# Patient Record
Sex: Male | Born: 1935
Health system: Southern US, Community
[De-identification: ages and names within clinical notes are randomized; demographics above are authoritative.]

## PROBLEM LIST (undated history)

## (undated) DIAGNOSIS — N400 Enlarged prostate without lower urinary tract symptoms: Secondary | ICD-10-CM

## (undated) DIAGNOSIS — N419 Inflammatory disease of prostate, unspecified: Secondary | ICD-10-CM

## (undated) DIAGNOSIS — Z972 Presence of dental prosthetic device (complete) (partial): Secondary | ICD-10-CM

## (undated) DIAGNOSIS — J45909 Unspecified asthma, uncomplicated: Secondary | ICD-10-CM

## (undated) DIAGNOSIS — K219 Gastro-esophageal reflux disease without esophagitis: Secondary | ICD-10-CM

## (undated) DIAGNOSIS — N390 Urinary tract infection, site not specified: Secondary | ICD-10-CM

## (undated) DIAGNOSIS — N411 Chronic prostatitis: Secondary | ICD-10-CM

## (undated) DIAGNOSIS — H18429 Band keratopathy, unspecified eye: Secondary | ICD-10-CM

## (undated) DIAGNOSIS — M1710 Unilateral primary osteoarthritis, unspecified knee: Secondary | ICD-10-CM

## (undated) DIAGNOSIS — I639 Cerebral infarction, unspecified: Secondary | ICD-10-CM

## (undated) DIAGNOSIS — I10 Essential (primary) hypertension: Secondary | ICD-10-CM

## (undated) DIAGNOSIS — G4733 Obstructive sleep apnea (adult) (pediatric): Secondary | ICD-10-CM

## (undated) DIAGNOSIS — N189 Chronic kidney disease, unspecified: Secondary | ICD-10-CM

## (undated) DIAGNOSIS — C4490 Unspecified malignant neoplasm of skin, unspecified: Secondary | ICD-10-CM

## (undated) DIAGNOSIS — H6690 Otitis media, unspecified, unspecified ear: Secondary | ICD-10-CM

## (undated) DIAGNOSIS — M25622 Stiffness of left elbow, not elsewhere classified: Secondary | ICD-10-CM

## (undated) DIAGNOSIS — E785 Hyperlipidemia, unspecified: Secondary | ICD-10-CM

## (undated) DIAGNOSIS — I4892 Unspecified atrial flutter: Secondary | ICD-10-CM

## (undated) DIAGNOSIS — I495 Sick sinus syndrome: Secondary | ICD-10-CM

## (undated) HISTORY — PX: BELPHAROPTOSIS REPAIR: SHX369

## (undated) HISTORY — PX: OTHER SURGICAL HISTORY: SHX169

## (undated) HISTORY — DX: Urinary tract infection, site not specified: N39.0

## (undated) HISTORY — DX: Inflammatory disease of prostate, unspecified: N41.9

## (undated) HISTORY — DX: Gastro-esophageal reflux disease without esophagitis: K21.9

## (undated) HISTORY — DX: Benign prostatic hyperplasia without lower urinary tract symptoms: N40.0

## (undated) HISTORY — DX: Unspecified atrial flutter: I48.92

## (undated) HISTORY — DX: Sick sinus syndrome: I49.5

## (undated) HISTORY — DX: Unilateral primary osteoarthritis, unspecified knee: M17.10

## (undated) HISTORY — DX: Essential (primary) hypertension: I10

## (undated) HISTORY — DX: Hyperlipidemia, unspecified: E78.5

## (undated) HISTORY — DX: Unspecified malignant neoplasm of skin, unspecified: C44.90

## (undated) HISTORY — DX: Chronic prostatitis: N41.1

## (undated) HISTORY — DX: Otitis media, unspecified, unspecified ear: H66.90

## (undated) HISTORY — PX: APPENDECTOMY: SHX54

## (undated) HISTORY — DX: Obstructive sleep apnea (adult) (pediatric): G47.33

## (undated) HISTORY — DX: Band keratopathy, unspecified eye: H18.429

## (undated) HISTORY — PX: VENOUS ABLATION: SHX2656

## (undated) HISTORY — PX: FRACTURE SURGERY: SHX138

## (undated) HISTORY — DX: Unspecified asthma, uncomplicated: J45.909

## (undated) HISTORY — PX: EYE SURGERY: SHX253

---

## 1996-01-04 HISTORY — PX: KNEE SURGERY: SHX244

## 2003-01-04 HISTORY — PX: SUBACROMIAL DECOMPRESSION: SHX5174

## 2004-01-04 LAB — HM COLONOSCOPY

## 2004-07-18 ENCOUNTER — Encounter: Payer: Self-pay | Admitting: Internal Medicine

## 2005-01-03 DIAGNOSIS — I4892 Unspecified atrial flutter: Secondary | ICD-10-CM

## 2005-01-03 HISTORY — DX: Unspecified atrial flutter: I48.92

## 2006-08-04 HISTORY — PX: MASTOIDECTOMY: SHX711

## 2007-01-04 HISTORY — PX: CATARACT EXTRACTION, BILATERAL: SHX1313

## 2008-05-03 HISTORY — PX: RHINOPLASTY: SUR1284

## 2009-10-01 ENCOUNTER — Encounter: Payer: Self-pay | Admitting: Cardiovascular Disease

## 2009-11-18 ENCOUNTER — Encounter: Payer: Self-pay | Admitting: Cardiovascular Disease

## 2010-03-18 ENCOUNTER — Encounter: Payer: Self-pay | Admitting: Cardiovascular Disease

## 2010-03-18 LAB — PROTIME-INR

## 2010-04-13 HISTORY — PX: CARDIOVERSION: SHX1299

## 2010-04-30 ENCOUNTER — Encounter: Payer: Self-pay | Admitting: Cardiovascular Disease

## 2010-04-30 LAB — PROTIME-INR

## 2010-06-03 ENCOUNTER — Encounter: Payer: Self-pay | Admitting: Cardiovascular Disease

## 2010-06-03 LAB — PROTIME-INR

## 2010-06-15 ENCOUNTER — Encounter: Payer: Self-pay | Admitting: Cardiovascular Disease

## 2010-06-15 LAB — PROTIME-INR

## 2010-06-25 ENCOUNTER — Encounter: Payer: Self-pay | Admitting: Internal Medicine

## 2010-06-25 ENCOUNTER — Ambulatory Visit (INDEPENDENT_AMBULATORY_CARE_PROVIDER_SITE_OTHER): Payer: Medicare Other | Admitting: Internal Medicine

## 2010-06-25 DIAGNOSIS — N401 Enlarged prostate with lower urinary tract symptoms: Secondary | ICD-10-CM | POA: Insufficient documentation

## 2010-06-25 DIAGNOSIS — I10 Essential (primary) hypertension: Secondary | ICD-10-CM

## 2010-06-25 DIAGNOSIS — M1710 Unilateral primary osteoarthritis, unspecified knee: Secondary | ICD-10-CM

## 2010-06-25 DIAGNOSIS — I495 Sick sinus syndrome: Secondary | ICD-10-CM | POA: Insufficient documentation

## 2010-06-25 DIAGNOSIS — E119 Type 2 diabetes mellitus without complications: Secondary | ICD-10-CM

## 2010-06-25 DIAGNOSIS — N138 Other obstructive and reflux uropathy: Secondary | ICD-10-CM | POA: Insufficient documentation

## 2010-06-25 DIAGNOSIS — J45909 Unspecified asthma, uncomplicated: Secondary | ICD-10-CM | POA: Insufficient documentation

## 2010-06-25 DIAGNOSIS — R7301 Impaired fasting glucose: Secondary | ICD-10-CM

## 2010-06-25 DIAGNOSIS — H6690 Otitis media, unspecified, unspecified ear: Secondary | ICD-10-CM

## 2010-06-25 DIAGNOSIS — N411 Chronic prostatitis: Secondary | ICD-10-CM

## 2010-06-25 DIAGNOSIS — H65499 Other chronic nonsuppurative otitis media, unspecified ear: Secondary | ICD-10-CM

## 2010-06-25 DIAGNOSIS — K219 Gastro-esophageal reflux disease without esophagitis: Secondary | ICD-10-CM | POA: Insufficient documentation

## 2010-06-25 LAB — CBC WITH DIFFERENTIAL/PLATELET
Basophils Absolute: 0 10*3/uL (ref 0.0–0.1)
Basophils Relative: 1 % (ref 0–1)
Eosinophils Absolute: 0.4 10*3/uL (ref 0.0–0.7)
Eosinophils Relative: 4 % (ref 0–5)
HCT: 47.4 % (ref 39.0–52.0)
Hemoglobin: 16.3 g/dL (ref 13.0–17.0)
Lymphocytes Relative: 17 % (ref 12–46)
Lymphs Abs: 1.5 10*3/uL (ref 0.7–4.0)
MCH: 31 pg (ref 26.0–34.0)
MCHC: 34.4 g/dL (ref 30.0–36.0)
MCV: 90.1 fL (ref 78.0–100.0)
Monocytes Absolute: 1.4 10*3/uL — ABNORMAL HIGH (ref 0.1–1.0)
Monocytes Relative: 16 % — ABNORMAL HIGH (ref 3–12)
Neutro Abs: 5.4 10*3/uL (ref 1.7–7.7)
Neutrophils Relative %: 62 % (ref 43–77)
Platelets: 138 10*3/uL — ABNORMAL LOW (ref 150–400)
RBC: 5.26 MIL/uL (ref 4.22–5.81)
RDW: 12.7 % (ref 11.5–15.5)
WBC: 8.8 10*3/uL (ref 4.0–10.5)

## 2010-06-25 LAB — HEMOGLOBIN A1C
Hgb A1c MFr Bld: 7.1 % — ABNORMAL HIGH (ref <5.7)
Mean Plasma Glucose: 157 mg/dL — ABNORMAL HIGH (ref <117)

## 2010-06-25 NOTE — Patient Instructions (Signed)
Please get your protime test for coumadin when you see Dr Mariah Milling

## 2010-06-25 NOTE — Progress Notes (Signed)
Subjective:    Patient ID: Michael Colon, male    DOB: 1930-10-02, 77 y.o.   MRN: [id number]  HPI Establishing here Just moved to [facility] from [place] area about 1 [place] ago [place]  Will be seeing Dr Mariah Milling Has tachy-brady syndrome Cardioverted after bradycardia, then tachy Atrial flutter at first so started on coumadin Being considered for pacemaker  HTN in past Has been controlled ---at first by 2 drugs Now just diuretic  Some GERD---gets laryngeal reflux Hoarseness and change in voice in past---okay with nighttime dosing only  History of asthma with occ flareups Has rescue inhaler of albuterol and advair in the past (like if he gets a cold and bronchitis flares)  Chronic prostatis in past Recurrent need for levaquin PSA spike to just over 10, had biopsy then which was negative  Broke left knee after fall14 years ago Repaired with plate but then had to remove it due to infection Repeat procedures--last 2010 Limits his moblity  No current outpatient prescriptions on file prior to visit.   Past Medical History  Diagnosis Date  . Tachy-brady syndrome   . Chronic prostatitis   . Obstructive sleep apnea     CPAP-9  . Chronic ear infection   . Asthma   . GERD (gastroesophageal reflux disease)     laryngeal involvement  . Hypertension   . Osteoarthrosis, localized, primary, knee     post-traumatic    Past Surgical History  Procedure Date  . Mastoidectomy 8/08    Dr [name]  . Rhinoplasty 5/10    and septoplasty  . Cataract extraction, bilateral 2009  . Belpharoptosis repair     Dr [name]---didn't resolve weepy eye and eyelid drooping  . Knee surgery 1998    plate after fracture, then removed for infection    No family history on file.  History   Social History  . Marital Status: Married    Spouse Name: N/A    Number of Children: 2  . Years of Education: N/A   Occupational History  . 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 Smoking status: Former Smoker    Quit date: 01/03/1969  . Smokeless tobacco: Never Used  . Alcohol Use: Yes     rare wine  . Drug Use: No  . Sexually Active: Not on file   Other Topics Concern  . Not on file   Social History Narrative   Has living willHealth care POA-- wife and then son Bryson Dames DNR order from the past---form redone 6/22/12Not sure about tube feedings   Review of Systems  Constitutional: Negative for fatigue and unexpected weight change.       Has lost about 10# in the recent move Wears seat belt  HENT: Positive for hearing loss and dental problem. Negative for congestion and rhinorrhea.        Just spent $21K for bridge  Eyes: Positive for discharge. Negative for visual disturbance.       No diplopia or focal vision loss  Respiratory: Negative for cough, chest tightness and shortness of breath.   Cardiovascular: Positive for palpitations and leg swelling. Negative for chest pain.  Gastrointestinal: Positive for constipation. Negative for nausea, vomiting and blood in stool.       Takes stool softener nightly No sig heartburn on Rx  Genitourinary: Positive for urgency. Negative for frequency and difficulty urinating.       Nocturia 1-2  Musculoskeletal: Positive for back pain  and arthralgias.       Feet have been sore---even good right foot lately Wears orthotics   Skin: Positive for rash.       Establishing with Dr Purcell Nails  Neurological: Negative for dizziness, syncope, weakness, light-headedness and headaches.  Hematological: Negative for adenopathy. Does not bruise/bleed easily.  Psychiatric/Behavioral: Negative for sleep disturbance and dysphoric mood. The patient is not nervous/anxious.        Objective:   Physical Exam  Constitutional: He is oriented to person, place, and time. He appears well-developed and well-nourished. No distress.  HENT:  Head: Normocephalic and atraumatic.  Left Ear: External ear normal.    Mouth/Throat: Oropharynx is clear and moist. No oropharyngeal exudate.       TM abnormal --opaque but no sig inflammation  Eyes: EOM are normal. Pupils are equal, round, and reactive to light.  Neck: Normal range of motion. Neck supple. No thyromegaly present.  Cardiovascular: Normal rate, regular rhythm, normal heart sounds and intact distal pulses.  Exam reveals no gallop.   No murmur heard.      Faint distal pulses  Pulmonary/Chest: Effort normal and breath sounds normal. No respiratory distress. He has no wheezes. He has no rales.  Abdominal: Soft. He exhibits no mass. There is no tenderness.  Musculoskeletal: Normal range of motion.       Puffy in ankles without pitting Mild redness along right instep with slight tenderness  Lymphadenopathy:    He has no cervical adenopathy.  Neurological: He is alert and oriented to person, place, and time. He exhibits normal muscle tone. Coordination normal.  Skin: Skin is warm and dry.  Psychiatric: He has a normal mood and affect. His behavior is normal. Judgment and thought content normal.          Assessment & Plan:

## 2010-06-25 NOTE — Assessment & Plan Note (Signed)
Somewhat disabling Handicapped form done No meds currently--will use ibuprofen prn

## 2010-06-25 NOTE — Assessment & Plan Note (Signed)
Good control No changes Will check labs

## 2010-06-25 NOTE — Assessment & Plan Note (Signed)
With LPR Does okay with bedtime famotidine

## 2010-06-25 NOTE — Assessment & Plan Note (Signed)
Advised no more PSAs Uses levaquin for infections when recur

## 2010-06-25 NOTE — Assessment & Plan Note (Signed)
Abnormal TM Recurrent infections Will probably establish with ENT here

## 2010-06-25 NOTE — Assessment & Plan Note (Signed)
With original atrial flutter Being considered for pacer but seems okay right now Will let Dr Mariah Milling reconsider need for coumadin

## 2010-06-25 NOTE — Assessment & Plan Note (Signed)
Generally quiet unless respiratory infection

## 2010-06-25 NOTE — Assessment & Plan Note (Signed)
Has been over 126 Will send for counselling if still that high

## 2010-06-26 LAB — BASIC METABOLIC PANEL
BUN: 22 mg/dL (ref 6–23)
CO2: 30 mEq/L (ref 19–32)
Calcium: 11 mg/dL — ABNORMAL HIGH (ref 8.4–10.5)
Chloride: 99 mEq/L (ref 96–112)
Creat: 0.87 mg/dL (ref 0.50–1.35)
Glucose, Bld: 165 mg/dL — ABNORMAL HIGH (ref 70–99)
Potassium: 4.2 mEq/L (ref 3.5–5.3)
Sodium: 138 mEq/L (ref 135–145)

## 2010-06-26 LAB — LIPID PANEL
Cholesterol: 191 mg/dL (ref 0–200)
HDL: 52 mg/dL (ref >39)
LDL Cholesterol: 96 mg/dL (ref 0–99)
Total CHOL/HDL Ratio: 3.7 Ratio
Triglycerides: 215 mg/dL — ABNORMAL HIGH (ref <150)
VLDL: 43 mg/dL — ABNORMAL HIGH (ref 0–40)

## 2010-06-26 LAB — HEPATIC FUNCTION PANEL
ALT: 16 U/L (ref 0–53)
AST: 20 U/L (ref 0–37)
Albumin: 3.9 g/dL (ref 3.5–5.2)
Alkaline Phosphatase: 67 U/L (ref 39–117)
Bilirubin, Direct: 0.1 mg/dL (ref 0.0–0.3)
Indirect Bilirubin: 0.6 mg/dL (ref 0.0–0.9)
Total Bilirubin: 0.7 mg/dL (ref 0.3–1.2)
Total Protein: 6.7 g/dL (ref 6.0–8.3)

## 2010-06-28 ENCOUNTER — Encounter: Payer: Self-pay | Admitting: Internal Medicine

## 2010-06-28 DIAGNOSIS — E1140 Type 2 diabetes mellitus with diabetic neuropathy, unspecified: Secondary | ICD-10-CM | POA: Insufficient documentation

## 2010-06-28 NOTE — Progress Notes (Signed)
Addended by: Tillman Abide I on: 06/28/2010 01:55 PM   Modules accepted: Orders

## 2010-06-28 NOTE — Progress Notes (Signed)
Referral to [facility] Referral faxed today.  cdavis 06-28-2010

## 2010-06-30 ENCOUNTER — Encounter: Payer: Self-pay | Admitting: *Deleted

## 2010-07-05 ENCOUNTER — Encounter: Payer: Self-pay | Admitting: Cardiovascular Disease

## 2010-07-05 ENCOUNTER — Ambulatory Visit (INDEPENDENT_AMBULATORY_CARE_PROVIDER_SITE_OTHER): Payer: Medicare Other | Admitting: Cardiovascular Disease

## 2010-07-05 DIAGNOSIS — E119 Type 2 diabetes mellitus without complications: Secondary | ICD-10-CM

## 2010-07-05 DIAGNOSIS — Z7901 Long term (current) use of anticoagulants: Secondary | ICD-10-CM

## 2010-07-05 DIAGNOSIS — E785 Hyperlipidemia, unspecified: Secondary | ICD-10-CM

## 2010-07-05 DIAGNOSIS — I10 Essential (primary) hypertension: Secondary | ICD-10-CM

## 2010-07-05 DIAGNOSIS — I4891 Unspecified atrial fibrillation: Secondary | ICD-10-CM

## 2010-07-05 DIAGNOSIS — I495 Sick sinus syndrome: Secondary | ICD-10-CM

## 2010-07-05 NOTE — Assessment & Plan Note (Signed)
We have recommended that he try to lose weight over the next several months with a check of his cholesterol at that time. If he is able to lose weight, we could hold off on starting a cholesterol medication.

## 2010-07-05 NOTE — Progress Notes (Signed)
   Patient ID: Michael Colon, male    DOB: 05-17-1930, 77 y.o.   MRN: [id number]  HPI Comments: Michael Colon is a very pleasant 77 year old gentleman with a history of atrial flutter/fibrillation, who recently moved to the area who presents to establish care. He has sleep apnea and wears CPAP, has obesity, hypertension. Notes indicate a history of sick sinus syndrome and bradycardia. He was previously followed at [facility] cardiology by Dr. Bari Edward. Symptoms of atrial fibrillation started back in 2006. Bradycardia has been documented on Holter monitor since then. He has had a cardioversion April of this year with subsequent EKG showing bradycardia.  Most recent note from [facility] cardiology from February indicates that he had a cardioversion April 13, 2010 for atrial flutter with 2:1 conduction. Notes from February indicate that he had an atrial tachycardia and cardioversion was discussed. No medications were started at that time although antiarrhythmics have been discussed. He was about to move and further medication changes were delayed.  Since he has moved, has felt well. He denies any lightheadedness or dizziness. He is somewhat sleepy during the daytime. He exercises daily and carries a pedometer and tries to achieve 10,000 steps per day.  Previous echocardiogram showed normal systolic function, stress test showed no significant ischemia with adequate heart rate response to treadmill. The studies were done in September 2011.  EKG shows atrial fibrillation with ventricular rate 72 beats per minute, right bundle branch block      Review of Systems  Constitutional: Positive for fatigue.  HENT: Negative.   Eyes: Negative.   Respiratory: Negative.   Cardiovascular: Negative.   Gastrointestinal: Negative.   Musculoskeletal: Negative.   Skin: Negative.   Neurological: Negative.   Hematological: Negative.   Psychiatric/Behavioral: Negative.   All other systems reviewed and are  negative.    BP 118/82  Pulse 72  Ht 5\' 11"  (1.803 m)  Wt 235 lb (106.595 kg)  BMI 32.78 kg/m2  Physical Exam  Nursing note and vitals reviewed. Constitutional: He is oriented to person, place, and time. He appears well-developed and well-nourished.       obese  HENT:  Head: Normocephalic.  Nose: Nose normal.  Mouth/Throat: Oropharynx is clear and moist.  Eyes: Conjunctivae are normal. Pupils are equal, round, and reactive to light.  Neck: Normal range of motion. Neck supple. No JVD present.  Cardiovascular: Normal rate, regular rhythm, S1 normal, S2 normal, normal heart sounds and intact distal pulses.  Exam reveals no gallop and no friction rub.   No murmur heard. Pulmonary/Chest: Effort normal and breath sounds normal. No respiratory distress. He has no wheezes. He has no rales. He exhibits no tenderness.  Abdominal: Soft. Bowel sounds are normal. He exhibits no distension. There is no tenderness.  Musculoskeletal: Normal range of motion. He exhibits no edema and no tenderness.       Well-healed scars on his left leg below the knee, trace edema nonpitting  Lymphadenopathy:    He has no cervical adenopathy.  Neurological: He is alert and oriented to person, place, and time. Coordination normal.  Skin: Skin is warm and dry. No rash noted. No erythema.  Psychiatric: He has a normal mood and affect. His behavior is normal. Judgment and thought content normal.           Assessment and Plan

## 2010-07-05 NOTE — Assessment & Plan Note (Addendum)
6 years of  tachyarrhythmias including atrial fibrillation and flutter. Currently is maintaining atrial fibrillation with no significant symptoms. Have asked him to monitor his heart rate at home with his portable monitor. He denies any recent bradycardia, rates in the 60s to 70s. His energy is adequate and he is able to exercise though he does sleep during the daytime. I suggested given his problems with chronic tachyarrhythmia, I would not plan for any antiarrhythmics or cardioversion at this time. He is on warfarin.  INR in March was 2.4, 2.2 in April 27, 1.4 in May 31, 2.1 on June 12

## 2010-07-05 NOTE — Patient Instructions (Addendum)
You are doing well. No medication changes were made. Please call us if you have new issues that need to be addressed before your next appt.   You are scheduled with our coumadin clinic 07/21/10 @ 9:00 AM We will call you for a follow up Appt. In 6 months

## 2010-07-05 NOTE — Assessment & Plan Note (Signed)
Blood pressure is well controlled on today's visit. No changes made to the medications. 

## 2010-07-05 NOTE — Assessment & Plan Note (Signed)
We have encouraged continued exercise, careful diet management in an effort to lose weight. 

## 2010-07-09 ENCOUNTER — Ambulatory Visit: Payer: Self-pay | Admitting: Internal Medicine

## 2010-07-14 ENCOUNTER — Encounter: Payer: Medicare Other | Admitting: Emergency Medicine

## 2010-07-16 ENCOUNTER — Other Ambulatory Visit: Payer: Self-pay | Admitting: *Deleted

## 2010-07-16 MED ORDER — GLUCOSE BLOOD VI STRP: ORAL_STRIP | Status: AC

## 2010-07-16 MED ORDER — BAYER MICROLET LANCETS MISC: Status: AC

## 2010-07-21 ENCOUNTER — Ambulatory Visit (INDEPENDENT_AMBULATORY_CARE_PROVIDER_SITE_OTHER): Payer: Medicare Other | Admitting: Emergency Medicine

## 2010-07-21 DIAGNOSIS — I4892 Unspecified atrial flutter: Secondary | ICD-10-CM | POA: Insufficient documentation

## 2010-07-21 DIAGNOSIS — Z7901 Long term (current) use of anticoagulants: Secondary | ICD-10-CM

## 2010-07-21 DIAGNOSIS — Z7189 Other specified counseling: Secondary | ICD-10-CM | POA: Insufficient documentation

## 2010-07-21 HISTORY — DX: Other specified counseling: Z71.89

## 2010-07-21 LAB — POCT INR: INR: 2.7

## 2010-07-23 ENCOUNTER — Telehealth: Payer: Self-pay | Admitting: *Deleted

## 2010-07-23 NOTE — Telephone Encounter (Signed)
Asking to have another DNR filled out, they need an original for [facility]. Will pick up on Monday.

## 2010-07-26 NOTE — Telephone Encounter (Signed)
Left message on machine that DNR is ready for pick-up, and it will be at our front desk.

## 2010-07-26 NOTE — Telephone Encounter (Signed)
Written Please scan for chart and then okay for pickup

## 2010-08-04 ENCOUNTER — Encounter: Payer: Self-pay | Admitting: Cardiovascular Disease

## 2010-08-04 ENCOUNTER — Telehealth: Payer: Self-pay | Admitting: *Deleted

## 2010-08-04 ENCOUNTER — Observation Stay: Payer: Self-pay | Admitting: Internal Medicine

## 2010-08-04 DIAGNOSIS — R079 Chest pain, unspecified: Secondary | ICD-10-CM

## 2010-08-04 DIAGNOSIS — I4891 Unspecified atrial fibrillation: Secondary | ICD-10-CM

## 2010-08-04 HISTORY — PX: OTHER SURGICAL HISTORY: SHX169

## 2010-08-04 NOTE — Telephone Encounter (Signed)
Unfortunately, he needs to go to the ER. This would not be an appropriate visit for my clinic at [facility] as I am limited as to what I can do there. This may be cardiac related.

## 2010-08-04 NOTE — Telephone Encounter (Signed)
Patient's wife was advised at 9:35 a.m today.  I did not get it documented until now.

## 2010-08-04 NOTE — Telephone Encounter (Signed)
Patient's wife says patient got up this morning stating that he did not feel right.  He is hurting in his chest, up around his neck area and it seems to worsen when he bends over.  It started last night but he did not report it to her until this morning.  They are at [facility] and wish to see you this afternoon at the clinic but they can't reach anyone to schedule an appt there.  Should they call  911 or can they just walk in to the clinic this afternoon.  She says she doesn't think it warrants a 911 call but would like to know what you think.

## 2010-08-05 ENCOUNTER — Encounter: Payer: Self-pay | Admitting: Cardiovascular Disease

## 2010-08-06 ENCOUNTER — Telehealth: Payer: Self-pay | Admitting: *Deleted

## 2010-08-06 ENCOUNTER — Ambulatory Visit: Payer: Self-pay | Admitting: Internal Medicine

## 2010-08-06 MED ORDER — OMEPRAZOLE MAGNESIUM 20 MG PO TBEC: 20.0000 mg | DELAYED_RELEASE_TABLET | Freq: Every day | ORAL | Status: DC

## 2010-08-06 NOTE — Telephone Encounter (Signed)
Patient was recently discharged from the hospital and has a follow up appt with Dr. Alphonsus Sias in 2 weeks as recommended.  He was told to start Prilosec (delayed release) 20 mg daily.  It is cheaper for them to get this through a Rx rather than OTC.  Is it okay to send a Rx ?   They would like to get started with this before the 2 week follow up.   Uses [facility] [address].

## 2010-08-06 NOTE — Telephone Encounter (Signed)
Yes that is ok

## 2010-08-06 NOTE — Telephone Encounter (Signed)
Patients spouse advised as instructed via telephone.  Rx sent to pharmacy.

## 2010-08-17 ENCOUNTER — Encounter: Payer: Self-pay | Admitting: Internal Medicine

## 2010-08-17 ENCOUNTER — Ambulatory Visit (INDEPENDENT_AMBULATORY_CARE_PROVIDER_SITE_OTHER): Payer: Medicare Other | Admitting: Internal Medicine

## 2010-08-17 DIAGNOSIS — I10 Essential (primary) hypertension: Secondary | ICD-10-CM

## 2010-08-17 DIAGNOSIS — E1169 Type 2 diabetes mellitus with other specified complication: Secondary | ICD-10-CM

## 2010-08-17 DIAGNOSIS — E669 Obesity, unspecified: Secondary | ICD-10-CM

## 2010-08-17 DIAGNOSIS — E119 Type 2 diabetes mellitus without complications: Secondary | ICD-10-CM

## 2010-08-17 DIAGNOSIS — K219 Gastro-esophageal reflux disease without esophagitis: Secondary | ICD-10-CM

## 2010-08-17 NOTE — Assessment & Plan Note (Signed)
Apparent esophageal spasm Will continue PPI Will consider stopping famotidine at the next visit

## 2010-08-17 NOTE — Assessment & Plan Note (Signed)
Has done counselling Walking bid aggressively Will recheck labs at October visit Checks sugars every other day

## 2010-08-17 NOTE — Progress Notes (Signed)
Subjective:    Patient ID: Michael Colon, male    DOB: Mar 24, 1930, 77 y.o.   MRN: [id number]  HPI Hospitalized for upper chest pain Actually points to windpipe with pressure sensation and SOB Worse when bending over  Started on omperazole in hospital The sensation had abated by that time No recurrence of pain No SOB No water brash  Had reassuring stress test  Still finishing with diabetic training Has increased his work outs---walking a lot every day  Current Outpatient Prescriptions on File Prior to Visit  Medication Sig Dispense Refill  . albuterol (VENTOLIN HFA) 108 (90 BASE) MCG/ACT inhaler Inhale 2 puffs into the lungs every 6 (six) hours as needed.        Marland Kitchen aspirin 81 MG tablet Take 81 mg by mouth daily.        Marland Kitchen BAYER MICROLET LANCETS lancets Use as instructed  100 each  12  . Calcium Carbonate-Vitamin D (CALCIUM-CARB 600 + D) 600-125 MG-UNIT TABS Take by mouth.        . docusate sodium (COLACE) 100 MG capsule Take 100 mg by mouth 1 dose over 24 hours.       . famotidine (PEPCID) 20 MG tablet Take 20 mg by mouth 1 dose over 24 hours.       . finasteride (PROSCAR) 5 MG tablet Take 5 mg by mouth daily.        . fish oil-omega-3 fatty acids 1000 MG capsule Take 2 g by mouth daily.        . Fluticasone-Salmeterol (ADVAIR DISKUS) 100-50 MCG/DOSE AEPB Inhale 1 puff into the lungs every 12 (twelve) hours. To be used when bronchitis flaring up       . glucose blood (BAYER CONTOUR TEST) test strip Use as instructed  100 each  12  . loratadine (CLARITIN) 10 MG tablet Take 10 mg by mouth daily.        . Multiple Vitamins-Minerals (CENTRUM SILVER PO) Take by mouth daily after breakfast.        . omeprazole (PRILOSEC OTC) 20 MG tablet Take 1 tablet (20 mg total) by mouth daily.  30 tablet  0  . triamterene-hydrochlorothiazide (MAXZIDE) 75-50 MG per tablet Take 1 tablet by mouth daily.        Marland Kitchen warfarin (COUMADIN) 7.5 MG tablet Take 7.5 mg by mouth as directed.           Allergies  Allergen Reactions  . Latex Rash  . Lovenox Hives  . Percocet (Oxycodone-Acetaminophen) Rash    Past Medical History  Diagnosis Date  . Chronic prostatitis   . Obstructive sleep apnea     CPAP-9  . Chronic ear infection   . Asthma   . GERD (gastroesophageal reflux disease)     laryngeal involvement  . Hypertension   . Osteoarthrosis, localized, primary, knee     post-traumatic  . Diabetes mellitus   . Tachy-brady syndrome   . A-fib 2007    Past Surgical History  Procedure Date  . Mastoidectomy 8/08    Dr [name]  . Rhinoplasty 5/10    and septoplasty  . Cataract extraction, bilateral 2009  . Belpharoptosis repair     Dr [name]---didn't resolve weepy eye and eyelid drooping  . Knee surgery 1998    plate after fracture, then removed for infection  . Cardioversion 04/13/2010  . Chest pain 8/12    Stress test benign    No family history on file.  History   Social History  .  Marital Status: Married    Spouse Name: N/A    Number of Children: 2  . 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 Smoking status: Former Smoker -- 1.0 packs/day for 0 years    Types: Cigarettes    Quit date: 01/03/1969  . Smokeless tobacco: Never Used  . Alcohol Use: 0.6 oz/week    1 Glasses of wine per week     rare wine  . Drug Use: No  . Sexually Active: Not on file   Other Topics Concern  . Not on file   Social History Narrative   Has living willHealth care POA-- wife and then son Bryson Dames DNR order from the past---form redone 6/22/12Not sure about tube feedings   Review of Systems  Sleeping well No sig edema    Objective:   Physical Exam  Constitutional: He appears well-developed and well-nourished. No distress.  Neck: Normal range of motion. Neck supple. No thyromegaly present.  Cardiovascular: Normal rate, regular rhythm, normal heart sounds and intact distal pulses.  Exam  reveals no gallop.   No murmur heard. Pulmonary/Chest: Effort normal and breath sounds normal. No respiratory distress. He has no wheezes. He has no rales.  Abdominal: Soft. There is no tenderness.  Musculoskeletal: He exhibits edema.       Trace edema  Lymphadenopathy:    He has no cervical adenopathy.  Psychiatric: He has a normal mood and affect. His behavior is normal. Judgment and thought content normal.          Assessment & Plan:

## 2010-08-17 NOTE — Patient Instructions (Addendum)
Please cancel the 8/22 appointment but keep the October appt

## 2010-08-17 NOTE — Assessment & Plan Note (Signed)
BP Readings from Last 3 Encounters:  08/17/10 112/68  07/05/10 118/82  06/25/10 126/68   Good control No changes

## 2010-08-18 ENCOUNTER — Encounter: Payer: Medicare Other | Admitting: Emergency Medicine

## 2010-08-23 ENCOUNTER — Encounter: Payer: Self-pay | Admitting: Cardiovascular Disease

## 2010-08-25 ENCOUNTER — Ambulatory Visit: Payer: Medicare Other | Admitting: Internal Medicine

## 2010-09-01 ENCOUNTER — Ambulatory Visit (INDEPENDENT_AMBULATORY_CARE_PROVIDER_SITE_OTHER): Payer: Medicare Other | Admitting: Emergency Medicine

## 2010-09-01 DIAGNOSIS — I4891 Unspecified atrial fibrillation: Secondary | ICD-10-CM

## 2010-09-01 DIAGNOSIS — Z7901 Long term (current) use of anticoagulants: Secondary | ICD-10-CM

## 2010-09-01 LAB — POCT INR: INR: 2.5

## 2010-09-04 ENCOUNTER — Ambulatory Visit: Payer: Self-pay | Admitting: Internal Medicine

## 2010-09-29 ENCOUNTER — Encounter: Payer: Medicare Other | Admitting: Emergency Medicine

## 2010-09-29 ENCOUNTER — Ambulatory Visit (INDEPENDENT_AMBULATORY_CARE_PROVIDER_SITE_OTHER): Payer: Medicare Other | Admitting: Emergency Medicine

## 2010-09-29 DIAGNOSIS — I4891 Unspecified atrial fibrillation: Secondary | ICD-10-CM

## 2010-09-29 DIAGNOSIS — Z7901 Long term (current) use of anticoagulants: Secondary | ICD-10-CM

## 2010-09-29 LAB — POCT INR: INR: 2.2

## 2010-10-05 ENCOUNTER — Other Ambulatory Visit: Payer: Self-pay | Admitting: Family Medicine

## 2010-10-12 ENCOUNTER — Ambulatory Visit (INDEPENDENT_AMBULATORY_CARE_PROVIDER_SITE_OTHER): Payer: Medicare Other | Admitting: Internal Medicine

## 2010-10-12 ENCOUNTER — Encounter: Payer: Self-pay | Admitting: Internal Medicine

## 2010-10-12 ENCOUNTER — Encounter: Payer: Self-pay | Admitting: *Deleted

## 2010-10-12 VITALS — BP 133/71 | HR 65 | Temp 97.9°F | Ht 71.0 in | Wt 226.0 lb

## 2010-10-12 DIAGNOSIS — E1169 Type 2 diabetes mellitus with other specified complication: Secondary | ICD-10-CM

## 2010-10-12 DIAGNOSIS — N419 Inflammatory disease of prostate, unspecified: Secondary | ICD-10-CM

## 2010-10-12 DIAGNOSIS — I10 Essential (primary) hypertension: Secondary | ICD-10-CM

## 2010-10-12 DIAGNOSIS — I4892 Unspecified atrial flutter: Secondary | ICD-10-CM

## 2010-10-12 DIAGNOSIS — E669 Obesity, unspecified: Secondary | ICD-10-CM

## 2010-10-12 DIAGNOSIS — E119 Type 2 diabetes mellitus without complications: Secondary | ICD-10-CM

## 2010-10-12 LAB — POCT URINALYSIS DIPSTICK
Bilirubin, UA: NEGATIVE
Blood, UA: NEGATIVE
Glucose, UA: NEGATIVE
Ketones, UA: NEGATIVE
Leukocytes, UA: NEGATIVE
Nitrite, UA: NEGATIVE
Protein, UA: NEGATIVE
Spec Grav, UA: 1.01
Urobilinogen, UA: 0.2
pH, UA: 7

## 2010-10-12 LAB — HEMOGLOBIN A1C: Hgb A1c MFr Bld: 6.4 % (ref 4.6–6.5)

## 2010-10-12 MED ORDER — CIPROFLOXACIN HCL 250 MG PO TABS: 250.0000 mg | ORAL_TABLET | Freq: Two times a day (BID) | ORAL | Status: AC

## 2010-10-12 NOTE — Patient Instructions (Signed)
Please set up protime for next Monday (due to being on antibiotic) Please cancel 10/24 appt

## 2010-10-12 NOTE — Assessment & Plan Note (Addendum)
Has had recurrent problems with this Will treat with cipro for 4 weeks  Check protime next week and adjust as needed Urinalysis was normal

## 2010-10-12 NOTE — Assessment & Plan Note (Signed)
BP Readings from Last 3 Encounters:  10/12/10 133/71  08/17/10 112/68  07/05/10 118/82   Good control No changes Lab Results  Component Value Date   CREATININE 0.87 06/25/2010

## 2010-10-12 NOTE — Assessment & Plan Note (Signed)
Seems to have excellent control Will recheck A1c

## 2010-10-12 NOTE — Assessment & Plan Note (Signed)
Continues on  Coumadin Heart rate is good

## 2010-10-12 NOTE — Progress Notes (Signed)
Subjective:    Patient ID: Michael Colon, male    DOB: 07/08/1930, 77 y.o.   MRN: [id number]  HPI Up 4 times last night to void History of intermittent prostatitis---had that same mild suprapubic pain and full rectum when nothing was there No fever No dysuria---but starting to feel different No hematuria  Did have prostate biopsy 12/06 for elevated PSA Just showed inflammation Spell in October 2008---Rx for levaquin then. Needed 4 weeks Later in 2008, had cystoscopy for hematuria----just had prostate bleeding Maintains yearly follow up at [facility] blood sugar every morning 83-133 Mostly under 120  BP 101/68- 136/76 Only higher than this twice in 1 month  Current Outpatient Prescriptions on File Prior to Visit  Medication Sig Dispense Refill  . albuterol (VENTOLIN HFA) 108 (90 BASE) MCG/ACT inhaler Inhale 2 puffs into the lungs every 6 (six) hours as needed.        Marland Kitchen aspirin 81 MG tablet Take 81 mg by mouth daily.        Marland Kitchen BAYER MICROLET LANCETS lancets Use as instructed  100 each  12  . Calcium Carbonate-Vitamin D (CALCIUM-CARB 600 + D) 600-125 MG-UNIT TABS Take by mouth.        . docusate sodium (COLACE) 100 MG capsule Take 100 mg by mouth 1 dose over 24 hours.       . famotidine (PEPCID) 20 MG tablet Take 20 mg by mouth 1 dose over 24 hours.       . finasteride (PROSCAR) 5 MG tablet Take 5 mg by mouth daily.        . fish oil-omega-3 fatty acids 1000 MG capsule Take 2 g by mouth daily.        . Fluticasone-Salmeterol (ADVAIR DISKUS) 100-50 MCG/DOSE AEPB Inhale 1 puff into the lungs every 12 (twelve) hours. To be used when bronchitis flaring up       . glucose blood (BAYER CONTOUR TEST) test strip Use as instructed  100 each  12  . loratadine (CLARITIN) 10 MG tablet Take 10 mg by mouth daily.        . Multiple Vitamins-Minerals (CENTRUM SILVER PO) Take by mouth daily after breakfast.        . omeprazole (PRILOSEC) 20 MG capsule TAKE 1 CAPSULE EVERY DAY AS DIRECTED   30 capsule  6  . triamterene-hydrochlorothiazide (MAXZIDE) 75-50 MG per tablet Take 1 tablet by mouth daily.        Marland Kitchen warfarin (COUMADIN) 7.5 MG tablet Take 7.5 mg by mouth as directed.          Allergies  Allergen Reactions  . Latex Rash  . Lovenox Hives  . Percocet (Oxycodone-Acetaminophen) Rash    Past Medical History  Diagnosis Date  . Chronic prostatitis   . Obstructive sleep apnea     CPAP-9  . Chronic ear infection   . Asthma   . GERD (gastroesophageal reflux disease)     laryngeal involvement  . Hypertension   . Osteoarthrosis, localized, primary, knee     post-traumatic  . Diabetes mellitus   . Tachy-brady syndrome   . A-fib 2007    Past Surgical History  Procedure Date  . Mastoidectomy 8/08    Dr [name]  . Rhinoplasty 5/10    and septoplasty  . Cataract extraction, bilateral 2009  . Belpharoptosis repair     Dr [name]---didn't resolve weepy eye and eyelid drooping  . Knee surgery 1998    plate after fracture, then removed for infection  .  Cardioversion 04/13/2010  . Chest pain 8/12    Stress test benign    No family history on file.  History   Social History  . Marital Status: Married    Spouse Name: N/A    Number of Children: 2  . 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 Smoking status: Former Smoker -- 1.0 packs/day for 0 years    Types: Cigarettes    Quit date: 01/03/1969  . Smokeless tobacco: Never Used  . Alcohol Use: 0.6 oz/week    1 Glasses of wine per week     rare wine  . Drug Use: No  . Sexually Active: Not on file   Other Topics Concern  . Not on file   Social History Narrative   Has living willHealth care POA-- wife and then son Bryson Dames DNR order from the past---form redone 6/22/12Not sure about tube feedings    Review of Systems No nausea No vomiting Eating okay Using ankle brace from Dr Orland Jarred Trying to walk regularly but has had to cut  down some    Objective:   Physical Exam  Constitutional: He appears well-developed and well-nourished. No distress.  Neck: Normal range of motion. Neck supple.  Cardiovascular: Normal rate, regular rhythm and normal heart sounds.  Exam reveals no gallop.   No murmur heard. Pulmonary/Chest: Effort normal and breath sounds normal. No respiratory distress. He has no wheezes. He has no rales.  Abdominal: Soft. There is no tenderness.       No suprapubic dullness  Genitourinary: Rectum normal.       Mildly enlarged symmetric prostate without nodule Mild right sided tenderness  Lymphadenopathy:    He has no cervical adenopathy.          Assessment & Plan:

## 2010-10-18 ENCOUNTER — Ambulatory Visit (INDEPENDENT_AMBULATORY_CARE_PROVIDER_SITE_OTHER): Payer: Medicare Other | Admitting: *Deleted

## 2010-10-18 DIAGNOSIS — I4891 Unspecified atrial fibrillation: Secondary | ICD-10-CM

## 2010-10-18 DIAGNOSIS — Z7901 Long term (current) use of anticoagulants: Secondary | ICD-10-CM

## 2010-10-18 LAB — POCT INR: INR: 1.7

## 2010-10-22 ENCOUNTER — Telehealth: Payer: Self-pay | Admitting: Internal Medicine

## 2010-10-22 NOTE — Telephone Encounter (Signed)
Kim from Dr. Recardo Evangelist office called, left message at 4:30pm. I called back, Kim had left for the day. Message to [facility], to contact pt ton Monday...cdavis 10-22-2010

## 2010-10-27 ENCOUNTER — Ambulatory Visit (INDEPENDENT_AMBULATORY_CARE_PROVIDER_SITE_OTHER): Payer: Medicare Other | Admitting: Emergency Medicine

## 2010-10-27 ENCOUNTER — Encounter: Payer: Medicare Other | Admitting: Emergency Medicine

## 2010-10-27 ENCOUNTER — Ambulatory Visit: Payer: BC Managed Care – PPO | Admitting: Internal Medicine

## 2010-10-27 DIAGNOSIS — I4892 Unspecified atrial flutter: Secondary | ICD-10-CM

## 2010-10-27 DIAGNOSIS — Z7901 Long term (current) use of anticoagulants: Secondary | ICD-10-CM

## 2010-10-27 DIAGNOSIS — Z0289 Encounter for other administrative examinations: Secondary | ICD-10-CM

## 2010-10-27 DIAGNOSIS — I4891 Unspecified atrial fibrillation: Secondary | ICD-10-CM

## 2010-10-27 LAB — POCT INR: INR: 2

## 2010-11-10 ENCOUNTER — Ambulatory Visit (INDEPENDENT_AMBULATORY_CARE_PROVIDER_SITE_OTHER): Payer: Medicare Other | Admitting: Emergency Medicine

## 2010-11-10 DIAGNOSIS — I4892 Unspecified atrial flutter: Secondary | ICD-10-CM

## 2010-11-10 DIAGNOSIS — I4891 Unspecified atrial fibrillation: Secondary | ICD-10-CM

## 2010-11-10 DIAGNOSIS — Z7901 Long term (current) use of anticoagulants: Secondary | ICD-10-CM

## 2010-11-10 LAB — POCT INR: INR: 1.9

## 2010-11-18 ENCOUNTER — Ambulatory Visit: Payer: Medicare Other | Admitting: Family Medicine

## 2010-11-18 ENCOUNTER — Ambulatory Visit (INDEPENDENT_AMBULATORY_CARE_PROVIDER_SITE_OTHER): Payer: Medicare Other | Admitting: Internal Medicine

## 2010-11-18 ENCOUNTER — Encounter: Payer: Self-pay | Admitting: Internal Medicine

## 2010-11-18 VITALS — BP 110/60 | HR 57 | Temp 97.8°F | Ht 71.0 in | Wt 228.0 lb

## 2010-11-18 DIAGNOSIS — R300 Dysuria: Secondary | ICD-10-CM

## 2010-11-18 DIAGNOSIS — N419 Inflammatory disease of prostate, unspecified: Secondary | ICD-10-CM

## 2010-11-18 LAB — POCT URINALYSIS DIPSTICK
Bilirubin, UA: NEGATIVE
Glucose, UA: NEGATIVE
Ketones, UA: NEGATIVE
Nitrite, UA: NEGATIVE
Spec Grav, UA: 1.015
Urobilinogen, UA: 0.2
pH, UA: 7

## 2010-11-18 MED ORDER — LEVOFLOXACIN 500 MG PO TABS: 500.0000 mg | ORAL_TABLET | Freq: Every day | ORAL | Status: AC

## 2010-11-18 NOTE — Progress Notes (Signed)
Subjective:    Patient ID: Michael Colon, male    DOB: 06-04-1930, 77 y.o.   MRN: [id number]  HPI Symptoms got better Finished med and the pain came back Awakens feeling okay, then dragging by afternoon  Some dysuria Creamy urethral discharge Some lower abdominal aching Feels tight in rectum when stool there  Chronic problems Has needed 6 weeks or more of Rx in past Doxy didn't work  levaquin did help the last time (several years ago)  Current Outpatient Prescriptions on File Prior to Visit  Medication Sig Dispense Refill  . albuterol (VENTOLIN HFA) 108 (90 BASE) MCG/ACT inhaler Inhale 2 puffs into the lungs every 6 (six) hours as needed.        Marland Kitchen aspirin 81 MG tablet Take 81 mg by mouth daily.        Marland Kitchen BAYER MICROLET LANCETS lancets Use as instructed  100 each  12  . Calcium Carbonate-Vitamin D (CALCIUM-CARB 600 + D) 600-125 MG-UNIT TABS Take by mouth.        . docusate sodium (COLACE) 100 MG capsule Take 100 mg by mouth 1 dose over 24 hours.       . famotidine (PEPCID) 20 MG tablet Take 20 mg by mouth 1 dose over 24 hours.       . finasteride (PROSCAR) 5 MG tablet Take 5 mg by mouth daily.        . fish oil-omega-3 fatty acids 1000 MG capsule Take 2 g by mouth daily.        . Fluticasone-Salmeterol (ADVAIR DISKUS) 100-50 MCG/DOSE AEPB Inhale 1 puff into the lungs every 12 (twelve) hours. To be used when bronchitis flaring up       . glucose blood (BAYER CONTOUR TEST) test strip Use as instructed  100 each  12  . loratadine (CLARITIN) 10 MG tablet Take 10 mg by mouth daily.        . Multiple Vitamins-Minerals (CENTRUM SILVER PO) Take by mouth daily after breakfast.        . omeprazole (PRILOSEC) 20 MG capsule TAKE 1 CAPSULE EVERY DAY AS DIRECTED  30 capsule  6  . triamterene-hydrochlorothiazide (MAXZIDE) 75-50 MG per tablet Take 1 tablet by mouth daily.        Marland Kitchen warfarin (COUMADIN) 7.5 MG tablet Take 7.5 mg by mouth as directed.          Allergies  Allergen Reactions    . Latex Rash  . Lovenox Hives  . Percocet (Oxycodone-Acetaminophen) Rash    Past Medical History  Diagnosis Date  . Chronic prostatitis   . Obstructive sleep apnea     CPAP-9  . Chronic ear infection   . Asthma   . GERD (gastroesophageal reflux disease)     laryngeal involvement  . Hypertension   . Osteoarthrosis, localized, primary, knee     post-traumatic  . Diabetes mellitus   . Tachy-brady syndrome   . A-fib 2007    Past Surgical History  Procedure Date  . Mastoidectomy 8/08    Dr [name]  . Rhinoplasty 5/10    and septoplasty  . Cataract extraction, bilateral 2009  . Belpharoptosis repair     Dr [name]---didn't resolve weepy eye and eyelid drooping  . Knee surgery 1998    plate after fracture, then removed for infection  . Cardioversion 04/13/2010  . Chest pain 8/12    Stress test benign    No family history on file.  History   Social History  . Marital  Status: Married    Spouse Name: N/A    Number of Children: 2  . 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 Smoking status: Former Smoker -- 1.0 packs/day for 0 years    Types: Cigarettes    Quit date: 01/03/1969  . Smokeless tobacco: Never Used  . Alcohol Use: 0.6 oz/week    1 Glasses of wine per week     rare wine  . Drug Use: No  . Sexually Active: Not on file   Other Topics Concern  . Not on file   Social History Narrative   Has living willHealth care POA-- wife and then son Bryson Dames DNR order from the past---form redone 6/22/12Not sure about tube feedings   Review of Systems Urine orangey at times No fever No hematuria No stomach troubles     Objective:   Physical Exam  Constitutional: He appears well-developed and well-nourished. No distress.  Abdominal: Soft.       Mild suprapubic tenderness  Musculoskeletal:       No CVA tenderness or low back tenderness          Assessment & Plan:

## 2010-11-18 NOTE — Assessment & Plan Note (Signed)
Partial treatment with the cipro Will change to levaquin and treat for 6 weeks Will send urine culture (some leuks and blood on urinalysis) to see if that helps guide ongoing Rx

## 2010-11-21 LAB — URINE CULTURE: Colony Count: 75000

## 2010-11-24 ENCOUNTER — Ambulatory Visit (INDEPENDENT_AMBULATORY_CARE_PROVIDER_SITE_OTHER): Payer: Medicare Other | Admitting: Emergency Medicine

## 2010-11-24 ENCOUNTER — Encounter: Payer: Medicare Other | Admitting: Emergency Medicine

## 2010-11-24 DIAGNOSIS — I4891 Unspecified atrial fibrillation: Secondary | ICD-10-CM

## 2010-11-24 DIAGNOSIS — Z7901 Long term (current) use of anticoagulants: Secondary | ICD-10-CM

## 2010-11-24 DIAGNOSIS — I4892 Unspecified atrial flutter: Secondary | ICD-10-CM

## 2010-11-24 LAB — POCT INR: INR: 2.2

## 2010-12-15 ENCOUNTER — Ambulatory Visit (INDEPENDENT_AMBULATORY_CARE_PROVIDER_SITE_OTHER): Payer: Medicare Other | Admitting: Emergency Medicine

## 2010-12-15 DIAGNOSIS — I4892 Unspecified atrial flutter: Secondary | ICD-10-CM

## 2010-12-15 DIAGNOSIS — I4891 Unspecified atrial fibrillation: Secondary | ICD-10-CM

## 2010-12-15 DIAGNOSIS — Z7901 Long term (current) use of anticoagulants: Secondary | ICD-10-CM

## 2010-12-15 LAB — POCT INR: INR: 2.5

## 2010-12-20 ENCOUNTER — Other Ambulatory Visit: Payer: Self-pay | Admitting: Internal Medicine

## 2011-01-03 ENCOUNTER — Ambulatory Visit (INDEPENDENT_AMBULATORY_CARE_PROVIDER_SITE_OTHER): Payer: Medicare Other | Admitting: Cardiovascular Disease

## 2011-01-03 ENCOUNTER — Encounter: Payer: Self-pay | Admitting: Cardiovascular Disease

## 2011-01-03 DIAGNOSIS — I4892 Unspecified atrial flutter: Secondary | ICD-10-CM

## 2011-01-03 DIAGNOSIS — I10 Essential (primary) hypertension: Secondary | ICD-10-CM

## 2011-01-03 DIAGNOSIS — E1169 Type 2 diabetes mellitus with other specified complication: Secondary | ICD-10-CM

## 2011-01-03 DIAGNOSIS — E669 Obesity, unspecified: Secondary | ICD-10-CM

## 2011-01-03 DIAGNOSIS — E119 Type 2 diabetes mellitus without complications: Secondary | ICD-10-CM

## 2011-01-03 DIAGNOSIS — R079 Chest pain, unspecified: Secondary | ICD-10-CM | POA: Insufficient documentation

## 2011-01-03 DIAGNOSIS — E785 Hyperlipidemia, unspecified: Secondary | ICD-10-CM

## 2011-01-03 MED ORDER — NITROGLYCERIN 0.4 MG SL SUBL: 0.4000 mg | SUBLINGUAL_TABLET | SUBLINGUAL | Status: DC | PRN

## 2011-01-03 NOTE — Assessment & Plan Note (Signed)
History of previous chest pain in August 2012 suspected to be secondary to GI etiology. We have given him nitroglycerin sublingual to take p.r.n. Only if chest pain symptoms recur.

## 2011-01-03 NOTE — Assessment & Plan Note (Signed)
We have encouraged continued exercise, careful diet management in an effort to lose weight. 

## 2011-01-03 NOTE — Patient Instructions (Signed)
You are doing well. Take take nitroglycerin as needed for chest pain spasm  Please call us if you have new issues that need to be addressed before your next appt.  Your physician wants you to follow-up in: 12 months.  You will receive a reminder letter in the mail two months in advance. If you don't receive a letter, please call our office to schedule the follow-up appointment.

## 2011-01-03 NOTE — Assessment & Plan Note (Signed)
Blood pressure is well controlled on today's visit. No changes made to the medications. 

## 2011-01-03 NOTE — Assessment & Plan Note (Signed)
Rate is well controlled on his current medication regimen. No changes made. He is on warfarin.

## 2011-01-03 NOTE — Progress Notes (Signed)
Patient ID: Michael Colon, male    DOB: 11/29/1930, 77 y.o.   MRN: [id number]  HPI Comments: Michael Colon is a very pleasant 77 year old gentleman with chronic atrial fibrillation, on warfarin, hypertension, borderline diabetes, Obstructive sleep apnea who presented to [facility] on August 04, 2010 with chest pain. Symptoms started after eating and it was felt he had GI spasm or hiatal hernia. Mild improvement in symptoms with nitroglycerin and GI cocktail in the emergency room. He is very active at baseline. He presents to establish care in the office.  Workup in the hospital was essentially negative with normal cardiac enzymes, negative stress test showing no ischemia. Total cholesterol in the hospital was 132, LDL 64, HDL 58  He's not had any further symptoms and has been very active, walks on a frequent basis. No further episodes of chest pain even with eating.  EKG today shows atrial fibrillation, right bundle branch block, no significant ST or T wave changes   Outpatient Encounter Prescriptions as of 01/03/2011  Medication Sig Dispense Refill  . aspirin 81 MG tablet Take 81 mg by mouth daily.        Marland Kitchen BAYER MICROLET LANCETS lancets Use as instructed  100 each  12  . Calcium Carbonate-Vitamin D (CALCIUM-CARB 600 + D) 600-125 MG-UNIT TABS Take by mouth.        . docusate sodium (COLACE) 100 MG capsule Take 100 mg by mouth 1 dose over 24 hours.       . famotidine (PEPCID) 20 MG tablet Take 20 mg by mouth 1 dose over 24 hours.       . finasteride (PROSCAR) 5 MG tablet Take 5 mg by mouth daily.        . fish oil-omega-3 fatty acids 1000 MG capsule Take 2 g by mouth daily.        Marland Kitchen glucose blood (BAYER CONTOUR TEST) test strip Use as instructed  100 each  12  . loratadine (CLARITIN) 10 MG tablet Take 10 mg by mouth daily.        . Multiple Vitamins-Minerals (CENTRUM SILVER PO) Take by mouth daily after breakfast.        . triamterene-hydrochlorothiazide (MAXZIDE) 75-50 MG per tablet TAKE 1  TABLET EVERY DAY  90 tablet  0  . warfarin (COUMADIN) 7.5 MG tablet Take 7.5 mg by mouth as directed.          Review of Systems  Constitutional: Negative.   HENT: Negative.   Eyes: Negative.   Respiratory: Negative.   Cardiovascular: Negative.   Gastrointestinal: Negative.   Musculoskeletal: Negative.   Skin: Negative.   Neurological: Negative.   Hematological: Negative.   Psychiatric/Behavioral: Negative.   All other systems reviewed and are negative.    BP 128/72  Pulse 58  Ht 5\' 11"  (1.803 m)  Wt 221 lb (100.245 kg)  BMI 30.82 kg/m2  Physical Exam  Nursing note and vitals reviewed. Constitutional: He is oriented to person, place, and time. He appears well-developed and well-nourished.  HENT:  Head: Normocephalic.  Nose: Nose normal.  Mouth/Throat: Oropharynx is clear and moist.  Eyes: Conjunctivae are normal. Pupils are equal, round, and reactive to light.  Neck: Normal range of motion. Neck supple. No JVD present.  Cardiovascular: Normal rate, regular rhythm, S1 normal, S2 normal, normal heart sounds and intact distal pulses.  Exam reveals no gallop and no friction rub.   No murmur heard. Pulmonary/Chest: Effort normal and breath sounds normal. No respiratory distress. He has no  wheezes. He has no rales. He exhibits no tenderness.  Abdominal: Soft. Bowel sounds are normal. He exhibits no distension. There is no tenderness.  Musculoskeletal: Normal range of motion. He exhibits no edema and no tenderness.  Lymphadenopathy:    He has no cervical adenopathy.  Neurological: He is alert and oriented to person, place, and time. Coordination normal.  Skin: Skin is warm and dry. No rash noted. No erythema.  Psychiatric: He has a normal mood and affect. His behavior is normal. Judgment and thought content normal.           Assessment and Plan

## 2011-01-12 ENCOUNTER — Ambulatory Visit (INDEPENDENT_AMBULATORY_CARE_PROVIDER_SITE_OTHER): Payer: Medicare Other | Admitting: Emergency Medicine

## 2011-01-12 DIAGNOSIS — I4892 Unspecified atrial flutter: Secondary | ICD-10-CM | POA: Diagnosis not present

## 2011-01-12 DIAGNOSIS — I4891 Unspecified atrial fibrillation: Secondary | ICD-10-CM | POA: Diagnosis not present

## 2011-01-12 DIAGNOSIS — Z7901 Long term (current) use of anticoagulants: Secondary | ICD-10-CM | POA: Diagnosis not present

## 2011-01-12 LAB — POCT INR: INR: 3.3

## 2011-01-24 ENCOUNTER — Ambulatory Visit (INDEPENDENT_AMBULATORY_CARE_PROVIDER_SITE_OTHER): Payer: Medicare Other | Admitting: Internal Medicine

## 2011-01-24 ENCOUNTER — Encounter: Payer: Self-pay | Admitting: Internal Medicine

## 2011-01-24 ENCOUNTER — Telehealth: Payer: Self-pay | Admitting: Internal Medicine

## 2011-01-24 DIAGNOSIS — S63509A Unspecified sprain of unspecified wrist, initial encounter: Secondary | ICD-10-CM | POA: Diagnosis not present

## 2011-01-24 NOTE — Progress Notes (Signed)
Subjective:    Patient ID: Michael Colon, male    DOB: 1930-05-21, 78 y.o.   MRN: [id number]  HPI Larey Seat outside mistaking sidewalk for grass---3 days ago Larey Seat backwards and helped catch fall on right hand  Abraded thumb Using ice and ibuprofen Using "transderm" topical pain med as well (had from podiatrist)  Using wrist splint  Still with pain across extensor wrist---and on ulnar side Not severe Still has some weakness Some swelling  Current Outpatient Prescriptions on File Prior to Visit  Medication Sig Dispense Refill  . aspirin 81 MG tablet Take 81 mg by mouth daily.        Marland Kitchen BAYER MICROLET LANCETS lancets Use as instructed  100 each  12  . Calcium Carbonate-Vitamin D (CALCIUM-CARB 600 + D) 600-125 MG-UNIT TABS Take by mouth.        . docusate sodium (COLACE) 100 MG capsule Take 100 mg by mouth 1 dose over 24 hours.       . famotidine (PEPCID) 20 MG tablet Take 20 mg by mouth 1 dose over 24 hours.       . finasteride (PROSCAR) 5 MG tablet Take 5 mg by mouth daily.        . fish oil-omega-3 fatty acids 1000 MG capsule Take 2 g by mouth daily.        Marland Kitchen glucose blood (BAYER CONTOUR TEST) test strip Use as instructed  100 each  12  . loratadine (CLARITIN) 10 MG tablet Take 10 mg by mouth daily.        . Multiple Vitamins-Minerals (CENTRUM SILVER PO) Take by mouth daily after breakfast.        . nitroGLYCERIN (NITROSTAT) 0.4 MG SL tablet Place 1 tablet (0.4 mg total) under the tongue every 5 (five) minutes as needed for chest pain.  25 tablet  3  . triamterene-hydrochlorothiazide (MAXZIDE) 75-50 MG per tablet TAKE 1 TABLET EVERY DAY  90 tablet  0  . warfarin (COUMADIN) 7.5 MG tablet Take 7.5 mg by mouth as directed.          Allergies  Allergen Reactions  . Latex Rash  . Lovenox Hives  . Percocet (Oxycodone-Acetaminophen) Rash    Past Medical History  Diagnosis Date  . Chronic prostatitis   . Obstructive sleep apnea     CPAP-9  . Chronic ear infection   . Asthma    . GERD (gastroesophageal reflux disease)     laryngeal involvement  . Hypertension   . Osteoarthrosis, localized, primary, knee     post-traumatic  . Diabetes mellitus   . Tachy-brady syndrome   . A-fib 2007  . Benign prostatic hypertrophy     Past Surgical History  Procedure Date  . Mastoidectomy 8/08    Dr [name]  . Rhinoplasty 5/10    and septoplasty  . Cataract extraction, bilateral 2009  . Belpharoptosis repair     Dr [name]---didn't resolve weepy eye and eyelid drooping  . Knee surgery 1998    plate after fracture, then removed for infection  . Cardioversion 04/13/2010  . Chest pain 8/12    Stress test benign  . Appendectomy   . Left elbow surgery   . Venous ablation     Family History  Problem Relation Age of Onset  . Other Mother     natural causes  . Colon cancer Father   . Diabetes Father     History   Social History  . Marital Status: Married    Spouse  Name: N/A    Number of Children: 2  . 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 Smoking status: Former Smoker -- 1.0 packs/day for 0 years    Types: Cigarettes    Quit date: 01/03/1969  . Smokeless tobacco: Never Used  . Alcohol Use: 0.6 oz/week    1 Glasses of wine per week     rare wine  . Drug Use: No  . Sexually Active: Not on file   Other Topics Concern  . Not on file   Social History Narrative   Has living willHealth care POA-- wife and then son Bryson Dames DNR order from the past---form redone 6/22/12Not sure about tube feedings   Review of Systems Able to still use hand but mostly with brace on No fever---was is 0.8 degrees higher than usual    Objective:   Physical Exam  Constitutional: He appears well-developed and well-nourished. No distress.  Musculoskeletal:       Right elbow normal No forearm tenderness Very slight tenderness over dorsal wrist though no distinct bony tenderness in wrist or  metatarsals Full passive ROM of right wrist  Neurological:       Only slightly reduced grip strength on right  Skin:       Abraded area without inflammation on right thumb          Assessment & Plan:

## 2011-01-24 NOTE — Telephone Encounter (Signed)
Triage Record Num: [id number] Operator: Geanie Berlin Patient Name: Michael Colon Call Date & Time: 01/24/2011 8:45:40AM Patient Phone: [phone] PCP: Michael Colon Patient Gender: Male PCP Fax : [phone] Patient DOB: 1930-02-21 Practice Name: [facility] Reason for Call: Caller: Michael Colon/Patient; PCP: Michael Colon I.; CB#: [phone]; Call regarding Larey Seat On Ice Injuring Right Wrist on 01/21/11. Afebrile. Used ice and wrist spint over weekend. Advised to see MD now for extremity swelling or limited range of motion and taking blood thinner per Wrist Injury Guideline. Pe Epic, already has acute appt for 1215 01/24/11 with Dr Alphonsus Sias. Prefers to keep appt with PCP than to see another MD earlier. Protocol(s) Used: Wrist Injury Recommended Outcome per Protocol: See ED Immediately Reason for Outcome: Extremity swelling or limitation of range of motion AND known bleeding disorder OR taking blood thinner, chemotherapy or transplant medications Care Advice: ~ Another adult should drive. ~ Apply cloth-covered ice pack or a cool compress to the area while in transit to reduce pain and swelling. ~ Support injured part in position of comfort to reduce pain and prevent further damage; avoid unnecessary movement. ~ Do not take NSAIDs (non-steroidal anti-inflammatory medications) such as aspirin, ibuprofen and naproxen. ~ IMMEDIATE ACTION Write down provider's name. List or place the following in a bag for transport with the patient: current prescription and/or nonprescription medications; alternative treatments, therapies and medications; and street drugs. ~ 01/24/2011 9:01:35AM Page 1 of 1 [id number]

## 2011-01-24 NOTE — Assessment & Plan Note (Signed)
Nothing to suggest fracture Will continue to use immobilizer for now If pain not better--or worsens--will check x-ray

## 2011-01-25 DIAGNOSIS — L259 Unspecified contact dermatitis, unspecified cause: Secondary | ICD-10-CM | POA: Diagnosis not present

## 2011-01-25 DIAGNOSIS — L738 Other specified follicular disorders: Secondary | ICD-10-CM | POA: Diagnosis not present

## 2011-01-25 DIAGNOSIS — L820 Inflamed seborrheic keratosis: Secondary | ICD-10-CM | POA: Diagnosis not present

## 2011-01-25 DIAGNOSIS — L821 Other seborrheic keratosis: Secondary | ICD-10-CM | POA: Diagnosis not present

## 2011-01-26 ENCOUNTER — Ambulatory Visit (INDEPENDENT_AMBULATORY_CARE_PROVIDER_SITE_OTHER): Payer: Medicare Other | Admitting: Emergency Medicine

## 2011-01-26 DIAGNOSIS — I4892 Unspecified atrial flutter: Secondary | ICD-10-CM | POA: Diagnosis not present

## 2011-01-26 DIAGNOSIS — Z7901 Long term (current) use of anticoagulants: Secondary | ICD-10-CM | POA: Diagnosis not present

## 2011-01-26 LAB — POCT INR: INR: 2.4

## 2011-01-27 ENCOUNTER — Telehealth: Payer: Self-pay | Admitting: *Deleted

## 2011-01-27 NOTE — Telephone Encounter (Signed)
Pt's wife states his wrist is not better can he come in for just a x-ray? Or does he need OV?

## 2011-01-27 NOTE — Telephone Encounter (Signed)
Okay to set up just right wrist x-ray Diagnosis wrist pain  This was the plan if he didn't improve quickly

## 2011-01-31 NOTE — Telephone Encounter (Signed)
Spoke with patient and his wife and he doesn't need the x-ray right now, his feeling a lot better.

## 2011-01-31 NOTE — Telephone Encounter (Signed)
Good to hear

## 2011-02-03 DIAGNOSIS — H1130 Conjunctival hemorrhage, unspecified eye: Secondary | ICD-10-CM | POA: Diagnosis not present

## 2011-02-16 ENCOUNTER — Ambulatory Visit (INDEPENDENT_AMBULATORY_CARE_PROVIDER_SITE_OTHER): Payer: Medicare Other | Admitting: Emergency Medicine

## 2011-02-16 DIAGNOSIS — Z7901 Long term (current) use of anticoagulants: Secondary | ICD-10-CM

## 2011-02-16 DIAGNOSIS — I4892 Unspecified atrial flutter: Secondary | ICD-10-CM

## 2011-02-16 DIAGNOSIS — I4891 Unspecified atrial fibrillation: Secondary | ICD-10-CM

## 2011-02-16 LAB — POCT INR: INR: 3.1

## 2011-02-22 DIAGNOSIS — M216X9 Other acquired deformities of unspecified foot: Secondary | ICD-10-CM | POA: Diagnosis not present

## 2011-02-22 DIAGNOSIS — M76829 Posterior tibial tendinitis, unspecified leg: Secondary | ICD-10-CM | POA: Diagnosis not present

## 2011-03-09 ENCOUNTER — Ambulatory Visit (INDEPENDENT_AMBULATORY_CARE_PROVIDER_SITE_OTHER): Payer: Medicare Other

## 2011-03-09 DIAGNOSIS — I4891 Unspecified atrial fibrillation: Secondary | ICD-10-CM | POA: Diagnosis not present

## 2011-03-09 DIAGNOSIS — I4892 Unspecified atrial flutter: Secondary | ICD-10-CM

## 2011-03-09 DIAGNOSIS — Z7901 Long term (current) use of anticoagulants: Secondary | ICD-10-CM | POA: Diagnosis not present

## 2011-03-09 LAB — POCT INR: INR: 3

## 2011-03-16 ENCOUNTER — Other Ambulatory Visit: Payer: Self-pay | Admitting: Internal Medicine

## 2011-04-06 ENCOUNTER — Ambulatory Visit (INDEPENDENT_AMBULATORY_CARE_PROVIDER_SITE_OTHER): Payer: Medicare Other

## 2011-04-06 DIAGNOSIS — I4892 Unspecified atrial flutter: Secondary | ICD-10-CM | POA: Diagnosis not present

## 2011-04-06 DIAGNOSIS — Z7901 Long term (current) use of anticoagulants: Secondary | ICD-10-CM

## 2011-04-06 LAB — POCT INR: INR: 2.2

## 2011-04-12 ENCOUNTER — Encounter: Payer: Self-pay | Admitting: Internal Medicine

## 2011-04-12 ENCOUNTER — Ambulatory Visit (INDEPENDENT_AMBULATORY_CARE_PROVIDER_SITE_OTHER): Payer: Medicare Other | Admitting: Internal Medicine

## 2011-04-12 ENCOUNTER — Other Ambulatory Visit: Payer: Self-pay | Admitting: *Deleted

## 2011-04-12 VITALS — BP 122/80 | HR 80 | Temp 97.8°F | Ht 71.0 in | Wt 234.0 lb

## 2011-04-12 DIAGNOSIS — E669 Obesity, unspecified: Secondary | ICD-10-CM | POA: Diagnosis not present

## 2011-04-12 DIAGNOSIS — J45909 Unspecified asthma, uncomplicated: Secondary | ICD-10-CM | POA: Diagnosis not present

## 2011-04-12 DIAGNOSIS — E119 Type 2 diabetes mellitus without complications: Secondary | ICD-10-CM

## 2011-04-12 DIAGNOSIS — E1169 Type 2 diabetes mellitus with other specified complication: Secondary | ICD-10-CM

## 2011-04-12 DIAGNOSIS — I10 Essential (primary) hypertension: Secondary | ICD-10-CM

## 2011-04-12 DIAGNOSIS — N411 Chronic prostatitis: Secondary | ICD-10-CM

## 2011-04-12 DIAGNOSIS — I4892 Unspecified atrial flutter: Secondary | ICD-10-CM

## 2011-04-12 LAB — LIPID PANEL
Cholesterol: 193 mg/dL (ref 0–200)
HDL: 60.3 mg/dL (ref >39.00)
LDL Cholesterol: 116 mg/dL — ABNORMAL HIGH (ref 0–99)
Total CHOL/HDL Ratio: 3
Triglycerides: 83 mg/dL (ref 0.0–149.0)
VLDL: 16.6 mg/dL (ref 0.0–40.0)

## 2011-04-12 LAB — CBC WITH DIFFERENTIAL/PLATELET
Basophils Absolute: 0.1 10*3/uL (ref 0.0–0.1)
Basophils Relative: 1 % (ref 0.0–3.0)
Eosinophils Absolute: 0.5 10*3/uL (ref 0.0–0.7)
Eosinophils Relative: 8.2 % — ABNORMAL HIGH (ref 0.0–5.0)
HCT: 47.9 % (ref 39.0–52.0)
Hemoglobin: 15.8 g/dL (ref 13.0–17.0)
Lymphocytes Relative: 23.1 % (ref 12.0–46.0)
Lymphs Abs: 1.4 10*3/uL (ref 0.7–4.0)
MCHC: 32.9 g/dL (ref 30.0–36.0)
MCV: 94.1 fl (ref 78.0–100.0)
Monocytes Absolute: 0.8 10*3/uL (ref 0.1–1.0)
Monocytes Relative: 13.2 % — ABNORMAL HIGH (ref 3.0–12.0)
Neutro Abs: 3.2 10*3/uL (ref 1.4–7.7)
Neutrophils Relative %: 54.5 % (ref 43.0–77.0)
Platelets: 132 10*3/uL — ABNORMAL LOW (ref 150.0–400.0)
RBC: 5.09 Mil/uL (ref 4.22–5.81)
RDW: 12.7 % (ref 11.5–14.6)
WBC: 5.9 10*3/uL (ref 4.5–10.5)

## 2011-04-12 LAB — BASIC METABOLIC PANEL
BUN: 19 mg/dL (ref 6–23)
CO2: 29 mEq/L (ref 19–32)
Calcium: 10 mg/dL (ref 8.4–10.5)
Chloride: 100 mEq/L (ref 96–112)
Creatinine, Ser: 0.8 mg/dL (ref 0.4–1.5)
GFR: 105.32 mL/min (ref >60.00)
Glucose, Bld: 147 mg/dL — ABNORMAL HIGH (ref 70–99)
Potassium: 3.5 mEq/L (ref 3.5–5.1)
Sodium: 139 mEq/L (ref 135–145)

## 2011-04-12 LAB — HEPATIC FUNCTION PANEL
ALT: 20 U/L (ref 0–53)
AST: 21 U/L (ref 0–37)
Albumin: 3.9 g/dL (ref 3.5–5.2)
Alkaline Phosphatase: 59 U/L (ref 39–117)
Bilirubin, Direct: 0.1 mg/dL (ref 0.0–0.3)
Total Bilirubin: 0.7 mg/dL (ref 0.3–1.2)
Total Protein: 6.8 g/dL (ref 6.0–8.3)

## 2011-04-12 LAB — MICROALBUMIN / CREATININE URINE RATIO
Creatinine,U: 141 mg/dL
Microalb Creat Ratio: 1.6 mg/g (ref 0.0–30.0)
Microalb, Ur: 2.2 mg/dL — ABNORMAL HIGH (ref 0.0–1.9)

## 2011-04-12 LAB — TSH: TSH: 0.9 u[IU]/mL (ref 0.35–5.50)

## 2011-04-12 MED ORDER — ALBUTEROL SULFATE HFA 108 (90 BASE) MCG/ACT IN AERS: 2.0000 | INHALATION_SPRAY | Freq: Four times a day (QID) | RESPIRATORY_TRACT | Status: DC | PRN

## 2011-04-12 NOTE — Assessment & Plan Note (Signed)
Lab Results  Component Value Date   HGBA1C 6.4 10/12/2010   Control may be not quite as good as he has decreased exercise Will recheck

## 2011-04-12 NOTE — Assessment & Plan Note (Signed)
Voiding well on the finasteride No changes

## 2011-04-12 NOTE — Progress Notes (Signed)
Subjective:    Patient ID: Michael Colon, male    DOB: Jan 08, 1930, 78 y.o.   MRN: [id number]  HPI doing well Wrist did get better  Prostate has gotten better Voiding okay on finasteride  Checks sugars 2-3 times per week Usually under 100 until less exercise Now under 130 usually No hypoglycemic reactions Goes to [facility] for regular exams  Injured right ankle In brace Has limited walking and this affects his sugars Seeing podiatrist---tendon sprain  Last month had bad cold with rhinorrhea, then to throat Tried mucinex Uses albuterol inhaler rarely when cold goes to chest  No heart problems No palpitations No chest pain No SOB Current Outpatient Prescriptions on File Prior to Visit  Medication Sig Dispense Refill  . aspirin 81 MG tablet Take 81 mg by mouth daily.        Marland Kitchen BAYER MICROLET LANCETS lancets Use as instructed  100 each  12  . Calcium Carbonate-Vitamin D (CALCIUM-CARB 600 + D) 600-125 MG-UNIT TABS Take by mouth.        . docusate sodium (COLACE) 100 MG capsule Take 100 mg by mouth 1 dose over 24 hours.       . famotidine (PEPCID) 20 MG tablet Take 20 mg by mouth 1 dose over 24 hours.       . finasteride (PROSCAR) 5 MG tablet Take 5 mg by mouth daily.        . fish oil-omega-3 fatty acids 1000 MG capsule Take 2 g by mouth daily.        Marland Kitchen glucose blood (BAYER CONTOUR TEST) test strip Use as instructed  100 each  12  . loratadine (CLARITIN) 10 MG tablet Take 10 mg by mouth daily.        . Multiple Vitamins-Minerals (CENTRUM SILVER PO) Take by mouth daily after breakfast.        . nitroGLYCERIN (NITROSTAT) 0.4 MG SL tablet Place 1 tablet (0.4 mg total) under the tongue every 5 (five) minutes as needed for chest pain.  25 tablet  3  . triamterene-hydrochlorothiazide (MAXZIDE) 75-50 MG per tablet TAKE 1 TABLET EVERY DAY  90 tablet  1  . warfarin (COUMADIN) 7.5 MG tablet Take 7.5 mg by mouth as directed.          Allergies  Allergen Reactions  . Latex  Rash  . Lovenox Hives  . Percocet (Oxycodone-Acetaminophen) Rash    Past Medical History  Diagnosis Date  . Chronic prostatitis   . Obstructive sleep apnea     CPAP-9  . Chronic ear infection   . Asthma   . GERD (gastroesophageal reflux disease)     laryngeal involvement  . Hypertension   . Osteoarthrosis, localized, primary, knee     post-traumatic  . Diabetes mellitus   . Tachy-brady syndrome   . A-fib 2007  . Benign prostatic hypertrophy     Past Surgical History  Procedure Date  . Mastoidectomy 8/08    Dr [name]  . Rhinoplasty 5/10    and septoplasty  . Cataract extraction, bilateral 2009  . Belpharoptosis repair     Dr [name]---didn't resolve weepy eye and eyelid drooping  . Knee surgery 1998    plate after fracture, then removed for infection  . Cardioversion 04/13/2010  . Chest pain 8/12    Stress test benign  . Appendectomy   . Left elbow surgery   . Venous ablation     Family History  Problem Relation Age of Onset  . Other  Mother     natural causes  . Colon cancer Father   . Diabetes Father     History   Social History  . Marital Status: Married    Spouse Name: N/A    Number of Children: 2  . 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 Smoking status: Former Smoker -- 1.0 packs/day for 0 years    Types: Cigarettes    Quit date: 01/03/1969  . Smokeless tobacco: Never Used  . Alcohol Use: 0.6 oz/week    1 Glasses of wine per week     rare wine  . Drug Use: No  . Sexually Active: Not on file   Other Topics Concern  . Not on file   Social History Narrative   Has living willHealth care POA-- wife and then son Bryson Dames DNR order from the past---form redone 6/22/12Not sure about tube feedings   Review of Systems Appetite is fine Weight is up a few pounds---due to less walking Sleeping okay     Objective:   Physical Exam  Constitutional: He appears  well-developed and well-nourished. No distress.  Neck: Normal range of motion. Neck supple. No thyromegaly present.  Cardiovascular: Normal rate, regular rhythm and normal heart sounds.  Exam reveals no gallop.   No murmur heard.         Pulmonary/Chest: Effort normal and breath sounds normal. No respiratory distress. He has no wheezes. He has no rales.  Musculoskeletal: He exhibits no edema and no tenderness.  Lymphadenopathy:    He has no cervical adenopathy.  Skin:       No foot ulcers  Psychiatric: He has a normal mood and affect. His behavior is normal.          Assessment & Plan:

## 2011-04-12 NOTE — Assessment & Plan Note (Signed)
BP Readings from Last 3 Encounters:  04/12/11 122/80  01/24/11 118/68  01/03/11 128/72   Good control  no changes needed

## 2011-04-12 NOTE — Assessment & Plan Note (Signed)
Has mild intermittent symptoms Uses albuterol infrequently

## 2011-04-12 NOTE — Assessment & Plan Note (Signed)
Still regular Remains on coumadin

## 2011-04-14 LAB — HEMOGLOBIN A1C: Hgb A1c MFr Bld: 6.8 % — ABNORMAL HIGH (ref 4.6–6.5)

## 2011-04-18 ENCOUNTER — Other Ambulatory Visit: Payer: Self-pay | Admitting: Internal Medicine

## 2011-04-18 ENCOUNTER — Ambulatory Visit (INDEPENDENT_AMBULATORY_CARE_PROVIDER_SITE_OTHER): Payer: Medicare Other | Admitting: Internal Medicine

## 2011-04-18 ENCOUNTER — Encounter: Payer: Self-pay | Admitting: Internal Medicine

## 2011-04-18 ENCOUNTER — Encounter: Payer: Self-pay | Admitting: *Deleted

## 2011-04-18 VITALS — BP 112/70 | HR 66 | Temp 97.8°F | Wt 234.0 lb

## 2011-04-18 DIAGNOSIS — S60029A Contusion of unspecified index finger without damage to nail, initial encounter: Secondary | ICD-10-CM

## 2011-04-18 DIAGNOSIS — S6000XA Contusion of unspecified finger without damage to nail, initial encounter: Secondary | ICD-10-CM

## 2011-04-18 NOTE — Progress Notes (Signed)
Subjective:    Patient ID: Michael Colon, male    DOB: 1930/04/25, 78 y.o.   MRN: [id number]  HPI 3 days Using a sledge hammer to drive a post into ground Board slipped and he hit left 2nd finger Part of that finger was amputated by table saw years ago  Soaked in salty ice water---decreased swelling Used antibiotic sauve and covered it Cleaning with soap and water  Tender but not so painful at rest Redness is now better---some abraded skin on extensor side  Current Outpatient Prescriptions on File Prior to Visit  Medication Sig Dispense Refill  . albuterol (VENTOLIN HFA) 108 (90 BASE) MCG/ACT inhaler Inhale 2 puffs into the lungs every 6 (six) hours as needed.  8.5 g  11  . aspirin 81 MG tablet Take 81 mg by mouth daily.        Marland Kitchen BAYER MICROLET LANCETS lancets Use as instructed  100 each  12  . Calcium Carbonate-Vitamin D (CALCIUM-CARB 600 + D) 600-125 MG-UNIT TABS Take by mouth.        . docusate sodium (COLACE) 100 MG capsule Take 100 mg by mouth 1 dose over 24 hours.       . famotidine (PEPCID) 20 MG tablet Take 20 mg by mouth 1 dose over 24 hours.       . fish oil-omega-3 fatty acids 1000 MG capsule Take 2 g by mouth daily.        Marland Kitchen glucose blood (BAYER CONTOUR TEST) test strip Use as instructed  100 each  12  . loratadine (CLARITIN) 10 MG tablet Take 10 mg by mouth daily.        . Multiple Vitamins-Minerals (CENTRUM SILVER PO) Take by mouth daily after breakfast.        . nitroGLYCERIN (NITROSTAT) 0.4 MG SL tablet Place 1 tablet (0.4 mg total) under the tongue every 5 (five) minutes as needed for chest pain.  25 tablet  3  . triamterene-hydrochlorothiazide (MAXZIDE) 75-50 MG per tablet TAKE 1 TABLET EVERY DAY  90 tablet  1  . warfarin (COUMADIN) 7.5 MG tablet Take 7.5 mg by mouth as directed.          Allergies  Allergen Reactions  . Latex Rash  . Lovenox Hives  . Percocet (Oxycodone-Acetaminophen) Rash    Past Medical History  Diagnosis Date  . Chronic  prostatitis   . Obstructive sleep apnea     CPAP-9  . Chronic ear infection   . Asthma   . GERD (gastroesophageal reflux disease)     laryngeal involvement  . Hypertension   . Osteoarthrosis, localized, primary, knee     post-traumatic  . Diabetes mellitus   . Tachy-brady syndrome   . A-fib 2007  . Benign prostatic hypertrophy     Past Surgical History  Procedure Date  . Mastoidectomy 8/08    Dr [name]  . Rhinoplasty 5/10    and septoplasty  . Cataract extraction, bilateral 2009  . Belpharoptosis repair     Dr [name]---didn't resolve weepy eye and eyelid drooping  . Knee surgery 1998    plate after fracture, then removed for infection  . Cardioversion 04/13/2010  . Chest pain 8/12    Stress test benign  . Appendectomy   . Left elbow surgery   . Venous ablation     Family History  Problem Relation Age of Onset  . Other Mother     natural causes  . Colon cancer Father   . Diabetes Father  History   Social History  . Marital Status: Married    Spouse Name: N/A    Number of Children: 2  . 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 Smoking status: Former Smoker -- 1.0 packs/day for 0 years    Types: Cigarettes    Quit date: 01/03/1969  . Smokeless tobacco: Never Used  . Alcohol Use: 0.6 oz/week    1 Glasses of wine per week     rare wine  . Drug Use: No  . Sexually Active: Not on file   Other Topics Concern  . Not on file   Social History Narrative   Has living willHealth care POA-- wife and then son Bryson Dames DNR order from the past---form redone 6/22/12Not sure about tube feedings   Review of Systems No fever No nausea or vomiting    Objective:   Physical Exam  Constitutional: He appears well-developed and well-nourished. No distress.  Musculoskeletal:       Left 2nd finger amputated at just past the DIP Has abraded skin proximal to what is left of nail Mild redness  and swelling No striking tenderness Not warm          Assessment & Plan:

## 2011-04-18 NOTE — Assessment & Plan Note (Signed)
No evidence of bony damage or infection Will give flex gauze to hold gauze on---he uses this for padding Observe only

## 2011-04-28 DIAGNOSIS — H663X9 Other chronic suppurative otitis media, unspecified ear: Secondary | ICD-10-CM | POA: Diagnosis not present

## 2011-04-28 DIAGNOSIS — H6120 Impacted cerumen, unspecified ear: Secondary | ICD-10-CM | POA: Diagnosis not present

## 2011-04-28 DIAGNOSIS — H903 Sensorineural hearing loss, bilateral: Secondary | ICD-10-CM | POA: Diagnosis not present

## 2011-05-04 ENCOUNTER — Ambulatory Visit (INDEPENDENT_AMBULATORY_CARE_PROVIDER_SITE_OTHER): Payer: Medicare Other

## 2011-05-04 DIAGNOSIS — I4892 Unspecified atrial flutter: Secondary | ICD-10-CM

## 2011-05-04 DIAGNOSIS — Z7901 Long term (current) use of anticoagulants: Secondary | ICD-10-CM

## 2011-05-04 LAB — POCT INR: INR: 3.1

## 2011-05-10 DIAGNOSIS — H6120 Impacted cerumen, unspecified ear: Secondary | ICD-10-CM | POA: Diagnosis not present

## 2011-05-10 DIAGNOSIS — H903 Sensorineural hearing loss, bilateral: Secondary | ICD-10-CM | POA: Diagnosis not present

## 2011-05-10 DIAGNOSIS — H663X9 Other chronic suppurative otitis media, unspecified ear: Secondary | ICD-10-CM | POA: Diagnosis not present

## 2011-05-23 ENCOUNTER — Telehealth: Payer: Self-pay | Admitting: Internal Medicine

## 2011-05-23 NOTE — Telephone Encounter (Signed)
Wife just wanted correct dose of coumadin put on pt's medication list.

## 2011-05-23 NOTE — Telephone Encounter (Signed)
Wife Called to confirm correct dose of Warfarin.  Please call back to verify dosage.

## 2011-05-24 DIAGNOSIS — M76829 Posterior tibial tendinitis, unspecified leg: Secondary | ICD-10-CM | POA: Diagnosis not present

## 2011-05-24 DIAGNOSIS — M216X9 Other acquired deformities of unspecified foot: Secondary | ICD-10-CM | POA: Diagnosis not present

## 2011-06-08 ENCOUNTER — Ambulatory Visit (INDEPENDENT_AMBULATORY_CARE_PROVIDER_SITE_OTHER): Payer: Medicare Other

## 2011-06-08 DIAGNOSIS — I4891 Unspecified atrial fibrillation: Secondary | ICD-10-CM

## 2011-06-08 DIAGNOSIS — I4892 Unspecified atrial flutter: Secondary | ICD-10-CM | POA: Diagnosis not present

## 2011-06-08 DIAGNOSIS — Z7901 Long term (current) use of anticoagulants: Secondary | ICD-10-CM | POA: Diagnosis not present

## 2011-06-08 LAB — POCT INR: INR: 3

## 2011-07-04 DIAGNOSIS — R040 Epistaxis: Secondary | ICD-10-CM | POA: Diagnosis not present

## 2011-07-06 ENCOUNTER — Ambulatory Visit (INDEPENDENT_AMBULATORY_CARE_PROVIDER_SITE_OTHER): Payer: Medicare Other | Admitting: *Deleted

## 2011-07-06 ENCOUNTER — Ambulatory Visit (INDEPENDENT_AMBULATORY_CARE_PROVIDER_SITE_OTHER): Payer: Medicare Other

## 2011-07-06 VITALS — BP 144/84 | HR 41 | Wt 234.0 lb

## 2011-07-06 DIAGNOSIS — I4892 Unspecified atrial flutter: Secondary | ICD-10-CM | POA: Diagnosis not present

## 2011-07-06 DIAGNOSIS — R5383 Other fatigue: Secondary | ICD-10-CM | POA: Diagnosis not present

## 2011-07-06 DIAGNOSIS — Z7901 Long term (current) use of anticoagulants: Secondary | ICD-10-CM

## 2011-07-06 DIAGNOSIS — R5381 Other malaise: Secondary | ICD-10-CM

## 2011-07-06 DIAGNOSIS — R0989 Other specified symptoms and signs involving the circulatory and respiratory systems: Secondary | ICD-10-CM

## 2011-07-06 LAB — POCT INR: INR: 2.9

## 2011-07-06 NOTE — Patient Instructions (Addendum)
Monitor BP/HR at home and call us with results 7/8

## 2011-07-06 NOTE — Progress Notes (Addendum)
Pt here for coumadin check.  He c/o feeling "fatigued".  BP was obtained as well as EKG.  HR=41 BPM in NSR.  Pt says his HR rarely gets above 50 BPM.  He says this has been an ongoing issue for him.  He is not concerned about HR.  He is more concerned about his BP.  He is not on any rate altering meds.  I told him I would discuss with Dr. Mariah Milling and call him back.  He has appt with Dr. Mariah Milling in August but it may be that we need to see him sooner.  He verb. Understanding.  Reviewed EKG with Dr. Mariah Milling.  He agrees it shows Sinus bradycardia with a rate of 41 BPM.  He is on no rate altering meds that we can hold.  Since pt says this si a chronic finding, pt should monitor BPs and HR at home for a few days/1 week and call us with results.  Based on readings, if BP remains elevated, we may want to try amlodipine 5 mg qd.  We will see him in f/u after he calls with readings.  Pt informed.  Understanding verb.  He will call us on Monday 7/8 with results.

## 2011-07-12 DIAGNOSIS — L821 Other seborrheic keratosis: Secondary | ICD-10-CM | POA: Diagnosis not present

## 2011-07-12 DIAGNOSIS — L820 Inflamed seborrheic keratosis: Secondary | ICD-10-CM | POA: Diagnosis not present

## 2011-07-12 DIAGNOSIS — I8310 Varicose veins of unspecified lower extremity with inflammation: Secondary | ICD-10-CM | POA: Diagnosis not present

## 2011-07-12 DIAGNOSIS — L738 Other specified follicular disorders: Secondary | ICD-10-CM | POA: Diagnosis not present

## 2011-07-13 ENCOUNTER — Other Ambulatory Visit: Payer: Self-pay

## 2011-07-13 ENCOUNTER — Telehealth: Payer: Self-pay

## 2011-07-13 MED ORDER — AMLODIPINE BESYLATE 5 MG PO TABS: 5.0000 mg | ORAL_TABLET | Freq: Every day | ORAL | Status: DC

## 2011-07-13 NOTE — Telephone Encounter (Signed)
Pt dropped off BP readings/HR readings BP                          HR 144/79                     37 124/68             43 138/92   41 98/64   42 144/62   32 127/68   35 153/78   41 143/79   68 134/70   43 179/97   55 161/92   42 150/87   39 159/87   43 123/95 153/102 133/73 171/70

## 2011-07-13 NOTE — Telephone Encounter (Signed)
Line busy

## 2011-07-13 NOTE — Telephone Encounter (Signed)
See what I did and advise if i need to make any changes. Thanks  Per Dr. Windell Hummingbird suggestion at last visit, I advised pt to try amlodipine 5 mg daily.  I explained this medication does not affect your HR.  He should call if he notices worsening LE edema after starting this med.  Understanding verb.

## 2011-07-13 NOTE — Telephone Encounter (Signed)
Looks good HR low No dizziness or sx? If none, will monitor for now

## 2011-07-14 NOTE — Telephone Encounter (Signed)
Pt denies dizziness or near syncope.  He says he just feels more tired than usual with these low HRs. He is not due to see Dr. Mariah Milling until December.  He wonders if he can come off of the warfarin since the last couple of EKGs have shown sinus bradycardia.  I explained a patient has to usually be in sinus rhythm for 6 months before considering d/c warfarin.  Looking back at old EKGs it appears pt was sinus brady in December 2012.  Pt would like to come in to discuss with Dr. Mariah Milling.  Appt made with Dr. Mariah Milling for 07/20/11.

## 2011-07-20 ENCOUNTER — Ambulatory Visit (INDEPENDENT_AMBULATORY_CARE_PROVIDER_SITE_OTHER): Payer: Medicare Other | Admitting: Cardiovascular Disease

## 2011-07-20 ENCOUNTER — Encounter: Payer: Self-pay | Admitting: Cardiovascular Disease

## 2011-07-20 VITALS — BP 108/72 | HR 78 | Ht 71.0 in | Wt 230.2 lb

## 2011-07-20 DIAGNOSIS — I4891 Unspecified atrial fibrillation: Secondary | ICD-10-CM | POA: Diagnosis not present

## 2011-07-20 DIAGNOSIS — I495 Sick sinus syndrome: Secondary | ICD-10-CM | POA: Diagnosis not present

## 2011-07-20 DIAGNOSIS — E119 Type 2 diabetes mellitus without complications: Secondary | ICD-10-CM

## 2011-07-20 DIAGNOSIS — I10 Essential (primary) hypertension: Secondary | ICD-10-CM

## 2011-07-20 DIAGNOSIS — E1169 Type 2 diabetes mellitus with other specified complication: Secondary | ICD-10-CM

## 2011-07-20 DIAGNOSIS — I4892 Unspecified atrial flutter: Secondary | ICD-10-CM | POA: Diagnosis not present

## 2011-07-20 DIAGNOSIS — E669 Obesity, unspecified: Secondary | ICD-10-CM

## 2011-07-20 NOTE — Assessment & Plan Note (Addendum)
He appears to have significant bradycardia when in sinus rhythm with heart rates in the mid-30s to low 40s. EKG 2 weeks ago documenting normal sinus rhythm with rate in the 40s. He does report having fatigue with a low heart rate. If he continues to have paroxysmal atrial flutter with bradycardia when in sinus rhythm, he may benefit from a pacemaker.

## 2011-07-20 NOTE — Progress Notes (Signed)
Patient ID: Michael Colon, male    DOB: [birth year], 78 y.o.   MRN: [id number]  HPI Comments: Michael Colon is a very pleasant 78 year old gentleman with chronic atrial fibrillation, history of previous ablation , on warfarin, hypertension, borderline diabetes, Obstructive sleep apnea who presented to [facility] on August 04, 2010 with chest pain. Symptoms started after eating and it was felt he had GI spasm or hiatal hernia. Mild improvement in symptoms with nitroglycerin and GI cocktail in the emergency room. He is very active at baseline. On his last clinic visit he was noted to be in atrial fibrillation/flutter.  Workup in the hospital was essentially negative with normal cardiac enzymes, negative stress test showing no ischemia. Total cholesterol in the hospital was 132, LDL 64, HDL 58  He presented for an EKG at the beginning of the month that showed normal sinus rhythm. He was feeling well. Warfarin was held and he presents for followup today. On his last visit, amlodipine 5 mg was started. Blood pressure has been significantly improved and he feels well. He does have occasional swelling in his ankles.   EKG today shows atrial flutter with ventricular rate 78 beats per minute   Outpatient Encounter Prescriptions as of 07/20/2011  Medication Sig Dispense Refill  . albuterol (VENTOLIN HFA) 108 (90 BASE) MCG/ACT inhaler Inhale 2 puffs into the lungs every 6 (six) hours as needed.  8.5 g  11  . amLODipine (NORVASC) 5 MG tablet Take 1 tablet (5 mg total) by mouth daily.  30 tablet  3  . aspirin 81 MG tablet Take 81 mg by mouth daily.        . Calcium Carbonate-Vitamin D (CALCIUM-CARB 600 + D) 600-125 MG-UNIT TABS Take by mouth.        . docusate sodium (COLACE) 100 MG capsule Take 100 mg by mouth 2 (two) times daily.       . famotidine (PEPCID) 20 MG tablet Take 20 mg by mouth 1 dose over 24 hours.       . finasteride (PROSCAR) 5 MG tablet TAKE 1 TABLET BY MOUTH EVERY DAY  90 tablet  1  . fish  oil-omega-3 fatty acids 1000 MG capsule Take 1 g by mouth daily.       Marland Kitchen loratadine (CLARITIN) 10 MG tablet Take 10 mg by mouth daily.        . Multiple Vitamins-Minerals (CENTRUM SILVER PO) Take by mouth daily after breakfast.        . nitroGLYCERIN (NITROSTAT) 0.4 MG SL tablet Place 1 tablet (0.4 mg total) under the tongue every 5 (five) minutes as needed for chest pain.  25 tablet  3  . triamterene-hydrochlorothiazide (MAXZIDE) 75-50 MG per tablet TAKE 1 TABLET EVERY DAY  90 tablet  1   Review of Systems  Constitutional: Negative.   HENT: Negative.   Eyes: Negative.   Respiratory: Negative.   Cardiovascular: Positive for leg swelling.  Gastrointestinal: Negative.   Musculoskeletal: Negative.   Skin: Negative.   Neurological: Negative.   Hematological: Negative.   Psychiatric/Behavioral: Negative.   All other systems reviewed and are negative.    BP 108/72  Pulse 78  Ht 5\' 11"  (1.803 m)  Wt 230 lb 4 oz (104.441 kg)  BMI 32.11 kg/m2  Physical Exam  Nursing note and vitals reviewed. Constitutional: He is oriented to person, place, and time. He appears well-developed and well-nourished.  HENT:  Head: Normocephalic.  Nose: Nose normal.  Mouth/Throat: Oropharynx is clear and moist.  Eyes: Conjunctivae are normal. Pupils are equal, round, and reactive to light.  Neck: Normal range of motion. Neck supple. No JVD present.  Cardiovascular: Normal rate, regular rhythm, S1 normal, S2 normal, normal heart sounds and intact distal pulses.  Exam reveals no gallop and no friction rub.   No murmur heard. Pulmonary/Chest: Effort normal and breath sounds normal. No respiratory distress. He has no wheezes. He has no rales. He exhibits no tenderness.  Abdominal: Soft. Bowel sounds are normal. He exhibits no distension. There is no tenderness.  Musculoskeletal: Normal range of motion. He exhibits edema. He exhibits no tenderness.  Lymphadenopathy:    He has no cervical adenopathy.    Neurological: He is alert and oriented to person, place, and time. Coordination normal.  Skin: Skin is warm and dry. No rash noted. No erythema.  Psychiatric: He has a normal mood and affect. His behavior is normal. Judgment and thought content normal.           Assessment and Plan

## 2011-07-20 NOTE — Assessment & Plan Note (Signed)
Continues to be in atrial flutter today. EKG 2 weeks ago suggested normal sinus rhythm. We have suggested he restart warfarin at a lower dose given recent nosebleeds. Goal INR of 2.

## 2011-07-20 NOTE — Assessment & Plan Note (Signed)
We have encouraged continued exercise, careful diet management in an effort to lose weight. 

## 2011-07-20 NOTE — Patient Instructions (Addendum)
You are doing well. Please restart warfarin, Take warfarin 7.5 mg 5 days a week, take 5 mg Monday and Friday  Monitor your heart rate and blood pressure, call the office for signifcant bradycardia and fatigue/dizziness  Please call us if you have new issues that need to be addressed before your next appt.  Your physician wants you to follow-up in: 6 months.  You will receive a reminder letter in the mail two months in advance. If you don't receive a letter, please call our office to schedule the follow-up appointment.

## 2011-07-20 NOTE — Assessment & Plan Note (Signed)
Blood pressure is well controlled on today's visit. No changes made to the medications. 

## 2011-07-29 DIAGNOSIS — H04209 Unspecified epiphora, unspecified lacrimal gland: Secondary | ICD-10-CM | POA: Diagnosis not present

## 2011-08-03 ENCOUNTER — Ambulatory Visit (INDEPENDENT_AMBULATORY_CARE_PROVIDER_SITE_OTHER): Payer: Medicare Other

## 2011-08-03 ENCOUNTER — Other Ambulatory Visit: Payer: Self-pay

## 2011-08-03 DIAGNOSIS — I4892 Unspecified atrial flutter: Secondary | ICD-10-CM

## 2011-08-03 DIAGNOSIS — Z7901 Long term (current) use of anticoagulants: Secondary | ICD-10-CM

## 2011-08-03 LAB — POCT INR: INR: 2.1

## 2011-08-03 MED ORDER — AMLODIPINE BESYLATE 5 MG PO TABS: 5.0000 mg | ORAL_TABLET | Freq: Every day | ORAL | Status: DC

## 2011-08-04 LAB — LIPID PANEL
Cholesterol: 132 mg/dL (ref 0–200)
HDL: 58 mg/dL (ref 35–70)
LDL Cholesterol: 64 mg/dL

## 2011-08-16 ENCOUNTER — Other Ambulatory Visit: Payer: Self-pay | Admitting: Internal Medicine

## 2011-08-17 ENCOUNTER — Ambulatory Visit (INDEPENDENT_AMBULATORY_CARE_PROVIDER_SITE_OTHER): Payer: Medicare Other

## 2011-08-17 DIAGNOSIS — Z7901 Long term (current) use of anticoagulants: Secondary | ICD-10-CM | POA: Diagnosis not present

## 2011-08-17 DIAGNOSIS — I4892 Unspecified atrial flutter: Secondary | ICD-10-CM

## 2011-08-17 LAB — POCT INR: INR: 2.5

## 2011-08-23 DIAGNOSIS — G909 Disorder of the autonomic nervous system, unspecified: Secondary | ICD-10-CM | POA: Diagnosis not present

## 2011-08-23 DIAGNOSIS — L851 Acquired keratosis [keratoderma] palmaris et plantaris: Secondary | ICD-10-CM | POA: Diagnosis not present

## 2011-08-23 DIAGNOSIS — IMO0001 Reserved for inherently not codable concepts without codable children: Secondary | ICD-10-CM | POA: Diagnosis not present

## 2011-08-23 DIAGNOSIS — E1149 Type 2 diabetes mellitus with other diabetic neurological complication: Secondary | ICD-10-CM | POA: Diagnosis not present

## 2011-09-01 DIAGNOSIS — H04559 Acquired stenosis of unspecified nasolacrimal duct: Secondary | ICD-10-CM | POA: Diagnosis not present

## 2011-09-06 ENCOUNTER — Other Ambulatory Visit: Payer: Self-pay

## 2011-09-06 ENCOUNTER — Other Ambulatory Visit: Payer: Self-pay | Admitting: *Deleted

## 2011-09-06 MED ORDER — BLOOD GLUCOSE TEST VI STRP: ORAL_STRIP | Status: DC

## 2011-09-06 MED ORDER — MICROLET LANCETS MISC: Status: DC

## 2011-09-06 MED ORDER — GLUCOSE BLOOD VI STRP: ORAL_STRIP | Status: DC

## 2011-09-06 NOTE — Telephone Encounter (Signed)
Pt rx for glucose test strips printed instead of going electronically.pts wife notified.

## 2011-09-07 ENCOUNTER — Ambulatory Visit (INDEPENDENT_AMBULATORY_CARE_PROVIDER_SITE_OTHER): Payer: Medicare Other

## 2011-09-07 DIAGNOSIS — Z7901 Long term (current) use of anticoagulants: Secondary | ICD-10-CM | POA: Diagnosis not present

## 2011-09-07 DIAGNOSIS — I4892 Unspecified atrial flutter: Secondary | ICD-10-CM | POA: Diagnosis not present

## 2011-09-07 LAB — POCT INR: INR: 2.7

## 2011-09-12 DIAGNOSIS — Z23 Encounter for immunization: Secondary | ICD-10-CM | POA: Diagnosis not present

## 2011-09-15 ENCOUNTER — Telehealth: Payer: Self-pay

## 2011-09-15 NOTE — Telephone Encounter (Signed)
Ok to hold warfarin and aspirin as detailed.

## 2011-09-15 NOTE — Telephone Encounter (Signed)
[facility] asking if ok for pt to hold warfarin x 4 days and ASA x 12 days prior to a procedure ("DCR").

## 2011-09-16 NOTE — Telephone Encounter (Signed)
Will fax letter to [facility]

## 2011-09-16 NOTE — Telephone Encounter (Signed)
Called spoke with pt advised per Dr Mariah Milling ok to start holding ASA today (12 days) prior to eye surgery on 09/27/11, and hold Coumadin last dosage on 09/22/11 (4 days) prior to eye surgery on 09/27/11.  Rescheduled Coumadin f/u appt from 09/28/11 to 10/05/11.  Pt and pt's wife verbalizes understanding of recommendations.

## 2011-09-23 DIAGNOSIS — G4730 Sleep apnea, unspecified: Secondary | ICD-10-CM | POA: Diagnosis not present

## 2011-09-23 DIAGNOSIS — E119 Type 2 diabetes mellitus without complications: Secondary | ICD-10-CM | POA: Diagnosis not present

## 2011-09-23 DIAGNOSIS — I452 Bifascicular block: Secondary | ICD-10-CM | POA: Diagnosis not present

## 2011-09-23 DIAGNOSIS — I428 Other cardiomyopathies: Secondary | ICD-10-CM | POA: Diagnosis not present

## 2011-09-23 DIAGNOSIS — I10 Essential (primary) hypertension: Secondary | ICD-10-CM | POA: Diagnosis not present

## 2011-09-23 DIAGNOSIS — R9431 Abnormal electrocardiogram [ECG] [EKG]: Secondary | ICD-10-CM | POA: Diagnosis not present

## 2011-09-23 DIAGNOSIS — I872 Venous insufficiency (chronic) (peripheral): Secondary | ICD-10-CM | POA: Diagnosis not present

## 2011-09-23 DIAGNOSIS — I440 Atrioventricular block, first degree: Secondary | ICD-10-CM | POA: Diagnosis not present

## 2011-09-23 DIAGNOSIS — Z01810 Encounter for preprocedural cardiovascular examination: Secondary | ICD-10-CM | POA: Diagnosis not present

## 2011-09-23 DIAGNOSIS — I4510 Unspecified right bundle-branch block: Secondary | ICD-10-CM | POA: Diagnosis not present

## 2011-09-23 DIAGNOSIS — L97809 Non-pressure chronic ulcer of other part of unspecified lower leg with unspecified severity: Secondary | ICD-10-CM | POA: Diagnosis not present

## 2011-09-26 ENCOUNTER — Ambulatory Visit (INDEPENDENT_AMBULATORY_CARE_PROVIDER_SITE_OTHER): Payer: Medicare Other | Admitting: Cardiovascular Disease

## 2011-09-26 ENCOUNTER — Encounter: Payer: Self-pay | Admitting: Cardiovascular Disease

## 2011-09-26 VITALS — BP 134/74 | HR 68 | Ht 71.0 in | Wt 231.1 lb

## 2011-09-26 DIAGNOSIS — Z01810 Encounter for preprocedural cardiovascular examination: Secondary | ICD-10-CM

## 2011-09-26 DIAGNOSIS — I4892 Unspecified atrial flutter: Secondary | ICD-10-CM | POA: Diagnosis not present

## 2011-09-26 DIAGNOSIS — R9431 Abnormal electrocardiogram [ECG] [EKG]: Secondary | ICD-10-CM | POA: Diagnosis not present

## 2011-09-26 DIAGNOSIS — E1169 Type 2 diabetes mellitus with other specified complication: Secondary | ICD-10-CM

## 2011-09-26 DIAGNOSIS — E119 Type 2 diabetes mellitus without complications: Secondary | ICD-10-CM | POA: Diagnosis not present

## 2011-09-26 DIAGNOSIS — I10 Essential (primary) hypertension: Secondary | ICD-10-CM | POA: Diagnosis not present

## 2011-09-26 DIAGNOSIS — E669 Obesity, unspecified: Secondary | ICD-10-CM

## 2011-09-26 NOTE — Assessment & Plan Note (Signed)
He was told by anesthesia that he had findings on his EKG he would not be able to undergo general anesthesia. He has right bundle branch block today. Left anterior fascicular block was not documented. He would likely be acceptable risk for upcoming surgery even with general anesthesia

## 2011-09-26 NOTE — Assessment & Plan Note (Signed)
He appears to be in normal sinus rhythm today. No changes to his medications.

## 2011-09-26 NOTE — Assessment & Plan Note (Signed)
He would be acceptable risk given his right bundle branch block, left axis deviation poor upcoming general anesthesia for his eye surgery.

## 2011-09-26 NOTE — Assessment & Plan Note (Signed)
Blood pressure is well controlled on today's visit. No changes made to the medications. 

## 2011-09-26 NOTE — Patient Instructions (Addendum)
You are doing well. No medication changes were made.  We will review your ekg and discuss the findings with anesthesia  Please call us if you have new issues that need to be addressed before your next appt.  Your physician wants you to follow-up in: 6 months.  You will receive a reminder letter in the mail two months in advance. If you don't receive a letter, please call our office to schedule the follow-up appointment. ]

## 2011-09-26 NOTE — Progress Notes (Addendum)
Patient ID: Michael Colon, male    DOB: 06-09-1930, 78 y.o.   MRN: [id number]  HPI Comments: Michael Colon is a very pleasant 78 year old gentleman with chronic atrial fibrillation, history of previous ablation , on warfarin, hypertension, borderline diabetes, Obstructive sleep apnea who presented to [facility] on August 04, 2010 with chest pain. Symptoms started after eating and it was felt he had GI spasm or hiatal hernia. Mild improvement in symptoms with nitroglycerin and GI cocktail in the emergency room. He is very active at baseline. On his last clinic visit he was noted to be in atrial fibrillation/flutter.  Workup in the hospital was essentially negative with normal cardiac enzymes, negative stress test showing no ischemia. Total cholesterol in the hospital was 132, LDL 64, HDL 58  Last seen in the office in July 2013 at which time he was in atrial flutter Today he reports feeling well. He is interested in having his eye surgery performed but was told by anesthesia at [facility] this could not be done and under general anesthesia given his EKG findings. [facility] Otherwise he feels well and has no complaints.  EKG today shows normal sinus rhythm with rate 69 beats per minute, right bundle branch block, left axis deviation  EKG from [facility] dated 09/23/2011 shows atypical atrial flutter best seen in lead V1 and V3, ventricular rate 69 beats per minute, left axis deviation/left anterior fascicular block, right bundle branch block   Outpatient Encounter Prescriptions as of 09/26/2011  Medication Sig Dispense Refill  . albuterol (VENTOLIN HFA) 108 (90 BASE) MCG/ACT inhaler Inhale 2 puffs into the lungs every 6 (six) hours as needed.  8.5 g  11  . amLODipine (NORVASC) 5 MG tablet Take 1 tablet (5 mg total) by mouth daily.  90 tablet  3  . aspirin 81 MG tablet Take 81 mg by mouth daily.        . Calcium Carbonate-Vitamin D (CALCIUM-CARB 600 + D) 600-125 MG-UNIT TABS Take by  mouth.        . docusate sodium (COLACE) 100 MG capsule Take 100 mg by mouth 2 (two) times daily.       . famotidine (PEPCID) 20 MG tablet Take 20 mg by mouth 1 dose over 24 hours.       . finasteride (PROSCAR) 5 MG tablet TAKE 1 TABLET BY MOUTH EVERY DAY  90 tablet  1  . fish oil-omega-3 fatty acids 1000 MG capsule Take 1 g by mouth daily.       . Glucosamine Sulfate-MSM (GLUCOSAMINE-MSM DS) 500-500 MG TABS Take 1,500 mg/day by mouth daily.      Marland Kitchen glucose blood (BAYER CONTOUR TEST) test strip Use as instructed to test blood sugar twice daily dx: 250.00  100 each  3  . loratadine (CLARITIN) 10 MG tablet Take 10 mg by mouth daily.        Marland Kitchen MICROLET LANCETS MISC Use to test twice daily or as directed  100 each  3  . Multiple Vitamins-Minerals (CENTRUM SILVER PO) Take by mouth daily after breakfast.        . nitroGLYCERIN (NITROSTAT) 0.4 MG SL tablet Place 1 tablet (0.4 mg total) under the tongue every 5 (five) minutes as needed for chest pain.  25 tablet  3  . triamterene-hydrochlorothiazide (MAXZIDE) 75-50 MG per tablet TAKE 1 TABLET EVERY DAY  90 tablet  1  . warfarin (COUMADIN) 5 MG tablet TAKE 1 & 1/2 TABLETS DAILY  135 tablet  3    Review of Systems  Constitutional: Negative.   HENT: Negative.   Eyes: Negative.   Respiratory: Negative.   Cardiovascular: Positive for leg swelling.  Gastrointestinal: Negative.   Musculoskeletal: Negative.   Skin: Negative.   Neurological: Negative.   Hematological: Negative.   Psychiatric/Behavioral: Negative.   All other systems reviewed and are negative.    BP 134/74  Pulse 68  Ht 5\' 11"  (1.803 m)  Wt 231 lb 2 oz (104.838 kg)  BMI 32.24 kg/m2  Physical Exam  Nursing note and vitals reviewed. Constitutional: He is oriented to person, place, and time. He appears well-developed and well-nourished.  HENT:  Head: Normocephalic.  Nose: Nose normal.  Mouth/Throat: Oropharynx is clear and moist.  Eyes: Conjunctivae normal are normal. Pupils are  equal, round, and reactive to light.  Neck: Normal range of motion. Neck supple. No JVD present.  Cardiovascular: Normal rate, regular rhythm, S1 normal, S2 normal, normal heart sounds and intact distal pulses.  Exam reveals no gallop and no friction rub.   No murmur heard. Pulmonary/Chest: Effort normal and breath sounds normal. No respiratory distress. He has no wheezes. He has no rales. He exhibits no tenderness.  Abdominal: Soft. Bowel sounds are normal. He exhibits no distension. There is no tenderness.  Musculoskeletal: Normal range of motion. He exhibits edema. He exhibits no tenderness.  Lymphadenopathy:    He has no cervical adenopathy.  Neurological: He is alert and oriented to person, place, and time. Coordination normal.  Skin: Skin is warm and dry. No rash noted. No erythema.  Psychiatric: He has a normal mood and affect. His behavior is normal. Judgment and thought content normal.           Assessment and Plan

## 2011-09-26 NOTE — Assessment & Plan Note (Signed)
We have encouraged continued exercise, careful diet management in an effort to lose weight. 

## 2011-09-28 ENCOUNTER — Telehealth: Payer: Self-pay

## 2011-09-28 NOTE — Telephone Encounter (Signed)
Called spoke with pt, pt went for pre-op at [facility], they did not receive clearance letter sent on 09/16/11 so surgery was cancelled.  Pt saw Dr Mariah Milling on 09/26/11, another letter was sent to Dr [name] giving clearance, advised pt Dr Mariah Milling already cleared pt to hold ASA 12 days prior to procedure and to hold Coumadin 4 days prior to procedure. Per telephone note in EPIC on 09/15/11.  Last dose of ASA on 10/12/11 and last dosage of Coumadin should be on 10/20/11 prior to eye surgery on 10/25/11.  Will need f/u appt for INR check in 7-10 days after procedure once resumes Coumadin per MD instruction.

## 2011-09-28 NOTE — Telephone Encounter (Signed)
Pt scheduled for eye procedure 10/22 Is it ok to have him hold warfarin/ASA prior to?

## 2011-10-02 ENCOUNTER — Other Ambulatory Visit: Payer: Self-pay | Admitting: Internal Medicine

## 2011-10-05 ENCOUNTER — Ambulatory Visit (INDEPENDENT_AMBULATORY_CARE_PROVIDER_SITE_OTHER): Payer: Medicare Other

## 2011-10-05 DIAGNOSIS — Z7901 Long term (current) use of anticoagulants: Secondary | ICD-10-CM | POA: Diagnosis not present

## 2011-10-05 DIAGNOSIS — I4892 Unspecified atrial flutter: Secondary | ICD-10-CM

## 2011-10-05 LAB — POCT INR: INR: 2.6

## 2011-11-02 ENCOUNTER — Ambulatory Visit (INDEPENDENT_AMBULATORY_CARE_PROVIDER_SITE_OTHER): Payer: Medicare Other

## 2011-11-02 DIAGNOSIS — I4892 Unspecified atrial flutter: Secondary | ICD-10-CM | POA: Diagnosis not present

## 2011-11-02 DIAGNOSIS — Z7901 Long term (current) use of anticoagulants: Secondary | ICD-10-CM

## 2011-11-02 LAB — POCT INR: INR: 2.7

## 2011-11-04 HISTORY — PX: TEAR DUCT PROBING: SHX793

## 2011-11-05 ENCOUNTER — Other Ambulatory Visit: Payer: Self-pay | Admitting: Internal Medicine

## 2011-11-09 DIAGNOSIS — I10 Essential (primary) hypertension: Secondary | ICD-10-CM | POA: Diagnosis not present

## 2011-11-09 DIAGNOSIS — Z7901 Long term (current) use of anticoagulants: Secondary | ICD-10-CM | POA: Diagnosis not present

## 2011-11-09 DIAGNOSIS — K219 Gastro-esophageal reflux disease without esophagitis: Secondary | ICD-10-CM | POA: Diagnosis not present

## 2011-11-09 DIAGNOSIS — H04559 Acquired stenosis of unspecified nasolacrimal duct: Secondary | ICD-10-CM | POA: Diagnosis not present

## 2011-11-09 DIAGNOSIS — G4733 Obstructive sleep apnea (adult) (pediatric): Secondary | ICD-10-CM | POA: Diagnosis not present

## 2011-11-09 DIAGNOSIS — J45909 Unspecified asthma, uncomplicated: Secondary | ICD-10-CM | POA: Diagnosis not present

## 2011-11-09 DIAGNOSIS — Z79899 Other long term (current) drug therapy: Secondary | ICD-10-CM | POA: Diagnosis not present

## 2011-11-09 LAB — HM DIABETES EYE EXAM

## 2011-11-10 ENCOUNTER — Encounter: Payer: Medicare Other | Admitting: Internal Medicine

## 2011-11-15 DIAGNOSIS — E1149 Type 2 diabetes mellitus with other diabetic neurological complication: Secondary | ICD-10-CM | POA: Diagnosis not present

## 2011-11-15 DIAGNOSIS — IMO0001 Reserved for inherently not codable concepts without codable children: Secondary | ICD-10-CM | POA: Diagnosis not present

## 2011-11-15 DIAGNOSIS — G909 Disorder of the autonomic nervous system, unspecified: Secondary | ICD-10-CM | POA: Diagnosis not present

## 2011-11-15 DIAGNOSIS — L851 Acquired keratosis [keratoderma] palmaris et plantaris: Secondary | ICD-10-CM | POA: Diagnosis not present

## 2011-11-15 DIAGNOSIS — M76829 Posterior tibial tendinitis, unspecified leg: Secondary | ICD-10-CM | POA: Diagnosis not present

## 2011-11-16 ENCOUNTER — Ambulatory Visit (INDEPENDENT_AMBULATORY_CARE_PROVIDER_SITE_OTHER): Payer: Medicare Other

## 2011-11-16 DIAGNOSIS — I4892 Unspecified atrial flutter: Secondary | ICD-10-CM | POA: Diagnosis not present

## 2011-11-16 DIAGNOSIS — Z7901 Long term (current) use of anticoagulants: Secondary | ICD-10-CM | POA: Diagnosis not present

## 2011-11-16 LAB — POCT INR: INR: 1.5

## 2011-11-30 ENCOUNTER — Ambulatory Visit (INDEPENDENT_AMBULATORY_CARE_PROVIDER_SITE_OTHER): Payer: Medicare Other

## 2011-11-30 DIAGNOSIS — Z7901 Long term (current) use of anticoagulants: Secondary | ICD-10-CM | POA: Diagnosis not present

## 2011-11-30 DIAGNOSIS — I4892 Unspecified atrial flutter: Secondary | ICD-10-CM | POA: Diagnosis not present

## 2011-11-30 LAB — POCT INR: INR: 2.1

## 2011-12-06 ENCOUNTER — Ambulatory Visit (INDEPENDENT_AMBULATORY_CARE_PROVIDER_SITE_OTHER): Payer: Medicare Other | Admitting: Internal Medicine

## 2011-12-06 ENCOUNTER — Encounter: Payer: Self-pay | Admitting: Internal Medicine

## 2011-12-06 VITALS — BP 126/76 | HR 72 | Temp 97.7°F | Ht 71.0 in | Wt 234.0 lb

## 2011-12-06 DIAGNOSIS — E669 Obesity, unspecified: Secondary | ICD-10-CM | POA: Diagnosis not present

## 2011-12-06 DIAGNOSIS — E1169 Type 2 diabetes mellitus with other specified complication: Secondary | ICD-10-CM

## 2011-12-06 DIAGNOSIS — Z0000 Encounter for general adult medical examination without abnormal findings: Secondary | ICD-10-CM

## 2011-12-06 DIAGNOSIS — I4891 Unspecified atrial fibrillation: Secondary | ICD-10-CM | POA: Diagnosis not present

## 2011-12-06 DIAGNOSIS — I10 Essential (primary) hypertension: Secondary | ICD-10-CM

## 2011-12-06 DIAGNOSIS — E782 Mixed hyperlipidemia: Secondary | ICD-10-CM | POA: Insufficient documentation

## 2011-12-06 DIAGNOSIS — E119 Type 2 diabetes mellitus without complications: Secondary | ICD-10-CM | POA: Diagnosis not present

## 2011-12-06 DIAGNOSIS — E785 Hyperlipidemia, unspecified: Secondary | ICD-10-CM

## 2011-12-06 LAB — HEPATIC FUNCTION PANEL
ALT: 19 U/L (ref 0–53)
AST: 18 U/L (ref 0–37)
Albumin: 3.9 g/dL (ref 3.5–5.2)
Alkaline Phosphatase: 60 U/L (ref 39–117)
Bilirubin, Direct: 0.1 mg/dL (ref 0.0–0.3)
Total Bilirubin: 0.9 mg/dL (ref 0.3–1.2)
Total Protein: 6.9 g/dL (ref 6.0–8.3)

## 2011-12-06 LAB — HEMOGLOBIN A1C: Hgb A1c MFr Bld: 7.5 % — ABNORMAL HIGH (ref 4.6–6.5)

## 2011-12-06 LAB — BASIC METABOLIC PANEL
BUN: 23 mg/dL (ref 6–23)
CO2: 31 mEq/L (ref 19–32)
Calcium: 10.3 mg/dL (ref 8.4–10.5)
Chloride: 99 mEq/L (ref 96–112)
Creatinine, Ser: 0.9 mg/dL (ref 0.4–1.5)
GFR: 89.95 mL/min (ref >60.00)
Glucose, Bld: 164 mg/dL — ABNORMAL HIGH (ref 70–99)
Potassium: 4.1 mEq/L (ref 3.5–5.1)
Sodium: 136 mEq/L (ref 135–145)

## 2011-12-06 LAB — CBC WITH DIFFERENTIAL/PLATELET
Basophils Absolute: 0 10*3/uL (ref 0.0–0.1)
Basophils Relative: 0.7 % (ref 0.0–3.0)
Eosinophils Absolute: 0.4 10*3/uL (ref 0.0–0.7)
Eosinophils Relative: 5.7 % — ABNORMAL HIGH (ref 0.0–5.0)
HCT: 48.9 % (ref 39.0–52.0)
Hemoglobin: 16.5 g/dL (ref 13.0–17.0)
Lymphocytes Relative: 21.2 % (ref 12.0–46.0)
Lymphs Abs: 1.5 10*3/uL (ref 0.7–4.0)
MCHC: 33.7 g/dL (ref 30.0–36.0)
MCV: 93.7 fl (ref 78.0–100.0)
Monocytes Absolute: 0.9 10*3/uL (ref 0.1–1.0)
Monocytes Relative: 13 % — ABNORMAL HIGH (ref 3.0–12.0)
Neutro Abs: 4.2 10*3/uL (ref 1.4–7.7)
Neutrophils Relative %: 59.4 % (ref 43.0–77.0)
Platelets: 151 10*3/uL (ref 150.0–400.0)
RBC: 5.22 Mil/uL (ref 4.22–5.81)
RDW: 12.8 % (ref 11.5–14.6)
WBC: 7.1 10*3/uL (ref 4.5–10.5)

## 2011-12-06 LAB — MICROALBUMIN / CREATININE URINE RATIO
Creatinine,U: 108.7 mg/dL
Microalb Creat Ratio: 0.6 mg/g (ref 0.0–30.0)
Microalb, Ur: 0.7 mg/dL (ref 0.0–1.9)

## 2011-12-06 LAB — TSH: TSH: 0.51 u[IU]/mL (ref 0.35–5.50)

## 2011-12-06 NOTE — Assessment & Plan Note (Signed)
Lab Results  Component Value Date   HGBA1C 6.8* 04/12/2011   Will recheck Still seems to have good control

## 2011-12-06 NOTE — Assessment & Plan Note (Signed)
BP Readings from Last 3 Encounters:  12/06/11 126/76  09/26/11 134/74  07/20/11 108/72   Good control No changes needed

## 2011-12-06 NOTE — Assessment & Plan Note (Signed)
Still regular On coumadin 

## 2011-12-06 NOTE — Assessment & Plan Note (Signed)
I have personally reviewed the Medicare Annual Wellness questionnaire and have noted 1. The patient's medical and social history 2. Their use of alcohol, tobacco or illicit drugs 3. Their current medications and supplements 4. The patient's functional ability including ADL's, fall risks, home safety risks and hearing or visual             impairment. 5. Diet and physical activities 6. Evidence for depression or mood disorders  The patients weight, height, BMI and visual acuity have been recorded in the chart I have made referrals, counseling and provided education to the patient based review of the above and I have provided the pt with a written personalized care plan for preventive services.  I have provided you with a copy of your personalized plan for preventive services. Please take the time to review along with your updated medication list.  Doing well UTD on imms No cancer screening indicated now

## 2011-12-06 NOTE — Assessment & Plan Note (Signed)
Last LDL fasting only 64 meds not indicated

## 2011-12-06 NOTE — Progress Notes (Signed)
Subjective:    Patient ID: Michael Colon, male    DOB: 10/19/1930, 78 y.o.   MRN: [id number]  HPI Here for Wellness visit and follow up Reviewed other physicians he sees Had eye surgery last month---may not have resolved tear duct problem Reviewed advanced directives Does exercise daily No depression or anhedonia Did have one fall with wrist sprain---fell on ice. 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 Stress test negative  No prostate problems Voiding fine Nocturia x 1  No wheezing or SOB Doesn't use the inhaler but keeps one around Current Outpatient Prescriptions on File Prior to Visit  Medication Sig Dispense Refill  . albuterol (VENTOLIN HFA) 108 (90 BASE) MCG/ACT inhaler Inhale 2 puffs into the lungs every 6 (six) hours as needed.  8.5 g  11  . amLODipine (NORVASC) 5 MG tablet Take 1 tablet (5 mg total) by mouth daily.  90 tablet  3  . aspirin 81 MG tablet Take 81 mg by mouth daily.        . Calcium Carbonate-Vitamin D (CALCIUM-CARB 600 + D) 600-125 MG-UNIT TABS Take by mouth.        . docusate sodium (COLACE) 100 MG capsule Take 100 mg by mouth 2 (two) times daily.       . famotidine (PEPCID) 20 MG tablet Take 20 mg by mouth 1 dose over 24 hours.       . finasteride (PROSCAR) 5 MG tablet TAKE 1 TABLET BY MOUTH EVERY DAY  90 tablet  1  . fish oil-omega-3 fatty acids 1000 MG capsule Take 1 g by mouth daily.       . Glucosamine Sulfate-MSM (GLUCOSAMINE-MSM DS) 500-500 MG TABS Take 1,500 mg/day by mouth daily.      Marland Kitchen glucose blood (BAYER CONTOUR TEST) test strip Use as instructed to test blood sugar twice daily dx: 250.00  100 each  3  . loratadine  (CLARITIN) 10 MG tablet Take 10 mg by mouth daily.        Marland Kitchen MICROLET LANCETS MISC Use to test twice daily or as directed  100 each  3  . Multiple Vitamins-Minerals (CENTRUM SILVER PO) Take by mouth daily after breakfast.        . nitroGLYCERIN (NITROSTAT) 0.4 MG SL tablet Place 1 tablet (0.4 mg total) under the tongue every 5 (five) minutes as needed for chest pain.  25 tablet  3  . triamterene-hydrochlorothiazide (MAXZIDE) 75-50 MG per tablet TAKE 1 TABLET EVERY DAY  90 tablet  1  . warfarin (COUMADIN) 5 MG tablet TAKE 1 & 1/2 TABLETS DAILY  135 tablet  3    Allergies  Allergen Reactions  . Enoxaparin Sodium Hives  . Latex Rash  . Percocet (Oxycodone-Acetaminophen) Rash    Past Medical History  Diagnosis Date  . Chronic prostatitis   . Obstructive sleep apnea     CPAP-9  . Chronic ear infection   . Asthma   . GERD (gastroesophageal reflux disease)     laryngeal involvement  . Hypertension   . Osteoarthrosis, localized, primary, knee     post-traumatic  . Diabetes mellitus   . Tachy-brady syndrome   . A-fib 2007  . Benign prostatic hypertrophy   .  Hyperlipidemia     Past Surgical History  Procedure Date  . Mastoidectomy 8/08    Dr [name]  . Rhinoplasty 5/10    and septoplasty  . Cataract extraction, bilateral 2009  . Belpharoptosis repair     Dr [name]---didn't resolve weepy eye and eyelid drooping  . Knee surgery 1998    plate after fracture, then removed for infection  . Cardioversion 04/13/2010  . Chest pain 8/12    Stress test benign  . Appendectomy   . Left elbow surgery   . Venous ablation   . Tear duct probing 11/13    Dr [name]    Family History  Problem Relation Age of Onset  . Other Mother     natural causes  . Colon cancer Father   . Diabetes Father     History   Social History  . Marital Status: Married    Spouse Name: N/A    Number of Children: 2  . Years of Education: N/A   Occupational History  . 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 Smoking status: Former Smoker -- 1.0 packs/day for 0 years    Types: Cigarettes    Quit date: 01/03/1969  . Smokeless tobacco: Never Used  . Alcohol Use: 0.6 oz/week    1 Glasses of wine per week     Comment: rare wine  . Drug Use: No  . Sexually Active: Not on file   Other Topics Concern  . Not on file   Social History Narrative   Has living willHealth care POA-- wife and then son Bryson Dames DNR order from the past---form redone 6/22/12Not sure about tube feedings   Review of Systems Weight is stable Appetite is fine Sleeps well Bowels are fine May be considering knee replacements---glucosamine does help     Objective:   Physical Exam  Constitutional: He is oriented to person, place, and time. He appears well-developed and well-nourished. No distress.  Neck: Normal range of motion. Neck supple. No thyromegaly present.  Cardiovascular: Normal rate, regular rhythm, normal heart sounds and intact distal pulses.  Exam reveals no gallop.   No murmur heard.      Faint pedal pulses  Pulmonary/Chest: Effort normal and breath sounds normal. No respiratory distress. He has no wheezes. He has no rales.  Abdominal: Soft. There is no tenderness.  Musculoskeletal:       Trace ankle edema  Lymphadenopathy:    He has no cervical adenopathy.  Neurological: He is alert and oriented to person, place, and time.       President-- "Obama, Bush, Clinton" [phone] [id number] Recall 2/3  Skin: No rash noted. No erythema.       No foot lesions  Psychiatric: He has a normal mood and affect. His behavior is normal.          Assessment & Plan:

## 2011-12-21 ENCOUNTER — Ambulatory Visit (INDEPENDENT_AMBULATORY_CARE_PROVIDER_SITE_OTHER): Payer: Medicare Other

## 2011-12-21 DIAGNOSIS — I4892 Unspecified atrial flutter: Secondary | ICD-10-CM

## 2011-12-21 DIAGNOSIS — Z7901 Long term (current) use of anticoagulants: Secondary | ICD-10-CM

## 2011-12-21 LAB — POCT INR: INR: 4.9

## 2012-01-05 ENCOUNTER — Ambulatory Visit (INDEPENDENT_AMBULATORY_CARE_PROVIDER_SITE_OTHER): Payer: Medicare Other

## 2012-01-05 DIAGNOSIS — Z7901 Long term (current) use of anticoagulants: Secondary | ICD-10-CM

## 2012-01-05 DIAGNOSIS — I4892 Unspecified atrial flutter: Secondary | ICD-10-CM | POA: Diagnosis not present

## 2012-01-05 LAB — POCT INR: INR: 2.5

## 2012-01-25 ENCOUNTER — Ambulatory Visit (INDEPENDENT_AMBULATORY_CARE_PROVIDER_SITE_OTHER): Payer: Medicare Other

## 2012-01-25 DIAGNOSIS — Z7901 Long term (current) use of anticoagulants: Secondary | ICD-10-CM | POA: Diagnosis not present

## 2012-01-25 DIAGNOSIS — I4892 Unspecified atrial flutter: Secondary | ICD-10-CM | POA: Diagnosis not present

## 2012-01-25 LAB — POCT INR: INR: 2.8

## 2012-02-07 DIAGNOSIS — IMO0001 Reserved for inherently not codable concepts without codable children: Secondary | ICD-10-CM | POA: Diagnosis not present

## 2012-02-07 DIAGNOSIS — E1149 Type 2 diabetes mellitus with other diabetic neurological complication: Secondary | ICD-10-CM | POA: Diagnosis not present

## 2012-02-07 DIAGNOSIS — G909 Disorder of the autonomic nervous system, unspecified: Secondary | ICD-10-CM | POA: Diagnosis not present

## 2012-02-07 DIAGNOSIS — B351 Tinea unguium: Secondary | ICD-10-CM | POA: Diagnosis not present

## 2012-02-07 DIAGNOSIS — L851 Acquired keratosis [keratoderma] palmaris et plantaris: Secondary | ICD-10-CM | POA: Diagnosis not present

## 2012-02-22 ENCOUNTER — Ambulatory Visit (INDEPENDENT_AMBULATORY_CARE_PROVIDER_SITE_OTHER): Payer: Medicare Other

## 2012-02-22 DIAGNOSIS — I4892 Unspecified atrial flutter: Secondary | ICD-10-CM | POA: Diagnosis not present

## 2012-02-22 DIAGNOSIS — Z7901 Long term (current) use of anticoagulants: Secondary | ICD-10-CM

## 2012-02-22 LAB — POCT INR: INR: 2.5

## 2012-03-28 ENCOUNTER — Ambulatory Visit (INDEPENDENT_AMBULATORY_CARE_PROVIDER_SITE_OTHER): Payer: Medicare Other | Admitting: Cardiovascular Disease

## 2012-03-28 ENCOUNTER — Encounter: Payer: Self-pay | Admitting: Cardiovascular Disease

## 2012-03-28 ENCOUNTER — Ambulatory Visit (INDEPENDENT_AMBULATORY_CARE_PROVIDER_SITE_OTHER): Payer: Medicare Other

## 2012-03-28 VITALS — BP 120/80 | HR 75 | Ht 71.0 in | Wt 243.0 lb

## 2012-03-28 DIAGNOSIS — E119 Type 2 diabetes mellitus without complications: Secondary | ICD-10-CM

## 2012-03-28 DIAGNOSIS — E669 Obesity, unspecified: Secondary | ICD-10-CM

## 2012-03-28 DIAGNOSIS — I4892 Unspecified atrial flutter: Secondary | ICD-10-CM

## 2012-03-28 DIAGNOSIS — E785 Hyperlipidemia, unspecified: Secondary | ICD-10-CM | POA: Diagnosis not present

## 2012-03-28 DIAGNOSIS — Z7901 Long term (current) use of anticoagulants: Secondary | ICD-10-CM | POA: Diagnosis not present

## 2012-03-28 DIAGNOSIS — I10 Essential (primary) hypertension: Secondary | ICD-10-CM

## 2012-03-28 DIAGNOSIS — E1169 Type 2 diabetes mellitus with other specified complication: Secondary | ICD-10-CM

## 2012-03-28 DIAGNOSIS — I4891 Unspecified atrial fibrillation: Secondary | ICD-10-CM

## 2012-03-28 LAB — POCT INR: INR: 2.2

## 2012-03-28 MED ORDER — TRIAMTERENE-HCTZ 75-50 MG PO TABS: 1.0000 | ORAL_TABLET | Freq: Every day | ORAL | Status: DC

## 2012-03-28 MED ORDER — AMLODIPINE BESYLATE 5 MG PO TABS: 5.0000 mg | ORAL_TABLET | Freq: Every day | ORAL | Status: DC

## 2012-03-28 NOTE — Assessment & Plan Note (Signed)
Cholesterol is at goal on the current lipid regimen. No changes to the medications were made.  

## 2012-03-28 NOTE — Patient Instructions (Addendum)
You are doing well. No medication changes were made.  Please call us if you have new issues that need to be addressed before your next appt.  Your physician wants you to follow-up in: 12 months.  You will receive a reminder letter in the mail two months in advance. If you don't receive a letter, please call our office to schedule the follow-up appointment. 

## 2012-03-28 NOTE — Assessment & Plan Note (Signed)
Hemoglobin A1c is slowly climbing. We have encouraged him to increase his walking and watch his diet.

## 2012-03-28 NOTE — Assessment & Plan Note (Signed)
Currently on anticoagulation. Asymptomatic. Rate is well controlled

## 2012-03-28 NOTE — Assessment & Plan Note (Signed)
Blood pressure is well controlled on today's visit. No changes made to the medications. 

## 2012-03-28 NOTE — Progress Notes (Signed)
Patient ID: Michael Colon, male    DOB: 07/31/1930, 79 y.o.   MRN: [id number]  HPI Comments: Michael Colon is a very pleasant 79 year old gentleman with chronic atrial flutter, history of previous ablation , on warfarin, hypertension, borderline diabetes, Obstructive sleep apnea who presented to [facility] on August 04, 2010 with chest pain. Symptoms started after eating and it was felt he had GI spasm or hiatal hernia. Mild improvement in symptoms with nitroglycerin and GI cocktail in the emergency room. He is very active at baseline. On his last clinic visit he was noted to be in atrial fibrillation/flutter. Workup in the hospital was essentially negative with normal cardiac enzymes, negative stress test showing no ischemia. Total cholesterol in the hospital was 132, LDL 64, HDL 58  Last seen in the office in July 2013 at which time he was in atrial flutter He is active, reports his blood pressure is well controlled. Hemoglobin A1c 7.5. Last total cholesterol 132, LDL 64. He denies any shortness of breath. Weight has been slowly trending up. He currently lives at [facility] and is active in photography. No significant edema  EKG today shows atrial flutter with ventricular rate 75 beats per minute, right bundle-branch block, left anterior fascicular block   Outpatient Encounter Prescriptions as of 03/28/2012  Medication Sig Dispense Refill  . albuterol (VENTOLIN HFA) 108 (90 BASE) MCG/ACT inhaler Inhale 2 puffs into the lungs every 6 (six) hours as needed.  8.5 g  11  . amLODipine (NORVASC) 5 MG tablet Take 1 tablet (5 mg total) by mouth daily.  90 tablet  3  . aspirin 81 MG tablet Take 81 mg by mouth daily.        . Calcium Carbonate-Vitamin D (CALCIUM-CARB 600 + D) 600-125 MG-UNIT TABS Take by mouth.        . docusate sodium (COLACE) 100 MG capsule Take 100 mg by mouth 2 (two) times daily.       . famotidine (PEPCID) 20 MG tablet Take 20 mg by mouth 1 dose over 24 hours.       . finasteride  (PROSCAR) 5 MG tablet TAKE 1 TABLET BY MOUTH EVERY DAY  90 tablet  1  . fish oil-omega-3 fatty acids 1000 MG capsule Take 1 g by mouth daily.       . Glucosamine Sulfate-MSM (GLUCOSAMINE-MSM DS) 500-500 MG TABS Take 1,500 mg/day by mouth daily.      Marland Kitchen glucose blood (BAYER CONTOUR TEST) test strip Use as instructed to test blood sugar twice daily dx: 250.00  100 each  3  . loratadine (CLARITIN) 10 MG tablet Take 10 mg by mouth daily.        Marland Kitchen MICROLET LANCETS MISC Use to test twice daily or as directed  100 each  3  . Multiple Vitamins-Minerals (CENTRUM SILVER PO) Take by mouth daily after breakfast.        . nitroGLYCERIN (NITROSTAT) 0.4 MG SL tablet Place 1 tablet (0.4 mg total) under the tongue every 5 (five) minutes as needed for chest pain.  25 tablet  3  . [DISCONTINUED] amLODipine (NORVASC) 5 MG tablet Take 1 tablet (5 mg total) by mouth daily.  90 tablet  3  . [DISCONTINUED] triamterene-hydrochlorothiazide (MAXZIDE) 75-50 MG per tablet TAKE 1 TABLET EVERY DAY  90 tablet  1  . triamterene-hydrochlorothiazide (MAXZIDE) 75-50 MG per tablet Take 1 tablet by mouth daily.  90 tablet  3  . warfarin (COUMADIN) 5 MG tablet TAKE 1 & 1/2  TABLETS DAILY  135 tablet  3   No facility-administered encounter medications on file as of 03/28/2012.    Review of Systems  Constitutional: Negative.   HENT: Negative.   Eyes: Negative.   Respiratory: Negative.   Cardiovascular: Positive for leg swelling.  Gastrointestinal: Negative.   Musculoskeletal: Negative.   Skin: Negative.   Neurological: Negative.   Psychiatric/Behavioral: Negative.   All other systems reviewed and are negative.    BP 120/80  Pulse 75  Ht 5\' 11"  (1.803 m)  Wt 243 lb (110.224 kg)  BMI 33.91 kg/m2  Physical Exam  Nursing note and vitals reviewed. Constitutional: He is oriented to person, place, and time. He appears well-developed and well-nourished.  HENT:  Head: Normocephalic.  Nose: Nose normal.  Mouth/Throat:  Oropharynx is clear and moist.  Eyes: Conjunctivae are normal. Pupils are equal, round, and reactive to light.  Neck: Normal range of motion. Neck supple. No JVD present.  Cardiovascular: Normal rate, regular rhythm, S1 normal, S2 normal, normal heart sounds and intact distal pulses.  Exam reveals no gallop and no friction rub.   No murmur heard. Pulmonary/Chest: Effort normal and breath sounds normal. No respiratory distress. He has no wheezes. He has no rales. He exhibits no tenderness.  Abdominal: Soft. Bowel sounds are normal. He exhibits no distension. There is no tenderness.  Musculoskeletal: Normal range of motion. He exhibits edema. He exhibits no tenderness.  Lymphadenopathy:    He has no cervical adenopathy.  Neurological: He is alert and oriented to person, place, and time. Coordination normal.  Skin: Skin is warm and dry. No rash noted. No erythema.  Psychiatric: He has a normal mood and affect. His behavior is normal. Judgment and thought content normal.      Assessment and Plan

## 2012-05-07 DIAGNOSIS — E1149 Type 2 diabetes mellitus with other diabetic neurological complication: Secondary | ICD-10-CM | POA: Diagnosis not present

## 2012-05-07 DIAGNOSIS — B351 Tinea unguium: Secondary | ICD-10-CM | POA: Diagnosis not present

## 2012-05-07 DIAGNOSIS — IMO0001 Reserved for inherently not codable concepts without codable children: Secondary | ICD-10-CM | POA: Diagnosis not present

## 2012-05-07 DIAGNOSIS — L851 Acquired keratosis [keratoderma] palmaris et plantaris: Secondary | ICD-10-CM | POA: Diagnosis not present

## 2012-05-07 DIAGNOSIS — G909 Disorder of the autonomic nervous system, unspecified: Secondary | ICD-10-CM | POA: Diagnosis not present

## 2012-05-08 ENCOUNTER — Encounter: Payer: Medicare Other | Admitting: Internal Medicine

## 2012-05-09 ENCOUNTER — Ambulatory Visit (INDEPENDENT_AMBULATORY_CARE_PROVIDER_SITE_OTHER): Payer: Medicare Other

## 2012-05-09 DIAGNOSIS — I4892 Unspecified atrial flutter: Secondary | ICD-10-CM

## 2012-05-09 DIAGNOSIS — Z7901 Long term (current) use of anticoagulants: Secondary | ICD-10-CM | POA: Diagnosis not present

## 2012-05-09 LAB — POCT INR: INR: 2.4

## 2012-05-11 ENCOUNTER — Other Ambulatory Visit: Payer: Self-pay | Admitting: Internal Medicine

## 2012-05-17 ENCOUNTER — Other Ambulatory Visit: Payer: Self-pay

## 2012-05-17 MED ORDER — GLUCOSE BLOOD VI STRP: ORAL_STRIP | Status: DC

## 2012-05-17 NOTE — Telephone Encounter (Signed)
Pt request refill contour test strips to [facility] [address] (pt has changed pharmacies). Pt notified done.

## 2012-06-05 ENCOUNTER — Ambulatory Visit (INDEPENDENT_AMBULATORY_CARE_PROVIDER_SITE_OTHER): Payer: Medicare Other | Admitting: Internal Medicine

## 2012-06-05 ENCOUNTER — Encounter: Payer: Self-pay | Admitting: Internal Medicine

## 2012-06-05 VITALS — BP 120/80 | HR 73 | Temp 97.5°F | Wt 235.0 lb

## 2012-06-05 DIAGNOSIS — E785 Hyperlipidemia, unspecified: Secondary | ICD-10-CM

## 2012-06-05 DIAGNOSIS — E119 Type 2 diabetes mellitus without complications: Secondary | ICD-10-CM

## 2012-06-05 DIAGNOSIS — E669 Obesity, unspecified: Secondary | ICD-10-CM

## 2012-06-05 DIAGNOSIS — I4892 Unspecified atrial flutter: Secondary | ICD-10-CM | POA: Diagnosis not present

## 2012-06-05 DIAGNOSIS — M25511 Pain in right shoulder: Secondary | ICD-10-CM | POA: Insufficient documentation

## 2012-06-05 DIAGNOSIS — E1169 Type 2 diabetes mellitus with other specified complication: Secondary | ICD-10-CM

## 2012-06-05 DIAGNOSIS — I10 Essential (primary) hypertension: Secondary | ICD-10-CM | POA: Diagnosis not present

## 2012-06-05 DIAGNOSIS — M25519 Pain in unspecified shoulder: Secondary | ICD-10-CM

## 2012-06-05 LAB — HEMOGLOBIN A1C: Hgb A1c MFr Bld: 7.5 % — ABNORMAL HIGH (ref 4.6–6.5)

## 2012-06-05 NOTE — Assessment & Plan Note (Signed)
Chronic and affecting sleep Will go ahead with cortisone injection  PROCEDURE Sterile prep to posterior right shoulder Ethyl chloride then 1cc 2% plain lido 40mg  depomedron and 6cc 2% lido instilled Tolerated well Discussed home care

## 2012-06-05 NOTE — Progress Notes (Signed)
Subjective:    Patient ID: Michael Colon, male    DOB: 1930/08/19, 79 y.o.   MRN: [id number]  HPI Concerned about arthritis in right 2nd MCPs in hand Swelling and pain Does use it a lot with a mouse---uses computer for hours  Right proximal arm, and down completely to wrist, is sore especially in bed Awakens with it hurting The shoulder itself isn't that painful Some restriction in ROM Does have pain in the day as well Uses ibuprofen at night  Checks sugars once a day--or occasionally skips Higher recently--like 150. Down to 118 again today Tries to be consistent with his eating--has cut out desserts  No chest pain No SOB No palpitations  Current Outpatient Prescriptions on File Prior to Visit  Medication Sig Dispense Refill  . albuterol (VENTOLIN HFA) 108 (90 BASE) MCG/ACT inhaler Inhale 2 puffs into the lungs every 6 (six) hours as needed.  8.5 g  11  . amLODipine (NORVASC) 5 MG tablet Take 1 tablet (5 mg total) by mouth daily.  90 tablet  3  . aspirin 81 MG tablet Take 81 mg by mouth daily.        . Calcium Carbonate-Vitamin D (CALCIUM-CARB 600 + D) 600-125 MG-UNIT TABS Take by mouth.        . docusate sodium (COLACE) 100 MG capsule Take 100 mg by mouth 2 (two) times daily.       . famotidine (PEPCID) 20 MG tablet Take 20 mg by mouth 1 dose over 24 hours.       . finasteride (PROSCAR) 5 MG tablet TAKE ONE TABLET BY MOUTH EVERY DAY  90 tablet  0  . fish oil-omega-3 fatty acids 1000 MG capsule Take 1 g by mouth daily.       . Glucosamine Sulfate-MSM (GLUCOSAMINE-MSM DS) 500-500 MG TABS Take 1,500 mg/day by mouth daily.      Marland Kitchen glucose blood (BAYER CONTOUR TEST) test strip Use as instructed to test blood sugar twice daily dx: 250.00  100 each  1  . loratadine (CLARITIN) 10 MG tablet Take 10 mg by mouth daily.        Marland Kitchen MICROLET LANCETS MISC Use to test twice daily or as directed  100 each  3  . Multiple Vitamins-Minerals (CENTRUM SILVER PO) Take by mouth daily after  breakfast.        . triamterene-hydrochlorothiazide (MAXZIDE) 75-50 MG per tablet Take 1 tablet by mouth daily.  90 tablet  3  . warfarin (COUMADIN) 5 MG tablet TAKE 1 & 1/2 TABLETS DAILY  135 tablet  3   No current facility-administered medications on file prior to visit.    Allergies  Allergen Reactions  . Enoxaparin Sodium Hives  . Latex Rash  . Percocet (Oxycodone-Acetaminophen) Rash    Past Medical History  Diagnosis Date  . Chronic prostatitis   . Obstructive sleep apnea     CPAP-9  . Chronic ear infection   . Asthma   . GERD (gastroesophageal reflux disease)     laryngeal involvement  . Hypertension   . Osteoarthrosis, localized, primary, knee     post-traumatic  . Diabetes mellitus   . Tachy-brady syndrome   . Atrial flutter 2007  . Benign prostatic hypertrophy   . Hyperlipidemia     Past Surgical History  Procedure Laterality Date  . Mastoidectomy  8/08    Dr [name]  . Rhinoplasty  5/10    and septoplasty  . Cataract extraction, bilateral  2009  . Belpharoptosis  repair      Dr [name]---didn't resolve weepy eye and eyelid drooping  . Knee surgery  1998    plate after fracture, then removed for infection  . Cardioversion  04/13/2010  . Chest pain  8/12    Stress test benign  . Appendectomy    . Left elbow surgery    . Venous ablation    . Tear duct probing  11/13    Dr [name]  . Subacromial decompression Right 2005    Arthroscopic (for rotator cuff and biceps tendon ruptures)    Family History  Problem Relation Age of Onset  . Other Mother     natural causes  . Colon cancer Father   . Diabetes Father     History   Social History  . Marital Status: Married    Spouse Name: N/A    Number of Children: 2  . 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 Smoking status: Former Smoker -- 1.00 packs/day for 0 years    Types: Cigarettes    Quit date: 01/03/1969  . Smokeless  tobacco: Never Used  . Alcohol Use: 0.6 oz/week    1 Glasses of wine per week     Comment: rare wine  . Drug Use: No  . Sexually Active: Not on file   Other Topics Concern  . Not on file   Social History Narrative   Has living will   Health care POA-- wife and then son Onalee Hua   Has DNR order from the past---form redone 06/25/10   Not sure about tube feedings   Review of Systems Weight was up and now down again [facility] some every day (2-4 miles)    Objective:   Physical Exam  Constitutional: He appears well-developed and well-nourished. No distress.  Neck: Normal range of motion. Neck supple. No thyromegaly present.  Cardiovascular: Normal rate, regular rhythm, normal heart sounds and intact distal pulses.  Exam reveals no gallop.   No murmur heard. Pulmonary/Chest: Effort normal and breath sounds normal. No respiratory distress. He has no wheezes. He has no rales.  Musculoskeletal: He exhibits no edema and no tenderness.  Thickening of right 2nd MCP without acute inflammation  Right shoulder without effusion Some decreased ROM with pain at ends of motion throughout  Lymphadenopathy:    He has no cervical adenopathy.  Psychiatric: He has a normal mood and affect. His behavior is normal.          Assessment & Plan:

## 2012-06-05 NOTE — Assessment & Plan Note (Signed)
Lab Results  Component Value Date   LDLCALC 64 08/04/2011   Good control on his meds

## 2012-06-05 NOTE — Assessment & Plan Note (Signed)
Sugars up a little Discussed continued lifestyle measures to have more weight loss Will check A1c

## 2012-06-05 NOTE — Assessment & Plan Note (Signed)
BP Readings from Last 3 Encounters:  06/05/12 120/80  03/28/12 120/80  12/06/11 126/76   Good control No changes needed

## 2012-06-05 NOTE — Patient Instructions (Signed)
I recommend Drs Camera operator and [facility] in [place] as dermatologists.

## 2012-06-05 NOTE — Assessment & Plan Note (Signed)
Sees Dr Michae Kava on coumadin

## 2012-06-06 DIAGNOSIS — R209 Unspecified disturbances of skin sensation: Secondary | ICD-10-CM | POA: Diagnosis not present

## 2012-06-06 DIAGNOSIS — Z85828 Personal history of other malignant neoplasm of skin: Secondary | ICD-10-CM | POA: Diagnosis not present

## 2012-06-06 DIAGNOSIS — L981 Factitial dermatitis: Secondary | ICD-10-CM | POA: Diagnosis not present

## 2012-06-08 DIAGNOSIS — H26499 Other secondary cataract, unspecified eye: Secondary | ICD-10-CM | POA: Diagnosis not present

## 2012-06-20 ENCOUNTER — Ambulatory Visit (INDEPENDENT_AMBULATORY_CARE_PROVIDER_SITE_OTHER): Payer: Medicare Other

## 2012-06-20 DIAGNOSIS — I4892 Unspecified atrial flutter: Secondary | ICD-10-CM

## 2012-06-20 DIAGNOSIS — Z7901 Long term (current) use of anticoagulants: Secondary | ICD-10-CM | POA: Diagnosis not present

## 2012-06-20 LAB — POCT INR: INR: 2.1

## 2012-07-03 LAB — HM DIABETES EYE EXAM

## 2012-07-31 DIAGNOSIS — E1149 Type 2 diabetes mellitus with other diabetic neurological complication: Secondary | ICD-10-CM | POA: Diagnosis not present

## 2012-07-31 DIAGNOSIS — L851 Acquired keratosis [keratoderma] palmaris et plantaris: Secondary | ICD-10-CM | POA: Diagnosis not present

## 2012-07-31 DIAGNOSIS — G909 Disorder of the autonomic nervous system, unspecified: Secondary | ICD-10-CM | POA: Diagnosis not present

## 2012-07-31 DIAGNOSIS — IMO0001 Reserved for inherently not codable concepts without codable children: Secondary | ICD-10-CM | POA: Diagnosis not present

## 2012-07-31 DIAGNOSIS — B351 Tinea unguium: Secondary | ICD-10-CM | POA: Diagnosis not present

## 2012-08-01 ENCOUNTER — Ambulatory Visit (INDEPENDENT_AMBULATORY_CARE_PROVIDER_SITE_OTHER): Payer: Medicare Other | Admitting: *Deleted

## 2012-08-01 DIAGNOSIS — Z7901 Long term (current) use of anticoagulants: Secondary | ICD-10-CM | POA: Diagnosis not present

## 2012-08-01 DIAGNOSIS — I4892 Unspecified atrial flutter: Secondary | ICD-10-CM

## 2012-08-01 LAB — POCT INR: INR: 2.2

## 2012-08-07 DIAGNOSIS — L738 Other specified follicular disorders: Secondary | ICD-10-CM | POA: Diagnosis not present

## 2012-08-10 ENCOUNTER — Other Ambulatory Visit: Payer: Self-pay | Admitting: Internal Medicine

## 2012-08-27 ENCOUNTER — Telehealth: Payer: Self-pay

## 2012-08-27 NOTE — Telephone Encounter (Signed)
Pt. called c/o low pulse rate(40's). Spoke with Dr. Mariah Milling, recommeded an EKG here in office tomorrow. Advised patient to go to ER if symptomatic before tomorrow. Appt.08-28-12 2:45

## 2012-08-28 ENCOUNTER — Ambulatory Visit: Payer: Medicare Other | Admitting: Physician Assistant

## 2012-08-28 ENCOUNTER — Ambulatory Visit (INDEPENDENT_AMBULATORY_CARE_PROVIDER_SITE_OTHER): Payer: Medicare Other

## 2012-08-28 VITALS — BP 116/68 | HR 45 | Wt 238.2 lb

## 2012-08-28 DIAGNOSIS — R0989 Other specified symptoms and signs involving the circulatory and respiratory systems: Secondary | ICD-10-CM

## 2012-08-28 DIAGNOSIS — I4892 Unspecified atrial flutter: Secondary | ICD-10-CM | POA: Diagnosis not present

## 2012-08-28 DIAGNOSIS — Z7901 Long term (current) use of anticoagulants: Secondary | ICD-10-CM

## 2012-08-28 LAB — POCT INR: INR: 2.5

## 2012-08-28 NOTE — Patient Instructions (Addendum)
Dr. Kirke Corin reviewed EKG as Dr. Mariah Milling is out of the office this week.   Appt w/ Dr. Mariah Milling September 3 @ 11:15.  [facility] will call with holter instructions.

## 2012-08-30 ENCOUNTER — Other Ambulatory Visit: Payer: Self-pay

## 2012-08-30 DIAGNOSIS — R001 Bradycardia, unspecified: Secondary | ICD-10-CM

## 2012-08-31 ENCOUNTER — Telehealth: Payer: Self-pay | Admitting: *Deleted

## 2012-08-31 NOTE — Telephone Encounter (Signed)
Spoke w/ pt.  He is aware that someone from [facility] will call him on Tuesday or Wednesday, due to the holiday on Monday.  Pt is in agreement with this plan.

## 2012-08-31 NOTE — Telephone Encounter (Signed)
Patient hasn't heard from [facility] in regards to his heart monitor. Please advise

## 2012-09-04 DIAGNOSIS — I498 Other specified cardiac arrhythmias: Secondary | ICD-10-CM | POA: Diagnosis not present

## 2012-09-05 ENCOUNTER — Ambulatory Visit: Payer: Medicare Other | Admitting: Physician Assistant

## 2012-09-05 ENCOUNTER — Ambulatory Visit: Payer: Medicare Other | Admitting: Cardiovascular Disease

## 2012-09-05 DIAGNOSIS — I498 Other specified cardiac arrhythmias: Secondary | ICD-10-CM | POA: Diagnosis not present

## 2012-09-07 ENCOUNTER — Encounter: Payer: Self-pay | Admitting: Physician Assistant

## 2012-09-07 ENCOUNTER — Ambulatory Visit (INDEPENDENT_AMBULATORY_CARE_PROVIDER_SITE_OTHER): Payer: Medicare Other | Admitting: Physician Assistant

## 2012-09-07 VITALS — BP 124/70 | HR 42 | Ht 71.0 in | Wt 235.8 lb

## 2012-09-07 DIAGNOSIS — I4892 Unspecified atrial flutter: Secondary | ICD-10-CM | POA: Diagnosis not present

## 2012-09-07 DIAGNOSIS — R609 Edema, unspecified: Secondary | ICD-10-CM | POA: Diagnosis not present

## 2012-09-07 DIAGNOSIS — R600 Localized edema: Secondary | ICD-10-CM

## 2012-09-07 DIAGNOSIS — I4891 Unspecified atrial fibrillation: Secondary | ICD-10-CM

## 2012-09-07 DIAGNOSIS — R001 Bradycardia, unspecified: Secondary | ICD-10-CM | POA: Insufficient documentation

## 2012-09-07 DIAGNOSIS — I498 Other specified cardiac arrhythmias: Secondary | ICD-10-CM

## 2012-09-07 NOTE — Patient Instructions (Addendum)
We will obtain labwork today to check- electrolytes, thyroid markers, magnesium and blood counts.   Please call us if you develop symptoms associated with low heart rate (bradycardia)- lightheadedness, passing out, shortness of breath, chest pain, fatigue or low energy. If these symptoms occur, we will refer you to an electrophysiologist.   Please elevate your legs and continue to watch salt intake.   We will see you back in 1 month.

## 2012-09-07 NOTE — Assessment & Plan Note (Addendum)
No JVD or adventitious breath sounds. H/o chronic venous insufficiency s/p vein stripping. Patient has gained some weight and notes mildly increased DOE. Advised salt restriction, leg elevation, compression stockings and dietary modifications. Continue Maxzide for diuretic component.

## 2012-09-07 NOTE — Assessment & Plan Note (Signed)
Sinus bradycardia on EKG today. INRs therapeutic. Continue Coumadin.

## 2012-09-07 NOTE — Assessment & Plan Note (Addendum)
The patient's HR drops to 20-30s while asleep. This increases appropriately (50-80s) upon awaking and ambulating. Suspect a component of vagal-mediated bradycardia overnight. He has been largely asymptomatic from this. He does note fatigue and somnolence. This very well may be due to normal aging over bradycardia. No presyncope, syncope or palpitations. Will check TSH, BMET, Mg, CBC to r/o alternative causes. Given 1st degree AVB, RBBB and LAFB, sick sinus syndrome may ensue as the result of progressive conduction system disease. No clear indication for PPM at this time. Discussed with Dr. Mariah Milling, will plan EP referral should the patient develop symptoms. Continue CPAP for OSA. No AVN blockers.

## 2012-09-07 NOTE — Progress Notes (Signed)
Patient ID: Michael Colon, male   DOB: 20-Jun-1930, 79 y.o.   MRN: [id number]            Date:  09/07/2012   ID:  Michael Colon, DOB 03/13/1930, MRN [id number]  PCP:  Tillman Abide, MD  Primary Cardiologist:  Concha Se, MD  History of Present Illness: Michael Colon is a 79 y.o. male w/ PMHx s/f permanent atrial flutter (s/p prior RFA, on Coumadin), DM2, HTN, HLD, OSA, GERD and BPH who presents today for follow-up.   He last followed up with Dr. Mariah Milling 03/2012. He was in rate-controlled atrial flutter (not on any AVN blockers) with therapeutic INRs. BP and lipids were well-controlled. He was deemed to be stable overall.   He called the office 8/25 reporting HRs in the 40s. He came in the following day for an office EKG which was reviewed by Dr. Kirke Corin. Tracing unavailable on Epic. Holter monitoring was ordered and scheduled to be applied earlier this week. This was recorded 24 hours of activity and revealed sinus bradycardia, 1st degree AVB, HR nadir 28 bpm, with several pauses > 2 s, longest 2.86 s. Compensatory pauses occur after PVCs. HR dropped overnight and appropriately increased in the morning and throughout the day (50-mid 80s). There were occasional PVCs.  He denies lightheadedness, palpitations or syncope. He reports experiencing progressively reduced energy and somnolence. He takes three 8 minute naps per day. He has gained some weight and notes mildly increased DOE on climbing stairs. Denies chest pain, PND, orthopnea. He has chronic LE edema. H/o CVI with vein stripping. He continues to wear CPAP. No apneic or snoring episodes overnight. Bradycardia has been a longstanding issue. He has been asymptomatic with this. Low HRs were picked up while he resided in [place]. He declined PPM placement at that time. HRs have only recently become lower in the mornings (20-30s).   EKG: sinus bradycardia, borderline 1st degree AVB, 42 bpm, RBBB, LAFB, LAD, no ST/T changes  Wt  Readings from Last 3 Encounters:  09/07/12 235 lb 12 oz (106.935 kg)  08/28/12 238 lb 4 oz (108.069 kg)  06/05/12 235 lb (106.595 kg)     Past Medical History  Diagnosis Date  . Chronic prostatitis   . Obstructive sleep apnea     CPAP-9  . Chronic ear infection   . Asthma   . GERD (gastroesophageal reflux disease)     laryngeal involvement  . Hypertension   . Osteoarthrosis, localized, primary, knee     post-traumatic  . Diabetes mellitus   . Tachy-brady syndrome   . Atrial flutter 2007  . Benign prostatic hypertrophy   . Hyperlipidemia     Current Outpatient Prescriptions  Medication Sig Dispense Refill  . albuterol (VENTOLIN HFA) 108 (90 BASE) MCG/ACT inhaler Inhale 2 puffs into the lungs every 6 (six) hours as needed.  8.5 g  11  . amLODipine (NORVASC) 5 MG tablet Take 1 tablet (5 mg total) by mouth daily.  90 tablet  3  . aspirin 81 MG tablet Take 81 mg by mouth daily.        . Calcium Carbonate-Vitamin D (CALCIUM-CARB 600 + D) 600-125 MG-UNIT TABS Take by mouth.        . docusate sodium (COLACE) 100 MG capsule Take 100 mg by mouth 2 (two) times daily.       . famotidine (PEPCID) 20 MG tablet Take 20 mg by mouth 1 dose over 24 hours.       Marland Kitchen  finasteride (PROSCAR) 5 MG tablet TAKE ONE TABLET BY MOUTH ONCE DAILY  90 tablet  0  . fish oil-omega-3 fatty acids 1000 MG capsule Take 1 g by mouth daily.       . Glucosamine Sulfate-MSM (GLUCOSAMINE-MSM DS) 500-500 MG TABS Take 1,500 mg/day by mouth daily.      Marland Kitchen glucose blood test strip Use as instructed to test blood sugar once daily dx: 250.00      . loratadine (CLARITIN) 10 MG tablet Take 10 mg by mouth daily.        Marland Kitchen MICROLET LANCETS MISC Use to test twice daily or as directed  100 each  3  . Multiple Vitamins-Minerals (CENTRUM SILVER PO) Take by mouth daily after breakfast.        . nitroGLYCERIN (NITROSTAT) 0.4 MG SL tablet Place 0.4 mg under the tongue every 5 (five) minutes as needed for chest pain.      Marland Kitchen  triamterene-hydrochlorothiazide (MAXZIDE) 75-50 MG per tablet Take 1 tablet by mouth daily.  90 tablet  3  . warfarin (COUMADIN) 5 MG tablet TAKE 1 & 1/2 TABLETS DAILY  135 tablet  3   No current facility-administered medications for this visit.    Allergies:    Allergies  Allergen Reactions  . Enoxaparin Sodium Hives  . Latex Rash  . Percocet [Oxycodone-Acetaminophen] Rash    Social History:  The patient  reports that he quit smoking about 43 years ago. His smoking use included Cigarettes. He smoked 1.00 pack per day for 0 years. He has never used smokeless tobacco. He reports that he drinks about 0.6 ounces of alcohol per week. He reports that he does not use illicit drugs.   Family History:  Family History  Problem Relation Age of Onset  . Other Mother     natural causes  . Colon cancer Father   . Diabetes Father     Review of Systems: General: positive for fatigue, somnolence, negative for chills, fever, night sweats or weight changes.  Cardiovascular: positive for DOE, LE edema, negative for chest pain, orthopnea, palpitations, paroxysmal nocturnal dyspnea or shortness of breath Dermatological: negative for rash Respiratory: negative for cough or wheezing Urologic: negative for hematuria Abdominal: negative for nausea, vomiting, diarrhea, bright red blood per rectum, melena, or hematemesis Neurologic: negative for visual changes, syncope, or dizziness All other systems reviewed and are otherwise negative except as noted above.  PHYSICAL EXAM: VS:  BP 124/70  Pulse 42  Ht 5\' 11"  (1.803 m)  Wt 235 lb 12 oz (106.935 kg)  BMI 32.89 kg/m2 Well nourished, well developed, in no acute distress HEENT: normal, PERRL Neck: no JVD or bruits Cardiac:  normal S1, S2, bradycardic, regular rhythm; no murmur or gallops Lungs:  clear to auscultation bilaterally, no wheezing, rhonchi or rales Abd: soft, nontender, no hepatomegaly, normoactive BS x 4 quads Ext: 1-2+ bilateral  pretibial pitting edema, no cyanosis or clubbing Skin: warm and dry, cap refill < 2 sec Neuro:  CNs 2-12 intact, no focal abnormalities noted Musculoskeletal: strength and tone appropriate for age  Psych: normal affect

## 2012-09-08 LAB — BASIC METABOLIC PANEL
BUN/Creatinine Ratio: 29 — ABNORMAL HIGH (ref 10–22)
BUN: 24 mg/dL (ref 8–27)
CO2: 24 mmol/L (ref 18–29)
Calcium: 10.3 mg/dL — ABNORMAL HIGH (ref 8.6–10.2)
Chloride: 99 mmol/L (ref 97–108)
Creatinine, Ser: 0.84 mg/dL (ref 0.76–1.27)
GFR calc Af Amer: 96 mL/min/{1.73_m2} (ref >59)
GFR calc non Af Amer: 83 mL/min/{1.73_m2} (ref >59)
Glucose: 148 mg/dL — ABNORMAL HIGH (ref 65–99)
Potassium: 4.3 mmol/L (ref 3.5–5.2)
Sodium: 138 mmol/L (ref 134–144)

## 2012-09-08 LAB — CBC
HCT: 44.2 % (ref 37.5–51.0)
Hemoglobin: 15.6 g/dL (ref 12.6–17.7)
MCH: 31.5 pg (ref 26.6–33.0)
MCHC: 35.3 g/dL (ref 31.5–35.7)
MCV: 89 fL (ref 79–97)
Platelets: 124 10*3/uL — ABNORMAL LOW (ref 150–379)
RBC: 4.95 x10E6/uL (ref 4.14–5.80)
RDW: 12.8 % (ref 12.3–15.4)
WBC: 6.8 10*3/uL (ref 3.4–10.8)

## 2012-09-08 LAB — TSH: TSH: 0.714 u[IU]/mL (ref 0.450–4.500)

## 2012-09-08 LAB — MAGNESIUM: Magnesium: 1.6 mg/dL (ref 1.6–2.6)

## 2012-09-20 DIAGNOSIS — Z23 Encounter for immunization: Secondary | ICD-10-CM | POA: Diagnosis not present

## 2012-09-24 ENCOUNTER — Ambulatory Visit (INDEPENDENT_AMBULATORY_CARE_PROVIDER_SITE_OTHER): Payer: Medicare Other

## 2012-09-24 DIAGNOSIS — I498 Other specified cardiac arrhythmias: Secondary | ICD-10-CM

## 2012-09-24 DIAGNOSIS — R001 Bradycardia, unspecified: Secondary | ICD-10-CM

## 2012-09-26 ENCOUNTER — Telehealth: Payer: Self-pay

## 2012-09-26 NOTE — Telephone Encounter (Signed)
Spoke with wife and she will wait and see if medicare will cover, she will check

## 2012-09-26 NOTE — Telephone Encounter (Signed)
V/M left; pt received pneumococcal polysaccharide 09/04/2003; should pt get the pneumovax 23 at [facility].Please advise.

## 2012-09-26 NOTE — Telephone Encounter (Signed)
It is actually the prevnar or 13 vaccine  Yes, this is now recommended and he can get it at the pharmacy if they give it

## 2012-10-05 ENCOUNTER — Ambulatory Visit (INDEPENDENT_AMBULATORY_CARE_PROVIDER_SITE_OTHER): Payer: Medicare Other | Admitting: Internal Medicine

## 2012-10-05 ENCOUNTER — Encounter: Payer: Self-pay | Admitting: Internal Medicine

## 2012-10-05 VITALS — BP 110/60 | HR 50 | Temp 97.9°F | Wt 235.0 lb

## 2012-10-05 DIAGNOSIS — R609 Edema, unspecified: Secondary | ICD-10-CM

## 2012-10-05 DIAGNOSIS — R600 Localized edema: Secondary | ICD-10-CM

## 2012-10-05 MED ORDER — CEPHALEXIN 500 MG PO TABS: 500.0000 mg | ORAL_TABLET | Freq: Three times a day (TID) | ORAL | Status: DC

## 2012-10-05 NOTE — Assessment & Plan Note (Signed)
He has had recurrent cellulitis but I don't see evidence of this now Redness consistent with mild stasis changes  Will have him stop his amlodipine and see if that helps the edema BP has been running low and possible that could be affecting his AV node some  Antibiotic Rx given in case it worsens

## 2012-10-05 NOTE — Patient Instructions (Signed)
Please stop your amlodipine--this may improve your leg swelling. Please start the cephalexin antibiotic if your left leg redness gets worse

## 2012-10-05 NOTE — Progress Notes (Signed)
Subjective:    Patient ID: Michael Colon, male    DOB: 25-May-1930, 79 y.o.   MRN: [id number]  HPI Thinks he has cellulitis in legs Has long history of left leg cellulitis that goes back to 1997 (after surgery for leg fracture) No recurrence since July 2010  Increased tenderness and heat noted yesterday  Thinks increased swelling for 4-5 days Normal temp is low---thinks it may be up some No sweats or chills  Current Outpatient Prescriptions on File Prior to Visit  Medication Sig Dispense Refill  . albuterol (VENTOLIN HFA) 108 (90 BASE) MCG/ACT inhaler Inhale 2 puffs into the lungs every 6 (six) hours as needed.  8.5 g  11  . amLODipine (NORVASC) 5 MG tablet Take 1 tablet (5 mg total) by mouth daily.  90 tablet  3  . aspirin 81 MG tablet Take 81 mg by mouth daily.        . Calcium Carbonate-Vitamin D (CALCIUM-CARB 600 + D) 600-125 MG-UNIT TABS Take by mouth.        . docusate sodium (COLACE) 100 MG capsule Take 100 mg by mouth 2 (two) times daily.       . famotidine (PEPCID) 20 MG tablet Take 20 mg by mouth 1 dose over 24 hours.       . finasteride (PROSCAR) 5 MG tablet TAKE ONE TABLET BY MOUTH ONCE DAILY  90 tablet  0  . fish oil-omega-3 fatty acids 1000 MG capsule Take 1 g by mouth daily.       . Glucosamine Sulfate-MSM (GLUCOSAMINE-MSM DS) 500-500 MG TABS Take 1,500 mg/day by mouth daily.      Marland Kitchen glucose blood test strip Use as instructed to test blood sugar once daily dx: 250.00      . loratadine (CLARITIN) 10 MG tablet Take 10 mg by mouth daily.        Marland Kitchen MICROLET LANCETS MISC Use to test twice daily or as directed  100 each  3  . Multiple Vitamins-Minerals (CENTRUM SILVER PO) Take by mouth daily after breakfast.        . nitroGLYCERIN (NITROSTAT) 0.4 MG SL tablet Place 0.4 mg under the tongue every 5 (five) minutes as needed for chest pain.      Marland Kitchen triamterene-hydrochlorothiazide (MAXZIDE) 75-50 MG per tablet Take 1 tablet by mouth daily.  90 tablet  3  . warfarin (COUMADIN) 5  MG tablet TAKE 1 & 1/2 TABLETS DAILY  135 tablet  3   No current facility-administered medications on file prior to visit.    Allergies  Allergen Reactions  . Enoxaparin Sodium Hives  . Latex Rash  . Percocet [Oxycodone-Acetaminophen] Rash    Past Medical History  Diagnosis Date  . Chronic prostatitis   . Obstructive sleep apnea     CPAP-9  . Chronic ear infection   . Asthma   . GERD (gastroesophageal reflux disease)     laryngeal involvement  . Hypertension   . Osteoarthrosis, localized, primary, knee     post-traumatic  . Diabetes mellitus   . Tachy-brady syndrome   . Atrial flutter 2007  . Benign prostatic hypertrophy   . Hyperlipidemia     Past Surgical History  Procedure Laterality Date  . Mastoidectomy  8/08    Dr [name]  . Rhinoplasty  5/10    and septoplasty  . Cataract extraction, bilateral  2009  . Belpharoptosis repair      Dr [name]---didn't resolve weepy eye and eyelid drooping  . Knee surgery  1998    plate after fracture, then removed for infection  . Cardioversion  04/13/2010  . Chest pain  8/12    Stress test benign  . Appendectomy    . Left elbow surgery    . Venous ablation    . Tear duct probing  11/13    Dr [name]  . Subacromial decompression Right 2005    Arthroscopic (for rotator cuff and biceps tendon ruptures)    Family History  Problem Relation Age of Onset  . Other Mother     natural causes  . Colon cancer Father   . Diabetes Father     History   Social History  . Marital Status: Married    Spouse Name: N/A    Number of Children: 2  . 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 Smoking status: Former Smoker -- 1.00 packs/day for 0 years    Types: Cigarettes    Quit date: 01/03/1969  . Smokeless tobacco: Never Used  . Alcohol Use: 0.6 oz/week    1 Glasses of wine per week     Comment: rare wine  . Drug Use: No  . Sexual Activity: Not on  file   Other Topics Concern  . Not on file   Social History Narrative   Has living will   Health care POA-- wife and then son Onalee Hua   Has DNR order from the past---form redone 06/25/10   Not sure about tube feedings   Review of Systems No nausea or vomiting No trauma to leg Appetite is good    Objective:   Physical Exam  Constitutional: He appears well-developed and well-nourished. No distress.  Musculoskeletal: He exhibits edema.  1+ edema without pitting on left Trace on right  Scar on left  Mild redness in lower calf without sig warmth or tenderness Homan's negative          Assessment & Plan:

## 2012-10-08 ENCOUNTER — Ambulatory Visit (INDEPENDENT_AMBULATORY_CARE_PROVIDER_SITE_OTHER): Payer: Medicare Other | Admitting: Cardiovascular Disease

## 2012-10-08 ENCOUNTER — Encounter: Payer: Self-pay | Admitting: Cardiovascular Disease

## 2012-10-08 VITALS — BP 108/82 | HR 39 | Ht 71.0 in | Wt 235.0 lb

## 2012-10-08 DIAGNOSIS — I4892 Unspecified atrial flutter: Secondary | ICD-10-CM | POA: Diagnosis not present

## 2012-10-08 DIAGNOSIS — E785 Hyperlipidemia, unspecified: Secondary | ICD-10-CM | POA: Diagnosis not present

## 2012-10-08 DIAGNOSIS — I10 Essential (primary) hypertension: Secondary | ICD-10-CM

## 2012-10-08 DIAGNOSIS — R001 Bradycardia, unspecified: Secondary | ICD-10-CM

## 2012-10-08 DIAGNOSIS — I498 Other specified cardiac arrhythmias: Secondary | ICD-10-CM

## 2012-10-08 DIAGNOSIS — R600 Localized edema: Secondary | ICD-10-CM

## 2012-10-08 DIAGNOSIS — R609 Edema, unspecified: Secondary | ICD-10-CM

## 2012-10-08 MED ORDER — TRIAMTERENE-HCTZ 37.5-25 MG PO CAPS: 1.0000 | ORAL_CAPSULE | ORAL | Status: DC

## 2012-10-08 MED ORDER — TRIAMTERENE-HCTZ 75-50 MG PO TABS: 1.0000 | ORAL_TABLET | Freq: Every day | ORAL | Status: DC

## 2012-10-08 NOTE — Patient Instructions (Addendum)
You are doing well.  Please cut the triamtere HCTZ in 1/2  We will set up an appt with Dr. Graciela Husbands, EP  Please call us if you have new issues that need to be addressed before your next appt.  Your physician wants you to follow-up in: 6 months.  You will receive a reminder letter in the mail two months in advance. If you don't receive a letter, please call our office to schedule the follow-up appointment.

## 2012-10-08 NOTE — Assessment & Plan Note (Signed)
EKG and monitor appears to show no sinus rhythm since 08/24/2012.

## 2012-10-08 NOTE — Progress Notes (Signed)
Patient ID: Michael Colon, male    DOB: [birth year], 79 y.o.   MRN: [id number]  HPI Comments: Mr. Michael Colon is a very pleasant 79 year old gentleman with  history of atrial flutter, previous ablation , on warfarin, hypertension, borderline diabetes, Obstructive sleep apnea who presented to [facility] on August 04, 2010 with chest pain. Symptoms started after eating and it was felt he had GI spasm or hiatal hernia. Mild improvement in symptoms with nitroglycerin and GI cocktail in the emergency room. He is very active at baseline. Noted to be in atrial flutter March 2014.  In followup, he was seen in our clinic for malaise, fatigue," slowing down"per his wife. EKG showed sinus bradycardia (he had converted out of atrial flutter) Today Holter monitor showed normal sinus rhythm with pauses up to 2.86 seconds, bradycardia with heart rates in the 20s to 30s at nighttime, 30s to 50 during the daytime.   In followup today, his wife reports that he continues to have fatigue, significant slowing. He tries to stay very active. Denies any near syncope or syncope. He reports having a difficult time with choosing his words which is new for him in the past month or so. The wife provides a list of blood pressures and heart rates today over the past 2 months. Heart rate was typically in the 50-70 range until August 22 at which time his heart rate dropped to the low 30s. He has been consistently in the 30-40 range since that time. Presumably he converted to normal sinus rhythm at that time. Blood pressure has also been running low with frequent measurements showing systolic pressures in the 90s, up to 110. Amlodipine was recently stopped. Blood pressure has continued to run low 100, 90 systolic.   Hemoglobin A1c 7.5. Last total cholesterol 132, LDL 64. EKG today shows sinus bradycardia with rate 39 beats per minute, right bundle branch block   Outpatient Encounter Prescriptions as of 10/08/2012  Medication Sig Dispense  Refill  . albuterol (VENTOLIN HFA) 108 (90 BASE) MCG/ACT inhaler Inhale 2 puffs into the lungs every 6 (six) hours as needed.  8.5 g  11  . aspirin 81 MG tablet Take 81 mg by mouth daily.        . Calcium Carbonate-Vitamin D (CALCIUM-CARB 600 + D) 600-125 MG-UNIT TABS Take by mouth.        . Cephalexin 500 MG tablet Take 1 tablet (500 mg total) by mouth 3 (three) times daily.  30 tablet  0  . docusate sodium (COLACE) 100 MG capsule Take 100 mg by mouth 2 (two) times daily.       . famotidine (PEPCID) 20 MG tablet Take 20 mg by mouth 1 dose over 24 hours.       . finasteride (PROSCAR) 5 MG tablet TAKE ONE TABLET BY MOUTH ONCE DAILY  90 tablet  0  . fish oil-omega-3 fatty acids 1000 MG capsule Take 1 g by mouth daily.       . Glucosamine Sulfate-MSM (GLUCOSAMINE-MSM DS) 500-500 MG TABS Take 1,500 mg/day by mouth daily.      Marland Kitchen glucose blood test strip Use as instructed to test blood sugar once daily dx: 250.00      . loratadine (CLARITIN) 10 MG tablet Take 10 mg by mouth daily.        Marland Kitchen MICROLET LANCETS MISC Use to test twice daily or as directed  100 each  3  . Multiple Vitamins-Minerals (CENTRUM SILVER PO) Take by mouth daily after breakfast.        .  nitroGLYCERIN (NITROSTAT) 0.4 MG SL tablet Place 0.4 mg under the tongue every 5 (five) minutes as needed for chest pain.      Marland Kitchen triamterene-hydrochlorothiazide (MAXZIDE) 75-50 MG per tablet Take 1 tablet by mouth daily.  90 tablet  3  . warfarin (COUMADIN) 5 MG tablet TAKE 1 & 1/2 TABLETS DAILY  135 tablet  3   No facility-administered encounter medications on file as of 10/08/2012.    Review of Systems  Constitutional: Positive for fatigue.  HENT: Negative.   Eyes: Negative.   Respiratory: Negative.   Cardiovascular: Positive for leg swelling.  Gastrointestinal: Negative.   Musculoskeletal: Negative.   Skin: Negative.   Neurological: Positive for weakness.  Psychiatric/Behavioral: Negative.   All other systems reviewed and are  negative.    BP 108/82  Pulse 39  Ht 5\' 11"  (1.803 m)  Wt 235 lb (106.595 kg)  BMI 32.79 kg/m2  Physical Exam  Nursing note and vitals reviewed. Constitutional: He is oriented to person, place, and time. He appears well-developed and well-nourished.  HENT:  Head: Normocephalic.  Nose: Nose normal.  Mouth/Throat: Oropharynx is clear and moist.  Eyes: Conjunctivae are normal. Pupils are equal, round, and reactive to light.  Neck: Normal range of motion. Neck supple. No JVD present.  Cardiovascular: Normal rate, regular rhythm, S1 normal, S2 normal, normal heart sounds and intact distal pulses.  Exam reveals no gallop and no friction rub.   No murmur heard. Pulmonary/Chest: Effort normal and breath sounds normal. No respiratory distress. He has no wheezes. He has no rales. He exhibits no tenderness.  Abdominal: Soft. Bowel sounds are normal. He exhibits no distension. There is no tenderness.  Musculoskeletal: Normal range of motion. He exhibits edema. He exhibits no tenderness.  Lymphadenopathy:    He has no cervical adenopathy.  Neurological: He is alert and oriented to person, place, and time. Coordination normal.  Skin: Skin is warm and dry. No rash noted. No erythema.  Psychiatric: He has a normal mood and affect. His behavior is normal. Judgment and thought content normal.      Assessment and Plan

## 2012-10-08 NOTE — Assessment & Plan Note (Signed)
Symptoms started presumably when he converted from atrial flutter to normal sinus rhythm on 08/24/2012. Now in chronic sinus bradycardia. Recent Holter monitor showing pauses up to 2.86 seconds, long stretches of bradycardia with rates in the 20s to 30s. He has weakness, fatigue, and self-reported difficulty with word choices. We will refer him to Dr. Graciela Husbands, EP, for discussion whether he needs a pacemaker.

## 2012-10-08 NOTE — Assessment & Plan Note (Signed)
Blood pressure continues to run low despite holding amlodipine. We have suggested he cut his triamterene HCTZ in half and continue to monitor his blood pressure

## 2012-10-08 NOTE — Assessment & Plan Note (Signed)
Cholesterol is at goal on the current lipid regimen. No changes to the medications were made.  

## 2012-10-08 NOTE — Assessment & Plan Note (Signed)
Edema likely from venous insufficiency on the left, no significant edema on the right. Previous trauma and surgery on the left

## 2012-10-10 ENCOUNTER — Ambulatory Visit (INDEPENDENT_AMBULATORY_CARE_PROVIDER_SITE_OTHER): Payer: Medicare Other | Admitting: General Practice

## 2012-10-10 DIAGNOSIS — Z7901 Long term (current) use of anticoagulants: Secondary | ICD-10-CM | POA: Diagnosis not present

## 2012-10-10 DIAGNOSIS — I4892 Unspecified atrial flutter: Secondary | ICD-10-CM

## 2012-10-10 LAB — POCT INR: INR: 2.2

## 2012-10-16 ENCOUNTER — Encounter: Payer: Self-pay | Admitting: Internal Medicine

## 2012-10-16 ENCOUNTER — Ambulatory Visit (INDEPENDENT_AMBULATORY_CARE_PROVIDER_SITE_OTHER): Payer: Medicare Other | Admitting: Internal Medicine

## 2012-10-16 VITALS — BP 112/76 | HR 70 | Ht 71.0 in | Wt 235.5 lb

## 2012-10-16 DIAGNOSIS — I4892 Unspecified atrial flutter: Secondary | ICD-10-CM

## 2012-10-16 DIAGNOSIS — I498 Other specified cardiac arrhythmias: Secondary | ICD-10-CM | POA: Diagnosis not present

## 2012-10-16 DIAGNOSIS — I495 Sick sinus syndrome: Secondary | ICD-10-CM | POA: Diagnosis not present

## 2012-10-16 DIAGNOSIS — I472 Ventricular tachycardia, unspecified: Secondary | ICD-10-CM

## 2012-10-16 DIAGNOSIS — R001 Bradycardia, unspecified: Secondary | ICD-10-CM

## 2012-10-16 HISTORY — DX: Ventricular tachycardia, unspecified: I47.20

## 2012-10-16 NOTE — Patient Instructions (Signed)
Your physician has recommended you make the following change in your medication:  1) Stop aspirin.  We will see you back on an as needed basis.

## 2012-10-16 NOTE — Assessment & Plan Note (Signed)
He has had pauses dependent polymorphic ventricular tachycardia. tHis highlights the importance of treating his bradycardia.Michael Colon  He will continue to keep a record on a daily basis of his heart rates as noted above

## 2012-10-16 NOTE — Assessment & Plan Note (Signed)
Anticoagulated. We will discontinue his aspirin and will continue him on warfarin. We discussed a NOAC therapy; he is concerned about cost

## 2012-10-16 NOTE — Assessment & Plan Note (Signed)
He h e has significant bradycardia and interestingly,with the discontinuation of his amlodipine, there has been a marked interval increase from the 30s-40s to the 60s-70s and concomitant with that improvement in his exercise intolerance. This is well described with amlodipine although it is relatively uncommon.  In the event that he has recurrent sinus node dysfunction as I would anticipate we will plan to proceed with pacing  At that juncture there would be no contributing medications. We have reviewed the pacemaker procedure today.

## 2012-10-16 NOTE — Progress Notes (Signed)
ELECTROPHYSIOLOGY CONSULT NOTE  Patient ID: Michael Colon, MRN: [id number], DOB/AGE: 06-26-1930 79 y.o. Admit date: (Not on file) Date of Consult: 10/16/2012  Primary Physician: Tillman Abide, MD Primary Cardiologist TG  Chief Complaint: slow HR   HPI Michael Colon is a 79 y.o. male  Seen for sinus bradycardia that is symptomatic with heart rates on Holter monitoring of 20s-30s. This has been associated with weakness and fatigue.the maximum heart rate was 79 beats per minute. He is also noted to have right bundle branch left axis deviation. Last week his resting heart rate was 39. Serendipitously, the week before, Dr. Alphonsus Sias had stopped his amlodipine because of lower extremity cellulitis edema. His heart rate is much improved since then and with this he has noted a marked decrease in his exercise tolerance. He is also had much less dizziness.  He has a history of atrial flutter for which he underwent catheter ablation at [facility]. He has had recurrent arrhythmias. Hence, he is on anticoagulation.  The Holter monitor noted above further review it also demonstrated bradycardia dependent nonsustained ventricular tachycardia   Echocardiogram 2011 demonstrated normal left ventricular function mild left atrial enlargement and mild mitral valve prolapse Myoview 2013 demonstrated normal left ventricular function and normal perfusion   Past Medical History  Diagnosis Date  . Chronic prostatitis   . Obstructive sleep apnea     CPAP-9  . Chronic ear infection   . Asthma   . GERD (gastroesophageal reflux disease)     laryngeal involvement  . Hypertension   . Osteoarthrosis, localized, primary, knee     post-traumatic  . Diabetes mellitus   . Tachy-brady syndrome   . Atrial flutter 2007  . Benign prostatic hypertrophy   . Hyperlipidemia       Surgical History:  Past Surgical History  Procedure Laterality Date  . Mastoidectomy  8/08    Dr [name]  . Rhinoplasty   5/10    and septoplasty  . Cataract extraction, bilateral  2009  . Belpharoptosis repair      Dr [name]---didn't resolve weepy eye and eyelid drooping  . Knee surgery  1998    plate after fracture, then removed for infection  . Cardioversion  04/13/2010  . Chest pain  8/12    Stress test benign  . Appendectomy    . Left elbow surgery    . Venous ablation    . Tear duct probing  11/13    Dr [name]  . Subacromial decompression Right 2005    Arthroscopic (for rotator cuff and biceps tendon ruptures)     Home Meds: Prior to Admission medications   Medication Sig Start Date End Date Taking? Authorizing Provider  albuterol (VENTOLIN HFA) 108 (90 BASE) MCG/ACT inhaler Inhale 2 puffs into the lungs every 6 (six) hours as needed. 04/12/11  Yes Karie Schwalbe, MD  aspirin 81 MG tablet Take 81 mg by mouth daily.     Yes Historical Provider, MD  Calcium Carbonate-Vitamin D (CALCIUM-CARB 600 + D) 600-125 MG-UNIT TABS Take by mouth.     Yes Historical Provider, MD  Cephalexin 500 MG tablet Take 1 tablet (500 mg total) by mouth 3 (three) times daily. 10/05/12  Yes Karie Schwalbe, MD  docusate sodium (COLACE) 100 MG capsule Take 100 mg by mouth 2 (two) times daily.    Yes Historical Provider, MD  famotidine (PEPCID) 20 MG tablet Take 20 mg by mouth 1 dose over 24 hours.    Yes Historical Provider,  MD  finasteride (PROSCAR) 5 MG tablet TAKE ONE TABLET BY MOUTH ONCE DAILY 08/10/12  Yes Karie Schwalbe, MD  fish oil-omega-3 fatty acids 1000 MG capsule Take 1 g by mouth daily.    Yes Historical Provider, MD  Glucosamine Sulfate-MSM (GLUCOSAMINE-MSM DS) 500-500 MG TABS Take 1,500 mg/day by mouth daily.   Yes Historical Provider, MD  glucose blood test strip Use as instructed to test blood sugar once daily dx: 250.00 05/17/12  Yes Karie Schwalbe, MD  loratadine (CLARITIN) 10 MG tablet Take 10 mg by mouth daily.     Yes Historical Provider, MD  MICROLET LANCETS MISC Use to test twice daily or as directed  09/06/11  Yes Karie Schwalbe, MD  Multiple Vitamins-Minerals (CENTRUM SILVER PO) Take by mouth daily after breakfast.     Yes Historical Provider, MD  nitroGLYCERIN (NITROSTAT) 0.4 MG SL tablet Place 0.4 mg under the tongue every 5 (five) minutes as needed for chest pain.   Yes Antonieta Iba, MD  triamterene-hydrochlorothiazide (DYAZIDE) 37.5-25 MG per capsule Take 1 each (1 capsule total) by mouth every morning. 10/08/12  Yes Antonieta Iba, MD  warfarin (COUMADIN) 5 MG tablet TAKE 1 & 1/2 TABLETS DAILY 08/16/11  Yes Karie Schwalbe, MD      Allergies:  Allergies  Allergen Reactions  . Enoxaparin Sodium Hives  . Latex Rash  . Percocet [Oxycodone-Acetaminophen] Rash    History   Social History  . Marital Status: Married    Spouse Name: N/A    Number of Children: 2  . 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 Smoking status: Former Smoker -- 1.00 packs/day for 0 years    Types: Cigarettes    Quit date: 01/03/1969  . Smokeless tobacco: Never Used  . Alcohol Use: No     Comment: rare wine  . Drug Use: No  . Sexual Activity: Not on file   Other Topics Concern  . Not on file   Social History Narrative   Has living will   Health care POA-- wife and then son Onalee Hua   Has DNR order from the past---form redone 06/25/10   Not sure about tube feedings     Family History  Problem Relation Age of Onset  . Other Mother     natural causes  . Colon cancer Father   . Diabetes Father      ROS:  Please see the history of present illness.     All other systems reviewed and negative.    Physical Exam:   Blood pressure 112/76, pulse 70, height 5\' 11"  (1.803 m), weight 235 lb 8 oz (106.822 kg). General: Well developed, obese caucasion  male in no acute distress. Head: Normocephalic, atraumatic, sclera non-icteric, no xanthomas, nares are without discharge. EENT: normal Lymph Nodes:  none Back: without  scoliosis/kyphosis , no CVA tendersness Neck: Negative for carotid bruits. JVD not elevated. Lungs: Clear bilaterally to auscultation without wheezes, rales, or rhonchi. Breathing is unlabored. Heart: RRR with S1 S2. 2/6 systolic murmur  NO, rubs, or gallops appreciated. Abdomen: Soft, non-tender, non-distended with normoactive bowel sounds. No hepatomegaly. No rebound/guarding. No obvious abdominal masses. Msk:  Strength and tone appear normal for age. Extremities: No clubbing or cyanosis. tr edema.  Distal pedal pulses are 2+ and equal bilaterally. Skin: Warm and Dry Neuro: Alert and oriented X 3. CN III-XII intact Grossly normal sensory  and motor function . Psych:  Responds to questions appropriately with a normal affect.      Labs: Cardiac Enzymes No results found for this basename: CKTOTAL, CKMB, TROPONINI,  in the last 72 hours CBC Lab Results  Component Value Date   WBC 6.8 09/07/2012   HGB 15.6 09/07/2012   HCT 44.2 09/07/2012   MCV 89 09/07/2012   PLT 124* 09/07/2012   PROTIME: No results found for this basename: LABPROT, INR,  in the last 72 hours Chemistry No results found for this basename: NA, K, CL, CO2, BUN, CREATININE, CALCIUM, LABALBU, PROT, BILITOT, ALKPHOS, ALT, AST, GLUCOSE,  in the last 168 hours Lipids Lab Results  Component Value Date   CHOL 132 08/04/2011   HDL 58 08/04/2011   LDLCALC 64 08/04/2011   TRIG 83.0 04/12/2011   BNP No results found for this basename: probnp   Miscellaneous No results found for this basename: DDIMER    Radiology/Studies:  No results found.  EKG: sinus @ 67  RBBB  Assessment and Plan:   Sherryl Manges

## 2012-10-18 ENCOUNTER — Other Ambulatory Visit: Payer: Self-pay | Admitting: Internal Medicine

## 2012-10-18 NOTE — Telephone Encounter (Signed)
Not sure how to fill this will send to cardiology, he doesn't get his checks here

## 2012-10-18 NOTE — Telephone Encounter (Signed)
Okay to refill for a year Please confirm current dose first (from last clinic note)

## 2012-10-30 DIAGNOSIS — G909 Disorder of the autonomic nervous system, unspecified: Secondary | ICD-10-CM | POA: Diagnosis not present

## 2012-10-30 DIAGNOSIS — L851 Acquired keratosis [keratoderma] palmaris et plantaris: Secondary | ICD-10-CM | POA: Diagnosis not present

## 2012-10-30 DIAGNOSIS — M659 Synovitis and tenosynovitis, unspecified: Secondary | ICD-10-CM | POA: Diagnosis not present

## 2012-10-30 DIAGNOSIS — E1149 Type 2 diabetes mellitus with other diabetic neurological complication: Secondary | ICD-10-CM | POA: Diagnosis not present

## 2012-10-30 DIAGNOSIS — IMO0001 Reserved for inherently not codable concepts without codable children: Secondary | ICD-10-CM | POA: Diagnosis not present

## 2012-11-14 ENCOUNTER — Other Ambulatory Visit: Payer: Self-pay | Admitting: Internal Medicine

## 2012-11-21 ENCOUNTER — Ambulatory Visit (INDEPENDENT_AMBULATORY_CARE_PROVIDER_SITE_OTHER): Payer: Medicare Other | Admitting: General Practice

## 2012-11-21 DIAGNOSIS — I4892 Unspecified atrial flutter: Secondary | ICD-10-CM

## 2012-11-21 DIAGNOSIS — Z7901 Long term (current) use of anticoagulants: Secondary | ICD-10-CM | POA: Diagnosis not present

## 2012-11-21 LAB — POCT INR: INR: 2.4

## 2012-12-05 ENCOUNTER — Emergency Department: Payer: Self-pay | Admitting: Emergency Medicine

## 2012-12-05 ENCOUNTER — Telehealth: Payer: Self-pay | Admitting: Internal Medicine

## 2012-12-05 DIAGNOSIS — R5381 Other malaise: Secondary | ICD-10-CM | POA: Diagnosis not present

## 2012-12-05 DIAGNOSIS — R4182 Altered mental status, unspecified: Secondary | ICD-10-CM | POA: Diagnosis not present

## 2012-12-05 DIAGNOSIS — R9431 Abnormal electrocardiogram [ECG] [EKG]: Secondary | ICD-10-CM | POA: Diagnosis not present

## 2012-12-05 DIAGNOSIS — Z7901 Long term (current) use of anticoagulants: Secondary | ICD-10-CM | POA: Diagnosis not present

## 2012-12-05 DIAGNOSIS — N390 Urinary tract infection, site not specified: Secondary | ICD-10-CM | POA: Diagnosis not present

## 2012-12-05 DIAGNOSIS — F29 Unspecified psychosis not due to a substance or known physiological condition: Secondary | ICD-10-CM | POA: Diagnosis not present

## 2012-12-05 DIAGNOSIS — E119 Type 2 diabetes mellitus without complications: Secondary | ICD-10-CM | POA: Diagnosis not present

## 2012-12-05 LAB — COMPREHENSIVE METABOLIC PANEL
Albumin: 3.6 g/dL (ref 3.4–5.0)
Alkaline Phosphatase: 70 U/L
Anion Gap: 7 (ref 7–16)
BUN: 21 mg/dL — ABNORMAL HIGH (ref 7–18)
Bilirubin,Total: 0.6 mg/dL (ref 0.2–1.0)
Calcium, Total: 10.5 mg/dL — ABNORMAL HIGH (ref 8.5–10.1)
Chloride: 100 mmol/L (ref 98–107)
Co2: 30 mmol/L (ref 21–32)
Creatinine: 0.76 mg/dL (ref 0.60–1.30)
EGFR (African American): >60
EGFR (Non-African Amer.): >60
Glucose: 170 mg/dL — ABNORMAL HIGH (ref 65–99)
Osmolality: 281 (ref 275–301)
Potassium: 3.9 mmol/L (ref 3.5–5.1)
SGOT(AST): 13 U/L — ABNORMAL LOW (ref 15–37)
SGPT (ALT): 22 U/L (ref 12–78)
Sodium: 137 mmol/L (ref 136–145)
Total Protein: 7 g/dL (ref 6.4–8.2)

## 2012-12-05 LAB — URINALYSIS, COMPLETE
Bacteria: NONE SEEN
Bilirubin,UR: NEGATIVE
Blood: NEGATIVE
Glucose,UR: 50 mg/dL (ref 0–75)
Ketone: NEGATIVE
Nitrite: NEGATIVE
Ph: 7 (ref 4.5–8.0)
Protein: NEGATIVE
RBC,UR: 4 /HPF (ref 0–5)
Specific Gravity: 1.016 (ref 1.003–1.030)
Squamous Epithelial: <1
WBC UR: 29 /HPF (ref 0–5)

## 2012-12-05 LAB — PROTIME-INR
INR: 1.8
Prothrombin Time: 21 secs — ABNORMAL HIGH (ref 11.5–14.7)

## 2012-12-05 LAB — CBC
HCT: 50.2 % (ref 40.0–52.0)
HGB: 17.2 g/dL (ref 13.0–18.0)
MCH: 30.7 pg (ref 26.0–34.0)
MCHC: 34.2 g/dL (ref 32.0–36.0)
MCV: 90 fL (ref 80–100)
Platelet: 125 10*3/uL — ABNORMAL LOW (ref 150–440)
RBC: 5.6 10*6/uL (ref 4.40–5.90)
RDW: 13 % (ref 11.5–14.5)
WBC: 8.3 10*3/uL (ref 3.8–10.6)

## 2012-12-05 LAB — TROPONIN I: Troponin-I: <0.02 ng/mL

## 2012-12-05 NOTE — Telephone Encounter (Signed)
Please check on him tomorrow morning and see what happened

## 2012-12-05 NOTE — Telephone Encounter (Signed)
Patient Information:  Caller Name: Michael Colon  Phone: [phone]  Patient: Michael Colon, Michael Colon  Gender: Male  DOB: 1930/03/01  Age: 79 Years  PCP: Tillman Abide [facility])  Office Follow Up:  Does the office need to follow up with this patient?: No  Instructions For The Office: N/A  RN Note:  wife says she will carry him to [facility]  Symptoms  Reason For Call & Symptoms: says Michael Colon has been feeling all morning like he is going to fall; BP 145/94; confused; forgetting people's names; forgetting what he is doing on the computer; says he feels like he is in a fog  Reviewed Health History In EMR: N/A  Reviewed Medications In EMR: N/A  Reviewed Allergies In EMR: N/A  Reviewed Surgeries / Procedures: N/A  Date of Onset of Symptoms: 12/05/2012  Guideline(s) Used:  Confusion - Delirium  Disposition Per Guideline:   Call EMS 911 Now  Reason For Disposition Reached:   Difficult to awaken or acting confused (disoriented, slurred speech) and new onset  Advice Given:  N/A  Patient Will Follow Care Advice:  YES

## 2012-12-06 ENCOUNTER — Telehealth: Payer: Self-pay

## 2012-12-06 ENCOUNTER — Telehealth: Payer: Self-pay | Admitting: Internal Medicine

## 2012-12-06 NOTE — Telephone Encounter (Signed)
Pt wife called and states pt was seen in the ED, states he was put on Cipro. Wants to know if this will effect his coumadin. Please call

## 2012-12-06 NOTE — Telephone Encounter (Signed)
Please get the ER records from [facility]

## 2012-12-06 NOTE — Telephone Encounter (Signed)
Okay to wait till next week unless he worsens

## 2012-12-06 NOTE — Telephone Encounter (Signed)
I faxed a request for 12/05/12 ER records to [facility].

## 2012-12-06 NOTE — Telephone Encounter (Signed)
Michael Colon wants to know if someone will call [facility] to get the records from last night. They are going to wait until his 12/11/2012 visit to come in. Thank you.

## 2012-12-06 NOTE — Telephone Encounter (Signed)
Spoke with pt, states he was started on Cipro 500mg  BID x 7 days on 12/05/12.  Advised pt Cipro can increase the effects of Coumadin needs INR checked on Monday 12/10/12.  Appt made in [place] office, pt aware.

## 2012-12-06 NOTE — Telephone Encounter (Signed)
Pt's wife called and says pt was seen at [facility] ER last night.  Pt was confused and said he felt as if he was in a "fog". The dr at ER said pt had a UTI and treated him w/Cipro (per pt's wife) and told him to follow up w/his PCP in 1-2 days. The only thing you have avail are 2 "same day" apptmts for tomorrow and nothing for today.  Pt also has a CPE scheduled for Tuesday, Dec. 9,2014 and Mrs. Uselman wanted to know if you thought it would be ok to wait till then or if that would be too much for a CPE visit? If you let me know I will call the pt back. Thank you.

## 2012-12-07 LAB — URINE CULTURE

## 2012-12-07 NOTE — Telephone Encounter (Signed)
Attempted to call pt, no answer. 

## 2012-12-10 ENCOUNTER — Ambulatory Visit (INDEPENDENT_AMBULATORY_CARE_PROVIDER_SITE_OTHER): Payer: Medicare Other | Admitting: Pharmacist

## 2012-12-10 DIAGNOSIS — Z7901 Long term (current) use of anticoagulants: Secondary | ICD-10-CM | POA: Diagnosis not present

## 2012-12-10 DIAGNOSIS — I4892 Unspecified atrial flutter: Secondary | ICD-10-CM | POA: Diagnosis not present

## 2012-12-10 LAB — POCT INR: INR: 2.2

## 2012-12-10 NOTE — Telephone Encounter (Signed)
Spoke to patient's wife and was advised that he was diagnosed with a UTI and was given Cipro for 7 days . Was advised that patient is doing better and his confusion has cleared up. Patient has an appointment scheduled with Dr. Alphonsus Sias Wednesday.

## 2012-12-10 NOTE — Telephone Encounter (Signed)
Will reevaluate at upcoming appt

## 2012-12-11 ENCOUNTER — Ambulatory Visit (INDEPENDENT_AMBULATORY_CARE_PROVIDER_SITE_OTHER): Payer: Medicare Other | Admitting: Internal Medicine

## 2012-12-11 ENCOUNTER — Encounter: Payer: Self-pay | Admitting: Internal Medicine

## 2012-12-11 VITALS — BP 128/80 | HR 75 | Temp 97.1°F | Ht 71.0 in | Wt 237.0 lb

## 2012-12-11 DIAGNOSIS — G459 Transient cerebral ischemic attack, unspecified: Secondary | ICD-10-CM

## 2012-12-11 DIAGNOSIS — E785 Hyperlipidemia, unspecified: Secondary | ICD-10-CM | POA: Diagnosis not present

## 2012-12-11 DIAGNOSIS — Z0000 Encounter for general adult medical examination without abnormal findings: Secondary | ICD-10-CM | POA: Diagnosis not present

## 2012-12-11 DIAGNOSIS — I10 Essential (primary) hypertension: Secondary | ICD-10-CM

## 2012-12-11 DIAGNOSIS — E119 Type 2 diabetes mellitus without complications: Secondary | ICD-10-CM | POA: Diagnosis not present

## 2012-12-11 DIAGNOSIS — E669 Obesity, unspecified: Secondary | ICD-10-CM

## 2012-12-11 DIAGNOSIS — E1169 Type 2 diabetes mellitus with other specified complication: Secondary | ICD-10-CM

## 2012-12-11 DIAGNOSIS — I4892 Unspecified atrial flutter: Secondary | ICD-10-CM | POA: Diagnosis not present

## 2012-12-11 HISTORY — DX: Transient cerebral ischemic attack, unspecified: G45.9

## 2012-12-11 LAB — LIPID PANEL
Cholesterol: 200 mg/dL (ref 0–200)
HDL: 48.5 mg/dL (ref >39.00)
LDL Cholesterol: 124 mg/dL — ABNORMAL HIGH (ref 0–99)
Total CHOL/HDL Ratio: 4
Triglycerides: 139 mg/dL (ref 0.0–149.0)
VLDL: 27.8 mg/dL (ref 0.0–40.0)

## 2012-12-11 LAB — CBC WITH DIFFERENTIAL/PLATELET
Basophils Absolute: 0 10*3/uL (ref 0.0–0.1)
Basophils Relative: 0.4 % (ref 0.0–3.0)
Eosinophils Absolute: 0.4 10*3/uL (ref 0.0–0.7)
Eosinophils Relative: 5.3 % — ABNORMAL HIGH (ref 0.0–5.0)
HCT: 48.9 % (ref 39.0–52.0)
Hemoglobin: 16.7 g/dL (ref 13.0–17.0)
Lymphocytes Relative: 20.7 % (ref 12.0–46.0)
Lymphs Abs: 1.5 10*3/uL (ref 0.7–4.0)
MCHC: 34.3 g/dL (ref 30.0–36.0)
MCV: 90.3 fl (ref 78.0–100.0)
Monocytes Absolute: 0.9 10*3/uL (ref 0.1–1.0)
Monocytes Relative: 12.5 % — ABNORMAL HIGH (ref 3.0–12.0)
Neutro Abs: 4.3 10*3/uL (ref 1.4–7.7)
Neutrophils Relative %: 61.1 % (ref 43.0–77.0)
Platelets: 122 10*3/uL — ABNORMAL LOW (ref 150.0–400.0)
RBC: 5.41 Mil/uL (ref 4.22–5.81)
RDW: 13 % (ref 11.5–14.6)
WBC: 7 10*3/uL (ref 4.5–10.5)

## 2012-12-11 LAB — HEMOGLOBIN A1C: Hgb A1c MFr Bld: 7.5 % — ABNORMAL HIGH (ref 4.6–6.5)

## 2012-12-11 LAB — T4, FREE: Free T4: 0.97 ng/dL (ref 0.60–1.60)

## 2012-12-11 LAB — TSH: TSH: 0.76 u[IU]/mL (ref 0.35–5.50)

## 2012-12-11 NOTE — Assessment & Plan Note (Signed)
BP Readings from Last 3 Encounters:  12/11/12 128/80  10/16/12 112/76  10/08/12 108/82   Good control

## 2012-12-11 NOTE — Assessment & Plan Note (Addendum)
Discussed lifestyle Due for labs

## 2012-12-11 NOTE — Assessment & Plan Note (Signed)
No problems  Low without meds

## 2012-12-11 NOTE — Assessment & Plan Note (Signed)
Sees cardiology On coumadin

## 2012-12-11 NOTE — Assessment & Plan Note (Signed)
Had spell and seen in ER Diagnosed with UTI but had no prostate symptoms and culture negative Will just check carotids

## 2012-12-11 NOTE — Assessment & Plan Note (Signed)
I have personally reviewed the Medicare Annual Wellness questionnaire and have noted 1. The patient's medical and social history 2. Their use of alcohol, tobacco or illicit drugs 3. Their current medications and supplements 4. The patient's functional ability including ADL's, fall risks, home safety risks and hearing or visual             impairment. 5. Diet and physical activities 6. Evidence for depression or mood disorders  The patients weight, height, BMI and visual acuity have been recorded in the chart I have made referrals, counseling and provided education to the patient based review of the above and I have provided the pt with a written personalized care plan for preventive services.  I have provided you with a copy of your personalized plan for preventive services. Please take the time to review along with your updated medication list.  UTD on imms No cancer screening Discussed lifestyle

## 2012-12-11 NOTE — Progress Notes (Signed)
Pre-visit discussion using our clinic review tool. No additional management support is needed unless otherwise documented below in the visit note.  

## 2012-12-11 NOTE — Patient Instructions (Signed)

## 2012-12-11 NOTE — Progress Notes (Signed)
Subjective:    Patient ID: Michael Colon, male    DOB: 08-20-1930, 79 y.o.   MRN: [id number]  HPI Here for Medicare wellness and follow up No tobacco or alcohol Tries to walk regularly Reviewed other doctors he sees 1 fall in shower--no injury No depression or anhedonia Chronic hearing loss--can't use hearing aides Vision okay No cognitive changes  Had episode of "fog" with memory issues and stroke like symptoms Seen in ER on 12/3 Diagnosed with UTI and given cipro Urine C&S was negative though Told him to stop the cipro  Checks sugars every other day Usually under 120 Was high during his spell  Heart okay No palpitations No chest pain No SOB Exercise tolerance is good Still has mild edema on left---this is chronic No dizziness or syncope  Uses pepcid fairly regularly Holding while on cipro No heartburn or swallowing problems  Voids okay Stream is double No dribbling Nocturia 1-2 Mild urgency in day---has been drinking more water with possible UTI  Current Outpatient Prescriptions on File Prior to Visit  Medication Sig Dispense Refill  . albuterol (VENTOLIN HFA) 108 (90 BASE) MCG/ACT inhaler Inhale 2 puffs into the lungs every 6 (six) hours as needed.  8.5 g  11  . Calcium Carbonate-Vitamin D (CALCIUM-CARB 600 + D) 600-125 MG-UNIT TABS Take by mouth.        . Cephalexin 500 MG tablet Take 1 tablet (500 mg total) by mouth 3 (three) times daily.  30 tablet  0  . docusate sodium (COLACE) 100 MG capsule Take 100 mg by mouth 2 (two) times daily.       . famotidine (PEPCID) 20 MG tablet Take 20 mg by mouth 1 dose over 24 hours.       . finasteride (PROSCAR) 5 MG tablet TAKE ONE TABLET BY MOUTH ONCE DAILY  90 tablet  0  . fish oil-omega-3 fatty acids 1000 MG capsule Take 1 g by mouth daily.       . Glucosamine Sulfate-MSM (GLUCOSAMINE-MSM DS) 500-500 MG TABS Take 1,500 mg/day by mouth daily.      Marland Kitchen glucose blood test strip Use as instructed to test blood sugar  once daily dx: 250.00      . loratadine (CLARITIN) 10 MG tablet Take 10 mg by mouth daily.        Marland Kitchen MICROLET LANCETS MISC Use to test twice daily or as directed  100 each  3  . Multiple Vitamins-Minerals (CENTRUM SILVER PO) Take by mouth daily after breakfast.        . nitroGLYCERIN (NITROSTAT) 0.4 MG SL tablet Place 0.4 mg under the tongue every 5 (five) minutes as needed for chest pain.      Marland Kitchen triamterene-hydrochlorothiazide (DYAZIDE) 37.5-25 MG per capsule Take 1 each (1 capsule total) by mouth every morning.  90 capsule  3  . warfarin (COUMADIN) 5 MG tablet Take as directed by anticoagulation clinic  135 tablet  1   No current facility-administered medications on file prior to visit.    Allergies  Allergen Reactions  . Enoxaparin Sodium Hives  . Latex Rash  . Percocet [Oxycodone-Acetaminophen] Rash    Past Medical History  Diagnosis Date  . Chronic prostatitis   . Obstructive sleep apnea     CPAP-9  . Chronic ear infection   . Asthma   . GERD (gastroesophageal reflux disease)     laryngeal involvement  . Hypertension   . Osteoarthrosis, localized, primary, knee     post-traumatic  .  Diabetes mellitus   . Tachy-brady syndrome   . Atrial flutter 2007  . Benign prostatic hypertrophy   . Hyperlipidemia     Past Surgical History  Procedure Laterality Date  . Mastoidectomy  8/08    Dr [name]  . Rhinoplasty  5/10    and septoplasty  . Cataract extraction, bilateral  2009  . Belpharoptosis repair      Dr [name]---didn't resolve weepy eye and eyelid drooping  . Knee surgery  1998    plate after fracture, then removed for infection  . Cardioversion  04/13/2010  . Chest pain  8/12    Stress test benign  . Appendectomy    . Left elbow surgery    . Venous ablation    . Tear duct probing  11/13    Dr [name]  . Subacromial decompression Right 2005    Arthroscopic (for rotator cuff and biceps tendon ruptures)    Family History  Problem Relation Age of Onset  .  Other Mother     natural causes  . Colon cancer Father   . Diabetes Father     History   Social History  . Marital Status: Married    Spouse Name: N/A    Number of Children: 2  . 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 Smoking status: Former Smoker -- 1.00 packs/day for 0 years    Types: Cigarettes    Quit date: 01/03/1969  . Smokeless tobacco: Never Used  . Alcohol Use: No     Comment: rare wine  . Drug Use: No  . Sexual Activity: Not on file   Other Topics Concern  . Not on file   Social History Narrative   Has living will   Health care POA-- wife and then son Onalee Hua   Has DNR order from the past---form redone 06/25/10   Probably no feeding tube if cognitively unaware   Review of Systems Appetite is fine Weight is stable Sleeps well usually Bowels are fine--- varies but not a problem Still with right hand pain---can't make tight fist (mostly from 2nd MCP)    Objective:   Physical Exam  Constitutional: He is oriented to person, place, and time. He appears well-developed and well-nourished. No distress.  HENT:  Mouth/Throat: Oropharynx is clear and moist. No oropharyngeal exudate.  Neck: Normal range of motion. Neck supple. No thyromegaly present.  Cardiovascular: Normal rate, regular rhythm, normal heart sounds and intact distal pulses.  Exam reveals no gallop.   No murmur heard. Couple of skips  Pulmonary/Chest: Effort normal and breath sounds normal. No respiratory distress. He has no wheezes. He has no rales.  Abdominal: Soft. There is no tenderness.  Musculoskeletal: He exhibits edema. He exhibits no tenderness.  1+ edema  Lymphadenopathy:    He has no cervical adenopathy.  Neurological: He is alert and oriented to person, place, and time.  President-- "Susa Loffler, Mr Lurline Del Clinton" [id number] Recall 3/3  Skin: No rash noted.  No calf lesions    Psychiatric: He has a normal mood and affect. His behavior is normal.          Assessment & Plan:

## 2012-12-14 ENCOUNTER — Encounter (INDEPENDENT_AMBULATORY_CARE_PROVIDER_SITE_OTHER): Payer: Medicare Other

## 2012-12-14 DIAGNOSIS — G459 Transient cerebral ischemic attack, unspecified: Secondary | ICD-10-CM | POA: Diagnosis not present

## 2012-12-14 DIAGNOSIS — I6529 Occlusion and stenosis of unspecified carotid artery: Secondary | ICD-10-CM | POA: Diagnosis not present

## 2013-01-02 ENCOUNTER — Ambulatory Visit (INDEPENDENT_AMBULATORY_CARE_PROVIDER_SITE_OTHER): Payer: Medicare Other

## 2013-01-02 DIAGNOSIS — I4892 Unspecified atrial flutter: Secondary | ICD-10-CM | POA: Diagnosis not present

## 2013-01-02 DIAGNOSIS — Z7901 Long term (current) use of anticoagulants: Secondary | ICD-10-CM

## 2013-01-02 LAB — POCT INR: INR: 1.9

## 2013-01-28 ENCOUNTER — Telehealth: Payer: Self-pay | Admitting: *Deleted

## 2013-01-28 NOTE — Telephone Encounter (Signed)
Spoke w/ pt's wife.  She reports that pt's HR has been consistently in the 40s since 1/20. Pt denies symptoms, but he is interested in the pacemaker that was discussed at last visit. Please advise.  Thank you!

## 2013-01-28 NOTE — Telephone Encounter (Signed)
Patient's wife called and pulse rate has dropped down to about 43. Please advise

## 2013-01-28 NOTE — Telephone Encounter (Signed)
Called pt who tells me he has no symptoms/side effects from low HR, which has been low for about a week now. Will discuss with Dr. Caryl Comes this morning and get back with pt on recommendations. Pt agreeable to plan.

## 2013-01-28 NOTE — Telephone Encounter (Signed)
Advised as long as he is having no symptoms then to keep follow up appt with Dr. Rockey Situ in March. If symptoms start to call office and report, pt agreeable to plan.

## 2013-01-29 DIAGNOSIS — E1149 Type 2 diabetes mellitus with other diabetic neurological complication: Secondary | ICD-10-CM | POA: Diagnosis not present

## 2013-01-29 DIAGNOSIS — L851 Acquired keratosis [keratoderma] palmaris et plantaris: Secondary | ICD-10-CM | POA: Diagnosis not present

## 2013-01-29 DIAGNOSIS — G909 Disorder of the autonomic nervous system, unspecified: Secondary | ICD-10-CM | POA: Diagnosis not present

## 2013-01-29 DIAGNOSIS — IMO0001 Reserved for inherently not codable concepts without codable children: Secondary | ICD-10-CM | POA: Diagnosis not present

## 2013-01-29 DIAGNOSIS — B351 Tinea unguium: Secondary | ICD-10-CM | POA: Diagnosis not present

## 2013-01-30 ENCOUNTER — Ambulatory Visit (INDEPENDENT_AMBULATORY_CARE_PROVIDER_SITE_OTHER): Payer: Medicare Other

## 2013-01-30 DIAGNOSIS — Z5181 Encounter for therapeutic drug level monitoring: Secondary | ICD-10-CM | POA: Diagnosis not present

## 2013-01-30 DIAGNOSIS — Z7901 Long term (current) use of anticoagulants: Secondary | ICD-10-CM

## 2013-01-30 DIAGNOSIS — I4892 Unspecified atrial flutter: Secondary | ICD-10-CM | POA: Diagnosis not present

## 2013-01-30 LAB — POCT INR: INR: 2

## 2013-02-12 DIAGNOSIS — J018 Other acute sinusitis: Secondary | ICD-10-CM | POA: Diagnosis not present

## 2013-02-12 DIAGNOSIS — J328 Other chronic sinusitis: Secondary | ICD-10-CM | POA: Diagnosis not present

## 2013-02-12 DIAGNOSIS — H7190 Unspecified cholesteatoma, unspecified ear: Secondary | ICD-10-CM | POA: Diagnosis not present

## 2013-02-21 DIAGNOSIS — H7190 Unspecified cholesteatoma, unspecified ear: Secondary | ICD-10-CM | POA: Diagnosis not present

## 2013-02-24 ENCOUNTER — Other Ambulatory Visit: Payer: Self-pay | Admitting: Internal Medicine

## 2013-02-27 ENCOUNTER — Ambulatory Visit (INDEPENDENT_AMBULATORY_CARE_PROVIDER_SITE_OTHER): Payer: Medicare Other

## 2013-02-27 DIAGNOSIS — Z5181 Encounter for therapeutic drug level monitoring: Secondary | ICD-10-CM | POA: Diagnosis not present

## 2013-02-27 DIAGNOSIS — Z7901 Long term (current) use of anticoagulants: Secondary | ICD-10-CM | POA: Diagnosis not present

## 2013-02-27 DIAGNOSIS — I4892 Unspecified atrial flutter: Secondary | ICD-10-CM | POA: Diagnosis not present

## 2013-02-27 LAB — POCT INR: INR: 2.2

## 2013-03-28 ENCOUNTER — Ambulatory Visit (INDEPENDENT_AMBULATORY_CARE_PROVIDER_SITE_OTHER): Payer: Medicare Other

## 2013-03-28 ENCOUNTER — Encounter: Payer: Self-pay | Admitting: Cardiovascular Disease

## 2013-03-28 ENCOUNTER — Ambulatory Visit (INDEPENDENT_AMBULATORY_CARE_PROVIDER_SITE_OTHER): Payer: Medicare Other | Admitting: Cardiovascular Disease

## 2013-03-28 VITALS — BP 118/80 | HR 73 | Ht 71.0 in | Wt 238.2 lb

## 2013-03-28 DIAGNOSIS — E1169 Type 2 diabetes mellitus with other specified complication: Secondary | ICD-10-CM

## 2013-03-28 DIAGNOSIS — R609 Edema, unspecified: Secondary | ICD-10-CM

## 2013-03-28 DIAGNOSIS — Z7901 Long term (current) use of anticoagulants: Secondary | ICD-10-CM | POA: Diagnosis not present

## 2013-03-28 DIAGNOSIS — I498 Other specified cardiac arrhythmias: Secondary | ICD-10-CM

## 2013-03-28 DIAGNOSIS — I4892 Unspecified atrial flutter: Secondary | ICD-10-CM

## 2013-03-28 DIAGNOSIS — E669 Obesity, unspecified: Secondary | ICD-10-CM

## 2013-03-28 DIAGNOSIS — I10 Essential (primary) hypertension: Secondary | ICD-10-CM

## 2013-03-28 DIAGNOSIS — E785 Hyperlipidemia, unspecified: Secondary | ICD-10-CM

## 2013-03-28 DIAGNOSIS — R600 Localized edema: Secondary | ICD-10-CM

## 2013-03-28 DIAGNOSIS — Z5181 Encounter for therapeutic drug level monitoring: Secondary | ICD-10-CM

## 2013-03-28 DIAGNOSIS — E119 Type 2 diabetes mellitus without complications: Secondary | ICD-10-CM | POA: Diagnosis not present

## 2013-03-28 DIAGNOSIS — R001 Bradycardia, unspecified: Secondary | ICD-10-CM

## 2013-03-28 LAB — POCT INR: INR: 1.7

## 2013-03-28 NOTE — Assessment & Plan Note (Signed)
Relatively asymptomatic sinus bradycardia at this time. No indication for pacemaker. Previously seen by Dr. Caryl Comes

## 2013-03-28 NOTE — Assessment & Plan Note (Addendum)
Cholesterol is running high, encouraged weight loss and exercise. Was much better last year with total cholesterol 132, now 200. This was discussed with him

## 2013-03-28 NOTE — Assessment & Plan Note (Signed)
Blood pressure is well controlled on today's visit. No changes made to the medications. 

## 2013-03-28 NOTE — Assessment & Plan Note (Addendum)
Leg edema likely from venous insufficiency.Unable to exclude diastolic CHF.

## 2013-03-28 NOTE — Patient Instructions (Addendum)
You are doing well. No medication changes were made.  Consider RED YEAST RICE for cholesterol  Please call us if you have new issues that need to be addressed before your next appt.  Your physician wants you to follow-up in: 6 months.  You will receive a reminder letter in the mail two months in advance. If you don't receive a letter, please call our office to schedule the follow-up appointment.

## 2013-03-28 NOTE — Assessment & Plan Note (Signed)
Maintaining normal sinus rhythm. Given prior atrial fibrillation, possible prior TIA, suggested he stay on warfarin

## 2013-03-28 NOTE — Assessment & Plan Note (Signed)
We have encouraged continued exercise, careful diet management in an effort to lose weight. 

## 2013-03-28 NOTE — Progress Notes (Signed)
Patient ID: Michael Colon, male    DOB: 04-09-1930, 80 y.o.   MRN: [id number]  HPI Comments: Michael Colon is a very pleasant 80 year old gentleman with history of atrial flutter (3/14), previous ablation , on warfarin, hypertension, borderline diabetes, Obstructive sleep apnea who presented to [facility] on August 04, 2010 with chest pain. Symptoms started after eating and it was felt he had GI spasm or hiatal hernia. Mild improvement in symptoms with nitroglycerin and GI cocktail in the emergency room. He is very active at baseline. Noted to be in atrial flutter March 2014. He reports having a TIA some time in 2014, workup negative at [facility]  In followup, he was seen in our clinic for malaise, fatigue," slowing down"per his wife. EKG showed sinus bradycardia (he had converted out of atrial flutter)  Holter monitor showed normal sinus rhythm with pauses up to 2.86 seconds, bradycardia with heart rates in the 20s to 30s at nighttime, 30s to 50 during the daytime.   He was seen by Dr. Caryl Comes and watchful waiting was recommended for pacemaker placement In followup today, he reports that in general he feels well. Rarely has heart rates in the 40s. In general heart rate 50-70. He is active, tries to exercise daily. Weight continues to be a problem. Hemoglobin A1c in December 2014 7.5, total cholesterol up from 132 now 200  EKG today shows normal sinus rhythm with rate 73 beats per minute, right bundle branch block   Outpatient Encounter Prescriptions as of 03/28/2013  Medication Sig  . Calcium Carbonate-Vitamin D (CALCIUM-CARB 600 + D) 600-125 MG-UNIT TABS Take by mouth.    . docusate sodium (COLACE) 100 MG capsule Take 100 mg by mouth 2 (two) times daily.   . famotidine (PEPCID) 20 MG tablet Take 20 mg by mouth 1 dose over 24 hours.   . finasteride (PROSCAR) 5 MG tablet TAKE ONE TABLET BY MOUTH ONCE DAILY  . fish oil-omega-3 fatty acids 1000 MG capsule Take 1 g by mouth daily.   . Glucosamine  Sulfate-MSM (GLUCOSAMINE-MSM DS) 500-500 MG TABS Take 1,500 mg/day by mouth daily.  Marland Kitchen glucose blood test strip Use as instructed to test blood sugar once daily dx: 250.00  . loratadine (CLARITIN) 10 MG tablet Take 10 mg by mouth daily.    Marland Kitchen MICROLET LANCETS MISC Use to test twice daily or as directed  . Multiple Vitamins-Minerals (CENTRUM SILVER PO) Take by mouth daily after breakfast.    . nitroGLYCERIN (NITROSTAT) 0.4 MG SL tablet Place 0.4 mg under the tongue every 5 (five) minutes as needed for chest pain.  Marland Kitchen triamterene-hydrochlorothiazide (DYAZIDE) 37.5-25 MG per capsule Take 1 each (1 capsule total) by mouth every morning.  . warfarin (COUMADIN) 5 MG tablet Take as directed by anticoagulation clinic   Review of Systems  HENT: Negative.   Eyes: Negative.   Respiratory: Negative.   Gastrointestinal: Negative.   Endocrine: Negative.   Musculoskeletal: Negative.   Skin: Negative.   Allergic/Immunologic: Negative.   Neurological: Negative.   Hematological: Negative.   Psychiatric/Behavioral: Negative.   All other systems reviewed and are negative.    BP 118/80  Pulse 73  Ht 5\' 11"  (1.803 m)  Wt 238 lb 4 oz (108.069 kg)  BMI 33.24 kg/m2  Physical Exam  Nursing note and vitals reviewed. Constitutional: He is oriented to person, place, and time. He appears well-developed and well-nourished.  HENT:  Head: Normocephalic.  Nose: Nose normal.  Mouth/Throat: Oropharynx is clear and moist.  Eyes: Conjunctivae  are normal. Pupils are equal, round, and reactive to light.  Neck: Normal range of motion. Neck supple. No JVD present.  Cardiovascular: Normal rate, regular rhythm, S1 normal, S2 normal, normal heart sounds and intact distal pulses.  Exam reveals no gallop and no friction rub.   No murmur heard. Pulmonary/Chest: Effort normal and breath sounds normal. No respiratory distress. He has no wheezes. He has no rales. He exhibits no tenderness.  Abdominal: Soft. Bowel sounds are  normal. He exhibits no distension. There is no tenderness.  Musculoskeletal: Normal range of motion. He exhibits edema. He exhibits no tenderness.  Lymphadenopathy:    He has no cervical adenopathy.  Neurological: He is alert and oriented to person, place, and time. Coordination normal.  Skin: Skin is warm and dry. No rash noted. No erythema.  Psychiatric: He has a normal mood and affect. His behavior is normal. Judgment and thought content normal.      Assessment and Plan

## 2013-04-08 DIAGNOSIS — L259 Unspecified contact dermatitis, unspecified cause: Secondary | ICD-10-CM | POA: Diagnosis not present

## 2013-04-08 DIAGNOSIS — L738 Other specified follicular disorders: Secondary | ICD-10-CM | POA: Diagnosis not present

## 2013-04-08 DIAGNOSIS — L678 Other hair color and hair shaft abnormalities: Secondary | ICD-10-CM | POA: Diagnosis not present

## 2013-04-10 ENCOUNTER — Ambulatory Visit (INDEPENDENT_AMBULATORY_CARE_PROVIDER_SITE_OTHER): Payer: Medicare Other

## 2013-04-10 DIAGNOSIS — I4892 Unspecified atrial flutter: Secondary | ICD-10-CM | POA: Diagnosis not present

## 2013-04-10 DIAGNOSIS — Z7901 Long term (current) use of anticoagulants: Secondary | ICD-10-CM

## 2013-04-10 DIAGNOSIS — Z5181 Encounter for therapeutic drug level monitoring: Secondary | ICD-10-CM

## 2013-04-10 LAB — POCT INR: INR: 1.9

## 2013-04-24 ENCOUNTER — Ambulatory Visit (INDEPENDENT_AMBULATORY_CARE_PROVIDER_SITE_OTHER): Payer: Medicare Other

## 2013-04-24 DIAGNOSIS — Z5181 Encounter for therapeutic drug level monitoring: Secondary | ICD-10-CM

## 2013-04-24 DIAGNOSIS — I4892 Unspecified atrial flutter: Secondary | ICD-10-CM | POA: Diagnosis not present

## 2013-04-24 DIAGNOSIS — Z7901 Long term (current) use of anticoagulants: Secondary | ICD-10-CM

## 2013-04-24 LAB — POCT INR: INR: 2

## 2013-04-30 DIAGNOSIS — IMO0001 Reserved for inherently not codable concepts without codable children: Secondary | ICD-10-CM | POA: Diagnosis not present

## 2013-04-30 DIAGNOSIS — E1149 Type 2 diabetes mellitus with other diabetic neurological complication: Secondary | ICD-10-CM | POA: Diagnosis not present

## 2013-04-30 DIAGNOSIS — E1142 Type 2 diabetes mellitus with diabetic polyneuropathy: Secondary | ICD-10-CM | POA: Diagnosis not present

## 2013-04-30 DIAGNOSIS — L851 Acquired keratosis [keratoderma] palmaris et plantaris: Secondary | ICD-10-CM | POA: Diagnosis not present

## 2013-04-30 DIAGNOSIS — B351 Tinea unguium: Secondary | ICD-10-CM | POA: Diagnosis not present

## 2013-05-15 ENCOUNTER — Ambulatory Visit (INDEPENDENT_AMBULATORY_CARE_PROVIDER_SITE_OTHER): Payer: Medicare Other

## 2013-05-15 DIAGNOSIS — I4892 Unspecified atrial flutter: Secondary | ICD-10-CM | POA: Diagnosis not present

## 2013-05-15 DIAGNOSIS — Z5181 Encounter for therapeutic drug level monitoring: Secondary | ICD-10-CM

## 2013-05-15 DIAGNOSIS — Z7901 Long term (current) use of anticoagulants: Secondary | ICD-10-CM

## 2013-05-15 LAB — POCT INR: INR: 2.3

## 2013-05-21 ENCOUNTER — Other Ambulatory Visit: Payer: Self-pay | Admitting: Cardiovascular Disease

## 2013-05-21 NOTE — Telephone Encounter (Signed)
Please review for refill, Thank You. 

## 2013-06-10 DIAGNOSIS — L678 Other hair color and hair shaft abnormalities: Secondary | ICD-10-CM | POA: Diagnosis not present

## 2013-06-10 DIAGNOSIS — L738 Other specified follicular disorders: Secondary | ICD-10-CM | POA: Diagnosis not present

## 2013-06-10 DIAGNOSIS — L259 Unspecified contact dermatitis, unspecified cause: Secondary | ICD-10-CM | POA: Diagnosis not present

## 2013-06-11 ENCOUNTER — Ambulatory Visit (INDEPENDENT_AMBULATORY_CARE_PROVIDER_SITE_OTHER)
Admission: RE | Admit: 2013-06-11 | Discharge: 2013-06-11 | Disposition: A | Discharge status: Home / Self Care | Payer: Medicare Other | Source: Ambulatory Visit | Attending: Internal Medicine | Admitting: Internal Medicine

## 2013-06-11 ENCOUNTER — Encounter: Payer: Self-pay | Admitting: Internal Medicine

## 2013-06-11 ENCOUNTER — Ambulatory Visit (INDEPENDENT_AMBULATORY_CARE_PROVIDER_SITE_OTHER): Payer: Medicare Other | Admitting: Internal Medicine

## 2013-06-11 VITALS — BP 110/70 | HR 66 | Temp 97.8°F | Wt 236.0 lb

## 2013-06-11 DIAGNOSIS — I4892 Unspecified atrial flutter: Secondary | ICD-10-CM

## 2013-06-11 DIAGNOSIS — I10 Essential (primary) hypertension: Secondary | ICD-10-CM

## 2013-06-11 DIAGNOSIS — M1710 Unilateral primary osteoarthritis, unspecified knee: Secondary | ICD-10-CM | POA: Diagnosis not present

## 2013-06-11 DIAGNOSIS — E119 Type 2 diabetes mellitus without complications: Secondary | ICD-10-CM | POA: Diagnosis not present

## 2013-06-11 DIAGNOSIS — E1169 Type 2 diabetes mellitus with other specified complication: Secondary | ICD-10-CM

## 2013-06-11 DIAGNOSIS — M25569 Pain in unspecified knee: Secondary | ICD-10-CM | POA: Diagnosis not present

## 2013-06-11 DIAGNOSIS — E785 Hyperlipidemia, unspecified: Secondary | ICD-10-CM

## 2013-06-11 DIAGNOSIS — E669 Obesity, unspecified: Secondary | ICD-10-CM

## 2013-06-11 LAB — MICROALBUMIN / CREATININE URINE RATIO
Creatinine,U: 161.6 mg/dL
Microalb Creat Ratio: 0.6 mg/g (ref 0.0–30.0)
Microalb, Ur: 1 mg/dL (ref 0.0–1.9)

## 2013-06-11 LAB — RENAL FUNCTION PANEL
Albumin: 3.8 g/dL (ref 3.5–5.2)
BUN: 25 mg/dL — ABNORMAL HIGH (ref 6–23)
CO2: 29 mEq/L (ref 19–32)
Calcium: 10.8 mg/dL — ABNORMAL HIGH (ref 8.4–10.5)
Chloride: 103 mEq/L (ref 96–112)
Creatinine, Ser: 0.9 mg/dL (ref 0.4–1.5)
GFR: 87.29 mL/min (ref >60.00)
Glucose, Bld: 129 mg/dL — ABNORMAL HIGH (ref 70–99)
Phosphorus: 2.4 mg/dL (ref 2.3–4.6)
Potassium: 4.2 mEq/L (ref 3.5–5.1)
Sodium: 139 mEq/L (ref 135–145)

## 2013-06-11 LAB — HM DIABETES FOOT EXAM

## 2013-06-11 LAB — HEMOGLOBIN A1C: Hgb A1c MFr Bld: 7.4 % — ABNORMAL HIGH (ref 4.6–6.5)

## 2013-06-11 MED ORDER — CEPHALEXIN 500 MG PO CAPS: 500.0000 mg | ORAL_CAPSULE | Freq: Four times a day (QID) | ORAL | Status: DC

## 2013-06-11 NOTE — Progress Notes (Signed)
Pre visit review using our clinic review tool, if applicable. No additional management support is needed unless otherwise documented below in the visit note. 

## 2013-06-11 NOTE — Assessment & Plan Note (Signed)
Seems to still have good control Will check A1c 

## 2013-06-11 NOTE — Progress Notes (Signed)
Subjective:    Patient ID: Michael Colon, male    DOB: 11-Oct-1930, 80 y.o.   MRN: [id number]  HPI Doing fairly well  Does have a "weeping lesion" on leg Has some clear fluid from his "bad leg'--on left This usually converts into infection Some puffiness and soreness  Ongoing pain with right 2nd MCP and PIP Decreased flexion and grip Pain after writing for a while Does get some relief with OTC topical and ibuprofen 400 bid  Still with problems in left knee Broken femur at knee in past Knows he has some arthritis Ibuprofen helps this After sitting, has to get up slowly Has to be very careful going down a hill (or stairs)  Checks sugars every 4-5 days Range 112-130 fasting No hypoglycemia Has eye exam coming up--last in July No sores or pain in feet. Just has trouble doing nail care on left (elbow issue) Regular with podiatrist  No chest pain No SOB No dizziness or syncope  Voids okay Some urgency if he delays Slow stream at end and occasional dribbling  Current Outpatient Prescriptions on File Prior to Visit  Medication Sig Dispense Refill  . Calcium Carbonate-Vitamin D (CALCIUM-CARB 600 + D) 600-125 MG-UNIT TABS Take by mouth.        . docusate sodium (COLACE) 100 MG capsule Take 100 mg by mouth 2 (two) times daily.       . famotidine (PEPCID) 20 MG tablet Take 20 mg by mouth 1 dose over 24 hours.       . finasteride (PROSCAR) 5 MG tablet TAKE ONE TABLET BY MOUTH ONCE DAILY  90 tablet  0  . fish oil-omega-3 fatty acids 1000 MG capsule Take 1 g by mouth daily.       . Glucosamine Sulfate-MSM (GLUCOSAMINE-MSM DS) 500-500 MG TABS Take 1,500 mg/day by mouth daily.      Marland Kitchen glucose blood test strip Use as instructed to test blood sugar once daily dx: 250.00      . loratadine (CLARITIN) 10 MG tablet Take 10 mg by mouth daily.        Marland Kitchen MICROLET LANCETS MISC Use to test twice daily or as directed  100 each  3  . Multiple Vitamins-Minerals (CENTRUM SILVER PO) Take by  mouth daily after breakfast.        . nitroGLYCERIN (NITROSTAT) 0.4 MG SL tablet Place 0.4 mg under the tongue every 5 (five) minutes as needed for chest pain.      Marland Kitchen triamterene-hydrochlorothiazide (DYAZIDE) 37.5-25 MG per capsule Take 1 each (1 capsule total) by mouth every morning.  90 capsule  3  . warfarin (COUMADIN) 5 MG tablet TAKE AS DIRECTED BY ANTICOAGULATION CLINIC  135 tablet  1   No current facility-administered medications on file prior to visit.    Allergies  Allergen Reactions  . Enoxaparin Sodium Hives  . Latex Rash  . Percocet [Oxycodone-Acetaminophen] Rash    Past Medical History  Diagnosis Date  . Chronic prostatitis   . Obstructive sleep apnea     CPAP-9  . Chronic ear infection   . Asthma   . GERD (gastroesophageal reflux disease)     laryngeal involvement  . Hypertension   . Osteoarthrosis, localized, primary, knee     post-traumatic  . Diabetes mellitus   . Tachy-brady syndrome   . Atrial flutter 2007  . Benign prostatic hypertrophy   . Hyperlipidemia     Past Surgical History  Procedure Laterality Date  . Mastoidectomy  8/08  Dr Idelle Crouch  . Rhinoplasty  5/10    and septoplasty  . Cataract extraction, bilateral  2009  . Belpharoptosis repair      Dr [name]---didn't resolve weepy eye and eyelid drooping  . Knee surgery  1998    plate after fracture, then removed for infection  . Cardioversion  04/13/2010  . Chest pain  8/12    Stress test benign  . Appendectomy    . Left elbow surgery    . Venous ablation    . Tear duct probing  11/13    Dr Vickki Muff  . Subacromial decompression Right 2005    Arthroscopic (for rotator cuff and biceps tendon ruptures)    Family History  Problem Relation Age of Onset  . Other Mother     natural causes  . Colon cancer Father   . Diabetes Father     History   Social History  . Marital Status: Married    Spouse Name: N/A    Number of Children: 2  . Years of Education: N/A   Occupational History   . 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 Smoking status: Former Smoker -- 1.00 packs/day for 0 years    Types: Cigarettes    Quit date: 01/03/1969  . Smokeless tobacco: Never Used  . Alcohol Use: No     Comment: rare wine  . Drug Use: No  . Sexual Activity: Not on file   Other Topics Concern  . Not on file   Social History Narrative   Has living will   Health care POA-- wife and then son Shanon Brow   Has DNR order from the past---form redone 06/25/10   Probably no feeding tube if cognitively unaware   Review of Systems Sleeps fairly well---CPAP Weight is down 2# since last visit      Objective:   Physical Exam  Constitutional: He appears well-developed and well-nourished. No distress.  Neck: Normal range of motion. Neck supple. No thyromegaly present.  Cardiovascular: Normal rate, regular rhythm, normal heart sounds and intact distal pulses.  Exam reveals no gallop.   No murmur heard. Pulmonary/Chest: Effort normal and breath sounds normal. No respiratory distress. He has no wheezes. He has no rales.  Musculoskeletal: He exhibits edema.  Left knee crepitus Mild ACL laxity as well No meniscus findings  Mild left calf edema  Lymphadenopathy:    He has no cervical adenopathy.  Skin:  Small open area on left upper calf--no infection (cephalexin Rx given just in case it worsens)  No foot lesions Nails well trimmed  Psychiatric: He has a normal mood and affect. His behavior is normal.          Assessment & Plan:

## 2013-06-11 NOTE — Assessment & Plan Note (Signed)
Lab Results  Component Value Date   LDLCALC 124* 12/11/2012   Has never been on meds He is concerned about side effects Only diet controlled DM Not excited about meds--will hold off

## 2013-06-11 NOTE — Assessment & Plan Note (Signed)
Will check x-ray Some ACL laxity---discussed brace for support Not ready for TKR though

## 2013-06-11 NOTE — Assessment & Plan Note (Signed)
Regular On coumadin 

## 2013-06-11 NOTE — Assessment & Plan Note (Signed)
BP Readings from Last 3 Encounters:  06/11/13 110/70  03/28/13 118/80  12/11/12 128/80   Good control Will check urine microal

## 2013-06-11 NOTE — Addendum Note (Signed)
Addended by: [name] I on: 06/11/2013 10:08 AM   Modules accepted: Orders

## 2013-06-12 ENCOUNTER — Ambulatory Visit (INDEPENDENT_AMBULATORY_CARE_PROVIDER_SITE_OTHER): Payer: Medicare Other

## 2013-06-12 ENCOUNTER — Telehealth: Payer: Self-pay | Admitting: Internal Medicine

## 2013-06-12 DIAGNOSIS — Z7901 Long term (current) use of anticoagulants: Secondary | ICD-10-CM

## 2013-06-12 DIAGNOSIS — I4892 Unspecified atrial flutter: Secondary | ICD-10-CM

## 2013-06-12 DIAGNOSIS — Z5181 Encounter for therapeutic drug level monitoring: Secondary | ICD-10-CM | POA: Diagnosis not present

## 2013-06-12 LAB — POCT INR: INR: 2.3

## 2013-06-12 NOTE — Telephone Encounter (Signed)
Relevant patient education assigned to patient using Emmi. ° °

## 2013-06-22 ENCOUNTER — Other Ambulatory Visit: Payer: Self-pay | Admitting: Internal Medicine

## 2013-06-28 DIAGNOSIS — H18429 Band keratopathy, unspecified eye: Secondary | ICD-10-CM | POA: Diagnosis not present

## 2013-07-09 DIAGNOSIS — M2040 Other hammer toe(s) (acquired), unspecified foot: Secondary | ICD-10-CM | POA: Diagnosis not present

## 2013-07-24 ENCOUNTER — Ambulatory Visit (INDEPENDENT_AMBULATORY_CARE_PROVIDER_SITE_OTHER): Payer: Medicare Other

## 2013-07-24 DIAGNOSIS — Z7901 Long term (current) use of anticoagulants: Secondary | ICD-10-CM | POA: Diagnosis not present

## 2013-07-24 DIAGNOSIS — Z5181 Encounter for therapeutic drug level monitoring: Secondary | ICD-10-CM | POA: Diagnosis not present

## 2013-07-24 DIAGNOSIS — I4892 Unspecified atrial flutter: Secondary | ICD-10-CM

## 2013-07-24 LAB — POCT INR: INR: 2.3

## 2013-07-26 DIAGNOSIS — H18429 Band keratopathy, unspecified eye: Secondary | ICD-10-CM | POA: Diagnosis not present

## 2013-08-09 DIAGNOSIS — H18429 Band keratopathy, unspecified eye: Secondary | ICD-10-CM | POA: Diagnosis not present

## 2013-08-19 LAB — HM DIABETES EYE EXAM

## 2013-09-04 ENCOUNTER — Ambulatory Visit (INDEPENDENT_AMBULATORY_CARE_PROVIDER_SITE_OTHER): Payer: Medicare Other

## 2013-09-04 DIAGNOSIS — I4892 Unspecified atrial flutter: Secondary | ICD-10-CM | POA: Diagnosis not present

## 2013-09-04 DIAGNOSIS — Z5181 Encounter for therapeutic drug level monitoring: Secondary | ICD-10-CM

## 2013-09-04 DIAGNOSIS — Z7901 Long term (current) use of anticoagulants: Secondary | ICD-10-CM

## 2013-09-04 LAB — POCT INR: INR: 1.8

## 2013-09-08 ENCOUNTER — Emergency Department: Payer: Self-pay | Admitting: Emergency Medicine

## 2013-09-08 DIAGNOSIS — N509 Disorder of male genital organs, unspecified: Secondary | ICD-10-CM | POA: Diagnosis not present

## 2013-09-08 DIAGNOSIS — N419 Inflammatory disease of prostate, unspecified: Secondary | ICD-10-CM | POA: Diagnosis not present

## 2013-09-08 DIAGNOSIS — N410 Acute prostatitis: Secondary | ICD-10-CM | POA: Diagnosis not present

## 2013-09-08 DIAGNOSIS — Z7901 Long term (current) use of anticoagulants: Secondary | ICD-10-CM | POA: Diagnosis not present

## 2013-09-08 DIAGNOSIS — R509 Fever, unspecified: Secondary | ICD-10-CM | POA: Diagnosis not present

## 2013-09-08 DIAGNOSIS — Z91040 Latex allergy status: Secondary | ICD-10-CM | POA: Diagnosis not present

## 2013-09-08 DIAGNOSIS — Z79899 Other long term (current) drug therapy: Secondary | ICD-10-CM | POA: Diagnosis not present

## 2013-09-08 DIAGNOSIS — R5381 Other malaise: Secondary | ICD-10-CM | POA: Diagnosis not present

## 2013-09-08 DIAGNOSIS — R0602 Shortness of breath: Secondary | ICD-10-CM | POA: Diagnosis not present

## 2013-09-08 LAB — COMPREHENSIVE METABOLIC PANEL
Albumin: 3 g/dL — ABNORMAL LOW (ref 3.4–5.0)
Alkaline Phosphatase: 73 U/L
Anion Gap: 9 (ref 7–16)
BUN: 18 mg/dL (ref 7–18)
Bilirubin,Total: 0.8 mg/dL (ref 0.2–1.0)
Calcium, Total: 10.1 mg/dL (ref 8.5–10.1)
Chloride: 100 mmol/L (ref 98–107)
Co2: 22 mmol/L (ref 21–32)
Creatinine: 0.93 mg/dL (ref 0.60–1.30)
EGFR (African American): >60
EGFR (Non-African Amer.): >60
Glucose: 187 mg/dL — ABNORMAL HIGH (ref 65–99)
Osmolality: 269 (ref 275–301)
Potassium: 3.9 mmol/L (ref 3.5–5.1)
SGOT(AST): 30 U/L (ref 15–37)
SGPT (ALT): 22 U/L
Sodium: 131 mmol/L — ABNORMAL LOW (ref 136–145)
Total Protein: 7.3 g/dL (ref 6.4–8.2)

## 2013-09-08 LAB — CBC WITH DIFFERENTIAL/PLATELET
Basophil #: 0 10*3/uL (ref 0.0–0.1)
Basophil %: 0.1 %
Eosinophil #: 0 10*3/uL (ref 0.0–0.7)
Eosinophil %: 0.2 %
HCT: 51.1 % (ref 40.0–52.0)
HGB: 17.2 g/dL (ref 13.0–18.0)
Lymphocyte #: 0.6 10*3/uL — ABNORMAL LOW (ref 1.0–3.6)
Lymphocyte %: 3.8 %
MCH: 31.4 pg (ref 26.0–34.0)
MCHC: 33.7 g/dL (ref 32.0–36.0)
MCV: 93 fL (ref 80–100)
Monocyte #: 1.3 x10 3/mm — ABNORMAL HIGH (ref 0.2–1.0)
Monocyte %: 8.8 %
Neutrophil #: 12.5 10*3/uL — ABNORMAL HIGH (ref 1.4–6.5)
Neutrophil %: 87.1 %
Platelet: 93 10*3/uL — ABNORMAL LOW (ref 150–440)
RBC: 5.49 10*6/uL (ref 4.40–5.90)
RDW: 13.4 % (ref 11.5–14.5)
WBC: 14.4 10*3/uL — ABNORMAL HIGH (ref 3.8–10.6)

## 2013-09-08 LAB — URINALYSIS, COMPLETE
Bilirubin,UR: NEGATIVE
Glucose,UR: 50 mg/dL (ref 0–75)
Hyaline Cast: 1
Ketone: NEGATIVE
Nitrite: POSITIVE
Ph: 5 (ref 4.5–8.0)
Protein: 30
RBC,UR: 2 /HPF (ref 0–5)
Specific Gravity: 1.018 (ref 1.003–1.030)
Squamous Epithelial: NONE SEEN
WBC UR: 73 /HPF (ref 0–5)

## 2013-09-10 LAB — URINE CULTURE

## 2013-09-11 ENCOUNTER — Ambulatory Visit (INDEPENDENT_AMBULATORY_CARE_PROVIDER_SITE_OTHER): Payer: Medicare Other

## 2013-09-11 ENCOUNTER — Telehealth: Payer: Self-pay

## 2013-09-11 DIAGNOSIS — I4892 Unspecified atrial flutter: Secondary | ICD-10-CM

## 2013-09-11 DIAGNOSIS — Z7901 Long term (current) use of anticoagulants: Secondary | ICD-10-CM | POA: Diagnosis not present

## 2013-09-11 DIAGNOSIS — Z5181 Encounter for therapeutic drug level monitoring: Secondary | ICD-10-CM | POA: Diagnosis not present

## 2013-09-11 LAB — POCT INR: INR: 2.9

## 2013-09-11 NOTE — Telephone Encounter (Signed)
Pt states he is taking an antibiotic and needs to know if he needs changes. Please call and advise

## 2013-09-11 NOTE — Telephone Encounter (Signed)
Returned call to pt, started on Levaquin on 09/08/13 500mg  QD x 30 days. Made pt appt in Coumadin clinic today for INR and medication adjustment if necessary.

## 2013-09-13 LAB — CULTURE, BLOOD (SINGLE)

## 2013-09-16 ENCOUNTER — Other Ambulatory Visit: Payer: Self-pay | Admitting: Internal Medicine

## 2013-09-17 ENCOUNTER — Telehealth: Payer: Self-pay | Admitting: Internal Medicine

## 2013-09-17 ENCOUNTER — Encounter: Payer: Self-pay | Admitting: Internal Medicine

## 2013-09-17 NOTE — Telephone Encounter (Signed)
Michael Colon is coming in [place], 09/19/2013 for an ER follow up. He was seen at [facility] 09/08/2013 and is going to bring his discharge papers, but asked if we would get the full report from [facility].  I told him I would let you know. Thank you.

## 2013-09-17 NOTE — Telephone Encounter (Signed)
Thank you I will get the records

## 2013-09-18 ENCOUNTER — Ambulatory Visit (INDEPENDENT_AMBULATORY_CARE_PROVIDER_SITE_OTHER): Payer: Medicare Other

## 2013-09-18 DIAGNOSIS — Z5181 Encounter for therapeutic drug level monitoring: Secondary | ICD-10-CM | POA: Diagnosis not present

## 2013-09-18 DIAGNOSIS — Z7901 Long term (current) use of anticoagulants: Secondary | ICD-10-CM | POA: Diagnosis not present

## 2013-09-18 DIAGNOSIS — I4892 Unspecified atrial flutter: Secondary | ICD-10-CM

## 2013-09-18 LAB — POCT INR: INR: 1.4

## 2013-09-19 ENCOUNTER — Encounter: Payer: Self-pay | Admitting: Internal Medicine

## 2013-09-19 ENCOUNTER — Ambulatory Visit (INDEPENDENT_AMBULATORY_CARE_PROVIDER_SITE_OTHER): Payer: Medicare Other | Admitting: Internal Medicine

## 2013-09-19 VITALS — BP 120/80 | HR 59 | Temp 97.4°F | Wt 234.0 lb

## 2013-09-19 DIAGNOSIS — N411 Chronic prostatitis: Secondary | ICD-10-CM | POA: Diagnosis not present

## 2013-09-19 DIAGNOSIS — Z23 Encounter for immunization: Secondary | ICD-10-CM | POA: Diagnosis not present

## 2013-09-19 DIAGNOSIS — N390 Urinary tract infection, site not specified: Secondary | ICD-10-CM | POA: Diagnosis not present

## 2013-09-19 MED ORDER — TAMSULOSIN HCL 0.4 MG PO CAPS: 0.4000 mg | ORAL_CAPSULE | Freq: Every day | ORAL | Status: DC

## 2013-09-19 NOTE — Assessment & Plan Note (Signed)
With possible acute episode I am concerned about possible urinary retention Will continue finasteride Restart tamsulosin May need to see urologist again

## 2013-09-19 NOTE — Addendum Note (Signed)
Addended by: Despina Hidden on: 09/19/2013 04:23 PM   Modules accepted: Orders

## 2013-09-19 NOTE — Assessment & Plan Note (Signed)
Urine culture positive for Klebsiella pneumoniae--resistant to ampicillin and intermediate for nitrofurantoin Will finish out the levaquin

## 2013-09-19 NOTE — Progress Notes (Signed)
Pre visit review using our clinic review tool, if applicable. No additional management support is needed unless otherwise documented below in the visit note. 

## 2013-09-19 NOTE — Progress Notes (Signed)
Subjective:    Patient ID: Michael Colon, male    DOB: 24-Aug-1930, 80 y.o.   MRN: [id number]  HPI Started feeling sick on 9/4--some chills Somewhat better the next day but then really got worse Seen in ER at [facility] on 9/6 Fever and "racking chills" Report reviewed  Diagnosed with prostatitis On levaquin now  Feels considerably better now  He generally voids okay Now back to voiding every 4-5 hours (was every 30-60 minutes) Nocturia x 1-2 Feels that he empties okay  Now notes some black discoloration in testes  Current Outpatient Prescriptions on File Prior to Visit  Medication Sig Dispense Refill  . docusate sodium (COLACE) 100 MG capsule Take 100 mg by mouth 2 (two) times daily.       . famotidine (PEPCID) 20 MG tablet Take 20 mg by mouth 1 dose over 24 hours.       . finasteride (PROSCAR) 5 MG tablet TAKE ONE TABLET BY MOUTH ONCE DAILY  90 tablet  0  . fish oil-omega-3 fatty acids 1000 MG capsule Take 1 g by mouth daily.       . Glucosamine Sulfate-MSM (GLUCOSAMINE-MSM DS) 500-500 MG TABS Take 1,500 mg/day by mouth daily.      Marland Kitchen glucose blood test strip Use as instructed to test blood sugar once daily dx: 250.00      . loratadine (CLARITIN) 10 MG tablet Take 10 mg by mouth daily.        Marland Kitchen MICROLET LANCETS MISC Use to test twice daily or as directed  100 each  3  . Multiple Vitamins-Minerals (CENTRUM SILVER PO) Take by mouth daily after breakfast.        . nitroGLYCERIN (NITROSTAT) 0.4 MG SL tablet Place 0.4 mg under the tongue every 5 (five) minutes as needed for chest pain.      Marland Kitchen triamterene-hydrochlorothiazide (DYAZIDE) 37.5-25 MG per capsule Take 1 each (1 capsule total) by mouth every morning.  90 capsule  3  . warfarin (COUMADIN) 5 MG tablet TAKE AS DIRECTED BY ANTICOAGULATION CLINIC  135 tablet  1   No current facility-administered medications on file prior to visit.    Allergies  Allergen Reactions  . Enoxaparin Sodium Hives  . Latex Rash  . Percocet  [Oxycodone-Acetaminophen] Rash    Past Medical History  Diagnosis Date  . Chronic prostatitis   . Obstructive sleep apnea     CPAP-9  . Chronic ear infection   . Asthma   . GERD (gastroesophageal reflux disease)     laryngeal involvement  . Hypertension   . Osteoarthrosis, localized, primary, knee     post-traumatic  . Diabetes mellitus   . Tachy-brady syndrome   . Atrial flutter 2007  . Benign prostatic hypertrophy   . Hyperlipidemia   . Band keratopathy     Past Surgical History  Procedure Laterality Date  . Mastoidectomy  8/08    Dr Idelle Crouch  . Rhinoplasty  5/10    and septoplasty  . Cataract extraction, bilateral  2009  . Belpharoptosis repair      Dr [name]---didn't resolve weepy eye and eyelid drooping  . Knee surgery  1998    plate after fracture, then removed for infection  . Cardioversion  04/13/2010  . Chest pain  8/12    Stress test benign  . Appendectomy    . Left elbow surgery    . Venous ablation    . Tear duct probing  11/13    Dr Vickki Muff  .  Subacromial decompression Right 2005    Arthroscopic (for rotator cuff and biceps tendon ruptures)    Family History  Problem Relation Age of Onset  . Other Mother     natural causes  . Colon cancer Father   . Diabetes Father     History   Social History  . Marital Status: Married    Spouse Name: N/A    Number of Children: 2  . 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 Smoking status: Former Smoker -- 1.00 packs/day for 0 years    Types: Cigarettes    Quit date: 01/03/1969  . Smokeless tobacco: Never Used  . Alcohol Use: No     Comment: rare wine  . Drug Use: No  . Sexual Activity: Not on file   Other Topics Concern  . Not on file   Social History Narrative   Has living will   Health care POA-- wife and then son Shanon Brow   Has DNR order from the past---form redone 06/25/10   Probably no feeding tube if cognitively  unaware   Review of Systems Appetite is okay No fever now No nausea or vomiting    Objective:   Physical Exam  Constitutional: He appears well-developed and well-nourished. No distress.  Abdominal: There is no tenderness.  Genitourinary:  Scrotum is fine No swelling or tenderness Urethra is normal          Assessment & Plan:

## 2013-09-25 ENCOUNTER — Ambulatory Visit (INDEPENDENT_AMBULATORY_CARE_PROVIDER_SITE_OTHER): Payer: Medicare Other | Admitting: Cardiovascular Disease

## 2013-09-25 ENCOUNTER — Ambulatory Visit (INDEPENDENT_AMBULATORY_CARE_PROVIDER_SITE_OTHER): Payer: Medicare Other | Admitting: *Deleted

## 2013-09-25 ENCOUNTER — Encounter: Payer: Self-pay | Admitting: Cardiovascular Disease

## 2013-09-25 VITALS — BP 116/70 | HR 50 | Ht 71.0 in | Wt 236.5 lb

## 2013-09-25 DIAGNOSIS — I472 Ventricular tachycardia, unspecified: Secondary | ICD-10-CM

## 2013-09-25 DIAGNOSIS — I4892 Unspecified atrial flutter: Secondary | ICD-10-CM | POA: Diagnosis not present

## 2013-09-25 DIAGNOSIS — I4729 Other ventricular tachycardia: Secondary | ICD-10-CM

## 2013-09-25 DIAGNOSIS — Z5181 Encounter for therapeutic drug level monitoring: Secondary | ICD-10-CM

## 2013-09-25 DIAGNOSIS — Z7901 Long term (current) use of anticoagulants: Secondary | ICD-10-CM

## 2013-09-25 LAB — POCT INR: INR: 2.5

## 2013-09-25 NOTE — Assessment & Plan Note (Signed)
Blood pressure running borderline low. Possibly since the addition of his prostate medications. We have recommended he monitor his blood pressure at home. If this runs low, we would cut the triamterene HCTZ pill in half, even hold the pill if needed

## 2013-09-25 NOTE — Assessment & Plan Note (Signed)
Currently not on a statin. We'll discuss his numbers with him on his next visit

## 2013-09-25 NOTE — Progress Notes (Signed)
Patient ID: Michael Colon, male    DOB: [birth year], 80 y.o.   MRN: [id number]  HPI Comments: Michael Colon is a very pleasant 80 year old gentleman with history of atrial flutter (3/14), previous ablation , on warfarin, hypertension, borderline diabetes, Obstructive sleep apnea who presented to [facility] on August 04, 2010 with chest pain. Symptoms started after eating and it was felt he had GI spasm or hiatal hernia. Mild improvement in symptoms with nitroglycerin and GI cocktail in the emergency room. He is very active at baseline. Noted to be in atrial flutter March 2014. He reports having a TIA some time in 2014, workup negative at [facility]   In followup today, he reports that he has either urinary tract infection or prostatitis. Some he started on Levaquin which has made him feel mildly dizzy. Also started on various prostate medications including Flomax and Proscar. He has not been monitoring his blood pressure at home. Systolic pressure 818 in a sitting position on today's visit Otherwise he feels well with no complaints No near syncope or syncope Previously evaluated for bradycardia. Pacemaker was not recommended at that time. Previously seen by Dr. Caryl Comes, EP  Previous Holter monitor showed normal sinus rhythm with pauses up to 2.86 seconds, bradycardia with heart rates in the 20s to 30s at nighttime, 30s to 50 during the daytime.   Hemoglobin A1c in December 2014 7.5, total cholesterol up from 132 now 200  EKG today shows normal sinus rhythm with rate 50 beats per minute, right bundle branch block   Outpatient Encounter Prescriptions as of 09/25/2013  Medication Sig  . docusate sodium (COLACE) 100 MG capsule Take 100 mg by mouth 2 (two) times daily.   . famotidine (PEPCID) 20 MG tablet Take 20 mg by mouth 1 dose over 24 hours.   . finasteride (PROSCAR) 5 MG tablet TAKE ONE TABLET BY MOUTH ONCE DAILY  . fish oil-omega-3 fatty acids 1000 MG capsule Take 1 g by mouth daily.   . Glucosamine  Sulfate-MSM (GLUCOSAMINE-MSM DS) 500-500 MG TABS Take 1,500 mg/day by mouth daily.  Marland Kitchen glucose blood test strip Use as instructed to test blood sugar once daily dx: 250.00  . levofloxacin (LEVAQUIN) 500 MG tablet Take 500 mg by mouth daily.   Marland Kitchen loratadine (CLARITIN) 10 MG tablet Take 10 mg by mouth daily.    Marland Kitchen MICROLET LANCETS MISC Use to test twice daily or as directed  . Multiple Vitamins-Minerals (CENTRUM SILVER PO) Take by mouth daily after breakfast.    . nitroGLYCERIN (NITROSTAT) 0.4 MG SL tablet Place 0.4 mg under the tongue every 5 (five) minutes as needed for chest pain.  . tamsulosin (FLOMAX) 0.4 MG CAPS capsule Take 1 capsule (0.4 mg total) by mouth daily.  Marland Kitchen triamterene-hydrochlorothiazide (DYAZIDE) 37.5-25 MG per capsule Take 1 each (1 capsule total) by mouth every morning.  . warfarin (COUMADIN) 5 MG tablet TAKE AS DIRECTED BY ANTICOAGULATION CLINIC   Review of Systems  Constitutional: Negative.   HENT: Negative.   Eyes: Negative.   Respiratory: Negative.   Cardiovascular: Negative.   Gastrointestinal: Negative.   Endocrine: Negative.   Musculoskeletal: Negative.   Skin: Negative.   Allergic/Immunologic: Negative.   Neurological: Positive for dizziness.  Hematological: Negative.   Psychiatric/Behavioral: Negative.   All other systems reviewed and are negative.   BP 116/70  Pulse 50  Ht 5\' 11"  (1.803 m)  Wt 236 lb 8 oz (107.276 kg)  BMI 33.00 kg/m2  Physical Exam  Nursing note and vitals reviewed.  Constitutional: He is oriented to person, place, and time. He appears well-developed and well-nourished.  HENT:  Head: Normocephalic.  Nose: Nose normal.  Mouth/Throat: Oropharynx is clear and moist.  Eyes: Conjunctivae are normal. Pupils are equal, round, and reactive to light.  Neck: Normal range of motion. Neck supple. No JVD present.  Cardiovascular: Normal rate, regular rhythm, S1 normal, S2 normal, normal heart sounds and intact distal pulses.  Exam reveals no  gallop and no friction rub.   No murmur heard. Trace edema above the sock line  Pulmonary/Chest: Effort normal and breath sounds normal. No respiratory distress. He has no wheezes. He has no rales. He exhibits no tenderness.  Abdominal: Soft. Bowel sounds are normal. He exhibits no distension. There is no tenderness.  Musculoskeletal: Normal range of motion. He exhibits edema. He exhibits no tenderness.  Lymphadenopathy:    He has no cervical adenopathy.  Neurological: He is alert and oriented to person, place, and time. Coordination normal.  Skin: Skin is warm and dry. No rash noted. No erythema.  Psychiatric: He has a normal mood and affect. His behavior is normal. Judgment and thought content normal.      Assessment and Plan

## 2013-09-25 NOTE — Assessment & Plan Note (Signed)
He continues to have asymptomatic bradycardia. We'll continue to monitor. Overall is asymptomatic

## 2013-09-25 NOTE — Assessment & Plan Note (Signed)
Minimal leg edema, likely from venous insufficiency. Recommended  compression hose to his knees

## 2013-09-25 NOTE — Patient Instructions (Signed)
You are doing well. No medication changes were made.  If blood pressure runs low, Please cut the triamtere HCTZ in 1/2 daily or hold the medication  Please call us if you have new issues that need to be addressed before your next appt.  Your physician wants you to follow-up in: 6 months.  You will receive a reminder letter in the mail two months in advance. If you don't receive a letter, please call our office to schedule the follow-up appointment.

## 2013-09-25 NOTE — Assessment & Plan Note (Signed)
Maintaining normal sinus rhythm. No changes to his medications. 

## 2013-09-25 NOTE — Assessment & Plan Note (Signed)
We have encouraged continued exercise, careful diet management in an effort to lose weight. 

## 2013-10-02 ENCOUNTER — Ambulatory Visit (INDEPENDENT_AMBULATORY_CARE_PROVIDER_SITE_OTHER): Payer: Medicare Other

## 2013-10-02 DIAGNOSIS — Z5181 Encounter for therapeutic drug level monitoring: Secondary | ICD-10-CM | POA: Diagnosis not present

## 2013-10-02 DIAGNOSIS — I4892 Unspecified atrial flutter: Secondary | ICD-10-CM | POA: Diagnosis not present

## 2013-10-02 DIAGNOSIS — Z7901 Long term (current) use of anticoagulants: Secondary | ICD-10-CM | POA: Diagnosis not present

## 2013-10-02 LAB — POCT INR: INR: 2.6

## 2013-10-07 DIAGNOSIS — B351 Tinea unguium: Secondary | ICD-10-CM | POA: Diagnosis not present

## 2013-10-07 DIAGNOSIS — J45909 Unspecified asthma, uncomplicated: Secondary | ICD-10-CM | POA: Insufficient documentation

## 2013-10-07 DIAGNOSIS — L851 Acquired keratosis [keratoderma] palmaris et plantaris: Secondary | ICD-10-CM | POA: Diagnosis not present

## 2013-10-07 DIAGNOSIS — E1142 Type 2 diabetes mellitus with diabetic polyneuropathy: Secondary | ICD-10-CM | POA: Diagnosis not present

## 2013-10-14 ENCOUNTER — Other Ambulatory Visit: Payer: Self-pay | Admitting: Cardiovascular Disease

## 2013-10-16 ENCOUNTER — Ambulatory Visit (INDEPENDENT_AMBULATORY_CARE_PROVIDER_SITE_OTHER): Payer: Medicare Other

## 2013-10-16 DIAGNOSIS — Z5181 Encounter for therapeutic drug level monitoring: Secondary | ICD-10-CM

## 2013-10-16 DIAGNOSIS — Z7901 Long term (current) use of anticoagulants: Secondary | ICD-10-CM | POA: Diagnosis not present

## 2013-10-16 DIAGNOSIS — I4892 Unspecified atrial flutter: Secondary | ICD-10-CM

## 2013-10-16 LAB — POCT INR: INR: 2.2

## 2013-11-06 ENCOUNTER — Ambulatory Visit (INDEPENDENT_AMBULATORY_CARE_PROVIDER_SITE_OTHER): Payer: Medicare Other

## 2013-11-06 DIAGNOSIS — I4892 Unspecified atrial flutter: Secondary | ICD-10-CM | POA: Diagnosis not present

## 2013-11-06 DIAGNOSIS — Z7901 Long term (current) use of anticoagulants: Secondary | ICD-10-CM | POA: Diagnosis not present

## 2013-11-06 DIAGNOSIS — Z5181 Encounter for therapeutic drug level monitoring: Secondary | ICD-10-CM | POA: Diagnosis not present

## 2013-11-06 LAB — POCT INR: INR: 2.2

## 2013-11-07 DIAGNOSIS — H18423 Band keratopathy, bilateral: Secondary | ICD-10-CM | POA: Diagnosis not present

## 2013-11-18 ENCOUNTER — Other Ambulatory Visit: Payer: Self-pay | Admitting: *Deleted

## 2013-11-18 MED ORDER — WARFARIN SODIUM 5 MG PO TABS: 5.0000 mg | ORAL_TABLET | Freq: Every day | ORAL | Status: DC

## 2013-11-18 NOTE — Telephone Encounter (Signed)
Please review for refill. Thanks!  

## 2013-11-18 NOTE — Telephone Encounter (Signed)
Taking 7.5 mg a day so please change order. Patient completely out. Stat!

## 2013-11-18 NOTE — Telephone Encounter (Signed)
Please review for refill, Thank you. 

## 2013-12-04 ENCOUNTER — Ambulatory Visit (INDEPENDENT_AMBULATORY_CARE_PROVIDER_SITE_OTHER): Payer: Medicare Other

## 2013-12-04 DIAGNOSIS — I4892 Unspecified atrial flutter: Secondary | ICD-10-CM | POA: Diagnosis not present

## 2013-12-04 DIAGNOSIS — Z5181 Encounter for therapeutic drug level monitoring: Secondary | ICD-10-CM

## 2013-12-04 DIAGNOSIS — Z7901 Long term (current) use of anticoagulants: Secondary | ICD-10-CM

## 2013-12-04 LAB — POCT INR: INR: 2.8

## 2013-12-09 DIAGNOSIS — L259 Unspecified contact dermatitis, unspecified cause: Secondary | ICD-10-CM | POA: Diagnosis not present

## 2013-12-09 DIAGNOSIS — Z85828 Personal history of other malignant neoplasm of skin: Secondary | ICD-10-CM | POA: Diagnosis not present

## 2013-12-09 DIAGNOSIS — X32XXXA Exposure to sunlight, initial encounter: Secondary | ICD-10-CM | POA: Diagnosis not present

## 2013-12-09 DIAGNOSIS — L570 Actinic keratosis: Secondary | ICD-10-CM | POA: Diagnosis not present

## 2013-12-10 DIAGNOSIS — B351 Tinea unguium: Secondary | ICD-10-CM | POA: Diagnosis not present

## 2013-12-10 DIAGNOSIS — L851 Acquired keratosis [keratoderma] palmaris et plantaris: Secondary | ICD-10-CM | POA: Diagnosis not present

## 2013-12-10 DIAGNOSIS — E1142 Type 2 diabetes mellitus with diabetic polyneuropathy: Secondary | ICD-10-CM | POA: Diagnosis not present

## 2013-12-13 ENCOUNTER — Ambulatory Visit (INDEPENDENT_AMBULATORY_CARE_PROVIDER_SITE_OTHER): Payer: Medicare Other | Admitting: Internal Medicine

## 2013-12-13 ENCOUNTER — Encounter: Payer: Self-pay | Admitting: Internal Medicine

## 2013-12-13 VITALS — BP 140/80 | HR 78 | Temp 98.6°F | Ht 71.0 in | Wt 237.0 lb

## 2013-12-13 DIAGNOSIS — Z0000 Encounter for general adult medical examination without abnormal findings: Secondary | ICD-10-CM

## 2013-12-13 DIAGNOSIS — I10 Essential (primary) hypertension: Secondary | ICD-10-CM | POA: Diagnosis not present

## 2013-12-13 DIAGNOSIS — Z7189 Other specified counseling: Secondary | ICD-10-CM | POA: Diagnosis not present

## 2013-12-13 DIAGNOSIS — N411 Chronic prostatitis: Secondary | ICD-10-CM | POA: Diagnosis not present

## 2013-12-13 DIAGNOSIS — E785 Hyperlipidemia, unspecified: Secondary | ICD-10-CM

## 2013-12-13 DIAGNOSIS — E119 Type 2 diabetes mellitus without complications: Secondary | ICD-10-CM | POA: Diagnosis not present

## 2013-12-13 DIAGNOSIS — K219 Gastro-esophageal reflux disease without esophagitis: Secondary | ICD-10-CM

## 2013-12-13 DIAGNOSIS — I4892 Unspecified atrial flutter: Secondary | ICD-10-CM

## 2013-12-13 LAB — COMPREHENSIVE METABOLIC PANEL
ALT: 18 U/L (ref 0–53)
AST: 21 U/L (ref 0–37)
Albumin: 3.7 g/dL (ref 3.5–5.2)
Alkaline Phosphatase: 62 U/L (ref 39–117)
BUN: 23 mg/dL (ref 6–23)
CO2: 28 mEq/L (ref 19–32)
Calcium: 10.5 mg/dL (ref 8.4–10.5)
Chloride: 102 mEq/L (ref 96–112)
Creatinine, Ser: 0.8 mg/dL (ref 0.4–1.5)
GFR: 106.21 mL/min (ref >60.00)
Glucose, Bld: 163 mg/dL — ABNORMAL HIGH (ref 70–99)
Potassium: 4.1 mEq/L (ref 3.5–5.1)
Sodium: 134 mEq/L — ABNORMAL LOW (ref 135–145)
Total Bilirubin: 0.7 mg/dL (ref 0.2–1.2)
Total Protein: 6.6 g/dL (ref 6.0–8.3)

## 2013-12-13 LAB — CBC WITH DIFFERENTIAL/PLATELET
Basophils Absolute: 0 10*3/uL (ref 0.0–0.1)
Basophils Relative: 0.5 % (ref 0.0–3.0)
Eosinophils Absolute: 0.6 10*3/uL (ref 0.0–0.7)
Eosinophils Relative: 8.2 % — ABNORMAL HIGH (ref 0.0–5.0)
HCT: 49.2 % (ref 39.0–52.0)
Hemoglobin: 16.5 g/dL (ref 13.0–17.0)
Lymphocytes Relative: 21.1 % (ref 12.0–46.0)
Lymphs Abs: 1.5 10*3/uL (ref 0.7–4.0)
MCHC: 33.4 g/dL (ref 30.0–36.0)
MCV: 91.8 fl (ref 78.0–100.0)
Monocytes Absolute: 0.8 10*3/uL (ref 0.1–1.0)
Monocytes Relative: 10.8 % (ref 3.0–12.0)
Neutro Abs: 4.3 10*3/uL (ref 1.4–7.7)
Neutrophils Relative %: 59.4 % (ref 43.0–77.0)
Platelets: 131 10*3/uL — ABNORMAL LOW (ref 150.0–400.0)
RBC: 5.37 Mil/uL (ref 4.22–5.81)
RDW: 12.8 % (ref 11.5–15.5)
WBC: 7.2 10*3/uL (ref 4.0–10.5)

## 2013-12-13 LAB — HM DIABETES FOOT EXAM

## 2013-12-13 LAB — T4, FREE: Free T4: 1.04 ng/dL (ref 0.60–1.60)

## 2013-12-13 LAB — LIPID PANEL
Cholesterol: 206 mg/dL — ABNORMAL HIGH (ref 0–200)
HDL: 56.6 mg/dL (ref >39.00)
LDL Cholesterol: 118 mg/dL — ABNORMAL HIGH (ref 0–99)
NonHDL: 149.4
Total CHOL/HDL Ratio: 4
Triglycerides: 155 mg/dL — ABNORMAL HIGH (ref 0.0–149.0)
VLDL: 31 mg/dL (ref 0.0–40.0)

## 2013-12-13 LAB — HEMOGLOBIN A1C: Hgb A1c MFr Bld: 7.2 % — ABNORMAL HIGH (ref 4.6–6.5)

## 2013-12-13 NOTE — Assessment & Plan Note (Signed)
Controlled on his dual therapy No change needed

## 2013-12-13 NOTE — Assessment & Plan Note (Signed)
Doing fine with daily H2 blocker

## 2013-12-13 NOTE — Assessment & Plan Note (Signed)
I have personally reviewed the Medicare Annual Wellness questionnaire and have noted 1. The patient's medical and social history 2. Their use of alcohol, tobacco or illicit drugs 3. Their current medications and supplements 4. The patient's functional ability including ADL's, fall risks, home safety risks and hearing or visual             impairment. 5. Diet and physical activities 6. Evidence for depression or mood disorders  The patients weight, height, BMI and visual acuity have been recorded in the chart I have made referrals, counseling and provided education to the patient based review of the above and I have provided the pt with a written personalized care plan for preventive services.  I have provided you with a copy of your personalized plan for preventive services. Please take the time to review along with your updated medication list.  No new concerns UTD on immunizations No cancer screening due to age Discussed fitness

## 2013-12-13 NOTE — Assessment & Plan Note (Signed)
BP Readings from Last 3 Encounters:  12/13/13 140/80  09/25/13 116/70  09/19/13 120/80   Adequate control

## 2013-12-13 NOTE — Progress Notes (Signed)
Pre visit review using our clinic review tool, if applicable. No additional management support is needed unless otherwise documented below in the visit note. 

## 2013-12-13 NOTE — Assessment & Plan Note (Signed)
Discussed statin  He prefers not This is probably acceptable at his age

## 2013-12-13 NOTE — Progress Notes (Signed)
Subjective:    Patient ID: Michael Colon, male    DOB: 03-08-1930, 80 y.o.   MRN: [id number]  HPI Here for Medicare wellness and follow up of diabetes and other chronic medical issues Reviewed form and advanced directives Reviewed other physicians No falls No depression or anhedonia No alcohol or tobacco Tries to exercise regularly Hearing is poor--no hearing aides (can't wear them) Vision is okay--keeps up with exams  Independent in instrumental ADLs No sig cognitive problems  He feels pretty well Did catch a cold on 11/10 Bad runny nose Still with bad cough and chest congestion Doesn't feel sick At most low grade fever---usually 97 and now up some from that Feels "draggy" Taking mucinex and keeps the mucus loose  Checks sugars every 4-5 days 95-125 No hypoglycemic reactions UTD with eye exam No sores or pain in feet  Heart has been fine Pulse is steady---no recent palpitations No recent severe bradycardia No chest pain No regular SOB No dizziness or syncope  Prostate seems quiet On dual therapy Stream still dwindles at the end Nocturia x 1 at most Does get some urgency though  Takes the pepcid every night No regular heartburn No swallowing problems  Current Outpatient Prescriptions on File Prior to Visit  Medication Sig Dispense Refill  . docusate sodium (COLACE) 100 MG capsule Take 100 mg by mouth 2 (two) times daily.     . famotidine (PEPCID) 20 MG tablet Take 20 mg by mouth 1 dose over 24 hours.     . finasteride (PROSCAR) 5 MG tablet TAKE ONE TABLET BY MOUTH ONCE DAILY 90 tablet 0  . fish oil-omega-3 fatty acids 1000 MG capsule Take 1 g by mouth daily.     . Glucosamine Sulfate-MSM (GLUCOSAMINE-MSM DS) 500-500 MG TABS Take 1,500 mg/day by mouth daily.    Marland Kitchen glucose blood test strip Use as instructed to test blood sugar once daily dx: 250.00    . loratadine (CLARITIN) 10 MG tablet Take 10 mg by mouth daily.      Marland Kitchen MICROLET LANCETS MISC Use to  test twice daily or as directed 100 each 3  . Multiple Vitamins-Minerals (CENTRUM SILVER PO) Take by mouth daily after breakfast.      . nitroGLYCERIN (NITROSTAT) 0.4 MG SL tablet Place 0.4 mg under the tongue every 5 (five) minutes as needed for chest pain.    . tamsulosin (FLOMAX) 0.4 MG CAPS capsule Take 1 capsule (0.4 mg total) by mouth daily. 90 capsule 3  . triamterene-hydrochlorothiazide (DYAZIDE) 37.5-25 MG per capsule TAKE ONE CAPSULE BY MOUTH IN THE MORNING 90 capsule 3  . warfarin (COUMADIN) 5 MG tablet Take 1-1.5 tablets (5-7.5 mg total) by mouth daily. As directed 135 tablet 1   No current facility-administered medications on file prior to visit.    Allergies  Allergen Reactions  . Enoxaparin Sodium Hives  . Latex Rash  . Percocet [Oxycodone-Acetaminophen] Rash    Past Medical History  Diagnosis Date  . Chronic prostatitis   . Obstructive sleep apnea     CPAP-9  . Chronic ear infection   . Asthma   . GERD (gastroesophageal reflux disease)     laryngeal involvement  . Hypertension   . Osteoarthrosis, localized, primary, knee     post-traumatic  . Diabetes mellitus   . Tachy-brady syndrome   . Atrial flutter 2007  . Benign prostatic hypertrophy   . Hyperlipidemia   . Band keratopathy   . UTI (urinary tract infection)   .  Prostatitis     Past Surgical History  Procedure Laterality Date  . Mastoidectomy  8/08    Dr Idelle Crouch  . Rhinoplasty  5/10    and septoplasty  . Cataract extraction, bilateral  2009  . Belpharoptosis repair      Dr [name]---didn't resolve weepy eye and eyelid drooping  . Knee surgery  1998    plate after fracture, then removed for infection  . Cardioversion  04/13/2010  . Chest pain  8/12    Stress test benign  . Appendectomy    . Left elbow surgery    . Venous ablation    . Tear duct probing  11/13    Dr Vickki Muff  . Subacromial decompression Right 2005    Arthroscopic (for rotator cuff and biceps tendon ruptures)    Family  History  Problem Relation Age of Onset  . Other Mother     natural causes  . Colon cancer Father   . Diabetes Father     History   Social History  . Marital Status: Married    Spouse Name: N/A    Number of Children: 2  . 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 Smoking status: Former Smoker -- 1.00 packs/day for 0 years    Types: Cigarettes    Quit date: 01/03/1969  . Smokeless tobacco: Never Used  . Alcohol Use: No     Comment: rare wine  . Drug Use: No  . Sexual Activity: Not on file   Other Topics Concern  . Not on file   Social History Narrative   Has living will   Health care POA-- wife and then son Shanon Brow   Has DNR order from the past---form redone 06/25/10   Probably no feeding tube if cognitively unaware   Review of Systems Had some AKs burned off face by dermatologist No problems sleeping Bowels are good Regular with dentist--recent crown, implant and gum work    Objective:   Physical Exam  Constitutional: He is oriented to person, place, and time. He appears well-developed and well-nourished. No distress.  HENT:  Head: Normocephalic and atraumatic.  Right Ear: External ear normal.  Left Ear: External ear normal.  Mouth/Throat: Oropharynx is clear and moist. No oropharyngeal exudate.  Eyes: Conjunctivae and EOM are normal. Pupils are equal, round, and reactive to light.  Neck: Normal range of motion. Neck supple. No thyromegaly present.  Cardiovascular: Normal rate, regular rhythm, normal heart sounds and intact distal pulses.  Exam reveals no gallop.   No murmur heard. Pulmonary/Chest: Effort normal and breath sounds normal. No respiratory distress. He has no wheezes. He has no rales.  Abdominal: Soft. There is no tenderness.  Musculoskeletal: He exhibits no edema or tenderness.  Lymphadenopathy:    He has cervical adenopathy.  Neurological: He is alert and oriented to person,  place, and time.  President-- "Obama, Dub (Bush), Clinton" [phone] [id number] Recall 3/3  Skin: No rash noted. No erythema.  No foot lesions  Psychiatric: He has a normal mood and affect. His behavior is normal.          Assessment & Plan:

## 2013-12-13 NOTE — Assessment & Plan Note (Signed)
Controlled Remains on warfarin

## 2013-12-13 NOTE — Assessment & Plan Note (Signed)
Still seems to have good control without meds Due for A1c

## 2013-12-13 NOTE — Assessment & Plan Note (Signed)
See social history 

## 2013-12-16 ENCOUNTER — Other Ambulatory Visit: Payer: Self-pay | Admitting: Internal Medicine

## 2014-01-08 ENCOUNTER — Ambulatory Visit (INDEPENDENT_AMBULATORY_CARE_PROVIDER_SITE_OTHER): Payer: Medicare Other

## 2014-01-08 DIAGNOSIS — I4892 Unspecified atrial flutter: Secondary | ICD-10-CM | POA: Diagnosis not present

## 2014-01-08 DIAGNOSIS — Z7901 Long term (current) use of anticoagulants: Secondary | ICD-10-CM

## 2014-01-08 DIAGNOSIS — Z5181 Encounter for therapeutic drug level monitoring: Secondary | ICD-10-CM | POA: Diagnosis not present

## 2014-01-08 LAB — POCT INR: INR: 2.1

## 2014-01-23 ENCOUNTER — Other Ambulatory Visit: Payer: Self-pay | Admitting: Radiology

## 2014-01-23 DIAGNOSIS — I6523 Occlusion and stenosis of bilateral carotid arteries: Secondary | ICD-10-CM

## 2014-02-19 ENCOUNTER — Ambulatory Visit (INDEPENDENT_AMBULATORY_CARE_PROVIDER_SITE_OTHER): Payer: Medicare Other

## 2014-02-19 DIAGNOSIS — Z7901 Long term (current) use of anticoagulants: Secondary | ICD-10-CM

## 2014-02-19 DIAGNOSIS — I4892 Unspecified atrial flutter: Secondary | ICD-10-CM | POA: Diagnosis not present

## 2014-02-19 DIAGNOSIS — Z5181 Encounter for therapeutic drug level monitoring: Secondary | ICD-10-CM

## 2014-02-19 LAB — POCT INR: INR: 2.5

## 2014-02-25 ENCOUNTER — Other Ambulatory Visit: Payer: Self-pay | Admitting: Internal Medicine

## 2014-02-25 NOTE — Telephone Encounter (Signed)
Received refill request electronically from pharmacy. Directions from pharmacy shows check blood sugar twice a day and chart shows once daily. Please confirm correct directions?

## 2014-02-26 NOTE — Telephone Encounter (Signed)
Should just be once a day

## 2014-03-04 DIAGNOSIS — E1142 Type 2 diabetes mellitus with diabetic polyneuropathy: Secondary | ICD-10-CM | POA: Diagnosis not present

## 2014-03-04 DIAGNOSIS — L851 Acquired keratosis [keratoderma] palmaris et plantaris: Secondary | ICD-10-CM | POA: Diagnosis not present

## 2014-03-04 DIAGNOSIS — B351 Tinea unguium: Secondary | ICD-10-CM | POA: Diagnosis not present

## 2014-03-17 ENCOUNTER — Other Ambulatory Visit: Payer: Self-pay | Admitting: Internal Medicine

## 2014-03-27 ENCOUNTER — Ambulatory Visit (INDEPENDENT_AMBULATORY_CARE_PROVIDER_SITE_OTHER): Payer: Medicare Other | Admitting: *Deleted

## 2014-03-27 ENCOUNTER — Ambulatory Visit (INDEPENDENT_AMBULATORY_CARE_PROVIDER_SITE_OTHER): Payer: Medicare Other | Admitting: Cardiovascular Disease

## 2014-03-27 ENCOUNTER — Encounter: Payer: Self-pay | Admitting: Cardiovascular Disease

## 2014-03-27 VITALS — BP 126/82 | HR 69 | Ht 71.0 in | Wt 238.8 lb

## 2014-03-27 DIAGNOSIS — Z5181 Encounter for therapeutic drug level monitoring: Secondary | ICD-10-CM

## 2014-03-27 DIAGNOSIS — R600 Localized edema: Secondary | ICD-10-CM

## 2014-03-27 DIAGNOSIS — I6523 Occlusion and stenosis of bilateral carotid arteries: Secondary | ICD-10-CM | POA: Diagnosis not present

## 2014-03-27 DIAGNOSIS — Z7901 Long term (current) use of anticoagulants: Secondary | ICD-10-CM | POA: Diagnosis not present

## 2014-03-27 DIAGNOSIS — E785 Hyperlipidemia, unspecified: Secondary | ICD-10-CM

## 2014-03-27 DIAGNOSIS — I4892 Unspecified atrial flutter: Secondary | ICD-10-CM | POA: Diagnosis not present

## 2014-03-27 DIAGNOSIS — I10 Essential (primary) hypertension: Secondary | ICD-10-CM | POA: Diagnosis not present

## 2014-03-27 DIAGNOSIS — Z7189 Other specified counseling: Secondary | ICD-10-CM

## 2014-03-27 DIAGNOSIS — E119 Type 2 diabetes mellitus without complications: Secondary | ICD-10-CM

## 2014-03-27 LAB — POCT INR: INR: 3.5

## 2014-03-27 NOTE — Assessment & Plan Note (Signed)
Cholesterol level discussed with him. Seems to range between 190 and 200. Recommended weight loss

## 2014-03-27 NOTE — Assessment & Plan Note (Signed)
INR 3.5 today. Warfarin adjusted

## 2014-03-27 NOTE — Assessment & Plan Note (Signed)
Ventricular rate well controlled. Asymptomatic. We'll continue warfarin this was adjusted today

## 2014-03-27 NOTE — Progress Notes (Signed)
Patient ID: Michael Colon, male    DOB: May 17, 1930, 81 y.o.   MRN: [id number]  HPI Comments: Michael Colon is a very pleasant 81 year old gentleman with history of atrial flutter (3/14), previous ablation , on warfarin, hypertension, borderline diabetes, Obstructive sleep apnea who presented to [facility] on August 04, 2010 with chest pain. Symptoms started after eating and it was felt he had GI spasm or hiatal hernia. Mild improvement in symptoms with nitroglycerin and GI cocktail in the emergency room. He is very active at baseline. Noted to be in atrial flutter March 2014. He reports having a TIA some time in 2014, workup negative at [facility]  In followup today, he reports that he is doing well. He is taking care of his wife who is recovering after surgery. She is nonweightbearing. He reports blood pressure has been well controlled at home. Denies any tachycardia, shortness of breath. His biggest complaint is leg swelling bilaterally, possibly worse on the left. Previous vein ablation surgery at [facility] He has difficulty tolerating compression hose as after little time they feel very tight and uncomfortable He continues to have chronic knee pain, worse on the left  EKG on today's visit shows what appears to be slow atrial flutter with ventricular rate 69 bpm, right bundle branch block  Other past medical history Previously evaluated for bradycardia. Pacemaker was not recommended at that time. Previously seen by Dr. Caryl Comes, EP Previous Holter monitor showed normal sinus rhythm with pauses up to 2.86 seconds, bradycardia with heart rates in the 20s to 30s at nighttime, 30s to 50 during the daytime.  Hemoglobin A1c in December 2014 7.5, total cholesterol up from 132 now 200   Allergies  Allergen Reactions  . Enoxaparin Sodium Hives  . Latex Rash  . Percocet [Oxycodone-Acetaminophen] Rash    Outpatient Encounter Prescriptions as of 03/27/2014  Medication Sig  . BAYER CONTOUR TEST test strip USE STRIP  AS INSTRUCTED TO TEST BLOOD SUGAR TWICE DAILY  . docusate sodium (COLACE) 100 MG capsule Take 100 mg by mouth 2 (two) times daily.   . famotidine (PEPCID) 20 MG tablet Take 20 mg by mouth 1 dose over 24 hours.   . finasteride (PROSCAR) 5 MG tablet TAKE ONE TABLET BY MOUTH ONCE DAILY  . fish oil-omega-3 fatty acids 1000 MG capsule Take 1 g by mouth daily.   . Glucosamine Sulfate-MSM (GLUCOSAMINE-MSM DS) 500-500 MG TABS Take 1,500 mg/day by mouth daily.  Marland Kitchen glucose blood test strip Use as instructed to test blood sugar once daily dx: 250.00  . ibuprofen (ADVIL,MOTRIN) 200 MG tablet Take 400 mg by mouth 2 (two) times daily.  Marland Kitchen loratadine (CLARITIN) 10 MG tablet Take 10 mg by mouth daily.    Marland Kitchen MICROLET LANCETS MISC Use to test twice daily or as directed  . Multiple Vitamins-Minerals (CENTRUM SILVER PO) Take by mouth daily after breakfast.    . nitroGLYCERIN (NITROSTAT) 0.4 MG SL tablet Place 0.4 mg under the tongue every 5 (five) minutes as needed for chest pain.  . tamsulosin (FLOMAX) 0.4 MG CAPS capsule Take 1 capsule (0.4 mg total) by mouth daily.  Marland Kitchen triamterene-hydrochlorothiazide (DYAZIDE) 37.5-25 MG per capsule TAKE ONE CAPSULE BY MOUTH IN THE MORNING  . warfarin (COUMADIN) 5 MG tablet Take 1-1.5 tablets (5-7.5 mg total) by mouth daily. As directed    Past Medical History  Diagnosis Date  . Chronic prostatitis   . Obstructive sleep apnea     CPAP-9  . Chronic ear infection   .  Asthma   . GERD (gastroesophageal reflux disease)     laryngeal involvement  . Hypertension   . Osteoarthrosis, localized, primary, knee     post-traumatic  . Diabetes mellitus   . Tachy-brady syndrome   . Atrial flutter 2007  . Benign prostatic hypertrophy   . Hyperlipidemia   . Band keratopathy   . UTI (urinary tract infection)   . Prostatitis     Past Surgical History  Procedure Laterality Date  . Mastoidectomy  8/08    Dr Idelle Crouch  . Rhinoplasty  5/10    and septoplasty  . Cataract extraction,  bilateral  2009  . Belpharoptosis repair      Dr [name]---didn't resolve weepy eye and eyelid drooping  . Knee surgery  1998    plate after fracture, then removed for infection  . Cardioversion  04/13/2010  . Chest pain  8/12    Stress test benign  . Appendectomy    . Left elbow surgery    . Venous ablation    . Tear duct probing  11/13    Dr Vickki Muff  . Subacromial decompression Right 2005    Arthroscopic (for rotator cuff and biceps tendon ruptures)    Social History  reports that he quit smoking about 45 years ago. His smoking use included Cigarettes. He smoked 1.00 pack per day for 0 years. He has never used smokeless tobacco. He reports that he does not drink alcohol or use illicit drugs.  Family History family history includes Colon cancer in his father; Diabetes in his father; Other in his mother.       Review of Systems  Constitutional: Negative.   Respiratory: Negative.   Cardiovascular: Positive for leg swelling.  Gastrointestinal: Negative.   Musculoskeletal: Positive for arthralgias.  Skin: Negative.   Allergic/Immunologic: Negative.   Neurological: Negative.   Hematological: Negative.   Psychiatric/Behavioral: Negative.   All other systems reviewed and are negative.   BP 126/82 mmHg  Pulse 69  Ht 5\' 11"  (1.803 m)  Wt 238 lb 12 oz (108.296 kg)  BMI 33.31 kg/m2  Physical Exam  Constitutional: He is oriented to person, place, and time. He appears well-developed and well-nourished.  HENT:  Head: Normocephalic.  Nose: Nose normal.  Mouth/Throat: Oropharynx is clear and moist.  Eyes: Conjunctivae are normal. Pupils are equal, round, and reactive to light.  Neck: Normal range of motion. Neck supple. No JVD present.  Cardiovascular: Normal rate, regular rhythm, S1 normal, S2 normal, normal heart sounds and intact distal pulses.  Exam reveals no gallop and no friction rub.   No murmur heard. Trace edema above the sock line  Pulmonary/Chest: Effort normal and  breath sounds normal. No respiratory distress. He has no wheezes. He has no rales. He exhibits no tenderness.  Abdominal: Soft. Bowel sounds are normal. He exhibits no distension. There is no tenderness.  Musculoskeletal: Normal range of motion. He exhibits edema. He exhibits no tenderness.  Lymphadenopathy:    He has no cervical adenopathy.  Neurological: He is alert and oriented to person, place, and time. Coordination normal.  Skin: Skin is warm and dry. No rash noted. No erythema.  Psychiatric: He has a normal mood and affect. His behavior is normal. Judgment and thought content normal.      Assessment and Plan   Nursing note and vitals reviewed.

## 2014-03-27 NOTE — Assessment & Plan Note (Signed)
Leg swelling secondary to venous reflux, previous trauma. Recommended compression hose if tolerated, leg elevation

## 2014-03-27 NOTE — Assessment & Plan Note (Signed)
Hemoglobin A1c 7.2. Encouraged dietary changes, weight loss

## 2014-03-27 NOTE — Patient Instructions (Signed)
You are doing well. No medication changes were made.  Please call us if you have new issues that need to be addressed before your next appt.  Your physician wants you to follow-up in: 6 months.  You will receive a reminder letter in the mail two months in advance. If you don't receive a letter, please call our office to schedule the follow-up appointment.   

## 2014-03-27 NOTE — Assessment & Plan Note (Signed)
Blood pressure is well controlled on today's visit. No changes made to the medications. 

## 2014-04-18 ENCOUNTER — Ambulatory Visit (INDEPENDENT_AMBULATORY_CARE_PROVIDER_SITE_OTHER): Payer: Medicare Other | Admitting: *Deleted

## 2014-04-18 DIAGNOSIS — Z7901 Long term (current) use of anticoagulants: Secondary | ICD-10-CM

## 2014-04-18 DIAGNOSIS — I4892 Unspecified atrial flutter: Secondary | ICD-10-CM

## 2014-04-18 DIAGNOSIS — Z5181 Encounter for therapeutic drug level monitoring: Secondary | ICD-10-CM

## 2014-04-18 LAB — POCT INR: INR: 2.3

## 2014-05-07 DIAGNOSIS — L851 Acquired keratosis [keratoderma] palmaris et plantaris: Secondary | ICD-10-CM | POA: Diagnosis not present

## 2014-05-07 DIAGNOSIS — H18423 Band keratopathy, bilateral: Secondary | ICD-10-CM | POA: Diagnosis not present

## 2014-05-07 DIAGNOSIS — B351 Tinea unguium: Secondary | ICD-10-CM | POA: Diagnosis not present

## 2014-05-07 DIAGNOSIS — E1142 Type 2 diabetes mellitus with diabetic polyneuropathy: Secondary | ICD-10-CM | POA: Diagnosis not present

## 2014-05-14 ENCOUNTER — Ambulatory Visit (INDEPENDENT_AMBULATORY_CARE_PROVIDER_SITE_OTHER): Payer: Medicare Other

## 2014-05-14 DIAGNOSIS — Z5181 Encounter for therapeutic drug level monitoring: Secondary | ICD-10-CM | POA: Diagnosis not present

## 2014-05-14 DIAGNOSIS — Z7901 Long term (current) use of anticoagulants: Secondary | ICD-10-CM

## 2014-05-14 DIAGNOSIS — I4892 Unspecified atrial flutter: Secondary | ICD-10-CM | POA: Diagnosis not present

## 2014-05-14 LAB — POCT INR: INR: 2.2

## 2014-05-14 NOTE — Addendum Note (Signed)
Addended by: Theophilus Kinds on: 05/14/2014 09:46 AM   Modules accepted: Level of Service

## 2014-06-02 ENCOUNTER — Other Ambulatory Visit: Payer: Self-pay | Admitting: Cardiovascular Disease

## 2014-06-03 NOTE — Telephone Encounter (Signed)
Please review for refill. Thanks!  

## 2014-06-11 ENCOUNTER — Ambulatory Visit (INDEPENDENT_AMBULATORY_CARE_PROVIDER_SITE_OTHER): Payer: Medicare Other

## 2014-06-11 DIAGNOSIS — Z5181 Encounter for therapeutic drug level monitoring: Secondary | ICD-10-CM

## 2014-06-11 DIAGNOSIS — Z7901 Long term (current) use of anticoagulants: Secondary | ICD-10-CM | POA: Diagnosis not present

## 2014-06-11 DIAGNOSIS — I4892 Unspecified atrial flutter: Secondary | ICD-10-CM | POA: Diagnosis not present

## 2014-06-11 LAB — POCT INR: INR: 2

## 2014-06-17 ENCOUNTER — Ambulatory Visit (INDEPENDENT_AMBULATORY_CARE_PROVIDER_SITE_OTHER): Payer: Medicare Other | Admitting: Internal Medicine

## 2014-06-17 ENCOUNTER — Encounter: Payer: Self-pay | Admitting: Internal Medicine

## 2014-06-17 VITALS — BP 128/68 | HR 67 | Temp 98.3°F | Wt 239.0 lb

## 2014-06-17 DIAGNOSIS — N400 Enlarged prostate without lower urinary tract symptoms: Secondary | ICD-10-CM | POA: Diagnosis not present

## 2014-06-17 DIAGNOSIS — I4892 Unspecified atrial flutter: Secondary | ICD-10-CM

## 2014-06-17 DIAGNOSIS — M1710 Unilateral primary osteoarthritis, unspecified knee: Secondary | ICD-10-CM | POA: Diagnosis not present

## 2014-06-17 DIAGNOSIS — I6523 Occlusion and stenosis of bilateral carotid arteries: Secondary | ICD-10-CM

## 2014-06-17 DIAGNOSIS — E119 Type 2 diabetes mellitus without complications: Secondary | ICD-10-CM | POA: Diagnosis not present

## 2014-06-17 LAB — HEMOGLOBIN A1C: Hgb A1c MFr Bld: 7.3 % — ABNORMAL HIGH (ref 4.6–6.5)

## 2014-06-17 NOTE — Assessment & Plan Note (Signed)
Control is not as good per his readings Will check A1c

## 2014-06-17 NOTE — Assessment & Plan Note (Signed)
Rate is fine On coumadin 

## 2014-06-17 NOTE — Patient Instructions (Addendum)
Please try arthritis tylenol 650mg  three times a day. You can call for an appointment if you want to try a cortisone shot.

## 2014-06-17 NOTE — Assessment & Plan Note (Signed)
Will add tylenol Consider cortisone shot Refer to Dr Marry Guan

## 2014-06-17 NOTE — Assessment & Plan Note (Signed)
No symptoms to suggest prostatitis now On dual therapy Will try pads---consider urology for consideration of procedure

## 2014-06-17 NOTE — Progress Notes (Signed)
Subjective:    Patient ID: Michael Colon, male    DOB: [birth year], 81 y.o.   MRN: [id number]  HPI Here for follow up of diabetes and other chronic medical conditions  He feels okay Recent cardiology visit---no changes No palpitations that he is aware of No chest pain No SOB No dizziness or syncope  Ongoing knee problems--- left usually more than right Very stiff after sitting for a while or after sleeping--hard to walk on it for awhile (10-20 seconds) Doesn't kneel down now due to this Notices a click when lifting leg up Uses 2 ibuprofen bid---also on glucosamine MSM 2 daily Pain is not a big issue most of the time Does need to sit if he walks for awhile--like in [employer]  More and more dribbling lately Already on dual therapy Flow seems okay at first---then trickles down For now--will try pads  Left 3rd finger will click--especially after sleeping Has to extend it manually Normal ROM  Checks sugars are slightly higher 120-140 No hypoglycemic reactions No foot numbness or pain  Current Outpatient Prescriptions on File Prior to Visit  Medication Sig Dispense Refill  . BAYER CONTOUR TEST test strip USE STRIP AS INSTRUCTED TO TEST BLOOD SUGAR TWICE DAILY 100 each 11  . docusate sodium (COLACE) 100 MG capsule Take 100 mg by mouth 2 (two) times daily.     . famotidine (PEPCID) 20 MG tablet Take 20 mg by mouth 1 dose over 24 hours.     . finasteride (PROSCAR) 5 MG tablet TAKE ONE TABLET BY MOUTH ONCE DAILY 90 tablet 2  . fish oil-omega-3 fatty acids 1000 MG capsule Take 1 g by mouth daily.     . Glucosamine Sulfate-MSM (GLUCOSAMINE-MSM DS) 500-500 MG TABS Take 1,500 mg/day by mouth daily.    Marland Kitchen glucose blood test strip Use as instructed to test blood sugar once daily dx: 250.00    . ibuprofen (ADVIL,MOTRIN) 200 MG tablet Take 400 mg by mouth 2 (two) times daily.    Marland Kitchen loratadine (CLARITIN) 10 MG tablet Take 10 mg by mouth daily.      Marland Kitchen MICROLET LANCETS MISC Use to test  twice daily or as directed 100 each 3  . Multiple Vitamins-Minerals (CENTRUM SILVER PO) Take by mouth daily after breakfast.      . nitroGLYCERIN (NITROSTAT) 0.4 MG SL tablet Place 0.4 mg under the tongue every 5 (five) minutes as needed for chest pain.    . tamsulosin (FLOMAX) 0.4 MG CAPS capsule Take 1 capsule (0.4 mg total) by mouth daily. 90 capsule 3  . triamterene-hydrochlorothiazide (DYAZIDE) 37.5-25 MG per capsule TAKE ONE CAPSULE BY MOUTH IN THE MORNING 90 capsule 3  . warfarin (COUMADIN) 5 MG tablet TAKE ONE TO ONE & ONE-HALF TABLETS (5 TO 7.5MG  TOTAL) BY MOUTH ONCE DAILY AS DIRECTED 135 tablet 0   No current facility-administered medications on file prior to visit.    Allergies  Allergen Reactions  . Enoxaparin Sodium Hives  . Latex Rash  . Percocet [Oxycodone-Acetaminophen] Rash    Past Medical History  Diagnosis Date  . Chronic prostatitis   . Obstructive sleep apnea     CPAP-9  . Chronic ear infection   . Asthma   . GERD (gastroesophageal reflux disease)     laryngeal involvement  . Hypertension   . Osteoarthrosis, localized, primary, knee     post-traumatic  . Diabetes mellitus   . Tachy-brady syndrome   . Atrial flutter 2007  . Benign prostatic hypertrophy   .  Hyperlipidemia   . Band keratopathy   . UTI (urinary tract infection)   . Prostatitis     Past Surgical History  Procedure Laterality Date  . Mastoidectomy  8/08    Dr Idelle Crouch  . Rhinoplasty  5/10    and septoplasty  . Cataract extraction, bilateral  2009  . Belpharoptosis repair      Dr [name]---didn't resolve weepy eye and eyelid drooping  . Knee surgery  1998    plate after fracture, then removed for infection  . Cardioversion  04/13/2010  . Chest pain  8/12    Stress test benign  . Appendectomy    . Left elbow surgery    . Venous ablation    . Tear duct probing  11/13    Dr Vickki Muff  . Subacromial decompression Right 2005    Arthroscopic (for rotator cuff and biceps tendon ruptures)      Family History  Problem Relation Age of Onset  . Other Mother     natural causes  . Colon cancer Father   . Diabetes Father     History   Social History  . Marital Status: Married    Spouse Name: N/A  . Number of Children: 2  . 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 Smoking status: Former Smoker -- 1.00 packs/day for 0 years    Types: Cigarettes    Quit date: 01/03/1969  . Smokeless tobacco: Never Used  . Alcohol Use: No     Comment: rare wine  . Drug Use: No  . Sexual Activity: Not on file   Other Topics Concern  . Not on file   Social History Narrative   Has living will   Health care POA-- wife and then son Shanon Brow   Has DNR order from the past---form redone 06/25/10   Probably no feeding tube if cognitively unaware   Review of Systems Appetite is fine Weight is stable Sleeps okay Wearing right ankle brace again    Objective:   Physical Exam  Constitutional: He appears well-developed and well-nourished. No distress.  Neck: Normal range of motion. Neck supple. No thyromegaly present.  Cardiovascular: Normal rate, regular rhythm, normal heart sounds and intact distal pulses.  Exam reveals no gallop.   No murmur heard. Pulmonary/Chest: Effort normal and breath sounds normal. No respiratory distress. He has no wheezes. He has no rales.  Musculoskeletal: He exhibits no edema or tenderness.  Normal flexion and extension of left fingers Fair passive ROM without too much crepitus in left knee  Lymphadenopathy:    He has no cervical adenopathy.  Skin:  No foot lesions  Psychiatric: He has a normal mood and affect. His behavior is normal.          Assessment & Plan:

## 2014-07-09 DIAGNOSIS — B351 Tinea unguium: Secondary | ICD-10-CM | POA: Diagnosis not present

## 2014-07-09 DIAGNOSIS — E1142 Type 2 diabetes mellitus with diabetic polyneuropathy: Secondary | ICD-10-CM | POA: Diagnosis not present

## 2014-07-09 DIAGNOSIS — L851 Acquired keratosis [keratoderma] palmaris et plantaris: Secondary | ICD-10-CM | POA: Diagnosis not present

## 2014-07-11 ENCOUNTER — Telehealth: Payer: Self-pay | Admitting: Internal Medicine

## 2014-07-11 NOTE — Telephone Encounter (Signed)
Opened in error

## 2014-07-16 ENCOUNTER — Ambulatory Visit (INDEPENDENT_AMBULATORY_CARE_PROVIDER_SITE_OTHER): Payer: Medicare Other | Admitting: *Deleted

## 2014-07-16 DIAGNOSIS — I4892 Unspecified atrial flutter: Secondary | ICD-10-CM

## 2014-07-16 DIAGNOSIS — Z5181 Encounter for therapeutic drug level monitoring: Secondary | ICD-10-CM | POA: Diagnosis not present

## 2014-07-16 DIAGNOSIS — Z7901 Long term (current) use of anticoagulants: Secondary | ICD-10-CM

## 2014-07-16 LAB — POCT INR: INR: 1.9

## 2014-07-17 DIAGNOSIS — M25562 Pain in left knee: Secondary | ICD-10-CM | POA: Diagnosis not present

## 2014-07-17 DIAGNOSIS — M25561 Pain in right knee: Secondary | ICD-10-CM | POA: Diagnosis not present

## 2014-07-17 DIAGNOSIS — M1711 Unilateral primary osteoarthritis, right knee: Secondary | ICD-10-CM | POA: Diagnosis not present

## 2014-07-17 DIAGNOSIS — M1732 Unilateral post-traumatic osteoarthritis, left knee: Secondary | ICD-10-CM | POA: Diagnosis not present

## 2014-07-17 DIAGNOSIS — G8929 Other chronic pain: Secondary | ICD-10-CM | POA: Diagnosis not present

## 2014-08-06 ENCOUNTER — Ambulatory Visit (INDEPENDENT_AMBULATORY_CARE_PROVIDER_SITE_OTHER): Payer: Medicare Other | Admitting: *Deleted

## 2014-08-06 DIAGNOSIS — Z7901 Long term (current) use of anticoagulants: Secondary | ICD-10-CM

## 2014-08-06 DIAGNOSIS — Z5181 Encounter for therapeutic drug level monitoring: Secondary | ICD-10-CM

## 2014-08-06 DIAGNOSIS — I4892 Unspecified atrial flutter: Secondary | ICD-10-CM

## 2014-08-06 DIAGNOSIS — H18423 Band keratopathy, bilateral: Secondary | ICD-10-CM | POA: Diagnosis not present

## 2014-08-06 LAB — POCT INR: INR: 2.6

## 2014-08-11 ENCOUNTER — Telehealth: Payer: Self-pay | Admitting: *Deleted

## 2014-08-11 NOTE — Telephone Encounter (Signed)
Received fax asked for medical clearance for surgery 09/01/2014 and per Dr. Silvio Pate he doesn't need to do a separate clearance if Dr.Gollan gives cardiac clearance.   Left message with receptionist at [facility] ortho for [place].

## 2014-08-20 ENCOUNTER — Telehealth: Payer: Self-pay

## 2014-08-20 ENCOUNTER — Encounter
Admission: RE | Admit: 2014-08-20 | Discharge: 2014-08-20 | Disposition: A | Discharge status: Home / Self Care | Payer: Medicare Other | Source: Ambulatory Visit | Attending: Orthopedic Surgery | Admitting: Orthopedic Surgery

## 2014-08-20 ENCOUNTER — Telehealth: Payer: Self-pay | Admitting: Internal Medicine

## 2014-08-20 DIAGNOSIS — Z01812 Encounter for preprocedural laboratory examination: Secondary | ICD-10-CM | POA: Diagnosis not present

## 2014-08-20 HISTORY — DX: Chronic kidney disease, unspecified: N18.9

## 2014-08-20 HISTORY — DX: Cerebral infarction, unspecified: I63.9

## 2014-08-20 LAB — CBC
HCT: 50.2 % (ref 40.0–52.0)
Hemoglobin: 17.1 g/dL (ref 13.0–18.0)
MCH: 31.2 pg (ref 26.0–34.0)
MCHC: 34.1 g/dL (ref 32.0–36.0)
MCV: 91.6 fL (ref 80.0–100.0)
Platelets: 123 10*3/uL — ABNORMAL LOW (ref 150–440)
RBC: 5.48 MIL/uL (ref 4.40–5.90)
RDW: 13.3 % (ref 11.5–14.5)
WBC: 6.7 10*3/uL (ref 3.8–10.6)

## 2014-08-20 LAB — PROTIME-INR
INR: 2.21
Prothrombin Time: 24.7 seconds — ABNORMAL HIGH (ref 11.4–15.0)

## 2014-08-20 LAB — BASIC METABOLIC PANEL
Anion gap: 7 (ref 5–15)
BUN: 27 mg/dL — ABNORMAL HIGH (ref 6–20)
CO2: 27 mmol/L (ref 22–32)
Calcium: 10.2 mg/dL (ref 8.9–10.3)
Chloride: 99 mmol/L — ABNORMAL LOW (ref 101–111)
Creatinine, Ser: 0.75 mg/dL (ref 0.61–1.24)
GFR calc Af Amer: >60 mL/min (ref >60)
GFR calc non Af Amer: >60 mL/min (ref >60)
Glucose, Bld: 174 mg/dL — ABNORMAL HIGH (ref 65–99)
Potassium: 3.6 mmol/L (ref 3.5–5.1)
Sodium: 133 mmol/L — ABNORMAL LOW (ref 135–145)

## 2014-08-20 LAB — HEMOGLOBIN A1C: Hgb A1c MFr Bld: 7.8 % — ABNORMAL HIGH (ref 4.0–6.0)

## 2014-08-20 LAB — APTT: aPTT: 36 seconds (ref 24–36)

## 2014-08-20 LAB — SURGICAL PCR SCREEN
MRSA, PCR: NEGATIVE
Staphylococcus aureus: NEGATIVE

## 2014-08-20 LAB — URINALYSIS COMPLETE WITH MICROSCOPIC (ARMC ONLY)
Bacteria, UA: NONE SEEN
Bilirubin Urine: NEGATIVE
Glucose, UA: 50 mg/dL — AB
Hgb urine dipstick: NEGATIVE
Ketones, ur: NEGATIVE mg/dL
Nitrite: NEGATIVE
Protein, ur: NEGATIVE mg/dL
Specific Gravity, Urine: 1.023 (ref 1.005–1.030)
pH: 5 (ref 5.0–8.0)

## 2014-08-20 LAB — TYPE AND SCREEN
ABO/RH(D): AB NEG
Antibody Screen: NEGATIVE

## 2014-08-20 LAB — ABO/RH: ABO/RH(D): AB NEG

## 2014-08-20 LAB — C-REACTIVE PROTEIN: CRP: 0.8 mg/dL (ref <1.0)

## 2014-08-20 LAB — SEDIMENTATION RATE: Sed Rate: 3 mm/hr (ref 0–20)

## 2014-08-20 NOTE — Patient Instructions (Signed)
  Your procedure is scheduled ZC:HYIFOY 29, 2016 (Monday) Report to Same Day Surgery. To find out your arrival time please call [phone] between 1PM - 3PM on August 29, 2014 (Friday).  Remember: Instructions that are not followed completely may result in serious medical risk, up to and including death, or upon the discretion of your surgeon and anesthesiologist your surgery may need to be rescheduled.    __x__ 1. Do not eat food or drink liquids after midnight. No gum chewing or hard candies.     __x__ 2. No Alcohol for 24 hours before or after surgery.   ____ 3. Bring all medications with you on the day of surgery if instructed.    __x__ 4. Notify your doctor if there is any change in your medical condition     (cold, fever, infections).     Do not wear jewelry, make-up, hairpins, clips or nail polish.  Do not wear lotions, powders, or perfumes. You may wear deodorant.  Do not shave 48 hours prior to surgery. Men may shave face and neck.  Do not bring valuables to the hospital.    [facility] is not responsible for any belongings or valuables.               Contacts, dentures or bridgework may not be worn into surgery.  Leave your suitcase in the car. After surgery it may be brought to your room.  For patients admitted to the hospital, discharge time is determined by your                treatment team.   Patients discharged the day of surgery will not be allowed to drive home.   Please read over the following fact sheets that you were given:   MRSA Information and Surgical Site Infection Prevention   ____ Take these medicines the morning of surgery with A SIP OF WATER:    1. Pepcid (Pepcid at bedtime on August 28)  2.   3.   4.  5.  6.  ____ Fleet Enema (as directed)   __x__ Use CHG Soap as directed  ____ Use inhalers on the day of surgery  ____ Stop metformin 2 days prior to surgery    ____ Take 1/2 of usual insulin dose the night before surgery and none on the  morning of surgery.   _x___ Stop Coumadin/Plavix/aspirin on  (STOP WARFARIN 5 (FIVE) DAYS PRIOR TO SURGERY AS INSTRUCTED BY DR HOOTEN OFFICE)  _x___ Stop Anti-inflammatories on (STOP IBUPROFEN ONE WEEK PRIOR TO SURGERY)   _x___ Stop supplements until after surgery.  (STOP GLUCOSAMINE AND FISH OIL NOW)  _x___ Bring C-Pap to the hospital.

## 2014-08-20 NOTE — Telephone Encounter (Signed)
Mrs Folks left v/m wanting to know if pt should be taking finasteride and flomax at the same time. Mrs Bayron request cb.

## 2014-08-21 NOTE — Telephone Encounter (Signed)
Yes--they both work on the prostate but do different things

## 2014-08-21 NOTE — Telephone Encounter (Signed)
Spoke with patient's wife and advised results  

## 2014-08-22 LAB — URINE CULTURE: Culture: 4000

## 2014-08-22 NOTE — Telephone Encounter (Signed)
Opened encounter in error  

## 2014-08-25 ENCOUNTER — Telehealth: Payer: Self-pay | Admitting: *Deleted

## 2014-08-25 NOTE — Telephone Encounter (Signed)
Please contact patient's wife once completed at [phone] (DPR signed).

## 2014-08-25 NOTE — Telephone Encounter (Signed)
Patient is having surgery 8/29 (total knee) and would like updated CPAP supplies to bring to the hospital.  Patient's wife contacted [facility] but they need an updated prescription.  Okay to order?  If so, please fax new prescription to [phone].

## 2014-08-25 NOTE — Telephone Encounter (Signed)
Rx written--hopefully they don't need more explicit orders

## 2014-08-26 ENCOUNTER — Telehealth: Payer: Self-pay | Admitting: Internal Medicine

## 2014-08-26 NOTE — Telephone Encounter (Signed)
Spoke with patient and advised results   

## 2014-08-26 NOTE — Telephone Encounter (Signed)
Spoke with patient and advised results. I asked what his settings where and when was the last time he had a sleep study, in the past with other companies this was required. Order faxed to [facility]

## 2014-08-26 NOTE — Telephone Encounter (Signed)
Patient's wife called and said she has the information [place] needs.

## 2014-08-27 ENCOUNTER — Telehealth: Payer: Self-pay | Admitting: Cardiovascular Disease

## 2014-08-27 NOTE — Telephone Encounter (Signed)
Needs cardiac Clearance and coumadin recommendation.  Having surgery Monday 09/01/14.   Please fax [phone] and call Margaretha Sheffield to confirm sent .

## 2014-08-27 NOTE — Telephone Encounter (Signed)
S/w Margaretha Sheffield at [facility] who states pt having left total knee arthoplasty on 8/29. Asking for cardiac clearance and coumadin recommendation. States she sent fax over in August and has not yet received it back. Forward to [place] to advise

## 2014-08-29 NOTE — Telephone Encounter (Signed)
Tried calling home number, phone just rang no VM, called cell number listed in chart, left VM.   Received fax today from [facility] that they need patient's most recent sleep study and per wife on (earlier call) last sleep study was done 07/18/2004. I called and spoke with [facility] and asked if we got a copy of the sleep study from 2006 would that be ok? Per [facility] pt would need a new sleep study.  Form on your desk

## 2014-08-29 NOTE — Telephone Encounter (Signed)
Asked Dr. Rockey Situ to review, as Thurmond Butts has been in the hospital w/ pts.  Per Dr. Rockey Situ, pt is cleared to proceed w/ TKR, make sure that pt is holding coumadin over the weekend and have stat INR on Monday if needed.  Spoke w/ pt.  He has been holding coumadin since Wednesday.  Attempted to contact Margaretha Sheffield, but it is after hours, routed to [phone].

## 2014-09-01 ENCOUNTER — Encounter
Admission: RE | Disposition: A | Discharge status: Skilled Nursing Facility | Payer: Self-pay | Source: Ambulatory Visit | Attending: Orthopedic Surgery

## 2014-09-01 ENCOUNTER — Inpatient Hospital Stay
Admission: RE | Admit: 2014-09-01 | Discharge: 2014-09-04 | DRG: 470 | Disposition: A | Discharge status: Skilled Nursing Facility | Payer: Medicare Other | Source: Ambulatory Visit | Attending: Orthopedic Surgery | Admitting: Orthopedic Surgery

## 2014-09-01 ENCOUNTER — Inpatient Hospital Stay: Payer: Medicare Other

## 2014-09-01 ENCOUNTER — Inpatient Hospital Stay: Payer: Medicare Other | Admitting: Anesthesiology

## 2014-09-01 ENCOUNTER — Encounter: Payer: Self-pay | Admitting: *Deleted

## 2014-09-01 DIAGNOSIS — N400 Enlarged prostate without lower urinary tract symptoms: Secondary | ICD-10-CM | POA: Diagnosis present

## 2014-09-01 DIAGNOSIS — S82142S Displaced bicondylar fracture of left tibia, sequela: Secondary | ICD-10-CM | POA: Diagnosis not present

## 2014-09-01 DIAGNOSIS — G4733 Obstructive sleep apnea (adult) (pediatric): Secondary | ICD-10-CM | POA: Diagnosis not present

## 2014-09-01 DIAGNOSIS — K219 Gastro-esophageal reflux disease without esophagitis: Secondary | ICD-10-CM | POA: Diagnosis present

## 2014-09-01 DIAGNOSIS — M1712 Unilateral primary osteoarthritis, left knee: Secondary | ICD-10-CM | POA: Diagnosis not present

## 2014-09-01 DIAGNOSIS — Z87891 Personal history of nicotine dependence: Secondary | ICD-10-CM

## 2014-09-01 DIAGNOSIS — Z471 Aftercare following joint replacement surgery: Secondary | ICD-10-CM | POA: Diagnosis not present

## 2014-09-01 DIAGNOSIS — E119 Type 2 diabetes mellitus without complications: Secondary | ICD-10-CM | POA: Diagnosis not present

## 2014-09-01 DIAGNOSIS — Z79899 Other long term (current) drug therapy: Secondary | ICD-10-CM

## 2014-09-01 DIAGNOSIS — N411 Chronic prostatitis: Secondary | ICD-10-CM | POA: Diagnosis not present

## 2014-09-01 DIAGNOSIS — M175 Other unilateral secondary osteoarthritis of knee: Secondary | ICD-10-CM | POA: Diagnosis not present

## 2014-09-01 DIAGNOSIS — X58XXXS Exposure to other specified factors, sequela: Secondary | ICD-10-CM | POA: Diagnosis present

## 2014-09-01 DIAGNOSIS — Z96652 Presence of left artificial knee joint: Secondary | ICD-10-CM | POA: Diagnosis not present

## 2014-09-01 DIAGNOSIS — M179 Osteoarthritis of knee, unspecified: Secondary | ICD-10-CM | POA: Diagnosis not present

## 2014-09-01 DIAGNOSIS — M1732 Unilateral post-traumatic osteoarthritis, left knee: Secondary | ICD-10-CM | POA: Diagnosis present

## 2014-09-01 DIAGNOSIS — M1990 Unspecified osteoarthritis, unspecified site: Secondary | ICD-10-CM | POA: Diagnosis not present

## 2014-09-01 DIAGNOSIS — E1122 Type 2 diabetes mellitus with diabetic chronic kidney disease: Secondary | ICD-10-CM | POA: Diagnosis present

## 2014-09-01 DIAGNOSIS — J45909 Unspecified asthma, uncomplicated: Secondary | ICD-10-CM | POA: Diagnosis present

## 2014-09-01 DIAGNOSIS — Z96659 Presence of unspecified artificial knee joint: Secondary | ICD-10-CM

## 2014-09-01 DIAGNOSIS — R2689 Other abnormalities of gait and mobility: Secondary | ICD-10-CM | POA: Diagnosis not present

## 2014-09-01 DIAGNOSIS — N189 Chronic kidney disease, unspecified: Secondary | ICD-10-CM | POA: Diagnosis present

## 2014-09-01 DIAGNOSIS — E785 Hyperlipidemia, unspecified: Secondary | ICD-10-CM | POA: Diagnosis present

## 2014-09-01 DIAGNOSIS — I129 Hypertensive chronic kidney disease with stage 1 through stage 4 chronic kidney disease, or unspecified chronic kidney disease: Secondary | ICD-10-CM | POA: Diagnosis present

## 2014-09-01 DIAGNOSIS — M6281 Muscle weakness (generalized): Secondary | ICD-10-CM | POA: Diagnosis not present

## 2014-09-01 DIAGNOSIS — Z7901 Long term (current) use of anticoagulants: Secondary | ICD-10-CM

## 2014-09-01 DIAGNOSIS — Z8673 Personal history of transient ischemic attack (TIA), and cerebral infarction without residual deficits: Secondary | ICD-10-CM

## 2014-09-01 DIAGNOSIS — I4892 Unspecified atrial flutter: Secondary | ICD-10-CM | POA: Diagnosis not present

## 2014-09-01 HISTORY — PX: TOTAL KNEE ARTHROPLASTY: SHX125

## 2014-09-01 LAB — GLUCOSE, CAPILLARY
Glucose-Capillary: 179 mg/dL — ABNORMAL HIGH (ref 65–99)
Glucose-Capillary: 183 mg/dL — ABNORMAL HIGH (ref 65–99)
Glucose-Capillary: 215 mg/dL — ABNORMAL HIGH (ref 65–99)
Glucose-Capillary: 253 mg/dL — ABNORMAL HIGH (ref 65–99)

## 2014-09-01 LAB — PROTIME-INR
INR: 1.15
Prothrombin Time: 14.9 seconds (ref 11.4–15.0)

## 2014-09-01 SURGERY — ARTHROPLASTY, KNEE, TOTAL
Anesthesia: Spinal | Laterality: Left

## 2014-09-01 MED ORDER — METOCLOPRAMIDE HCL 10 MG PO TABS
10.0000 mg | ORAL_TABLET | Freq: Three times a day (TID) | ORAL | Status: AC
  Administered 2014-09-01 – 2014-09-03 (×8): 10 mg via ORAL
  Filled 2014-09-01 (×8): qty 1

## 2014-09-01 MED ORDER — TRAMADOL HCL 50 MG PO TABS
50.0000 mg | ORAL_TABLET | ORAL | Status: DC | PRN
  Administered 2014-09-01 – 2014-09-02 (×4): 50 mg via ORAL
  Administered 2014-09-03: 100 mg via ORAL
  Administered 2014-09-03: 50 mg via ORAL
  Administered 2014-09-03: 100 mg via ORAL
  Filled 2014-09-01: qty 1
  Filled 2014-09-01: qty 2
  Filled 2014-09-01 (×4): qty 1
  Filled 2014-09-01: qty 2

## 2014-09-01 MED ORDER — FERROUS SULFATE 325 (65 FE) MG PO TABS
325.0000 mg | ORAL_TABLET | Freq: Two times a day (BID) | ORAL | Status: DC
  Administered 2014-09-01 – 2014-09-04 (×6): 325 mg via ORAL
  Filled 2014-09-01 (×6): qty 1

## 2014-09-01 MED ORDER — ONDANSETRON HCL 4 MG PO TABS: 4.0000 mg | ORAL_TABLET | Freq: Four times a day (QID) | ORAL | Status: DC | PRN

## 2014-09-01 MED ORDER — MENTHOL 3 MG MT LOZG: 1.0000 | LOZENGE | OROMUCOSAL | Status: DC | PRN

## 2014-09-01 MED ORDER — CEFAZOLIN SODIUM-DEXTROSE 2-3 GM-% IV SOLR
2.0000 g | Freq: Once | INTRAVENOUS | Status: AC
  Administered 2014-09-01: 2 g via INTRAVENOUS

## 2014-09-01 MED ORDER — NITROGLYCERIN 0.4 MG SL SUBL: 0.4000 mg | SUBLINGUAL_TABLET | SUBLINGUAL | Status: DC | PRN

## 2014-09-01 MED ORDER — FENTANYL CITRATE (PF) 100 MCG/2ML IJ SOLN
25.0000 ug | INTRAMUSCULAR | Status: DC | PRN
  Administered 2014-09-01 (×2): 25 ug via INTRAVENOUS

## 2014-09-01 MED ORDER — ONDANSETRON HCL 4 MG/2ML IJ SOLN
INTRAMUSCULAR | Status: DC | PRN
  Administered 2014-09-01: 4 mg via INTRAVENOUS

## 2014-09-01 MED ORDER — TAMSULOSIN HCL 0.4 MG PO CAPS
0.4000 mg | ORAL_CAPSULE | Freq: Every day | ORAL | Status: DC
  Administered 2014-09-01 – 2014-09-04 (×4): 0.4 mg via ORAL
  Filled 2014-09-01 (×4): qty 1

## 2014-09-01 MED ORDER — GLYCOPYRROLATE 0.2 MG/ML IJ SOLN
0.2000 mg | Freq: Once | INTRAMUSCULAR | Status: AC
  Administered 2014-09-01: 0.2 mg via INTRAVENOUS

## 2014-09-01 MED ORDER — SODIUM CHLORIDE 0.9 % IV SOLN
INTRAVENOUS | Status: DC
  Administered 2014-09-01 (×2): via INTRAVENOUS

## 2014-09-01 MED ORDER — ONDANSETRON HCL 4 MG/2ML IJ SOLN: 4.0000 mg | Freq: Once | INTRAMUSCULAR | Status: DC | PRN

## 2014-09-01 MED ORDER — WARFARIN SODIUM 2.5 MG PO TABS
7.5000 mg | ORAL_TABLET | ORAL | Status: DC
  Administered 2014-09-01 – 2014-09-03 (×3): 7.5 mg via ORAL
  Filled 2014-09-01 (×3): qty 3

## 2014-09-01 MED ORDER — ACETAMINOPHEN 10 MG/ML IV SOLN
1000.0000 mg | Freq: Four times a day (QID) | INTRAVENOUS | Status: AC
  Administered 2014-09-01 – 2014-09-02 (×4): 1000 mg via INTRAVENOUS
  Filled 2014-09-01 (×7): qty 100

## 2014-09-01 MED ORDER — WARFARIN SODIUM 2.5 MG PO TABS: 5.0000 mg | ORAL_TABLET | ORAL | Status: DC

## 2014-09-01 MED ORDER — MIDAZOLAM HCL 5 MG/5ML IJ SOLN
INTRAMUSCULAR | Status: DC | PRN
  Administered 2014-09-01: 1 mg via INTRAVENOUS
  Administered 2014-09-01: 2 mg via INTRAVENOUS

## 2014-09-01 MED ORDER — ADULT MULTIVITAMIN W/MINERALS CH
ORAL_TABLET | Freq: Every day | ORAL | Status: DC
  Administered 2014-09-02 – 2014-09-04 (×3): 1 via ORAL
  Filled 2014-09-01 (×4): qty 1

## 2014-09-01 MED ORDER — FLEET ENEMA 7-19 GM/118ML RE ENEM: 1.0000 | ENEMA | Freq: Once | RECTAL | Status: DC | PRN

## 2014-09-01 MED ORDER — KETAMINE HCL 50 MG/ML IJ SOLN
INTRAMUSCULAR | Status: DC | PRN
  Administered 2014-09-01 (×2): 25 mg via INTRAMUSCULAR

## 2014-09-01 MED ORDER — LORATADINE 10 MG PO TABS
10.0000 mg | ORAL_TABLET | Freq: Every day | ORAL | Status: DC
  Administered 2014-09-01 – 2014-09-04 (×4): 10 mg via ORAL
  Filled 2014-09-01 (×4): qty 1

## 2014-09-01 MED ORDER — SENNOSIDES-DOCUSATE SODIUM 8.6-50 MG PO TABS
1.0000 | ORAL_TABLET | Freq: Two times a day (BID) | ORAL | Status: DC
  Administered 2014-09-01 – 2014-09-04 (×7): 1 via ORAL
  Filled 2014-09-01 (×7): qty 1

## 2014-09-01 MED ORDER — ACETAMINOPHEN 650 MG RE SUPP: 650.0000 mg | Freq: Four times a day (QID) | RECTAL | Status: DC | PRN

## 2014-09-01 MED ORDER — BISACODYL 10 MG RE SUPP: 10.0000 mg | Freq: Every day | RECTAL | Status: DC | PRN

## 2014-09-01 MED ORDER — FENTANYL CITRATE (PF) 100 MCG/2ML IJ SOLN
INTRAMUSCULAR | Status: DC | PRN
  Administered 2014-09-01: 25 ug via INTRAVENOUS

## 2014-09-01 MED ORDER — ALUM & MAG HYDROXIDE-SIMETH 200-200-20 MG/5ML PO SUSP: 30.0000 mL | ORAL | Status: DC | PRN

## 2014-09-01 MED ORDER — SODIUM CHLORIDE 0.9 % IV SOLN
INTRAVENOUS | Status: DC | PRN
Start: 2014-09-01 — End: 2014-09-01
  Administered 2014-09-01: 60 mL

## 2014-09-01 MED ORDER — PHENOL 1.4 % MT LIQD: 1.0000 | OROMUCOSAL | Status: DC | PRN

## 2014-09-01 MED ORDER — MORPHINE SULFATE (PF) 2 MG/ML IV SOLN
2.0000 mg | INTRAVENOUS | Status: DC | PRN
  Administered 2014-09-01: 2 mg via INTRAVENOUS
  Filled 2014-09-01: qty 1

## 2014-09-01 MED ORDER — PANTOPRAZOLE SODIUM 40 MG PO TBEC
40.0000 mg | DELAYED_RELEASE_TABLET | Freq: Two times a day (BID) | ORAL | Status: DC
  Administered 2014-09-01 – 2014-09-04 (×7): 40 mg via ORAL
  Filled 2014-09-01 (×7): qty 1

## 2014-09-01 MED ORDER — TRIAMTERENE-HCTZ 37.5-25 MG PO CAPS
1.0000 | ORAL_CAPSULE | Freq: Every morning | ORAL | Status: DC
  Filled 2014-09-01: qty 1

## 2014-09-01 MED ORDER — PROPOFOL INFUSION 10 MG/ML OPTIME
INTRAVENOUS | Status: DC | PRN
  Administered 2014-09-01: 50 ug/kg/min via INTRAVENOUS

## 2014-09-01 MED ORDER — OMEGA-3-ACID ETHYL ESTERS 1 G PO CAPS
1.0000 g | ORAL_CAPSULE | Freq: Every day | ORAL | Status: DC
  Administered 2014-09-02 – 2014-09-04 (×3): 1 g via ORAL
  Filled 2014-09-01 (×4): qty 1

## 2014-09-01 MED ORDER — GLYCOPYRROLATE 0.2 MG/ML IJ SOLN
INTRAMUSCULAR | Status: DC | PRN
  Administered 2014-09-01: 0.2 mg via INTRAVENOUS

## 2014-09-01 MED ORDER — FINASTERIDE 5 MG PO TABS
5.0000 mg | ORAL_TABLET | Freq: Every day | ORAL | Status: DC
  Administered 2014-09-01 – 2014-09-04 (×4): 5 mg via ORAL
  Filled 2014-09-01 (×4): qty 1

## 2014-09-01 MED ORDER — ACETAMINOPHEN 325 MG PO TABS
650.0000 mg | ORAL_TABLET | Freq: Four times a day (QID) | ORAL | Status: DC | PRN
Start: 2014-09-01 — End: 2014-09-04

## 2014-09-01 MED ORDER — ONDANSETRON HCL 4 MG/2ML IJ SOLN: 4.0000 mg | Freq: Four times a day (QID) | INTRAMUSCULAR | Status: DC | PRN

## 2014-09-01 MED ORDER — BUPIVACAINE-EPINEPHRINE 0.25% -1:200000 IJ SOLN
INTRAMUSCULAR | Status: DC | PRN
  Administered 2014-09-01: 30 mL

## 2014-09-01 MED ORDER — MAGNESIUM HYDROXIDE 400 MG/5ML PO SUSP
30.0000 mL | Freq: Every day | ORAL | Status: DC | PRN
  Administered 2014-09-03: 30 mL via ORAL
  Filled 2014-09-01: qty 30

## 2014-09-01 MED ORDER — PROPOFOL 10 MG/ML IV BOLUS
INTRAVENOUS | Status: DC | PRN
  Administered 2014-09-01: 20 mg via INTRAVENOUS

## 2014-09-01 MED ORDER — CEFAZOLIN SODIUM-DEXTROSE 2-3 GM-% IV SOLR
2.0000 g | Freq: Four times a day (QID) | INTRAVENOUS | Status: AC
  Administered 2014-09-01 – 2014-09-02 (×4): 2 g via INTRAVENOUS
  Filled 2014-09-01 (×6): qty 50

## 2014-09-01 MED ORDER — INSULIN ASPART 100 UNIT/ML ~~LOC~~ SOLN
0.0000 [IU] | Freq: Three times a day (TID) | SUBCUTANEOUS | Status: DC
  Administered 2014-09-01: 5 [IU] via SUBCUTANEOUS
  Administered 2014-09-02: 3 [IU] via SUBCUTANEOUS
  Administered 2014-09-02: 8 [IU] via SUBCUTANEOUS
  Administered 2014-09-02 – 2014-09-03 (×2): 5 [IU] via SUBCUTANEOUS
  Administered 2014-09-03 – 2014-09-04 (×3): 8 [IU] via SUBCUTANEOUS
  Administered 2014-09-04: 5 [IU] via SUBCUTANEOUS
  Filled 2014-09-01 (×3): qty 5
  Filled 2014-09-01: qty 8
  Filled 2014-09-01: qty 5
  Filled 2014-09-01: qty 3
  Filled 2014-09-01 (×3): qty 8

## 2014-09-01 MED ORDER — DIPHENHYDRAMINE HCL 12.5 MG/5ML PO ELIX
12.5000 mg | ORAL_SOLUTION | ORAL | Status: DC | PRN
Start: 2014-09-01 — End: 2014-09-04

## 2014-09-01 MED ORDER — TAPENTADOL HCL 50 MG PO TABS
50.0000 mg | ORAL_TABLET | ORAL | Status: DC | PRN
  Administered 2014-09-01: 50 mg via ORAL
  Filled 2014-09-01: qty 1

## 2014-09-01 MED ORDER — ACETAMINOPHEN 10 MG/ML IV SOLN
INTRAVENOUS | Status: DC | PRN
  Administered 2014-09-01: 1000 mg via INTRAVENOUS

## 2014-09-01 MED ORDER — WARFARIN - PHARMACIST DOSING INPATIENT: Freq: Every day | Status: DC

## 2014-09-01 MED ORDER — SODIUM CHLORIDE 0.9 % IV SOLN
Freq: Once | INTRAVENOUS | Status: AC
  Administered 2014-09-01: 17:00:00 via INTRAVENOUS

## 2014-09-01 MED ORDER — NEOMYCIN-POLYMYXIN B GU 40-200000 IR SOLN
Status: DC | PRN
  Administered 2014-09-01: 16 mL

## 2014-09-01 MED ORDER — SODIUM CHLORIDE 0.9 % IV SOLN
INTRAVENOUS | Status: DC
  Administered 2014-09-01 – 2014-09-02 (×3): via INTRAVENOUS

## 2014-09-01 SURGICAL SUPPLY — 57 items
AUTOTRANSFUS HAS 1/8 (MISCELLANEOUS) ×2
BATTERY INSTRU NAVIGATION (MISCELLANEOUS) ×8 IMPLANT
BLADE SAW 1 (BLADE) ×2 IMPLANT
BLADE SAW 1/2 (BLADE) ×2 IMPLANT
BONE CEMENT GENTAMICIN (Cement) ×4 IMPLANT
CANISTER SUCT 1200ML W/VALVE (MISCELLANEOUS) ×2 IMPLANT
CANISTER SUCT 3000ML (MISCELLANEOUS) ×4 IMPLANT
CAP KNEE TOTAL 3 SIGMA ×2 IMPLANT
CATH TRAY METER 16FR LF (MISCELLANEOUS) ×2 IMPLANT
CEMENT BONE GENTAMICIN 40 (Cement) ×2 IMPLANT
COOLER POLAR GLACIER W/PUMP (MISCELLANEOUS) ×2 IMPLANT
DRAPE SHEET LG 3/4 BI-LAMINATE (DRAPES) ×2 IMPLANT
DRSG DERMACEA 8X12 NADH (GAUZE/BANDAGES/DRESSINGS) ×2 IMPLANT
DRSG OPSITE POSTOP 4X14 (GAUZE/BANDAGES/DRESSINGS) ×2 IMPLANT
DURAPREP 26ML APPLICATOR (WOUND CARE) ×4 IMPLANT
ELECT CAUTERY BLADE 6.4 (BLADE) ×2 IMPLANT
EX-PIN ORTHOLOCK NAV 4X150 (PIN) ×4 IMPLANT
GLOVE BIOGEL M STRL SZ7.5 (GLOVE) ×4 IMPLANT
GLOVE INDICATOR 8.0 STRL GRN (GLOVE) ×2 IMPLANT
GLOVE SURG 9.0 ORTHO LTXF (GLOVE) ×2 IMPLANT
GLOVE SURG ORTHO 9.0 STRL STRW (GLOVE) ×2 IMPLANT
GOWN STRL REUS W/ TWL LRG LVL3 (GOWN DISPOSABLE) ×1 IMPLANT
GOWN STRL REUS W/TWL 2XL LVL3 (GOWN DISPOSABLE) ×2 IMPLANT
GOWN STRL REUS W/TWL LRG LVL3 (GOWN DISPOSABLE) ×1
GOWN STRL REUS W/TWL XL LVL4 (GOWN DISPOSABLE) ×2 IMPLANT
HANDPIECE SUCTION TUBG SURGILV (MISCELLANEOUS) ×2 IMPLANT
HOLDER FOLEY CATH W/STRAP (MISCELLANEOUS) ×2 IMPLANT
HOOD PEEL AWAY FACE SHEILD DIS (HOOD) ×4 IMPLANT
KNIFE SCULPS 14X20 (INSTRUMENTS) ×2 IMPLANT
NDL SAFETY 18GX1.5 (NEEDLE) ×2 IMPLANT
NEEDLE SPNL 20GX3.5 QUINCKE YW (NEEDLE) ×2 IMPLANT
NS IRRIG 500ML POUR BTL (IV SOLUTION) ×2 IMPLANT
PACK TOTAL KNEE (MISCELLANEOUS) ×2 IMPLANT
PAD GROUND ADULT SPLIT (MISCELLANEOUS) ×2 IMPLANT
PAD WRAPON POLAR KNEE (MISCELLANEOUS) ×1 IMPLANT
PIN DRILL QUICK PACK ×2 IMPLANT
PIN FIXATION 1/8DIA X 3INL (PIN) ×2 IMPLANT
SOL .9 NS 3000ML IRR  AL (IV SOLUTION) ×1
SOL .9 NS 3000ML IRR UROMATIC (IV SOLUTION) ×1 IMPLANT
SOL PREP PVP 2OZ (MISCELLANEOUS) ×2
SOLUTION PREP PVP 2OZ (MISCELLANEOUS) ×1 IMPLANT
SPONGE DRAIN TRACH 4X4 STRL 2S (GAUZE/BANDAGES/DRESSINGS) ×2 IMPLANT
SPONGE LAP 18X18 5 PK (GAUZE/BANDAGES/DRESSINGS) ×4 IMPLANT
STAPLER SKIN PROX 35W (STAPLE) ×2 IMPLANT
STRAP SAFETY BODY (MISCELLANEOUS) ×2 IMPLANT
SUCTION FRAZIER TIP 10 FR DISP (SUCTIONS) ×4 IMPLANT
SUT VIC AB 0 CT1 36 (SUTURE) ×2 IMPLANT
SUT VIC AB 1 CT1 36 (SUTURE) ×4 IMPLANT
SUT VIC AB 2-0 CT2 27 (SUTURE) ×2 IMPLANT
SYR 20CC LL (SYRINGE) ×2 IMPLANT
SYR 30ML LL (SYRINGE) ×2 IMPLANT
SYR 50ML LL SCALE MARK (SYRINGE) ×2 IMPLANT
SYSTEM AUTOTRANSFUS DUAL TROCR (MISCELLANEOUS) ×1 IMPLANT
TOWEL OR 17X26 4PK STRL BLUE (TOWEL DISPOSABLE) ×2 IMPLANT
TOWER CARTRIDGE SMART MIX (DISPOSABLE) ×2 IMPLANT
WATER STERILE IRR 1000ML POUR (IV SOLUTION) ×2 IMPLANT
WRAPON POLAR PAD KNEE (MISCELLANEOUS) ×2

## 2014-09-01 NOTE — Telephone Encounter (Signed)
Confirm they would need a new study--- I don't know why? See if he remembers where it was to see if we can even get a copy

## 2014-09-01 NOTE — Anesthesia Procedure Notes (Signed)
Spinal Patient location during procedure: OR Staffing Anesthesiologist: Gunnar Bulla Performed by: anesthesiologist  Preanesthetic Checklist Completed: patient identified, site marked, surgical consent, pre-op evaluation, timeout performed, IV checked and risks and benefits discussed Spinal Block Patient position: sitting Prep: Betadine Patient monitoring: heart rate, cardiac monitor, continuous pulse ox and blood pressure Approach: midline Location: L3-4 Injection technique: single-shot Needle Needle type: Pencil-Tip  Needle gauge: 25 G Needle length: 9 cm Assessment Sensory level: T10 Additional Notes Marcaine 12.5mg  and Tetracaine 5mg .

## 2014-09-01 NOTE — Progress Notes (Addendum)
ANTICOAGULATION CONSULT NOTE - Initial Consult  Pharmacy Consult for warfarin Indication: VTE prophylaxis  Allergies  Allergen Reactions  . Enoxaparin Sodium Hives  . Latex Rash  . Nickel Rash  . Percocet [Oxycodone-Acetaminophen] Rash    Patient Measurements: Height: 5\' 11"  (180.3 cm) Weight: 234 lb (106.142 kg) IBW/kg (Calculated) : 75.3   Vital Signs: Temp: 97.1 F (36.2 C) (08/29 1413) Temp Source: Axillary (08/29 1413) BP: 101/64 mmHg (08/29 1413) Pulse Rate: 66 (08/29 1413)  Labs:  Recent Labs  09/01/14 0704  LABPROT 14.9  INR 1.15    Estimated Creatinine Clearance: 89.7 mL/min (by C-G formula based on Cr of 0.75).   Medical History: Past Medical History  Diagnosis Date  . Chronic prostatitis   . Obstructive sleep apnea     CPAP-9  . Chronic ear infection   . Asthma   . GERD (gastroesophageal reflux disease)     laryngeal involvement  . Hypertension   . Osteoarthrosis, localized, primary, knee     post-traumatic  . Diabetes mellitus   . Tachy-brady syndrome   . Atrial flutter 2007  . Benign prostatic hypertrophy   . Hyperlipidemia   . Band keratopathy   . UTI (urinary tract infection)   . Prostatitis   . Chronic kidney disease   . Stroke     TIA    Medications:  Scheduled:  . acetaminophen  1,000 mg Intravenous 4 times per day  .  ceFAZolin (ANCEF) IV  2 g Intravenous Q6H  . ferrous sulfate  325 mg Oral BID WC  . finasteride  5 mg Oral Daily  . insulin aspart  0-15 Units Subcutaneous TID WC  . loratadine  10 mg Oral Daily  . metoCLOPramide  10 mg Oral TID AC & HS  . [START ON 09/02/2014] multivitamin with minerals   Oral QPC breakfast  . [START ON 09/02/2014] omega-3 acid ethyl esters  1 g Oral Daily  . pantoprazole  40 mg Oral BID  . senna-docusate  1 tablet Oral BID  . tamsulosin  0.4 mg Oral Daily  . [START ON 09/02/2014] triamterene-hydrochlorothiazide  1 capsule Oral q morning - 10a  . [START ON 09/07/2014] warfarin  5 mg Oral Once  per day on Sun  . warfarin  7.5 mg Oral Once per day on Mon Tue Wed Thu Fri Sat  . Warfarin - Pharmacist Dosing Inpatient   Does not apply q1800    Assessment: Patient is an 81 yo male status post total knee replacement.  Pharmacy consulted to start warfarin in patient for VTE prophylaxis.  Based on patient's history, already take warfarin as an outpatient for atrial flutter.  Home dose of warfarin 7.5 mg Monday-Saturday and 5 mg on Sunday.  Warfarin held since 8/24 per notes.  INR upon admission of 1.15.    Of note, INRs have been within range on home dosing.  Last INR on this dose of 2.2 (8/17).   Goal of Therapy:  INR 2-3  Monitor CBC per anticoagulation policy   Plan:  Will continue patient's home dosing of warfarin 7.5 mg po daily on Monday-Saturday and 5 mg on Sunday.  INR ordered in AM.  Pharmacy will continue to follow.  Andretta Ergle G 09/01/2014,2:17 PM

## 2014-09-01 NOTE — Evaluation (Signed)
Physical Therapy Evaluation Patient Details Name: Michael Colon MRN: [id number] DOB: 03-Mar-1930 Today's Date: 09/01/2014   History of Present Illness  Pt is admitted for L TKR. Pt with history of old fracture in L UE, unable to fully extend L elbow, therefore requires platform rw at this time  Clinical Impression  Pt is a pleasant 81 year old male who was admitted for L TKR. Pt performs bed mobility/transfers with min assist and ambulation with cga with platform rw. Pt demonstrates deficits with strength/ROM/ambulation/functional mobility. Pt demonstrates ability to perform 10 SLRs with independence, therefore does not require KI for mobility.  Would benefit from skilled PT to address above deficits and promote optimal return to PLOF. Recommend transition to [place] upon discharge from acute hospitalization.       Follow Up Recommendations Home health PT;Supervision for mobility/OOB    Equipment Recommendations  Rolling walker with 5" wheels (may need platform if pt's is non-adjustable)    Recommendations for Other Services       Precautions / Restrictions Precautions Precautions: Fall;Knee Precaution Booklet Issued: No Restrictions Weight Bearing Restrictions: Yes LLE Weight Bearing: Weight bearing as tolerated      Mobility  Bed Mobility Overal bed mobility: Needs Assistance Bed Mobility: Supine to Sit           General bed mobility comments: supine->sit with min assist for trunk control and cues for sequencing. Once sitting at EOB, pt able to sit with supervision. Additional person available for cga during transfer.  Transfers Overall transfer level: Needs assistance Equipment used: Rolling walker (2 wheeled) Transfers: Sit to/from Stand           General transfer comment: sit<>stand with min assist and platform rw. Bed elevated for ease of transfer. Cues given for sequencing and WB status.   Ambulation/Gait Ambulation/Gait assistance: Min guard Ambulation  Distance (Feet): 3 Feet Assistive device: Left platform walker Gait Pattern/deviations: Step-to pattern     General Gait Details: ambulated with step to gait pattern to recliner with cues for sequencing. Pt with forward flexed posture. Increased pain noted with ambulation distance  Stairs            Wheelchair Mobility    Modified Rankin (Stroke Patients Only)       Balance Overall balance assessment: Needs assistance Sitting-balance support: Bilateral upper extremity supported Sitting balance-Leahy Scale: Good     Standing balance support: Bilateral upper extremity supported Standing balance-Leahy Scale: Fair                               Pertinent Vitals/Pain Pain Assessment: 0-10 Pain Score: 5  Pain Location: L knee Pain Descriptors / Indicators: Constant Pain Intervention(s): Limited activity within patient's tolerance;Ice applied;Utilized relaxation techniques    Home Living Family/patient expects to be discharged to:: Private residence Living Arrangements: Spouse/significant other Available Help at Discharge: Family;Available 24 hours/day Type of Home: House Home Access: Level entry     Home Layout: One level Home Equipment: Cane - quad;Walker - standard      Prior Function Level of Independence: Independent               Hand Dominance        Extremity/Trunk Assessment   Upper Extremity Assessment: Overall WFL for tasks assessed           Lower Extremity Assessment: LLE deficits/detail   LLE Deficits / Details: grossly 3-/5 as pt able to perform most  of SLRs with independence. R LE grossly 5/5     Communication   Communication: No difficulties  Cognition Arousal/Alertness: Lethargic Behavior During Therapy: WFL for tasks assessed/performed Overall Cognitive Status: Within Functional Limits for tasks assessed                      General Comments      Exercises Total Joint Exercises Goniometric ROM:  4-75 degrees L knee AAROM Other Exercises Other Exercises: Pt performed supine ther-ex on L LE with min assist including ankle pumps, quad sets, SLRs, and hip abd/add. Cues given for sequencing and for breathing during ther-ex.      Assessment/Plan    PT Assessment Patient needs continued PT services  PT Diagnosis Difficulty walking;Acute pain   PT Problem List Decreased strength;Decreased range of motion;Decreased activity tolerance;Pain  PT Treatment Interventions DME instruction;Gait training;Functional mobility training;Therapeutic exercise   PT Goals (Current goals can be found in the Care Plan section) Acute Rehab PT Goals Patient Stated Goal: to go home PT Goal Formulation: With patient Time For Goal Achievement: 09/15/14 Potential to Achieve Goals: Good    Frequency BID   Barriers to discharge        Co-evaluation               End of Session Equipment Utilized During Treatment: Gait belt Activity Tolerance: Patient tolerated treatment well Patient left: in chair;with chair alarm set Nurse Communication: Mobility status         Time: 5670-1410 PT Time Calculation (min) (ACUTE ONLY): 33 min   Charges:   PT Evaluation $Initial PT Evaluation Tier I: 1 Procedure PT Treatments $Therapeutic Exercise: 8-22 mins   PT G Codes:        Dezman Granda 09/13/14, 4:45 PM  Greggory Stallion, PT, DPT [phone]

## 2014-09-01 NOTE — Transfer of Care (Signed)
Immediate Anesthesia Transfer of Care Note  Patient: Michael Colon  Procedure(s) Performed: Procedure(s): TOTAL KNEE ARTHROPLASTY (Left)  Patient Location: PACU  Anesthesia Type:Spinal  Level of Consciousness: sedated  Airway & Oxygen Therapy: Patient Spontanous Breathing and Patient connected to face mask oxygen  Post-op Assessment: Report given to RN and Post -op Vital signs reviewed and stable  Post vital signs: Reviewed and stable  Last Vitals:  Filed Vitals:   09/01/14 0618  BP: 154/92  Pulse: 60  Temp: 36.8 C  Resp: 16    Complications: No apparent anesthesia complications

## 2014-09-01 NOTE — Progress Notes (Signed)
   09/01/14 1359  Clinical Encounter Type  Visited With Family  Visit Type Initial  Consult/Referral To Chaplain  Spiritual Encounters  Spiritual Needs Other (Comment)  Stress Factors  Family Stress Factors None identified  Chaplain rounded in the unit and checked in with family and then patient. Chaplain Tarron Krolak A. Laurens Matheny Ext. [phone]

## 2014-09-01 NOTE — Anesthesia Preprocedure Evaluation (Addendum)
Anesthesia Evaluation  Patient identified by MRN, date of birth, ID band Patient awake    Reviewed: Allergy & Precautions, NPO status , Patient's Chart, lab work & pertinent test results, reviewed documented beta blocker date and time   Airway Mallampati: III  TM Distance: >3 FB     Dental  (+) Chipped   Pulmonary asthma , sleep apnea and Continuous Positive Airway Pressure Ventilation , former smoker,          Cardiovascular hypertension, Pt. on medications     Neuro/Psych TIACVA    GI/Hepatic GERD-  ,  Endo/Other  diabetes, Well Controlled, Type 2  Renal/GU Renal InsufficiencyRenal disease     Musculoskeletal  (+) Arthritis -, Osteoarthritis,    Abdominal   Peds  Hematology   Anesthesia Other Findings Pt denies having a stroke. Will use cpap tonite in the hospital.  Reproductive/Obstetrics                          Anesthesia Physical Anesthesia Plan  ASA: III  Anesthesia Plan: Spinal   Post-op Pain Management:    Induction:   Airway Management Planned: Nasal Cannula  Additional Equipment:   Intra-op Plan:   Post-operative Plan:   Informed Consent: I have reviewed the patients History and Physical, chart, labs and discussed the procedure including the risks, benefits and alternatives for the proposed anesthesia with the patient or authorized representative who has indicated his/her understanding and acceptance.     Plan Discussed with: CRNA  Anesthesia Plan Comments:         Anesthesia Quick Evaluation

## 2014-09-01 NOTE — Brief Op Note (Signed)
09/01/2014  12:09 PM  PATIENT:  Michael Colon  81 y.o. male  PRE-OPERATIVE DIAGNOSIS:  DEGENERATIVE OSTEOARTHRITIS LEFT KNEE  POST-OPERATIVE DIAGNOSIS:  DEGENERATIVE OSTEOARTHRITIS LEFT KNEE  PROCEDURE:  Procedure(s): TOTAL KNEE ARTHROPLASTY (Left) using computer-assisted navigation  SURGEON:  Surgeon(s) and Role:    * Dereck Leep, MD - Primary:   ASSISTANTS: Vance Peper, PA    ANESTHESIA:   spinal  EBL:  Total I/O In: 1600 [I.V.:1600] Out: 800 [Urine:250; Blood:550]  BLOOD ADMINISTERED:none  DRAINS: 2 medium drains to reinfusion system   LOCAL MEDICATIONS USED:  MARCAINE    and OTHER Exparel  SPECIMEN:  No Specimen  DISPOSITION OF SPECIMEN:  N/A  COUNTS:  YES  TOURNIQUET:  136 min  DICTATION: .Dragon Dictation  PLAN OF CARE: Admit to inpatient   PATIENT DISPOSITION:  PACU - hemodynamically stable.   Delay start of Pharmacological VTE agent (>24hrs) due to surgical blood loss or risk of bleeding: yes

## 2014-09-01 NOTE — Care Management Note (Signed)
Case Management Note  Patient Details  Name: Michael Colon MRN: [id number] Date of Birth: 1930-06-19  Subjective/Objective:                    Action/Plan: Met with patient, his wife, and his adult son to discuss discharge planning/ Patient will need a front-wheeled walker. He states states he has a standard walker at home with an arm rest which his son will bring in to have applied by PT to new walker. He knows he would like to return home and would like to use [facility] for home health. He is on Coumadin so HHRN will be needed for PT/INRs. He states he is allergic to Lovenox. He uses [facility] [address]. He denies need for bedside commode but his wife feels he will need one.   Referral called to [facility] and [place]. I have also requested rolling walker from [facility] patient decision regarding bedside commode.  RNCM will continue to follow.   Expected Discharge Date:  09/04/14               Expected Discharge Plan:     In-House Referral:     Discharge planning Services  CM Consult  Post Acute Care Choice:  Durable Medical Equipment, Home Health Choice offered to:  Patient, Spouse, Adult Children  DME Arranged:  Walker rolling DME Agency:     HH Arranged:  PT, RN [place] Agency:  [place]  Status of Service:  In process, will continue to follow  Medicare Important Message Given:    Date Medicare IM Given:    Medicare IM give by:    Date Additional Medicare IM Given:    Additional Medicare Important Message give by:     If discussed at [place] of Stay Meetings, dates discussed:    Additional Comments:  Marshell Garfinkel, RN 09/01/2014, 2:41 PM

## 2014-09-01 NOTE — Op Note (Signed)
OPERATIVE NOTE  DATE OF SURGERY:  09/01/2014  PATIENT NAME:  Michael Colon   DOB: 04-11-1930  MRN: [id number]  PRE-OPERATIVE DIAGNOSIS: Degenerative arthrosis of the left knee, post-traumatic  POST-OPERATIVE DIAGNOSIS:  Same  PROCEDURE:  Left total knee arthroplasty using computer-assisted navigation  SURGEON:  Marciano Sequin. M.D.  ASSISTANT:  Vance Peper, PA (present and scrubbed throughout the case, critical for assistance with exposure, retraction, instrumentation, and closure)  ANESTHESIA: spinal  ESTIMATED BLOOD LOSS: 550 mL  FLUIDS REPLACED: 1600 mL of crystalloid  TOURNIQUET TIME: 136 minutes  DRAINS: 2 medium drains to a reinfusion system  SOFT TISSUE RELEASES: Anterior cruciate ligament, posterior cruciate ligament, deep medial collateral ligament, patellofemoral ligament   IMPLANTS UTILIZED: DePuy PFC Sigma size 5 posterior stabilized femoral component (cemented), size 5 MBT tibial component (cemented), 41 mm 3 peg oval dome patella (cemented), and a 10 mm stabilized rotating platform polyethylene insert.  INDICATIONS FOR SURGERY: Michael Colon is a 81 y.o. year old male with a long history of progressive knee pain. He previously underwent open reduction and internal fixation of a tibial plateau fracture with subsequent removal of the hardware. X-rays demonstrated severe degenerative changes in tricompartmental fashion. The patient had not seen any significant improvement despite conservative nonsurgical intervention. After discussion of the risks and benefits of surgical intervention, the patient expressed understanding of the risks benefits and agree with plans for total knee arthroplasty.   The risks, benefits, and alternatives were discussed at length including but not limited to the risks of infection, bleeding, nerve injury, stiffness, blood clots, the need for revision surgery, cardiopulmonary complications, among others, and they were willing to  proceed.  PROCEDURE IN DETAIL: The patient was brought into the operating room and, after adequate spinal anesthesia was achieved, a tourniquet was placed on the patient's upper thigh. The patient's knee and leg were cleaned and prepped with alcohol and DuraPrep and draped in the usual sterile fashion. A "timeout" was performed as per usual protocol. The lower extremity was exsanguinated using an Esmarch, and the tourniquet was inflated to 300 mmHg. An anterior longitudinal incision was made followed by a standard mid vastus approach. The deep fibers of the medial collateral ligament were elevated in a subperiosteal fashion off of the medial flare of the tibia so as to maintain a continuous soft tissue sleeve. The patella was subluxed laterally and the patellofemoral ligament was incised. Inspection of the knee demonstrated severe degenerative changes with full-thickness loss of articular cartilage. Osteophytes were debrided using a rongeur. Anterior and posterior cruciate ligaments were excised. Two 4.0 mm Schanz pins were inserted in the femur and into the tibia for attachment of the array of trackers used for computer-assisted navigation. Hip center was identified using a circumduction technique. Distal landmarks were mapped using the computer. The distal femur and proximal tibia were mapped using the computer. The distal femoral cutting guide was positioned using computer-assisted navigation so as to achieve a 5 distal valgus cut. The femur was sized and it was felt that a size 5 femoral component was appropriate. A size 5 femoral cutting guide was positioned and the anterior cut was performed and verified using the computer. This was followed by completion of the posterior and chamfer cuts. Femoral cutting guide for the central box was then positioned in the center box cut was performed.  Attention was then directed to the proximal tibia. Medial and lateral menisci were excised. The extramedullary tibial  cutting guide was positioned using computer-assisted  navigation so as to achieve a 0 varus-valgus alignment and 0 posterior slope. The cut was performed and verified using the computer. The proximal tibia was sized and it was felt that a size 5 tibial tray was appropriate. Tibial and femoral trials were inserted followed by insertion of a 10 mm polyethylene insert. The knee was felt to be tight both in flexion and extension. The trial components were removed and the tibial cutting guide was repositioned so as to resect an additional 2 mm of bone. Trial components were placed and the knee was placed through a range of motion. This allowed for excellent mediolateral soft tissue balancing both in flexion and in full extension. Finally, the patella was cut and prepared so as to accommodate a 41 mm 3 peg oval dome patella. A patella trial was placed and the knee was placed through a range of motion with excellent patellar tracking appreciated. The femoral trial was removed after debridement of posterior osteophytes. The central post-hole for the tibial component was reamed followed by insertion of a keel punch. Tibial trials were then removed. Cut surfaces of bone were irrigated with copious amounts of normal saline with antibiotic solution using pulsatile lavage and then suctioned dry. Polymethylmethacrylate cement with gentamicin was prepared in the usual fashion using a vacuum mixer. Cement was applied to the cut surface of the proximal tibia as well as along the undersurface of a size 5 MBT tibial component. Tibial component was positioned and impacted into place. Excess cement was removed using [employer] fast streamer. Cement was then applied to the cut surfaces of the femur as well as along the posterior flanges of the size 5 femoral component. The femoral component was positioned and impacted into place. Excess cement was removed using [employer] fast streamer. A 10 mm polyethylene trial was inserted and the knee was brought  into full extension with steady axial compression applied. Finally, cement was applied to the backside of a 41 mm 3 peg oval dome patella and the patellar component was positioned and patellar clamp applied. Excess cement was removed using [employer] fast streamer. After adequate curing of the cement, the tourniquet was deflated after a total tourniquet time of 136 minutes. Hemostasis was achieved using electrocautery. The knee was irrigated with copious amounts of normal saline with antibiotic solution using pulsatile lavage and then suctioned dry. 20 mL of 1.3% Exparel in 40 mL of normal saline was injected along the posterior capsule, medial and lateral gutters, and along the arthrotomy site. A 10 mm stabilized rotating platform polyethylene insert was inserted and the knee was placed through a range of motion with excellent mediolateral soft tissue balancing appreciated and excellent patellar tracking noted. 2 medium drains were placed in the wound bed and brought out through separate stab incisions to be attached to a reinfusion system. The medial parapatellar portion of the incision was reapproximated using interrupted sutures of #1 Vicryl. Subcutaneous tissue was then injected with a total of 30 cc of 0.25% Marcaine with epinephrine. Subcutaneous tissue was approximated in layers using first #0 Vicryl followed #2-0 Vicryl. The skin was approximated with skin staples. A sterile dressing was applied.  The patient tolerated the procedure well and was transported to the recovery room in stable condition.    Keymani Glynn P. Holley Bouche., M.D.

## 2014-09-01 NOTE — H&P (Signed)
The patient has been re-examined, and the chart reviewed, and there have been no interval changes to the documented history and physical.    The risks, benefits, and alternatives have been discussed at length. The patient expressed understanding of the risks benefits and agreed with plans for surgical intervention.  James P. Hooten, Jr. M.D.    

## 2014-09-01 NOTE — Telephone Encounter (Signed)
.  left message to have patient return my call.  

## 2014-09-02 ENCOUNTER — Telehealth: Payer: Self-pay | Admitting: Internal Medicine

## 2014-09-02 LAB — BASIC METABOLIC PANEL
Anion gap: 6 (ref 5–15)
BUN: 19 mg/dL (ref 6–20)
CO2: 25 mmol/L (ref 22–32)
Calcium: 9.3 mg/dL (ref 8.9–10.3)
Chloride: 104 mmol/L (ref 101–111)
Creatinine, Ser: 0.73 mg/dL (ref 0.61–1.24)
GFR calc Af Amer: >60 mL/min (ref >60)
GFR calc non Af Amer: >60 mL/min (ref >60)
Glucose, Bld: 216 mg/dL — ABNORMAL HIGH (ref 65–99)
Potassium: 3.7 mmol/L (ref 3.5–5.1)
Sodium: 135 mmol/L (ref 135–145)

## 2014-09-02 LAB — CBC
HCT: 38.8 % — ABNORMAL LOW (ref 40.0–52.0)
Hemoglobin: 13.2 g/dL (ref 13.0–18.0)
MCH: 31.3 pg (ref 26.0–34.0)
MCHC: 33.9 g/dL (ref 32.0–36.0)
MCV: 92.4 fL (ref 80.0–100.0)
Platelets: 106 10*3/uL — ABNORMAL LOW (ref 150–440)
RBC: 4.2 MIL/uL — ABNORMAL LOW (ref 4.40–5.90)
RDW: 12.8 % (ref 11.5–14.5)
WBC: 12.2 10*3/uL — ABNORMAL HIGH (ref 3.8–10.6)

## 2014-09-02 LAB — PROTIME-INR
INR: 1.26
Prothrombin Time: 16 seconds — ABNORMAL HIGH (ref 11.4–15.0)

## 2014-09-02 LAB — GLUCOSE, CAPILLARY
Glucose-Capillary: 254 mg/dL — ABNORMAL HIGH (ref 65–99)
Glucose-Capillary: 205 mg/dL — ABNORMAL HIGH (ref 65–99)
Glucose-Capillary: 198 mg/dL — ABNORMAL HIGH (ref 65–99)

## 2014-09-02 MED ORDER — TRIAMTERENE-HCTZ 37.5-25 MG PO TABS
1.0000 | ORAL_TABLET | Freq: Every morning | ORAL | Status: DC
  Administered 2014-09-02 – 2014-09-04 (×3): 1 via ORAL
  Filled 2014-09-02 (×3): qty 1

## 2014-09-02 NOTE — Progress Notes (Signed)
   Subjective: 1 Day Post-Op Procedure(s) (LRB): TOTAL KNEE ARTHROPLASTY (Left) Patient reports pain as 4 on 0-10 scale.   Patient is well, and has had no acute complaints or problems We will start therapy today.  Plan is to go Home after hospital stay. no nausea and no vomiting Patient denies any chest pains or shortness of breath. Objective: Vital signs in last 24 hours: Temp:  [97.1 F (36.2 C)-98.4 F (36.9 C)] 98.2 F (36.8 C) (08/30 0434) Pulse Rate:  [25-77] 65 (08/30 0434) Resp:  [13-20] 18 (08/30 0434) BP: (85-127)/(61-91) 127/70 mmHg (08/30 0434) SpO2:  [96 %-100 %] 96 % (08/30 0434) FiO2 (%):  [21 %] 21 % (08/29 1328) Unable to evaluate the wound at this time secondary to being in place from surgery Heels are non tender and elevated off the bed using rolled towels Intake/Output from previous day: 08/29 0701 - 08/30 0700 In: 5061.7 [P.O.:480; I.V.:3256.7; Blood:775; IV Piggyback:350] Out: 2135 [Urine:1575; Drains:10; Blood:550] Intake/Output this shift:     Recent Labs  09/02/14 0648  HGB 13.2    Recent Labs  09/02/14 0648  WBC 12.2*  RBC 4.20*  HCT 38.8*  PLT 106*    Recent Labs  09/02/14 0648  NA 135  K 3.7  CL 104  CO2 25  BUN 19  CREATININE 0.73  GLUCOSE 216*  CALCIUM 9.3    Recent Labs  09/01/14 0704 09/02/14 0648  INR 1.15 1.26    EXAM General - Patient is Alert, Appropriate and Oriented Extremity - Neurologically intact Neurovascular intact Sensation intact distally Intact pulses distally Dorsiflexion/Plantar flexion intact Dressing - dressing C/D/I Motor Function - intact, moving foot and toes well on exam. Does have good quad tone but still able to do a straight leg raise completely on his own  Past Medical History  Diagnosis Date  . Chronic prostatitis   . Obstructive sleep apnea     CPAP-9  . Chronic ear infection   . Asthma   . GERD (gastroesophageal reflux disease)     laryngeal involvement  . Hypertension    . Osteoarthrosis, localized, primary, knee     post-traumatic  . Diabetes mellitus   . Tachy-brady syndrome   . Atrial flutter 2007  . Benign prostatic hypertrophy   . Hyperlipidemia   . Band keratopathy   . UTI (urinary tract infection)   . Prostatitis   . Chronic kidney disease   . Stroke     TIA    Assessment/Plan: 1 Day Post-Op Procedure(s) (LRB): TOTAL KNEE ARTHROPLASTY (Left) Active Problems:   Total knee replacement status  Estimated body mass index is 32.65 kg/(m^2) as calculated from the following:   Height as of this encounter: 5\' 11"  (1.803 m).   Weight as of this encounter: 106.142 kg (234 lb). Advance diet Up with therapy D/C IV fluids Discharge home with home health on Thursday  Labs: Were reviewed. INR 1.26 DVT Prophylaxis - Coumadin, Foot Pumps and TED hose Weight-Bearing as tolerated to left leg Begin working on having a bowel movement D/C O2 and Pulse OX and try on Room [employer] R. Kenly Southgate 09/02/2014, 7:16 AM

## 2014-09-02 NOTE — Telephone Encounter (Signed)
Pt spouse dropped of sleep study for Michael Colon.  Sleep study in Dr. Silvio Pate INbox

## 2014-09-02 NOTE — Progress Notes (Signed)
Clinical Social Worker (CSW) received SNF consult. PT is recommending home health. RN Case Manager aware of above. Please reconsult if future social work needs arise. CSW signing off.   Wilma Michaelson Morgan, LCSWA [phone] 

## 2014-09-02 NOTE — Progress Notes (Signed)
ANTICOAGULATION CONSULT NOTE - FOLLOW UP   Pharmacy Consult for warfarin Indication: VTE prophylaxis  Allergies  Allergen Reactions  . Enoxaparin Sodium Hives  . Latex Rash  . Nickel Rash  . Percocet [Oxycodone-Acetaminophen] Rash    Patient Measurements: Height: 5\' 11"  (180.3 cm) Weight: 234 lb (106.142 kg) IBW/kg (Calculated) : 75.3   Vital Signs: Temp: 97.7 F (36.5 C) (08/30 0758) Temp Source: Oral (08/30 0758) BP: 117/59 mmHg (08/30 0758) Pulse Rate: 62 (08/30 0758)  Labs:  Recent Labs  09/01/14 0704 09/02/14 0648  HGB  --  13.2  HCT  --  38.8*  PLT  --  106*  LABPROT 14.9 16.0*  INR 1.15 1.26  CREATININE  --  0.73    Estimated Creatinine Clearance: 89.7 mL/min (by C-G formula based on Cr of 0.73).   Medical History: Past Medical History  Diagnosis Date  . Chronic prostatitis   . Obstructive sleep apnea     CPAP-9  . Chronic ear infection   . Asthma   . GERD (gastroesophageal reflux disease)     laryngeal involvement  . Hypertension   . Osteoarthrosis, localized, primary, knee     post-traumatic  . Diabetes mellitus   . Tachy-brady syndrome   . Atrial flutter 2007  . Benign prostatic hypertrophy   . Hyperlipidemia   . Band keratopathy   . UTI (urinary tract infection)   . Prostatitis   . Chronic kidney disease   . Stroke     TIA    Medications:  Scheduled:  . acetaminophen  1,000 mg Intravenous 4 times per day  .  ceFAZolin (ANCEF) IV  2 g Intravenous Q6H  . ferrous sulfate  325 mg Oral BID WC  . finasteride  5 mg Oral Daily  . insulin aspart  0-15 Units Subcutaneous TID WC  . loratadine  10 mg Oral Daily  . metoCLOPramide  10 mg Oral TID AC & HS  . multivitamin with minerals   Oral QPC breakfast  . omega-3 acid ethyl esters  1 g Oral Daily  . pantoprazole  40 mg Oral BID  . senna-docusate  1 tablet Oral BID  . tamsulosin  0.4 mg Oral Daily  . triamterene-hydrochlorothiazide  1 tablet Oral q morning - 10a  . [START ON 09/07/2014]  warfarin  5 mg Oral Once per day on Sun  . warfarin  7.5 mg Oral Once per day on Mon Tue Wed Thu Fri Sat  . Warfarin - Pharmacist Dosing Inpatient   Does not apply q1800    Assessment: Patient is an 81 yo male status post total knee replacement.  Pharmacy consulted to start warfarin in patient for VTE prophylaxis.  Based on patient's history, already take warfarin as an outpatient for atrial flutter.  Home dose of warfarin 7.5 mg Monday-Saturday and 5 mg on Sunday.  Warfarin held since 8/24 per notes.  INR upon admission of 1.15.    Of note, INRs have been within range on home dosing.  Last INR on this dose of 2.2 (8/17).   Goal of Therapy:  INR 2-3  Monitor CBC per anticoagulation policy   Plan:  Will continue patient's home dosing of warfarin 7.5 mg po daily on Monday-Saturday and 5 mg on Sunday.  INR ordered in AM.  Pharmacy will continue to follow.  Caroline Matters D 09/02/2014,8:13 AM

## 2014-09-02 NOTE — Anesthesia Postprocedure Evaluation (Signed)
  Anesthesia Post-op Note  Patient: Michael Colon  Procedure(s) Performed: Procedure(s): TOTAL KNEE ARTHROPLASTY (Left)  Anesthesia type:Spinal  Patient location: PACU  Post pain: Pain level controlled  Post assessment: Post-op Vital signs reviewed, Patient's Cardiovascular Status Stable, Respiratory Function Stable, Patent Airway and No signs of Nausea or vomiting  Post vital signs: Reviewed and stable  Last Vitals:  Filed Vitals:   09/02/14 0758  BP: 117/59  Pulse: 62  Temp: 36.5 C  Resp: 18    Level of consciousness: awake, alert  and patient cooperative  Complications: No apparent anesthesia complications

## 2014-09-02 NOTE — Progress Notes (Signed)
Pt remaining alert and oriented. Minimal pain this shift. Refusing pain medication early this morning stating that he is not having any pain. Offered pain medication multiple time through the shift and pt declining. Iv infusing without difficulty. Foley patent and draining urine. Dressing remaining dry and intact. Neuro checks wdl.

## 2014-09-02 NOTE — Plan of Care (Signed)
Problem: Phase I Progression Outcomes Goal: Pain controlled with appropriate interventions Outcome: Completed/Met Date Met:  09/02/14 Pt having minimal pain control with oral pain medication Goal: Dangle or out of bed evening of surgery Outcome: Completed/Met Date Met:  09/02/14 Up in chair evening of surgery

## 2014-09-02 NOTE — Evaluation (Signed)
Occupational Therapy Evaluation Patient Details Name: NARESH ALTHAUS MRN: [id number] DOB: 08-18-1930 Today's Date: 09/02/2014    History of Present Illness This patient is an 81 year old male who came to [facility] for a L TKR.   Clinical Impression   This patient is an 81 year old male who came to [facility] for a L total knee replacement.  Patient lives in a home with his wife.  He had been independent with ADL and functional mobility. He now requires  assistance and would benefit from Occupational Therapy for ADL/functioal mobility training.      Follow Up Recommendations       Equipment Recommendations       Recommendations for Other Services       Precautions / Restrictions Precautions Precautions: Fall;Knee Precaution Booklet Issued: No Restrictions Weight Bearing Restrictions: Yes LLE Weight Bearing: Weight bearing as tolerated      Mobility Bed Mobility  Transfers    Balance                                          ADL                                         General ADL Comments: Patient had been independent with his ADL.  Today practiced lower body dressing techniques using hip kit, Donned/doffed socks and pants to knees (drain still in place) with verbal cues, and minimal assist.      Vision     Perception     Praxis      Pertinent Vitals/Pain Pain Assessment: 0-10 Pain Score: 4  Pain Location: L knee Pain Intervention(s): Limited activity within patient's tolerance;Monitored during session;Premedicated before session;Ice applied     Hand Dominance     Extremity/Trunk Assessment Upper Extremity Assessment Upper Extremity Assessment: Overall WFL for tasks assessed (Left elbow lacks 80 degrees of extension.)   Lower Extremity Assessment Lower Extremity Assessment: Defer to PT evaluation       Communication Communication Communication: No difficulties   Cognition Arousal/Alertness:  Awake/alert Behavior During Therapy: WFL for tasks assessed/performed Overall Cognitive Status: Within Functional Limits for tasks assessed                     General Comments       Exercises   Shoulder Instructions      Home Living Family/patient expects to be discharged to:: Private residence Living Arrangements: Spouse/significant other Available Help at Discharge: Family;Available 24 hours/day Type of Home: House Home Access: Level entry     Home Layout: One level               Home Equipment: Cane - quad;Walker - standard          Prior Functioning/Environment Level of Independence: Independent             OT Diagnosis: Acute pain   OT Problem List:     OT Treatment/Interventions: Self-care/ADL training    OT Goals(Current goals can be found in the care plan section) Acute Rehab OT Goals Patient Stated Goal: To do well OT Goal Formulation: With patient/family Time For Goal Achievement: 09/16/14 Potential to Achieve Goals: Good  OT Frequency: Min 1X/week   Barriers to D/C:  Co-evaluation              End of Session Equipment Utilized During Treatment:  (Hip kit)  Activity Tolerance: Patient tolerated treatment well Patient left: in chair;with call bell/phone within reach;with chair alarm set   Time: 5848-3507 OT Time Calculation (min): 31 min Charges:    G-Codes:    Myrene Galas, MS/OTR/L  09/02/2014, 2:42 PM

## 2014-09-02 NOTE — Telephone Encounter (Signed)
Please send that along per his Rx for new supplies He is in the hospital now after elective TKR

## 2014-09-02 NOTE — Telephone Encounter (Signed)
Copy of study faxed back and scanned into pt's chart.

## 2014-09-02 NOTE — Progress Notes (Signed)
Inpatient Diabetes Program Recommendations  AACE/ADA: New Consensus Statement on Inpatient Glycemic Control (2013)  Target Ranges:  Prepandial:   less than 140 mg/dL      Peak postprandial:   less than 180 mg/dL (1-2 hours)      Critically ill patients:  140 - 180 mg/dL   Results for HORATIO, BERTZ (MRN [id number]) as of 09/02/2014 10:22  Ref. Range 09/01/2014 13:00 09/01/2014 16:27 09/01/2014 20:31 09/02/2014 06:48 09/02/2014 07:57  Glucose-Capillary Latest Ref Range: 65-99 mg/dL 183 (H) 215 (H) 253 (H)  254 (H)   A1C- 7.8% on 08/20/14  Reason for Visit: left knee arthroscopy  Diabetes history: Type 2 Outpatient Diabetes medications: none Current orders for Inpatient glycemic control: Novolog 0-15 units tid with meals  Fasting blood sugar 254mg /dl this am.  Consider starting him on 11 units Lantus qhs (0.1unit/kg) beginning tonight.  On discharge, patient should follow up with primary care to consider starting medication to manage blood sugars.   Gentry Fitz, RN, BA, MHA, CDE Diabetes Coordinator Inpatient Diabetes Program  [phone] (Team Pager) [phone] ([place]) 09/02/2014 10:26 AM

## 2014-09-02 NOTE — Patient Outreach (Signed)
[facility]) Care Management  09/02/2014  Michael Colon 06/17/1930 [id number]   Inpatient at [facility], assigned Michael Minor, RN to outreach.  Thanks, Ronnell Freshwater. [place], [place] Assistant Phone: [phone] Fax: [phone]

## 2014-09-02 NOTE — Progress Notes (Signed)
Physical Therapy Treatment Patient Details Name: Michael Colon MRN: [id number] DOB: 1930-02-26 Today's Date: 09/02/2014    History of Present Illness This patient is an 81 year old male who came to [facility] for a L TKR.    PT Commentss   Pt with greater c/o pain this afternoon but still able to ambulate greater distance and performs bed mobility and transfers within his tolerance. He still has acute pain, ROM, strength, and mobility deficits that will benefit from skilled PT in order for him to return home safely. Pt is motivated to participate in therapy even though he has some discomfort. Nurse notified of pain.   Follow Up Recommendations  Home health PT;Supervision for mobility/OOB     Equipment Recommendations  Rolling walker with 5" wheels (Platform (L side))    Recommendations for Other Services       Precautions / Restrictions Precautions Precautions: Fall;Knee Precaution Booklet Issued: Yes (comment) (Exercise packet issued) Restrictions Weight Bearing Restrictions: Yes LLE Weight Bearing: Weight bearing as tolerated    Mobility  Bed Mobility Overal bed mobility: Needs Assistance Bed Mobility: Sit to Supine     Supine to sit: Supervision Sit to supine: Supervision   General bed mobility comments: Pt with good functional strength and use of arms and handrails to get to EOB. Requires supervision for protection and direction of knee. Pt able to control LLE with good speed and descent   Transfers Overall transfer level: Needs assistance Equipment used: Rolling walker (2 wheeled) Transfers: Sit to/from Stand Sit to Stand: Min assist         General transfer comment:  (Transfer requires slightly more assist due to pain)  Ambulation/Gait Ambulation/Gait assistance: Min guard Ambulation Distance (Feet): 70 Feet Assistive device: Left platform walker Gait Pattern/deviations: Step-to pattern;Step-through pattern;Decreased step length - right;Decreased step length  - left Gait velocity: decreased   General Gait Details: Pt needs cues for symmetrical gait pattern as well as cues for keeping the walker closer to his COM. Pt starts ambulation in step-to pattern but with cueing and repetition he can perform step-through pattern, but needs reinforcement (Greater endurance this PM regardless of pain)   Stairs            Wheelchair Mobility    Modified Rankin (Stroke Patients Only)       Balance Overall balance assessment: No apparent balance deficits (not formally assessed)                                  Cognition Arousal/Alertness: Awake/alert Behavior During Therapy: WFL for tasks assessed/performed Overall Cognitive Status: Within Functional Limits for tasks assessed                      Exercises Total Joint Exercises Goniometric ROM: L knee AAROM: 3 - 83 degrees Other Exercises Other Exercises: Pt performed therex x10 reps in supine and sitting on bilateral LEs with supervision for proper technique, and occasional min assist. Exercises performed: ankle pumps, glute sets, quad sets, heel slides, SLR, hip abd/add (min assist).    General Comments        Pertinent Vitals/Pain Pain Assessment: 0-10 Pain Score: 6  Pain Location: L knee Pain Intervention(s): Limited activity within patient's tolerance;Monitored during session;Ice applied    Home Living Family/patient expects to be discharged to:: Private residence Living Arrangements: Spouse/significant other Available Help at Discharge: Family;Available 24 hours/day Type of Home: [place]  Access: Level entry   Home Layout: One level Home Equipment: Cane - quad;Walker - standard      Prior Function Level of Independence: Independent          PT Goals (current goals can now be found in the care plan section) Acute Rehab PT Goals Patient Stated Goal: to try to walk PT Goal Formulation: With patient Time For Goal Achievement: 09/15/14 Potential  to Achieve Goals: Good Progress towards PT goals: Progressing toward goals    Frequency  BID    PT Plan Current plan remains appropriate    Co-evaluation             End of Session Equipment Utilized During Treatment: Gait belt Activity Tolerance: Patient tolerated treatment well Patient left: in bed;with call bell/phone within reach;with bed alarm set;with SCD's reapplied     Time: 3007-6226 PT Time Calculation (min) (ACUTE ONLY): 25 min  Charges:  $Gait Training: 8-22 mins $Therapeutic Exercise: 8-22 mins                    G CodesJanyth Contes 09-11-2014, 3:25 PM  Janyth Contes, SPT. [phone]

## 2014-09-02 NOTE — Progress Notes (Signed)
Physical Therapy Treatment Patient Details Name: Michael Colon MRN: [id number] DOB: 14-Aug-1930 Today's Date: 09/02/2014    History of Present Illness Pt is admitted for L TKR. Pt with history of old fracture in L UE, unable to fully extend L elbow, therefore requires platform rw at this time    PT Comments    Pt with relatively little reported pain and good performance in therapy this morning. He is ambulating greater distances and is becoming more independent with bed mobility/transfers. Pt still needs cueing for proper safety practices with transfers and ambulation, but he demonstrates learning from yesterday's session. He will continue to benefit from skilled PT in order to address his strength, ROM, and mobility deficits in order for him to return home safely.   Follow Up Recommendations  Home health PT;Supervision for mobility/OOB     Equipment Recommendations  Rolling walker with 5" wheels    Recommendations for Other Services       Precautions / Restrictions Precautions Precautions: Fall;Knee Precaution Booklet Issued: No Restrictions Weight Bearing Restrictions: Yes LLE Weight Bearing: Weight bearing as tolerated    Mobility  Bed Mobility Overal bed mobility: Needs Assistance Bed Mobility: Supine to Sit     Supine to sit: Supervision     General bed mobility comments: Pt with good functional strength and use of arms and handrails to get to EOB. Requires supervision for protection and direction of knee. Pt able to control LLE with good speed and descent   Transfers Overall transfer level: Needs assistance Equipment used: Rolling walker (2 wheeled) Transfers: Sit to/from Stand Sit to Stand: Min assist         General transfer comment: Pt needs cues to prop LLE out prior to transfer. Needs assist to get into full standing and cues for hand placement during transfers  Ambulation/Gait Ambulation/Gait assistance: Min guard Ambulation Distance (Feet): 40  Feet Assistive device: Left platform walker Gait Pattern/deviations: Step-to pattern;Step-through pattern;Decreased step length - right;Decreased step length - left;Decreased stride length Gait velocity: decreased   General Gait Details: Pt needs cues for symmetrical gait pattern as well as cues for keeping the walker closer to his COM. Pt starts ambulation in step-to pattern but with cueing and repetition he can perform step-through pattern, but needs reinforcement   Stairs            Wheelchair Mobility    Modified Rankin (Stroke Patients Only)       Balance Overall balance assessment: No apparent balance deficits (not formally assessed)                                  Cognition Arousal/Alertness: Awake/alert Behavior During Therapy: WFL for tasks assessed/performed Overall Cognitive Status: Within Functional Limits for tasks assessed                      Exercises Total Joint Exercises Goniometric ROM: L knee AAROM: 3 - 83 degrees Other Exercises Other Exercises: Pt performed therex in supine on bilateral LEs with supervision for proper technique, and occasional min assist. Exercises performed: ankle pumps, glute sets, quad sets, heel slides, SLR, hip abd/add (min assist).    General Comments        Pertinent Vitals/Pain Pain Assessment: 0-10 Pain Score: 4  Pain Location: L knee Pain Intervention(s): Limited activity within patient's tolerance;Monitored during session;Premedicated before session;Ice applied    Home Living  Prior Function            PT Goals (current goals can now be found in the care plan section) Acute Rehab PT Goals Patient Stated Goal: to participate in therapy PT Goal Formulation: With patient Time For Goal Achievement: 09/15/14 Potential to Achieve Goals: Good Progress towards PT goals: Progressing toward goals    Frequency  BID    PT Plan Current plan remains appropriate     Co-evaluation             End of Session Equipment Utilized During Treatment: Gait belt Activity Tolerance: Patient tolerated treatment well Patient left: in chair;with chair alarm set;with SCD's reapplied     Time: 0940-1003 PT Time Calculation (min) (ACUTE ONLY): 23 min  Charges:                       G CodesJanyth Contes 09/12/2014, 12:00 PM  Janyth Contes, SPT. [phone]

## 2014-09-03 LAB — GLUCOSE, CAPILLARY
Glucose-Capillary: 224 mg/dL — ABNORMAL HIGH (ref 65–99)
Glucose-Capillary: 278 mg/dL — ABNORMAL HIGH (ref 65–99)
Glucose-Capillary: 257 mg/dL — ABNORMAL HIGH (ref 65–99)
Glucose-Capillary: 184 mg/dL — ABNORMAL HIGH (ref 65–99)

## 2014-09-03 LAB — CBC
HCT: 37.4 % — ABNORMAL LOW (ref 40.0–52.0)
Hemoglobin: 12.8 g/dL — ABNORMAL LOW (ref 13.0–18.0)
MCH: 31.4 pg (ref 26.0–34.0)
MCHC: 34.2 g/dL (ref 32.0–36.0)
MCV: 91.7 fL (ref 80.0–100.0)
Platelets: 99 10*3/uL — ABNORMAL LOW (ref 150–440)
RBC: 4.08 MIL/uL — ABNORMAL LOW (ref 4.40–5.90)
RDW: 12.5 % (ref 11.5–14.5)
WBC: 10.7 10*3/uL — ABNORMAL HIGH (ref 3.8–10.6)

## 2014-09-03 LAB — PROTIME-INR
INR: 1.35
Prothrombin Time: 16.9 seconds — ABNORMAL HIGH (ref 11.4–15.0)

## 2014-09-03 MED ORDER — HYDROCODONE-ACETAMINOPHEN 5-325 MG PO TABS
1.0000 | ORAL_TABLET | ORAL | Status: DC | PRN
  Administered 2014-09-04 (×3): 1 via ORAL
  Filled 2014-09-03: qty 1
  Filled 2014-09-03: qty 2
  Filled 2014-09-03: qty 1

## 2014-09-03 MED ORDER — PHENAZOPYRIDINE HCL 100 MG PO TABS
100.0000 mg | ORAL_TABLET | Freq: Three times a day (TID) | ORAL | Status: AC
  Administered 2014-09-03 (×3): 100 mg via ORAL
  Filled 2014-09-03 (×3): qty 1

## 2014-09-03 NOTE — Progress Notes (Signed)
Assisted pt to bathroom and back to bed  no BM

## 2014-09-03 NOTE — Clinical Social Work Note (Signed)
Clinical Social Work Assessment  Patient Details  Name: Michael Colon MRN: [id number] Date of Birth: 07-Oct-1930  Date of referral:  09/03/14               Reason for consult:  Facility Placement                Permission sought to share information with:  Facility Art therapist granted to share information::  Yes, Verbal Permission Granted  Name::        Agency::     Relationship::     Contact Information:     Housing/Transportation Living arrangements for the past 2 months:  [place] of Information:  Patient Patient Interpreter Needed:  None Criminal Activity/Legal Involvement Pertinent to Current Situation/Hospitalization:  No - Comment as needed Significant Relationships:  Spouse Lives with:  Spouse Do you feel safe going back to the place where you live?  Yes Need for family participation in patient care:  No (Coment)  Care giving concerns:  Patient resides with his wife.   Social Worker assessment / plan:  CSW met with patient regarding recommendations for STR. Patient is a resident at [place] and will have a bed reserved at [facility]. Patient states that he and his wife wanted to make plans for him to return home however, patient states that his wife needs her walker to ambulate and to get herself up from a seated position. He stated that after they thought about it, that they decided it was best for him to go to the healthcare building for rehab. CSW inquired if his wife would be okay alone and patient stated that he believes she will do fine. Patient is looking forward to completing his rehab and then returning home with his wife. CSW informed that patient may discharge tomorrow and has relayed this to the patient who is in agreement with this.  Employment status:  Retired Forensic scientist:  Medicare PT Recommendations:  [place] / Referral to community  resources:  [place]  Patient/Family's Response to care:  Patient is agreeable to [facility] and expressed appreciation for CSW assistance.  Patient/Family's Understanding of and Emotional Response to Diagnosis, Current Treatment, and Prognosis:  Patient has good insight in regards to his abilities at this time and is making a decision that he believes is in his best interest.  Emotional Assessment Appearance:  Appears younger than stated age Attitude/Demeanor/Rapport:   (pleasant and cooperative) Affect (typically observed):  Accepting, Adaptable, Appropriate Orientation:  Oriented to Self, Oriented to Place, Oriented to  Time, Oriented to Situation Alcohol / Substance use:  Not Applicable Psych involvement (Current and /or in the community):  No (Comment)  Discharge Needs  Concerns to be addressed:  Care Coordination Readmission within the last 30 days:  No Current discharge risk:  None Barriers to Discharge:  No Barriers Identified   Shela Leff, LCSW 09/03/2014, 2:45 PM

## 2014-09-03 NOTE — Progress Notes (Signed)
Inpatient Diabetes Program Recommendations  AACE/ADA: New Consensus Statement on Inpatient Glycemic Control (2013)  Target Ranges:  Prepandial:   less than 140 mg/dL      Peak postprandial:   less than 180 mg/dL (1-2 hours)      Critically ill patients:  140 - 180 mg/dL   Inpatient Diabetes Program Recommendations Insulin - Basal: add Lantus 20 units  Thank you  Shantoya Geurts BSN, RN,CDE Inpatient Diabetes Coordinator [phone] (team pager)    

## 2014-09-03 NOTE — Progress Notes (Signed)
ANTICOAGULATION CONSULT NOTE - Follow Up Consult  Pharmacy Consult for Warfarin Indication: VTE prophylaxis/HX of A. Flutter  Allergies  Allergen Reactions  . Enoxaparin Sodium Hives  . Latex Rash  . Nickel Rash  . Percocet [Oxycodone-Acetaminophen] Rash    Patient Measurements: Height: 5\' 11"  (180.3 cm) Weight: 234 lb (106.142 kg) IBW/kg (Calculated) : 75.3   Vital Signs: Temp: 98 F (36.7 C) (08/31 0730) Temp Source: Oral (08/31 0730) BP: 153/77 mmHg (08/31 0730) Pulse Rate: 70 (08/31 0730)  Labs:  Recent Labs  09/01/14 0704 09/02/14 0648 09/03/14 0608  HGB  --  13.2 12.8*  HCT  --  38.8* 37.4*  PLT  --  106* 99*  LABPROT 14.9 16.0* 16.9*  INR 1.15 1.26 1.35  CREATININE  --  0.73  --     Estimated Creatinine Clearance: 89.7 mL/min (by C-G formula based on Cr of 0.73).   Medications:  Prescriptions prior to admission  Medication Sig Dispense Refill Last Dose  . BAYER CONTOUR TEST test strip USE STRIP AS INSTRUCTED TO TEST BLOOD SUGAR TWICE DAILY 100 each 11 08/31/2014 at Unknown time  . docusate sodium (COLACE) 100 MG capsule Take 100 mg by mouth 2 (two) times daily.    08/31/2014 at Unknown time  . famotidine (PEPCID) 20 MG tablet Take 20 mg by mouth daily.    08/31/2014 at Unknown time  . finasteride (PROSCAR) 5 MG tablet TAKE ONE TABLET BY MOUTH ONCE DAILY 90 tablet 2 08/31/2014 at Unknown time  . fish oil-omega-3 fatty acids 1000 MG capsule Take 1 g by mouth daily.    Past Week at Unknown time  . Glucosamine Sulfate-MSM (GLUCOSAMINE-MSM DS) 500-500 MG TABS Take 1,500 mg by mouth 2 (two) times daily.    Past Week at Unknown time  . glucose blood test strip Use as instructed to test blood sugar once daily dx: 250.00   08/31/2014 at Unknown time  . ibuprofen (ADVIL,MOTRIN) 200 MG tablet Take 400 mg by mouth 2 (two) times daily.   08/20/2014 at Unknown time  . loratadine (CLARITIN) 10 MG tablet Take 10 mg by mouth daily.     08/31/2014 at Unknown time  . MICROLET  LANCETS MISC Use to test twice daily or as directed 100 each 3 08/31/2014 at Unknown time  . Multiple Vitamins-Minerals (CENTRUM SILVER PO) Take 1 tablet by mouth daily after breakfast.    08/31/2014 at Unknown time  . tamsulosin (FLOMAX) 0.4 MG CAPS capsule Take 1 capsule (0.4 mg total) by mouth daily. 90 capsule 3 08/31/2014 at Unknown time  . triamterene-hydrochlorothiazide (DYAZIDE) 37.5-25 MG per capsule TAKE ONE CAPSULE BY MOUTH IN THE MORNING 90 capsule 3 08/31/2014 at Unknown time  . warfarin (COUMADIN) 5 MG tablet Take 5-7.5 mg by mouth See admin instructions. Take 7.5mg  on Monday, Tuesday, Wednesday, Thursday, Friday and Saturday. Take 5mg  on Sunday   08/27/2014  . nitroGLYCERIN (NITROSTAT) 0.4 MG SL tablet Place 0.4 mg under the tongue every 5 (five) minutes as needed for chest pain.   Not Taking at Unknown time    Assessment:  Patient is an 81yo male status post knee replacement, Day 3 of warfarin therapy. He does have a history of A.Flutter and was taking warfarin outpatient. Patients out patient regimen was 7.5mg  daily, except Sunday he was taking 5mg . His INR was with in goal (2-3) prior to holding warfarin for surgery. Patient's INR today is 1.35, and increase from 1.26 yesterday.    Goal of Therapy:  INR  2-3 Monitor CBC per anticoagulation policy.    Plan:  Will continue patient's home dose on 7.5, based on INR improvement and history of being stable on the 7.5mg  dose. INR ordered for AM, will follow up tomorrow.  Nancy Fetter, PharmD Pharmacy Resident

## 2014-09-03 NOTE — Clinical Social Work Placement (Signed)
   CLINICAL SOCIAL WORK PLACEMENT  NOTE  Date:  09/03/2014  Patient Details  Name: RHETT MUTSCHLER MRN: [id number] Date of Birth: 08/12/1930  Clinical Social Work is seeking post-discharge placement for this patient at the [place] level of care (*CSW will initial, date and re-position this form in  chart as items are completed):  Yes   Patient/family provided with [place] Work Department's list of facilities offering this level of care within the geographic area requested by the patient (or if unable, by the patient's family).  Yes   Patient/family informed of their freedom to choose among providers that offer the needed level of care, that participate in Medicare, Medicaid or managed care program needed by the patient, have an available bed and are willing to accept the patient.  Yes   Patient/family informed of [place]'s ownership interest in [facility] and [facility], as well as of the fact that they are under no obligation to receive care at these facilities.  PASRR submitted to EDS on 09/03/14     PASRR number received on 09/03/14     Existing PASRR number confirmed on       FL2 transmitted to all facilities in geographic area requested by pt/family on 09/03/14     FL2 transmitted to all facilities within larger geographic area on       Patient informed that his/her managed care company has contracts with or will negotiate with certain facilities, including the following:   (Patient is a resident of [place] and thus has a bed reserved at [facility])     Yes   Patient/family informed of bed offers received.  Patient [facility] bed at  [facility])     Physician recommends and patient chooses bed at  [facility]    Patient to be transferred to   on  .  Patient to be transferred to facility by       Patient family notified on   of transfer.  Name of family member notified:         PHYSICIAN Please sign FL2     Additional Comment:    _______________________________________________ Loralyn Freshwater, LCSW 09/03/2014, 5:38 PM

## 2014-09-03 NOTE — Care Management Important Message (Signed)
Important Message  Patient Details  Name: Michael Colon MRN: [id number] Date of Birth: 05-06-1930   Medicare Important Message Given:  Yes-second notification given    [name] 09/03/2014, 11:45 AM

## 2014-09-03 NOTE — Progress Notes (Signed)
   Subjective: 2 Days Post-Op Procedure(s) (LRB): TOTAL KNEE ARTHROPLASTY (Left) Patient reports pain as mild.   Patient is well, and has had no acute complaints or problems We'll continue with physical  therapy today.  Plan is to go Home after hospital stay. no nausea and no vomiting Patient complained of increase urinary frequency. Patient denies any chest pains or shortness of breath. Objective: Vital signs in last 24 hours: Temp:  [97.7 F (36.5 C)-98 F (36.7 C)] 97.7 F (36.5 C) (08/31 0404) Pulse Rate:  [62-66] 65 (08/31 0404) Resp:  [18-19] 19 (08/31 0404) BP: (117-155)/(59-82) 145/79 mmHg (08/31 0404) SpO2:  [96 %-97 %] 96 % (08/31 0404) well approximated incision Heels are non tender and elevated off the bed using rolled towels Intake/Output from previous day: 08/30 0701 - 08/31 0700 In: 2695 [P.O.:240; I.V.:2455] Out: 1610 [Urine:1825; Drains:20] Intake/Output this shift: Total I/O In: 2455 [I.V.:2455] Out: 9604 [Urine:1425; Drains:20]   Recent Labs  09/02/14 0648 09/03/14 0608  HGB 13.2 12.8*    Recent Labs  09/02/14 0648 09/03/14 0608  WBC 12.2* 10.7*  RBC 4.20* 4.08*  HCT 38.8* 37.4*  PLT 106* 99*    Recent Labs  09/02/14 0648  NA 135  K 3.7  CL 104  CO2 25  BUN 19  CREATININE 0.73  GLUCOSE 216*  CALCIUM 9.3    Recent Labs  09/02/14 0648 09/03/14 0608  INR 1.26 1.35    EXAM General - Patient is Alert, Appropriate and Oriented Extremity - Neurologically intact Neurovascular intact Sensation intact distally Intact pulses distally Dressing - scant drainage however the area around the Hemovac was saturated with blood. Appear to be leaking around the Hemovac. Motor Function - intact, moving foot and toes well on exam. Still not able to do a straight leg raise on his own.  Past Medical History  Diagnosis Date  . Chronic prostatitis   . Obstructive sleep apnea     CPAP-9  . Chronic ear infection   . Asthma   . GERD  (gastroesophageal reflux disease)     laryngeal involvement  . Hypertension   . Osteoarthrosis, localized, primary, knee     post-traumatic  . Diabetes mellitus   . Tachy-brady syndrome   . Atrial flutter 2007  . Benign prostatic hypertrophy   . Hyperlipidemia   . Band keratopathy   . UTI (urinary tract infection)   . Prostatitis   . Chronic kidney disease   . Stroke     TIA    Assessment/Plan: 2 Days Post-Op Procedure(s) (LRB): TOTAL KNEE ARTHROPLASTY (Left) Active Problems:   Total knee replacement status  Estimated body mass index is 32.65 kg/(m^2) as calculated from the following:   Height as of this encounter: 5\' 11"  (1.803 m).   Weight as of this encounter: 106.142 kg (234 lb). Up with therapy Plan for discharge tomorrow Discharge home with home health  Labs: Were reviewed on today's visit DVT Prophylaxis - Coumadin Weight-Bearing as tolerated to left leg Hemovac was discontinued on today's visit We'll start patient on Pyridium Patient needs to have a bowel movement today   Jon R. [place] 09/03/2014, 7:00 AM

## 2014-09-03 NOTE — Progress Notes (Signed)
Physical Therapy Treatment Patient Details Name: Michael Colon MRN: [id number] DOB: 08-16-1930 Today's Date: 09/03/2014    History of Present Illness This patient is an 81 year old male who came to [facility] for a L TKR.    PT Comments    Pt limited with mobility this morning secondary to pain. Nurse was notified of this for afternoon session. Although he is somewhat limited, he still expresses wishes to participate and challenge himself. Once pain is controlled, will attempt longer ambulation. Due to pain, ROM, strength, and gait deficits, pt will continue to benefit from skilled PT in order to return home safely.   Follow Up Recommendations  Home health PT;Supervision for mobility/OOB     Equipment Recommendations  Rolling walker with 5" wheels    Recommendations for Other Services       Precautions / Restrictions Precautions Precautions: Fall;Knee Precaution Booklet Issued: Yes (comment) Restrictions Weight Bearing Restrictions: Yes LLE Weight Bearing: Weight bearing as tolerated    Mobility  Bed Mobility Overal bed mobility: Needs Assistance Bed Mobility: Sit to Supine     Supine to sit: Min assist Sit to supine: Min assist   General bed mobility comments: Pt requiring assist today secondary to pain limiting his AROM. Assist is needed for LE management where it previously had not been required. Pt reports pain with mobility. Nurse notified.   Transfers Overall transfer level: Needs assistance Equipment used: Rolling walker (2 wheeled) Transfers: Sit to/from Stand Sit to Stand: Min assist         General transfer comment: Pt demonstrating good use of hands with transfers, as well as propping his leg out to avoid painful excessive bending, however he does require assist for getting up into standing. No unsteadiness or LOB. No buckling.   Ambulation/Gait Ambulation/Gait assistance: Min guard Ambulation Distance (Feet): 40 Feet Assistive device: Left platform  walker Gait Pattern/deviations: Decreased step length - right;Step-to pattern;Decreased step length - left;Decreased stride length;Antalgic Gait velocity: decreased   General Gait Details: Pt ambulation performance in decline this morning secondary to pain. Pt was received shortly after walking to the bathroom and felt that he could not walk any more. Pt was able to walk an additional 40 ft with therapy and appears to be able to tolerate discomfort. Most notably is that he is walking with step-to pattern where he previously was ambulating reciprocally. Pt understands need to walk greater distance this afternoon.  (No L knee buckling during any mobility)   Stairs            Wheelchair Mobility    Modified Rankin (Stroke Patients Only)       Balance Overall balance assessment: No apparent balance deficits (not formally assessed)                                  Cognition Arousal/Alertness: Awake/alert Behavior During Therapy: WFL for tasks assessed/performed Overall Cognitive Status: Within Functional Limits for tasks assessed                      Exercises Total Joint Exercises Goniometric ROM: L knee AAROM: 1 - 92 degrees Other Exercises Other Exercises: Pt performed therex x12 reps in supine and sitting on bilateral LEs with supervision for proper technique, and occasional min assist. Exercises performed: ankle pumps, glute sets, quad sets, heel slides, SLR, hip abd/add (min assist). (Pt needing min assist for ABD and SLR today)  General Comments        Pertinent Vitals/Pain Pain Assessment: 0-10 Pain Score: 8  Pain Location: L knee Pain Intervention(s): Limited activity within patient's tolerance;Monitored during session;Premedicated before session;Ice applied;Utilized relaxation techniques    Home Living                      Prior Function            PT Goals (current goals can now be found in the care plan section) Acute Rehab  PT Goals Patient Stated Goal: to do what i can PT Goal Formulation: With patient Time For Goal Achievement: 09/15/14 Potential to Achieve Goals: Good Progress towards PT goals: Progressing toward goals    Frequency  BID    PT Plan Current plan remains appropriate    Co-evaluation             End of Session Equipment Utilized During Treatment: Gait belt Activity Tolerance: Patient tolerated treatment well Patient left: in bed;with call bell/phone within reach;with bed alarm set;with SCD's reapplied (upright in bed per pt request )     Time: 6701-4103 PT Time Calculation (min) (ACUTE ONLY): 24 min  Charges:                       G CodesJanyth Contes 09/05/14, 10:59 AM  Janyth Contes, SPT. [phone]

## 2014-09-03 NOTE — Progress Notes (Signed)
Pt. Alert and oriented. VSS. No c/o pain. Using urinal. Cough and deep breathing incouraged. Resting quietly.

## 2014-09-03 NOTE — Progress Notes (Signed)
Physical Therapy Treatment Patient Details Name: Michael Colon MRN: [id number] DOB: 03/06/1930 Today's Date: 09/03/2014    History of Present Illness This patient is an 81 year old male who came to [facility] for a L TKR.    PT Comments    Pt feeling better this afternoon in terms of pain, however his performance continues to struggle. His ambulation distance has increased, but the quality of ambulation still has limited improvement since yesterday. Pt requiring assist for bed mobility/transfers and needs reinforcement on safe practices during transfers. Pt currently unable to get out of flat bed without rails by himself. Will work on bed mobility tomorrow to progress this. Pt will continue to benefit from skilled PT to address his mobility, ROM, and strength deficits in order to safely return home. Should be noted that pt is pleasant to work with and very motivated to improve during therapy sessions.  Follow Up Recommendations  SNF     Equipment Recommendations  Rolling walker with 5" wheels    Recommendations for Other Services       Precautions / Restrictions Precautions Precautions: Fall;Knee Precaution Booklet Issued: Yes (comment) Restrictions Weight Bearing Restrictions: Yes LLE Weight Bearing: Weight bearing as tolerated    Mobility  Bed Mobility Overal bed mobility: Needs Assistance Bed Mobility: Supine to Sit     Supine to sit: Min assist Sit to supine: Min assist   General bed mobility comments: Pt still requiring assistance to rise into sitting at EOB. Also occasionally needs assistance for LE. Pt attempted bed mobility with bed flat and no use of arm rails, demonstrating that he does not have the ability to successfully perform bed mobility in those conditions. Requires mod assist in this scenario.   Transfers Overall transfer level: Needs assistance Equipment used: Rolling walker (2 wheeled) Transfers: Sit to/from Stand Sit to Stand: Min assist          General transfer comment: Pt requiring min assist to get into full standing. It was also noted that pt tends to fall/plop down into his seat/bed uncontrolled at times and needs assist for this. This afternoon was more of an uncontrolled descent than a plop (needed to be controlled down by therapist)  Ambulation/Gait Ambulation/Gait assistance: Min assist Ambulation Distance (Feet): 80 Feet Assistive device: Left platform walker Gait Pattern/deviations: Step-to pattern;Decreased step length - right;Decreased step length - left;Decreased stance time - left Gait velocity: decreased Gait velocity interpretation: <1.8 ft/sec, indicative of risk for recurrent falls (0.4 ft/s) General Gait Details: Pt able to ambulate further this afternoon, but he still requires cueing for sequencing of gait, step length for reciprocal gait and assist for direction of RW   Stairs            Wheelchair Mobility    Modified Rankin (Stroke Patients Only)       Balance                                    Cognition Arousal/Alertness: Awake/alert Behavior During Therapy: WFL for tasks assessed/performed Overall Cognitive Status: Within Functional Limits for tasks assessed                      Exercises Other Exercises Other Exercises: Pt performed therex x12 reps in supine and sitting on bilateral LEs with supervision for proper technique, and occasional min assist. Exercises performed: ankle pumps, glute sets, quad sets, heel slides, SLR,  hip abd/add (min assist).    General Comments        Pertinent Vitals/Pain Pain Assessment: 0-10 Pain Score: 5  Pain Location: L knee Pain Intervention(s): Limited activity within patient's tolerance;Monitored during session;Premedicated before session;Ice applied    Home Living                      Prior Function            PT Goals (current goals can now be found in the care plan section) Acute Rehab PT Goals Patient  Stated Goal: to continue to walk PT Goal Formulation: With patient Time For Goal Achievement: 09/15/14 Potential to Achieve Goals: Good Progress towards PT goals: Progressing toward goals    Frequency  BID    PT Plan Discharge plan needs to be updated    Co-evaluation             End of Session Equipment Utilized During Treatment: Gait belt Activity Tolerance: Patient tolerated treatment well Patient left: in bed;with call bell/phone within reach;with bed alarm set;with SCD's reapplied     Time: 1344-1410 PT Time Calculation (min) (ACUTE ONLY): 26 min  Charges:                       G CodesJanyth Contes Sep 17, 2014, 3:40 PM  Janyth Contes, SPT. [phone]

## 2014-09-04 DIAGNOSIS — I4892 Unspecified atrial flutter: Secondary | ICD-10-CM | POA: Diagnosis not present

## 2014-09-04 DIAGNOSIS — Z96652 Presence of left artificial knee joint: Secondary | ICD-10-CM | POA: Diagnosis not present

## 2014-09-04 DIAGNOSIS — N400 Enlarged prostate without lower urinary tract symptoms: Secondary | ICD-10-CM | POA: Diagnosis not present

## 2014-09-04 DIAGNOSIS — M1990 Unspecified osteoarthritis, unspecified site: Secondary | ICD-10-CM | POA: Diagnosis not present

## 2014-09-04 DIAGNOSIS — G4733 Obstructive sleep apnea (adult) (pediatric): Secondary | ICD-10-CM | POA: Diagnosis not present

## 2014-09-04 DIAGNOSIS — E119 Type 2 diabetes mellitus without complications: Secondary | ICD-10-CM | POA: Diagnosis not present

## 2014-09-04 DIAGNOSIS — M1712 Unilateral primary osteoarthritis, left knee: Secondary | ICD-10-CM | POA: Diagnosis not present

## 2014-09-04 DIAGNOSIS — M6281 Muscle weakness (generalized): Secondary | ICD-10-CM | POA: Diagnosis not present

## 2014-09-04 DIAGNOSIS — Z471 Aftercare following joint replacement surgery: Secondary | ICD-10-CM | POA: Diagnosis not present

## 2014-09-04 DIAGNOSIS — R2689 Other abnormalities of gait and mobility: Secondary | ICD-10-CM | POA: Diagnosis not present

## 2014-09-04 DIAGNOSIS — N411 Chronic prostatitis: Secondary | ICD-10-CM | POA: Diagnosis not present

## 2014-09-04 DIAGNOSIS — L03116 Cellulitis of left lower limb: Secondary | ICD-10-CM | POA: Diagnosis not present

## 2014-09-04 DIAGNOSIS — K219 Gastro-esophageal reflux disease without esophagitis: Secondary | ICD-10-CM | POA: Diagnosis not present

## 2014-09-04 DIAGNOSIS — E785 Hyperlipidemia, unspecified: Secondary | ICD-10-CM | POA: Diagnosis not present

## 2014-09-04 LAB — PROTIME-INR
INR: 1.44
Prothrombin Time: 17.7 seconds — ABNORMAL HIGH (ref 11.4–15.0)

## 2014-09-04 LAB — CBC
HCT: 35.9 % — ABNORMAL LOW (ref 40.0–52.0)
Hemoglobin: 12.4 g/dL — ABNORMAL LOW (ref 13.0–18.0)
MCH: 31.7 pg (ref 26.0–34.0)
MCHC: 34.6 g/dL (ref 32.0–36.0)
MCV: 91.6 fL (ref 80.0–100.0)
Platelets: 106 10*3/uL — ABNORMAL LOW (ref 150–440)
RBC: 3.91 MIL/uL — ABNORMAL LOW (ref 4.40–5.90)
RDW: 12.7 % (ref 11.5–14.5)
WBC: 10 10*3/uL (ref 3.8–10.6)

## 2014-09-04 LAB — GLUCOSE, CAPILLARY
Glucose-Capillary: 223 mg/dL — ABNORMAL HIGH (ref 65–99)
Glucose-Capillary: 299 mg/dL — ABNORMAL HIGH (ref 65–99)

## 2014-09-04 MED ORDER — FERROUS SULFATE 325 (65 FE) MG PO TABS: 325.0000 mg | ORAL_TABLET | Freq: Two times a day (BID) | ORAL | Status: DC

## 2014-09-04 MED ORDER — HYDROCODONE-ACETAMINOPHEN 5-325 MG PO TABS: 1.0000 | ORAL_TABLET | ORAL | Status: DC | PRN

## 2014-09-04 MED ORDER — SENNOSIDES-DOCUSATE SODIUM 8.6-50 MG PO TABS: 1.0000 | ORAL_TABLET | Freq: Two times a day (BID) | ORAL | Status: DC

## 2014-09-04 NOTE — Progress Notes (Signed)
Pt had large bowel movement.

## 2014-09-04 NOTE — Progress Notes (Addendum)
  Subjective: 3 Days Post-Op Procedure(s) (LRB): TOTAL KNEE ARTHROPLASTY (Left) Patient reports pain as mild.   Patient seen in rounds with Dr. Marry Guan. Patient is well, and has had no acute complaints or problems Plan is to go Rehab after hospital stay. Negative for chest pain and shortness of breath Fever: no Gastrointestinal: Negative for nausea and vomiting. The patient had a bowel movement this morning.  Objective: Vital signs in last 24 hours: Temp:  [97.7 F (36.5 C)-98.2 F (36.8 C)] 98.2 F (36.8 C) (09/01 0348) Pulse Rate:  [67-90] 90 (09/01 0348) Resp:  [16-18] 18 (09/01 0348) BP: (116-153)/(76-84) 140/76 mmHg (09/01 0348) SpO2:  [95 %-98 %] 95 % (09/01 0348)  Intake/Output from previous day:  Intake/Output Summary (Last 24 hours) at 09/04/14 0604 Last data filed at 09/04/14 0349  Gross per 24 hour  Intake    720 ml  Output   2300 ml  Net  -1580 ml    Intake/Output this shift: Total I/O In: -  Out: 750 [Urine:750]  Labs:  Recent Labs  09/02/14 0648 09/03/14 0608  HGB 13.2 12.8*    Recent Labs  09/02/14 0648 09/03/14 0608  WBC 12.2* 10.7*  RBC 4.20* 4.08*  HCT 38.8* 37.4*  PLT 106* 99*    Recent Labs  09/02/14 0648  NA 135  K 3.7  CL 104  CO2 25  BUN 19  CREATININE 0.73  GLUCOSE 216*  CALCIUM 9.3    Recent Labs  09/02/14 0648 09/03/14 0608  INR 1.26 1.35     EXAM General - Patient is Alert and Oriented Extremity - Neurovascular intact Dorsiflexion/Plantar flexion intact No cellulitis present Compartment soft Dressing/Incision - clean, dry, blood tinged drainage Motor Function - intact, moving foot and toes well on exam. Patient is able to do a straight leg raise with very minimal assistance. He ambulated 80 feet with physical therapy.  Past Medical History  Diagnosis Date  . Chronic prostatitis   . Obstructive sleep apnea     CPAP-9  . Chronic ear infection   . Asthma   . GERD (gastroesophageal reflux disease)    laryngeal involvement  . Hypertension   . Osteoarthrosis, localized, primary, knee     post-traumatic  . Diabetes mellitus   . Tachy-brady syndrome   . Atrial flutter 2007  . Benign prostatic hypertrophy   . Hyperlipidemia   . Band keratopathy   . UTI (urinary tract infection)   . Prostatitis   . Chronic kidney disease   . Stroke     TIA    Assessment/Plan: 3 Days Post-Op Procedure(s) (LRB): TOTAL KNEE ARTHROPLASTY (Left) Active Problems:   Total knee replacement status  Estimated body mass index is 32.65 kg/(m^2) as calculated from the following:   Height as of this encounter: 5\' 11"  (1.803 m).   Weight as of this encounter: 106.142 kg (234 lb). Discharge to SNF  DVT Prophylaxis - Coumadin, Foot Pumps and TED hose Weight-Bearing as tolerated to left leg  Reche Dixon, PA-C Orthopaedic Surgery 09/04/2014, 6:04 AM

## 2014-09-04 NOTE — Progress Notes (Signed)
ANTICOAGULATION CONSULT NOTE - Follow Up Consult  Pharmacy Consult for Warfarin Indication: VTE prophylaxis/HX of A. Flutter  Allergies  Allergen Reactions  . Enoxaparin Sodium Hives  . Latex Rash  . Nickel Rash  . Percocet [Oxycodone-Acetaminophen] Rash    Patient Measurements: Height: 5\' 11"  (180.3 cm) Weight: 234 lb (106.142 kg) IBW/kg (Calculated) : 75.3   Vital Signs: Temp: 98.7 F (37.1 C) (09/01 1324) Temp Source: Oral (09/01 1324) BP: 126/71 mmHg (09/01 1324) Pulse Rate: 72 (09/01 1324)  Labs:  Recent Labs  09/02/14 0648 09/03/14 0608 09/04/14 0553  HGB 13.2 12.8* 12.4*  HCT 38.8* 37.4* 35.9*  PLT 106* 99* 106*  LABPROT 16.0* 16.9* 17.7*  INR 1.26 1.35 1.44  CREATININE 0.73  --   --     Estimated Creatinine Clearance: 89.7 mL/min (by C-G formula based on Cr of 0.73).   Medications:  Prescriptions prior to admission  Medication Sig Dispense Refill Last Dose  . BAYER CONTOUR TEST test strip USE STRIP AS INSTRUCTED TO TEST BLOOD SUGAR TWICE DAILY 100 each 11 08/31/2014 at Unknown time  . docusate sodium (COLACE) 100 MG capsule Take 100 mg by mouth 2 (two) times daily.    08/31/2014 at Unknown time  . famotidine (PEPCID) 20 MG tablet Take 20 mg by mouth daily.    08/31/2014 at Unknown time  . finasteride (PROSCAR) 5 MG tablet TAKE ONE TABLET BY MOUTH ONCE DAILY 90 tablet 2 08/31/2014 at Unknown time  . fish oil-omega-3 fatty acids 1000 MG capsule Take 1 g by mouth daily.    Past Week at Unknown time  . Glucosamine Sulfate-MSM (GLUCOSAMINE-MSM DS) 500-500 MG TABS Take 1,500 mg by mouth 2 (two) times daily.    Past Week at Unknown time  . glucose blood test strip Use as instructed to test blood sugar once daily dx: 250.00   08/31/2014 at Unknown time  . ibuprofen (ADVIL,MOTRIN) 200 MG tablet Take 400 mg by mouth 2 (two) times daily.   08/20/2014 at Unknown time  . loratadine (CLARITIN) 10 MG tablet Take 10 mg by mouth daily.     08/31/2014 at Unknown time  .  MICROLET LANCETS MISC Use to test twice daily or as directed 100 each 3 08/31/2014 at Unknown time  . Multiple Vitamins-Minerals (CENTRUM SILVER PO) Take 1 tablet by mouth daily after breakfast.    08/31/2014 at Unknown time  . tamsulosin (FLOMAX) 0.4 MG CAPS capsule Take 1 capsule (0.4 mg total) by mouth daily. 90 capsule 3 08/31/2014 at Unknown time  . triamterene-hydrochlorothiazide (DYAZIDE) 37.5-25 MG per capsule TAKE ONE CAPSULE BY MOUTH IN THE MORNING 90 capsule 3 08/31/2014 at Unknown time  . warfarin (COUMADIN) 5 MG tablet Take 5-7.5 mg by mouth See admin instructions. Take 7.5mg  on Monday, Tuesday, Wednesday, Thursday, Friday and Saturday. Take 5mg  on Sunday   08/27/2014  . nitroGLYCERIN (NITROSTAT) 0.4 MG SL tablet Place 0.4 mg under the tongue every 5 (five) minutes as needed for chest pain.   Not Taking at Unknown time    Assessment:  Patient is an 81yo male status post knee replacement, Day 3 of warfarin therapy. He does have a history of A.Flutter and was taking warfarin outpatient. Patients out patient regimen was 7.5mg  daily, except Sunday he was taking 5mg . His INR was with in goal (2-3) prior to holding warfarin for surgery.   Patient's INR up to 1.44 today. Per Orthopedics, patient to be discharged today based on note.    Goal of Therapy:  INR 2-3 Monitor CBC per anticoagulation policy.    Plan:  Will continue patient's home dose on 7.5, based on INR improvement and history of being stable on the 7.5mg  dose. INR ordered for AM, will follow up tomorrow.  Larene Beach, PharmD

## 2014-09-04 NOTE — Progress Notes (Signed)
Inpatient Diabetes Program Recommendations  AACE/ADA: New Consensus Statement on Inpatient Glycemic Control (2013)  Target Ranges:  Prepandial:   less than 140 mg/dL      Peak postprandial:   less than 180 mg/dL (1-2 hours)      Critically ill patients:  140 - 180 mg/dL  Results for Michael Colon, Michael Colon (MRN [id number]) as of 09/04/2014 09:25  Ref. Range 09/02/2014 07:57 09/02/2014 10:56 09/02/2014 16:28 09/03/2014 08:08 09/03/2014 11:24 09/03/2014 15:42 09/03/2014 20:57  Glucose-Capillary Latest Ref Range: 65-99 mg/dL 254 (H) 205 (H) 198 (H) 224 (H) 278 (H) 257 (H) 184 (H)   Results for Michael Colon, Michael Colon (MRN [id number]) as of 09/04/2014 09:25  Ref. Range 08/20/2014 10:15  Hemoglobin A1C Latest Ref Range: 4.0-6.0 % 7.8 (H)    Diabetes history: DM2 Outpatient Diabetes medications: None Current orders for Inpatient glycemic control: Novolog 0-15 units TID with meals  Inpatient Diabetes Program Recommendations HgbA1C: A1C 7.8% on 08/20/2014. Patient will need to follow up with his PCP regarding glycemic control as he will likely need to start on DM medication.  Note: Over the past 24 hours glucose has ranged from 184-278 mg/dl and patient has received a total of Novolog 21 units for correction. Patient will most likely need to be started on DM medication for glycemic control.  Thanks, Barnie Alderman, RN, MSN, CCRN, CDE Diabetes Coordinator Inpatient Diabetes Program [phone] (Team Pager from [place] to [place]) [phone] ([facility] office) [phone] [facility] office) [phone] [facility] office)

## 2014-09-04 NOTE — Clinical Social Work Placement (Signed)
   CLINICAL SOCIAL WORK PLACEMENT  NOTE  Date:  09/04/2014  Patient Details  Name: Michael Colon MRN: [id number] Date of Birth: 03-Oct-1930  Clinical Social Work is seeking post-discharge placement for this patient at the [place] level of care (*CSW will initial, date and re-position this form in  chart as items are completed):  Yes   Patient/family provided with [facility] Work Department's list of facilities offering this level of care within the geographic area requested by the patient (or if unable, by the patient's family).  Yes   Patient/family informed of their freedom to choose among providers that offer the needed level of care, that participate in Medicare, Medicaid or managed care program needed by the patient, have an available bed and are willing to accept the patient.  Yes   Patient/family informed of [facility]'s ownership interest in [facility] and [facility], as well as of the fact that they are under no obligation to receive care at these facilities.  PASRR submitted to EDS on 09/03/14     PASRR number received on 09/03/14     Existing PASRR number confirmed on       FL2 transmitted to all facilities in geographic area requested by pt/family on 09/03/14     FL2 transmitted to all facilities within larger geographic area on       Patient informed that his/her managed care company has contracts with or will negotiate with certain facilities, including the following:   (Patient is a resident of [place] and thus has a bed reserved at [facility])     Yes   Patient/family informed of bed offers received.  Patient chooses bed at  [facility])     Physician recommends and patient chooses bed at  [facility])    Patient to be transferred to   on 09/04/14.  Patient to be transferred to facility by  [facility] will provide transport. )     Patient family notified on 09/04/14 of  transfer.  Name of family member notified:   (Son Shanon Brow is at bedside and aware of D/C. )     PHYSICIAN       Additional Comment:    _______________________________________________ Loralyn Freshwater, LCSW 09/04/2014, 9:43 AM

## 2014-09-04 NOTE — Progress Notes (Signed)
Patient is medically stable for D/C to [facility] today. Per Seth Bake admissions coordinator at [facility] patient is going to room 310. RN will call report at [phone]. Per [facility] will provide transport and pick patient up in the room today between 2 and 2:15 pm. Clinical Social Worker (CSW) prepared D/C packet and sent D/C Summary and follow up appointments to [facility] via carefinder. Patient is aware of above. Patient's son Shanon Brow is at bedside and aware of above. Please reconsult if future social work needs arise. CSW signing off.   Blima Rich, [place] [phone]

## 2014-09-04 NOTE — Progress Notes (Signed)
Called report to [facility], gave report to [employer], LPN.  [facility] will come and pick patient up around 1400-1415.

## 2014-09-04 NOTE — Discharge Instructions (Signed)

## 2014-09-04 NOTE — Progress Notes (Signed)
Physical Therapy Treatment Patient Details Name: Michael Colon MRN: [id number] DOB: 1930/10/02 Today's Date: 09/04/2014    History of Present Illness This patient is an 81 year old male who came to [facility] for a L TKR.    PT Comments    Pt continues to be limited by pain and weakness, although he has no buckling in LLE with any mobility. Pt able to perform bed exercises and demonstrates understanding of exercise program. Pt requires assist for bed mobility and transfers. Pt is very motivated to participate with therapy and give full effort, regardless of current pain and mobility deficits. He will continue to benefit from skilled PT in order to address these deficits and eventually return home safely.   Follow Up Recommendations  SNF     Equipment Recommendations  Rolling walker with 5" wheels    Recommendations for Other Services       Precautions / Restrictions Precautions Precautions: Fall;Knee Precaution Booklet Issued: Yes (comment) (Exercise packet issued) Restrictions Weight Bearing Restrictions: Yes LLE Weight Bearing: Weight bearing as tolerated    Mobility  Bed Mobility Overal bed mobility: Needs Assistance Bed Mobility: Supine to Sit     Supine to sit: Min assist Sit to supine: Min assist   General bed mobility comments: Pt requires assist for LE managment, but can control his trunk relatively well with use of hands. He particularly struggles with abduction/adduction with bed mobility    Transfers Overall transfer level: Needs assistance Equipment used: Rolling walker (2 wheeled) Transfers: Sit to/from Stand Sit to Stand: Mod assist         General transfer comment: Pt requiring assist to get into standing a little moreso today than yesterday. Pt states feeling pain in knee that is limiting his ability to stand. He is able to get into standing with the assist and increased time. No buckling noted.  Ambulation/Gait Ambulation/Gait assistance: Min  assist Ambulation Distance (Feet): 40 Feet Assistive device: Left platform walker Gait Pattern/deviations: Step-to pattern;Decreased step length - right;Decreased step length - left;Antalgic Gait velocity: decreased   General Gait Details: Pt gait still struggling this morning secondary to pain and weakness. Pt consistently demonstrating non-reciprocal pattern and heavy leaning into RW. Pt is stable in gait with no buckling but continues to need cues for sequencing.   Stairs            Wheelchair Mobility    Modified Rankin (Stroke Patients Only)       Balance Overall balance assessment: No apparent balance deficits (not formally assessed)                                  Cognition Arousal/Alertness: Awake/alert Behavior During Therapy: WFL for tasks assessed/performed Overall Cognitive Status: Within Functional Limits for tasks assessed                      Exercises Total Joint Exercises Goniometric ROM: L knee AAROM 1 - 85 Other Exercises Other Exercises: Pt performed therex x15 reps in supine and sitting on bilateral LEs with supervision for proper technique, and occasional min assist. Exercises performed: ankle pumps, glute sets, quad sets, heel slides, SLR, hip abd/add (min assist).    General Comments        Pertinent Vitals/Pain Pain Assessment: 0-10 Pain Score: 5  Pain Location: L knee Pain Intervention(s): Monitored during session;Premedicated before session;Ice applied    Home Living  Prior Function            PT Goals (current goals can now be found in the care plan section) Acute Rehab PT Goals Patient Stated Goal: to continue to walk PT Goal Formulation: With patient Time For Goal Achievement: 09/15/14 Potential to Achieve Goals: Good Progress towards PT goals: Progressing toward goals    Frequency  BID    PT Plan Current plan remains appropriate    Co-evaluation              End of Session Equipment Utilized During Treatment: Gait belt Activity Tolerance: Patient tolerated treatment well Patient left: in bed;with call bell/phone within reach;with bed alarm set;with SCD's reapplied     Time: 2035-5974 PT Time Calculation (min) (ACUTE ONLY): 27 min  Charges:                       G CodesJanyth Contes 2014-10-04, 10:04 AM  Janyth Contes, SPT. [id number]

## 2014-09-04 NOTE — Discharge Summary (Signed)
Physician Discharge Summary  Subjective: 3 Days Post-Op Procedure(s) (LRB): TOTAL KNEE ARTHROPLASTY (Left) Patient reports pain as mild.   Patient seen in rounds with Dr. Marry Guan. Patient is well, and has had no acute complaints or problems Patient is ready to go to rehabilitation with physical therapy.  Physician Discharge Summary  Patient ID: Michael Colon MRN: [id number] DOB/AGE: [birth year] 79 y.o.  Admit date: 09/01/2014 Discharge date: 09/04/2014  Admission Diagnoses:  Discharge Diagnoses:  Active Problems:   Total knee replacement status   Discharged Condition: good  Hospital Course: The patient is status post left total knee replacement done on 09/01/2014. The patient has done well since surgery and has been ambulating with physical therapy up to 80 feet. Patient is slowly improving on doing a straight leg raise but still has some difficulty and needs mild assistance. The patient had normal labs although he was kept on ferrous sulfate for iron supplementation. The patient was ready to go to rehabilitation on postop day 3.  Treatments: surgery:  PROCEDURE: Left total knee arthroplasty using computer-assisted navigation  SURGEON: Marciano Sequin. M.D.  ASSISTANT: Vance Peper, PA (present and scrubbed throughout the case, critical for assistance with exposure, retraction, instrumentation, and closure)  ANESTHESIA: spinal  ESTIMATED BLOOD LOSS: 550 mL  FLUIDS REPLACED: 1600 mL of crystalloid  TOURNIQUET TIME: 136 minutes  DRAINS: 2 medium drains to a reinfusion system  SOFT TISSUE RELEASES: Anterior cruciate ligament, posterior cruciate ligament, deep medial collateral ligament, patellofemoral ligament   IMPLANTS UTILIZED: DePuy PFC Sigma size 5 posterior stabilized femoral component (cemented), size 5 MBT tibial component (cemented), 41 mm 3 peg oval dome patella (cemented), and a 10 mm stabilized rotating platform polyethylene insert.   Discharge Exam: Blood  pressure 140/76, pulse 90, temperature 98.2 F (36.8 C), temperature source Oral, resp. rate 18, height 5\' 11"  (1.803 m), weight 106.142 kg (234 lb), SpO2 95 %.   Disposition: Final discharge disposition not confirmed     Medication List    TAKE these medications        CENTRUM SILVER PO  Take 1 tablet by mouth daily after breakfast.     docusate sodium 100 MG capsule  Commonly known as:  COLACE  Take 100 mg by mouth 2 (two) times daily.     famotidine 20 MG tablet  Commonly known as:  PEPCID  Take 20 mg by mouth daily.     ferrous sulfate 325 (65 FE) MG tablet  Take 1 tablet (325 mg total) by mouth 2 (two) times daily with a meal.     finasteride 5 MG tablet  Commonly known as:  PROSCAR  TAKE ONE TABLET BY MOUTH ONCE DAILY     fish oil-omega-3 fatty acids 1000 MG capsule  Take 1 g by mouth daily.     GLUCOSAMINE-MSM DS 500-500 MG Tabs  Generic drug:  Glucosamine Sulfate-MSM  Take 1,500 mg by mouth 2 (two) times daily.     glucose blood test strip  Use as instructed to test blood sugar once daily dx: 250.00     BAYER CONTOUR TEST test strip  Generic drug:  glucose blood  USE STRIP AS INSTRUCTED TO TEST BLOOD SUGAR TWICE DAILY     HYDROcodone-acetaminophen 5-325 MG per tablet  Commonly known as:  NORCO/VICODIN  Take 1-2 tablets by mouth every 4 (four) hours as needed for moderate pain.     ibuprofen 200 MG tablet  Commonly known as:  ADVIL,MOTRIN  Take 400 mg by mouth  2 (two) times daily.     loratadine 10 MG tablet  Commonly known as:  CLARITIN  Take 10 mg by mouth daily.     MICROLET LANCETS Misc  Use to test twice daily or as directed     nitroGLYCERIN 0.4 MG SL tablet  Commonly known as:  NITROSTAT  Place 0.4 mg under the tongue every 5 (five) minutes as needed for chest pain.     senna-docusate 8.6-50 MG per tablet  Commonly known as:  Senokot-S  Take 1 tablet by mouth 2 (two) times daily.     tamsulosin 0.4 MG Caps capsule  Commonly known as:   FLOMAX  Take 1 capsule (0.4 mg total) by mouth daily.     triamterene-hydrochlorothiazide 37.5-25 MG per capsule  Commonly known as:  DYAZIDE  TAKE ONE CAPSULE BY MOUTH IN THE MORNING     warfarin 5 MG tablet  Commonly known as:  COUMADIN  Take 5-7.5 mg by mouth See admin instructions. Take 7.5mg  on Monday, Tuesday, Wednesday, Thursday, Friday and Saturday. Take 5mg  on Sunday           Follow-up Information    Follow up with [facility] On 09/16/2014.   Specialty:  Physician Assistant   Why:  at 9:15am   Contact information:   [place] [address] [phone]       Follow up with Dereck Leep, MD On 10/14/2014.   Specialty:  Orthopedic Surgery   Why:  at 2:15pm   Contact information:   [place] [address] [phone]       Follow up with Watt Climes., PA On 09/16/2014.   Specialty:  Physician Assistant   Why:  For staple removal   Contact information:   [place] [address] [phone]       Signed: Prescott Parma, Avanna Sowder 09/04/2014, 6:11 AM   Objective: Vital signs in last 24 hours: Temp:  [97.7 F (36.5 C)-98.2 F (36.8 C)] 98.2 F (36.8 C) (09/01 0348) Pulse Rate:  [67-90] 90 (09/01 0348) Resp:  [16-18] 18 (09/01 0348) BP: (116-153)/(76-84) 140/76 mmHg (09/01 0348) SpO2:  [95 %-98 %] 95 % (09/01 0348)  Intake/Output from previous day:  Intake/Output Summary (Last 24 hours) at 09/04/14 0611 Last data filed at 09/04/14 0349  Gross per 24 hour  Intake    720 ml  Output   2300 ml  Net  -1580 ml    Intake/Output this shift: Total I/O In: -  Out: 750 [Urine:750]  Labs:  Recent Labs  09/02/14 0648 09/03/14 0608 09/04/14 0553  HGB 13.2 12.8* 12.4*    Recent Labs  09/03/14 0608 09/04/14 0553  WBC 10.7* 10.0  RBC 4.08* 3.91*  HCT 37.4* 35.9*  PLT 99* 106*    Recent Labs  09/02/14 0648  NA 135  K 3.7  CL 104  CO2 25  BUN 19  CREATININE 0.73  GLUCOSE 216*   CALCIUM 9.3    Recent Labs  09/02/14 0648 09/03/14 0608  INR 1.26 1.35    EXAM: General - Patient is Alert and Oriented Extremity - Neurovascular intact Dorsiflexion/Plantar flexion intact No cellulitis present Compartment soft Incision - clean, dry, blood tinged drainage Motor Function -  the patient is able to ambulate 80 feet. Patient needs very mild assistants with straight leg raise. His dorsiflexion and plantarflexion is intact.  Assessment/Plan: 3 Days Post-Op Procedure(s) (LRB): TOTAL KNEE ARTHROPLASTY (Left) Procedure(s) (LRB): TOTAL KNEE ARTHROPLASTY (Left) Past  Medical History  Diagnosis Date  . Chronic prostatitis   . Obstructive sleep apnea     CPAP-9  . Chronic ear infection   . Asthma   . GERD (gastroesophageal reflux disease)     laryngeal involvement  . Hypertension   . Osteoarthrosis, localized, primary, knee     post-traumatic  . Diabetes mellitus   . Tachy-brady syndrome   . Atrial flutter 2007  . Benign prostatic hypertrophy   . Hyperlipidemia   . Band keratopathy   . UTI (urinary tract infection)   . Prostatitis   . Chronic kidney disease   . Stroke     TIA   Active Problems:   Total knee replacement status  Estimated body mass index is 32.65 kg/(m^2) as calculated from the following:   Height as of this encounter: 5\' 11"  (1.803 m).   Weight as of this encounter: 106.142 kg (234 lb). Discharge to SNF Diet - Regular diet Follow up - in 2 weeks Activity - WBAT Disposition - Rehab Condition Upon Discharge - Good DVT Prophylaxis - Coumadin and TED hose  Reche Dixon, PA-C Orthopaedic Surgery 09/04/2014, 6:11 AM

## 2014-09-05 ENCOUNTER — Telehealth: Payer: Self-pay | Admitting: *Deleted

## 2014-09-05 NOTE — Telephone Encounter (Signed)
Dr. Silvio Pate out of the office sent to Webb Silversmith, NP  [place] Patient Name: Michael Colon Gender: Male DOB: 07/02/1930 Age: 81 Y 9 M 22 D Return Phone Number: Address: City/State/Zip: [place] Client [place] Night - Client Client Site [place] Physician [name] Contact Type Call Call Type Page Only Caller Name Caryl Pina Is this call to report lab results? No Return Phone Number Please choose phone number Initial [facility]([phone]) is calling to get orders regarding pt's blood sugar; Nurse Assessment Guidelines Guideline Title Affirmed Question Affirmed Notes Nurse Date/Time (Eastern Time) Disp. Time Eilene Ghazi Time) Disposition Final User 09/04/2014 5:48:57 PM Send to [place], Joseph 09/04/2014 5:56:33 PM Called On-Call Provider Silvio Clayman 09/04/2014 5:57:11 PM Page Completed Yes Silvio Clayman After Care Instructions Given Call Event Type User Date / Time Description Paging [facility] Phone DateTime Result/Outcome Message Type Notes Vertell Novak [phone] 09/04/2014 5:56:33 PM Called On Call Provider - Reached Doctor Paged Vertell Novak 09/04/2014 5:57:02 PM Paged On Call to Another Provider Message Result Page information provided to on call. MD was connected with the facility.

## 2014-09-05 NOTE — Telephone Encounter (Signed)
This was adressed

## 2014-09-09 ENCOUNTER — Telehealth: Payer: Self-pay

## 2014-09-09 DIAGNOSIS — M1712 Unilateral primary osteoarthritis, left knee: Secondary | ICD-10-CM | POA: Diagnosis not present

## 2014-09-09 DIAGNOSIS — I4892 Unspecified atrial flutter: Secondary | ICD-10-CM | POA: Diagnosis not present

## 2014-09-09 DIAGNOSIS — E119 Type 2 diabetes mellitus without complications: Secondary | ICD-10-CM | POA: Diagnosis not present

## 2014-09-09 DIAGNOSIS — N400 Enlarged prostate without lower urinary tract symptoms: Secondary | ICD-10-CM | POA: Diagnosis not present

## 2014-09-09 NOTE — Telephone Encounter (Signed)
Dr. Silvio Pate has appt with him today at 1:30, will forward message to him

## 2014-09-09 NOTE — Telephone Encounter (Signed)
PLEASE NOTE: All timestamps contained within this report are represented as [place] Standard Time. CONFIDENTIALTY NOTICE: This fax transmission is intended only for the addressee. It contains information that is legally privileged, confidential or otherwise protected from use or disclosure. If you are not the intended recipient, you are strictly prohibited from reviewing, disclosing, copying using or disseminating any of this information or taking any action in reliance on or regarding this information. If you have received this fax in error, please notify us immediately by telephone so that we can arrange for its return to [place]. Phone: [phone], Toll-Free: [phone], Fax: [phone] Page: 1 of 1 Call Id: [id number] [place] Patient Name: Michael Colon Gender: Male DOB: November 21, 1930 Age: 81 Y 9 M 24 D Return Phone Number: Address: City/State/Zip: [place] Client [place] Night - Client Client Site [place] Physician [name] Contact Type Call Call Type Page Only Caller Name [place] [facility] Relationship To Patient Care Giver Is this call to report lab results? No Return Phone Number Please choose phone number Initial Comment Caller is Gates @ [phone] - Pt has 331 blood sugar - might need order for oral agent. The pt had high blood sugar a day ago also. Nurse Assessment Guidelines Guideline Title Affirmed Question Affirmed Notes Nurse Date/Time (Eastern Time) Disp. Time Eilene Ghazi Time) Disposition Final User 09/06/2014 5:42:13 PM Send to [place], Berdi 09/06/2014 5:48:24 PM Paged On Call to Other Provider Stephens November 09/06/2014 5:48:44 PM Page Completed Yes Stephens November After Care Instructions Given Call Event Type User Date / Time Description Paging DoctorName Phone DateTime  Result/Outcome Message Type Notes Penni Homans [phone] 09/06/2014 5:48:24 PM Paged On Call to Other Provider Doctor Paged Please contact Suzie at [phone] Penni Homans 09/06/2014 5:48:32 PM Paged On Call to Another Provider Message Result

## 2014-09-09 NOTE — Telephone Encounter (Signed)
I will review this later this afternoon ---appt at 4:30 actually

## 2014-09-09 NOTE — Telephone Encounter (Signed)
See 09/05/14 phone note.

## 2014-09-11 ENCOUNTER — Encounter: Payer: Self-pay | Admitting: *Deleted

## 2014-09-11 DIAGNOSIS — Z09 Encounter for follow-up examination after completed treatment for conditions other than malignant neoplasm: Secondary | ICD-10-CM

## 2014-09-11 NOTE — Patient Outreach (Signed)
[facility] Care Management  09/11/2014  EDSON DERIDDER [birth year] [id number]   RNCM referred this pt to [facility] for an assessment of [facility] services.   Plan: RNCM sent order to [facility]  Arnold Depinto RN, BSN  [facility] Care Management [phone])

## 2014-09-15 ENCOUNTER — Telehealth: Payer: Self-pay

## 2014-09-15 DIAGNOSIS — L03116 Cellulitis of left lower limb: Secondary | ICD-10-CM | POA: Diagnosis not present

## 2014-09-15 NOTE — Telephone Encounter (Signed)
PLEASE NOTE: All timestamps contained within this report are represented as [place] Standard Time. CONFIDENTIALTY NOTICE: This fax transmission is intended only for the addressee. It contains information that is legally privileged, confidential or otherwise protected from use or disclosure. If you are not the intended recipient, you are strictly prohibited from reviewing, disclosing, copying using or disseminating any of this information or taking any action in reliance on or regarding this information. If you have received this fax in error, please notify us immediately by telephone so that we can arrange for its return to [place]. Phone: [phone], Toll-Free: [phone], Fax: [phone] Page: 1 of 1 Call Id: [id number] [place] Patient Name: Michael Colon Gender: Male DOB: 12/31/1930 Age: 81 Y 9 M 30 D Return Phone Number: Address: City/State/Zip: [place] Client [place] Night - Client Client Site [place] Physician [name] Contact Type Call Call Type Page Only Caller Name Caryl Pina Relationship To Patient Tanai Bouler Is this call to report lab results? No Return Phone Number Please choose phone number Initial Comment Caryl Pina from [facility] [phone] says that PT had a knee replacement last week, and has a red area on side of knee that is warm to the touch. Nurse Assessment Guidelines Guideline Title Affirmed Question Affirmed Notes Nurse Date/Time (Eastern Time) Disp. Time Eilene Ghazi Time) Disposition Final User 09/12/2014 5:14:12 PM Send to [place], Rosie 09/12/2014 5:24:20 PM Paged On Call back to Call [place], [place] 09/12/2014 5:31:10 PM Page Completed Yes Dolphus Jenny After Care Instructions Given Call Event Type User Date / Time Description Paging DoctorName Phone DateTime Result/Outcome Message  Type Notes [name] [phone] 09/12/2014 5:24:20 PM Paged On Call Back to Call Center Doctor Paged Please call Caryl Pina at [phone]. [name] 09/12/2014 5:32:01 PM Paged On Call to Another Marvel Sapp Message Result connected with facility

## 2014-09-15 NOTE — Telephone Encounter (Signed)
Started antibiotic for cellulitis

## 2014-09-15 NOTE — Telephone Encounter (Signed)
PLEASE NOTE: All timestamps contained within this report are represented as [place] Standard Time. CONFIDENTIALTY NOTICE: This fax transmission is intended only for the addressee. It contains information that is legally privileged, confidential or otherwise protected from use or disclosure. If you are not the intended recipient, you are strictly prohibited from reviewing, disclosing, copying using or disseminating any of this information or taking any action in reliance on or regarding this information. If you have received this fax in error, please notify us immediately by telephone so that we can arrange for its return to [place]. Phone: [phone], Toll-Free: [phone], Fax: [phone] Page: 1 of 1 Call Id: [id number] [place] Patient Name: Michael Colon Gender: Male DOB: 1930/10/22 Age: 81 Y 10 M Return Phone Number: Address: City/State/Zip: [place] Client [place] Night - Client Client Site [facility] Physician [name] Contact Type Call Call Type Triage / Clinical Relationship To Patient Other Return Phone Number Please choose phone number Chief Complaint Paging or Request for Consult Initial Comment Caller states she is Ashely Short w/ [facility] needs to consult w/ on call. CB# [phone] Nurse Assessment Guidelines Guideline Title Affirmed Question Affirmed Notes Nurse Date/Time Eilene Ghazi Time) Disp. Time Eilene Ghazi Time) Disposition Final User 09/12/2014 9:13:42 PM Paged On Call back to [facility], [place], April 09/12/2014 9:15:44 PM Paged On Call back to [facility], RN, April Reason: consult 09/12/2014 9:18:18 PM Paged On Call to Other Provider Bishop, RN, April Reason: consult 09/12/2014 9:18:30 PM Clinical Call Yes Bishop, RN, April After Care Instructions Given Call Event Type User Date / Time  Description Paging [facility] Phone DateTime Result/Outcome Message Type Notes [name] [phone] 09/12/2014 9:13:42 PM Paged On Call Back to Call Center Doctor Paged please call april at the call center [phone] [name] 09/12/2014 9:20:10 PM Paged On Call to Another Provider Message Result

## 2014-09-18 ENCOUNTER — Telehealth: Payer: Self-pay

## 2014-09-18 DIAGNOSIS — M6281 Muscle weakness (generalized): Secondary | ICD-10-CM | POA: Diagnosis not present

## 2014-09-18 DIAGNOSIS — Z96652 Presence of left artificial knee joint: Secondary | ICD-10-CM | POA: Diagnosis not present

## 2014-09-18 DIAGNOSIS — R278 Other lack of coordination: Secondary | ICD-10-CM | POA: Diagnosis not present

## 2014-09-18 DIAGNOSIS — M25562 Pain in left knee: Secondary | ICD-10-CM | POA: Diagnosis not present

## 2014-09-18 DIAGNOSIS — R269 Unspecified abnormalities of gait and mobility: Secondary | ICD-10-CM | POA: Diagnosis not present

## 2014-09-18 MED ORDER — CEPHALEXIN 500 MG PO CAPS: 500.0000 mg | ORAL_CAPSULE | Freq: Three times a day (TID) | ORAL | Status: DC

## 2014-09-18 NOTE — Telephone Encounter (Signed)
Spoke with patient's wife and advised results  

## 2014-09-18 NOTE — Telephone Encounter (Signed)
Mrs Keay left v/m; pt was at [facility] and started on Keflex 500 mg for cellulitis; pt has 6 tabs left to take but Mrs Weisgerber thinks needs refill of med;pt continues with redness and feels hot at incision from knee replacement;and pt also has redness at calf of leg as well. Pt has been discharged from [facility] and Mrs Notz request Keflex refill to [facility] [address]. Mrs Brookens spoke with Marylee Floras at ortho and was advised to contact Dr Silvio Pate since he ordered initial abx. Mrs Hankin request cb.

## 2014-09-18 NOTE — Telephone Encounter (Signed)
Please let them know that I sent Rx for another week

## 2014-09-19 ENCOUNTER — Other Ambulatory Visit: Payer: Self-pay | Admitting: *Deleted

## 2014-09-19 DIAGNOSIS — Z09 Encounter for follow-up examination after completed treatment for conditions other than malignant neoplasm: Secondary | ICD-10-CM

## 2014-09-19 DIAGNOSIS — Z96652 Presence of left artificial knee joint: Secondary | ICD-10-CM | POA: Diagnosis not present

## 2014-09-19 DIAGNOSIS — M25562 Pain in left knee: Secondary | ICD-10-CM | POA: Diagnosis not present

## 2014-09-19 DIAGNOSIS — M6281 Muscle weakness (generalized): Secondary | ICD-10-CM | POA: Diagnosis not present

## 2014-09-19 DIAGNOSIS — R278 Other lack of coordination: Secondary | ICD-10-CM | POA: Diagnosis not present

## 2014-09-19 DIAGNOSIS — R269 Unspecified abnormalities of gait and mobility: Secondary | ICD-10-CM | POA: Diagnosis not present

## 2014-09-22 DIAGNOSIS — R278 Other lack of coordination: Secondary | ICD-10-CM | POA: Diagnosis not present

## 2014-09-22 DIAGNOSIS — Z96652 Presence of left artificial knee joint: Secondary | ICD-10-CM | POA: Diagnosis not present

## 2014-09-22 DIAGNOSIS — R269 Unspecified abnormalities of gait and mobility: Secondary | ICD-10-CM | POA: Diagnosis not present

## 2014-09-22 DIAGNOSIS — M6281 Muscle weakness (generalized): Secondary | ICD-10-CM | POA: Diagnosis not present

## 2014-09-22 DIAGNOSIS — M25562 Pain in left knee: Secondary | ICD-10-CM | POA: Diagnosis not present

## 2014-09-22 NOTE — Patient Outreach (Signed)
Request from [place], RN to assign SW, [place],  Assigned Thank you  [place]lla Assistant

## 2014-09-23 ENCOUNTER — Ambulatory Visit (INDEPENDENT_AMBULATORY_CARE_PROVIDER_SITE_OTHER): Payer: Medicare Other | Admitting: Cardiovascular Disease

## 2014-09-23 ENCOUNTER — Encounter: Payer: Self-pay | Admitting: Cardiovascular Disease

## 2014-09-23 ENCOUNTER — Ambulatory Visit (INDEPENDENT_AMBULATORY_CARE_PROVIDER_SITE_OTHER): Payer: Medicare Other

## 2014-09-23 ENCOUNTER — Other Ambulatory Visit: Payer: Self-pay | Admitting: *Deleted

## 2014-09-23 VITALS — BP 134/80 | HR 75 | Ht 71.0 in | Wt 232.8 lb

## 2014-09-23 DIAGNOSIS — I472 Ventricular tachycardia, unspecified: Secondary | ICD-10-CM

## 2014-09-23 DIAGNOSIS — Z7901 Long term (current) use of anticoagulants: Secondary | ICD-10-CM

## 2014-09-23 DIAGNOSIS — I4892 Unspecified atrial flutter: Secondary | ICD-10-CM

## 2014-09-23 DIAGNOSIS — E119 Type 2 diabetes mellitus without complications: Secondary | ICD-10-CM

## 2014-09-23 DIAGNOSIS — E785 Hyperlipidemia, unspecified: Secondary | ICD-10-CM

## 2014-09-23 DIAGNOSIS — I6523 Occlusion and stenosis of bilateral carotid arteries: Secondary | ICD-10-CM

## 2014-09-23 DIAGNOSIS — Z96652 Presence of left artificial knee joint: Secondary | ICD-10-CM

## 2014-09-23 DIAGNOSIS — I10 Essential (primary) hypertension: Secondary | ICD-10-CM

## 2014-09-23 DIAGNOSIS — Z5181 Encounter for therapeutic drug level monitoring: Secondary | ICD-10-CM

## 2014-09-23 LAB — POCT INR: INR: 2.3

## 2014-09-23 NOTE — Assessment & Plan Note (Signed)
We have encouraged continued exercise, careful diet management in an effort to lose weight. 

## 2014-09-23 NOTE — Patient Instructions (Signed)
You are doing well. No medication changes were made.  Please call us if you have new issues that need to be addressed before your next appt.  Your physician wants you to follow-up in: 6 months.  You will receive a reminder letter in the mail two months in advance. If you don't receive a letter, please call our office to schedule the follow-up appointment.   

## 2014-09-23 NOTE — Patient Outreach (Signed)
RNCM was successful in reaching this pt. [facility] services explained. Pt was not interested in participating in [facility] services at this time. RNCM asked pt if she could send him information in the mail about [facility] and pt was agreeable to this.   Plan: Route letter through [facility] which includes [facility] pamphlet. Make [facility] aware this pt is not interested in [facility] services at this time.  Cady Hafen RN, BSN  [facility] Care Management ([phone]) 

## 2014-09-23 NOTE — Assessment & Plan Note (Signed)
Continues to be in atrial fibrillation, tolerating anticoagulation  Ventricular rate well controlled. Is asymptomatic. We'll continue current regimen

## 2014-09-23 NOTE — Assessment & Plan Note (Signed)
Cholesterol mildly elevated. We'll discuss in follow-up on next visit No known coronary artery disease or PAD

## 2014-09-23 NOTE — Progress Notes (Signed)
Patient ID: Michael Colon, male    DOB: 06-08-1930, 81 y.o.   MRN: [id number]  HPI Comments: Michael Colon is a very pleasant 81 year old gentleman with history of atrial flutter (3/14), previous ablation , on warfarin, hypertension, borderline diabetes, Obstructive sleep apnea who presented to [facility] on August 04, 2010 with chest pain. Symptoms started after eating and it was felt he had GI spasm or hiatal hernia. Mild improvement in symptoms with nitroglycerin and GI cocktail in the emergency room. He is very active at baseline. Noted to be in atrial flutter March 2014. He reports having a TIA some time in 2014, workup negative at [facility]  Previous vein ablation surgery at [place] diabetes type 2  In followup today, he reports that he is doing well.  Recent total knee replacement on the left, came off his warfarin for the procedure, no complications. Now back on warfarin. He reports having allergy to Lovenox Minimal swelling of the left lower extremity otherwise feels well. Participating in PT, OT Reports blood pressure has been well controlled at home, denies any tachycardia. Glucose levels have been running high since the surgery. Connie on metformin, Keflex This morning glucose level CLs. Hemoglobin A1c 7.8  EKG on today's visit shows atrial flutter with ventricular rate 74 bpm, right bundle branch block  Other past medical history Previously evaluated for bradycardia. Pacemaker was not recommended at that time. Previously seen by Dr. Caryl Comes, EP Previous Holter monitor showed normal sinus rhythm with pauses up to 2.86 seconds, bradycardia with heart rates in the 20s to 30s at nighttime, 30s to 50 during the daytime.  Hemoglobin A1c in December 2014 7.5, total cholesterol up from 132 now 200   Allergies  Allergen Reactions  . Enoxaparin Sodium Hives  . Latex Rash  . Nickel Rash  . Percocet [Oxycodone-Acetaminophen] Rash    Outpatient Encounter Prescriptions as of 09/23/2014  Medication Sig   . BAYER CONTOUR TEST test strip USE STRIP AS INSTRUCTED TO TEST BLOOD SUGAR TWICE DAILY  . cephALEXin (KEFLEX) 500 MG capsule Take 1 capsule (500 mg total) by mouth 3 (three) times daily.  Marland Kitchen docusate sodium (COLACE) 100 MG capsule Take 100 mg by mouth 2 (two) times daily.   . famotidine (PEPCID) 20 MG tablet Take 20 mg by mouth daily.   . ferrous sulfate 325 (65 FE) MG tablet Take 1 tablet (325 mg total) by mouth 2 (two) times daily with a meal.  . finasteride (PROSCAR) 5 MG tablet TAKE ONE TABLET BY MOUTH ONCE DAILY  . fish oil-omega-3 fatty acids 1000 MG capsule Take 1 g by mouth daily.   . Glucosamine Sulfate-MSM (GLUCOSAMINE-MSM DS) 500-500 MG TABS Take 1,500 mg by mouth 2 (two) times daily.   Marland Kitchen glucose blood test strip Use as instructed to test blood sugar once daily dx: 250.00  . HYDROcodone-acetaminophen (NORCO/VICODIN) 5-325 MG per tablet Take 1-2 tablets by mouth every 4 (four) hours as needed for moderate pain.  Marland Kitchen ibuprofen (ADVIL,MOTRIN) 200 MG tablet Take 400 mg by mouth 2 (two) times daily.  Marland Kitchen loratadine (CLARITIN) 10 MG tablet Take 10 mg by mouth daily.    . metFORMIN (GLUCOPHAGE) 500 MG tablet Take 500 mg by mouth 2 (two) times daily with a meal.  . MICROLET LANCETS MISC Use to test twice daily or as directed  . Multiple Vitamins-Minerals (CENTRUM SILVER PO) Take 1 tablet by mouth daily after breakfast.   . nitroGLYCERIN (NITROSTAT) 0.4 MG SL tablet Place 0.4 mg under the  tongue every 5 (five) minutes as needed for chest pain.  Marland Kitchen senna-docusate (SENOKOT-S) 8.6-50 MG per tablet Take 1 tablet by mouth 2 (two) times daily.  . tamsulosin (FLOMAX) 0.4 MG CAPS capsule Take 1 capsule (0.4 mg total) by mouth daily.  Marland Kitchen triamterene-hydrochlorothiazide (DYAZIDE) 37.5-25 MG per capsule TAKE ONE CAPSULE BY MOUTH IN THE MORNING  . warfarin (COUMADIN) 5 MG tablet Take 5-7.5 mg by mouth See admin instructions. Take 7.5mg  on Monday, Tuesday, Wednesday, Thursday, Friday and Saturday. Take 5mg  on  Sunday   No facility-administered encounter medications on file as of 09/23/2014.    Past Medical History  Diagnosis Date  . Chronic prostatitis   . Obstructive sleep apnea     CPAP-9  . Chronic ear infection   . Asthma   . GERD (gastroesophageal reflux disease)     laryngeal involvement  . Hypertension   . Osteoarthrosis, localized, primary, knee     post-traumatic  . Diabetes mellitus   . Tachy-brady syndrome   . Atrial flutter 2007  . Benign prostatic hypertrophy   . Hyperlipidemia   . Band keratopathy   . UTI (urinary tract infection)   . Prostatitis   . Chronic kidney disease   . Stroke     TIA    Past Surgical History  Procedure Laterality Date  . Mastoidectomy  8/08    Dr Idelle Crouch  . Rhinoplasty  5/10    and septoplasty  . Cataract extraction, bilateral  2009  . Belpharoptosis repair      Dr [name]---didn't resolve weepy eye and eyelid drooping  . Knee surgery  1998    plate after fracture, then removed for infection  . Cardioversion  04/13/2010  . Chest pain  8/12    Stress test benign  . Appendectomy    . Left elbow surgery    . Venous ablation    . Tear duct probing  11/13    Dr Vickki Muff  . Subacromial decompression Right 2005    Arthroscopic (for rotator cuff and biceps tendon ruptures)  . Eye surgery    . Fracture surgery Left     elbow  . Total knee arthroplasty Left 09/01/2014    Procedure: TOTAL KNEE ARTHROPLASTY;  Surgeon: Dereck Leep, MD;  Location: [facility] ORS;  Service: Orthopedics;  Laterality: Left;    Social History  reports that he quit smoking about 45 years ago. His smoking use included Cigarettes. He smoked 1.00 pack per day for 0 years. He has never used smokeless tobacco. He reports that he does not drink alcohol or use illicit drugs.  Family History family history includes Colon cancer in his father; Diabetes in his father; Other in his mother.       Review of Systems  Constitutional: Negative.   Respiratory: Negative.    Cardiovascular: Positive for leg swelling.  Gastrointestinal: Negative.   Musculoskeletal: Positive for arthralgias.  Skin: Negative.   Allergic/Immunologic: Negative.   Neurological: Negative.   Hematological: Negative.   Psychiatric/Behavioral: Negative.   All other systems reviewed and are negative.   BP 134/80 mmHg  Pulse 75  Ht 5\' 11"  (1.803 m)  Wt 232 lb 12 oz (105.575 kg)  BMI 32.48 kg/m2  Physical Exam  Constitutional: He is oriented to person, place, and time. He appears well-developed and well-nourished.  HENT:  Head: Normocephalic.  Nose: Nose normal.  Mouth/Throat: Oropharynx is clear and moist.  Eyes: Conjunctivae are normal. Pupils are equal, round, and reactive to light.  Neck: Normal  range of motion. Neck supple. No JVD present.  Cardiovascular: Normal rate, regular rhythm, S1 normal, S2 normal, normal heart sounds and intact distal pulses.  Exam reveals no gallop and no friction rub.   No murmur heard. Trace edema above the sock line  Pulmonary/Chest: Effort normal and breath sounds normal. No respiratory distress. He has no wheezes. He has no rales. He exhibits no tenderness.  Abdominal: Soft. Bowel sounds are normal. He exhibits no distension. There is no tenderness.  Musculoskeletal: Normal range of motion. He exhibits edema. He exhibits no tenderness.  Lymphadenopathy:    He has no cervical adenopathy.  Neurological: He is alert and oriented to person, place, and time. Coordination normal.  Skin: Skin is warm and dry. No rash noted. No erythema.  Psychiatric: He has a normal mood and affect. His behavior is normal. Judgment and thought content normal.      Assessment and Plan   Nursing note and vitals reviewed.

## 2014-09-23 NOTE — Assessment & Plan Note (Signed)
Blood pressure is well controlled on today's visit. No changes made to the medications. 

## 2014-09-23 NOTE — Assessment & Plan Note (Signed)
Doing well following his total knee replacement on the left Minimal leg edema

## 2014-09-24 DIAGNOSIS — R278 Other lack of coordination: Secondary | ICD-10-CM | POA: Diagnosis not present

## 2014-09-24 DIAGNOSIS — M25562 Pain in left knee: Secondary | ICD-10-CM | POA: Diagnosis not present

## 2014-09-24 DIAGNOSIS — M6281 Muscle weakness (generalized): Secondary | ICD-10-CM | POA: Diagnosis not present

## 2014-09-24 DIAGNOSIS — Z96652 Presence of left artificial knee joint: Secondary | ICD-10-CM | POA: Diagnosis not present

## 2014-09-24 DIAGNOSIS — R269 Unspecified abnormalities of gait and mobility: Secondary | ICD-10-CM | POA: Diagnosis not present

## 2014-09-25 ENCOUNTER — Ambulatory Visit (INDEPENDENT_AMBULATORY_CARE_PROVIDER_SITE_OTHER): Payer: Medicare Other | Admitting: Internal Medicine

## 2014-09-25 ENCOUNTER — Encounter: Payer: Self-pay | Admitting: Internal Medicine

## 2014-09-25 VITALS — BP 140/70 | HR 73 | Wt 232.0 lb

## 2014-09-25 DIAGNOSIS — Z96652 Presence of left artificial knee joint: Secondary | ICD-10-CM

## 2014-09-25 DIAGNOSIS — M25562 Pain in left knee: Secondary | ICD-10-CM | POA: Diagnosis not present

## 2014-09-25 DIAGNOSIS — R278 Other lack of coordination: Secondary | ICD-10-CM | POA: Diagnosis not present

## 2014-09-25 DIAGNOSIS — L03116 Cellulitis of left lower limb: Secondary | ICD-10-CM

## 2014-09-25 DIAGNOSIS — I6523 Occlusion and stenosis of bilateral carotid arteries: Secondary | ICD-10-CM | POA: Diagnosis not present

## 2014-09-25 DIAGNOSIS — R269 Unspecified abnormalities of gait and mobility: Secondary | ICD-10-CM | POA: Diagnosis not present

## 2014-09-25 DIAGNOSIS — M6281 Muscle weakness (generalized): Secondary | ICD-10-CM | POA: Diagnosis not present

## 2014-09-25 MED ORDER — CEPHALEXIN 500 MG PO CAPS: 500.0000 mg | ORAL_CAPSULE | Freq: Three times a day (TID) | ORAL | Status: DC

## 2014-09-25 NOTE — Progress Notes (Signed)
Pre visit review using our clinic review tool, if applicable. No additional management support is needed unless otherwise documented below in the visit note. 

## 2014-09-25 NOTE — Assessment & Plan Note (Signed)
Doing well post op Continues with the PT for now Not needing narcotics

## 2014-09-25 NOTE — Progress Notes (Signed)
Subjective:    Patient ID: Michael Colon, male    DOB: [birth year], 81 y.o.   MRN: [id number]  HPI Had left TKR and then to rehab I saw him there at [facility] 8 days ago Recent fine check up with cardiology  Done with OT Now continuing on PT  Had been on cephalexin for cellulitis laterally along the side of his knee of the replacement It does look better Has 1 dose left--concerned about relapse with weekend coming No fever  Current Outpatient Prescriptions on File Prior to Visit  Medication Sig Dispense Refill  . BAYER CONTOUR TEST test strip USE STRIP AS INSTRUCTED TO TEST BLOOD SUGAR TWICE DAILY 100 each 11  . cephALEXin (KEFLEX) 500 MG capsule Take 1 capsule (500 mg total) by mouth 3 (three) times daily. 21 capsule 0  . docusate sodium (COLACE) 100 MG capsule Take 100 mg by mouth 2 (two) times daily.     . famotidine (PEPCID) 20 MG tablet Take 20 mg by mouth daily.     . ferrous sulfate 325 (65 FE) MG tablet Take 1 tablet (325 mg total) by mouth 2 (two) times daily with a meal. 30 tablet 3  . finasteride (PROSCAR) 5 MG tablet TAKE ONE TABLET BY MOUTH ONCE DAILY 90 tablet 2  . fish oil-omega-3 fatty acids 1000 MG capsule Take 1 g by mouth daily.     . Glucosamine Sulfate-MSM (GLUCOSAMINE-MSM DS) 500-500 MG TABS Take 1,500 mg by mouth 2 (two) times daily.     Marland Kitchen glucose blood test strip Use as instructed to test blood sugar once daily dx: 250.00    . ibuprofen (ADVIL,MOTRIN) 200 MG tablet Take 400 mg by mouth 2 (two) times daily.    Marland Kitchen loratadine (CLARITIN) 10 MG tablet Take 10 mg by mouth daily.      . metFORMIN (GLUCOPHAGE) 500 MG tablet Take 500 mg by mouth 2 (two) times daily with a meal.    . MICROLET LANCETS MISC Use to test twice daily or as directed 100 each 3  . Multiple Vitamins-Minerals (CENTRUM SILVER PO) Take 1 tablet by mouth daily after breakfast.     . nitroGLYCERIN (NITROSTAT) 0.4 MG SL tablet Place 0.4 mg under the tongue every 5 (five) minutes as needed  for chest pain.    . tamsulosin (FLOMAX) 0.4 MG CAPS capsule Take 1 capsule (0.4 mg total) by mouth daily. 90 capsule 3  . triamterene-hydrochlorothiazide (DYAZIDE) 37.5-25 MG per capsule TAKE ONE CAPSULE BY MOUTH IN THE MORNING 90 capsule 3  . warfarin (COUMADIN) 5 MG tablet Take 5-7.5 mg by mouth See admin instructions. Take 7.5mg  on Monday, Tuesday, Wednesday, Thursday, Friday and Saturday. Take 5mg  on Sunday     No current facility-administered medications on file prior to visit.    Allergies  Allergen Reactions  . Enoxaparin Sodium Hives  . Latex Rash  . Nickel Rash  . Percocet [Oxycodone-Acetaminophen] Rash    Past Medical History  Diagnosis Date  . Chronic prostatitis   . Obstructive sleep apnea     CPAP-9  . Chronic ear infection   . Asthma   . GERD (gastroesophageal reflux disease)     laryngeal involvement  . Hypertension   . Osteoarthrosis, localized, primary, knee     post-traumatic  . Diabetes mellitus   . Tachy-brady syndrome   . Atrial flutter 2007  . Benign prostatic hypertrophy   . Hyperlipidemia   . Band keratopathy   . UTI (urinary tract  infection)   . Prostatitis   . Chronic kidney disease   . Stroke     TIA    Past Surgical History  Procedure Laterality Date  . Mastoidectomy  8/08    Dr Idelle Crouch  . Rhinoplasty  5/10    and septoplasty  . Cataract extraction, bilateral  2009  . Belpharoptosis repair      Dr [name]---didn't resolve weepy eye and eyelid drooping  . Knee surgery  1998    plate after fracture, then removed for infection  . Cardioversion  04/13/2010  . Chest pain  8/12    Stress test benign  . Appendectomy    . Left elbow surgery    . Venous ablation    . Tear duct probing  11/13    Dr Vickki Muff  . Subacromial decompression Right 2005    Arthroscopic (for rotator cuff and biceps tendon ruptures)  . Eye surgery    . Fracture surgery Left     elbow  . Total knee arthroplasty Left 09/01/2014    Procedure: TOTAL KNEE  ARTHROPLASTY;  Surgeon: Dereck Leep, MD;  Location: [facility] ORS;  Service: Orthopedics;  Laterality: Left;    Family History  Problem Relation Age of Onset  . Other Mother     natural causes  . Colon cancer Father   . Diabetes Father     Social History   Social History  . Marital Status: Married    Spouse Name: N/A  . Number of Children: 2  . 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 Smoking status: Former Smoker -- 1.00 packs/day for 0 years    Types: Cigarettes    Quit date: 01/03/1969  . Smokeless tobacco: Never Used  . Alcohol Use: No     Comment: rare wine  . Drug Use: No  . Sexual Activity: Not on file   Other Topics Concern  . Not on file   Social History Narrative   Has living will   Health care POA-- wife and then son Shanon Brow   Has DNR order from the past---form redone 06/25/10   Probably no feeding tube if cognitively unaware   Review of Systems  Still with discomfort but no bad pain No pain meds other than ibuprofen     Objective:   Physical Exam  Constitutional: He appears well-developed and well-nourished. No distress.  Skin:  Left knee incision is clean and dry with no discharge or inflammation Area of redness and warmth lateral and below incision (calf) and medial to incision above knee now both look normal without redness, warmth or tenderness          Assessment & Plan:

## 2014-09-25 NOTE — Assessment & Plan Note (Signed)
This appears to have resolved Will give Rx just in case it relapses over the weekend

## 2014-09-26 DIAGNOSIS — M6281 Muscle weakness (generalized): Secondary | ICD-10-CM | POA: Diagnosis not present

## 2014-09-26 DIAGNOSIS — R278 Other lack of coordination: Secondary | ICD-10-CM | POA: Diagnosis not present

## 2014-09-26 DIAGNOSIS — Z96652 Presence of left artificial knee joint: Secondary | ICD-10-CM | POA: Diagnosis not present

## 2014-09-26 DIAGNOSIS — R269 Unspecified abnormalities of gait and mobility: Secondary | ICD-10-CM | POA: Diagnosis not present

## 2014-09-26 DIAGNOSIS — M25562 Pain in left knee: Secondary | ICD-10-CM | POA: Diagnosis not present

## 2014-09-27 ENCOUNTER — Other Ambulatory Visit: Payer: Self-pay | Admitting: Cardiovascular Disease

## 2014-09-29 DIAGNOSIS — M6281 Muscle weakness (generalized): Secondary | ICD-10-CM | POA: Diagnosis not present

## 2014-09-29 DIAGNOSIS — R269 Unspecified abnormalities of gait and mobility: Secondary | ICD-10-CM | POA: Diagnosis not present

## 2014-09-29 DIAGNOSIS — R278 Other lack of coordination: Secondary | ICD-10-CM | POA: Diagnosis not present

## 2014-09-29 DIAGNOSIS — M25562 Pain in left knee: Secondary | ICD-10-CM | POA: Diagnosis not present

## 2014-09-29 DIAGNOSIS — Z96652 Presence of left artificial knee joint: Secondary | ICD-10-CM | POA: Diagnosis not present

## 2014-09-29 NOTE — Telephone Encounter (Signed)
Please review for refill, Thank you. 

## 2014-09-30 ENCOUNTER — Other Ambulatory Visit: Payer: Self-pay | Admitting: Cardiovascular Disease

## 2014-09-30 NOTE — Patient Outreach (Signed)
[facility]) Care Management  09/30/2014  Michael Colon May 07, 1930 [id number]   Notification from [place], RN to close case due to patient refused [place] Management services.  Thanks, Ronnell Freshwater. [place], [place] Assistant Phone: [phone] Fax: [phone]

## 2014-09-30 NOTE — Telephone Encounter (Signed)
Please review for refills. Thanks!  

## 2014-10-01 DIAGNOSIS — M6281 Muscle weakness (generalized): Secondary | ICD-10-CM | POA: Diagnosis not present

## 2014-10-01 DIAGNOSIS — R269 Unspecified abnormalities of gait and mobility: Secondary | ICD-10-CM | POA: Diagnosis not present

## 2014-10-01 DIAGNOSIS — R278 Other lack of coordination: Secondary | ICD-10-CM | POA: Diagnosis not present

## 2014-10-01 DIAGNOSIS — M25562 Pain in left knee: Secondary | ICD-10-CM | POA: Diagnosis not present

## 2014-10-01 DIAGNOSIS — Z96652 Presence of left artificial knee joint: Secondary | ICD-10-CM | POA: Diagnosis not present

## 2014-10-02 DIAGNOSIS — Z23 Encounter for immunization: Secondary | ICD-10-CM | POA: Diagnosis not present

## 2014-10-03 DIAGNOSIS — R278 Other lack of coordination: Secondary | ICD-10-CM | POA: Diagnosis not present

## 2014-10-03 DIAGNOSIS — M25562 Pain in left knee: Secondary | ICD-10-CM | POA: Diagnosis not present

## 2014-10-03 DIAGNOSIS — M6281 Muscle weakness (generalized): Secondary | ICD-10-CM | POA: Diagnosis not present

## 2014-10-03 DIAGNOSIS — Z96652 Presence of left artificial knee joint: Secondary | ICD-10-CM | POA: Diagnosis not present

## 2014-10-03 DIAGNOSIS — R269 Unspecified abnormalities of gait and mobility: Secondary | ICD-10-CM | POA: Diagnosis not present

## 2014-10-06 DIAGNOSIS — R269 Unspecified abnormalities of gait and mobility: Secondary | ICD-10-CM | POA: Diagnosis not present

## 2014-10-06 DIAGNOSIS — M25562 Pain in left knee: Secondary | ICD-10-CM | POA: Diagnosis not present

## 2014-10-06 DIAGNOSIS — M6281 Muscle weakness (generalized): Secondary | ICD-10-CM | POA: Diagnosis not present

## 2014-10-08 ENCOUNTER — Ambulatory Visit (INDEPENDENT_AMBULATORY_CARE_PROVIDER_SITE_OTHER): Payer: Medicare Other

## 2014-10-08 DIAGNOSIS — I4892 Unspecified atrial flutter: Secondary | ICD-10-CM | POA: Diagnosis not present

## 2014-10-08 DIAGNOSIS — M25562 Pain in left knee: Secondary | ICD-10-CM | POA: Diagnosis not present

## 2014-10-08 DIAGNOSIS — Z5181 Encounter for therapeutic drug level monitoring: Secondary | ICD-10-CM

## 2014-10-08 DIAGNOSIS — R269 Unspecified abnormalities of gait and mobility: Secondary | ICD-10-CM | POA: Diagnosis not present

## 2014-10-08 DIAGNOSIS — Z7901 Long term (current) use of anticoagulants: Secondary | ICD-10-CM

## 2014-10-08 DIAGNOSIS — M6281 Muscle weakness (generalized): Secondary | ICD-10-CM | POA: Diagnosis not present

## 2014-10-08 LAB — POCT INR: INR: 2.2

## 2014-10-10 DIAGNOSIS — M25562 Pain in left knee: Secondary | ICD-10-CM | POA: Diagnosis not present

## 2014-10-10 DIAGNOSIS — R269 Unspecified abnormalities of gait and mobility: Secondary | ICD-10-CM | POA: Diagnosis not present

## 2014-10-10 DIAGNOSIS — M6281 Muscle weakness (generalized): Secondary | ICD-10-CM | POA: Diagnosis not present

## 2014-10-13 DIAGNOSIS — R269 Unspecified abnormalities of gait and mobility: Secondary | ICD-10-CM | POA: Diagnosis not present

## 2014-10-13 DIAGNOSIS — M25562 Pain in left knee: Secondary | ICD-10-CM | POA: Diagnosis not present

## 2014-10-13 DIAGNOSIS — M6281 Muscle weakness (generalized): Secondary | ICD-10-CM | POA: Diagnosis not present

## 2014-10-14 DIAGNOSIS — L851 Acquired keratosis [keratoderma] palmaris et plantaris: Secondary | ICD-10-CM | POA: Diagnosis not present

## 2014-10-14 DIAGNOSIS — Z96652 Presence of left artificial knee joint: Secondary | ICD-10-CM | POA: Diagnosis not present

## 2014-10-14 DIAGNOSIS — E1142 Type 2 diabetes mellitus with diabetic polyneuropathy: Secondary | ICD-10-CM | POA: Diagnosis not present

## 2014-10-14 DIAGNOSIS — B351 Tinea unguium: Secondary | ICD-10-CM | POA: Diagnosis not present

## 2014-10-14 DIAGNOSIS — N400 Enlarged prostate without lower urinary tract symptoms: Secondary | ICD-10-CM | POA: Insufficient documentation

## 2014-10-15 DIAGNOSIS — M25562 Pain in left knee: Secondary | ICD-10-CM | POA: Diagnosis not present

## 2014-10-15 DIAGNOSIS — M6281 Muscle weakness (generalized): Secondary | ICD-10-CM | POA: Diagnosis not present

## 2014-10-15 DIAGNOSIS — R269 Unspecified abnormalities of gait and mobility: Secondary | ICD-10-CM | POA: Diagnosis not present

## 2014-10-17 DIAGNOSIS — M25562 Pain in left knee: Secondary | ICD-10-CM | POA: Diagnosis not present

## 2014-10-17 DIAGNOSIS — R269 Unspecified abnormalities of gait and mobility: Secondary | ICD-10-CM | POA: Diagnosis not present

## 2014-10-17 DIAGNOSIS — M6281 Muscle weakness (generalized): Secondary | ICD-10-CM | POA: Diagnosis not present

## 2014-10-18 ENCOUNTER — Encounter: Payer: Self-pay | Admitting: Internal Medicine

## 2014-10-20 MED ORDER — METFORMIN HCL 500 MG PO TABS: 500.0000 mg | ORAL_TABLET | Freq: Two times a day (BID) | ORAL | Status: DC

## 2014-10-20 NOTE — Telephone Encounter (Signed)
Spoke with pharmacist at [facility] and this was a handwritten prescription from healthcare that Dr. Silvio Pate wrote, not sure if patient should still be on this or not?

## 2014-10-20 NOTE — Telephone Encounter (Signed)
Left message on machine with results. rx sent to pharmacy by e-script

## 2014-10-20 NOTE — Telephone Encounter (Signed)
He should continue the current dose and refill for a year please

## 2014-10-20 NOTE — Addendum Note (Signed)
Addended by: Despina Hidden on: 10/20/2014 04:33 PM   Modules accepted: Orders

## 2014-10-21 DIAGNOSIS — M6281 Muscle weakness (generalized): Secondary | ICD-10-CM | POA: Diagnosis not present

## 2014-10-21 DIAGNOSIS — M25562 Pain in left knee: Secondary | ICD-10-CM | POA: Diagnosis not present

## 2014-10-21 DIAGNOSIS — R269 Unspecified abnormalities of gait and mobility: Secondary | ICD-10-CM | POA: Diagnosis not present

## 2014-10-22 DIAGNOSIS — M25562 Pain in left knee: Secondary | ICD-10-CM | POA: Diagnosis not present

## 2014-10-22 DIAGNOSIS — R269 Unspecified abnormalities of gait and mobility: Secondary | ICD-10-CM | POA: Diagnosis not present

## 2014-10-22 DIAGNOSIS — M6281 Muscle weakness (generalized): Secondary | ICD-10-CM | POA: Diagnosis not present

## 2014-10-23 DIAGNOSIS — E119 Type 2 diabetes mellitus without complications: Secondary | ICD-10-CM | POA: Diagnosis not present

## 2014-10-23 LAB — HM DIABETES EYE EXAM

## 2014-10-24 DIAGNOSIS — R269 Unspecified abnormalities of gait and mobility: Secondary | ICD-10-CM | POA: Diagnosis not present

## 2014-10-24 DIAGNOSIS — M6281 Muscle weakness (generalized): Secondary | ICD-10-CM | POA: Diagnosis not present

## 2014-10-24 DIAGNOSIS — M25562 Pain in left knee: Secondary | ICD-10-CM | POA: Diagnosis not present

## 2014-10-27 DIAGNOSIS — R269 Unspecified abnormalities of gait and mobility: Secondary | ICD-10-CM | POA: Diagnosis not present

## 2014-10-27 DIAGNOSIS — M6281 Muscle weakness (generalized): Secondary | ICD-10-CM | POA: Diagnosis not present

## 2014-10-27 DIAGNOSIS — M25562 Pain in left knee: Secondary | ICD-10-CM | POA: Diagnosis not present

## 2014-10-29 ENCOUNTER — Telehealth: Payer: Self-pay | Admitting: Internal Medicine

## 2014-10-29 DIAGNOSIS — M25562 Pain in left knee: Secondary | ICD-10-CM | POA: Diagnosis not present

## 2014-10-29 DIAGNOSIS — M6281 Muscle weakness (generalized): Secondary | ICD-10-CM | POA: Diagnosis not present

## 2014-10-29 DIAGNOSIS — R269 Unspecified abnormalities of gait and mobility: Secondary | ICD-10-CM | POA: Diagnosis not present

## 2014-10-29 NOTE — Telephone Encounter (Signed)
He actually was due for a urine in June but I missed it. I think it is okay to wait for his December appt and I will order it with his labs then

## 2014-10-29 NOTE — Telephone Encounter (Signed)
Pt looked at my chart -  Says he does not need a urine protein test, he was admitted to [facility] and feels certain that they did one there  He also had a diabetic eye exam a few days ago at [facility]  He would like for these things to be updated in my chart  cb number is [phone] Thank you

## 2014-10-29 NOTE — Telephone Encounter (Signed)
Spoke with patient and advised results   

## 2014-10-29 NOTE — Telephone Encounter (Signed)
Eye Exam entered in EMR.  Health Maintenance Updated.  Last Urine Microalbumin 06/2013.  Please advise if patient needs another Microalbumin.

## 2014-10-30 DIAGNOSIS — R269 Unspecified abnormalities of gait and mobility: Secondary | ICD-10-CM | POA: Diagnosis not present

## 2014-10-30 DIAGNOSIS — M6281 Muscle weakness (generalized): Secondary | ICD-10-CM | POA: Diagnosis not present

## 2014-10-30 DIAGNOSIS — M25562 Pain in left knee: Secondary | ICD-10-CM | POA: Diagnosis not present

## 2014-11-04 ENCOUNTER — Other Ambulatory Visit: Payer: Self-pay | Admitting: Cardiovascular Disease

## 2014-11-05 ENCOUNTER — Ambulatory Visit (INDEPENDENT_AMBULATORY_CARE_PROVIDER_SITE_OTHER): Payer: Medicare Other

## 2014-11-05 DIAGNOSIS — Z5181 Encounter for therapeutic drug level monitoring: Secondary | ICD-10-CM | POA: Diagnosis not present

## 2014-11-05 DIAGNOSIS — Z7901 Long term (current) use of anticoagulants: Secondary | ICD-10-CM

## 2014-11-05 DIAGNOSIS — I4892 Unspecified atrial flutter: Secondary | ICD-10-CM

## 2014-11-05 LAB — POCT INR: INR: 2.4

## 2014-11-11 ENCOUNTER — Other Ambulatory Visit: Payer: Self-pay

## 2014-11-13 DIAGNOSIS — M25562 Pain in left knee: Secondary | ICD-10-CM | POA: Diagnosis not present

## 2014-11-13 DIAGNOSIS — M6281 Muscle weakness (generalized): Secondary | ICD-10-CM | POA: Diagnosis not present

## 2014-11-13 DIAGNOSIS — R269 Unspecified abnormalities of gait and mobility: Secondary | ICD-10-CM | POA: Diagnosis not present

## 2014-11-14 DIAGNOSIS — R269 Unspecified abnormalities of gait and mobility: Secondary | ICD-10-CM | POA: Diagnosis not present

## 2014-11-14 DIAGNOSIS — M6281 Muscle weakness (generalized): Secondary | ICD-10-CM | POA: Diagnosis not present

## 2014-11-14 DIAGNOSIS — M25562 Pain in left knee: Secondary | ICD-10-CM | POA: Diagnosis not present

## 2014-11-17 DIAGNOSIS — R269 Unspecified abnormalities of gait and mobility: Secondary | ICD-10-CM | POA: Diagnosis not present

## 2014-11-17 DIAGNOSIS — M25562 Pain in left knee: Secondary | ICD-10-CM | POA: Diagnosis not present

## 2014-11-17 DIAGNOSIS — M6281 Muscle weakness (generalized): Secondary | ICD-10-CM | POA: Diagnosis not present

## 2014-11-18 DIAGNOSIS — M6281 Muscle weakness (generalized): Secondary | ICD-10-CM | POA: Diagnosis not present

## 2014-11-18 DIAGNOSIS — M25562 Pain in left knee: Secondary | ICD-10-CM | POA: Diagnosis not present

## 2014-11-18 DIAGNOSIS — R269 Unspecified abnormalities of gait and mobility: Secondary | ICD-10-CM | POA: Diagnosis not present

## 2014-11-24 DIAGNOSIS — R269 Unspecified abnormalities of gait and mobility: Secondary | ICD-10-CM | POA: Diagnosis not present

## 2014-11-24 DIAGNOSIS — M6281 Muscle weakness (generalized): Secondary | ICD-10-CM | POA: Diagnosis not present

## 2014-11-24 DIAGNOSIS — M25562 Pain in left knee: Secondary | ICD-10-CM | POA: Diagnosis not present

## 2014-11-26 DIAGNOSIS — R269 Unspecified abnormalities of gait and mobility: Secondary | ICD-10-CM | POA: Diagnosis not present

## 2014-11-26 DIAGNOSIS — M6281 Muscle weakness (generalized): Secondary | ICD-10-CM | POA: Diagnosis not present

## 2014-11-26 DIAGNOSIS — M25562 Pain in left knee: Secondary | ICD-10-CM | POA: Diagnosis not present

## 2014-12-01 DIAGNOSIS — M25562 Pain in left knee: Secondary | ICD-10-CM | POA: Diagnosis not present

## 2014-12-01 DIAGNOSIS — R269 Unspecified abnormalities of gait and mobility: Secondary | ICD-10-CM | POA: Diagnosis not present

## 2014-12-01 DIAGNOSIS — M6281 Muscle weakness (generalized): Secondary | ICD-10-CM | POA: Diagnosis not present

## 2014-12-04 ENCOUNTER — Encounter: Payer: Self-pay | Admitting: Family Medicine

## 2014-12-04 ENCOUNTER — Ambulatory Visit (INDEPENDENT_AMBULATORY_CARE_PROVIDER_SITE_OTHER): Payer: Medicare Other | Admitting: Family Medicine

## 2014-12-04 ENCOUNTER — Telehealth: Payer: Self-pay | Admitting: Internal Medicine

## 2014-12-04 VITALS — BP 120/86 | HR 67 | Temp 97.6°F | Ht 71.0 in | Wt 236.5 lb

## 2014-12-04 DIAGNOSIS — H8110 Benign paroxysmal vertigo, unspecified ear: Secondary | ICD-10-CM | POA: Diagnosis not present

## 2014-12-04 DIAGNOSIS — I6523 Occlusion and stenosis of bilateral carotid arteries: Secondary | ICD-10-CM | POA: Diagnosis not present

## 2014-12-04 MED ORDER — MECLIZINE HCL 25 MG PO TABS: 25.0000 mg | ORAL_TABLET | Freq: Three times a day (TID) | ORAL | Status: DC | PRN

## 2014-12-04 NOTE — Telephone Encounter (Signed)
Patient Name: NICOLAS ZIBELL DOB: Jun 29, 1930 Initial Comment Caller States husband woke up with severe vertigo. waves of nausea and dizziness when he walks or moves his head. Nurse Assessment Nurse: Vallery Sa, RN, Cathy Date/Time (Eastern Time): 12/04/2014 8:48:49 AM Confirm and document reason for call. If symptomatic, describe symptoms. ---Caller states her husband developed severe Vertigo this morning. No severe breathing difficulty. No chest pain. No injury in the past 3 days. Alert and responsive. Has the patient traveled out of the country within the last 30 days? ---No Does the patient have any new or worsening symptoms? ---Yes Will a triage be completed? ---Yes Related visit to physician within the last 2 weeks? ---No Does the PT have any chronic conditions? (i.e. diabetes, asthma, etc.) ---Yes List chronic conditions. ---Diabetes, Heart Flutter, Chronic Prostatitis Is this a behavioral health call? ---No Guidelines Guideline Title Affirmed Question Affirmed Notes Dizziness - Vertigo [1] Dizziness (vertigo) present now AND [2] one or more stroke risk factors (i.e., hypertension, diabetes, prior stroke/TIA/ heart attack) (Exception: prior physician evaluation for this AND no different/worse than usual) Final Disposition User Go to ED Now (or PCP triage) Vallery Sa, RN, Cathy Comments Caller states they don't want to go to the ER. Scheduled for 10:15am appointment with Dr. Lacinda Axon at [facility] office per request. Referrals GO TO FACILITY OTHER - SPECIFY Disagree/Comply: Comply

## 2014-12-04 NOTE — Progress Notes (Signed)
Subjective:  Patient ID: Michael Colon, male    DOB: 1930-12-17  Age: 81 y.o. MRN: [id number]  CC: Vertigo  HPI:  81 year old male with a competent past medical history including tachybradycardia syndrome, TIA, DM 2, hypertension, hyperlipidemia presents to clinic today with complaints of vertigo.  Vertigo  Patient reports that last night he developed dizziness.  He describes it as the room spinning.  Worse with certain movements.  No relieving factors.  He reports associated nausea but denies vomiting.  He reports that as a result of his dizziness he was experiencing some difficulty with ambulation.  He denies any associated vision changes, focal weakness, slurred speech.  He has not taken any medications or tried any interventions for this.  Social Hx   Social History   Social History  . Marital Status: Married    Spouse Name: N/A  . Number of Children: 2  . 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 Smoking status: Former Smoker -- 1.00 packs/day for 0 years    Types: Cigarettes    Quit date: 01/03/1969  . Smokeless tobacco: Never Used  . Alcohol Use: No     Comment: rare wine  . Drug Use: No  . Sexual Activity: Not Asked   Other Topics Concern  . None   Social History Narrative   Has living will   Health care POA-- wife and then son Shanon Brow   Has DNR order from the past---form redone 06/25/10   Probably no feeding tube if cognitively unaware   Review of Systems  Constitutional: Negative.   Eyes: Negative for visual disturbance.  Neurological: Positive for dizziness. Negative for weakness.   Objective:  BP 120/86 mmHg  Pulse 67  Temp(Src) 97.6 F (36.4 C) (Oral)  Ht 5\' 11"  (1.803 m)  Wt 236 lb 8 oz (107.276 kg)  BMI 33.00 kg/m2  SpO2 97%  BP/Weight 12/04/2014 09/25/2014 123456  Systolic BP 123456 [id number]  Diastolic BP 86 70 80  Wt. (Lbs) 236.5 232 232.75  BMI 33 32.37  32.48   Physical Exam  Constitutional: He is oriented to person, place, and time. He appears well-developed. No distress.  HENT:  Head: Normocephalic and atraumatic.  Right TM - landmarks are not clear. There is some erythema. No evidence of bulging or purulence. Of note, patient has had recurrent infections of this ear in the past.  Left TM normal.  Eyes: Pupils are equal, round, and reactive to light.  Cardiovascular: Normal rate and regular rhythm.   Pulmonary/Chest: Effort normal and breath sounds normal.  Abdominal: Soft. He exhibits no distension. There is no tenderness.  Neurological: He is alert and oriented to person, place, and time.  Cranial nerves II through XII intact. Muscle strength 5/5 in all extremity. No focal deficits.  Psychiatric: He has a normal mood and affect.  Vitals reviewed.  Assessment & Plan:   Problem List Items Addressed This Visit    BPPV (benign paroxysmal positional vertigo) - Primary    New problem. Normal neurological exam today. Given history and normal exam, this is likely BPPV. Treating with meclizine. Also sending to neurovestibular rehabilitation.      Relevant Orders   Ambulatory referral to Physical Therapy     Meds ordered this encounter  Medications  . meclizine (ANTIVERT) 25 MG tablet    Sig: Take 1 tablet (25 mg total) by mouth 3 (three) times  daily as needed for dizziness.    Dispense:  30 tablet    Refill:  0   Follow-up: No Follow-up on file.  [place]

## 2014-12-04 NOTE — Progress Notes (Signed)
Pre visit review using our clinic review tool, if applicable. No additional management support is needed unless otherwise documented below in the visit note. 

## 2014-12-04 NOTE — Telephone Encounter (Signed)
Pt has appt 12/04/14 at 10:15 with Dr Thersa Salt.

## 2014-12-04 NOTE — Patient Instructions (Signed)
Use the meclizine as prescribed.  We will call you regarding the physical therapy.  Follow-up if you fail to improve or worsen  Take care  Dr. Lacinda Axon.

## 2014-12-04 NOTE — Assessment & Plan Note (Signed)
New problem. Normal neurological exam today. Given history and normal exam, this is likely BPPV. Treating with meclizine. Also sending to neurovestibular rehabilitation.

## 2014-12-08 DIAGNOSIS — L0212 Furuncle of neck: Secondary | ICD-10-CM | POA: Diagnosis not present

## 2014-12-08 DIAGNOSIS — X32XXXA Exposure to sunlight, initial encounter: Secondary | ICD-10-CM | POA: Diagnosis not present

## 2014-12-08 DIAGNOSIS — M2170 Unequal limb length (acquired), unspecified site: Secondary | ICD-10-CM | POA: Diagnosis not present

## 2014-12-08 DIAGNOSIS — L570 Actinic keratosis: Secondary | ICD-10-CM | POA: Diagnosis not present

## 2014-12-08 DIAGNOSIS — L0292 Furuncle, unspecified: Secondary | ICD-10-CM | POA: Diagnosis not present

## 2014-12-08 DIAGNOSIS — L821 Other seborrheic keratosis: Secondary | ICD-10-CM | POA: Diagnosis not present

## 2014-12-11 ENCOUNTER — Other Ambulatory Visit: Payer: Self-pay | Admitting: Internal Medicine

## 2014-12-11 DIAGNOSIS — H8110 Benign paroxysmal vertigo, unspecified ear: Secondary | ICD-10-CM | POA: Diagnosis not present

## 2014-12-11 DIAGNOSIS — R2681 Unsteadiness on feet: Secondary | ICD-10-CM | POA: Diagnosis not present

## 2014-12-17 ENCOUNTER — Ambulatory Visit (INDEPENDENT_AMBULATORY_CARE_PROVIDER_SITE_OTHER): Payer: Medicare Other

## 2014-12-17 DIAGNOSIS — Z7901 Long term (current) use of anticoagulants: Secondary | ICD-10-CM | POA: Diagnosis not present

## 2014-12-17 DIAGNOSIS — I4892 Unspecified atrial flutter: Secondary | ICD-10-CM | POA: Diagnosis not present

## 2014-12-17 DIAGNOSIS — Z5181 Encounter for therapeutic drug level monitoring: Secondary | ICD-10-CM | POA: Diagnosis not present

## 2014-12-17 LAB — POCT INR: INR: 2.1

## 2014-12-23 ENCOUNTER — Encounter: Payer: Self-pay | Admitting: Internal Medicine

## 2014-12-23 ENCOUNTER — Ambulatory Visit (INDEPENDENT_AMBULATORY_CARE_PROVIDER_SITE_OTHER): Payer: Medicare Other | Admitting: Internal Medicine

## 2014-12-23 VITALS — BP 130/80 | HR 67 | Temp 97.5°F | Ht 71.0 in | Wt 236.0 lb

## 2014-12-23 DIAGNOSIS — E785 Hyperlipidemia, unspecified: Secondary | ICD-10-CM

## 2014-12-23 DIAGNOSIS — I10 Essential (primary) hypertension: Secondary | ICD-10-CM | POA: Diagnosis not present

## 2014-12-23 DIAGNOSIS — N400 Enlarged prostate without lower urinary tract symptoms: Secondary | ICD-10-CM | POA: Diagnosis not present

## 2014-12-23 DIAGNOSIS — I4892 Unspecified atrial flutter: Secondary | ICD-10-CM | POA: Diagnosis not present

## 2014-12-23 DIAGNOSIS — Z7189 Other specified counseling: Secondary | ICD-10-CM

## 2014-12-23 DIAGNOSIS — I6523 Occlusion and stenosis of bilateral carotid arteries: Secondary | ICD-10-CM | POA: Diagnosis not present

## 2014-12-23 DIAGNOSIS — E119 Type 2 diabetes mellitus without complications: Secondary | ICD-10-CM | POA: Diagnosis not present

## 2014-12-23 DIAGNOSIS — Z0000 Encounter for general adult medical examination without abnormal findings: Secondary | ICD-10-CM | POA: Diagnosis not present

## 2014-12-23 LAB — LIPID PANEL
Cholesterol: 214 mg/dL — ABNORMAL HIGH (ref 0–200)
HDL: 54.6 mg/dL (ref >39.00)
LDL Cholesterol: 130 mg/dL — ABNORMAL HIGH (ref 0–99)
NonHDL: 159.37
Total CHOL/HDL Ratio: 4
Triglycerides: 149 mg/dL (ref 0.0–149.0)
VLDL: 29.8 mg/dL (ref 0.0–40.0)

## 2014-12-23 LAB — CBC WITH DIFFERENTIAL/PLATELET
Basophils Absolute: 0 10*3/uL (ref 0.0–0.1)
Basophils Relative: 0.5 % (ref 0.0–3.0)
Eosinophils Absolute: 0.4 10*3/uL (ref 0.0–0.7)
Eosinophils Relative: 5.5 % — ABNORMAL HIGH (ref 0.0–5.0)
HCT: 49.7 % (ref 39.0–52.0)
Hemoglobin: 16.6 g/dL (ref 13.0–17.0)
Lymphocytes Relative: 18.6 % (ref 12.0–46.0)
Lymphs Abs: 1.4 10*3/uL (ref 0.7–4.0)
MCHC: 33.4 g/dL (ref 30.0–36.0)
MCV: 90.3 fl (ref 78.0–100.0)
Monocytes Absolute: 0.9 10*3/uL (ref 0.1–1.0)
Monocytes Relative: 12.4 % — ABNORMAL HIGH (ref 3.0–12.0)
Neutro Abs: 4.6 10*3/uL (ref 1.4–7.7)
Neutrophils Relative %: 63 % (ref 43.0–77.0)
Platelets: 127 10*3/uL — ABNORMAL LOW (ref 150.0–400.0)
RBC: 5.5 Mil/uL (ref 4.22–5.81)
RDW: 13.6 % (ref 11.5–15.5)
WBC: 7.4 10*3/uL (ref 4.0–10.5)

## 2014-12-23 LAB — COMPREHENSIVE METABOLIC PANEL
ALT: 13 U/L (ref 0–53)
AST: 16 U/L (ref 0–37)
Albumin: 3.8 g/dL (ref 3.5–5.2)
Alkaline Phosphatase: 62 U/L (ref 39–117)
BUN: 28 mg/dL — ABNORMAL HIGH (ref 6–23)
CO2: 29 mEq/L (ref 19–32)
Calcium: 10.6 mg/dL — ABNORMAL HIGH (ref 8.4–10.5)
Chloride: 104 mEq/L (ref 96–112)
Creatinine, Ser: 0.8 mg/dL (ref 0.40–1.50)
GFR: 98.34 mL/min (ref >60.00)
Glucose, Bld: 185 mg/dL — ABNORMAL HIGH (ref 70–99)
Potassium: 3.8 mEq/L (ref 3.5–5.1)
Sodium: 139 mEq/L (ref 135–145)
Total Bilirubin: 0.5 mg/dL (ref 0.2–1.2)
Total Protein: 6.8 g/dL (ref 6.0–8.3)

## 2014-12-23 LAB — HM DIABETES FOOT EXAM

## 2014-12-23 LAB — HEMOGLOBIN A1C: Hgb A1c MFr Bld: 7.5 % — ABNORMAL HIGH (ref 4.6–6.5)

## 2014-12-23 LAB — T4, FREE: Free T4: 0.82 ng/dL (ref 0.60–1.60)

## 2014-12-23 NOTE — Assessment & Plan Note (Signed)
See social history °Has DNR °

## 2014-12-23 NOTE — Assessment & Plan Note (Signed)
BP Readings from Last 3 Encounters:  12/23/14 130/80  12/04/14 120/86  09/25/14 140/70   Good control No changes

## 2014-12-23 NOTE — Assessment & Plan Note (Signed)
Mildly elevated He prefers no meds unless much higher

## 2014-12-23 NOTE — Assessment & Plan Note (Signed)
Voiding okay

## 2014-12-23 NOTE — Progress Notes (Signed)
Pre visit review using our clinic review tool, if applicable. No additional management support is needed unless otherwise documented below in the visit note. 

## 2014-12-23 NOTE — Assessment & Plan Note (Signed)
I have personally reviewed the Medicare Annual Wellness questionnaire and have noted 1. The patient's medical and social history 2. Their use of alcohol, tobacco or illicit drugs 3. Their current medications and supplements 4. The patient's functional ability including ADL's, fall risks, home safety risks and hearing or visual             impairment. 5. Diet and physical activities 6. Evidence for depression or mood disorders  The patients weight, height, BMI and visual acuity have been recorded in the chart I have made referrals, counseling and provided education to the patient based review of the above and I have provided the pt with a written personalized care plan for preventive services.  I have provided you with a copy of your personalized plan for preventive services. Please take the time to review along with your updated medication list.  No cancer screening due to age UTD on immunizations Discussed fitness and lifestyle

## 2014-12-23 NOTE — Assessment & Plan Note (Signed)
Hopefully still reasonably controlled At his age, would accept up to 9%

## 2014-12-23 NOTE — Progress Notes (Signed)
Subjective:    Patient ID: Michael Colon, male    DOB: December 26, 1930, 81 y.o.   MRN: [id number]  HPI Here for Medicare wellness and follow up of chronic medical conditions Reviewed form and advanced directives Reviewed other doctors No alcohol or tobacco Tries to exercise Vision is cloudy--- due to keratotic band Hearing is not great--can't wear aides due to infections when he does Independent with instrumental ADLs No falls No depression or anhedonia No apparent significant memory problems  Continues in recovery from left TKR Walking without cane Some restriction in ROM No pain issues  Did have a spell of vertigo Not constant but persists Did try some maneuvers from PT---no clear help Couldn't tolerate meclizine --- felt "loopy headed" Has "drunk walk" when the dizziness comes on--- but is improved  Checks sugars every few days 120-140 fasting No hypoglycemic reactions No numbness, sores or pain in feet  No palpitaitons Continues on the coumadin No chest pain No SOB Still slight left leg swelling but better---since the surgery  Voiding okay Nocturia is rare now No daytime problems--stable urgency  Current Outpatient Prescriptions on File Prior to Visit  Medication Sig Dispense Refill  . BAYER CONTOUR TEST test strip USE STRIP AS INSTRUCTED TO TEST BLOOD SUGAR TWICE DAILY 100 each 11  . docusate sodium (COLACE) 100 MG capsule Take 100 mg by mouth 2 (two) times daily.     . famotidine (PEPCID) 20 MG tablet Take 20 mg by mouth daily.     . ferrous sulfate 325 (65 FE) MG tablet Take 1 tablet (325 mg total) by mouth 2 (two) times daily with a meal. 30 tablet 3  . finasteride (PROSCAR) 5 MG tablet TAKE ONE TABLET BY MOUTH ONCE DAILY 90 tablet 2  . fish oil-omega-3 fatty acids 1000 MG capsule Take 1 g by mouth daily.     . Glucosamine Sulfate-MSM (GLUCOSAMINE-MSM DS) 500-500 MG TABS Take 1,500 mg by mouth 2 (two) times daily.     Marland Kitchen glucose blood test strip Use as  instructed to test blood sugar once daily dx: 250.00    . ibuprofen (ADVIL,MOTRIN) 200 MG tablet Take 400 mg by mouth 2 (two) times daily.    Marland Kitchen loratadine (CLARITIN) 10 MG tablet Take 10 mg by mouth daily.      . metFORMIN (GLUCOPHAGE) 500 MG tablet Take 1 tablet (500 mg total) by mouth 2 (two) times daily with a meal. 180 tablet 3  . MICROLET LANCETS MISC Use to test twice daily or as directed 100 each 3  . Multiple Vitamins-Minerals (CENTRUM SILVER PO) Take 1 tablet by mouth daily after breakfast.     . nitroGLYCERIN (NITROSTAT) 0.4 MG SL tablet Place 0.4 mg under the tongue every 5 (five) minutes as needed for chest pain.    . tamsulosin (FLOMAX) 0.4 MG CAPS capsule TAKE ONE CAPSULE BY MOUTH ONCE DAILY 90 capsule 0  . triamterene-hydrochlorothiazide (DYAZIDE) 37.5-25 MG capsule TAKE ONE CAPSULE BY MOUTH IN THE MORNING 90 capsule 3  . warfarin (COUMADIN) 5 MG tablet TAKE ONE TO ONE & ONE-HALF TABLETS BY MOUTH ONCE DAILY AS DIRECTED 135 tablet 1   No current facility-administered medications on file prior to visit.    Allergies  Allergen Reactions  . Enoxaparin Sodium Hives  . Latex Rash  . Nickel Rash  . Percocet [Oxycodone-Acetaminophen] Rash    Past Medical History  Diagnosis Date  . Chronic prostatitis   . Obstructive sleep apnea     CPAP-9  .  Chronic ear infection   . Asthma   . GERD (gastroesophageal reflux disease)     laryngeal involvement  . Hypertension   . Osteoarthrosis, localized, primary, knee     post-traumatic  . Diabetes mellitus   . Tachy-brady syndrome ([place])   . Atrial flutter ([place]) 2007  . Benign prostatic hypertrophy   . Hyperlipidemia   . Band keratopathy   . UTI (urinary tract infection)   . Prostatitis   . Chronic kidney disease   . Stroke [facility])     TIA    Past Surgical History  Procedure Laterality Date  . Mastoidectomy  8/08    Dr Idelle Crouch  . Rhinoplasty  5/10    and septoplasty  . Cataract extraction, bilateral  2009  . Belpharoptosis  repair      Dr [name]---didn't resolve weepy eye and eyelid drooping  . Knee surgery  1998    plate after fracture, then removed for infection  . Cardioversion  04/13/2010  . Chest pain  8/12    Stress test benign  . Appendectomy    . Left elbow surgery    . Venous ablation    . Tear duct probing  11/13    Dr Vickki Muff  . Subacromial decompression Right 2005    Arthroscopic (for rotator cuff and biceps tendon ruptures)  . Eye surgery    . Fracture surgery Left     elbow  . Total knee arthroplasty Left 09/01/2014    Procedure: TOTAL KNEE ARTHROPLASTY;  Surgeon: Dereck Leep, MD;  Location: [facility] ORS;  Service: Orthopedics;  Laterality: Left;    Family History  Problem Relation Age of Onset  . Other Mother     natural causes  . Colon cancer Father   . Diabetes Father     Social History   Social History  . Marital Status: Married    Spouse Name: N/A  . Number of Children: 2  . 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 Smoking status: Former Smoker -- 1.00 packs/day for 0 years    Types: Cigarettes    Quit date: 01/03/1969  . Smokeless tobacco: Never Used  . Alcohol Use: No     Comment: rare wine  . Drug Use: No  . Sexual Activity: Not on file   Other Topics Concern  . Not on file   Social History Narrative   Has living will   Health care POA-- wife and then son Shanon Brow   Has DNR order from the past---form redone 06/25/10   Probably no feeding tube if cognitively unaware   Review of Systems Recent derm evaluation--everything okay Trigger finger in left 3rd--going to surgeon to reevaluate Appetite is good Weight is stable Sleeps well Bowels are fine. No blood in stool Still with arthritis in right hand mostly--- still on 2 ibuprofen bid for all joint issues Takes famotidine at night. No heartburn with this. Has pressure in throat if he leans over--- also will have some choking with swallowing  at times. Not really bothersome    Objective:   Physical Exam  Constitutional: He is oriented to person, place, and time. He appears well-developed and well-nourished. No distress.  HENT:  Mouth/Throat: Oropharynx is clear and moist. No oropharyngeal exudate.  Neck: Normal range of motion. Neck supple. No thyromegaly present.  Cardiovascular: Normal rate, regular rhythm, normal heart sounds and intact distal pulses.  Exam reveals  no gallop.   No murmur heard. Pulmonary/Chest: Effort normal and breath sounds normal. No respiratory distress. He has no wheezes. He has no rales.  Abdominal: Soft. There is no tenderness.  Musculoskeletal:  1+ edema in left leg, trace on right  Lymphadenopathy:    He has no cervical adenopathy.  Neurological: He is alert and oriented to person, place, and time.  President-- "Obama, Clyda Greener (Hillary's husband) Tarri Fuller" [phone] D-[id number] Recall 3/3  Normal sensation in feet  Skin: No rash noted. No erythema.  No foot lesions  Psychiatric: He has a normal mood and affect. His behavior is normal.          Assessment & Plan:

## 2014-12-23 NOTE — Assessment & Plan Note (Signed)
Controlled rate Continues on the coumadin

## 2014-12-25 DIAGNOSIS — R2681 Unsteadiness on feet: Secondary | ICD-10-CM | POA: Diagnosis not present

## 2014-12-25 DIAGNOSIS — H8110 Benign paroxysmal vertigo, unspecified ear: Secondary | ICD-10-CM | POA: Diagnosis not present

## 2015-01-01 ENCOUNTER — Encounter: Payer: Self-pay | Admitting: *Deleted

## 2015-01-06 DIAGNOSIS — L851 Acquired keratosis [keratoderma] palmaris et plantaris: Secondary | ICD-10-CM | POA: Diagnosis not present

## 2015-01-06 DIAGNOSIS — E1142 Type 2 diabetes mellitus with diabetic polyneuropathy: Secondary | ICD-10-CM | POA: Diagnosis not present

## 2015-01-06 DIAGNOSIS — M65332 Trigger finger, left middle finger: Secondary | ICD-10-CM | POA: Diagnosis not present

## 2015-01-06 DIAGNOSIS — B351 Tinea unguium: Secondary | ICD-10-CM | POA: Diagnosis not present

## 2015-01-12 ENCOUNTER — Other Ambulatory Visit: Payer: Self-pay | Admitting: Internal Medicine

## 2015-01-13 ENCOUNTER — Ambulatory Visit: Payer: Medicare Other | Admitting: Occupational Therapy

## 2015-01-19 ENCOUNTER — Ambulatory Visit: Payer: Medicare Other | Attending: Orthopedic Surgery | Admitting: Occupational Therapy

## 2015-01-19 DIAGNOSIS — M79642 Pain in left hand: Secondary | ICD-10-CM | POA: Diagnosis not present

## 2015-01-19 DIAGNOSIS — M25642 Stiffness of left hand, not elsewhere classified: Secondary | ICD-10-CM

## 2015-01-19 NOTE — Patient Instructions (Signed)
Pt to wear MC block splint on 3rd digit at night time and during gripping or tight grip on objects  Pt to built up handles to avoid tight grip   Do ice massage over A1 pulley of 3rd

## 2015-01-19 NOTE — Therapy (Signed)
[place] [facility] [address] [place], [address] Phone: [phone]   Fax:  [phone]  Occupational Therapy Treatment  Patient Details  Name: Michael Colon MRN: [id number] Date of Birth: September 19, 1930 Referring Provider: Marry Guan  Encounter Date: 01/19/2015      OT End of Session - 01/19/15 1739    Visit Number 1   Number of Visits 6   Date for OT Re-Evaluation 02/09/15   OT Start Time 1028   OT Stop Time 1129   OT Time Calculation (min) 61 min   Activity Tolerance Patient tolerated treatment well   Behavior During Therapy [facility] for tasks assessed/performed      Past Medical History  Diagnosis Date  . Chronic prostatitis   . Obstructive sleep apnea     CPAP-9  . Chronic ear infection   . Asthma   . GERD (gastroesophageal reflux disease)     laryngeal involvement  . Hypertension   . Osteoarthrosis, localized, primary, knee     post-traumatic  . Diabetes mellitus   . Tachy-brady syndrome ([place])   . Atrial flutter ([place]) 2007  . Benign prostatic hypertrophy   . Hyperlipidemia   . Band keratopathy   . UTI (urinary tract infection)   . Prostatitis   . Chronic kidney disease   . Stroke [facility])     TIA    Past Surgical History  Procedure Laterality Date  . Mastoidectomy  8/08    Dr Idelle Crouch  . Rhinoplasty  5/10    and septoplasty  . Cataract extraction, bilateral  2009  . Belpharoptosis repair      Dr [name]---didn't resolve weepy eye and eyelid drooping  . Knee surgery  1998    plate after fracture, then removed for infection  . Cardioversion  04/13/2010  . Chest pain  8/12    Stress test benign  . Appendectomy    . Left elbow surgery    . Venous ablation    . Tear duct probing  11/13    Dr Vickki Muff  . Subacromial decompression Right 2005    Arthroscopic (for rotator cuff and biceps tendon ruptures)  . Eye surgery    . Fracture surgery Left     elbow  . Total knee arthroplasty Left  09/01/2014    Procedure: TOTAL KNEE ARTHROPLASTY;  Surgeon: Dereck Leep, MD;  Location: [facility] ORS;  Service: Orthopedics;  Laterality: Left;    There were no vitals filed for this visit.  Visit Diagnosis:  Pain of left hand - Plan: Ot plan of care cert/re-cert  Stiffness of finger joint of left hand - Plan: Ot plan of care cert/re-cert      Subjective Assessment - 01/19/15 1729    Subjective  Trigger finger started about just over 6 months ago - and mostly in am and when gripping or using it - did not  take shot wanted to try therapy - I am R hand dominant   Patient Stated Goals That my L middle finger can stop triggering - lock in the am or when using it - that  I can grip objects    Currently in Pain? Yes   Pain Score 2    Pain Location Finger (Comment which one)   Pain Orientation Left   Pain Descriptors / Indicators Aching   Pain Onset More than a month ago            [facility] OT Assessment - 01/19/15 0001  Assessment   Diagnosis L 3rd digit trigger finger    Referring Provider [place]   Onset Date 07/04/14   Assessment Trigger finger started about 6 months ago - limiting his gripping of objects and in the am when getting up - some tenderness over A1pulley   Balance Screen   Has the patient fallen in the past 6 months No   Has the patient had a decrease in activity level because of a fear of falling?  No   Is the patient reluctant to leave their home because of a fear of falling?  No   Home  Environment   Lives With Spouse   Prior Function   Level of Independence Independent   Leisure Pt lives at [facility], , R hand dominant - very acitve working around the campus fixing things, yard work , on Teaching laboratory technician, Scientist, water quality, more than 4 hrs on computer -                           OT Education - 01/26/15 1739    Education provided Yes   Education Details HEP , and activities modifications    Person(s) Educated Patient   Methods  Explanation;Demonstration;Tactile cues;Verbal cues   Comprehension Verbalized understanding;Returned demonstration;Verbal cues required          OT Short Term Goals - 01-26-15 1743    OT SHORT TERM GOAL #1   Title Pt report decrease pain at the worse less than 5/10   Baseline Pain at the worse 6/10   Time 2   Period Weeks   Status New   OT SHORT TERM GOAL #2   Title Pt to be ind in HEP to modify actiivities that aggrivate and increase trigger finger    Baseline very little knowledge   Time 1   Period Weeks   Status New           OT Long Term Goals - 2015-01-26 1744    OT LONG TERM GOAL #1   Title Pt report decrease triggering of L 3rd digit by at least 50%   Baseline Several times during day and in am    Time 4   Period Weeks   Status New               Plan - 2015-01-26 1740    Clinical Impression Statement Pt show trigger of 3rd digit in R hand in am and when gripping objects several times a day - pt was ed on modifications of task and wearing of MC block ring splint with composite fist activites and night time - ionto with dexamethason recommended  -  will assess if symptoms improve in 4-5 sessions    Pt will benefit from skilled therapeutic intervention in order to improve on the following deficits (Retired) Impaired flexibility;Decreased range of motion;Pain;Impaired UE functional use   Rehab Potential Fair   OT Frequency 2x / week   OT Duration 4 weeks   OT Treatment/Interventions Splinting;Patient/family education;Therapeutic exercises;Manual Therapy;Cryotherapy;Iontophoresis   Plan Assess use of splint and if able to modify activities   OT Home Exercise Plan see pt instruction   Consulted and Agree with Plan of Care Patient          G-Codes - 01/26/15 1746    Functional Assessment Tool Used PRWHE, ROM , grip strength, ROM , pain , clinical judgement   Functional Limitation Self care   Self Care Current Status [id number]) At least 20 percent  but less than 40  percent impaired, limited or restricted   Self Care Goal Status [id number]) At least 1 percent but less than 20 percent impaired, limited or restricted      Problem List Patient Active Problem List   Diagnosis Date Noted  . BPPV (benign paroxysmal positional vertigo) 12/04/2014  . Advanced directives, counseling/discussion 12/13/2013  . Encounter for therapeutic drug monitoring 01/30/2013  . Routine general medical examination at a health care facility 12/11/2012  . TIA (transient ischemic attack) 12/11/2012  . Ventricular tachycardia-pause dependent 10/16/2012  . Sinus bradycardia 09/07/2012  . Lower extremity edema 09/07/2012  . Hyperlipidemia   . Abnormal EKG 09/26/2011  . Atrial flutter ([place]) 07/21/2010  . Encounter for anticoagulation discussion and counseling 07/21/2010  . Diabetes mellitus type II, controlled, with no complications ([place])   . Tachy-brady syndrome ([place])   . BPH (benign prostatic hypertrophy)   . Unspecified asthma(493.90)   . GERD (gastroesophageal reflux disease)   . Hypertension     Rosalyn Gess OTR/L,CLT 01/19/2015, 5:50 PM  [place] [facility] [address] [place], [address] Phone: [phone]   Fax:  [phone]  Name: Michael Colon MRN: [id number] Date of Birth: 1930-02-19

## 2015-01-22 ENCOUNTER — Ambulatory Visit: Payer: Medicare Other | Admitting: Occupational Therapy

## 2015-01-22 DIAGNOSIS — M25642 Stiffness of left hand, not elsewhere classified: Secondary | ICD-10-CM

## 2015-01-22 DIAGNOSIS — M79642 Pain in left hand: Secondary | ICD-10-CM | POA: Diagnosis not present

## 2015-01-22 NOTE — Patient Instructions (Signed)
Same - tendon glides - but composite fist should not trigger - and not tight fist   Only wear splint for sleeping and during composite fist activities

## 2015-01-22 NOTE — Therapy (Signed)
[place] [facility] [address], [place], [address] Phone: [phone]   Fax:  [phone]  Occupational Therapy Treatment  Patient Details  Name: Michael Colon MRN: [id number] Date of Birth: [birth year] Referring Provider: Marry Guan  Encounter Date: 01/22/2015      OT End of Session - 01/22/15 1400    Visit Number 2   Number of Visits 6   Date for OT Re-Evaluation 02/09/15   OT Start Time 1148   OT Stop Time 1216   OT Time Calculation (min) 28 min   Activity Tolerance Patient tolerated treatment well   Behavior During Therapy [facility] for tasks assessed/performed      Past Medical History  Diagnosis Date  . Chronic prostatitis   . Obstructive sleep apnea     CPAP-9  . Chronic ear infection   . Asthma   . GERD (gastroesophageal reflux disease)     laryngeal involvement  . Hypertension   . Osteoarthrosis, localized, primary, knee     post-traumatic  . Diabetes mellitus   . Tachy-brady syndrome ([place])   . Atrial flutter ([place]) 2007  . Benign prostatic hypertrophy   . Hyperlipidemia   . Band keratopathy   . UTI (urinary tract infection)   . Prostatitis   . Chronic kidney disease   . Stroke [facility])     TIA    Past Surgical History  Procedure Laterality Date  . Mastoidectomy  8/08    Dr Idelle Crouch  . Rhinoplasty  5/10    and septoplasty  . Cataract extraction, bilateral  2009  . Belpharoptosis repair      Dr [name]---didn't resolve weepy eye and eyelid drooping  . Knee surgery  1998    plate after fracture, then removed for infection  . Cardioversion  04/13/2010  . Chest pain  8/12    Stress test benign  . Appendectomy    . Left elbow surgery    . Venous ablation    . Tear duct probing  11/13    Dr Vickki Muff  . Subacromial decompression Right 2005    Arthroscopic (for rotator cuff and biceps tendon ruptures)  . Eye surgery    . Fracture surgery Left     elbow  . Total knee arthroplasty Left  09/01/2014    Procedure: TOTAL KNEE ARTHROPLASTY;  Surgeon: Dereck Leep, MD;  Location: [facility] ORS;  Service: Orthopedics;  Laterality: Left;    There were no vitals filed for this visit.  Visit Diagnosis:  Pain of left hand  Stiffness of finger joint of left hand      Subjective Assessment - 01/22/15 1214    Subjective  I wore the splint at night time and mostly during day  -  I grip always sometthing so I just kept it on the splint    Patient Stated Goals That my L middle finger can stop triggering - lock in the am or when using it - that  I can grip objects    Currently in Pain? Yes   Pain Score 4    Pain Location Finger (Comment which one)   Pain Orientation Left                      OT Treatments/Exercises (OP) - 01/22/15 0001    Hand Exercises   Other Hand Exercises Tendongliding - seperate steps - pt triggering with composite fist    Iontophoresis   Type of Iontophoresis Dexamethasone  Location over 3rd digit on L hand A1 pulley    Dose med patch -at 2.0 current    Time 19   Manual Therapy   Manual therapy comments tenderness about the same than last viist over A1 pulley     Pt ed on what to expect during ionto - and then to keep it on for about hour after session  SKin check done prior and end of last session  No issues           OT Education - 01/22/15 1400    Education provided Yes   Education Details HEP and act modificaitons    Person(s) Educated Patient   Methods Explanation;Demonstration;Tactile cues   Comprehension Verbalized understanding;Returned demonstration          OT Short Term Goals - 01/19/15 1743    OT SHORT TERM GOAL #1   Title Pt report decrease pain at the worse less than 5/10   Baseline Pain at the worse 6/10   Time 2   Period Weeks   Status New   OT SHORT TERM GOAL #2   Title Pt to be ind in HEP to modify actiivities that aggrivate and increase trigger finger    Baseline very little knowledge   Time 1    Period Weeks   Status New           OT Long Term Goals - 01/19/15 1744    OT LONG TERM GOAL #1   Title Pt report decrease triggering of L 3rd digit by at least 50%   Baseline Several times during day and in am    Time 4   Period Weeks   Status New               Plan - 01/22/15 1401    Clinical Impression Statement Pt  report he was wearing MC block splint all the time - not really pain since last time - but still trigger this date in clinic with composite fist - this was 2nd session of ionto - will cont  with splint only during composite fist act  and sleeping    Pt will benefit from skilled therapeutic intervention in order to improve on the following deficits (Retired) Impaired flexibility;Decreased range of motion;Pain;Impaired UE functional use   OT Frequency 2x / week   OT Duration 2 weeks   OT Treatment/Interventions Splinting;Patient/family education;Therapeutic exercises;Manual Therapy;Cryotherapy;Iontophoresis   Plan assess tenderness and triggering    OT Home Exercise Plan see pt instruction   Consulted and Agree with Plan of Care Patient        Problem List Patient Active Problem List   Diagnosis Date Noted  . BPPV (benign paroxysmal positional vertigo) 12/04/2014  . Advanced directives, counseling/discussion 12/13/2013  . Encounter for therapeutic drug monitoring 01/30/2013  . Routine general medical examination at a health care facility 12/11/2012  . TIA (transient ischemic attack) 12/11/2012  . Ventricular tachycardia-pause dependent 10/16/2012  . Sinus bradycardia 09/07/2012  . Lower extremity edema 09/07/2012  . Hyperlipidemia   . Abnormal EKG 09/26/2011  . Atrial flutter ([place]) 07/21/2010  . Encounter for anticoagulation discussion and counseling 07/21/2010  . Diabetes mellitus type II, controlled, with no complications ([place])   . Tachy-brady syndrome ([place])   . BPH (benign prostatic hypertrophy)   . Unspecified asthma(493.90)   . GERD  (gastroesophageal reflux disease)   . Hypertension     Yariel Ferraris OTR/Ll,CLT 01/22/2015, 2:03 PM  [place] [facility] [address], [place], [address] Phone: [phone]   Fax:  [phone]  Name: Michael Colon MRN: [id number] Date of Birth: [birth year]

## 2015-01-28 ENCOUNTER — Ambulatory Visit: Payer: Medicare Other | Admitting: Occupational Therapy

## 2015-01-28 ENCOUNTER — Ambulatory Visit (INDEPENDENT_AMBULATORY_CARE_PROVIDER_SITE_OTHER): Payer: Medicare Other

## 2015-01-28 DIAGNOSIS — M79642 Pain in left hand: Secondary | ICD-10-CM

## 2015-01-28 DIAGNOSIS — Z7901 Long term (current) use of anticoagulants: Secondary | ICD-10-CM | POA: Diagnosis not present

## 2015-01-28 DIAGNOSIS — I4892 Unspecified atrial flutter: Secondary | ICD-10-CM | POA: Diagnosis not present

## 2015-01-28 DIAGNOSIS — Z5181 Encounter for therapeutic drug level monitoring: Secondary | ICD-10-CM

## 2015-01-28 DIAGNOSIS — M25642 Stiffness of left hand, not elsewhere classified: Secondary | ICD-10-CM | POA: Diagnosis not present

## 2015-01-28 LAB — POCT INR: INR: 2.2

## 2015-01-28 NOTE — Patient Instructions (Signed)
Same as before - add digits extention off table  - tapping  2-3 x day 10 reps

## 2015-01-28 NOTE — Therapy (Signed)
[facility] [address], [place], [address] Phone: [phone]   Fax:  [phone]  Occupational Therapy Treatment  Patient Details  Name: Michael Colon MRN: [id number] Date of Birth: 02-May-1930 Referring Provider: Marry Guan  Encounter Date: 01/28/2015      OT End of Session - 01/28/15 1359    Visit Number 3   Number of Visits 6   Date for OT Re-Evaluation 02/09/15   OT Start Time 1316   OT Stop Time 1358   OT Time Calculation (min) 42 min   Activity Tolerance Patient tolerated treatment well   Behavior During Therapy [facility] for tasks assessed/performed      Past Medical History  Diagnosis Date  . Chronic prostatitis   . Obstructive sleep apnea     CPAP-9  . Chronic ear infection   . Asthma   . GERD (gastroesophageal reflux disease)     laryngeal involvement  . Hypertension   . Osteoarthrosis, localized, primary, knee     post-traumatic  . Diabetes mellitus   . Tachy-brady syndrome ([place])   . Atrial flutter ([place]) 2007  . Benign prostatic hypertrophy   . Hyperlipidemia   . Band keratopathy   . UTI (urinary tract infection)   . Prostatitis   . Chronic kidney disease   . Stroke [facility])     TIA    Past Surgical History  Procedure Laterality Date  . Mastoidectomy  8/08    Dr Idelle Crouch  . Rhinoplasty  5/10    and septoplasty  . Cataract extraction, bilateral  2009  . Belpharoptosis repair      Dr [name]---didn't resolve weepy eye and eyelid drooping  . Knee surgery  1998    plate after fracture, then removed for infection  . Cardioversion  04/13/2010  . Chest pain  8/12    Stress test benign  . Appendectomy    . Left elbow surgery    . Venous ablation    . Tear duct probing  11/13    Dr Vickki Muff  . Subacromial decompression Right 2005    Arthroscopic (for rotator cuff and biceps tendon ruptures)  . Eye surgery    . Fracture surgery Left     elbow  . Total knee arthroplasty Left  09/01/2014    Procedure: TOTAL KNEE ARTHROPLASTY;  Surgeon: Dereck Leep, MD;  Location: [facility] ORS;  Service: Orthopedics;  Laterality: Left;    There were no vitals filed for this visit.  Visit Diagnosis:  Pain of left hand  Stiffness of finger joint of left hand      Subjective Assessment - 01/28/15 1334    Subjective  My finger still locking during day - more than 5 times - and it feels there is more pain with it when it locks   Patient Stated Goals That my L middle finger can stop triggering - lock in the am or when using it - that  I can grip objects    Currently in Pain? Yes   Pain Score 6    Pain Location Finger (Comment which one)   Pain Orientation Left   Pain Descriptors / Indicators Aching   Pain Onset More than a month ago                      OT Treatments/Exercises (OP) - 01/28/15 0001    Hand Exercises   Other Hand Exercises tendon glides but try not to have  full fist trigger - add  digits extention - tapping one fingers and all 5 digits    Iontophoresis   Type of Iontophoresis Dexamethasone   Location over 3rd digit on L hand A1 pulley    Dose med patch -at 2.0 current    Time 18   Manual Therapy   Manual therapy comments tenderness little better per pt , but pain maybe more when locking                 OT Education - 01/28/15 1358    Education provided Yes   Education Details HEP   Person(s) Educated Patient   Methods Explanation;Demonstration   Comprehension Returned demonstration;Verbalized understanding          OT Short Term Goals - 01/19/15 1743    OT SHORT TERM GOAL #1   Title Pt report decrease pain at the worse less than 5/10   Baseline Pain at the worse 6/10   Time 2   Period Weeks   Status New   OT SHORT TERM GOAL #2   Title Pt to be ind in HEP to modify actiivities that aggrivate and increase trigger finger    Baseline very little knowledge   Time 1   Period Weeks   Status New           OT Long Term  Goals - 01/19/15 1744    OT LONG TERM GOAL #1   Title Pt report decrease triggering of L 3rd digit by at least 50%   Baseline Several times during day and in am    Time 4   Period Weeks   Status New               Plan - 01/28/15 1359    Clinical Impression Statement Pt cont to show triggering of 3rd digit in L hand - less tenderness per pt  but maybe more pain when triggering - cont with splint, modifications of task - will have 4th session of ionto on Friday    Pt will benefit from skilled therapeutic intervention in order to improve on the following deficits (Retired) Impaired flexibility;Decreased range of motion;Pain;Impaired UE functional use   Rehab Potential Fair   OT Frequency 2x / week   OT Duration 2 weeks   OT Treatment/Interventions Splinting;Patient/family education;Therapeutic exercises;Manual Therapy;Cryotherapy;Iontophoresis   Plan assess pain and triggering   OT Home Exercise Plan see pt instruction   Consulted and Agree with Plan of Care Patient        Problem List Patient Active Problem List   Diagnosis Date Noted  . BPPV (benign paroxysmal positional vertigo) 12/04/2014  . Advanced directives, counseling/discussion 12/13/2013  . Encounter for therapeutic drug monitoring 01/30/2013  . Routine general medical examination at a health care facility 12/11/2012  . TIA (transient ischemic attack) 12/11/2012  . Ventricular tachycardia-pause dependent 10/16/2012  . Sinus bradycardia 09/07/2012  . Lower extremity edema 09/07/2012  . Hyperlipidemia   . Abnormal EKG 09/26/2011  . Atrial flutter ([place]) 07/21/2010  . Encounter for anticoagulation discussion and counseling 07/21/2010  . Diabetes mellitus type II, controlled, with no complications ([place])   . Tachy-brady syndrome ([place])   . BPH (benign prostatic hypertrophy)   . Unspecified asthma(493.90)   . GERD (gastroesophageal reflux disease)   . Hypertension     Shawntel Farnworth OTR/L, CLT 01/28/2015, 2:02  PM  [facility] [address] [place], [address] Phone: [phone]  Fax:  [phone]  Name: Michael Colon MRN: [id number] Date of Birth: [birth year]

## 2015-01-30 ENCOUNTER — Ambulatory Visit: Payer: Medicare Other | Admitting: Occupational Therapy

## 2015-01-30 DIAGNOSIS — M79642 Pain in left hand: Secondary | ICD-10-CM

## 2015-01-30 DIAGNOSIS — M25642 Stiffness of left hand, not elsewhere classified: Secondary | ICD-10-CM

## 2015-01-30 NOTE — Therapy (Signed)
[name] [address], [place], [address] Phone: [phone]   Fax:  [phone]  Occupational Therapy Treatment  Patient Details  Name: Michael Colon MRN: [id number] Date of Birth: 1930-10-01 Referring Provider: Marry Colon  Encounter Date: 01/30/2015      OT End of Session - 01/30/15 1447    Visit Number 4   Number of Visits 6   Date for OT Re-Evaluation 02/09/15   OT Start Time [id number]   OT Stop Time 1329   OT Time Calculation (min) 24 min   Activity Tolerance Patient tolerated treatment well   Behavior During Therapy [facility] for tasks assessed/performed      Past Medical History  Diagnosis Date  . Chronic prostatitis   . Obstructive sleep apnea     CPAP-9  . Chronic ear infection   . Asthma   . GERD (gastroesophageal reflux disease)     laryngeal involvement  . Hypertension   . Osteoarthrosis, localized, primary, knee     post-traumatic  . Diabetes mellitus   . Tachy-brady syndrome ([place])   . Atrial flutter ([place]) 2007  . Benign prostatic hypertrophy   . Hyperlipidemia   . Band keratopathy   . UTI (urinary tract infection)   . Prostatitis   . Chronic kidney disease   . Stroke [facility])     TIA    Past Surgical History  Procedure Laterality Date  . Mastoidectomy  8/08    Dr Idelle Crouch  . Rhinoplasty  5/10    and septoplasty  . Cataract extraction, bilateral  2009  . Belpharoptosis repair      Dr [name]---didn't resolve weepy eye and eyelid drooping  . Knee surgery  1998    plate after fracture, then removed for infection  . Cardioversion  04/13/2010  . Chest pain  8/12    Stress test benign  . Appendectomy    . Left elbow surgery    . Venous ablation    . Tear duct probing  11/13    Dr Vickki Muff  . Subacromial decompression Right 2005    Arthroscopic (for rotator cuff and biceps tendon ruptures)  . Eye surgery    . Fracture surgery Left     elbow  . Total knee arthroplasty Left  09/01/2014    Procedure: TOTAL KNEE ARTHROPLASTY;  Surgeon: Dereck Leep, MD;  Location: [facility] ORS;  Service: Orthopedics;  Laterality: Left;    There were no vitals filed for this visit.  Visit Diagnosis:  Pain of left hand  Stiffness of finger joint of left hand      Subjective Assessment - 01/30/15 1440    Subjective  It still triggers several times during day - but if I have to draw you picture - it will  look like that knot on tenden corners are not as sharp as before - think it is smaller - pain not so intense and also not as tender    Patient Stated Goals That my L middle finger can stop triggering - lock in the am or when using it - that  I can grip objects    Currently in Pain? No/denies                      OT Treatments/Exercises (OP) - 01/30/15 0001    Iontophoresis   Type of Iontophoresis Dexamethasone   Location over 3rd digit on L hand A1 pulley    Dose med patch -  at 2.0 current    Time 18   Splinting   Splinting Pt to switch to prefab [name] block splint for 3rd  to avoid composite fist      Some irritation at patch on forearm - positive electrode - will move next time  Pt to wash arm  When getting home  Pt report medicine patch fall off when he leaves in about 5-10 min            OT Education - 01/30/15 1447    Education provided Yes   Education Details splint use , avoid composite fist   Person(s) Educated Patient   Methods Explanation;Demonstration   Comprehension Verbalized understanding;Returned demonstration          OT Short Term Goals - 01/19/15 1743    OT SHORT TERM GOAL #1   Title Pt report decrease pain at the worse less than 5/10   Baseline Pain at the worse 6/10   Time 2   Period Weeks   Status New   OT SHORT TERM GOAL #2   Title Pt to be ind in HEP to modify actiivities that aggrivate and increase trigger finger    Baseline very little knowledge   Time 1   Period Weeks   Status New           OT Long Term  Goals - 01/19/15 1744    OT LONG TERM GOAL #1   Title Pt report decrease triggering of L 3rd digit by at least 50%   Baseline Several times during day and in am    Time 4   Period Weeks   Status New               Plan - 01/30/15 1447    Clinical Impression Statement Pt report still the same amount of triggering but maybe not as intense and also not as tender - pt to return Monday prior to MD appt - this was pt's 4th session    Pt will benefit from skilled therapeutic intervention in order to improve on the following deficits (Retired) Impaired flexibility;Decreased range of motion;Pain;Impaired UE functional use   Rehab Potential Fair   OT Frequency 2x / week   OT Duration Other (comment)   OT Treatment/Interventions Splinting;Patient/family education;Therapeutic exercises;Manual Therapy;Cryotherapy;Iontophoresis   Plan assess pain and triggering   OT Home Exercise Plan see pt instruction   Consulted and Agree with Plan of Care Patient        Problem List Patient Active Problem List   Diagnosis Date Noted  . BPPV (benign paroxysmal positional vertigo) 12/04/2014  . Advanced directives, counseling/discussion 12/13/2013  . Encounter for therapeutic drug monitoring 01/30/2013  . Routine general medical examination at a health care facility 12/11/2012  . TIA (transient ischemic attack) 12/11/2012  . Ventricular tachycardia-pause dependent 10/16/2012  . Sinus bradycardia 09/07/2012  . Lower extremity edema 09/07/2012  . Hyperlipidemia   . Abnormal EKG 09/26/2011  . Atrial flutter ([place]) 07/21/2010  . Encounter for anticoagulation discussion and counseling 07/21/2010  . Diabetes mellitus type II, controlled, with no complications ([place])   . Tachy-brady syndrome ([place])   . BPH (benign prostatic hypertrophy)   . Unspecified asthma(493.90)   . GERD (gastroesophageal reflux disease)   . Hypertension     Rosalyn Gess OTR/L,CLT 01/30/2015, 2:49 PM  [facility] [address] [place], [address] Phone: [phone]   Fax:  [phone]  Name: Michael Colon  MRN: [id number] Date of Birth: 1930-11-14

## 2015-01-30 NOTE — Patient Instructions (Signed)
Cont with same

## 2015-02-02 ENCOUNTER — Ambulatory Visit: Payer: Medicare Other | Admitting: Occupational Therapy

## 2015-02-02 DIAGNOSIS — M25642 Stiffness of left hand, not elsewhere classified: Secondary | ICD-10-CM

## 2015-02-02 DIAGNOSIS — M79642 Pain in left hand: Secondary | ICD-10-CM | POA: Diagnosis not present

## 2015-02-02 NOTE — Patient Instructions (Signed)
Same as before - seeing MD tomorrow

## 2015-02-02 NOTE — Therapy (Signed)
[facility] [address] [place], [address] Phone: [phone]   Fax:  [phone]  Occupational Therapy Treatment  Patient Details  Name: Michael Colon MRN: [id number] Date of Birth: November 30, 1930 Referring Provider: Marry Guan  Encounter Date: 02/02/2015      OT End of Session - 02/02/15 1159    Visit Number 5   Number of Visits 5   Date for OT Re-Evaluation 02/02/15   OT Start Time 1002   OT Stop Time 1018   OT Time Calculation (min) 16 min   Activity Tolerance Patient tolerated treatment well   Behavior During Therapy [facility] for tasks assessed/performed      Past Medical History  Diagnosis Date  . Chronic prostatitis   . Obstructive sleep apnea     CPAP-9  . Chronic ear infection   . Asthma   . GERD (gastroesophageal reflux disease)     laryngeal involvement  . Hypertension   . Osteoarthrosis, localized, primary, knee     post-traumatic  . Diabetes mellitus   . Tachy-brady syndrome ([place])   . Atrial flutter ([place]) 2007  . Benign prostatic hypertrophy   . Hyperlipidemia   . Band keratopathy   . UTI (urinary tract infection)   . Prostatitis   . Chronic kidney disease   . Stroke [facility])     TIA    Past Surgical History  Procedure Laterality Date  . Mastoidectomy  8/08    Dr Idelle Crouch  . Rhinoplasty  5/10    and septoplasty  . Cataract extraction, bilateral  2009  . Belpharoptosis repair      Dr [name]---didn't resolve weepy eye and eyelid drooping  . Knee surgery  1998    plate after fracture, then removed for infection  . Cardioversion  04/13/2010  . Chest pain  8/12    Stress test benign  . Appendectomy    . Left elbow surgery    . Venous ablation    . Tear duct probing  11/13    Dr Vickki Muff  . Subacromial decompression Right 2005    Arthroscopic (for rotator cuff and biceps tendon ruptures)  . Eye surgery    . Fracture surgery Left     elbow  . Total knee arthroplasty Left  09/01/2014    Procedure: TOTAL KNEE ARTHROPLASTY;  Surgeon: Dereck Leep, MD;  Location: [facility] ORS;  Service: Orthopedics;  Laterality: Left;    There were no vitals filed for this visit.  Visit Diagnosis:  Pain of left hand  Stiffness of finger joint of left hand      Subjective Assessment - 02/02/15 1153    Subjective  It is not as tender and not as intense pain when locking - but it still locks during the day when gripping or making fist - I am wearing the splint like you told me - I am seeing Dr Marry Guan tomorrow    Patient Stated Goals That my L middle finger can stop triggering - lock in the am or when using it - that  I can grip objects    Currently in Pain? No/denies            [facility] OT Assessment - 02/02/15 0001    Strength   Right Hand Grip (lbs) 51   Right Hand 3 Point Pinch 15 lbs   Left Hand Grip (lbs) 32   Left Hand 3 Point Pinch 12 lbs   Left Hand AROM  L Long  MCP 0-90 90 Degrees   L Long PIP 0-100 90 Degrees                  OT Treatments/Exercises (OP) - 02/02/15 0001    Bed Mobility   Bed Mobility Sitting - Scoot to Edge of Bed   Hand Exercises   Other Hand Exercises measurements taken for ROM , grip - see flowsheet- tendon glides but try not to have full fist trigger - add  digits extention - tapping one fingers and all 5 digits    Other Hand Exercises Tightness with 3rd and 4th extention   Manual Therapy   Manual therapy comments tenderness better but maybe not as intense when locking                 OT Education - 02/02/15 1159    Education provided Yes   Education Details Appear therapy not working - locking still same amount of time during day - discuss with Dr Marry Guan further options    Person(s) Educated Patient   Methods Explanation          OT Short Term Goals - 02/02/15 1202    OT SHORT TERM GOAL #1   Title Pt report decrease pain at the worse less than 5/10   Baseline pain decrease - except when locking    Status  Partially Met   OT SHORT TERM GOAL #2   Title Pt to be ind in HEP to modify actiivities that aggrivate and increase trigger finger    Baseline did change but still triggering   Status Partially Met           OT Long Term Goals - 02/02/15 1203    OT LONG TERM GOAL #1   Title Pt report decrease triggering of L 3rd digit by at least 50%   Baseline Several times during day and in am    Status Not Met               Plan - 02/02/15 1201    Clinical Impression Statement Pt had 4 sessions of ionto , and splint wearing for MC - pain , tenderness improve but same amount of triggering during day - not only with tight fist but with functional gripping or partial gripping - pt to see Dr Marry Guan tomorrow to discuss other options    Plan appt with MD tomorrow   Consulted and Agree with Plan of Care Patient        Problem List Patient Active Problem List   Diagnosis Date Noted  . BPPV (benign paroxysmal positional vertigo) 12/04/2014  . Advanced directives, counseling/discussion 12/13/2013  . Encounter for therapeutic drug monitoring 01/30/2013  . Routine general medical examination at a health care facility 12/11/2012  . TIA (transient ischemic attack) 12/11/2012  . Ventricular tachycardia-pause dependent 10/16/2012  . Sinus bradycardia 09/07/2012  . Lower extremity edema 09/07/2012  . Hyperlipidemia   . Abnormal EKG 09/26/2011  . Atrial flutter ([place]) 07/21/2010  . Encounter for anticoagulation discussion and counseling 07/21/2010  . Diabetes mellitus type II, controlled, with no complications ([place])   . Tachy-brady syndrome ([place])   . BPH (benign prostatic hypertrophy)   . Unspecified asthma(493.90)   . GERD (gastroesophageal reflux disease)   . Hypertension     Michael Colon OTR/L,CLT 02/02/2015, 12:04 PM  [facility] [facility] [address], [place], [address] Phone: [phone]   Fax:   [phone]  Name: Michael Colon MRN: [id number] Date of Birth: 25-Jun-1930

## 2015-02-03 DIAGNOSIS — M65332 Trigger finger, left middle finger: Secondary | ICD-10-CM | POA: Diagnosis not present

## 2015-02-17 ENCOUNTER — Encounter
Admission: RE | Admit: 2015-02-17 | Discharge: 2015-02-17 | Disposition: A | Discharge status: Home / Self Care | Payer: Medicare Other | Source: Ambulatory Visit | Attending: Orthopedic Surgery | Admitting: Orthopedic Surgery

## 2015-02-17 DIAGNOSIS — Z029 Encounter for administrative examinations, unspecified: Secondary | ICD-10-CM | POA: Insufficient documentation

## 2015-02-17 NOTE — Patient Instructions (Signed)
  Your procedure is scheduled on: 02/25/15 Report to Day Surgery. To find out your arrival time please call [phone] between 1PM - 3PM on 02/24/15.  Remember: Instructions that are not followed completely may result in serious medical risk, up to and including death, or upon the discretion of your surgeon and anesthesiologist your surgery may need to be rescheduled.    __x__ 1. Do not eat food or drink liquids after midnight. No gum chewing or hard candies.     __x__ 2. No Alcohol for 24 hours before or after surgery.   ____ 3. Bring all medications with you on the day of surgery if instructed.    ___x_ 4. Notify your doctor if there is any change in your medical condition     (cold, fever, infections).     Do not wear jewelry, make-up, hairpins, clips or nail polish.  Do not wear lotions, powders, or perfumes. You may wear deodorant.  Do not shave 48 hours prior to surgery. Men may shave face and neck.  Do not bring valuables to the hospital.    [facility] is not responsible for any belongings or valuables.               Contacts, dentures or bridgework may not be worn into surgery.  Leave your suitcase in the car. After surgery it may be brought to your room.  For patients admitted to the hospital, discharge time is determined by your                treatment team.   Patients discharged the day of surgery will not be allowed to drive home.   Please read over the following fact sheets that you were given:   Surgical Site Infection Prevention   ____ Take these medicines the morning of surgery with A SIP OF WATER:    1. pepcid at night and am of surgery  2. Bring you diazide with you the am of surgery  3.   4.  5.  6.  ____ Fleet Enema (as directed)   _x___ Use CHG Soap as directed  ____ Use inhalers on the day of surgery  __x__ Stop metformin 2 days prior to surgery    ____ Take 1/2 of usual insulin dose the night before surgery and none on the morning of surgery.    __x__ Stop Coumadin last dose on 2/16  ___x_ Stop Anti-inflammatories on 2/15 may take tylenol  __x__ Stop supplements until after surgery.  2/15  ___x_ Bring C-Pap to the hospital.

## 2015-02-25 ENCOUNTER — Ambulatory Visit
Admission: RE | Admit: 2015-02-25 | Discharge: 2015-02-25 | Disposition: A | Discharge status: Home / Self Care | Payer: Medicare Other | Source: Ambulatory Visit | Attending: Orthopedic Surgery | Admitting: Orthopedic Surgery

## 2015-02-25 ENCOUNTER — Ambulatory Visit: Payer: Medicare Other | Admitting: Certified Registered Nurse Anesthetist

## 2015-02-25 ENCOUNTER — Encounter
Admission: RE | Disposition: A | Discharge status: Home / Self Care | Payer: Self-pay | Source: Ambulatory Visit | Attending: Orthopedic Surgery

## 2015-02-25 ENCOUNTER — Encounter: Payer: Self-pay | Admitting: *Deleted

## 2015-02-25 DIAGNOSIS — Z800 Family history of malignant neoplasm of digestive organs: Secondary | ICD-10-CM | POA: Insufficient documentation

## 2015-02-25 DIAGNOSIS — N400 Enlarged prostate without lower urinary tract symptoms: Secondary | ICD-10-CM | POA: Diagnosis not present

## 2015-02-25 DIAGNOSIS — Z91048 Other nonmedicinal substance allergy status: Secondary | ICD-10-CM | POA: Insufficient documentation

## 2015-02-25 DIAGNOSIS — Z8673 Personal history of transient ischemic attack (TIA), and cerebral infarction without residual deficits: Secondary | ICD-10-CM | POA: Insufficient documentation

## 2015-02-25 DIAGNOSIS — Z809 Family history of malignant neoplasm, unspecified: Secondary | ICD-10-CM | POA: Insufficient documentation

## 2015-02-25 DIAGNOSIS — J45909 Unspecified asthma, uncomplicated: Secondary | ICD-10-CM | POA: Diagnosis not present

## 2015-02-25 DIAGNOSIS — Z7901 Long term (current) use of anticoagulants: Secondary | ICD-10-CM | POA: Diagnosis not present

## 2015-02-25 DIAGNOSIS — M65332 Trigger finger, left middle finger: Secondary | ICD-10-CM | POA: Insufficient documentation

## 2015-02-25 DIAGNOSIS — M659 Synovitis and tenosynovitis, unspecified: Secondary | ICD-10-CM | POA: Insufficient documentation

## 2015-02-25 DIAGNOSIS — E785 Hyperlipidemia, unspecified: Secondary | ICD-10-CM | POA: Insufficient documentation

## 2015-02-25 DIAGNOSIS — Z833 Family history of diabetes mellitus: Secondary | ICD-10-CM | POA: Insufficient documentation

## 2015-02-25 DIAGNOSIS — Z888 Allergy status to other drugs, medicaments and biological substances status: Secondary | ICD-10-CM | POA: Diagnosis not present

## 2015-02-25 DIAGNOSIS — K219 Gastro-esophageal reflux disease without esophagitis: Secondary | ICD-10-CM | POA: Diagnosis not present

## 2015-02-25 DIAGNOSIS — Z79899 Other long term (current) drug therapy: Secondary | ICD-10-CM | POA: Insufficient documentation

## 2015-02-25 DIAGNOSIS — Z885 Allergy status to narcotic agent status: Secondary | ICD-10-CM | POA: Insufficient documentation

## 2015-02-25 DIAGNOSIS — Z87891 Personal history of nicotine dependence: Secondary | ICD-10-CM | POA: Diagnosis not present

## 2015-02-25 DIAGNOSIS — I495 Sick sinus syndrome: Secondary | ICD-10-CM | POA: Diagnosis not present

## 2015-02-25 DIAGNOSIS — M1710 Unilateral primary osteoarthritis, unspecified knee: Secondary | ICD-10-CM | POA: Insufficient documentation

## 2015-02-25 DIAGNOSIS — Z9849 Cataract extraction status, unspecified eye: Secondary | ICD-10-CM | POA: Insufficient documentation

## 2015-02-25 DIAGNOSIS — E119 Type 2 diabetes mellitus without complications: Secondary | ICD-10-CM | POA: Diagnosis not present

## 2015-02-25 DIAGNOSIS — Z7984 Long term (current) use of oral hypoglycemic drugs: Secondary | ICD-10-CM | POA: Insufficient documentation

## 2015-02-25 DIAGNOSIS — Z96652 Presence of left artificial knee joint: Secondary | ICD-10-CM | POA: Insufficient documentation

## 2015-02-25 DIAGNOSIS — I10 Essential (primary) hypertension: Secondary | ICD-10-CM | POA: Insufficient documentation

## 2015-02-25 DIAGNOSIS — G4733 Obstructive sleep apnea (adult) (pediatric): Secondary | ICD-10-CM | POA: Insufficient documentation

## 2015-02-25 HISTORY — PX: TRIGGER FINGER RELEASE: SHX641

## 2015-02-25 LAB — POTASSIUM: Potassium: 3.4 mmol/L — ABNORMAL LOW (ref 3.5–5.1)

## 2015-02-25 LAB — GLUCOSE, CAPILLARY
Glucose-Capillary: 126 mg/dL — ABNORMAL HIGH (ref 65–99)
Glucose-Capillary: 125 mg/dL — ABNORMAL HIGH (ref 65–99)

## 2015-02-25 LAB — PROTIME-INR
INR: 1.16
Prothrombin Time: 15 seconds (ref 11.4–15.0)

## 2015-02-25 SURGERY — RELEASE, A1 PULLEY, FOR TRIGGER FINGER
Anesthesia: Spinal | Site: Hand | Laterality: Left | Wound class: Clean

## 2015-02-25 MED ORDER — LIDOCAINE HCL (CARDIAC) 20 MG/ML IV SOLN
INTRAVENOUS | Status: DC | PRN
  Administered 2015-02-25: 60 mg via INTRAVENOUS

## 2015-02-25 MED ORDER — FENTANYL CITRATE (PF) 100 MCG/2ML IJ SOLN
INTRAMUSCULAR | Status: DC | PRN
  Administered 2015-02-25 (×3): 25 ug via INTRAVENOUS

## 2015-02-25 MED ORDER — HYDROCODONE-ACETAMINOPHEN 5-325 MG PO TABS: 1.0000 | ORAL_TABLET | ORAL | Status: DC | PRN

## 2015-02-25 MED ORDER — ONDANSETRON HCL 4 MG/2ML IJ SOLN: 4.0000 mg | Freq: Once | INTRAMUSCULAR | Status: DC | PRN

## 2015-02-25 MED ORDER — ONDANSETRON HCL 4 MG/2ML IJ SOLN
INTRAMUSCULAR | Status: DC | PRN
Start: 2015-02-25 — End: 2015-02-25
  Administered 2015-02-25: 4 mg via INTRAVENOUS

## 2015-02-25 MED ORDER — PROPOFOL 10 MG/ML IV BOLUS
INTRAVENOUS | Status: DC | PRN
  Administered 2015-02-25: 140 mg via INTRAVENOUS

## 2015-02-25 MED ORDER — BUPIVACAINE HCL (PF) 0.25 % IJ SOLN
INTRAMUSCULAR | Status: AC
  Filled 2015-02-25: qty 30

## 2015-02-25 MED ORDER — BUPIVACAINE HCL 0.25 % IJ SOLN
INTRAMUSCULAR | Status: DC | PRN
  Administered 2015-02-25: 6 mL

## 2015-02-25 MED ORDER — FENTANYL CITRATE (PF) 100 MCG/2ML IJ SOLN: 25.0000 ug | INTRAMUSCULAR | Status: DC | PRN

## 2015-02-25 MED ORDER — SODIUM CHLORIDE 0.9 % IV SOLN
INTRAVENOUS | Status: DC
  Administered 2015-02-25: 15:00:00 via INTRAVENOUS

## 2015-02-25 SURGICAL SUPPLY — 23 items
BNDG COHESIVE 4X5 TAN STRL (GAUZE/BANDAGES/DRESSINGS) ×2 IMPLANT
BNDG ESMARK 4X12 TAN STRL LF (GAUZE/BANDAGES/DRESSINGS) ×2 IMPLANT
DRSG DERMACEA 8X12 NADH (GAUZE/BANDAGES/DRESSINGS) ×2 IMPLANT
DURAPREP 26ML APPLICATOR (WOUND CARE) ×2 IMPLANT
GAUZE SPONGE 4X4 12PLY STRL (GAUZE/BANDAGES/DRESSINGS) ×2 IMPLANT
GAUZE STRETCH 2X75IN STRL (MISCELLANEOUS) ×2 IMPLANT
GLOVE BIO SURGEON STRL SZ8 (GLOVE) ×2 IMPLANT
GLOVE BIOGEL M STRL SZ7.5 (GLOVE) ×2 IMPLANT
GLOVE INDICATOR 8.0 STRL GRN (GLOVE) ×2 IMPLANT
GOWN STRL REUS W/ TWL LRG LVL3 (GOWN DISPOSABLE) ×2 IMPLANT
GOWN STRL REUS W/TWL LRG LVL3 (GOWN DISPOSABLE) ×2
KIT RM TURNOVER STRD PROC AR (KITS) ×2 IMPLANT
NDL SAFETY 25GX1.5 (NEEDLE) ×2 IMPLANT
NS IRRIG 500ML POUR BTL (IV SOLUTION) ×2 IMPLANT
PACK EXTREMITY ARMC (MISCELLANEOUS) ×2 IMPLANT
PAD GROUND ADULT SPLIT (MISCELLANEOUS) ×2 IMPLANT
STOCKINETTE STRL 4IN 9604848 (GAUZE/BANDAGES/DRESSINGS) ×2 IMPLANT
SUT ETHILON 5-0 FS-2 18 BLK (SUTURE) ×2 IMPLANT
SUT PROLENE 4-0 (SUTURE) ×1
SUT PROLENE 4-0 RB1 30XMFL BLU (SUTURE) ×1
SUT PROLENE 6 0 CC 1 (SUTURE) ×2 IMPLANT
SUTURE PROLEN 4-0 RB1 30XMFL (SUTURE) ×1 IMPLANT
SYRINGE 10CC LL (SYRINGE) ×4 IMPLANT

## 2015-02-25 NOTE — Anesthesia Postprocedure Evaluation (Signed)
Anesthesia Post Note  Patient: Michael Colon  Procedure(s) Performed: Procedure(s) (LRB): LEFT LONG TRIGGER RELEASE (Left)  Patient location during evaluation: Other Anesthesia Type: General Level of consciousness: awake and alert Pain management: pain level controlled Vital Signs Assessment: post-procedure vital signs reviewed and stable Respiratory status: spontaneous breathing, nonlabored ventilation, respiratory function stable and patient connected to nasal cannula oxygen Cardiovascular status: blood pressure returned to baseline and stable Postop Assessment: no signs of nausea or vomiting Anesthetic complications: no    Last Vitals:  Filed Vitals:   02/25/15 1808 02/25/15 1830  BP: 135/86 118/74  Pulse: 59 62  Temp: 36.4 C   Resp: 16 16    Last Pain:  Filed Vitals:   02/25/15 1831  PainSc: 0-No pain                 [name]

## 2015-02-25 NOTE — Transfer of Care (Signed)
Immediate Anesthesia Transfer of Care Note  Patient: Michael Colon  Procedure(s) Performed: Procedure(s): LEFT LONG TRIGGER RELEASE (Left)  Patient Location: PACU  Anesthesia Type:General  Level of Consciousness: sedated  Airway & Oxygen Therapy: Patient Spontanous Breathing and Patient connected to face mask oxygen  Post-op Assessment: Report given to RN and Post -op Vital signs reviewed and stable  Post vital signs: Reviewed and stable  Last Vitals:  Filed Vitals:   02/25/15 1441 02/25/15 1730  BP: 139/89 130/88  Pulse: 64 62  Temp: 36 C 97.52F  Resp: 16 13    Complications: No apparent anesthesia complications

## 2015-02-25 NOTE — Op Note (Signed)
OPERATIVE NOTE  DATE OF SURGERY:  02/25/2015  PATIENT NAME:  Michael Colon   DOB: 06/22/1930  MRN: [id number]  PRE-OPERATIVE DIAGNOSIS: Stenosing tenosynovitis of the left long finger  POST-OPERATIVE DIAGNOSIS:  Same  PROCEDURE:  Left long trigger finger release  SURGEON:  Dereck Leep, Jr. M.D.  ANESTHESIA: general  ESTIMATED BLOOD LOSS: Minimal  FLUIDS REPLACED: 1000 mL of crystalloid  TOURNIQUET TIME: 25 minutes  DRAINS: None  INDICATIONS FOR SURGERY: [name] is a 82 y.o. year old male with a history of triggering and locking of the left long finger. The patient had not seen any significant improvement despite conservative nonsurgical intervention. After discussion of the risks and benefits of surgical intervention, the patient expressed understanding of the risks benefits and agree with plans for trigger finger release.   PROCEDURE IN DETAIL: The patient was brought into the operating room and after adequate general anesthesia, a tourniquet was placed on the patient's left upper arm.The left hand and arm were prepped with alcohol and Duraprep and draped in the usual sterile fashion. A "time-out" was performed as per usual protocol. The hand and forearm were exsanguinated using an Esmarch and the tourniquet was inflated to 250 mmHg. Loupe magnification was used throughout the procedure.  A transverse incision was made at the proximal extent of the A1 pulley of the long finger. Blunt dissection was carried down to the tendon sheath and the edge of the A1 pulley was visualized. The A1 pulley was sharply incised using tenotomy scissors. Complete release of the A1 pulley was achieved. Smooth excursion of the flexor tendon was visualized. The wound was irrigated with copious amounts of fluid. The skin edges were reapproximated using #5-0 nylon. 0.25% Marcaine was injected along the incision site. The tourniquet was deflated after total tourniquet time of 25 minutes.  A sterile  dressing was applied.  The patient tolerated the procedure well and was transported to the recovery room in stable condition.  James P. Holley Bouche M.D.

## 2015-02-25 NOTE — Anesthesia Preprocedure Evaluation (Addendum)
Anesthesia Evaluation  Patient identified by MRN, date of birth, ID band Patient awake    Reviewed: Allergy & Precautions, NPO status , Patient's Chart, lab work & pertinent test results, reviewed documented beta blocker date and time   Airway Mallampati: III  TM Distance: >3 FB     Dental  (+) Chipped, Upper Dentures, Partial Lower, Dental Advisory Given   Pulmonary asthma , sleep apnea and Continuous Positive Airway Pressure Ventilation , former smoker,           Cardiovascular hypertension, Pt. on medications   Tacchy/brady syndrome   Neuro/Psych TIACVA negative psych ROS   GI/Hepatic Neg liver ROS, GERD  Medicated and Controlled,  Endo/Other  diabetes, Well Controlled, Type 2  Renal/GU Renal InsufficiencyRenal disease   BPH    Musculoskeletal  (+) Arthritis , Osteoarthritis,    Abdominal   Peds negative pediatric ROS (+)  Hematology   Anesthesia Other Findings Pt denies having a stroke. Will use cpap tonite in the hospital. Hx of Vtach. A flutter. No problems with asthma for many years.  Cannot remove upper dentures. No problem with reflux. Will proceed with LMA.  Reproductive/Obstetrics                            Anesthesia Physical  Anesthesia Plan  ASA: III  Anesthesia Plan:    Post-op Pain Management:    Induction: Intravenous  Airway Management Planned: LMA  Additional Equipment:   Intra-op Plan:   Post-operative Plan:   Informed Consent: I have reviewed the patients History and Physical, chart, labs and discussed the procedure including the risks, benefits and alternatives for the proposed anesthesia with the patient or authorized representative who has indicated his/her understanding and acceptance.     Plan Discussed with: CRNA  Anesthesia Plan Comments:        Anesthesia Quick Evaluation

## 2015-02-25 NOTE — Anesthesia Procedure Notes (Signed)
Procedure Name: LMA Insertion Performed by: Dionne Bucy Pre-anesthesia Checklist: Patient identified, Patient being monitored, Timeout performed, Emergency Drugs available and Suction available Patient Re-evaluated:Patient Re-evaluated prior to inductionOxygen Delivery Method: Circle system utilized Preoxygenation: Pre-oxygenation with 100% oxygen Intubation Type: IV induction Ventilation: Mask ventilation without difficulty LMA: LMA inserted LMA Size: 5.0 Tube type: Oral Number of attempts: 1 Placement Confirmation: positive ETCO2 and breath sounds checked- equal and bilateral Tube secured with: Tape Dental Injury: Teeth and Oropharynx as per pre-operative assessment

## 2015-02-25 NOTE — H&P (Signed)
The patient has been re-examined, and the chart reviewed, and there have been no interval changes to the documented history and physical.    The risks, benefits, and alternatives have been discussed at length. The patient expressed understanding of the risks benefits and agreed with plans for surgical intervention.  Michael Colon, Jr. M.D.    

## 2015-02-25 NOTE — Brief Op Note (Signed)
02/25/2015  5:32 PM  PATIENT:  Michael Colon  82 y.o. male  PRE-OPERATIVE DIAGNOSIS:  Left long trigger finger (stenosing tenosynovitis)  POST-OPERATIVE DIAGNOSIS:  SAME  PROCEDURE:  Procedure(s): LEFT LONG TRIGGER RELEASE (Left)  SURGEON:  Surgeon(s) and Role:    * Dereck Leep, MD - Primary  ASSISTANTS: none   ANESTHESIA:   general  EBL:  Total I/O In: 1000 [I.V.:1000] Out: 2 [Blood:2]  BLOOD ADMINISTERED:none  DRAINS: none   LOCAL MEDICATIONS USED:  MARCAINE     SPECIMEN:  No Specimen  DISPOSITION OF SPECIMEN:  N/A  COUNTS:  YES  TOURNIQUET:   25 minutes  DICTATION: .Dragon Dictation  PLAN OF CARE: Discharge to home after PACU  PATIENT DISPOSITION:  PACU - hemodynamically stable.   Delay start of Pharmacological VTE agent (>24hrs) due to surgical blood loss or risk of bleeding: not applicable

## 2015-02-25 NOTE — OR Nursing (Signed)
Pt states he fell  02-23-2015 has bandage on left elbow covering an abrasion

## 2015-02-25 NOTE — Discharge Instructions (Signed)
°  Instructions after Hand / Wrist Surgery ° ° James P. Hooten, Jr., M.D. ° Dept. of Orthopaedics & Sports Medicine ° [facility] ° [address] ° [place], [place]  [address] ° ° Phone: [phone]   Fax: [phone] ° ° °DIET: °• Drink plenty of non-alcoholic fluids & begin a light diet. °• Resume your normal diet the day after surgery. ° °ACTIVITY:  °• Keep the hand elevated above the level of the elbow. °• Begin gently moving the fingers on a regular basis to avoid stiffness. °• Avoid any heavy lifting, pushing, or pulling with the operative hand. °• Do not drive or operate any equipment until instructed. ° °WOUND CARE:  °• Keep the splint/bandage clean and dry.  °• The splint and stitches will be removed in the office. °• Continue to use the ice packs periodically to reduce pain and swelling. °• You may bathe or shower after the stitches are removed at the first office visit following surgery. ° °MEDICATIONS: °• You may resume your regular medications. °• Please take the pain medication as prescribed. °• Do not take pain medication on an empty stomach. °• Do not drive or drink alcoholic beverages when taking pain medications. ° °CALL THE OFFICE FOR: °• Temperature above 101 degrees °• Excessive bleeding or drainage on the dressing. °• Excessive swelling, coldness, or paleness of the fingers. °• Persistent nausea and vomiting. ° °FOLLOW-UP:  °• You should have an appointment to return to the office in 7-10 days after surgery.  ° °REMEMBER: R.I.C.E. = Rest, Ice, Compression, Elevation !  ° ° ° °AMBULATORY SURGERY  °DISCHARGE INSTRUCTIONS ° ° °1) The drugs that you were given will stay in your system until tomorrow so for the next 24 hours you should not: ° °A) Drive an automobile °B) Make any legal decisions °C) Drink any alcoholic beverage ° ° °2) You may resume regular meals tomorrow.  Today it is better to start with liquids and gradually work up to solid foods. ° °You may eat anything you prefer, but  it is better to start with liquids, then soup and crackers, and gradually work up to solid foods. ° ° °3) Please notify your doctor immediately if you have any unusual bleeding, trouble breathing, redness and pain at the surgery site, drainage, fever, or pain not relieved by medication. ° ° ° °4) Additional Instructions: ° ° ° ° ° ° ° °Please contact your physician with any problems or Same Day Surgery at [phone], Monday through Friday 6 am to 4 pm, or [place] [facility] [place] Main number at [phone]. °

## 2015-02-26 ENCOUNTER — Encounter: Payer: Self-pay | Admitting: Orthopedic Surgery

## 2015-03-03 DIAGNOSIS — Z96652 Presence of left artificial knee joint: Secondary | ICD-10-CM | POA: Diagnosis not present

## 2015-03-03 DIAGNOSIS — M65332 Trigger finger, left middle finger: Secondary | ICD-10-CM | POA: Diagnosis not present

## 2015-03-11 ENCOUNTER — Ambulatory Visit (INDEPENDENT_AMBULATORY_CARE_PROVIDER_SITE_OTHER): Payer: Medicare Other

## 2015-03-11 DIAGNOSIS — Z7901 Long term (current) use of anticoagulants: Secondary | ICD-10-CM | POA: Diagnosis not present

## 2015-03-11 DIAGNOSIS — Z5181 Encounter for therapeutic drug level monitoring: Secondary | ICD-10-CM

## 2015-03-11 DIAGNOSIS — I4892 Unspecified atrial flutter: Secondary | ICD-10-CM | POA: Diagnosis not present

## 2015-03-11 LAB — POCT INR: INR: 2

## 2015-03-12 ENCOUNTER — Other Ambulatory Visit: Payer: Self-pay | Admitting: Internal Medicine

## 2015-03-23 ENCOUNTER — Ambulatory Visit (INDEPENDENT_AMBULATORY_CARE_PROVIDER_SITE_OTHER): Payer: Medicare Other | Admitting: Cardiovascular Disease

## 2015-03-23 ENCOUNTER — Encounter: Payer: Self-pay | Admitting: Cardiovascular Disease

## 2015-03-23 VITALS — BP 120/70 | HR 67 | Ht 71.0 in | Wt 241.0 lb

## 2015-03-23 DIAGNOSIS — I482 Chronic atrial fibrillation, unspecified: Secondary | ICD-10-CM

## 2015-03-23 DIAGNOSIS — E119 Type 2 diabetes mellitus without complications: Secondary | ICD-10-CM

## 2015-03-23 DIAGNOSIS — L851 Acquired keratosis [keratoderma] palmaris et plantaris: Secondary | ICD-10-CM | POA: Diagnosis not present

## 2015-03-23 DIAGNOSIS — I10 Essential (primary) hypertension: Secondary | ICD-10-CM

## 2015-03-23 DIAGNOSIS — I484 Atypical atrial flutter: Secondary | ICD-10-CM

## 2015-03-23 DIAGNOSIS — E785 Hyperlipidemia, unspecified: Secondary | ICD-10-CM

## 2015-03-23 DIAGNOSIS — E1142 Type 2 diabetes mellitus with diabetic polyneuropathy: Secondary | ICD-10-CM | POA: Diagnosis not present

## 2015-03-23 DIAGNOSIS — B351 Tinea unguium: Secondary | ICD-10-CM | POA: Diagnosis not present

## 2015-03-23 NOTE — Assessment & Plan Note (Signed)
Continues to be in atrial flutter, asymptomatic. Recommended he stay on his anticoagulation.

## 2015-03-23 NOTE — Assessment & Plan Note (Signed)
Blood pressure is well controlled on today's visit. No changes made to the medications. 

## 2015-03-23 NOTE — Patient Instructions (Signed)
You are doing well. No medication changes were made.  Please research CT coronary calcium score to look for coronary blockage Consider a cholesterol medication, such as atorvastatin (lipitor) 10 mg once a day  Please call us if you have new issues that need to be addressed before your next appt.  Your physician wants you to follow-up in: 6 months.  You will receive a reminder letter in the mail two months in advance. If you don't receive a letter, please call our office to schedule the follow-up appointment.

## 2015-03-23 NOTE — Assessment & Plan Note (Signed)
We have encouraged continued exercise, careful diet management in an effort to lose weight. 

## 2015-03-23 NOTE — Assessment & Plan Note (Signed)
Cholesterol is elevated, hemoglobin A1c elevated, prior smoking history For risk stratification recommended he consider CT coronary calcium scoring If score is elevated, would consider cholesterol medication to reach goal LDL He will research this, talk with Dr. Silvio Pate Test was discussed with him in detail   Total encounter time more than 25 minutes  Greater than 50% was spent in counseling and coordination of care with the patient

## 2015-03-23 NOTE — Progress Notes (Signed)
Patient ID: Michael Colon, male    DOB: 11/02/1930, 82 y.o.   MRN: [id number]  HPI Comments: Michael Colon is a very pleasant 82 year old gentleman with history of atrial flutter (3/14), previous ablation , on warfarin, hypertension, borderline diabetes, Obstructive sleep apnea who presented to [facility] on August 04, 2010 with chest pain. He is 82 years old Symptoms started after eating and it was felt he had GI spasm or hiatal hernia. Mild improvement in symptoms with nitroglycerin and GI cocktail in the emergency room. He is very active at baseline. Noted to be in atrial flutter March 2014. He reports having a TIA some time in 2014, workup negative at [facility]  Previous vein ablation surgery at [facility] diabetes type 2 He presents today for follow-up of his atrial flutter, hyperlipidemia  He reports overall feeling well, active, no significant shortness of breath. Chronic leg swelling worse on the left leg than the right secondary to previous knee surgeries Denies any chest pain concerning for angina. Last stress test was 2012 Tolerating warfarin, no complications Lab work reviewed with him showing hemoglobin A1c 7.5, total cholesterol 214  EKG on today's visit shows normal sinus rhythm with rate 66 bpm, right bundle branch block  Other past medical history Previously evaluated for bradycardia. Pacemaker was not recommended at that time. Previously seen by Dr. Caryl Comes, EP Previous Holter monitor showed normal sinus rhythm with pauses up to 2.86 seconds, bradycardia with heart rates in the 20s to 30s at nighttime, 30s to 50 during the daytime.  Hemoglobin A1c in December 2014 7.5, total cholesterol up from 132 now 200   Allergies  Allergen Reactions  . Enoxaparin Sodium Hives  . Latex Rash  . Nickel Rash  . Percocet [Oxycodone-Acetaminophen] Rash    Outpatient Encounter Prescriptions as of 03/23/2015  Medication Sig  . BAYER CONTOUR TEST test strip USE STRIP AS INSTRUCTED TO TEST BLOOD SUGAR TWICE DAILY  . docusate  sodium (COLACE) 100 MG capsule Take 100 mg by mouth 2 (two) times daily.   . famotidine (PEPCID) 20 MG tablet Take 20 mg by mouth daily.   . finasteride (PROSCAR) 5 MG tablet TAKE ONE TABLET BY MOUTH ONCE DAILY  . fish oil-omega-3 fatty acids 1000 MG capsule Take 1 g by mouth daily.   . Glucosamine Sulfate-MSM (GLUCOSAMINE-MSM DS) 500-500 MG TABS Take 1,500 mg by mouth 2 (two) times daily.   Marland Kitchen glucose blood test strip Use as instructed to test blood sugar once daily dx: 250.00  . ibuprofen (ADVIL,MOTRIN) 200 MG tablet Take 400 mg by mouth 2 (two) times daily.  Marland Kitchen loratadine (CLARITIN) 10 MG tablet Take 10 mg by mouth daily.    . metFORMIN (GLUCOPHAGE) 500 MG tablet Take 1 tablet (500 mg total) by mouth 2 (two) times daily with a meal.  . MICROLET LANCETS MISC Use to test twice daily or as directed  . Multiple Vitamins-Minerals (CENTRUM SILVER PO) Take 1 tablet by mouth daily after breakfast.   . nitroGLYCERIN (NITROSTAT) 0.4 MG SL tablet Place 0.4 mg under the tongue every 5 (five) minutes as needed for chest pain.  . tamsulosin (FLOMAX) 0.4 MG CAPS capsule TAKE ONE CAPSULE BY MOUTH ONCE DAILY  . triamterene-hydrochlorothiazide (DYAZIDE) 37.5-25 MG capsule TAKE ONE CAPSULE BY MOUTH IN THE MORNING  . warfarin (COUMADIN) 5 MG tablet TAKE ONE TO ONE & ONE-HALF TABLETS BY MOUTH ONCE DAILY AS DIRECTED  . [DISCONTINUED] HYDROcodone-acetaminophen (NORCO) 5-325 MG tablet Take 1-2 tablets by mouth every 4 (four) hours as needed for moderate  pain. (Patient not taking: Reported on 03/23/2015)   No facility-administered encounter medications on file as of 03/23/2015.    Past Medical History  Diagnosis Date  . Chronic prostatitis   . Obstructive sleep apnea     CPAP-9  . Chronic ear infection   . Asthma   . GERD (gastroesophageal reflux disease)     laryngeal involvement  . Hypertension   . Osteoarthrosis, localized, primary, knee     post-traumatic  . Diabetes mellitus   . Tachy-brady syndrome  ([place])   . Atrial flutter ([place]) 2007  . Benign prostatic hypertrophy   . Hyperlipidemia   . Band keratopathy   . UTI (urinary tract infection)   . Prostatitis   . Chronic kidney disease   . Stroke [facility])     TIA    Past Surgical History  Procedure Laterality Date  . Mastoidectomy  8/08    Dr Idelle Crouch  . Rhinoplasty  5/10    and septoplasty  . Cataract extraction, bilateral  2009  . Belpharoptosis repair      Dr [name]---didn't resolve weepy eye and eyelid drooping  . Knee surgery  1998    plate after fracture, then removed for infection  . Cardioversion  04/13/2010  . Chest pain  8/12    Stress test benign  . Appendectomy    . Left elbow surgery    . Venous ablation    . Tear duct probing  11/13    Dr Vickki Muff  . Subacromial decompression Right 2005    Arthroscopic (for rotator cuff and biceps tendon ruptures)  . Eye surgery    . Fracture surgery Left     elbow  . Total knee arthroplasty Left 09/01/2014    Procedure: TOTAL KNEE ARTHROPLASTY;  Surgeon: Dereck Leep, MD;  Location: [facility] ORS;  Service: Orthopedics;  Laterality: Left;  . Trigger finger release Left 02/25/2015    Procedure: LEFT LONG TRIGGER RELEASE;  Surgeon: Dereck Leep, MD;  Location: [facility] ORS;  Service: Orthopedics;  Laterality: Left;    Social History  reports that he quit smoking about 46 years ago. His smoking use included Cigarettes. He smoked 1.00 pack per day for 0 years. He has never used smokeless tobacco. He reports that he drinks about 0.6 oz of alcohol per week. He reports that he does not use illicit drugs.  Family History family history includes Colon cancer in his father; Diabetes in his father; Hypertension in his father; Other in his mother. There is no history of Heart attack or Stroke.       Review of Systems  Constitutional: Negative.   Respiratory: Negative.   Cardiovascular: Positive for leg swelling.  Gastrointestinal: Negative.   Musculoskeletal: Positive for arthralgias.   Skin: Negative.   Allergic/Immunologic: Negative.   Neurological: Negative.   Hematological: Negative.   Psychiatric/Behavioral: Negative.   All other systems reviewed and are negative.   BP 120/70 mmHg  Pulse 67  Ht 5\' 11"  (1.803 m)  Wt 241 lb (109.317 kg)  BMI 33.63 kg/m2  Physical Exam  Constitutional: He is oriented to person, place, and time. He appears well-developed and well-nourished.  HENT:  Head: Normocephalic.  Nose: Nose normal.  Mouth/Throat: Oropharynx is clear and moist.  Eyes: Conjunctivae are normal. Pupils are equal, round, and reactive to light.  Neck: Normal range of motion. Neck supple. No JVD present.  Cardiovascular: Normal rate, regular rhythm, S1 normal, S2 normal, normal heart sounds and intact distal pulses.  Exam reveals  no gallop and no friction rub.   No murmur heard. Trace edema above the sock line  Pulmonary/Chest: Effort normal and breath sounds normal. No respiratory distress. He has no wheezes. He has no rales. He exhibits no tenderness.  Abdominal: Soft. Bowel sounds are normal. He exhibits no distension. There is no tenderness.  Musculoskeletal: Normal range of motion. He exhibits edema. He exhibits no tenderness.  Lymphadenopathy:    He has no cervical adenopathy.  Neurological: He is alert and oriented to person, place, and time. Coordination normal.  Skin: Skin is warm and dry. No rash noted. No erythema.  Psychiatric: He has a normal mood and affect. His behavior is normal. Judgment and thought content normal.      Assessment and Plan   Nursing note and vitals reviewed.

## 2015-03-24 ENCOUNTER — Telehealth: Payer: Self-pay | Admitting: Cardiovascular Disease

## 2015-03-24 DIAGNOSIS — E785 Hyperlipidemia, unspecified: Secondary | ICD-10-CM

## 2015-03-24 NOTE — Telephone Encounter (Signed)
Spoke w/ pt.  Pt sched for CT Calcium Score 4/3 @ 1:30. Pt verbalizes understanding to arrive @ 1:15 and there is a one-time fee of $150 due at the time of his procedure.

## 2015-03-24 NOTE — Telephone Encounter (Signed)
Patient called and would like to schedule the CT coronary calcium score.

## 2015-04-06 ENCOUNTER — Ambulatory Visit (INDEPENDENT_AMBULATORY_CARE_PROVIDER_SITE_OTHER)
Admission: RE | Admit: 2015-04-06 | Discharge: 2015-04-06 | Disposition: A | Discharge status: Home / Self Care | Payer: Self-pay | Source: Ambulatory Visit | Attending: Cardiovascular Disease | Admitting: Cardiovascular Disease

## 2015-04-06 DIAGNOSIS — E785 Hyperlipidemia, unspecified: Secondary | ICD-10-CM

## 2015-04-09 ENCOUNTER — Other Ambulatory Visit: Payer: Self-pay

## 2015-04-09 DIAGNOSIS — I2510 Atherosclerotic heart disease of native coronary artery without angina pectoris: Secondary | ICD-10-CM

## 2015-04-09 MED ORDER — ATORVASTATIN CALCIUM 40 MG PO TABS: 40.0000 mg | ORAL_TABLET | Freq: Every day | ORAL | Status: DC

## 2015-04-13 ENCOUNTER — Other Ambulatory Visit: Payer: Medicare Other

## 2015-04-15 ENCOUNTER — Encounter
Admission: RE | Admit: 2015-04-15 | Discharge: 2015-04-15 | Disposition: A | Discharge status: Home / Self Care | Payer: Medicare Other | Source: Ambulatory Visit | Attending: Cardiovascular Disease | Admitting: Cardiovascular Disease

## 2015-04-15 DIAGNOSIS — I2510 Atherosclerotic heart disease of native coronary artery without angina pectoris: Secondary | ICD-10-CM

## 2015-04-15 MED ORDER — TECHNETIUM TC 99M SESTAMIBI - CARDIOLITE
13.8700 | Freq: Once | INTRAVENOUS | Status: AC | PRN
  Administered 2015-04-15: 09:00:00 13.87 via INTRAVENOUS

## 2015-04-15 MED ORDER — TECHNETIUM TC 99M SESTAMIBI - CARDIOLITE
31.6120 | Freq: Once | INTRAVENOUS | Status: AC | PRN
  Administered 2015-04-15: 10:00:00 31.612 via INTRAVENOUS

## 2015-04-16 LAB — NM MYOCAR MULTI W/SPECT W/WALL MOTION / EF
LV dias vol: 93 mL (ref 62–150)
LV sys vol: 31 mL
Peak HR: 64 {beats}/min
Percent HR: 46 %
Rest HR: 51 {beats}/min
SDS: 1
SRS: 1
SSS: 1
TID: 0.88

## 2015-04-21 ENCOUNTER — Telehealth: Payer: Self-pay | Admitting: Cardiovascular Disease

## 2015-04-21 DIAGNOSIS — H18423 Band keratopathy, bilateral: Secondary | ICD-10-CM | POA: Diagnosis not present

## 2015-04-21 NOTE — Telephone Encounter (Signed)
Reviewed results w/ pt's wife:  Pharmacological myocardial perfusion imaging study with no significant ischemia Normal wall motion, EF estimated at 67% No EKG changes concerning for ischemia at peak stress or in recovery. Low risk scan  She verbalizes understanding, but is concerned about pt's atorvastatin.  Pt has h/o hypercalcemia and problems w/ his eyes. He was advised to stop all calcium supplement.  She reports that pt is hesitant to take atorvastatin calcium due to this. Advised her that I will make Dr. Rockey Situ aware of their concerns and call her back w/ his recommendation.

## 2015-04-21 NOTE — Telephone Encounter (Signed)
Patient is wanting his stress test results. Thanks

## 2015-04-22 ENCOUNTER — Ambulatory Visit (INDEPENDENT_AMBULATORY_CARE_PROVIDER_SITE_OTHER): Payer: Medicare Other

## 2015-04-22 ENCOUNTER — Telehealth: Payer: Self-pay | Admitting: Cardiovascular Disease

## 2015-04-22 DIAGNOSIS — I484 Atypical atrial flutter: Secondary | ICD-10-CM

## 2015-04-22 DIAGNOSIS — Z5181 Encounter for therapeutic drug level monitoring: Secondary | ICD-10-CM

## 2015-04-22 DIAGNOSIS — I4892 Unspecified atrial flutter: Secondary | ICD-10-CM

## 2015-04-22 DIAGNOSIS — Z7901 Long term (current) use of anticoagulants: Secondary | ICD-10-CM

## 2015-04-22 LAB — POCT INR: INR: 2.5

## 2015-04-22 NOTE — Telephone Encounter (Signed)
Patient called in regarding mychart message with abbreviations about stress test that he did not understand. Reviewed stress test results with him and he had no further questions at this time.

## 2015-04-22 NOTE — Telephone Encounter (Signed)
Patient would like his results on his most recent labs.

## 2015-04-22 NOTE — Telephone Encounter (Signed)
Reviewed Dr. Donivan Scull information with patients wife and she stated that they had already spoke to someone earlier today here in the clinic regarding this and it was cleared up. Patient is not planning to change his medications and was just confused because prescription label had calcium written on it. She had no further questions at this time and let her know to please call back if they have any further questions or problems.

## 2015-04-22 NOTE — Telephone Encounter (Signed)
Lipitor will not affect calcium level, this is not a concern  Major concern his coronary blockage that is present in his heart Coronary calcium score of 1349. This was 23 percentile for age and sex matched control.  Needs aggressive cholesterol management with a statin High risk of heart attack given diabetes, high cholesterol, prior smoking history

## 2015-05-11 ENCOUNTER — Telehealth: Payer: Self-pay | Admitting: Cardiovascular Disease

## 2015-05-11 MED ORDER — ATORVASTATIN CALCIUM 40 MG PO TABS: 40.0000 mg | ORAL_TABLET | Freq: Every day | ORAL | Status: DC

## 2015-05-11 NOTE — Telephone Encounter (Signed)
°*  STAT* If patient is at the pharmacy, call can be transferred to refill team.   1. Which medications need to be refilled? (please list name of each medication and dose if known) Atorvastatin 40 mg, took half tab for 2 weeks then went on full tablet. Pt is not sure of the dose he is to take.   2. Which pharmacy/location (including street and city if local pharmacy) is medication to be sent to? [facility] on [address]  3. Do they need a 30 day or 90 day supply?[place]

## 2015-05-11 NOTE — Telephone Encounter (Signed)
Notified patient need to increase to Atorvastatin 40 mg take one tablet daily. Gave 90 day supply with 3 refills.

## 2015-06-03 ENCOUNTER — Ambulatory Visit (INDEPENDENT_AMBULATORY_CARE_PROVIDER_SITE_OTHER): Payer: Medicare Other

## 2015-06-03 DIAGNOSIS — I484 Atypical atrial flutter: Secondary | ICD-10-CM | POA: Diagnosis not present

## 2015-06-03 DIAGNOSIS — Z7901 Long term (current) use of anticoagulants: Secondary | ICD-10-CM | POA: Diagnosis not present

## 2015-06-03 DIAGNOSIS — I4892 Unspecified atrial flutter: Secondary | ICD-10-CM | POA: Diagnosis not present

## 2015-06-03 DIAGNOSIS — Z5181 Encounter for therapeutic drug level monitoring: Secondary | ICD-10-CM

## 2015-06-03 LAB — POCT INR: INR: 2.8

## 2015-06-08 DIAGNOSIS — L851 Acquired keratosis [keratoderma] palmaris et plantaris: Secondary | ICD-10-CM | POA: Diagnosis not present

## 2015-06-08 DIAGNOSIS — E1142 Type 2 diabetes mellitus with diabetic polyneuropathy: Secondary | ICD-10-CM | POA: Diagnosis not present

## 2015-06-08 DIAGNOSIS — B351 Tinea unguium: Secondary | ICD-10-CM | POA: Diagnosis not present

## 2015-06-14 DIAGNOSIS — S81812A Laceration without foreign body, left lower leg, initial encounter: Secondary | ICD-10-CM | POA: Diagnosis not present

## 2015-06-23 ENCOUNTER — Ambulatory Visit: Payer: Medicare Other | Admitting: Internal Medicine

## 2015-06-25 ENCOUNTER — Encounter: Payer: Self-pay | Admitting: Internal Medicine

## 2015-06-25 ENCOUNTER — Ambulatory Visit (INDEPENDENT_AMBULATORY_CARE_PROVIDER_SITE_OTHER): Payer: Medicare Other | Admitting: Internal Medicine

## 2015-06-25 VITALS — BP 122/80 | HR 64 | Temp 97.7°F | Wt 240.0 lb

## 2015-06-25 DIAGNOSIS — E1140 Type 2 diabetes mellitus with diabetic neuropathy, unspecified: Secondary | ICD-10-CM

## 2015-06-25 DIAGNOSIS — D696 Thrombocytopenia, unspecified: Secondary | ICD-10-CM

## 2015-06-25 DIAGNOSIS — I2510 Atherosclerotic heart disease of native coronary artery without angina pectoris: Secondary | ICD-10-CM | POA: Diagnosis not present

## 2015-06-25 DIAGNOSIS — I4892 Unspecified atrial flutter: Secondary | ICD-10-CM | POA: Diagnosis not present

## 2015-06-25 DIAGNOSIS — H9220 Otorrhagia, unspecified ear: Secondary | ICD-10-CM | POA: Insufficient documentation

## 2015-06-25 DIAGNOSIS — H9222 Otorrhagia, left ear: Secondary | ICD-10-CM

## 2015-06-25 DIAGNOSIS — G4733 Obstructive sleep apnea (adult) (pediatric): Secondary | ICD-10-CM | POA: Insufficient documentation

## 2015-06-25 DIAGNOSIS — N400 Enlarged prostate without lower urinary tract symptoms: Secondary | ICD-10-CM

## 2015-06-25 DIAGNOSIS — I10 Essential (primary) hypertension: Secondary | ICD-10-CM

## 2015-06-25 LAB — CBC WITH DIFFERENTIAL/PLATELET
Basophils Absolute: 0 10*3/uL (ref 0.0–0.1)
Basophils Relative: 0.4 % (ref 0.0–3.0)
Eosinophils Absolute: 0.5 10*3/uL (ref 0.0–0.7)
Eosinophils Relative: 6.2 % — ABNORMAL HIGH (ref 0.0–5.0)
HCT: 45.3 % (ref 39.0–52.0)
Hemoglobin: 15.3 g/dL (ref 13.0–17.0)
Lymphocytes Relative: 14.8 % (ref 12.0–46.0)
Lymphs Abs: 1.3 10*3/uL (ref 0.7–4.0)
MCHC: 33.8 g/dL (ref 30.0–36.0)
MCV: 91.4 fl (ref 78.0–100.0)
Monocytes Absolute: 1 10*3/uL (ref 0.1–1.0)
Monocytes Relative: 11.3 % (ref 3.0–12.0)
Neutro Abs: 5.8 10*3/uL (ref 1.4–7.7)
Neutrophils Relative %: 67.3 % (ref 43.0–77.0)
Platelets: 128 10*3/uL — ABNORMAL LOW (ref 150.0–400.0)
RBC: 4.95 Mil/uL (ref 4.22–5.81)
RDW: 13.2 % (ref 11.5–15.5)
WBC: 8.6 10*3/uL (ref 4.0–10.5)

## 2015-06-25 LAB — HEMOGLOBIN A1C: Hgb A1c MFr Bld: 7.7 % — ABNORMAL HIGH (ref 4.6–6.5)

## 2015-06-25 MED ORDER — NEOMYCIN-POLYMYXIN-HC 3.5-10000-1 OT SUSP: 4.0000 [drp] | Freq: Four times a day (QID) | OTIC | Status: DC

## 2015-06-25 NOTE — Assessment & Plan Note (Signed)
Will try cortisporin ENT if persists

## 2015-06-25 NOTE — Assessment & Plan Note (Signed)
Mild Will recheck given the ear bleeding

## 2015-06-25 NOTE — Progress Notes (Signed)
Pre visit review using our clinic review tool, if applicable. No additional management support is needed unless otherwise documented below in the visit note. 

## 2015-06-25 NOTE — Assessment & Plan Note (Signed)
Stable CPAP not working so well Will set up for evaluation with sleep

## 2015-06-25 NOTE — Assessment & Plan Note (Signed)
Still seems to have reasonable control Early neuropathy--no Rx needed

## 2015-06-25 NOTE — Assessment & Plan Note (Signed)
Voiding okay on dual therapy 

## 2015-06-25 NOTE — Addendum Note (Signed)
Addended by: Marchia Bond on: 06/25/2015 04:29 PM   Modules accepted: Miquel Dunn

## 2015-06-25 NOTE — Assessment & Plan Note (Signed)
BP Readings from Last 3 Encounters:  06/25/15 122/80  03/23/15 120/70  02/25/15 118/74   Good control

## 2015-06-25 NOTE — Progress Notes (Signed)
Subjective:    Patient ID: Michael Colon, male    DOB: 03-Feb-1930, 82 y.o.   MRN: [id number]  HPI Here for follow up of diabetes and other conditions He has several concerns also  Noted bleeding from left ear at lunchtime today No trauma No pain Seems to be from the canal No cold symptoms No drainage Hearing about the same  Had injury about 5 days ago Photographing quilts on bridge at [facility] Leg came down on corner of left calf Seen at urgent care Now blood is dried up--no stitches due to timing (just steristrips)  Uses CPAP nightly. 9cm he thinks Not sure it is working right--noticing more sleepiness in day Wants reevaluation  Has recovered well since knee surgery Renewed handicapped permit  Checks sugars many mornings 130-160 fasting No hypoglycemia Feet may feel different at night--but no ongoing sensory changes or pain  No chest pain No SOB No palpitations Pulse low at times (47) but no symptoms with this  Current Outpatient Prescriptions on File Prior to Visit  Medication Sig Dispense Refill  . atorvastatin (LIPITOR) 40 MG tablet Take 1 tablet (40 mg total) by mouth daily. 90 tablet 3  . BAYER CONTOUR TEST test strip USE STRIP AS INSTRUCTED TO TEST BLOOD SUGAR TWICE DAILY 100 each 11  . docusate sodium (COLACE) 100 MG capsule Take 100 mg by mouth 2 (two) times daily.     . famotidine (PEPCID) 20 MG tablet Take 20 mg by mouth daily.     . finasteride (PROSCAR) 5 MG tablet TAKE ONE TABLET BY MOUTH ONCE DAILY 90 tablet 1  . fish oil-omega-3 fatty acids 1000 MG capsule Take 1 g by mouth daily.     . Glucosamine Sulfate-MSM (GLUCOSAMINE-MSM DS) 500-500 MG TABS Take 1,500 mg by mouth 2 (two) times daily.     Marland Kitchen glucose blood test strip Use as instructed to test blood sugar once daily dx: 250.00    . ibuprofen (ADVIL,MOTRIN) 200 MG tablet Take 400 mg by mouth 2 (two) times daily.    Marland Kitchen loratadine (CLARITIN) 10 MG tablet Take 10 mg by mouth daily.      .  metFORMIN (GLUCOPHAGE) 500 MG tablet Take 1 tablet (500 mg total) by mouth 2 (two) times daily with a meal. 180 tablet 3  . MICROLET LANCETS MISC Use to test twice daily or as directed 100 each 3  . Multiple Vitamins-Minerals (CENTRUM SILVER PO) Take 1 tablet by mouth daily after breakfast.     . nitroGLYCERIN (NITROSTAT) 0.4 MG SL tablet Place 0.4 mg under the tongue every 5 (five) minutes as needed for chest pain.    . tamsulosin (FLOMAX) 0.4 MG CAPS capsule TAKE ONE CAPSULE BY MOUTH ONCE DAILY 90 capsule 1  . triamterene-hydrochlorothiazide (DYAZIDE) 37.5-25 MG capsule TAKE ONE CAPSULE BY MOUTH IN THE MORNING 90 capsule 3  . warfarin (COUMADIN) 5 MG tablet TAKE ONE TO ONE & ONE-HALF TABLETS BY MOUTH ONCE DAILY AS DIRECTED 135 tablet 1   No current facility-administered medications on file prior to visit.    Allergies  Allergen Reactions  . Enoxaparin Sodium Hives  . Latex Rash  . Nickel Rash  . Percocet [Oxycodone-Acetaminophen] Rash    Past Medical History  Diagnosis Date  . Chronic prostatitis   . Obstructive sleep apnea     CPAP-9  . Chronic ear infection   . Asthma   . GERD (gastroesophageal reflux disease)     laryngeal involvement  . Hypertension   .  Osteoarthrosis, localized, primary, knee     post-traumatic  . Diabetes mellitus   . Tachy-brady syndrome ([place])   . Atrial flutter ([place]) 2007  . Benign prostatic hypertrophy   . Hyperlipidemia   . Band keratopathy   . UTI (urinary tract infection)   . Prostatitis   . Chronic kidney disease   . Stroke [facility])     TIA    Past Surgical History  Procedure Laterality Date  . Mastoidectomy  8/08    Dr Idelle Crouch  . Rhinoplasty  5/10    and septoplasty  . Cataract extraction, bilateral  2009  . Belpharoptosis repair      Dr [name]---didn't resolve weepy eye and eyelid drooping  . Knee surgery  1998    plate after fracture, then removed for infection  . Cardioversion  04/13/2010  . Chest pain  8/12    Stress test  benign  . Appendectomy    . Left elbow surgery    . Venous ablation    . Tear duct probing  11/13    Dr Vickki Muff  . Subacromial decompression Right 2005    Arthroscopic (for rotator cuff and biceps tendon ruptures)  . Eye surgery    . Fracture surgery Left     elbow  . Total knee arthroplasty Left 09/01/2014    Procedure: TOTAL KNEE ARTHROPLASTY;  Surgeon: Dereck Leep, MD;  Location: [facility] ORS;  Service: Orthopedics;  Laterality: Left;  . Trigger finger release Left 02/25/2015    Procedure: LEFT LONG TRIGGER RELEASE;  Surgeon: Dereck Leep, MD;  Location: [facility] ORS;  Service: Orthopedics;  Laterality: Left;    Family History  Problem Relation Age of Onset  . Other Mother     natural causes  . Colon cancer Father   . Diabetes Father   . Heart attack Neg Hx   . Stroke Neg Hx   . Hypertension Father     Social History   Social History  . Marital Status: Married    Spouse Name: N/A  . Number of Children: 2  . 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 Smoking status: Former Smoker -- 1.00 packs/day for 0 years    Types: Cigarettes    Quit date: 01/03/1969  . Smokeless tobacco: Never Used  . Alcohol Use: 0.6 oz/week    1 Glasses of wine per week     Comment: rare wine  . Drug Use: No  . Sexual Activity: Not on file   Other Topics Concern  . Not on file   Social History Narrative   Has living will   Health care POA-- wife and then son Michael Colon   Has DNR order from the past---form redone 06/25/10   Probably no feeding tube if cognitively unaware   Review of Systems  Appetite is fine Weight fairly stable Not sleeping great either---waking every 2 hours No other abnormal bleeding    Objective:   Physical Exam  Constitutional: He appears well-developed. No distress.  HENT:  Several bleeding spots in left canal--including fairly deep TM is normal  Neck: Normal range of motion. Neck supple.    Cardiovascular: Normal rate, normal heart sounds and intact distal pulses.  Exam reveals no gallop.   No murmur heard. Pulmonary/Chest: Effort normal and breath sounds normal. No respiratory distress. He has no wheezes. He has no rales.  Musculoskeletal:  Trace pedal and calf swelling  Lymphadenopathy:    He has no cervical adenopathy.  Neurological:  Mildly decreased sensation in plantar feet  Skin:  Superficial injury left calf is granulating---- no infection. No foot lesions  Psychiatric: He has a normal mood and affect. His behavior is normal.          Assessment & Plan:

## 2015-06-25 NOTE — Assessment & Plan Note (Signed)
No symptoms or CHF Follows with Dr Rockey Situ and on the coumadin

## 2015-06-26 DIAGNOSIS — H60332 Swimmer's ear, left ear: Secondary | ICD-10-CM | POA: Diagnosis not present

## 2015-06-26 DIAGNOSIS — H6063 Unspecified chronic otitis externa, bilateral: Secondary | ICD-10-CM | POA: Diagnosis not present

## 2015-06-26 LAB — MICROALBUMIN / CREATININE URINE RATIO
Creatinine,U: 108.9 mg/dL
Microalb Creat Ratio: 2.1 mg/g (ref 0.0–30.0)
Microalb, Ur: 2.3 mg/dL — ABNORMAL HIGH (ref 0.0–1.9)

## 2015-06-29 ENCOUNTER — Other Ambulatory Visit: Payer: Self-pay | Admitting: Cardiovascular Disease

## 2015-06-30 NOTE — Telephone Encounter (Signed)
Review for refill, Thank you. 

## 2015-07-03 DIAGNOSIS — H60532 Acute contact otitis externa, left ear: Secondary | ICD-10-CM | POA: Diagnosis not present

## 2015-07-15 ENCOUNTER — Ambulatory Visit (INDEPENDENT_AMBULATORY_CARE_PROVIDER_SITE_OTHER): Payer: Medicare Other | Admitting: *Deleted

## 2015-07-15 DIAGNOSIS — Z5181 Encounter for therapeutic drug level monitoring: Secondary | ICD-10-CM

## 2015-07-15 DIAGNOSIS — I4892 Unspecified atrial flutter: Secondary | ICD-10-CM

## 2015-07-15 DIAGNOSIS — Z7901 Long term (current) use of anticoagulants: Secondary | ICD-10-CM | POA: Diagnosis not present

## 2015-07-15 LAB — POCT INR: INR: 2.4

## 2015-07-22 ENCOUNTER — Telehealth: Payer: Self-pay

## 2015-07-22 NOTE — Telephone Encounter (Signed)
okay

## 2015-07-22 NOTE — Telephone Encounter (Signed)
I spoke with pt and he feels better this morning;pt slept all night and temp this morning is 96. Pt will cb if needed.

## 2015-07-22 NOTE — Telephone Encounter (Signed)
PLEASE NOTE: All timestamps contained within this report are represented as [place] Standard Time. CONFIDENTIALTY NOTICE: This fax transmission is intended only for the addressee. It contains information that is legally privileged, confidential or otherwise protected from use or disclosure. If you are not the intended recipient, you are strictly prohibited from reviewing, disclosing, copying using or disseminating any of this information or taking any action in reliance on or regarding this information. If you have received this fax in error, please notify us immediately by telephone so that we can arrange for its return to [place]. Phone: [phone], Toll-Free: [phone], Fax: [phone] Page: 1 of 2 Call Id: [id number] [employer] Patient Name: Michael Colon Gender: Male DOB: April 24, 1930 Age: 82 Y 46 M 8 D Return Phone Number: [phone] (Primary) Address: City/State/Zip: [place] Client [place] Night - Client Client Site [place] Physician [name] - MD Contact Type Call Who Is Calling Patient / Member / Family / Caregiver Call Type Triage / Clinical Caller Name Eliab Magill Relationship To Patient Spouse Return Phone Number [phone] (Primary) Chief Complaint Fever (non urgent symptom) (> THREE MONTHS) Reason for Call Symptomatic / Request for Sharon States husband sleeping all day, temp 99.9 , feels bad all day PreDisposition Call Doctor Translation No Nurse Assessment Nurse: Amalia Hailey, RN, Melissa Date/Time (Eastern Time): 07/21/2015 7:31:41 PM Confirm and document reason for call. If symptomatic, describe symptoms. You must click the next button to save text entered. ---Caller States husband sleeping all day, temp 99.9 , feels bad all day Has the patient traveled out of the country  within the last 30 days? ---Not Applicable Does the patient have any new or worsening symptoms? ---Yes Will a triage be completed? ---Yes Related visit to physician within the last 2 weeks? ---No Does the PT have any chronic conditions? (i.e. diabetes, asthma, etc.) ---Yes List chronic conditions. ---Diabetes-Metformin, hypertension, sleep apnea, CPAP machine, high cholesterol Is this a behavioral health or substance abuse call? ---No Guidelines Guideline Title Affirmed Question Affirmed Notes Nurse Date/Time (Eastern Time) Fever [1] Fever AND [2] no signs of serious infection or localizing symptoms (all other triage questions negative) Amalia Hailey, RN, Melissa 07/21/2015 7:34:05 PM Disp. Time Eilene Ghazi Time) Disposition Final User PLEASE NOTE: All timestamps contained within this report are represented as [place] Standard Time. CONFIDENTIALTY NOTICE: This fax transmission is intended only for the addressee. It contains information that is legally privileged, confidential or otherwise protected from use or disclosure. If you are not the intended recipient, you are strictly prohibited from reviewing, disclosing, copying using or disseminating any of this information or taking any action in reliance on or regarding this information. If you have received this fax in error, please notify us immediately by telephone so that we can arrange for its return to [place]. Phone: [phone], Toll-Free: [phone], Fax: [phone] Page: 2 of 2 Call Id: [id number] 07/21/2015 7:42:16 PM Home Care Yes Amalia Hailey, RN, Lenna Sciara Caller Understands: Yes Disagree/Comply: Comply Care Advice Given Per Guideline REASSURANCE: The presence of a fever usually means that you have an infection. Most fevers are not serious and may actually help the body fight infection. The goal of fever therapy is to bring the fever down to a comfortable level. Use the following definitions to help put the level of fever into proper  perspective: HOME CARE: You should be able to treat this at home. FOR  ALL FEVERS: * Drink cold fluids to prevent dehydration. * 100-102 F (37.8 - 38.9 C): Low-grade fevers and may help body fight infection. * You become worse. CALL BACK IF: * Fever goes above 104 F (40 C) and you are unable to get it down * Fever lasts over 3 days (72 hours) EXPECTED COURSE OF FEVER: Most fevers associated with viral illnesses fluctuate between 101 - 103 F (38.3 - 39.5 C) and last for 2 or 3 days. * Dress in 1 layer of lightweight clothing and sleep with 1 light blanket. FEVER MEDICINES: * For fevers less than 101 F (38.3 C), fever medicines are usually not necessary.

## 2015-07-23 ENCOUNTER — Encounter: Payer: Self-pay | Admitting: Internal Medicine

## 2015-07-23 ENCOUNTER — Ambulatory Visit (INDEPENDENT_AMBULATORY_CARE_PROVIDER_SITE_OTHER): Payer: Medicare Other | Admitting: Internal Medicine

## 2015-07-23 ENCOUNTER — Telehealth: Payer: Self-pay

## 2015-07-23 ENCOUNTER — Institutional Professional Consult (permissible substitution): Payer: Medicare Other | Admitting: Internal Medicine

## 2015-07-23 VITALS — BP 112/76 | HR 64 | Temp 97.3°F | Wt 235.0 lb

## 2015-07-23 DIAGNOSIS — N410 Acute prostatitis: Secondary | ICD-10-CM | POA: Diagnosis not present

## 2015-07-23 DIAGNOSIS — I2510 Atherosclerotic heart disease of native coronary artery without angina pectoris: Secondary | ICD-10-CM | POA: Diagnosis not present

## 2015-07-23 LAB — POC URINALSYSI DIPSTICK (AUTOMATED)
Bilirubin, UA: NEGATIVE
Ketones, UA: NEGATIVE
Nitrite, UA: POSITIVE
Protein, UA: 0.15
Spec Grav, UA: >=1.03
Urobilinogen, UA: >=8
pH, UA: 6

## 2015-07-23 MED ORDER — CEFTRIAXONE SODIUM 1 G IJ SOLR
1.0000 g | Freq: Once | INTRAMUSCULAR | Status: AC
  Administered 2015-07-23: 1 g via INTRAMUSCULAR

## 2015-07-23 MED ORDER — CIPROFLOXACIN HCL 250 MG PO TABS: 250.0000 mg | ORAL_TABLET | Freq: Two times a day (BID) | ORAL | Status: DC

## 2015-07-23 NOTE — Progress Notes (Signed)
Subjective:    Patient ID: Michael Colon, male    DOB: 1930/08/05, 82 y.o.   MRN: [id number]  HPI Here due to urinary symptoms With wife  Started with a sleepy day 2 days ago Spiked temp over 100 both nights Slightly better yesterday--but worse today Seems like his prostate problems  Has increased urine sediment and bad smell Some dysuria Urgency--goes very little--then increased frequency No hematuria but some "orange" in bottom of bowl  Has had sweats and chills at night  Takes ibuprofen bid usually No extra meds  Current Outpatient Prescriptions on File Prior to Visit  Medication Sig Dispense Refill  . atorvastatin (LIPITOR) 40 MG tablet Take 1 tablet (40 mg total) by mouth daily. 90 tablet 3  . BAYER CONTOUR TEST test strip USE STRIP AS INSTRUCTED TO TEST BLOOD SUGAR TWICE DAILY 100 each 11  . docusate sodium (COLACE) 100 MG capsule Take 100 mg by mouth 2 (two) times daily.     . famotidine (PEPCID) 20 MG tablet Take 20 mg by mouth daily.     . finasteride (PROSCAR) 5 MG tablet TAKE ONE TABLET BY MOUTH ONCE DAILY 90 tablet 1  . fish oil-omega-3 fatty acids 1000 MG capsule Take 1 g by mouth daily.     . Glucosamine Sulfate-MSM (GLUCOSAMINE-MSM DS) 500-500 MG TABS Take 1,500 mg by mouth 2 (two) times daily.     Marland Kitchen glucose blood test strip Use as instructed to test blood sugar once daily dx: 250.00    . ibuprofen (ADVIL,MOTRIN) 200 MG tablet Take 400 mg by mouth 2 (two) times daily.    Marland Kitchen loratadine (CLARITIN) 10 MG tablet Take 10 mg by mouth daily.      . metFORMIN (GLUCOPHAGE) 500 MG tablet Take 1 tablet (500 mg total) by mouth 2 (two) times daily with a meal. 180 tablet 3  . MICROLET LANCETS MISC Use to test twice daily or as directed 100 each 3  . Multiple Vitamins-Minerals (CENTRUM SILVER PO) Take 1 tablet by mouth daily after breakfast.     . nitroGLYCERIN (NITROSTAT) 0.4 MG SL tablet Place 0.4 mg under the tongue every 5 (five) minutes as needed for chest pain.    .  tamsulosin (FLOMAX) 0.4 MG CAPS capsule TAKE ONE CAPSULE BY MOUTH ONCE DAILY 90 capsule 1  . triamterene-hydrochlorothiazide (DYAZIDE) 37.5-25 MG capsule TAKE ONE CAPSULE BY MOUTH IN THE MORNING 90 capsule 3  . warfarin (COUMADIN) 5 MG tablet TAKE ONE TO ONE & ONE-HALF TABLETS BY MOUTH ONCE DAILY AS DIRECTED 135 tablet 1   No current facility-administered medications on file prior to visit.    Allergies  Allergen Reactions  . Enoxaparin Sodium Hives  . Latex Rash  . Nickel Rash  . Percocet [Oxycodone-Acetaminophen] Rash    Past Medical History  Diagnosis Date  . Chronic prostatitis   . Obstructive sleep apnea     CPAP-9  . Chronic ear infection   . Asthma   . GERD (gastroesophageal reflux disease)     laryngeal involvement  . Hypertension   . Osteoarthrosis, localized, primary, knee     post-traumatic  . Diabetes mellitus   . Tachy-brady syndrome ([place])   . Atrial flutter ([place]) 2007  . Benign prostatic hypertrophy   . Hyperlipidemia   . Band keratopathy   . UTI (urinary tract infection)   . Prostatitis   . Chronic kidney disease   . Stroke [facility])     TIA    Past Surgical History  Procedure Laterality Date  . Mastoidectomy  8/08    Dr Idelle Crouch  . Rhinoplasty  5/10    and septoplasty  . Cataract extraction, bilateral  2009  . Belpharoptosis repair      Dr [name]---didn't resolve weepy eye and eyelid drooping  . Knee surgery  1998    plate after fracture, then removed for infection  . Cardioversion  04/13/2010  . Chest pain  8/12    Stress test benign  . Appendectomy    . Left elbow surgery    . Venous ablation    . Tear duct probing  11/13    Dr Vickki Muff  . Subacromial decompression Right 2005    Arthroscopic (for rotator cuff and biceps tendon ruptures)  . Eye surgery    . Fracture surgery Left     elbow  . Total knee arthroplasty Left 09/01/2014    Procedure: TOTAL KNEE ARTHROPLASTY;  Surgeon: Dereck Leep, MD;  Location: [facility] ORS;  Service: Orthopedics;   Laterality: Left;  . Trigger finger release Left 02/25/2015    Procedure: LEFT LONG TRIGGER RELEASE;  Surgeon: Dereck Leep, MD;  Location: [facility] ORS;  Service: Orthopedics;  Laterality: Left;    Family History  Problem Relation Age of Onset  . Other Mother     natural causes  . Colon cancer Father   . Diabetes Father   . Heart attack Neg Hx   . Stroke Neg Hx   . Hypertension Father     Social History   Social History  . Marital Status: Married    Spouse Name: N/A  . Number of Children: 2  . 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 Smoking status: Former Smoker -- 1.00 packs/day for 0 years    Types: Cigarettes    Quit date: 01/03/1969  . Smokeless tobacco: Never Used  . Alcohol Use: 0.6 oz/week    1 Glasses of wine per week     Comment: rare wine  . Drug Use: No  . Sexual Activity: Not on file   Other Topics Concern  . Not on file   Social History Narrative   Has living will   Health care POA-- wife and then son Shanon Brow   Has DNR order from the past---form redone 06/25/10   Probably no feeding tube if cognitively unaware   Review of Systems No N/V No cough or SOB Appetite is off ---but able to eat Still has some orthostatic dizziness     Objective:   Physical Exam  Constitutional: He appears well-developed. No distress.  Neck: Normal range of motion. Neck supple.  Pulmonary/Chest: Effort normal and breath sounds normal. No respiratory distress. He has no wheezes. He has no rales.  Abdominal: Soft. He exhibits no distension. There is no rebound and no guarding.  Genitourinary:  Prostate is firm without nodule Not boggy Moderate tenderness  Musculoskeletal: He exhibits no edema.  Lymphadenopathy:    He has no cervical adenopathy.  Psychiatric: He has a normal mood and affect.          Assessment & Plan:

## 2015-07-23 NOTE — Patient Instructions (Signed)
Please reduce the warfarin to 1 tab daily while on the cipro. You will need a protime done next week to make sure it is okay.

## 2015-07-23 NOTE — Telephone Encounter (Signed)
I spoke with Mrs Caffee and she thinks pt having a prostate problem with lower abd pain and fever. Pt only wants to see Dr Silvio Pate; pt scheduled to see Dr Silvio Pate 07/23/15 at 3:30. (1st available appt with Dr Silvio Pate). If pt condition changes or worsens Ms. Duren will cb or go to ED. FYI to Dr Silvio Pate.

## 2015-07-23 NOTE — Telephone Encounter (Signed)
PLEASE NOTE: All timestamps contained within this report are represented as [place] Standard Time. CONFIDENTIALTY NOTICE: This fax transmission is intended only for the addressee. It contains information that is legally privileged, confidential or otherwise protected from use or disclosure. If you are not the intended recipient, you are strictly prohibited from reviewing, disclosing, copying using or disseminating any of this information or taking any action in reliance on or regarding this information. If you have received this fax in error, please notify us immediately by telephone so that we can arrange for its return to [place]. Phone: [phone], Toll-Free: [phone], Fax: [phone] Page: 1 of 1 Call Id: [id number] [name] - Client Nonclinical Telephone Record [name] - Client Client Site [name] [name] - MD Contact Type Call Who Is Calling Patient / Member / Family / Caregiver Caller Name Michael Colon Phone Number [phone] Patient Name Michael Colon Call Type Message Only Information Provided Reason for Call Request to Schedule Office Appointment Initial Comment Caller states husband's temperature is over 100, having abdominal pain, husband has had an enlarged prostate in the past. Caller states husband needs to be seen as soon as possible. Call Closed By: Efrain Sella Transaction Date/Time: 07/22/2015 7:40:43 PM (ET)

## 2015-07-23 NOTE — Addendum Note (Signed)
Addended by: Pilar Grammes on: 07/23/2015 05:01 PM   Modules accepted: Orders

## 2015-07-23 NOTE — Telephone Encounter (Signed)
Will see him then 

## 2015-07-23 NOTE — Assessment & Plan Note (Signed)
Has had fever and systemic symptoms--though not sepsis now Will give rocephin now cipro 250mg  bid for 3 weeks Has needed even longer Rx in the past

## 2015-07-23 NOTE — Progress Notes (Signed)
Pre visit review using our clinic review tool, if applicable. No additional management support is needed unless otherwise documented below in the visit note. 

## 2015-07-24 ENCOUNTER — Telehealth: Payer: Self-pay | Admitting: Cardiovascular Disease

## 2015-07-24 NOTE — Telephone Encounter (Signed)
Pt started on cipro 250mg  BID for 3 weeks. Usual Coumadin dose 1.5 tabs daily (7.5mg ). Dr. Silvio Pate reduced dose to 1 tab daily (5mg ) while on cipro. Pt has previously been on cipro 250mg  BID for a few weeks in 2012 + was on same Coumadin dose and his INR never increased.  Spoke with pt's wife - he already took lower dose 1 tablet yesterday and today, advised him to resume his usual dose 1.5 tabs daily starting tomorrow 7/22 and keep INR check in [place] next Wednesday. She verbalized understanding.

## 2015-07-24 NOTE — Telephone Encounter (Signed)
Pt wife called, states Dr. Silvio Pate reduced pt Coumadin doseage.  Pt did schedule for coumadin check on 7/26

## 2015-07-29 ENCOUNTER — Ambulatory Visit (INDEPENDENT_AMBULATORY_CARE_PROVIDER_SITE_OTHER): Payer: Medicare Other

## 2015-07-29 DIAGNOSIS — I4892 Unspecified atrial flutter: Secondary | ICD-10-CM | POA: Diagnosis not present

## 2015-07-29 DIAGNOSIS — Z7901 Long term (current) use of anticoagulants: Secondary | ICD-10-CM | POA: Diagnosis not present

## 2015-07-29 DIAGNOSIS — Z5181 Encounter for therapeutic drug level monitoring: Secondary | ICD-10-CM

## 2015-07-29 LAB — POCT INR: INR: 2.3

## 2015-08-20 ENCOUNTER — Ambulatory Visit (INDEPENDENT_AMBULATORY_CARE_PROVIDER_SITE_OTHER): Payer: Medicare Other | Admitting: Internal Medicine

## 2015-08-20 ENCOUNTER — Encounter: Payer: Self-pay | Admitting: Internal Medicine

## 2015-08-20 VITALS — BP 128/78 | HR 63 | Ht 71.0 in | Wt 239.0 lb

## 2015-08-20 DIAGNOSIS — R0600 Dyspnea, unspecified: Secondary | ICD-10-CM | POA: Diagnosis not present

## 2015-08-20 DIAGNOSIS — I2510 Atherosclerotic heart disease of native coronary artery without angina pectoris: Secondary | ICD-10-CM

## 2015-08-20 DIAGNOSIS — I4892 Unspecified atrial flutter: Secondary | ICD-10-CM

## 2015-08-20 DIAGNOSIS — G4733 Obstructive sleep apnea (adult) (pediatric): Secondary | ICD-10-CM

## 2015-08-20 NOTE — Addendum Note (Signed)
Addended by: Oscar La R on: 08/20/2015 01:52 PM   Modules accepted: Orders

## 2015-08-20 NOTE — Patient Instructions (Signed)
1.repeat Sleep Titration study 2.check ONO 3.check PFT 4.check 6MWT 5.will order new machine and nasal mask   Sleep Apnea  Sleep apnea is a sleep disorder characterized by abnormal pauses in breathing while you sleep. When your breathing pauses, the level of oxygen in your blood decreases. This causes you to move out of deep sleep and into light sleep. As a result, your quality of sleep is poor, and the system that carries your blood throughout your body (cardiovascular system) experiences stress. If sleep apnea remains untreated, the following conditions can develop:  High blood pressure (hypertension).  Coronary artery disease.  Inability to achieve or maintain an erection (impotence).  Impairment of your thought process (cognitive dysfunction). There are three types of sleep apnea: 1. Obstructive sleep apnea--Pauses in breathing during sleep because of a blocked airway. 2. Central sleep apnea--Pauses in breathing during sleep because the area of the brain that controls your breathing does not send the correct signals to the muscles that control breathing. 3. Mixed sleep apnea--A combination of both obstructive and central sleep apnea. RISK FACTORS The following risk factors can increase your risk of developing sleep apnea:  Being overweight.  Smoking.  Having narrow passages in your nose and throat.  Being of older age.  Being male.  Alcohol use.  Sedative and tranquilizer use.  Ethnicity. Among individuals younger than 35 years, African Americans are at increased risk of sleep apnea. SYMPTOMS   Difficulty staying asleep.  Daytime sleepiness and fatigue.  Loss of energy.  Irritability.  Loud, heavy snoring.  Morning headaches.  Trouble concentrating.  Forgetfulness.  Decreased interest in sex.  Unexplained sleepiness. DIAGNOSIS  In order to diagnose sleep apnea, your caregiver will perform a physical examination. A sleep study done in the comfort of your  own home may be appropriate if you are otherwise healthy. Your caregiver may also recommend that you spend the night in a sleep lab. In the sleep lab, several monitors record information about your heart, lungs, and brain while you sleep. Your leg and arm movements and blood oxygen level are also recorded. TREATMENT The following actions may help to resolve mild sleep apnea:  Sleeping on your side.   Using a decongestant if you have nasal congestion.   Avoiding the use of depressants, including alcohol, sedatives, and narcotics.   Losing weight and modifying your diet if you are overweight. There also are devices and treatments to help open your airway:  Oral appliances. These are custom-made mouthpieces that shift your lower jaw forward and slightly open your bite. This opens your airway.  Devices that create positive airway pressure. This positive pressure "splints" your airway open to help you breathe better during sleep. The following devices create positive airway pressure:  Continuous positive airway pressure (CPAP) device. The CPAP device creates a continuous level of air pressure with an air pump. The air is delivered to your airway through a mask while you sleep. This continuous pressure keeps your airway open.  Nasal expiratory positive airway pressure (EPAP) device. The EPAP device creates positive air pressure as you exhale. The device consists of single-use valves, which are inserted into each nostril and held in place by adhesive. The valves create very little resistance when you inhale but create much more resistance when you exhale. That increased resistance creates the positive airway pressure. This positive pressure while you exhale keeps your airway open, making it easier to breath when you inhale again.  Bilevel positive airway pressure (BPAP) device. The BPAP  device is used mainly in patients with central sleep apnea. This device is similar to the CPAP device because it  also uses an air pump to deliver continuous air pressure through a mask. However, with the BPAP machine, the pressure is set at two different levels. The pressure when you exhale is lower than the pressure when you inhale.  Surgery. Typically, surgery is only done if you cannot comply with less invasive treatments or if the less invasive treatments do not improve your condition. Surgery involves removing excess tissue in your airway to create a wider passage way.   This information is not intended to replace advice given to you by your health care provider. Make sure you discuss any questions you have with your health care provider.   Document Released: 12/10/2001 Document Revised: 01/10/2014 Document Reviewed: 04/28/2011 Elsevier Interactive Patient Education [employer].

## 2015-08-20 NOTE — Progress Notes (Signed)
[facility] Consultation      Date: 08/20/2015,   MRN# [id number] Michael Colon [birth year] Code Status:  Code Status History    Date Active Date Inactive Code Status Order ID Comments User Context   09/01/2014  1:28 PM 09/04/2014  6:52 PM Full Code [id number]  Dereck Leep, MD Inpatient     Hosp day:@LENGTHOFSTAYDAYS @ Referring MD: @ATDPROV @     PCP:      AdmissionWeight: 239 lb (108.4 kg)                 CurrentWeight: 239 lb (108.4 kg) Michael Colon is a 82 y.o. old male seen in consultation for sleep apnea at the request of Dr. Silvio Pate.     CHIEF COMPLAINT:   Problems sleeping at night, SOB at night   HISTORY OF PRESENT ILLNESS   82 yo white male seen today for problems with SOB at night on occasion This symptoms have been going on for several months, associated with feeling  like he needs oxygen to breathe Patient dx with OSA in 2006, placed on CPAP 9CM h20 for past 11 years Patient has not had any re-assessment since dx Patient feels non-refreshed sleep at times, occasional fatigue and takes frequent naps during day because of fatigue(3-4 naps per day-does NOT use CPAP) Patient is a former smoker quit 1971, 1 ppd intermittently for 10 years He is retired [employer] park manager, loves doing handy work  patient feels like he needs re-assessment of mask and pressures   PAST MEDICAL HISTORY   Past Medical History:  Diagnosis Date  . Asthma   . Atrial flutter ([place]) 2007  . Band keratopathy   . Benign prostatic hypertrophy   . Chronic ear infection   . Chronic kidney disease   . Chronic prostatitis   . Diabetes mellitus   . GERD (gastroesophageal reflux disease)    laryngeal involvement  . Hyperlipidemia   . Hypertension   . Obstructive sleep apnea    CPAP-9  . Osteoarthrosis, localized, primary, knee    post-traumatic  . Prostatitis   . Stroke [facility])    TIA  . Tachy-brady syndrome ([place])   . UTI (urinary tract infection)      SURGICAL HISTORY    Past Surgical History:  Procedure Laterality Date  . APPENDECTOMY    . BELPHAROPTOSIS REPAIR     Dr [name]---didn't resolve weepy eye and eyelid drooping  . CARDIOVERSION  04/13/2010  . CATARACT EXTRACTION, BILATERAL  2009  . Chest pain  8/12   Stress test benign  . EYE SURGERY    . FRACTURE SURGERY Left    elbow  . KNEE SURGERY  1998   plate after fracture, then removed for infection  . left elbow surgery    . MASTOIDECTOMY  8/08   Dr Idelle Crouch  . RHINOPLASTY  5/10   and septoplasty  . SUBACROMIAL DECOMPRESSION Right 2005   Arthroscopic (for rotator cuff and biceps tendon ruptures)  . TEAR DUCT PROBING  11/13   Dr Vickki Muff  . TOTAL KNEE ARTHROPLASTY Left 09/01/2014   Procedure: TOTAL KNEE ARTHROPLASTY;  Surgeon: Dereck Leep, MD;  Location: [facility] ORS;  Service: Orthopedics;  Laterality: Left;  . TRIGGER FINGER RELEASE Left 02/25/2015   Procedure: LEFT LONG TRIGGER RELEASE;  Surgeon: Dereck Leep, MD;  Location: [facility] ORS;  Service: Orthopedics;  Laterality: Left;  Marland Kitchen VENOUS ABLATION       FAMILY HISTORY   Family History  Problem Relation Age of  Onset  . Colon cancer Father   . Diabetes Father   . Hypertension Father   . Other Mother     natural causes  . Heart attack Neg Hx   . Stroke Neg Hx      SOCIAL HISTORY   Social History  Substance Use Topics  . Smoking status: Former Smoker    Packs/day: 1.00    Years: 0.00    Types: Cigarettes    Quit date: 01/03/1969  . Smokeless tobacco: Never Used  . Alcohol use 0.6 oz/week    1 Glasses of wine per week     Comment: rare wine     MEDICATIONS    Home Medication:  Current Outpatient Rx  . Order #: [id number] Class: Normal  . Order #: [id number] Class: Normal  . Order #: [id number] Class: Historical Med  . Order #: [id number] Class: Historical Med  . Order #: [id number] Class: Normal  . Order #: [id number] Class: Historical Med  . Order #: [id number] Class: Historical Med  . Order #: [id number] Class: Historical Med  .  Order #: [id number] Class: Historical Med  . Order #: [id number] Class: Historical Med  . Order #: [id number] Class: Normal  . Order #: [id number] Class: Normal  . Order #: [id number] Class: Historical Med  . Order #: [id number] Class: Historical Med  . Order #: [id number] Class: Normal  . Order #: [id number] Class: Normal  . Order #: [id number] Class: Normal    Current Medication:  Current Outpatient Prescriptions:  .  atorvastatin (LIPITOR) 40 MG tablet, Take 1 tablet (40 mg total) by mouth daily., Disp: 90 tablet, Rfl: 3 .  BAYER CONTOUR TEST test strip, USE STRIP AS INSTRUCTED TO TEST BLOOD SUGAR TWICE DAILY, Disp: 100 each, Rfl: 11 .  docusate sodium (COLACE) 100 MG capsule, Take 100 mg by mouth 2 (two) times daily. , Disp: , Rfl:  .  famotidine (PEPCID) 20 MG tablet, Take 20 mg by mouth daily. , Disp: , Rfl:  .  finasteride (PROSCAR) 5 MG tablet, TAKE ONE TABLET BY MOUTH ONCE DAILY, Disp: 90 tablet, Rfl: 1 .  fish oil-omega-3 fatty acids 1000 MG capsule, Take 1 g by mouth daily. , Disp: , Rfl:  .  Glucosamine Sulfate-MSM (GLUCOSAMINE-MSM DS) 500-500 MG TABS, Take 1,500 mg by mouth 2 (two) times daily. , Disp: , Rfl:  .  glucose blood test strip, Use as instructed to test blood sugar once daily dx: 250.00, Disp: , Rfl:  .  ibuprofen (ADVIL,MOTRIN) 200 MG tablet, Take 400 mg by mouth 2 (two) times daily., Disp: , Rfl:  .  loratadine (CLARITIN) 10 MG tablet, Take 10 mg by mouth daily.  , Disp: , Rfl:  .  metFORMIN (GLUCOPHAGE) 500 MG tablet, Take 1 tablet (500 mg total) by mouth 2 (two) times daily with a meal., Disp: 180 tablet, Rfl: 3 .  MICROLET LANCETS MISC, Use to test twice daily or as directed, Disp: 100 each, Rfl: 3 .  Multiple Vitamins-Minerals (CENTRUM SILVER PO), Take 1 tablet by mouth daily after breakfast. , Disp: , Rfl:  .  nitroGLYCERIN (NITROSTAT) 0.4 MG SL tablet, Place 0.4 mg under the tongue every 5 (five) minutes as needed for chest pain., Disp: , Rfl:  .  tamsulosin (FLOMAX) 0.4 MG CAPS  capsule, TAKE ONE CAPSULE BY MOUTH ONCE DAILY, Disp: 90 capsule, Rfl: 1 .  triamterene-hydrochlorothiazide (DYAZIDE) 37.5-25 MG capsule, TAKE ONE CAPSULE BY MOUTH IN THE MORNING, Disp: 90 capsule, Rfl: 3 .  warfarin (COUMADIN) 5 MG tablet, TAKE ONE TO ONE & ONE-HALF TABLETS BY MOUTH ONCE  DAILY AS DIRECTED, Disp: 135 tablet, Rfl: 1    ALLERGIES   Enoxaparin sodium; Latex; Nickel; and Percocet [oxycodone-acetaminophen]     REVIEW OF SYSTEMS   Review of Systems  Constitutional: Positive for malaise/fatigue. Negative for chills, diaphoresis, fever and weight loss.  HENT: Negative for congestion and hearing loss.   Eyes: Negative for blurred vision and double vision.  Respiratory: Positive for shortness of breath. Negative for cough, hemoptysis, sputum production and wheezing.   Cardiovascular: Negative for chest pain, palpitations and orthopnea.  Gastrointestinal: Negative for abdominal pain, heartburn, nausea and vomiting.  Genitourinary: Negative for dysuria and urgency.  Musculoskeletal: Negative for back pain, myalgias and neck pain.  Skin: Negative for rash.  Neurological: Negative for dizziness, tingling, tremors, weakness and headaches.  Endo/Heme/Allergies: Does not bruise/bleed easily.  Psychiatric/Behavioral: Negative for depression, substance abuse and suicidal ideas.  All other systems reviewed and are negative.    VS: BP 128/78 (BP Location: Right Arm, Cuff Size: Normal)   Pulse 63   Ht 5\' 11"  (1.803 m)   Wt 239 lb (108.4 kg)   SpO2 100%   BMI 33.33 kg/m      PHYSICAL EXAM  Physical Exam  Constitutional: He is oriented to person, place, and time. He appears well-developed and well-nourished. No distress.  HENT:  Head: Normocephalic and atraumatic.  Mouth/Throat: No oropharyngeal exudate.  Eyes: EOM are normal. Pupils are equal, round, and reactive to light. No scleral icterus.  Neck: Normal range of motion. Neck supple.  Cardiovascular: Normal rate, regular  rhythm and normal heart sounds.   No murmur heard. Pulmonary/Chest: No stridor. No respiratory distress. He has no wheezes.  Abdominal: Soft. Bowel sounds are normal.  Musculoskeletal: Normal range of motion. He exhibits edema.  Neurological: He is alert and oriented to person, place, and time. No cranial nerve deficit.  Skin: Skin is warm. He is not diaphoretic.  Psychiatric: He has a normal mood and affect.   CT cardiac scoring lung images reviewed 08/20/2015 L[facility] 3 MM nodule noted   CT ch    ASSESSMENT/PLAN   82 yo white male with long standing sleep apnea with CPAP 9 Cm h2o for past 11 years presents with complaints  of intermittent SOB at night, patient is previous smoker  1.repeat CPAP titration, patient wants nasal mask 2.will need to check ONO 3.check PFT's 4.check 6 MWT 5.incidental 3 MM nodule-follow up in 1 year with CT chest   I have personally obtained a history, examined the patient, evaluated laboratory and independently reviewed imaging results, formulated the assessment and plan and placed orders.  The Patient requires high complexity decision making for assessment and support, frequent evaluation and titration of therapies, application of advanced monitoring technologies and extensive interpretation of multiple databases.    Patient/Family are satisfied with Plan of action and management. All questions answered  Corrin Parker, M.D.  [facility]  Medical Director [place] Director [facility]

## 2015-08-26 ENCOUNTER — Ambulatory Visit (INDEPENDENT_AMBULATORY_CARE_PROVIDER_SITE_OTHER): Payer: Medicare Other

## 2015-08-26 DIAGNOSIS — Z7901 Long term (current) use of anticoagulants: Secondary | ICD-10-CM | POA: Diagnosis not present

## 2015-08-26 DIAGNOSIS — I4892 Unspecified atrial flutter: Secondary | ICD-10-CM

## 2015-08-26 DIAGNOSIS — Z5181 Encounter for therapeutic drug level monitoring: Secondary | ICD-10-CM | POA: Diagnosis not present

## 2015-08-26 LAB — POCT INR: INR: 3

## 2015-08-27 ENCOUNTER — Encounter: Payer: Self-pay | Admitting: Internal Medicine

## 2015-08-31 DIAGNOSIS — G4733 Obstructive sleep apnea (adult) (pediatric): Secondary | ICD-10-CM | POA: Diagnosis not present

## 2015-09-01 ENCOUNTER — Ambulatory Visit: Payer: Medicare Other | Attending: Pulmonary Disease

## 2015-09-01 DIAGNOSIS — I495 Sick sinus syndrome: Secondary | ICD-10-CM | POA: Insufficient documentation

## 2015-09-01 DIAGNOSIS — G4733 Obstructive sleep apnea (adult) (pediatric): Secondary | ICD-10-CM | POA: Diagnosis not present

## 2015-09-02 ENCOUNTER — Telehealth: Payer: Self-pay

## 2015-09-02 NOTE — Telephone Encounter (Signed)
Pt aware of negative ONO results. Pt voiced understanding. Nothing further needed.

## 2015-09-03 DIAGNOSIS — G4733 Obstructive sleep apnea (adult) (pediatric): Secondary | ICD-10-CM | POA: Diagnosis not present

## 2015-09-04 ENCOUNTER — Telehealth: Payer: Self-pay | Admitting: *Deleted

## 2015-09-04 DIAGNOSIS — G4733 Obstructive sleep apnea (adult) (pediatric): Secondary | ICD-10-CM

## 2015-09-04 NOTE — Telephone Encounter (Signed)
With wife and gave results of titration study and informed her order will be placed for CPAP.  Per DR, after looking at results states to put CPAP on auto of 8-20. Order placed.

## 2015-09-07 ENCOUNTER — Other Ambulatory Visit: Payer: Self-pay | Admitting: Internal Medicine

## 2015-09-08 ENCOUNTER — Telehealth: Payer: Self-pay

## 2015-09-08 NOTE — Telephone Encounter (Signed)
Spoke with pt wife who states she spoke with [facility] who states they have not received a fax from our office unless we had sent it to the [place] office. I advised pt wife that we had faxed the order over to [place] at 2:18 on 09/08/15 to the [facility] office. Successful fax notification has been received. Pt wife states she will give [facility] a call back and will contact us if they still have not received it. Nothing further needed.

## 2015-09-14 DIAGNOSIS — E1142 Type 2 diabetes mellitus with diabetic polyneuropathy: Secondary | ICD-10-CM | POA: Diagnosis not present

## 2015-09-14 DIAGNOSIS — B351 Tinea unguium: Secondary | ICD-10-CM | POA: Diagnosis not present

## 2015-09-14 DIAGNOSIS — L851 Acquired keratosis [keratoderma] palmaris et plantaris: Secondary | ICD-10-CM | POA: Diagnosis not present

## 2015-09-15 ENCOUNTER — Other Ambulatory Visit: Payer: Self-pay | Admitting: Internal Medicine

## 2015-09-19 DIAGNOSIS — Z23 Encounter for immunization: Secondary | ICD-10-CM | POA: Diagnosis not present

## 2015-09-23 ENCOUNTER — Ambulatory Visit (INDEPENDENT_AMBULATORY_CARE_PROVIDER_SITE_OTHER): Payer: Medicare Other | Admitting: *Deleted

## 2015-09-23 ENCOUNTER — Encounter: Payer: Self-pay | Admitting: Cardiovascular Disease

## 2015-09-23 ENCOUNTER — Ambulatory Visit (INDEPENDENT_AMBULATORY_CARE_PROVIDER_SITE_OTHER): Payer: Medicare Other | Admitting: Cardiovascular Disease

## 2015-09-23 VITALS — BP 132/80 | HR 75 | Ht 71.0 in | Wt 241.0 lb

## 2015-09-23 DIAGNOSIS — E1140 Type 2 diabetes mellitus with diabetic neuropathy, unspecified: Secondary | ICD-10-CM

## 2015-09-23 DIAGNOSIS — R0600 Dyspnea, unspecified: Secondary | ICD-10-CM

## 2015-09-23 DIAGNOSIS — E785 Hyperlipidemia, unspecified: Secondary | ICD-10-CM | POA: Diagnosis not present

## 2015-09-23 DIAGNOSIS — I10 Essential (primary) hypertension: Secondary | ICD-10-CM | POA: Diagnosis not present

## 2015-09-23 DIAGNOSIS — I2510 Atherosclerotic heart disease of native coronary artery without angina pectoris: Secondary | ICD-10-CM

## 2015-09-23 DIAGNOSIS — I4892 Unspecified atrial flutter: Secondary | ICD-10-CM | POA: Diagnosis not present

## 2015-09-23 DIAGNOSIS — J45909 Unspecified asthma, uncomplicated: Secondary | ICD-10-CM

## 2015-09-23 DIAGNOSIS — R9431 Abnormal electrocardiogram [ECG] [EKG]: Secondary | ICD-10-CM

## 2015-09-23 LAB — PULMONARY FUNCTION TEST
DL/VA % pred: 86 %
DL/VA: 4.02 ml/min/mmHg/L
DLCO unc % pred: 95 %
DLCO unc: 32.28 ml/min/mmHg
FEF 25-75 Post: 3.11 L/sec
FEF 25-75 Pre: 1.54 L/sec
FEF2575-%Change-Post: 101 %
FEF2575-%Pred-Post: 161 %
FEF2575-%Pred-Pre: 80 %
FEV1-%Change-Post: 18 %
FEV1-%Pred-Post: 97 %
FEV1-%Pred-Pre: 82 %
FEV1-Post: 2.82 L
FEV1-Pre: 2.37 L
FEV1FVC-%Change-Post: 8 %
FEV1FVC-%Pred-Pre: 100 %
FEV6-%Change-Post: 9 %
FEV6-%Pred-Post: 95 %
FEV6-%Pred-Pre: 86 %
FEV6-Post: 3.62 L
FEV6-Pre: 3.29 L
FEV6FVC-%Pred-Post: 107 %
FEV6FVC-%Pred-Pre: 107 %
FVC-%Change-Post: 9 %
FVC-%Pred-Post: 88 %
FVC-%Pred-Pre: 81 %
FVC-Post: 3.62 L
FVC-Pre: 3.32 L
Post FEV1/FVC ratio: 78 %
Post FEV6/FVC ratio: 100 %
Pre FEV1/FVC ratio: 72 %
Pre FEV6/FVC Ratio: 100 %
RV % pred: 86 %
RV: 2.39 L
TLC % pred: 85 %
TLC: 6.24 L

## 2015-09-23 MED ORDER — ATORVASTATIN CALCIUM 80 MG PO TABS: 80.0000 mg | ORAL_TABLET | Freq: Every day | ORAL | 3 refills | Status: DC

## 2015-09-23 MED ORDER — EZETIMIBE 10 MG PO TABS
10.0000 mg | ORAL_TABLET | Freq: Every day | ORAL | 3 refills | Status: DC
Start: 2015-09-23 — End: 2016-06-29

## 2015-09-23 NOTE — Progress Notes (Signed)
PFT performed today. 

## 2015-09-23 NOTE — Progress Notes (Signed)
SMW performed today. 

## 2015-09-23 NOTE — Progress Notes (Signed)
Cardiology Office Note  Date:  09/23/2015   ID:  CAROLOS HARLAND, DOB Aug 18, 1930, MRN [id number]  PCP:  [name], MD   Chief Complaint  Patient presents with  . OTHER    6 month f/u pt requesting test results mentioned he never was called with results. Meds reviewed verbally with pt.    HPI:  Mr. Segarra is a very pleasant 82 year old gentleman with history of atrial flutter (3/14), previous ablation , on warfarin, hypertension, borderline diabetes, Obstructive sleep apnea who presented to [facility] on August 04, 2010 with chest pain. Symptoms started after eating and it was felt he had GI spasm or hiatal hernia. Mild improvement in symptoms with nitroglycerin and GI cocktail in the emergency room. He is very active at baseline.  He reports having a TIA some time in 2014, workup negative at 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 Chronic leg swelling worse on the left leg than the right secondary to previous knee surgeries Denies any chest pain concerning for angina. Last stress test was 2012 Tolerating warfarin, no complications Lab work reviewed with him showing hemoglobin A1c 7.5, total  cholesterol 214 Wean  Other past medical history Previously evaluated for bradycardia. Pacemaker was not recommended at that time. Previously seen by Dr. Caryl Comes, EP Previous Holter monitor showed normal sinus rhythm with pauses up to 2.86 seconds, bradycardia with heart rates in the 20s to 30s at nighttime, 30s to 50 during the daytime.  Hemoglobin A1c in December 2014 7.5, total cholesterol up from 132 now 200   PMH:   has a past medical history of Asthma; Atrial flutter ([place]) (2007); Band keratopathy; Benign prostatic hypertrophy; Chronic ear infection; Chronic kidney disease; Chronic prostatitis; Diabetes mellitus; GERD (gastroesophageal reflux disease); Hyperlipidemia; Hypertension; Obstructive sleep apnea; Osteoarthrosis, localized, primary, knee; Prostatitis; Stroke ([place]); Tachy-brady syndrome ([place]); and UTI (urinary tract infection).  PSH:    Past Surgical History:  Procedure Laterality Date  . APPENDECTOMY    . BELPHAROPTOSIS REPAIR     Dr [name]---didn't resolve weepy eye and eyelid drooping  . CARDIOVERSION  04/13/2010  . CATARACT EXTRACTION, BILATERAL  2009  . Chest pain  8/12   Stress test benign  . EYE SURGERY    . FRACTURE SURGERY Left    elbow  . KNEE SURGERY  1998   plate after fracture, then removed for infection  . left elbow surgery    . MASTOIDECTOMY  8/08   Dr Idelle Crouch  . RHINOPLASTY  5/10   and septoplasty  . SUBACROMIAL DECOMPRESSION Right 2005   Arthroscopic (for rotator cuff and biceps tendon ruptures)  . TEAR DUCT PROBING  11/13   Dr Vickki Muff  . TOTAL KNEE  ARTHROPLASTY Left 09/01/2014   Procedure: TOTAL KNEE ARTHROPLASTY;  Surgeon: Dereck Leep, MD;  Location: [facility] ORS;  Service: Orthopedics;  Laterality: Left;  . TRIGGER FINGER RELEASE Left 02/25/2015   Procedure: LEFT LONG TRIGGER RELEASE;  Surgeon: Dereck Leep, MD;  Location: [facility] ORS;  Service: Orthopedics;  Laterality: Left;  Marland Kitchen VENOUS ABLATION      Current Outpatient Prescriptions  Medication  Sig Dispense Refill  . atorvastatin (LIPITOR) 80 MG tablet Take 1 tablet (80 mg total) by mouth daily. 90 tablet 3  . BAYER CONTOUR TEST test strip USE STRIP AS INSTRUCTED TO TEST BLOOD SUGAR TWICE DAILY 100 each 11  . docusate sodium (COLACE) 100 MG capsule Take 100 mg by mouth 2 (two) times daily.     Marland Kitchen ezetimibe (ZETIA) 10 MG tablet Take 1 tablet (10 mg total) by mouth daily. 90 tablet 3  . famotidine (PEPCID) 20 MG tablet Take 20 mg by mouth daily.     . finasteride (PROSCAR) 5 MG tablet TAKE ONE TABLET BY MOUTH ONCE DAILY 90 tablet 1  . fish oil-omega-3 fatty acids 1000 MG capsule Take 1 g by mouth daily.     . Glucosamine Sulfate-MSM (GLUCOSAMINE-MSM DS) 500-500 MG TABS Take 1,500 mg by mouth 2 (two) times daily.     Marland Kitchen glucose blood test strip Use as instructed to test blood sugar once daily dx: 250.00    . ibuprofen (ADVIL,MOTRIN) 200 MG tablet Take 400 mg by mouth 2 (two) times daily.    Marland Kitchen loratadine (CLARITIN) 10 MG tablet Take 10 mg by mouth daily.      . metFORMIN (GLUCOPHAGE) 500 MG tablet Take 1 tablet (500 mg total) by mouth 2 (two) times daily with a meal. 180 tablet 3  . MICROLET LANCETS MISC Use to test twice daily or as directed 100 each 3  . Multiple Vitamins-Minerals (CENTRUM SILVER PO) Take 1 tablet by mouth daily after breakfast.     . nitroGLYCERIN (NITROSTAT) 0.4 MG SL tablet Place 0.4 mg under the tongue every 5 (five) minutes as needed for chest pain.    . tamsulosin (FLOMAX) 0.4 MG CAPS capsule TAKE ONE CAPSULE BY MOUTH ONCE DAILY 90 capsule 0  . triamterene-hydrochlorothiazide (DYAZIDE) 37.5-25 MG capsule TAKE ONE CAPSULE BY MOUTH IN THE MORNING 90 capsule 3  . warfarin (COUMADIN) 5 MG tablet TAKE ONE TO ONE & ONE-HALF TABLETS BY MOUTH ONCE DAILY AS DIRECTED 135 tablet 1   No current facility-administered medications for this visit.      Allergies:   Enoxaparin sodium; Latex; Nickel; and Percocet [oxycodone-acetaminophen]   Social History:  The patient  reports  that he quit smoking about 46 years ago. His smoking use included Cigarettes. He smoked 1.00 pack per day for 0.00 years. He has never used smokeless tobacco. He reports that he does not drink alcohol or use drugs.   Family History:   family history includes Colon cancer in his father; Diabetes in his father; Hypertension in his father; Other in his mother.    Review of Systems: Review of Systems  Constitutional: Negative.   Respiratory: Negative.   Cardiovascular: Negative.   Gastrointestinal: Negative.   Musculoskeletal: Negative.   Neurological: Negative.   Psychiatric/Behavioral: Negative.   All other systems reviewed and are negative.    PHYSICAL EXAM: VS:  BP 132/80 (BP Location: Right Arm, Patient Position: Sitting, Cuff Size: Normal)   Pulse 75   Ht 5\' 11"  (1.803 m)   Wt 241 lb (109.3  kg)   BMI 33.61 kg/m  , BMI Body mass index is 33.61 kg/m. GEN: Well nourished, well developed, in no acute distress  HEENT: normal  Neck: no JVD, carotid bruits, or masses Cardiac: RRR; no murmurs, rubs, or gallops,no edema  Respiratory:  clear to auscultation bilaterally, normal work of breathing GI: soft, nontender, nondistended, + BS MS: no deformity or atrophy  Skin: warm and dry, no rash Neuro:  Strength and sensation are intact Psych: euthymic mood, full affect    Recent Labs: 12/23/2014: ALT 13; BUN 28; Creatinine, Ser 0.80; Sodium 139 02/25/2015: Potassium 3.4 06/25/2015: Hemoglobin 15.3; Platelets 128.0 Repeated and verified X2.    Lipid Panel Lab Results  Component Value Date   CHOL 214 (H) 12/23/2014   HDL 54.60 12/23/2014   LDLCALC 130 (H) 12/23/2014   TRIG 149.0 12/23/2014      Wt Readings from Last 3 Encounters:  09/23/15 241 lb (109.3 kg)  08/20/15 239 lb (108.4 kg)  07/23/15 235 lb (106.6 kg)       ASSESSMENT AND PLAN:  Atrial flutter, unspecified type ([place]) - Plan: EKG 12-Lead Abnormal EKG today, no identifiable P waves Unable to exclude atrial  fib/flutter Rate well controlled, on anticoagulation Discussed with him  Coronary artery disease involving native coronary artery of native heart without angina pectoris Long discussion concerning elevated CT coronary calcium score of 1300 Recent negative stress test indicating no severe stenoses He denies any symptoms concerning for angina Recommended aggressive management of his cholesterol and diabetes  Hyperlipidemia LDL above goal, recommended he increase Lipitor up to 80 mg daily, add zetia to achieve total cholesterol less than 150, LDL less than 70  Essential hypertension Blood pressure is well controlled on today's visit. No changes made to the medications.  Type 2 diabetes, controlled, with neuropathy ([place]) Suggested he follow-up with Dr.Letvak  for further discussion of his hemoglobin A1c We have encouraged continued exercise, careful diet management in an effort to lose weight. Recommended goal hemoglobin A1c in the 6 range   Total encounter time more than 25 minutes  Greater than 50% was spent in counseling and coordination of care with the patient  Disposition:   F/U  6 months   Orders Placed This Encounter  Procedures  . EKG 12-Lead     Signed, Esmond Plants, M.D., Ph.D. 09/23/2015  [facility], [place]

## 2015-09-23 NOTE — Patient Instructions (Addendum)
Medication Instructions:   Please increase the atorvastatin up to 80 mg daily  Please start zetia one a day for cholesterol Labwork:  No new labs needed  Testing/Procedures:  No further testing at this time   Follow-Up: It was a pleasure seeing you in the office today. Please call us if you have new issues that need to be addressed before your next appt.  [phone]  Your physician wants you to follow-up in: 12 months.  You will receive a reminder letter in the mail two months in advance. If you don't receive a letter, please call our office to schedule the follow-up appointment.  If you need a refill on your cardiac medications before your next appointment, please call your pharmacy.

## 2015-09-29 ENCOUNTER — Ambulatory Visit (INDEPENDENT_AMBULATORY_CARE_PROVIDER_SITE_OTHER): Payer: Medicare Other | Admitting: Internal Medicine

## 2015-09-29 ENCOUNTER — Encounter: Payer: Self-pay | Admitting: Internal Medicine

## 2015-09-29 VITALS — BP 130/80 | HR 56 | Wt 237.0 lb

## 2015-09-29 DIAGNOSIS — G4733 Obstructive sleep apnea (adult) (pediatric): Secondary | ICD-10-CM | POA: Diagnosis not present

## 2015-09-29 DIAGNOSIS — I2510 Atherosclerotic heart disease of native coronary artery without angina pectoris: Secondary | ICD-10-CM | POA: Diagnosis not present

## 2015-09-29 NOTE — Patient Instructions (Signed)
AutoCPAP 10-16 Cm h2o   Sleep Apnea  Sleep apnea is a sleep disorder characterized by abnormal pauses in breathing while you sleep. When your breathing pauses, the level of oxygen in your blood decreases. This causes you to move out of deep sleep and into light sleep. As a result, your quality of sleep is poor, and the system that carries your blood throughout your body (cardiovascular system) experiences stress. If sleep apnea remains untreated, the following conditions can develop:  High blood pressure (hypertension).  Coronary artery disease.  Inability to achieve or maintain an erection (impotence).  Impairment of your thought process (cognitive dysfunction). There are three types of sleep apnea: 1. Obstructive sleep apnea--Pauses in breathing during sleep because of a blocked airway. 2. Central sleep apnea--Pauses in breathing during sleep because the area of the brain that controls your breathing does not send the correct signals to the muscles that control breathing. 3. Mixed sleep apnea--A combination of both obstructive and central sleep apnea. RISK FACTORS The following risk factors can increase your risk of developing sleep apnea:  Being overweight.  Smoking.  Having narrow passages in your nose and throat.  Being of older age.  Being male.  Alcohol use.  Sedative and tranquilizer use.  Ethnicity. Among individuals younger than 35 years, African Americans are at increased risk of sleep apnea. SYMPTOMS   Difficulty staying asleep.  Daytime sleepiness and fatigue.  Loss of energy.  Irritability.  Loud, heavy snoring.  Morning headaches.  Trouble concentrating.  Forgetfulness.  Decreased interest in sex.  Unexplained sleepiness. DIAGNOSIS  In order to diagnose sleep apnea, your caregiver will perform a physical examination. A sleep study done in the comfort of your own home may be appropriate if you are otherwise healthy. Your caregiver may also  recommend that you spend the night in a sleep lab. In the sleep lab, several monitors record information about your heart, lungs, and brain while you sleep. Your leg and arm movements and blood oxygen level are also recorded. TREATMENT The following actions may help to resolve mild sleep apnea:  Sleeping on your side.   Using a decongestant if you have nasal congestion.   Avoiding the use of depressants, including alcohol, sedatives, and narcotics.   Losing weight and modifying your diet if you are overweight. There also are devices and treatments to help open your airway:  Oral appliances. These are custom-made mouthpieces that shift your lower jaw forward and slightly open your bite. This opens your airway.  Devices that create positive airway pressure. This positive pressure "splints" your airway open to help you breathe better during sleep. The following devices create positive airway pressure:  Continuous positive airway pressure (CPAP) device. The CPAP device creates a continuous level of air pressure with an air pump. The air is delivered to your airway through a mask while you sleep. This continuous pressure keeps your airway open.  Nasal expiratory positive airway pressure (EPAP) device. The EPAP device creates positive air pressure as you exhale. The device consists of single-use valves, which are inserted into each nostril and held in place by adhesive. The valves create very little resistance when you inhale but create much more resistance when you exhale. That increased resistance creates the positive airway pressure. This positive pressure while you exhale keeps your airway open, making it easier to breath when you inhale again.  Bilevel positive airway pressure (BPAP) device. The BPAP device is used mainly in patients with central sleep apnea. This device is  similar to the CPAP device because it also uses an air pump to deliver continuous air pressure through a mask. However,  with the BPAP machine, the pressure is set at two different levels. The pressure when you exhale is lower than the pressure when you inhale.  Surgery. Typically, surgery is only done if you cannot comply with less invasive treatments or if the less invasive treatments do not improve your condition. Surgery involves removing excess tissue in your airway to create a wider passage way.   This information is not intended to replace advice given to you by your health care provider. Make sure you discuss any questions you have with your health care provider.   Document Released: 12/10/2001 Document Revised: 01/10/2014 Document Reviewed: 04/28/2011 Elsevier Interactive Patient Education [employer].

## 2015-09-29 NOTE — Progress Notes (Signed)
[facility] Consultation      Date: 09/29/2015,   MRN# [id number] Michael Colon Nov 10, 1930 Code Status:  Code Status History    Date Active Date Inactive Code Status Order ID Comments User Context   09/01/2014  1:28 PM 09/04/2014  6:52 PM Full Code [id number]  Dereck Leep, MD Inpatient     Hosp day:@LENGTHOFSTAYDAYS @ Referring MD: @ATDPROV @     PCP:      AdmissionWeight: 237 lb (107.5 kg)                 CurrentWeight: 237 lb (107.5 kg) Michael Colon is a 82 y.o. old male seen in consultation for sleep apnea at the request of Dr. Silvio Pate.     CHIEF COMPLAINT:   Problems sleeping at night, SOB at night   HISTORY OF PRESENT ILLNESS  Patient here for follow up sleep study Recommend autoCPAP 10-16cm h20  No other resp issues at this time PFTRatio 73% fev1 80%, flow volumes loops show concavity  No signs of infection at this time     Current Medication:  Current Outpatient Prescriptions:  .  atorvastatin (LIPITOR) 80 MG tablet, Take 1 tablet (80 mg total) by mouth daily., Disp: 90 tablet, Rfl: 3 .  BAYER CONTOUR TEST test strip, USE STRIP AS INSTRUCTED TO TEST BLOOD SUGAR TWICE DAILY, Disp: 100 each, Rfl: 11 .  docusate sodium (COLACE) 100 MG capsule, Take 100 mg by mouth 2 (two) times daily. , Disp: , Rfl:  .  ezetimibe (ZETIA) 10 MG tablet, Take 1 tablet (10 mg total) by mouth daily., Disp: 90 tablet, Rfl: 3 .  famotidine (PEPCID) 20 MG tablet, Take 20 mg by mouth daily. , Disp: , Rfl:  .  finasteride (PROSCAR) 5 MG tablet, TAKE ONE TABLET BY MOUTH ONCE DAILY, Disp: 90 tablet, Rfl: 1 .  fish oil-omega-3 fatty acids 1000 MG capsule, Take 1 g by mouth daily. , Disp: , Rfl:  .  Glucosamine Sulfate-MSM (GLUCOSAMINE-MSM DS) 500-500 MG TABS, Take 1,500 mg by mouth 2 (two) times daily. , Disp: , Rfl:  .  glucose blood test strip, Use as instructed to test blood sugar once daily dx: 250.00, Disp: , Rfl:  .  ibuprofen (ADVIL,MOTRIN) 200 MG tablet, Take 400  mg by mouth 2 (two) times daily., Disp: , Rfl:  .  loratadine (CLARITIN) 10 MG tablet, Take 10 mg by mouth daily.  , Disp: , Rfl:  .  metFORMIN (GLUCOPHAGE) 500 MG tablet, Take 1 tablet (500 mg total) by mouth 2 (two) times daily with a meal., Disp: 180 tablet, Rfl: 3 .  MICROLET LANCETS MISC, Use to test twice daily or as directed, Disp: 100 each, Rfl: 3 .  Multiple Vitamins-Minerals (CENTRUM SILVER PO), Take 1 tablet by mouth daily after breakfast. , Disp: , Rfl:  .  nitroGLYCERIN (NITROSTAT) 0.4 MG SL tablet, Place 0.4 mg under the tongue every 5 (five) minutes as needed for chest pain., Disp: , Rfl:  .  tamsulosin (FLOMAX) 0.4 MG CAPS capsule, TAKE ONE CAPSULE BY MOUTH ONCE DAILY, Disp: 90 capsule, Rfl: 0 .  triamterene-hydrochlorothiazide (DYAZIDE) 37.5-25 MG capsule, TAKE ONE CAPSULE BY MOUTH IN THE MORNING, Disp: 90 capsule, Rfl: 3 .  warfarin (COUMADIN) 5 MG tablet, TAKE ONE TO ONE & ONE-HALF TABLETS BY MOUTH ONCE DAILY AS DIRECTED, Disp: 135 tablet, Rfl: 1    ALLERGIES   Enoxaparin sodium; Latex; Nickel; and Percocet [oxycodone-acetaminophen]     REVIEW OF SYSTEMS  Review of Systems  Constitutional: Negative for chills, diaphoresis, fever, malaise/fatigue and weight loss.  HENT: Negative for congestion.   Respiratory: Negative for cough, hemoptysis, sputum production, shortness of breath and wheezing.   Cardiovascular: Negative for chest pain, palpitations and orthopnea.  Gastrointestinal: Negative for abdominal pain, heartburn, nausea and vomiting.  Neurological: Negative for weakness.  Endo/Heme/Allergies: Does not bruise/bleed easily.  All other systems reviewed and are negative.    VS: BP 130/80 (BP Location: Right Arm, Cuff Size: Normal)   Pulse (!) 56   Wt 237 lb (107.5 kg)   SpO2 99%   BMI 33.05 kg/m      PHYSICAL EXAM  Physical Exam  Constitutional: He is oriented to person, place, and time. He appears well-developed and well-nourished. No distress.    Cardiovascular: Normal rate, regular rhythm and normal heart sounds.   No murmur heard. Pulmonary/Chest: Effort normal and breath sounds normal. No stridor. No respiratory distress. He has no wheezes.  Musculoskeletal: Normal range of motion. He exhibits no edema.  Neurological: He is alert and oriented to person, place, and time. No cranial nerve deficit.  Skin: Skin is warm. He is not diaphoretic.  Psychiatric: He has a normal mood and affect.   CT cardiac scoring lung images reviewed LLL 3 MM nodule noted   CT ch    ASSESSMENT/PLAN   82 yo white male with long standing sleep apnea , will start AUTO CPAP 10-16 cm h20 and assess  Has mild intermittent SOB ,  patient is previous smoker PFT c/w mild COPD Gold Stage A  1.will need to check ONO 2.auto CPAP 10-16 cm h20 3.incidental 3 MM nodule-follow up in 1 year with CT chest 4.no inhalers at this time   I have personally obtained a history, examined the patient, evaluated laboratory and independently reviewed imaging results, formulated the assessment and plan and placed orders.  The Patient requires high complexity decision making for assessment and support, frequent evaluation and titration of therapies, application of advanced monitoring technologies and extensive interpretation of multiple databases.    Patient  satisfied with Plan of action and management. All questions answered  Corrin Parker, M.D.  [facility]  Medical Director [place] Director [facility] Cardio-Pulmonary Department

## 2015-09-30 ENCOUNTER — Ambulatory Visit: Payer: Medicare Other

## 2015-10-01 ENCOUNTER — Ambulatory Visit: Payer: Medicare Other | Admitting: Internal Medicine

## 2015-10-02 ENCOUNTER — Ambulatory Visit: Payer: Medicare Other | Admitting: Internal Medicine

## 2015-10-07 ENCOUNTER — Ambulatory Visit (INDEPENDENT_AMBULATORY_CARE_PROVIDER_SITE_OTHER): Payer: Medicare Other

## 2015-10-07 DIAGNOSIS — Z5181 Encounter for therapeutic drug level monitoring: Secondary | ICD-10-CM | POA: Diagnosis not present

## 2015-10-07 DIAGNOSIS — I4892 Unspecified atrial flutter: Secondary | ICD-10-CM | POA: Diagnosis not present

## 2015-10-07 DIAGNOSIS — Z7901 Long term (current) use of anticoagulants: Secondary | ICD-10-CM | POA: Diagnosis not present

## 2015-10-07 LAB — POCT INR: INR: 3.6

## 2015-10-09 ENCOUNTER — Ambulatory Visit (INDEPENDENT_AMBULATORY_CARE_PROVIDER_SITE_OTHER): Payer: Medicare Other | Admitting: Internal Medicine

## 2015-10-09 ENCOUNTER — Encounter: Payer: Self-pay | Admitting: Internal Medicine

## 2015-10-09 VITALS — BP 126/70 | HR 60 | Temp 97.4°F | Wt 241.0 lb

## 2015-10-09 DIAGNOSIS — I2510 Atherosclerotic heart disease of native coronary artery without angina pectoris: Secondary | ICD-10-CM

## 2015-10-09 DIAGNOSIS — S8011XA Contusion of right lower leg, initial encounter: Secondary | ICD-10-CM | POA: Diagnosis not present

## 2015-10-09 NOTE — Assessment & Plan Note (Signed)
Not particularly concerning but he has ongoing significant pain that may be related to the depth of the contusion (maybe because he fell on his camera) No worrisome findings though Discussed still icing ---especially if painful after activity Okay to continue same meds

## 2015-10-09 NOTE — Progress Notes (Signed)
Pre visit review using our clinic review tool, if applicable. No additional management support is needed unless otherwise documented below in the visit note. 

## 2015-10-09 NOTE — Progress Notes (Signed)
Subjective:    Patient ID: Michael Colon, male    DOB: 01-24-1930, 82 y.o.   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 atorvastatin (LIPITOR) 80 MG tablet Take 1 tablet (80 mg total) by mouth daily. 90 tablet 3  . BAYER CONTOUR TEST test strip USE STRIP AS INSTRUCTED TO TEST BLOOD SUGAR TWICE DAILY 100 each 11  . docusate sodium (COLACE) 100 MG capsule Take 100 mg by mouth 2 (two) times daily.     Marland Kitchen ezetimibe (ZETIA) 10 MG tablet Take 1 tablet (10 mg total) by mouth daily. 90 tablet 3  . finasteride (PROSCAR) 5 MG tablet TAKE ONE TABLET BY MOUTH ONCE DAILY 90 tablet 1  . fish oil-omega-3 fatty acids 1000 MG capsule Take 1 g by mouth daily.     . Glucosamine Sulfate-MSM (GLUCOSAMINE-MSM DS) 500-500 MG TABS Take 1,500 mg by mouth daily.     Marland Kitchen glucose blood test strip Use as instructed to test blood sugar once daily dx: 250.00    . ibuprofen (ADVIL,MOTRIN) 200 MG tablet Take 600 mg by mouth 3 (three) times daily.     Marland Kitchen loratadine (CLARITIN) 10 MG tablet Take 10 mg by mouth daily.      . metFORMIN (GLUCOPHAGE) 500 MG tablet Take 1 tablet (500 mg total) by mouth 2 (two) times daily with a meal. 180 tablet 3  . MICROLET LANCETS MISC Use to test twice daily or as directed 100 each 3  . Multiple Vitamins-Minerals (CENTRUM SILVER PO) Take 1 tablet by mouth  daily after breakfast.     . nitroGLYCERIN (NITROSTAT) 0.4 MG SL tablet Place 0.4 mg under the tongue every 5 (five) minutes as needed for chest pain.    . tamsulosin (FLOMAX) 0.4 MG CAPS capsule TAKE ONE CAPSULE BY MOUTH ONCE DAILY 90 capsule 0  . triamterene-hydrochlorothiazide (DYAZIDE) 37.5-25 MG capsule TAKE ONE CAPSULE BY MOUTH IN THE MORNING 90 capsule 3  . warfarin (COUMADIN) 5 MG tablet TAKE ONE TO ONE & ONE-HALF TABLETS BY MOUTH ONCE DAILY AS DIRECTED 135 tablet 1  . famotidine (PEPCID) 20 MG tablet Take 20 mg by mouth daily.      No current facility-administered medications on file prior to visit.     Allergies  Allergen Reactions  . Enoxaparin Sodium Hives  . Latex Rash  . Nickel Rash  . Percocet [Oxycodone-Acetaminophen] Rash    Past Medical History:  Diagnosis Date  . Asthma   . Atrial flutter ([place]) 2007  . Band keratopathy   . Benign prostatic hypertrophy   . Chronic ear infection   . Chronic kidney disease   . Chronic prostatitis   . Diabetes mellitus   . GERD (gastroesophageal reflux disease)    laryngeal involvement  . Hyperlipidemia   . Hypertension   .  Obstructive sleep apnea    CPAP-9  . Osteoarthrosis, localized, primary, knee    post-traumatic  . Prostatitis   . Stroke [facility])    TIA  . Tachy-brady syndrome ([place])   . UTI (urinary tract infection)     Past Surgical History:  Procedure Laterality Date  . APPENDECTOMY    . BELPHAROPTOSIS REPAIR     Dr [name]---didn't resolve weepy eye and eyelid drooping  . CARDIOVERSION  04/13/2010  . CATARACT EXTRACTION, BILATERAL  2009  . Chest pain  8/12   Stress test benign  . EYE SURGERY    . FRACTURE SURGERY Left    elbow  . KNEE SURGERY  1998   plate after fracture, then removed for infection  . left elbow surgery    . MASTOIDECTOMY  8/08   Dr Idelle Crouch  . RHINOPLASTY  5/10   and septoplasty  . SUBACROMIAL DECOMPRESSION Right 2005   Arthroscopic (for rotator cuff and biceps tendon ruptures)  .  TEAR DUCT PROBING  11/13   Dr Vickki Muff  . TOTAL KNEE ARTHROPLASTY Left 09/01/2014   Procedure: TOTAL KNEE ARTHROPLASTY;  Surgeon: Dereck Leep, MD;  Location: [facility] ORS;  Service: Orthopedics;  Laterality: Left;  . TRIGGER FINGER RELEASE Left 02/25/2015   Procedure: LEFT LONG TRIGGER RELEASE;  Surgeon: Dereck Leep, MD;  Location: [facility] ORS;  Service: Orthopedics;  Laterality: Left;  Marland Kitchen VENOUS ABLATION      Family History  Problem Relation Age of Onset  . Colon cancer Father   . Diabetes Father   . Hypertension Father   . Other Mother     natural causes  . Heart attack Neg Hx   . Stroke Neg Hx     Social History   Social History  . Marital status: Married    Spouse name: N/A  . Number of children: 2  . 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 Smoking status: Former Smoker    Packs/day: 1.00    Years: 0.00    Types: Cigarettes    Quit date: 01/03/1969  . Smokeless tobacco: Never Used  . Alcohol use No     Comment: rare wine  . Drug use: No  . Sexual activity: Not on file   Other Topics Concern  . Not on file   Social History Narrative   Has living will   Health care POA-- wife and then son Shanon Brow   Has DNR order from the past---form redone 06/25/10   Probably no feeding tube if cognitively unaware   Review of Systems Takes the ibuprofen bid generally --so not much change in dose Has used cane at times--but able to walk with care    Objective:   Physical Exam  Musculoskeletal:  Yellowed discoloration along lateral right upper thigh No hip tenderness and fairly normal ROM At most small amount of blood in tissues (fairly symmetric with other leg)  Neurological:  Slow but steady gait No leg weakness          Assessment & Plan:

## 2015-10-20 DIAGNOSIS — H18423 Band keratopathy, bilateral: Secondary | ICD-10-CM | POA: Diagnosis not present

## 2015-10-20 LAB — HM DIABETES EYE EXAM

## 2015-10-22 ENCOUNTER — Encounter: Payer: Self-pay | Admitting: Internal Medicine

## 2015-11-04 ENCOUNTER — Ambulatory Visit (INDEPENDENT_AMBULATORY_CARE_PROVIDER_SITE_OTHER): Payer: Medicare Other

## 2015-11-04 DIAGNOSIS — I4892 Unspecified atrial flutter: Secondary | ICD-10-CM

## 2015-11-04 DIAGNOSIS — Z7901 Long term (current) use of anticoagulants: Secondary | ICD-10-CM

## 2015-11-04 DIAGNOSIS — Z5181 Encounter for therapeutic drug level monitoring: Secondary | ICD-10-CM

## 2015-11-04 LAB — POCT INR: INR: 2.7

## 2015-11-11 ENCOUNTER — Other Ambulatory Visit: Payer: Self-pay | Admitting: Cardiovascular Disease

## 2015-11-15 ENCOUNTER — Encounter: Payer: Self-pay | Admitting: Internal Medicine

## 2015-12-02 ENCOUNTER — Ambulatory Visit (INDEPENDENT_AMBULATORY_CARE_PROVIDER_SITE_OTHER): Payer: Medicare Other

## 2015-12-02 DIAGNOSIS — Z5181 Encounter for therapeutic drug level monitoring: Secondary | ICD-10-CM

## 2015-12-02 DIAGNOSIS — I2510 Atherosclerotic heart disease of native coronary artery without angina pectoris: Secondary | ICD-10-CM

## 2015-12-02 DIAGNOSIS — Z7901 Long term (current) use of anticoagulants: Secondary | ICD-10-CM | POA: Diagnosis not present

## 2015-12-02 DIAGNOSIS — I4892 Unspecified atrial flutter: Secondary | ICD-10-CM

## 2015-12-02 LAB — POCT INR: INR: 3.1

## 2015-12-07 DIAGNOSIS — D485 Neoplasm of uncertain behavior of skin: Secondary | ICD-10-CM | POA: Diagnosis not present

## 2015-12-07 DIAGNOSIS — Z85828 Personal history of other malignant neoplasm of skin: Secondary | ICD-10-CM | POA: Diagnosis not present

## 2015-12-07 DIAGNOSIS — Z08 Encounter for follow-up examination after completed treatment for malignant neoplasm: Secondary | ICD-10-CM | POA: Diagnosis not present

## 2015-12-07 DIAGNOSIS — D0422 Carcinoma in situ of skin of left ear and external auricular canal: Secondary | ICD-10-CM | POA: Diagnosis not present

## 2015-12-07 DIAGNOSIS — L02821 Furuncle of head [any part, except face]: Secondary | ICD-10-CM | POA: Diagnosis not present

## 2015-12-11 ENCOUNTER — Telehealth: Payer: Self-pay

## 2015-12-11 ENCOUNTER — Ambulatory Visit (INDEPENDENT_AMBULATORY_CARE_PROVIDER_SITE_OTHER): Payer: Medicare Other | Admitting: Family Medicine

## 2015-12-11 ENCOUNTER — Telehealth: Payer: Self-pay | Admitting: Internal Medicine

## 2015-12-11 ENCOUNTER — Telehealth: Payer: Self-pay | Admitting: *Deleted

## 2015-12-11 ENCOUNTER — Encounter: Payer: Self-pay | Admitting: Family Medicine

## 2015-12-11 ENCOUNTER — Other Ambulatory Visit: Payer: Self-pay | Admitting: Internal Medicine

## 2015-12-11 DIAGNOSIS — N50819 Testicular pain, unspecified: Secondary | ICD-10-CM

## 2015-12-11 DIAGNOSIS — I2510 Atherosclerotic heart disease of native coronary artery without angina pectoris: Secondary | ICD-10-CM | POA: Diagnosis not present

## 2015-12-11 DIAGNOSIS — R6883 Chills (without fever): Secondary | ICD-10-CM

## 2015-12-11 DIAGNOSIS — N3000 Acute cystitis without hematuria: Secondary | ICD-10-CM | POA: Diagnosis not present

## 2015-12-11 LAB — COMPREHENSIVE METABOLIC PANEL
ALT: 63 U/L — ABNORMAL HIGH (ref 0–53)
AST: 44 U/L — ABNORMAL HIGH (ref 0–37)
Albumin: 3.7 g/dL (ref 3.5–5.2)
Alkaline Phosphatase: 78 U/L (ref 39–117)
BUN: 33 mg/dL — ABNORMAL HIGH (ref 6–23)
CO2: 28 mEq/L (ref 19–32)
Calcium: 10.4 mg/dL (ref 8.4–10.5)
Chloride: 100 mEq/L (ref 96–112)
Creatinine, Ser: 1.12 mg/dL (ref 0.40–1.50)
GFR: 66.54 mL/min (ref >60.00)
Glucose, Bld: 251 mg/dL — ABNORMAL HIGH (ref 70–99)
Potassium: 4.2 mEq/L (ref 3.5–5.1)
Sodium: 135 mEq/L (ref 135–145)
Total Bilirubin: 1.2 mg/dL (ref 0.2–1.2)
Total Protein: 6.5 g/dL (ref 6.0–8.3)

## 2015-12-11 LAB — POC URINALSYSI DIPSTICK (AUTOMATED)
Bilirubin, UA: NEGATIVE
Ketones, UA: NEGATIVE
Nitrite, UA: NEGATIVE
Spec Grav, UA: >=1.03
Urobilinogen, UA: 0.2
pH, UA: 5.5

## 2015-12-11 LAB — CBC WITH DIFFERENTIAL/PLATELET
Basophils Absolute: 0 10*3/uL (ref 0.0–0.1)
Basophils Relative: 0 % (ref 0.0–3.0)
Eosinophils Absolute: 0 10*3/uL (ref 0.0–0.7)
Eosinophils Relative: 0 % (ref 0.0–5.0)
HCT: 44.5 % (ref 39.0–52.0)
Hemoglobin: 15.1 g/dL (ref 13.0–17.0)
Lymphocytes Relative: 1.2 % — ABNORMAL LOW (ref 12.0–46.0)
Lymphs Abs: 0.3 10*3/uL — ABNORMAL LOW (ref 0.7–4.0)
MCHC: 33.8 g/dL (ref 30.0–36.0)
MCV: 91.6 fl (ref 78.0–100.0)
Monocytes Absolute: 1.5 10*3/uL — ABNORMAL HIGH (ref 0.1–1.0)
Monocytes Relative: 6 % (ref 3.0–12.0)
Neutro Abs: 22.6 10*3/uL — ABNORMAL HIGH (ref 1.4–7.7)
Neutrophils Relative %: 92.8 % — ABNORMAL HIGH (ref 43.0–77.0)
Platelets: 115 10*3/uL — ABNORMAL LOW (ref 150.0–400.0)
RBC: 4.85 Mil/uL (ref 4.22–5.81)
RDW: 12.9 % (ref 11.5–15.5)
WBC: 24.5 10*3/uL (ref 4.0–10.5)

## 2015-12-11 MED ORDER — GLUCOSE BLOOD VI STRP: ORAL_STRIP | 11 refills | Status: DC

## 2015-12-11 MED ORDER — SULFAMETHOXAZOLE-TRIMETHOPRIM 400-80 MG PO TABS: 1.0000 | ORAL_TABLET | Freq: Two times a day (BID) | ORAL | 0 refills | Status: DC

## 2015-12-11 NOTE — Telephone Encounter (Signed)
Pt has appt with Dr Silvio Pate this morning at 11:00AM.

## 2015-12-11 NOTE — Addendum Note (Signed)
Addended by: Pilar Grammes on: 12/11/2015 04:10 PM   Modules accepted: Orders

## 2015-12-11 NOTE — Telephone Encounter (Signed)
[facility] Patient Name: JIARUI SEAT DOB: 10/05/1930 Initial Comment Caller States the patient is having flu symptoms, thinks he may also have a UTI, he is shaking Nurse Assessment Nurse: Markus Daft, RN, Sherre Poot Date/Time (Eastern Time): 12/11/2015 8:26:11 AM Confirm and document reason for call. If symptomatic, describe symptoms. ---Caller states the patient is having flu like symptoms with runny nose this AM, thinks he may also have a UTI with some pain with urination, He was having violent shaking last night. 93 temp by mouth then moving on up to 96 this AM within less than an hr and shaking started back. He's weak. Started yesterday. He has appt for 11 today. Does the patient have any new or worsening symptoms? ---Yes Will a triage be completed? ---Yes Related visit to physician within the last 2 weeks? ---No Does the PT have any chronic conditions? (i.e. diabetes, asthma, etc.) ---Yes List chronic conditions. ---prostatitis, sleep apnea, diabetes Is this a behavioral health or substance abuse call? ---No Guidelines Guideline Title Affirmed Question Affirmed Notes Urination Pain - Male Diabetes mellitus or weak immune system (e.g., HIV positive, cancer chemotherapy, transplant patient) Diabetes - High Blood Sugar [1] Symptoms of high blood sugar (e.g., frequent urination, weak, weight loss) AND [2] not able to test blood glucose Final Disposition User See Physician within [place], [place], [place] Comments 300 fasting blood sugar.  Unable to test a 2nd time - out of strips now. Referrals REFERRED TO PCP OFFICE Disagree/Comply: Comply Disagree/Comply: Comply

## 2015-12-11 NOTE — Telephone Encounter (Signed)
[facility] called with a critical result on pt. WBC is 24.5. Hard copy of results given to Tor Netters. [facility] results are still pending before all are released to EPIC.

## 2015-12-11 NOTE — Telephone Encounter (Signed)
Please check to see how he is since he canceled the appointment

## 2015-12-11 NOTE — Telephone Encounter (Signed)
PLEASE NOTE: All timestamps contained within this report are represented as [place] Standard Time. CONFIDENTIALTY NOTICE: This fax transmission is intended only for the addressee. It contains information that is legally privileged, confidential or otherwise protected from use or disclosure. If you are not the intended recipient, you are strictly prohibited from reviewing, disclosing, copying using or disseminating any of this information or taking any action in reliance on or regarding this information. If you have received this fax in error, please notify us immediately by telephone so that we can arrange for its return to [place]. Phone: [phone], Toll-Free: [phone], Fax: [phone] Page: 1 of 2 Call Id: [id number] [place] Patient Name: Michael Colon Gender: Male DOB: Jun 16, 1930 Age: 82 Y 28 D Return Phone Number: [phone] (Primary) Address: City/State/Zip: [place] [address] Client [facility] Night - Client Client Site [place] Physician [name] - MD Contact Type Call Who Is Calling Patient / Member / Family / Caregiver Call Type Triage / Clinical Caller Name Justo Mowbray Relationship To Patient Spouse Return Phone Number [phone] (Primary) Chief Complaint Fever (non urgent symptom) (> THREE MONTHS) Reason for Call Symptomatic / Request for Health Information Initial Comment Caller's husband is violently shaking with chills. Husband has fever of 99.8(mouth). Husband has achy pain all over. PreDisposition InappropriateToAsk Translation No Nurse Assessment Nurse: Cockrum, RN, Margaret Date/Time (Eastern Time): 12/10/2015 10:14:28 PM Confirm and document reason for call. If symptomatic, describe symptoms. ---Caller states that husband had violent shaking chills temp is 99.8 oral. Patient has body aches all  over. Patient is nauseous at present. Denies cold symptoms at present. Denies SOB, difficulty breathing. Does the patient have any new or worsening symptoms? ---Yes Will a triage be completed? ---Yes Related visit to physician within the last 2 weeks? ---No Does the PT have any chronic conditions? (i.e. diabetes, asthma, etc.) ---Yes List chronic conditions. ---hypertension, diabetes, heart problems. Is this a behavioral health or substance abuse call? ---No Guidelines Guideline Title Affirmed Question Affirmed Notes Nurse Date/Time (Eastern Time) Weakness (Generalized) and Fatigue [1] SEVERE weakness (i.e., unable to walk or barely able to walk, requires support) AND [2] new onset or worsening Cockrum, RN, Joycelyn Schmid 12/10/2015 10:20:13 PM Disp. Time Eilene Ghazi Time) Disposition Final User PLEASE NOTE: All timestamps contained within this report are represented as [place] Standard Time. CONFIDENTIALTY NOTICE: This fax transmission is intended only for the addressee. It contains information that is legally privileged, confidential or otherwise protected from use or disclosure. If you are not the intended recipient, you are strictly prohibited from reviewing, disclosing, copying using or disseminating any of this information or taking any action in reliance on or regarding this information. If you have received this fax in error, please notify us immediately by telephone so that we can arrange for its return to [place]. Phone: [phone], Toll-Free: [phone], Fax: [phone] Page: 2 of 2 Call Id: [id number] 12/10/2015 10:21:20 PM Call EMS 911 Now Yes Cockrum, RN, Jamesetta Geralds Understands: Yes Disagree/Comply: Comply Care Advice Given Per Guideline CALL EMS 911 NOW: Immediate medical attention is needed. You need to hang up and call 911 (or an ambulance). Psychologist, forensic Discretion: I'll call you back in a few minutes to be sure you were able to reach them.) BRING MEDICINES: * It is also a  good idea to bring the pill bottles too. This will help the doctor to make certain you are taking  the right medicines and the right dose. CARE ADVICE given per Weakness and Fatigue (Adult) guideline. Comments User: Chinita Pester, RN Date/Time Eilene Ghazi Time): 12/10/2015 10:23:04 PM Patient unable to get off bed suddenly nauseated, body aches. Patient normally can walk, and get up without help and he seems to be agitate to the nurse. User: Chinita Pester, RN Date/Time Eilene Ghazi Time): 12/10/2015 10:36:22 PM Ems at house with patient now. Referrals [facility] - ED

## 2015-12-11 NOTE — Patient Instructions (Addendum)
Please stop at the front desk and make an appointment to have your INR checked Please let us know if you have any new bleeding    Urinary Tract Infection, Adult A urinary tract infection (UTI) is an infection of any part of the urinary tract, which includes the kidneys, ureters, bladder, and urethra. These organs make, store, and get rid of urine in the body. UTI can be a bladder infection (cystitis) or kidney infection (pyelonephritis). What are the causes? This infection may be caused by fungi, viruses, or bacteria. Bacteria are the most common cause of UTIs. This condition can also be caused by repeated incomplete emptying of the bladder during urination. What increases the risk? This condition is more likely to develop if:  You ignore your need to urinate or hold urine for long periods of time.  You do not empty your bladder completely during urination.  You wipe back to front after urinating or having a bowel movement, if you are male.  You are uncircumcised, if you are male.  You are constipated.  You have a urinary catheter that stays in place (indwelling).  You have a weak defense (immune) system.  You have a medical condition that affects your bowels, kidneys, or bladder.  You have diabetes.  You take antibiotic medicines frequently or for long periods of time, and the antibiotics no longer work well against certain types of infections (antibiotic resistance).  You take medicines that irritate your urinary tract.  You are exposed to chemicals that irritate your urinary tract.  You are male. What are the signs or symptoms? Symptoms of this condition include:  Fever.  Frequent urination or passing small amounts of urine frequently.  Needing to urinate urgently.  Pain or burning with urination.  Urine that smells bad or unusual.  Cloudy urine.  Pain in the lower abdomen or back.  Trouble urinating.  Blood in the urine.  Vomiting or being less hungry  than normal.  Diarrhea or abdominal pain.  Vaginal discharge, if you are male. How is this diagnosed? This condition is diagnosed with a medical history and physical exam. You will also need to provide a urine sample to test your urine. Other tests may be done, including:  Blood tests.  Sexually transmitted disease (STD) testing. If you have had more than one UTI, a cystoscopy or imaging studies may be done to determine the cause of the infections. How is this treated? Treatment for this condition often includes a combination of two or more of the following:  Antibiotic medicine.  Other medicines to treat less common causes of UTI.  Over-the-counter medicines to treat pain.  Drinking enough water to stay hydrated. Follow these instructions at home:  Take over-the-counter and prescription medicines only as told by your health care provider.  If you were prescribed an antibiotic, take it as told by your health care provider. Do not stop taking the antibiotic even if you start to feel better.  Avoid alcohol, caffeine, tea, and carbonated beverages. They can irritate your bladder.  Drink enough fluid to keep your urine clear or pale yellow.  Keep all follow-up visits as told by your health care provider. This is important.  Make sure to:  Empty your bladder often and completely. Do not hold urine for long periods of time.  Empty your bladder before and after sex.  Wipe from front to back after a bowel movement if you are male. Use each tissue one time when you wipe. Contact a health  care provider if:  You have back pain.  You have a fever.  You feel nauseous or vomit.  Your symptoms do not get better after 3 days.  Your symptoms go away and then return. Get help right away if:  You have severe back pain or lower abdominal pain.  You are vomiting and cannot keep down any medicines or water. This information is not intended to replace advice given to you by your  health care provider. Make sure you discuss any questions you have with your health care provider. Document Released: 09/29/2004 Document Revised: 06/03/2015 Document Reviewed: 11/10/2014 Elsevier Interactive Patient Education  2017 [employer].

## 2015-12-11 NOTE — Telephone Encounter (Signed)
Okay Will check him then

## 2015-12-11 NOTE — Telephone Encounter (Signed)
I spoke with Mrs Michael Colon; Michael Colon did not go to ED last night after EMS was there; was able to get up and move around and fever went down; this morning Michael Colon has started again with fever and chills; scheduled appt to see Dr Silvio Pate today at 11:00 AM.

## 2015-12-11 NOTE — Progress Notes (Signed)
Pre visit review using our clinic review tool, if applicable. No additional management support is needed unless otherwise documented below in the visit note. 

## 2015-12-11 NOTE — Telephone Encounter (Signed)
Somehow, he is on [employer] schedule at [facility] .

## 2015-12-11 NOTE — Progress Notes (Signed)
Subjective:    Patient ID: Michael Colon, male    DOB: November 28, 1930, 82 y.o.   MRN: [id number]  HPI This is a 82 yo male, accompanied by his wife, who presents today with fever and testicular pain (tendess with washing). Felt unwell yesterday and had an episode of violent shaking this morning. Called 911 and vitals were stable and temp 99. Had an episode of urinary incontinence with dark yellow urine. Feels different from previous prostatitis- no belly or back pain. Has noticed abdominal pain following BM over last several days. Blood sugar high today and yesterday. No nausea. No diarrhea. Feels tired and weak.   Past Medical History:  Diagnosis Date  . Asthma   . Atrial flutter ([place]) 2007  . Band keratopathy   . Benign prostatic hypertrophy   . Chronic ear infection   . Chronic kidney disease   . Chronic prostatitis   . Diabetes mellitus   . GERD (gastroesophageal reflux disease)    laryngeal involvement  . Hyperlipidemia   . Hypertension   . Obstructive sleep apnea    CPAP-9  . Osteoarthrosis, localized, primary, knee    post-traumatic  . Prostatitis   . Stroke [facility])    TIA  . Tachy-brady syndrome ([place])   . UTI (urinary tract infection)    Past Surgical History:  Procedure Laterality Date  . APPENDECTOMY    . BELPHAROPTOSIS REPAIR     Dr [name]---didn't resolve weepy eye and eyelid drooping  . CARDIOVERSION  04/13/2010  . CATARACT EXTRACTION, BILATERAL  2009  . Chest pain  8/12   Stress test benign  . EYE SURGERY    . FRACTURE SURGERY Left    elbow  . KNEE SURGERY  1998   plate after fracture, then removed for infection  . left elbow surgery    . MASTOIDECTOMY  8/08   Dr Idelle Crouch  . RHINOPLASTY  5/10   and septoplasty  . SUBACROMIAL DECOMPRESSION Right 2005   Arthroscopic (for rotator cuff and biceps tendon ruptures)  . TEAR DUCT PROBING  11/13   Dr Vickki Muff  . TOTAL KNEE ARTHROPLASTY Left 09/01/2014   Procedure: TOTAL KNEE ARTHROPLASTY;  Surgeon: Dereck Leep, MD;  Location: [facility] ORS;  Service: Orthopedics;  Laterality: Left;  . TRIGGER FINGER RELEASE Left 02/25/2015   Procedure: LEFT LONG TRIGGER RELEASE;  Surgeon: Dereck Leep, MD;  Location: [facility] ORS;  Service: Orthopedics;  Laterality: Left;  Marland Kitchen VENOUS ABLATION     Family History  Problem Relation Age of Onset  . Colon cancer Father   . Diabetes Father   . Hypertension Father   . Other Mother     natural causes  . Heart attack Neg Hx   . Stroke Neg Hx    Social History  Substance Use Topics  . Smoking status: Former Smoker    Packs/day: 1.00    Years: 0.00    Types: Cigarettes    Quit date: 01/03/1969  . Smokeless tobacco: Never Used  . Alcohol use No     Comment: rare wine      Review of Systems Per HPI    Objective:   Physical Exam  Constitutional: He is oriented to person, place, and time. He appears well-developed and well-nourished. No distress.  Elderly male, does not appear toxic, able to ambulate with steady gait.   HENT:  Head: Normocephalic and atraumatic.  Eyes: Conjunctivae are normal.  Cardiovascular: Normal rate, regular rhythm and normal heart sounds.  Pulmonary/Chest: Effort normal and breath sounds normal.  Abdominal: Soft. He exhibits no distension. There is no tenderness. There is no rebound and no guarding.  Neurological: He is alert and oriented to person, place, and time.  Skin: Skin is warm and dry. He is not diaphoretic.  Psychiatric: He has a normal mood and affect. His behavior is normal. Judgment and thought content normal.  Vitals reviewed.     BP 124/68   Pulse 60   Temp (!) 96.7 F (35.9 C) (Tympanic)   Wt 220 lb 8 oz (100 kg)   BMI 30.75 kg/m  Wt Readings from Last 3 Encounters:  12/11/15 220 lb 8 oz (100 kg)  10/09/15 241 lb (109.3 kg)  09/29/15 237 lb (107.5 kg)   Results for orders placed or performed in visit on 12/11/15  POCT Urinalysis Dipstick (Automated)  Result Value Ref Range   Color, UA Dark Yellow     Clarity, UA Cloudy    Glucose, UA 2+    Bilirubin, UA Negative    Ketones, UA Negative    Spec Grav, UA >=1.030    Blood, UA 1+    pH, UA 5.5    Protein, UA Trace    Urobilinogen, UA 0.2    Nitrite, UA Negative    Leukocytes, UA moderate (2+) (A) Negative       Assessment & Plan:  1. Acute cystitis without hematuria - CBC with Differential/Platelet - Comprehensive metabolic panel - sulfamethoxazole-trimethoprim (BACTRIM) 400-80 MG tablet; Take 1 tablet by mouth 2 (two) times daily.  Dispense: 14 tablet; Refill: 0 - Ambulatory referral to Urology - Urine culture - instructed patient and his wife to start antibiotic immediately, that he is at risk for sepsis - RTC/ER if more rigors/fever, confusion, vomiting - hydrate aggressively - Check INR in 5 days due to antibiotic   2. Testicle pain - this only occurred with bathing today - POCT Urinalysis Dipstick (Automated)  3. Shaking chills - CBC with Differential/Platelet - Comprehensive metabolic panel   Clarene Reamer, FNP-BC  [facility], [place]  12/11/2015 5:07 PM

## 2015-12-14 LAB — URINE CULTURE

## 2015-12-16 ENCOUNTER — Ambulatory Visit (INDEPENDENT_AMBULATORY_CARE_PROVIDER_SITE_OTHER): Payer: Medicare Other

## 2015-12-16 DIAGNOSIS — I2510 Atherosclerotic heart disease of native coronary artery without angina pectoris: Secondary | ICD-10-CM

## 2015-12-16 DIAGNOSIS — Z5181 Encounter for therapeutic drug level monitoring: Secondary | ICD-10-CM | POA: Diagnosis not present

## 2015-12-16 DIAGNOSIS — I4892 Unspecified atrial flutter: Secondary | ICD-10-CM

## 2015-12-16 DIAGNOSIS — Z7901 Long term (current) use of anticoagulants: Secondary | ICD-10-CM

## 2015-12-16 DIAGNOSIS — E1142 Type 2 diabetes mellitus with diabetic polyneuropathy: Secondary | ICD-10-CM | POA: Diagnosis not present

## 2015-12-16 DIAGNOSIS — L851 Acquired keratosis [keratoderma] palmaris et plantaris: Secondary | ICD-10-CM | POA: Diagnosis not present

## 2015-12-16 DIAGNOSIS — B351 Tinea unguium: Secondary | ICD-10-CM | POA: Diagnosis not present

## 2015-12-16 LAB — POCT INR: INR: 2.2

## 2015-12-17 ENCOUNTER — Ambulatory Visit: Payer: Medicare Other | Admitting: Internal Medicine

## 2015-12-18 ENCOUNTER — Other Ambulatory Visit: Payer: Self-pay | Admitting: Internal Medicine

## 2015-12-29 ENCOUNTER — Ambulatory Visit (INDEPENDENT_AMBULATORY_CARE_PROVIDER_SITE_OTHER): Payer: Medicare Other | Admitting: Internal Medicine

## 2015-12-29 ENCOUNTER — Encounter: Payer: Self-pay | Admitting: Internal Medicine

## 2015-12-29 VITALS — BP 122/72 | HR 55 | Temp 97.3°F | Wt 233.0 lb

## 2015-12-29 DIAGNOSIS — I4892 Unspecified atrial flutter: Secondary | ICD-10-CM

## 2015-12-29 DIAGNOSIS — I10 Essential (primary) hypertension: Secondary | ICD-10-CM | POA: Diagnosis not present

## 2015-12-29 DIAGNOSIS — I2510 Atherosclerotic heart disease of native coronary artery without angina pectoris: Secondary | ICD-10-CM | POA: Diagnosis not present

## 2015-12-29 DIAGNOSIS — E1140 Type 2 diabetes mellitus with diabetic neuropathy, unspecified: Secondary | ICD-10-CM

## 2015-12-29 DIAGNOSIS — N401 Enlarged prostate with lower urinary tract symptoms: Secondary | ICD-10-CM | POA: Diagnosis not present

## 2015-12-29 DIAGNOSIS — N138 Other obstructive and reflux uropathy: Secondary | ICD-10-CM

## 2015-12-29 LAB — LIPID PANEL
Cholesterol: 103 mg/dL (ref 0–200)
HDL: 63.1 mg/dL (ref >39.00)
LDL Cholesterol: 26 mg/dL (ref 0–99)
NonHDL: 39.64
Total CHOL/HDL Ratio: 2
Triglycerides: 69 mg/dL (ref 0.0–149.0)
VLDL: 13.8 mg/dL (ref 0.0–40.0)

## 2015-12-29 LAB — HEMOGLOBIN A1C: Hgb A1c MFr Bld: 10 % — ABNORMAL HIGH (ref 4.6–6.5)

## 2015-12-29 LAB — HM DIABETES FOOT EXAM

## 2015-12-29 NOTE — Progress Notes (Signed)
Pre visit review using our clinic review tool, if applicable. No additional management support is needed unless otherwise documented below in the visit note. 

## 2015-12-29 NOTE — Assessment & Plan Note (Signed)
Has had infections despite dual therapy Will need to see urology and consider Rx

## 2015-12-29 NOTE — Progress Notes (Signed)
Subjective:    Patient ID: Michael [facility], male    DOB: Apr 21, 1930, 82 y.o.   MRN: [id number]  HPI Here for follow up of diabetes and other chronic medical conditions  Does feel better from the recent UTI Continues on dual therapy Does have appt with Dr Pilar Jarvis  Reviewed his obstructive sleep apnea Uses the machine every night autotitrate--- 10-16cm pressure Having trouble with his mask--- awakens at night at times May need BiPap Does plan to follow up with Dr Mortimer Fries soon  Checks sugars regularly--every few days Up to 330 when he was sick Normally 120-130 fasting No hypoglycemic reactions Wearing foot orthotics and an ankle wrap No pain or sores in feet  No chest pain No SOB No palpitations Will have a problem with equilibrium when stepping down--no dizziness  Current Outpatient Prescriptions on File Prior to Visit  Medication Sig Dispense Refill  . atorvastatin (LIPITOR) 80 MG tablet Take 1 tablet (80 mg total) by mouth daily. 90 tablet 3  . docusate sodium (COLACE) 100 MG capsule Take 100 mg by mouth 2 (two) times daily.     Marland Kitchen ezetimibe (ZETIA) 10 MG tablet Take 1 tablet (10 mg total) by mouth daily. 90 tablet 3  . famotidine (PEPCID) 20 MG tablet Take 20 mg by mouth daily.     . finasteride (PROSCAR) 5 MG tablet TAKE ONE TABLET BY MOUTH ONCE DAILY 90 tablet 1  . fish oil-omega-3 fatty acids 1000 MG capsule Take 1 g by mouth daily.     . Glucosamine Sulfate-MSM (GLUCOSAMINE-MSM DS) 500-500 MG TABS Take 1,500 mg by mouth daily.     Marland Kitchen glucose blood (BAYER CONTOUR TEST) test strip USE STRIP TO TEST BLOOD SUGAR TWICE DAILY AS INSTRUCTED. Dx Code: E11.40 100 each 11  . ibuprofen (ADVIL,MOTRIN) 200 MG tablet Take 600 mg by mouth 3 (three) times daily.     Marland Kitchen loratadine (CLARITIN) 10 MG tablet Take 10 mg by mouth daily.      . metFORMIN (GLUCOPHAGE) 500 MG tablet Take 1 tablet (500 mg total) by mouth 2 (two) times daily with a meal. 180 tablet 3  . MICROLET LANCETS MISC Use to  test twice daily or as directed 100 each 3  . Multiple Vitamins-Minerals (CENTRUM SILVER PO) Take 1 tablet by mouth daily after breakfast.     . nitroGLYCERIN (NITROSTAT) 0.4 MG SL tablet Place 0.4 mg under the tongue every 5 (five) minutes as needed for chest pain.    . tamsulosin (FLOMAX) 0.4 MG CAPS capsule TAKE ONE CAPSULE BY MOUTH ONCE DAILY 90 capsule 3  . triamterene-hydrochlorothiazide (DYAZIDE) 37.5-25 MG capsule TAKE ONE CAPSULE BY MOUTH IN THE MORNING 90 capsule 3  . warfarin (COUMADIN) 5 MG tablet TAKE ONE TO ONE & ONE-HALF TABLETS BY MOUTH ONCE DAILY AS DIRECTED 135 tablet 1   No current facility-administered medications on file prior to visit.     Allergies  Allergen Reactions  . Enoxaparin Sodium Hives  . Latex Rash  . Nickel Rash  . Percocet [Oxycodone-Acetaminophen] Rash    Past Medical History:  Diagnosis Date  . Asthma   . Atrial flutter ([place]) 2007  . Band keratopathy   . Benign prostatic hypertrophy   . Chronic ear infection   . Chronic kidney disease   . Chronic prostatitis   . Diabetes mellitus   . GERD (gastroesophageal reflux disease)    laryngeal involvement  . Hyperlipidemia   . Hypertension   . Obstructive sleep apnea  CPAP-9  . Osteoarthrosis, localized, primary, knee    post-traumatic  . Prostatitis   . Stroke [facility])    TIA  . Tachy-brady syndrome ([place])   . UTI (urinary tract infection)     Past Surgical History:  Procedure Laterality Date  . APPENDECTOMY    . BELPHAROPTOSIS REPAIR     Dr [name]---didn't resolve weepy eye and eyelid drooping  . CARDIOVERSION  04/13/2010  . CATARACT EXTRACTION, BILATERAL  2009  . Chest pain  8/12   Stress test benign  . EYE SURGERY    . FRACTURE SURGERY Left    elbow  . KNEE SURGERY  1998   plate after fracture, then removed for infection  . left elbow surgery    . MASTOIDECTOMY  8/08   Dr Idelle Crouch  . RHINOPLASTY  5/10   and septoplasty  . SUBACROMIAL DECOMPRESSION Right 2005   Arthroscopic  (for rotator cuff and biceps tendon ruptures)  . TEAR DUCT PROBING  11/13   Dr Vickki Muff  . TOTAL KNEE ARTHROPLASTY Left 09/01/2014   Procedure: TOTAL KNEE ARTHROPLASTY;  Surgeon: Dereck Leep, MD;  Location: [facility] ORS;  Service: Orthopedics;  Laterality: Left;  . TRIGGER FINGER RELEASE Left 02/25/2015   Procedure: LEFT LONG TRIGGER RELEASE;  Surgeon: Dereck Leep, MD;  Location: [facility] ORS;  Service: Orthopedics;  Laterality: Left;  Marland Kitchen VENOUS ABLATION      Family History  Problem Relation Age of Onset  . Colon cancer Father   . Diabetes Father   . Hypertension Father   . Other Mother     natural causes  . Heart attack Neg Hx   . Stroke Neg Hx     Social History   Social History  . Marital status: Married    Spouse name: N/A  . Number of children: 2  . 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 Smoking status: Former Smoker    Packs/day: 1.00    Years: 0.00    Types: Cigarettes    Quit date: 01/03/1969  . Smokeless tobacco: Never Used  . Alcohol use No     Comment: rare wine  . Drug use: No  . Sexual activity: Not on file   Other Topics Concern  . Not on file   Social History Narrative   Has living will   Health care POA-- wife and then son Shanon Brow   Has DNR order from the past---form redone 06/25/10   Probably no feeding tube if cognitively unaware   Review of Systems Appetite is good Weight is about the same---lost weight during the illness and gained it back Does nap in day at times Does have to clear his throat frequently    Objective:   Physical Exam  Constitutional: He appears well-nourished. No distress.  Neck: Normal range of motion. Neck supple.  Cardiovascular: Normal rate, regular rhythm, normal heart sounds and intact distal pulses.  Exam reveals no gallop.   No murmur heard. Pulmonary/Chest: Effort normal and breath sounds normal. No respiratory distress. He has no wheezes. He has  no rales.  Abdominal: Soft. There is no tenderness.  Musculoskeletal:  Trace edema -- left > right  Lymphadenopathy:    He has no cervical adenopathy.  Neurological:  Decreased sensation in feet  Skin:  No foot lesions          Assessment & Plan:

## 2015-12-29 NOTE — Assessment & Plan Note (Signed)
BP Readings from Last 3 Encounters:  12/29/15 122/72  12/11/15 124/68  10/09/15 126/70   Good control

## 2015-12-29 NOTE — Assessment & Plan Note (Signed)
Seems to still have overall good control Will check labs

## 2015-12-29 NOTE — Assessment & Plan Note (Signed)
No symptoms Continues on the coumadin

## 2015-12-30 ENCOUNTER — Other Ambulatory Visit: Payer: Self-pay | Admitting: Internal Medicine

## 2015-12-30 DIAGNOSIS — E1140 Type 2 diabetes mellitus with diabetic neuropathy, unspecified: Secondary | ICD-10-CM

## 2015-12-30 DIAGNOSIS — E1165 Type 2 diabetes mellitus with hyperglycemia: Principal | ICD-10-CM

## 2015-12-30 DIAGNOSIS — IMO0002 Reserved for concepts with insufficient information to code with codable children: Secondary | ICD-10-CM

## 2016-01-01 ENCOUNTER — Other Ambulatory Visit: Payer: Self-pay | Admitting: Internal Medicine

## 2016-01-07 ENCOUNTER — Other Ambulatory Visit: Payer: Self-pay | Admitting: Cardiovascular Disease

## 2016-01-07 NOTE — Telephone Encounter (Signed)
Refill Request.  

## 2016-01-11 ENCOUNTER — Ambulatory Visit (INDEPENDENT_AMBULATORY_CARE_PROVIDER_SITE_OTHER): Payer: Medicare Other | Admitting: Internal Medicine

## 2016-01-11 ENCOUNTER — Encounter: Payer: Self-pay | Admitting: Internal Medicine

## 2016-01-11 VITALS — BP 130/78 | HR 56 | Wt 237.0 lb

## 2016-01-11 DIAGNOSIS — G4730 Sleep apnea, unspecified: Secondary | ICD-10-CM | POA: Diagnosis not present

## 2016-01-11 DIAGNOSIS — G4733 Obstructive sleep apnea (adult) (pediatric): Secondary | ICD-10-CM

## 2016-01-11 NOTE — Progress Notes (Signed)
Michael [facility]      Date: 01/11/2016,   MRN# [id number] Michael [facility] May 10, 1930 Code Status:  Code Status History    Date Active Date Inactive Code Status Order ID Comments User Context   09/01/2014  1:28 PM 09/04/2014  6:52 PM Full Code [id number]  Michael Leep, MD Inpatient     Hosp day:@LENGTHOFSTAYDAYS @ Referring MD: @ATDPROV @     PCP:      AdmissionWeight: 237 lb (107.5 kg)                 CurrentWeight: 237 lb (107.5 kg) Michael [facility] is a 83 y.o. old male seen in [place] for sleep apnea at the request of Dr. Silvio [facility].     CHIEF COMPLAINT:   Follow up sleep apnea   HISTORY OF PRESENT ILLNESS  Patient here for follow up autocpap therapy  autoCPAP 10-16cm h20 Patient has had mask issues Compliance reports show excessive leaks, probably mask issues Very much bothered by it AHI score is 4.5 with therapy  No other resp issues at this time PFTRatio 73% fev1 80%, flow volumes loops show concavity  No signs of infection at this time     Current Medication:  Current Outpatient Prescriptions:  .  atorvastatin (LIPITOR) 80 MG tablet, Take 1 tablet (80 mg total) by mouth daily., Disp: 90 tablet, Rfl: 3 .  docusate sodium (COLACE) 100 MG capsule, Take 100 mg by mouth 2 (two) times daily. , Disp: , Rfl:  .  ezetimibe (ZETIA) 10 MG tablet, Take 1 tablet (10 mg total) by mouth daily., Disp: 90 tablet, Rfl: 3 .  famotidine (PEPCID) 20 MG tablet, Take 20 mg by mouth daily. , Disp: , Rfl:  .  finasteride (PROSCAR) 5 MG tablet, TAKE ONE TABLET BY MOUTH ONCE DAILY, Disp: 90 tablet, Rfl: 1 .  fish oil-omega-3 fatty acids 1000 MG capsule, Take 1 g by mouth daily. , Disp: , Rfl:  .  Glucosamine Sulfate-MSM (GLUCOSAMINE-MSM DS) 500-500 MG TABS, Take 1,500 mg by mouth daily. , Disp: , Rfl:  .  glucose blood (BAYER CONTOUR TEST) test strip, USE STRIP TO TEST BLOOD SUGAR TWICE DAILY AS INSTRUCTED. Dx Code: E11.40, Disp: 100 each, Rfl: 11 .  ibuprofen  (ADVIL,MOTRIN) 200 MG tablet, Take 600 mg by mouth 3 (three) times daily. , Disp: , Rfl:  .  loratadine (CLARITIN) 10 MG tablet, Take 10 mg by mouth daily.  , Disp: , Rfl:  .  metFORMIN (GLUCOPHAGE) 500 MG tablet, TAKE ONE TABLET BY MOUTH TWICE DAILY WITH  A  MEAL, Disp: 180 tablet, Rfl: 3 .  MICROLET LANCETS MISC, Use to test twice daily or as directed, Disp: 100 each, Rfl: 3 .  Multiple Vitamins-Minerals (CENTRUM SILVER PO), Take 1 tablet by mouth daily after breakfast. , Disp: , Rfl:  .  nitroGLYCERIN (NITROSTAT) 0.4 MG SL tablet, Place 0.4 mg under the tongue every 5 (five) minutes as needed for chest pain., Disp: , Rfl:  .  tamsulosin (FLOMAX) 0.4 MG CAPS capsule, TAKE ONE CAPSULE BY MOUTH ONCE DAILY, Disp: 90 capsule, Rfl: 3 .  triamterene-hydrochlorothiazide (DYAZIDE) 37.5-25 MG capsule, TAKE ONE CAPSULE BY MOUTH IN THE MORNING, Disp: 90 capsule, Rfl: 3 .  warfarin (COUMADIN) 5 MG tablet, TAKE ONE TO ONE & ONE-HALF TABLETS BY MOUTH ONCE DAILY AS DIRECTED, Disp: 135 tablet, Rfl: 1    ALLERGIES   Enoxaparin sodium; Latex; Nickel; and Percocet [oxycodone-acetaminophen]     REVIEW OF SYSTEMS  Review of Systems  Constitutional: Negative for chills, diaphoresis, fever, malaise/fatigue and weight loss.  HENT: Negative for congestion.   Respiratory: Negative for cough, hemoptysis, sputum production, shortness of breath and wheezing.   Cardiovascular: Negative for chest pain, palpitations and orthopnea.  Gastrointestinal: Negative for abdominal pain, heartburn, nausea and vomiting.  Neurological: Negative for weakness.  Endo/Heme/Allergies: Does not bruise/bleed easily.  All other systems reviewed and are negative.    VS: BP 130/78 (BP Location: Right Arm, Cuff Size: Normal)   Pulse (!) 56   Wt 237 lb (107.5 kg)   SpO2 98%   BMI 33.05 kg/m      PHYSICAL EXAM  Physical Exam  Constitutional: He is oriented to person, place, and time. He appears well-developed and  well-nourished. No distress.  Cardiovascular: Normal rate, regular rhythm and normal heart sounds.   No murmur heard. Pulmonary/Chest: Effort normal and breath sounds normal. No stridor. No respiratory distress. He has no wheezes.  Musculoskeletal: Normal range of motion. He exhibits no edema.  Neurological: He is alert and oriented to person, place, and time. No cranial nerve deficit.  Skin: Skin is warm. He is not diaphoretic.  Psychiatric: He has a normal mood and affect.   CT cardiac scoring lung images reviewed LLL 3 MM nodule noted   CT ch    ASSESSMENT/PLAN   83 yo white male with long standing sleep apnea , will continue  AUTO CPAP 10-16 cm h20 and assess  Has mild intermittent SOB ,  patient is previous smoker PFT c/w mild COPD Gold Stage A  1.continue auto CPAP 10-16 cm h20 2.incidental 3 MM nodule-follow up in 1 year with CT chest 3.no inhalers at this time 4.mask fitting appointment in [place]   I have personally obtained a history, examined the patient, evaluated laboratory and independently reviewed imaging results, formulated the assessment and plan and placed orders.  The Patient requires high complexity decision making for assessment and support, frequent evaluation and titration of therapies, application of advanced monitoring technologies and extensive interpretation of multiple databases.    Patient  satisfied with Plan of action and management. All questions answered  [facility], M.D.  [facility]  Medical Director [facility] Director [facility] Cardio-Pulmonary Department

## 2016-01-11 NOTE — Patient Instructions (Signed)
Follow up mask fitting

## 2016-01-13 ENCOUNTER — Ambulatory Visit (INDEPENDENT_AMBULATORY_CARE_PROVIDER_SITE_OTHER): Payer: Medicare Other

## 2016-01-13 DIAGNOSIS — Z7901 Long term (current) use of anticoagulants: Secondary | ICD-10-CM

## 2016-01-13 DIAGNOSIS — Z5181 Encounter for therapeutic drug level monitoring: Secondary | ICD-10-CM

## 2016-01-13 DIAGNOSIS — I4892 Unspecified atrial flutter: Secondary | ICD-10-CM | POA: Diagnosis not present

## 2016-01-13 LAB — POCT INR: INR: 2.4

## 2016-01-14 ENCOUNTER — Encounter: Payer: Self-pay | Admitting: Urology

## 2016-01-14 ENCOUNTER — Ambulatory Visit (INDEPENDENT_AMBULATORY_CARE_PROVIDER_SITE_OTHER): Payer: Medicare Other | Admitting: Urology

## 2016-01-14 VITALS — BP 139/80 | HR 58 | Ht 71.0 in | Wt 236.7 lb

## 2016-01-14 DIAGNOSIS — N3020 Other chronic cystitis without hematuria: Secondary | ICD-10-CM | POA: Diagnosis not present

## 2016-01-14 DIAGNOSIS — N3000 Acute cystitis without hematuria: Secondary | ICD-10-CM

## 2016-01-14 DIAGNOSIS — N400 Enlarged prostate without lower urinary tract symptoms: Secondary | ICD-10-CM

## 2016-01-14 LAB — URINALYSIS, COMPLETE
Bilirubin, UA: NEGATIVE
Ketones, UA: NEGATIVE
Nitrite, UA: POSITIVE — AB
Protein, UA: NEGATIVE
Specific Gravity, UA: 1.015 (ref 1.005–1.030)
Urobilinogen, Ur: 0.2 mg/dL (ref 0.2–1.0)
pH, UA: 5.5 (ref 5.0–7.5)

## 2016-01-14 LAB — MICROSCOPIC EXAMINATION: RBC, UA: >30 /hpf — AB (ref 0–?)

## 2016-01-14 LAB — BLADDER SCAN AMB NON-IMAGING: Scan Result: 18

## 2016-01-14 NOTE — Progress Notes (Signed)
01/14/2016 4:05 PM   Michael [facility] 1930/06/04 ED:[id number]  Referring provider: Venia Carbon, MD [place], [place] [id number]  Chief Complaint  Patient presents with  . New Patient (Initial Visit)    acute cystitis     HPI: The patient is an 83 year old gentleman with a past medical history of BPH on Flomax and finasteride presents today for evaluation after an episode of acute cystitis.  His most recent culture grew pansensitive Escherichia coli. The patient states that he has been diagnosed with prostatitis approximate 20 times since his college years. In the last year he has had 1 urinary tract infection. In the last 2 years he has had a total of 2 urinary tract infections. During his last surgery tract infection, he had high-grade fevers as well as rigors. Currently, he has no symptoms of urinary tract infection. He is voiding at baseline. Nocturia 1. He denies daytime frequency. He notices some intermittency. He does not find the symptoms bothersome. His urinalysis today is concerning for infection, though he has has no symptoms at this time.   He also has a history of gross hematuria. He underwent full hematuria workup previously which was negative. The thought was that his warfarin was causing hypervascularity of his prostate to bleed.     PMH: Past Medical History:  Diagnosis Date  . Asthma   . Atrial flutter ([place]) 2007  . Band keratopathy   . Benign prostatic hypertrophy   . Chronic ear infection   . Chronic kidney disease   . Chronic prostatitis   . Diabetes mellitus   . GERD (gastroesophageal reflux disease)    laryngeal involvement  . Hyperlipidemia   . Hypertension   . Obstructive sleep apnea    CPAP-9  . Osteoarthrosis, localized, primary, knee    post-traumatic  . Prostatitis   . Stroke [facility])    TIA  . Tachy-brady syndrome ([place])   . UTI (urinary tract infection)     Surgical History: Past Surgical History:  Procedure Laterality  Date  . APPENDECTOMY    . BELPHAROPTOSIS REPAIR     Dr [name]---didn't resolve weepy eye and eyelid drooping  . CARDIOVERSION  04/13/2010  . CATARACT EXTRACTION, BILATERAL  2009  . Chest pain  8/12   Stress test benign  . EYE SURGERY    . FRACTURE SURGERY Left    elbow  . KNEE SURGERY  1998   plate after fracture, then removed for infection  . left elbow surgery    . MASTOIDECTOMY  8/08   Dr Idelle Crouch  . RHINOPLASTY  5/10   and septoplasty  . SUBACROMIAL DECOMPRESSION Right 2005   Arthroscopic (for rotator cuff and biceps tendon ruptures)  . TEAR DUCT PROBING  11/13   Dr Vickki Muff  . TOTAL KNEE ARTHROPLASTY Left 09/01/2014   Procedure: TOTAL KNEE ARTHROPLASTY;  Surgeon: Dereck Leep, MD;  Location: [facility] ORS;  Service: Orthopedics;  Laterality: Left;  . TRIGGER FINGER RELEASE Left 02/25/2015   Procedure: LEFT LONG TRIGGER RELEASE;  Surgeon: Dereck Leep, MD;  Location: [facility] ORS;  Service: Orthopedics;  Laterality: Left;  Marland Kitchen VENOUS ABLATION      Home Medications:  Allergies as of 01/14/2016      Reactions   Enoxaparin Sodium Hives   Latex Rash   Nickel Rash   Percocet [oxycodone-acetaminophen] Rash      Medication List       Accurate as of 01/14/16  4:05 PM. Always use your  most recent med list.          atorvastatin 80 MG tablet Commonly known as:  LIPITOR Take 1 tablet (80 mg total) by mouth daily.   CENTRUM SILVER PO Take 1 tablet by mouth daily after breakfast.   docusate sodium 100 MG capsule Commonly known as:  COLACE Take 100 mg by mouth 2 (two) times daily.   ezetimibe 10 MG tablet Commonly known as:  ZETIA Take 1 tablet (10 mg total) by mouth daily.   famotidine 20 MG tablet Commonly known as:  PEPCID Take 20 mg by mouth daily.   finasteride 5 MG tablet Commonly known as:  PROSCAR TAKE ONE TABLET BY MOUTH ONCE DAILY   fish oil-omega-3 fatty acids 1000 MG capsule Take 1 g by mouth daily.   GLUCOSAMINE-MSM DS 500-500 MG Tabs Generic drug:   Glucosamine Sulfate-MSM Take 1,500 mg by mouth daily.   glucose blood test strip Commonly known as:  BAYER CONTOUR TEST USE STRIP TO TEST BLOOD SUGAR TWICE DAILY AS INSTRUCTED. Dx Code: E11.40   ibuprofen 200 MG tablet Commonly known as:  ADVIL,MOTRIN Take 600 mg by mouth 3 (three) times daily.   loratadine 10 MG tablet Commonly known as:  CLARITIN Take 10 mg by mouth daily.   metFORMIN 500 MG tablet Commonly known as:  GLUCOPHAGE TAKE ONE TABLET BY MOUTH TWICE DAILY WITH  A  MEAL   MICROLET LANCETS Misc Use to test twice daily or as directed   nitroGLYCERIN 0.4 MG SL tablet Commonly known as:  NITROSTAT Place 0.4 mg under the tongue every 5 (five) minutes as needed for chest pain.   tamsulosin 0.4 MG Caps capsule Commonly known as:  FLOMAX TAKE ONE CAPSULE BY MOUTH ONCE DAILY   triamterene-hydrochlorothiazide 37.5-25 MG capsule Commonly known as:  DYAZIDE TAKE ONE CAPSULE BY MOUTH IN THE MORNING   warfarin 5 MG tablet Commonly known as:  COUMADIN TAKE ONE TO ONE & ONE-HALF TABLETS BY MOUTH ONCE DAILY AS DIRECTED       Allergies:  Allergies  Allergen Reactions  . Enoxaparin Sodium Hives  . Latex Rash  . Nickel Rash  . Percocet [Oxycodone-Acetaminophen] Rash    Family History: Family History  Problem Relation Age of Onset  . Colon cancer Father   . Diabetes Father   . Hypertension Father   . Other Mother     natural causes  . Heart attack Neg Hx   . Stroke Neg Hx     Social History:  reports that he quit smoking about 47 years ago. His smoking use included Cigarettes. He smoked 1.00 pack per day for 0.00 years. He has never used smokeless tobacco. He reports that he does not drink alcohol or use drugs.  ROS: UROLOGY Frequent Urination?: Yes Hard to postpone urination?: Yes Burning/pain with urination?: No Get up at night to urinate?: Yes Leakage of urine?: Yes Urine stream starts and stops?: No Trouble starting stream?: No Do you have to strain  to urinate?: No Blood in urine?: Yes Urinary tract infection?: No Sexually transmitted disease?: No Injury to kidneys or bladder?: No Painful intercourse?: No Weak stream?: Yes Erection problems?: Yes Penile pain?: No  Gastrointestinal Nausea?: No Vomiting?: No Indigestion/heartburn?: No Diarrhea?: Yes Constipation?: Yes  Constitutional Fever: Yes Night sweats?: No Weight loss?: No Fatigue?: Yes  Skin Skin rash/lesions?: Yes Itching?: Yes  Eyes Blurred vision?: Yes Double vision?: No  Ears/Nose/Throat Sore throat?: No Sinus problems?: No  Hematologic/Lymphatic Swollen glands?: No Easy bruising?: Yes  Cardiovascular Leg swelling?: Yes Chest pain?: No  Respiratory Cough?: No Shortness of breath?: No  Endocrine Excessive thirst?: No  Musculoskeletal Back pain?: Yes Joint pain?: No  Neurological Headaches?: No Dizziness?: Yes  Psychologic Depression?: No Anxiety?: No  Physical Exam: BP 139/80   Pulse (!) 58   Ht 5\' 11"  (1.803 m)   Wt 236 lb 11.2 oz (107.4 kg)   BMI 33.01 kg/m   Constitutional:  Alert and oriented, No acute distress. HEENT: [place] AT, moist mucus membranes.  Trachea midline, no masses. Cardiovascular: No clubbing, cyanosis, or edema. Respiratory: Normal respiratory effort, no increased work of breathing. GI: Abdomen is soft, nontender, nondistended, no abdominal masses GU: No CVA tennormal phallus. Testicles descended bilaterally. 9. DRE: 2+ smooth benign. Skin: No rashes, bruises or suspicious lesions. Lymph: No cervical or inguinal adenopathy. Neurologic: Grossly intact, no focal deficits, moving all 4 extremities. Psychiatric: Normal mood and affect.  Laboratory Data: Lab Results  Component Value Date   WBC 24.5 Repeated and verified X2. (HH) 12/11/2015   HGB 15.1 12/11/2015   HCT 44.5 12/11/2015   MCV 91.6 12/11/2015   PLT 115.0 (L) 12/11/2015    Lab Results  Component Value Date   CREATININE 1.12 12/11/2015     No results found for: PSA  No results found for: TESTOSTERONE  Lab Results  Component Value Date   HGBA1C 10.0 (H) 12/29/2015    Urinalysis    Component Value Date/Time   COLORURINE YELLOW (A) 08/20/2014 1015   APPEARANCEUR CLEAR (A) 08/20/2014 1015   APPEARANCEUR Cloudy 09/08/2013 1854   LABSPEC 1.023 08/20/2014 1015   LABSPEC 1.018 09/08/2013 1854   PHURINE 5.0 08/20/2014 1015   GLUCOSEU 50 (A) 08/20/2014 1015   GLUCOSEU 50 mg/dL 09/08/2013 [place] 08/20/2014 1015   BILIRUBINUR Negative 12/11/2015 1124   BILIRUBINUR Negative 09/08/2013 [place] 08/20/2014 1015   PROTEINUR Trace 12/11/2015 1124   PROTEINUR NEGATIVE 08/20/2014 1015   UROBILINOGEN 0.2 12/11/2015 1124   NITRITE Negative 12/11/2015 1124   NITRITE NEGATIVE 08/20/2014 1015   LEUKOCYTESUR moderate (2+) (A) 12/11/2015 1124   LEUKOCYTESUR 1+ 09/08/2013 1854     Assessment & Plan:    1. BPH 2. UTI -continue flomax and finasteride -I discussed the patient and his wife that he is only having 1-2 urinary tract infections in the last year. He does not need prophylactic therapy for urinary tract infections that are so infrequent. He has safely into his bladder today which rules out urinary retention as a source. -I also discussed with the patient was wife for non-treatment of asymptomatic bacteria. He should only be treated for positive urine cultures if he is having concurrent symptoms. -Patient will follow-up as needed if he develops symptoms of urinary tract infection and become more frequent.   Return if symptoms worsen or fail to improve.  Nickie Retort, MD  [facility] [address] [place],  [address] [phone]

## 2016-01-19 ENCOUNTER — Other Ambulatory Visit (HOSPITAL_BASED_OUTPATIENT_CLINIC_OR_DEPARTMENT_OTHER): Payer: Medicare Other

## 2016-01-27 ENCOUNTER — Ambulatory Visit (HOSPITAL_BASED_OUTPATIENT_CLINIC_OR_DEPARTMENT_OTHER): Payer: Medicare Other | Attending: Internal Medicine | Admitting: Radiology

## 2016-01-27 DIAGNOSIS — G4733 Obstructive sleep apnea (adult) (pediatric): Secondary | ICD-10-CM

## 2016-01-29 ENCOUNTER — Encounter: Payer: Self-pay | Admitting: Internal Medicine

## 2016-01-29 ENCOUNTER — Ambulatory Visit (INDEPENDENT_AMBULATORY_CARE_PROVIDER_SITE_OTHER): Payer: Medicare Other | Admitting: Internal Medicine

## 2016-01-29 VITALS — BP 126/80 | HR 112 | Temp 97.8°F | Wt 237.0 lb

## 2016-01-29 DIAGNOSIS — N401 Enlarged prostate with lower urinary tract symptoms: Secondary | ICD-10-CM | POA: Diagnosis not present

## 2016-01-29 DIAGNOSIS — N138 Other obstructive and reflux uropathy: Secondary | ICD-10-CM

## 2016-01-29 DIAGNOSIS — N3000 Acute cystitis without hematuria: Secondary | ICD-10-CM | POA: Diagnosis not present

## 2016-01-29 MED ORDER — SULFAMETHOXAZOLE-TRIMETHOPRIM 400-80 MG PO TABS: 1.0000 | ORAL_TABLET | Freq: Two times a day (BID) | ORAL | 0 refills | Status: DC

## 2016-01-29 NOTE — Assessment & Plan Note (Signed)
Has had recurrent severe, rapid UTI's  Currently has asymptomatic bacteriuria No retention found on urologic exam so no procedure needed Will give Rx to start if he gets the chills, etc --like the last time

## 2016-01-29 NOTE — Progress Notes (Signed)
Subjective:    Patient ID: Michael Colon, male    DOB: [birth year], 83 y.o.   MRN: [id number]  HPI Here for follow up of the urinary difficulty  Reviewed Dr Carlynn Purl note Did not have urinary retention--the most important factor His urinalysis was abnormal and he came back today to review that  Urinalysis showed 11-30 WBC and >30 RBC and moderate bacteria Had some concerns about the visit with the urologist that he discussed with me  He feels fine now No dysuria No fever  Current Outpatient Prescriptions on File Prior to Visit  Medication Sig Dispense Refill  . atorvastatin (LIPITOR) 80 MG tablet Take 1 tablet (80 mg total) by mouth daily. 90 tablet 3  . docusate sodium (COLACE) 100 MG capsule Take 100 mg by mouth 2 (two) times daily.     Marland Kitchen ezetimibe (ZETIA) 10 MG tablet Take 1 tablet (10 mg total) by mouth daily. 90 tablet 3  . famotidine (PEPCID) 20 MG tablet Take 20 mg by mouth daily.     . finasteride (PROSCAR) 5 MG tablet TAKE ONE TABLET BY MOUTH ONCE DAILY 90 tablet 1  . fish oil-omega-3 fatty acids 1000 MG capsule Take 1 g by mouth daily.     . Glucosamine Sulfate-MSM (GLUCOSAMINE-MSM DS) 500-500 MG TABS Take 1,500 mg by mouth daily.     Marland Kitchen glucose blood (BAYER CONTOUR TEST) test strip USE STRIP TO TEST BLOOD SUGAR TWICE DAILY AS INSTRUCTED. Dx Code: E11.40 (Patient taking differently: 1 each daily. USE STRIP TO TEST BLOOD SUGAR ONCE DAILY AS INSTRUCTED. Dx Code: E11.40) 100 each 11  . ibuprofen (ADVIL,MOTRIN) 200 MG tablet Take 600 mg by mouth 3 (three) times daily.     Marland Kitchen loratadine (CLARITIN) 10 MG tablet Take 10 mg by mouth daily.      . metFORMIN (GLUCOPHAGE) 500 MG tablet TAKE ONE TABLET BY MOUTH TWICE DAILY WITH  A  MEAL 180 tablet 3  . MICROLET LANCETS MISC Use to test twice daily or as directed 100 each 3  . Multiple Vitamins-Minerals (CENTRUM SILVER PO) Take 1 tablet by mouth daily after breakfast.     . nitroGLYCERIN (NITROSTAT) 0.4 MG SL tablet Place 0.4 mg under  the tongue every 5 (five) minutes as needed for chest pain.    . tamsulosin (FLOMAX) 0.4 MG CAPS capsule TAKE ONE CAPSULE BY MOUTH ONCE DAILY 90 capsule 3  . triamterene-hydrochlorothiazide (DYAZIDE) 37.5-25 MG capsule TAKE ONE CAPSULE BY MOUTH IN THE MORNING 90 capsule 3  . warfarin (COUMADIN) 5 MG tablet TAKE ONE TO ONE & ONE-HALF TABLETS BY MOUTH ONCE DAILY AS DIRECTED 135 tablet 1   No current facility-administered medications on file prior to visit.     Allergies  Allergen Reactions  . Enoxaparin Sodium Hives  . Latex Rash  . Nickel Rash  . Percocet [Oxycodone-Acetaminophen] Rash    Past Medical History:  Diagnosis Date  . Asthma   . Atrial flutter ([place]) 2007  . Band keratopathy   . Benign prostatic hypertrophy   . Chronic ear infection   . Chronic kidney disease   . Chronic prostatitis   . Diabetes mellitus   . GERD (gastroesophageal reflux disease)    laryngeal involvement  . Hyperlipidemia   . Hypertension   . Obstructive sleep apnea    CPAP-9  . Osteoarthrosis, localized, primary, knee    post-traumatic  . Prostatitis   . Stroke [facility])    TIA  . Tachy-brady syndrome ([place])   .  UTI (urinary tract infection)     Past Surgical History:  Procedure Laterality Date  . APPENDECTOMY    . BELPHAROPTOSIS REPAIR     Dr [name]---didn't resolve weepy eye and eyelid drooping  . CARDIOVERSION  04/13/2010  . CATARACT EXTRACTION, BILATERAL  2009  . Chest pain  8/12   Stress test benign  . EYE SURGERY    . FRACTURE SURGERY Left    elbow  . KNEE SURGERY  1998   plate after fracture, then removed for infection  . left elbow surgery    . MASTOIDECTOMY  8/08   Dr Idelle Crouch  . RHINOPLASTY  5/10   and septoplasty  . SUBACROMIAL DECOMPRESSION Right 2005   Arthroscopic (for rotator cuff and biceps tendon ruptures)  . TEAR DUCT PROBING  11/13   Dr Vickki Muff  . TOTAL KNEE ARTHROPLASTY Left 09/01/2014   Procedure: TOTAL KNEE ARTHROPLASTY;  Surgeon: Dereck Leep, MD;  Location:  [facility] ORS;  Service: Orthopedics;  Laterality: Left;  . TRIGGER FINGER RELEASE Left 02/25/2015   Procedure: LEFT LONG TRIGGER RELEASE;  Surgeon: Dereck Leep, MD;  Location: [facility] ORS;  Service: Orthopedics;  Laterality: Left;  Marland Kitchen VENOUS ABLATION      Family History  Problem Relation Age of Onset  . Colon cancer Father   . Diabetes Father   . Hypertension Father   . Other Mother     natural causes  . Heart attack Neg Hx   . Stroke Neg Hx     Social History   Social History  . Marital status: Married    Spouse name: N/A  . Number of children: 2  . 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 Smoking status: Former Smoker    Packs/day: 1.00    Years: 0.00    Types: Cigarettes    Quit date: 01/03/1969  . Smokeless tobacco: Never Used  . Alcohol use No     Comment: rare wine  . Drug use: No  . Sexual activity: Not on file   Other Topics Concern  . Not on file   Social History Narrative   Has living will   Health care POA-- wife and then son Shanon Brow   Has DNR order from the past---form redone 06/25/10   Probably no feeding tube if cognitively unaware   Review of Systems No abdominal pain Appetite is fine    Objective:   Physical Exam  Constitutional: He appears well-nourished. No distress.  Abdominal: Soft. There is no tenderness.          Assessment & Plan:

## 2016-01-29 NOTE — Progress Notes (Signed)
Pre visit review using our clinic review tool, if applicable. No additional management support is needed unless otherwise documented below in the visit note. 

## 2016-02-03 ENCOUNTER — Telehealth: Payer: Self-pay | Admitting: Internal Medicine

## 2016-02-03 DIAGNOSIS — G4733 Obstructive sleep apnea (adult) (pediatric): Secondary | ICD-10-CM

## 2016-02-03 NOTE — Telephone Encounter (Signed)
[facility] needs rx staets pt switched from full face mask to nasal mask. Please call.

## 2016-02-03 NOTE — Telephone Encounter (Signed)
New order placed for new mask placed for [facility]. Nothing further needed.

## 2016-02-03 NOTE — Telephone Encounter (Signed)
Pt wife calling asking If we can call her back Would like to know if we received a referral for the patient to get a mask fitting It would be from [place] [facility] Please call back

## 2016-02-03 NOTE — Telephone Encounter (Signed)
Wife informed information update has been sent to DME company. Nothing further needed.

## 2016-02-09 DIAGNOSIS — D0422 Carcinoma in situ of skin of left ear and external auricular canal: Secondary | ICD-10-CM | POA: Diagnosis not present

## 2016-02-09 DIAGNOSIS — L905 Scar conditions and fibrosis of skin: Secondary | ICD-10-CM | POA: Diagnosis not present

## 2016-02-17 ENCOUNTER — Ambulatory Visit (INDEPENDENT_AMBULATORY_CARE_PROVIDER_SITE_OTHER): Payer: Medicare Other

## 2016-02-17 DIAGNOSIS — Z7901 Long term (current) use of anticoagulants: Secondary | ICD-10-CM

## 2016-02-17 DIAGNOSIS — I4892 Unspecified atrial flutter: Secondary | ICD-10-CM | POA: Diagnosis not present

## 2016-02-17 DIAGNOSIS — Z5181 Encounter for therapeutic drug level monitoring: Secondary | ICD-10-CM

## 2016-02-17 LAB — POCT INR: INR: 3.3

## 2016-03-01 DIAGNOSIS — E1142 Type 2 diabetes mellitus with diabetic polyneuropathy: Secondary | ICD-10-CM | POA: Diagnosis not present

## 2016-03-01 DIAGNOSIS — L851 Acquired keratosis [keratoderma] palmaris et plantaris: Secondary | ICD-10-CM | POA: Diagnosis not present

## 2016-03-01 DIAGNOSIS — B351 Tinea unguium: Secondary | ICD-10-CM | POA: Diagnosis not present

## 2016-03-09 ENCOUNTER — Ambulatory Visit (INDEPENDENT_AMBULATORY_CARE_PROVIDER_SITE_OTHER): Payer: Medicare Other

## 2016-03-09 DIAGNOSIS — I4892 Unspecified atrial flutter: Secondary | ICD-10-CM | POA: Diagnosis not present

## 2016-03-09 DIAGNOSIS — Z5181 Encounter for therapeutic drug level monitoring: Secondary | ICD-10-CM

## 2016-03-09 DIAGNOSIS — Z7901 Long term (current) use of anticoagulants: Secondary | ICD-10-CM

## 2016-03-09 LAB — POCT INR: INR: 2.9

## 2016-03-10 DIAGNOSIS — R682 Dry mouth, unspecified: Secondary | ICD-10-CM | POA: Diagnosis not present

## 2016-03-10 DIAGNOSIS — J324 Chronic pansinusitis: Secondary | ICD-10-CM | POA: Diagnosis not present

## 2016-03-10 DIAGNOSIS — H9211 Otorrhea, right ear: Secondary | ICD-10-CM | POA: Diagnosis not present

## 2016-03-10 DIAGNOSIS — K219 Gastro-esophageal reflux disease without esophagitis: Secondary | ICD-10-CM | POA: Diagnosis not present

## 2016-03-10 DIAGNOSIS — J301 Allergic rhinitis due to pollen: Secondary | ICD-10-CM | POA: Diagnosis not present

## 2016-03-14 ENCOUNTER — Other Ambulatory Visit: Payer: Self-pay | Admitting: Internal Medicine

## 2016-03-25 ENCOUNTER — Other Ambulatory Visit: Payer: Self-pay | Admitting: Otolaryngology

## 2016-03-25 DIAGNOSIS — J324 Chronic pansinusitis: Secondary | ICD-10-CM | POA: Diagnosis not present

## 2016-03-25 DIAGNOSIS — R1314 Dysphagia, pharyngoesophageal phase: Secondary | ICD-10-CM | POA: Diagnosis not present

## 2016-03-25 DIAGNOSIS — R1319 Other dysphagia: Secondary | ICD-10-CM

## 2016-03-25 DIAGNOSIS — H9210 Otorrhea, unspecified ear: Secondary | ICD-10-CM | POA: Diagnosis not present

## 2016-03-29 ENCOUNTER — Encounter (INDEPENDENT_AMBULATORY_CARE_PROVIDER_SITE_OTHER): Payer: Self-pay | Admitting: Vascular Surgery

## 2016-03-29 ENCOUNTER — Ambulatory Visit (INDEPENDENT_AMBULATORY_CARE_PROVIDER_SITE_OTHER): Payer: Medicare Other | Admitting: Vascular Surgery

## 2016-03-29 ENCOUNTER — Other Ambulatory Visit (INDEPENDENT_AMBULATORY_CARE_PROVIDER_SITE_OTHER): Payer: Medicare Other

## 2016-03-29 VITALS — BP 135/78 | HR 54 | Resp 16 | Ht 71.0 in | Wt 234.0 lb

## 2016-03-29 DIAGNOSIS — L97201 Non-pressure chronic ulcer of unspecified calf limited to breakdown of skin: Secondary | ICD-10-CM

## 2016-03-29 DIAGNOSIS — M7989 Other specified soft tissue disorders: Secondary | ICD-10-CM | POA: Diagnosis not present

## 2016-03-29 DIAGNOSIS — I4892 Unspecified atrial flutter: Secondary | ICD-10-CM

## 2016-03-29 DIAGNOSIS — E785 Hyperlipidemia, unspecified: Secondary | ICD-10-CM

## 2016-03-29 DIAGNOSIS — E1165 Type 2 diabetes mellitus with hyperglycemia: Secondary | ICD-10-CM

## 2016-03-29 DIAGNOSIS — E1140 Type 2 diabetes mellitus with diabetic neuropathy, unspecified: Secondary | ICD-10-CM

## 2016-03-29 DIAGNOSIS — I10 Essential (primary) hypertension: Secondary | ICD-10-CM | POA: Diagnosis not present

## 2016-03-29 DIAGNOSIS — L97209 Non-pressure chronic ulcer of unspecified calf with unspecified severity: Secondary | ICD-10-CM | POA: Insufficient documentation

## 2016-03-29 DIAGNOSIS — IMO0002 Reserved for concepts with insufficient information to code with codable children: Secondary | ICD-10-CM

## 2016-03-29 LAB — HEMOGLOBIN A1C: Hgb A1c MFr Bld: 9.7 % — ABNORMAL HIGH (ref 4.6–6.5)

## 2016-03-29 NOTE — Assessment & Plan Note (Signed)
blood pressure control important in reducing the progression of atherosclerotic disease. On appropriate oral medications.  

## 2016-03-29 NOTE — Assessment & Plan Note (Signed)
lipid control important in reducing the progression of atherosclerotic disease. Continue statin therapy  

## 2016-03-29 NOTE — Assessment & Plan Note (Signed)
The patient has bilateral lower extremity recurrent poorly healing ulcerations. Although his previous arterial study was normal, that was 7 years ago and I think this needs to be checked again. We're also evaluating him for venous disease which she has had previously. This is a serious situation and further testing is warranted to avoid limb loss. Return after his noninvasive studies.

## 2016-03-29 NOTE — Assessment & Plan Note (Signed)
Likely multifactorial, but certainly he has some lymphedema from his multiple surgeries on the left leg and he has a previous history of venous disease. Compression stockings, elevation, and plan a venous reflux study in the near future at his convenience.

## 2016-03-29 NOTE — Progress Notes (Signed)
Patient ID: Michael Colon, male   DOB: October 16, 1930, 83 y.o.   MRN: [id number]  Chief Complaint  Patient presents with  . New Patient (Initial Visit)    HPI Michael Colon is a 83 y.o. male.  Patient present for evaluation of vascular disease of the lower extremities.  He was originally referred by Dr. Marijean Bravo at [facility] back in 2012, but did not initially keep that appointment as he was doing quite well. Over the past several months however, he has developed slow to heal ulcerations on both lower extremities. He has problems with chronic swelling and this had many surgeries related to a problem with a knee repair on the left several years ago. He had a left great saphenous vein ablation performed about 7 or 8 years ago. He also had arterial studies about 7 years ago which were normal at that time. These ulcerations have been from essentially no trauma with no clear inciting event or causative factor. He has difficulty getting on and off compression stockings and has not been wearing these. He does have a problem with chronic swelling of the lower extremities. He does not have fever or chills. The left lower extremity has been more problematic, but he now has an ulcer on the right leg which has been difficult to heal for several weeks.  Current Outpatient Prescriptions  Medication Sig Dispense Refill  . atorvastatin (LIPITOR) 80 MG tablet Take 1 tablet (80 mg total) by mouth daily. 90 tablet 3  . docusate sodium (COLACE) 100 MG capsule Take 100 mg by mouth 2 (two) times daily.     Marland Kitchen ezetimibe (ZETIA) 10 MG tablet Take 1 tablet (10 mg total) by mouth daily. 90 tablet 3  . famotidine (PEPCID) 20 MG tablet Take 20 mg by mouth daily.     . finasteride (PROSCAR) 5 MG tablet TAKE ONE TABLET BY MOUTH ONCE DAILY 90 tablet 1  . fish oil-omega-3 fatty acids 1000 MG capsule Take 1 g by mouth daily.     . Glucosamine Sulfate-MSM (GLUCOSAMINE-MSM DS) 500-500 MG TABS Take 1,500 mg by mouth daily.     Marland Kitchen glucose  blood (BAYER CONTOUR TEST) test strip USE STRIP TO TEST BLOOD SUGAR TWICE DAILY AS INSTRUCTED. Dx Code: E11.40 (Patient taking differently: 1 each daily. USE STRIP TO TEST BLOOD SUGAR ONCE DAILY AS INSTRUCTED. Dx Code: E11.40) 100 each 11  . ibuprofen (ADVIL,MOTRIN) 200 MG tablet Take 600 mg by mouth 3 (three) times daily.     Marland Kitchen loratadine (CLARITIN) 10 MG tablet Take 10 mg by mouth daily.      . metFORMIN (GLUCOPHAGE) 500 MG tablet TAKE ONE TABLET BY MOUTH TWICE DAILY WITH  A  MEAL 180 tablet 3  . MICROLET LANCETS MISC Use to test twice daily or as directed 100 each 3  . Multiple Vitamins-Minerals (CENTRUM SILVER PO) Take 1 tablet by mouth daily after breakfast.     . nitroGLYCERIN (NITROSTAT) 0.4 MG SL tablet Place 0.4 mg under the tongue every 5 (five) minutes as needed for chest pain.    . tamsulosin (FLOMAX) 0.4 MG CAPS capsule TAKE ONE CAPSULE BY MOUTH ONCE DAILY 90 capsule 3  . triamterene-hydrochlorothiazide (DYAZIDE) 37.5-25 MG capsule TAKE ONE CAPSULE BY MOUTH IN THE MORNING 90 capsule 3  . warfarin (COUMADIN) 5 MG tablet TAKE ONE TO ONE & ONE-HALF TABLETS BY MOUTH ONCE DAILY AS DIRECTED 135 tablet 1  . sulfamethoxazole-trimethoprim (BACTRIM) 400-80 MG tablet Take 1 tablet by mouth 2 (  two) times daily. (Patient not taking: Reported on 03/29/2016) 14 tablet 0   No current facility-administered medications for this visit.      Past Medical History:  Diagnosis Date  . Asthma   . Atrial flutter ([place]) 2007  . Band keratopathy   . Benign prostatic hypertrophy   . Chronic ear infection   . Chronic kidney disease   . Chronic prostatitis   . Diabetes mellitus   . GERD (gastroesophageal reflux disease)    laryngeal involvement  . Hyperlipidemia   . Hypertension   . Obstructive sleep apnea    CPAP-9  . Osteoarthrosis, localized, primary, knee    post-traumatic  . Prostatitis   . Stroke [name])    TIA  . Tachy-brady syndrome ([place])   . UTI (urinary tract infection)     Past  Surgical History:  Procedure Laterality Date  . APPENDECTOMY    . BELPHAROPTOSIS REPAIR     Dr [name]---didn't resolve weepy eye and eyelid drooping  . CARDIOVERSION  04/13/2010  . CATARACT EXTRACTION, BILATERAL  2009  . Chest pain  8/12   Stress test benign  . EYE SURGERY    . FRACTURE SURGERY Left    elbow  . KNEE SURGERY  1998   plate after fracture, then removed for infection  . left elbow surgery    . MASTOIDECTOMY  8/08   Dr Idelle Crouch  . RHINOPLASTY  5/10   and septoplasty  . SUBACROMIAL DECOMPRESSION Right 2005   Arthroscopic (for rotator cuff and biceps tendon ruptures)  . TEAR DUCT PROBING  11/13   Dr Vickki Muff  . TOTAL KNEE ARTHROPLASTY Left 09/01/2014   Procedure: TOTAL KNEE ARTHROPLASTY;  Surgeon: Dereck Leep, MD;  Location: [facility] ORS;  Service: Orthopedics;  Laterality: Left;  . TRIGGER FINGER RELEASE Left 02/25/2015   Procedure: LEFT LONG TRIGGER RELEASE;  Surgeon: Dereck Leep, MD;  Location: [facility] ORS;  Service: Orthopedics;  Laterality: Left;  Marland Kitchen VENOUS ABLATION      Family History  Problem Relation Age of Onset  . Colon cancer Father   . Diabetes Father   . Hypertension Father   . Other Mother     natural causes  . Heart attack Neg Hx   . Stroke Neg Hx     Social History Social History  Substance Use Topics  . Smoking status: Former Smoker    Packs/day: 1.00    Years: 0.00    Types: Cigarettes    Quit date: 01/03/1969  . Smokeless tobacco: Never Used  . Alcohol use No     Comment: rare wine  Married  Allergies  Allergen Reactions  . Enoxaparin Sodium Hives  . Latex Rash  . Nickel Rash  . Percocet [Oxycodone-Acetaminophen] Rash        REVIEW OF SYSTEMS (Negative unless checked)  Constitutional: [] Weight loss  [] Fever  [] Chills Cardiac: [] Chest pain   [] Chest pressure   [] Palpitations   [] Shortness of breath when laying flat   [] Shortness of breath at rest   [] Shortness of breath with exertion. Vascular:  [] Pain in legs with walking    [] Pain in legs at rest   [] Pain in legs when laying flat   [] Claudication   [] Pain in feet when walking  [] Pain in feet at rest  [] Pain in feet when laying flat   [] History of DVT   [] Phlebitis   [x] Swelling in legs   [x] Varicose veins   [x] Non-healing ulcers Pulmonary:   [] Uses home oxygen   [] Productive  cough   [] Hemoptysis   [] Wheeze  [] COPD   [] Asthma Neurologic:  [] Dizziness  [] Blackouts   [] Seizures   [] History of stroke   [] History of TIA  [] Aphasia   [] Temporary blindness   [] Dysphagia   [] Weakness or numbness in arms   [] Weakness or numbness in legs Musculoskeletal:  [x] Arthritis   [] Joint swelling   [] Joint pain   [] Low back pain Hematologic:  [] Easy bruising  [] Easy bleeding   [] Hypercoagulable state   [] Anemic  [] Hepatitis  Gastrointestinal:  [] Blood in stool   [] Vomiting blood  [] Gastroesophageal reflux/heartburn   [] Difficulty swallowing   Genitourinary:  [] Chronic kidney disease   [] Difficult urination  [] Frequent urination  [] Burning with urination   [] Blood in urine Skin:  [] Rashes   [x] Ulcers   [x] Wounds Psychological:  [] History of anxiety   []  History of major depression.  Physical Exam BP 135/78   Pulse (!) 54   Resp 16   Ht 5\' 11"  (1.803 m)   Wt 234 lb (106.1 kg)   BMI 32.64 kg/m   Gen:  WD/WN, NAD. Her junk rhythm stated age Head: [place]/AT, No temporalis wasting.  Ear/Nose/Throat: Hearing grossly intact, nares w/o erythema or drainage, oropharynx w/o Erythema/Exudate Eyes: Sclera non-icteric, conjunctiva clear Neck: Trachea midline.  No JVD.  Pulmonary:  Good air movement, no use of accessory muscles.  Cardiac: irregularly irregular Vascular:  Vessel Right Left  Radial Palpable Palpable                  Femoral Palpable Palpable  Popliteal 1+ Palpable Not Palpable  PT 1+ Palpable Not Palpable  DP 1+ Palpable 1+ Palpable   Gastrointestinal: soft, non-tender/non-distended. No guarding/reflex.  Musculoskeletal: M/S 5/5 throughout.  Extremities without ischemic  changes.  No deformity or atrophy. 1+ right lower extremity edema, 2+ left lower extremity edema. Varicosities scattered bilaterally but stasis changes are present bilaterally. 2 cm ulceration on the right lateral lower calf area. On the left leg, there are scabs from recently healed wounds on the anterior shin and medial calf area.  Neurologic:  Sensation grossly intact in extremities.  Symmetrical.  Speech is fluent. Motor exam as listed above. Psychiatric: Judgment intact, Mood & affect appropriate for pt's clinical situation. Dermatologic: Wounds as described above Lymph : No Cervical, Axillary, or Inguinal lymphadenopathy.    Radiology No results found.  Labs Recent Results (from the past 2160 hour(s))  POCT INR     Status: None   Collection Time: 01/13/16  9:50 AM  Result Value Ref Range   INR 2.4   Urinalysis, Complete     Status: Abnormal   Collection Time: 01/14/16  3:36 PM  Result Value Ref Range   Specific Gravity, UA 1.015 1.005 - 1.030   pH, UA 5.5 5.0 - 7.5   Color, UA Yellow Yellow   Appearance Ur Cloudy (A) Clear   Leukocytes, UA 1+ (A) Negative   Protein, UA Negative Negative/Trace   Glucose, UA 3+ (A) Negative   Ketones, UA Negative Negative   RBC, UA 3+ (A) Negative   Bilirubin, UA Negative Negative   Urobilinogen, Ur 0.2 0.2 - 1.0 mg/dL   Nitrite, UA Positive (A) Negative   Microscopic Examination See below:   Microscopic Examination     Status: Abnormal   Collection Time: 01/14/16  3:36 PM  Result Value Ref Range   WBC, UA 11-30 (A) 0 - 5 /hpf   RBC, UA >30 (A) 0 - 2 /hpf   Epithelial Cells (non renal)  0-10 0 - 10 /hpf   Bacteria, UA Moderate (A) None seen/Few  Bladder Scan (Post Void Residual) in office     Status: None   Collection Time: 01/14/16  3:52 PM  Result Value Ref Range   Scan Result 18   POCT INR     Status: None   Collection Time: 02/17/16  9:56 AM  Result Value Ref Range   INR 3.3   POCT INR     Status: None   Collection Time:  03/09/16 10:21 AM  Result Value Ref Range   INR 2.9   Hemoglobin A1c     Status: Abnormal   Collection Time: 03/29/16 11:14 AM  Result Value Ref Range   Hgb A1c MFr Bld 9.7 (H) 4.6 - 6.5 %    Comment: Glycemic Control Guidelines for People with Diabetes:Non Diabetic:  <6%Goal of Therapy: <7%Additional Action Suggested:  >8%     Assessment/Plan:  Atrial flutter On anticoagulation  Hypertension blood pressure control important in reducing the progression of atherosclerotic disease. On appropriate oral medications.   Type 2 diabetes, controlled, with neuropathy (HCC) blood glucose control important in reducing the progression of atherosclerotic disease. Also, involved in wound healing. On appropriate medications.   Hyperlipidemia lipid control important in reducing the progression of atherosclerotic disease. Continue statin therapy   Swelling of limb Likely multifactorial, but certainly he has some lymphedema from his multiple surgeries on the left leg and he has a previous history of venous disease. Compression stockings, elevation, and plan a venous reflux study in the near future at his convenience.  Lower limb ulcer, calf ([place]) The patient has bilateral lower extremity recurrent poorly healing ulcerations. Although his previous arterial study was normal, that was 7 years ago and I think this needs to be checked again. We're also evaluating him for venous disease which she has had previously. This is a serious situation and further testing is warranted to avoid limb loss. Return after his noninvasive studies.     Leotis Pain 03/29/2016, 4:36 PM   This note was created with [facility] medical dictation system.  Any errors from dictation are unintentional.

## 2016-03-29 NOTE — Assessment & Plan Note (Signed)
blood glucose control important in reducing the progression of atherosclerotic disease. Also, involved in wound healing. On appropriate medications.  

## 2016-03-29 NOTE — Assessment & Plan Note (Signed)
On anticoagulation 

## 2016-03-30 ENCOUNTER — Other Ambulatory Visit: Payer: Self-pay

## 2016-03-30 MED ORDER — GLIPIZIDE ER 5 MG PO TB24: 5.0000 mg | ORAL_TABLET | Freq: Every day | ORAL | 1 refills | Status: DC

## 2016-03-30 NOTE — Telephone Encounter (Signed)
Per lab results, pt will start glipizide xl 5mg 

## 2016-04-06 ENCOUNTER — Ambulatory Visit (INDEPENDENT_AMBULATORY_CARE_PROVIDER_SITE_OTHER): Payer: Medicare Other

## 2016-04-06 ENCOUNTER — Ambulatory Visit
Admission: RE | Admit: 2016-04-06 | Discharge: 2016-04-06 | Disposition: A | Discharge status: Home / Self Care | Payer: Medicare Other | Source: Ambulatory Visit | Attending: Otolaryngology | Admitting: Otolaryngology

## 2016-04-06 DIAGNOSIS — I4892 Unspecified atrial flutter: Secondary | ICD-10-CM | POA: Diagnosis not present

## 2016-04-06 DIAGNOSIS — Z7901 Long term (current) use of anticoagulants: Secondary | ICD-10-CM | POA: Diagnosis not present

## 2016-04-06 DIAGNOSIS — R1310 Dysphagia, unspecified: Secondary | ICD-10-CM | POA: Diagnosis not present

## 2016-04-06 DIAGNOSIS — R1313 Dysphagia, pharyngeal phase: Secondary | ICD-10-CM

## 2016-04-06 DIAGNOSIS — Z5181 Encounter for therapeutic drug level monitoring: Secondary | ICD-10-CM

## 2016-04-06 LAB — POCT INR: INR: 1.5

## 2016-04-06 NOTE — Therapy (Signed)
[place], [place], [address] Phone: [phone]   Fax:     Modified Barium Swallow  Patient Details  Name: Michael Colon MRN: [id number] Date of Birth: August 11, 1930 No Data Recorded  Encounter Date: 04/06/2016      End of Session - 04/06/16 1338    Visit Number 1   Number of Visits 1   Date for SLP Re-Evaluation 04/06/16   SLP Start Time 1248   SLP Stop Time  5465   SLP Time Calculation (min) 50 min   Activity Tolerance Patient tolerated treatment well      Past Medical History:  Diagnosis Date  . Asthma   . Atrial flutter ([place]) 2007  . Band keratopathy   . Benign prostatic hypertrophy   . Chronic ear infection   . Chronic kidney disease   . Chronic prostatitis   . Diabetes mellitus   . GERD (gastroesophageal reflux disease)    laryngeal involvement  . Hyperlipidemia   . Hypertension   . Obstructive sleep apnea    CPAP-9  . Osteoarthrosis, localized, primary, knee    post-traumatic  . Prostatitis   . Stroke [facility])    TIA  . Tachy-brady syndrome ([place])   . UTI (urinary tract infection)     Past Surgical History:  Procedure Laterality Date  . APPENDECTOMY    . BELPHAROPTOSIS REPAIR     Dr [name]---didn't resolve weepy eye and eyelid drooping  . CARDIOVERSION  04/13/2010  . CATARACT EXTRACTION, BILATERAL  2009  . Chest pain  8/12   Stress test benign  . EYE SURGERY    . FRACTURE SURGERY Left    elbow  . KNEE SURGERY  1998   plate after fracture, then removed for infection  . left elbow surgery    . MASTOIDECTOMY  8/08   Dr Idelle Crouch  . RHINOPLASTY  5/10   and septoplasty  . SUBACROMIAL DECOMPRESSION Right 2005   Arthroscopic (for rotator cuff and biceps tendon ruptures)  . TEAR DUCT PROBING  11/13   Dr Vickki Muff  . TOTAL KNEE ARTHROPLASTY Left 09/01/2014   Procedure: TOTAL KNEE ARTHROPLASTY;  Surgeon: Dereck Leep, MD;  Location: [facility] ORS;  Service: Orthopedics;   Laterality: Left;  . TRIGGER FINGER RELEASE Left 02/25/2015   Procedure: LEFT LONG TRIGGER RELEASE;  Surgeon: Dereck Leep, MD;  Location: [facility] ORS;  Service: Orthopedics;  Laterality: Left;  Marland Kitchen VENOUS ABLATION      There were no vitals filed for this visit.    Subjective: Patient behavior: (alertness, ability to follow instructions, etc.): The patient is able to state his complaints and follow directions Chief complaint: throat closing up when he bends over, dry throat in the morning causing painful/difficult swallowing, and occasional choking with food/liquid   Objective:  Radiological Procedure: A videoflouroscopic evaluation of oral-preparatory, reflex initiation, and pharyngeal phases of the swallow was performed; as well as a screening of the upper esophageal phase.  I. POSTURE: Upright in MBS chair  II. VIEW: Lateral  III. COMPENSATORY STRATEGIES: N/A  IV. BOLUSES ADMINISTERED:   Thin Liquid:  2 small sips, 3 rapid consecutive   Nectar-thick Liquid: 1 small sip    Puree: 2 teaspoon presentations   Mechanical Soft: 1/4 graham cracker in applesauce  V. RESULTS OF EVALUATION: A. ORAL PREPARATORY PHASE: (The lips, tongue, and velum are observed for strength and coordination)       **Overall Severity Rating: Within normal limits  B. SWALLOW INITIATION/REFLEX: (The reflex is normal if "triggered" by the time the bolus reached the base of the tongue)  **Overall Severity Rating: Within normal limits  C. PHARYNGEAL PHASE: (Pharyngeal function is normal if the bolus shows rapid, smooth, and continuous transit through the pharynx and there is no pharyngeal residue after the swallow)  **Overall Severity Rating: Within normal limits  D. LARYNGEAL PENETRATION: (Material entering into the laryngeal inlet/vestibule but not aspirated) None  E. ASPIRATION: None  F. ESOPHAGEAL PHASE: (Screening of the upper esophagus) A small portion of each bolus is retained within the cervical  esophagus.  An esophageal sweep showed stasis of the barium in the mid-esophagus.    ASSESSMENT: This 83 year old man; with complaints of his throat closing up when he bends over, dry throat in the morning causing painful/difficult swallowing, and occasional choking with food/liquid; is presenting with functional oropharyngeal swallowing.  Oral control of the bolus including oral hold, rotary mastication, and anterior to posterior transfer are within normal limits. Timing of the pharyngeal swallow is within normal limits.  Aspects of the pharyngeal stage of swallowing including tongue base retraction, hyolaryngeal excursion, epiglottic inversion, and duration/amplitude of UES opening are within normal limits.  There is no pharyngeal residue.  There was no observed laryngeal penetration or tracheal aspiration.  A small portion of each bolus is retained within the cervical esophagus.  An esophageal sweep showed stasis of the barium in the mid-esophagus.  The patient was counseled that his swallowing is safe; that he does not appear to be at risk for prandial aspiration.  The patient may benefit from further esophageal evaluation with a barium study.  PLAN/RECOMMENDATIONS:   A. Diet: Regular   B. Swallowing Precautions: Try to correlate foods and/or behaviors to choking   C. Recommended consultation to further evaluation of esophagus   D. Therapy recommendations N/A   E. Results and recommendations were discussed with the patient immediately following the study and the final report routed to the referring MD.    Pharyngeal dysphagia - Plan: DG OP Swallowing Func-Medicare/Speech Path, DG OP Swallowing Func-Medicare/Speech Path      G-Codes - 04-23-2016 1339    Functional Assessment Tool Used mbs, clinical judgment   Functional Limitations Swallowing   Swallow Current Status (O1308) 0 percent impaired, limited or restricted   Swallow Goal Status (M5784) 0 percent impaired, limited or restricted           Problem List Patient Active Problem List   Diagnosis Date Noted  . Swelling of limb 03/29/2016  . Lower limb ulcer, calf ([place]) 03/29/2016  . Contusion of right leg 10/09/2015  . Acute prostatitis without hematuria 07/23/2015  . Ear bleeding 06/25/2015  . Thrombocytopenia ([place]) 06/25/2015  . Obstructive sleep apnea 06/25/2015  . BPPV (benign paroxysmal positional vertigo) 12/04/2014  . Advanced directives, counseling/discussion 12/13/2013  . Encounter for therapeutic drug monitoring 01/30/2013  . Routine general medical examination at a health care facility 12/11/2012  . TIA (transient ischemic attack) 12/11/2012  . Ventricular tachycardia-pause dependent 10/16/2012  . Sinus bradycardia 09/07/2012  . Lower extremity edema 09/07/2012  . Hyperlipidemia   . Abnormal EKG 09/26/2011  . Atrial flutter ([place]) 07/21/2010  . Encounter for anticoagulation discussion and counseling 07/21/2010  . Type 2 diabetes, controlled, with neuropathy ([place])   . Tachy-brady syndrome ([place])   . BPH with obstruction/lower urinary tract symptoms   . Unspecified asthma(493.90)   . GERD (gastroesophageal reflux disease)   . Hypertension    Manuela Schwartz  [place], MS/CCC- SLP  Lou Miner 04/06/2016, 1:40 PM  [facility] [place], [place], [address] Phone: [phone]   Fax:     Name: Michael Colon MRN: [id number] Date of Birth: 1930-09-04

## 2016-04-12 ENCOUNTER — Ambulatory Visit (INDEPENDENT_AMBULATORY_CARE_PROVIDER_SITE_OTHER): Payer: Medicare Other

## 2016-04-12 ENCOUNTER — Encounter (INDEPENDENT_AMBULATORY_CARE_PROVIDER_SITE_OTHER): Payer: Self-pay | Admitting: Vascular Surgery

## 2016-04-12 ENCOUNTER — Ambulatory Visit (INDEPENDENT_AMBULATORY_CARE_PROVIDER_SITE_OTHER): Payer: Medicare Other | Admitting: Vascular Surgery

## 2016-04-12 ENCOUNTER — Encounter (INDEPENDENT_AMBULATORY_CARE_PROVIDER_SITE_OTHER): Payer: Self-pay

## 2016-04-12 VITALS — BP 136/72 | HR 53 | Resp 17 | Wt 237.0 lb

## 2016-04-12 DIAGNOSIS — E1140 Type 2 diabetes mellitus with diabetic neuropathy, unspecified: Secondary | ICD-10-CM | POA: Diagnosis not present

## 2016-04-12 DIAGNOSIS — M7989 Other specified soft tissue disorders: Secondary | ICD-10-CM | POA: Diagnosis not present

## 2016-04-12 DIAGNOSIS — I872 Venous insufficiency (chronic) (peripheral): Secondary | ICD-10-CM | POA: Insufficient documentation

## 2016-04-12 DIAGNOSIS — L97211 Non-pressure chronic ulcer of right calf limited to breakdown of skin: Secondary | ICD-10-CM

## 2016-04-12 DIAGNOSIS — I10 Essential (primary) hypertension: Secondary | ICD-10-CM

## 2016-04-12 DIAGNOSIS — I4892 Unspecified atrial flutter: Secondary | ICD-10-CM

## 2016-04-12 NOTE — Assessment & Plan Note (Signed)
His venous duplex today demonstrates no acute DVT or superficial thrombophlebitis. He does have reflux in the right great saphenous vein and small saphenous vein. He also has reflux in bilateral common femoral veins and the left popliteal vein. Given the above findings, he would benefit from laser ablation of the right great and small saphenous veins. He reduce his recurrence risk of ulceration on the right leg and help long-term pain and swelling. I discussed the risks and benefits the procedure. He voices his understanding and is agreeable to proceed.

## 2016-04-12 NOTE — Patient Instructions (Signed)

## 2016-04-12 NOTE — Assessment & Plan Note (Signed)
Likely impart at least due to venous disease. Recommend laser ablation of the right great and small saphenous vein to address his underlying venous reflux.

## 2016-04-12 NOTE — Assessment & Plan Note (Signed)
Now healed with good compression and local wound care. Plan to address his underlying venous disease to reduce recurrence risk.

## 2016-04-12 NOTE — Progress Notes (Signed)
MRN : [id number]  Michael Colon is a 83 y.o. ([birth year]) male who presents with chief complaint of  Chief Complaint  Patient presents with  . Follow-up  .  History of Present Illness: Patient returns today in follow up of leg swelling and ulceration.  His right leg ulcers have gotten healed with compression therapy, elevation, and local wound care. He still has some swelling although it has improved. His venous duplex today demonstrates no acute DVT or superficial thrombophlebitis. He does have reflux in the right great saphenous vein and small saphenous vein. He also has reflux in bilateral common femoral veins and the left popliteal vein.   Current Outpatient Prescriptions  Medication Sig Dispense Refill  . atorvastatin (LIPITOR) 80 MG tablet Take 1 tablet (80 mg total) by mouth daily. 90 tablet 3  . docusate sodium (COLACE) 100 MG capsule Take 100 mg by mouth 2 (two) times daily.     Marland Kitchen ezetimibe (ZETIA) 10 MG tablet Take 1 tablet (10 mg total) by mouth daily. 90 tablet 3  . famotidine (PEPCID) 20 MG tablet Take 20 mg by mouth daily.     . finasteride (PROSCAR) 5 MG tablet TAKE ONE TABLET BY MOUTH ONCE DAILY 90 tablet 1  . fish oil-omega-3 fatty acids 1000 MG capsule Take 1 g by mouth daily.     . Glucosamine Sulfate-MSM (GLUCOSAMINE-MSM DS) 500-500 MG TABS Take 1,500 mg by mouth daily.     Marland Kitchen glucose blood (BAYER CONTOUR TEST) test strip USE STRIP TO TEST BLOOD SUGAR TWICE DAILY AS INSTRUCTED. Dx Code: E11.40 (Patient taking differently: 1 each daily. USE STRIP TO TEST BLOOD SUGAR ONCE DAILY AS INSTRUCTED. Dx Code: E11.40) 100 each 11  . ibuprofen (ADVIL,MOTRIN) 200 MG tablet Take 600 mg by mouth 3 (three) times daily.     Marland Kitchen loratadine (CLARITIN) 10 MG tablet Take 10 mg by mouth daily.      . metFORMIN (GLUCOPHAGE) 500 MG tablet TAKE ONE TABLET BY MOUTH TWICE DAILY WITH  A  MEAL 180 tablet 3  . MICROLET LANCETS MISC Use to test twice daily or as directed 100 each 3  .  Multiple Vitamins-Minerals (CENTRUM SILVER PO) Take 1 tablet by mouth daily after breakfast.     . nitroGLYCERIN (NITROSTAT) 0.4 MG SL tablet Place 0.4 mg under the tongue every 5 (five) minutes as needed for chest pain.    . tamsulosin (FLOMAX) 0.4 MG CAPS capsule TAKE ONE CAPSULE BY MOUTH ONCE DAILY 90 capsule 3  . triamterene-hydrochlorothiazide (DYAZIDE) 37.5-25 MG capsule TAKE ONE CAPSULE BY MOUTH IN THE MORNING 90 capsule 3  . warfarin (COUMADIN) 5 MG tablet TAKE ONE TO ONE & ONE-HALF TABLETS BY MOUTH ONCE DAILY AS DIRECTED 135 tablet 1  . sulfamethoxazole-trimethoprim (BACTRIM) 400-80 MG tablet Take 1 tablet by mouth 2 (two) times daily. (Patient not taking: Reported on 03/29/2016) 14 tablet 0   No current facility-administered medications for this visit.          Past Medical History:  Diagnosis Date  . Asthma   . Atrial flutter ([place]) 2007  . Band keratopathy   . Benign prostatic hypertrophy   . Chronic ear infection   . Chronic kidney disease   . Chronic prostatitis   . Diabetes mellitus   . GERD (gastroesophageal reflux disease)    laryngeal involvement  . Hyperlipidemia   . Hypertension   . Obstructive sleep apnea    CPAP-9  . Osteoarthrosis, localized, primary, knee  post-traumatic  . Prostatitis   . Stroke [facility])    TIA  . Tachy-brady syndrome ([place])   . UTI (urinary tract infection)          Past Surgical History:  Procedure Laterality Date  . APPENDECTOMY    . BELPHAROPTOSIS REPAIR     Dr [name]---didn't resolve weepy eye and eyelid drooping  . CARDIOVERSION  04/13/2010  . CATARACT EXTRACTION, BILATERAL  2009  . Chest pain  8/12   Stress test benign  . EYE SURGERY    . FRACTURE SURGERY Left    elbow  . KNEE SURGERY  1998   plate after fracture, then removed for infection  . left elbow surgery    . MASTOIDECTOMY  8/08   Dr Idelle Crouch  . RHINOPLASTY  5/10   and septoplasty  . SUBACROMIAL  DECOMPRESSION Right 2005   Arthroscopic (for rotator cuff and biceps tendon ruptures)  . TEAR DUCT PROBING  11/13   Dr Vickki Muff  . TOTAL KNEE ARTHROPLASTY Left 09/01/2014   Procedure: TOTAL KNEE ARTHROPLASTY;  Surgeon: Dereck Leep, MD;  Location: [facility] ORS;  Service: Orthopedics;  Laterality: Left;  . TRIGGER FINGER RELEASE Left 02/25/2015   Procedure: LEFT LONG TRIGGER RELEASE;  Surgeon: Dereck Leep, MD;  Location: [facility] ORS;  Service: Orthopedics;  Laterality: Left;  Marland Kitchen VENOUS ABLATION            Family History  Problem Relation Age of Onset  . Colon cancer Father   . Diabetes Father   . Hypertension Father   . Other Mother     natural causes  . Heart attack Neg Hx   . Stroke Neg Hx     Social History       Social History  Substance Use Topics  . Smoking status: Former Smoker    Packs/day: 1.00    Years: 0.00    Types: Cigarettes    Quit date: 01/03/1969  . Smokeless tobacco: Never Used  . Alcohol use No     Comment: rare wine  Married      Allergies  Allergen Reactions  . Enoxaparin Sodium Hives  . Latex Rash  . Nickel Rash  . Percocet [Oxycodone-Acetaminophen] Rash        REVIEW OF SYSTEMS (Negative unless checked)  Constitutional: [] Weight loss  [] Fever  [] Chills Cardiac: [] Chest pain   [] Chest pressure   [] Palpitations   [] Shortness of breath when laying flat   [] Shortness of breath at rest   [] Shortness of breath with exertion. Vascular:  [] Pain in legs with walking   [] Pain in legs at rest   [] Pain in legs when laying flat   [] Claudication   [] Pain in feet when walking  [] Pain in feet at rest  [] Pain in feet when laying flat   [] History of DVT   [] Phlebitis   [x] Swelling in legs   [x] Varicose veins   [x] Non-healing ulcers Pulmonary:   [] Uses home oxygen   [] Productive cough   [] Hemoptysis   [] Wheeze  [] COPD   [] Asthma Neurologic:  [] Dizziness  [] Blackouts   [] Seizures   [] History of stroke   [] History of TIA  [] Aphasia    [] Temporary blindness   [] Dysphagia   [] Weakness or numbness in arms   [] Weakness or numbness in legs Musculoskeletal:  [x] Arthritis   [] Joint swelling   [] Joint pain   [] Low back pain Hematologic:  [] Easy bruising  [] Easy bleeding   [] Hypercoagulable state   [] Anemic  [] Hepatitis  Gastrointestinal:  [] Blood in stool   []   Vomiting blood  [] Gastroesophageal reflux/heartburn   [] Difficulty swallowing   Genitourinary:  [] Chronic kidney disease   [] Difficult urination  [] Frequent urination  [] Burning with urination   [] Blood in urine Skin:  [] Rashes   [x] Ulcers   [x] Wounds Psychological:  [] History of anxiety   []  History of major depression.   Physical Examination  BP 136/72   Pulse (!) 53   Resp 17   Wt 237 lb (107.5 kg)   BMI 33.05 kg/m  Gen:  WD/WN, NAD Head: [place]/AT, No temporalis wasting. Ear/Nose/Throat: Hearing grossly intact, nares w/o erythema or drainage, trachea midline Eyes: Conjunctiva clear. Sclera non-icteric Neck: Supple.  No JVD.  Pulmonary:  Good air movement, no use of accessory muscles.  Cardiac: RRR, normal S1, S2 Vascular:  Vessel Right Left  Radial Palpable Palpable  Ulnar Palpable Palpable  Brachial Palpable Palpable  Carotid Palpable, without bruit Palpable, without bruit  Aorta Not palpable N/A  Femoral Palpable Palpable  Popliteal Palpable Palpable  PT 1+ Palpable 1+ Palpable  DP Palpable Palpable   Gastrointestinal: soft, non-tender/non-distended. No guarding/reflex.  Musculoskeletal: M/S 5/5 throughout.  No deformity or atrophy. 1+ right lower extremity edema. 1+ left lower extremity edema with multiple healed scars from his fasciotomy. Neurologic: Sensation grossly intact in extremities.  Symmetrical.  Speech is fluent.  Psychiatric: Judgment intact, Mood & affect appropriate for pt's clinical situation. Dermatologic: Previous ulcers on the right leg have healed Lymph : No Cervical, Axillary, or Inguinal lymphadenopathy.      Labs Recent  Results (from the past 2160 hour(s))  Urinalysis, Complete     Status: Abnormal   Collection Time: 01/14/16  3:36 PM  Result Value Ref Range   Specific Gravity, UA 1.015 1.005 - 1.030   pH, UA 5.5 5.0 - 7.5   Color, UA Yellow Yellow   Appearance Ur Cloudy (A) Clear   Leukocytes, UA 1+ (A) Negative   Protein, UA Negative Negative/Trace   Glucose, UA 3+ (A) Negative   Ketones, UA Negative Negative   RBC, UA 3+ (A) Negative   Bilirubin, UA Negative Negative   Urobilinogen, Ur 0.2 0.2 - 1.0 mg/dL   Nitrite, UA Positive (A) Negative   Microscopic Examination See below:   Microscopic Examination     Status: Abnormal   Collection Time: 01/14/16  3:36 PM  Result Value Ref Range   WBC, UA 11-30 (A) 0 - 5 /hpf   RBC, UA >30 (A) 0 - 2 /hpf   Epithelial Cells (non renal) 0-10 0 - 10 /hpf   Bacteria, UA Moderate (A) None seen/Few  Bladder Scan (Post Void Residual) in office     Status: None   Collection Time: 01/14/16  3:52 PM  Result Value Ref Range   Scan Result 18   POCT INR     Status: None   Collection Time: 02/17/16  9:56 AM  Result Value Ref Range   INR 3.3   POCT INR     Status: None   Collection Time: 03/09/16 10:21 AM  Result Value Ref Range   INR 2.9   Hemoglobin A1c     Status: Abnormal   Collection Time: 03/29/16 11:14 AM  Result Value Ref Range   Hgb A1c MFr Bld 9.7 (H) 4.6 - 6.5 %    Comment: Glycemic Control Guidelines for People with Diabetes:Non Diabetic:  <6%Goal of Therapy: <7%Additional Action Suggested:  >8%   POCT INR     Status: None   Collection Time: 04/06/16 10:46 AM  Result  Value Ref Range   INR 1.5     Radiology Dg Op Swallowing Func-medicare/speech Path  Result Date: 04/06/2016 CLINICAL DATA:  Dysphagia. EXAM: MODIFIED BARIUM SWALLOW TECHNIQUE: Different consistencies of barium were administered orally to the patient by the Speech Pathologist. Imaging of the pharynx was performed in the lateral projection. FLUOROSCOPY TIME:  Fluoroscopy Time:  1  minutes 0 seconds Radiation Exposure Index (if provided by the fluoroscopic device): 4.1 mGy Number of Acquired Spot Images: Cine images COMPARISON:  No prior. FINDINGS: Thin liquid- within normal limits Nectar thick liquid- within normal limits Puree- within normal limits Pure with cracker- within normal limits IMPRESSION: Normal exam. Please refer to the Speech Pathologists report for complete details and recommendations. Electronically Signed   By: Marcello Moores  Register   On: 04/06/2016 13:39     Assessment/Plan Atrial flutter On anticoagulation  Hypertension blood pressure control important in reducing the progression of atherosclerotic disease. On appropriate oral medications.   Type 2 diabetes, controlled, with neuropathy (HCC) blood glucose control important in reducing the progression of atherosclerotic disease. Also, involved in wound healing. On appropriate medications.   Hyperlipidemia lipid control important in reducing the progression of atherosclerotic disease. Continue statin therapy  Lower limb ulcer, calf (HCC) Now healed with good compression and local wound care. Plan to address his underlying venous disease to reduce recurrence risk.  Swelling of limb Likely impart at least due to venous disease. Recommend laser ablation of the right great and small saphenous vein to address his underlying venous reflux.  Chronic venous insufficiency His venous duplex today demonstrates no acute DVT or superficial thrombophlebitis. He does have reflux in the right great saphenous vein and small saphenous vein. He also has reflux in bilateral common femoral veins and the left popliteal vein. Given the above findings, he would benefit from laser ablation of the right great and small saphenous veins. He reduce his recurrence risk of ulceration on the right leg and help long-term pain and swelling. I discussed the risks and benefits the procedure. He voices his understanding and is  agreeable to proceed.    Leotis Pain, MD  04/12/2016 4:37 PM    This note was created with Dragon medical transcription system.  Any errors from dictation are purely unintentional

## 2016-04-18 ENCOUNTER — Ambulatory Visit (INDEPENDENT_AMBULATORY_CARE_PROVIDER_SITE_OTHER): Payer: Medicare Other | Admitting: Internal Medicine

## 2016-04-18 ENCOUNTER — Telehealth: Payer: Self-pay | Admitting: Internal Medicine

## 2016-04-18 ENCOUNTER — Encounter: Payer: Self-pay | Admitting: Internal Medicine

## 2016-04-18 VITALS — BP 112/70 | HR 53 | Wt 237.0 lb

## 2016-04-18 DIAGNOSIS — G4733 Obstructive sleep apnea (adult) (pediatric): Secondary | ICD-10-CM | POA: Diagnosis not present

## 2016-04-18 DIAGNOSIS — H18423 Band keratopathy, bilateral: Secondary | ICD-10-CM | POA: Diagnosis not present

## 2016-04-18 NOTE — Telephone Encounter (Signed)
Pt calling back to let us know he is taking  fluticasone propionate Con[place] sodium nasal solution  Just wanted [place] to let the doctor know

## 2016-04-18 NOTE — Patient Instructions (Signed)
Call us in 1 week after using chin strap

## 2016-04-18 NOTE — Progress Notes (Signed)
[facility] Consultation      Date: 04/18/2016,   MRN# [id number] LINK BURGESON [birth year] Code Status:  Code Status History    Date Active Date Inactive Code Status Order ID Comments User Context   09/01/2014  1:28 PM 09/04/2014  6:52 PM Full Code [id number]  Dereck Leep, MD Inpatient     Hosp day:@LENGTHOFSTAYDAYS @ Referring MD: @ATDPROV @     PCP:      AdmissionWeight: 237 lb (107.5 kg)                 CurrentWeight: 237 lb (107.5 kg) MERYL PONDER is a 83 y.o. old male seen in consultation for sleep apnea at the request of Dr. Silvio Pate.     CHIEF COMPLAINT:   Follow up sleep apnea   HISTORY OF PRESENT ILLNESS  Patient here for follow up autocpap therapy  autoCPAP 10-16cm h20 Patient has had mask issues but no won nasal pillows and doing really well Has had to use nasal sprays but working well Compliance reports show excessive leaks still AHI score is 5.6 up from 4.5 with therapy  No other resp issues at this time PFTRatio 73% fev1 80%, flow volumes loops show concavity  No signs of infection at this time     Current Medication:  Current Outpatient Prescriptions:  .  atorvastatin (LIPITOR) 80 MG tablet, Take 1 tablet (80 mg total) by mouth daily., Disp: 90 tablet, Rfl: 3 .  docusate sodium (COLACE) 100 MG capsule, Take 100 mg by mouth 2 (two) times daily. , Disp: , Rfl:  .  ezetimibe (ZETIA) 10 MG tablet, Take 1 tablet (10 mg total) by mouth daily., Disp: 90 tablet, Rfl: 3 .  famotidine (PEPCID) 20 MG tablet, Take 20 mg by mouth daily. , Disp: , Rfl:  .  finasteride (PROSCAR) 5 MG tablet, TAKE ONE TABLET BY MOUTH ONCE DAILY, Disp: 90 tablet, Rfl: 1 .  fish oil-omega-3 fatty acids 1000 MG capsule, Take 1 g by mouth daily. , Disp: , Rfl:  .  glipiZIDE (GLUCOTROL XL) 5 MG 24 hr tablet, Take 1 tablet (5 mg total) by mouth daily with breakfast., Disp: 90 tablet, Rfl: 1 .  Glucosamine Sulfate-MSM (GLUCOSAMINE-MSM DS) 500-500 MG TABS, Take 1,500 mg  by mouth daily. , Disp: , Rfl:  .  glucose blood (BAYER CONTOUR TEST) test strip, USE STRIP TO TEST BLOOD SUGAR TWICE DAILY AS INSTRUCTED. Dx Code: E11.40 (Patient taking differently: 1 each daily. USE STRIP TO TEST BLOOD SUGAR ONCE DAILY AS INSTRUCTED. Dx Code: E11.40), Disp: 100 each, Rfl: 11 .  ibuprofen (ADVIL,MOTRIN) 200 MG tablet, Take 600 mg by mouth 3 (three) times daily. , Disp: , Rfl:  .  loratadine (CLARITIN) 10 MG tablet, Take 10 mg by mouth daily.  , Disp: , Rfl:  .  metFORMIN (GLUCOPHAGE) 500 MG tablet, TAKE ONE TABLET BY MOUTH TWICE DAILY WITH  A  MEAL, Disp: 180 tablet, Rfl: 3 .  MICROLET LANCETS MISC, Use to test twice daily or as directed, Disp: 100 each, Rfl: 3 .  Multiple Vitamins-Minerals (CENTRUM SILVER PO), Take 1 tablet by mouth daily after breakfast. , Disp: , Rfl:  .  tamsulosin (FLOMAX) 0.4 MG CAPS capsule, TAKE ONE CAPSULE BY MOUTH ONCE DAILY, Disp: 90 capsule, Rfl: 3 .  triamterene-hydrochlorothiazide (DYAZIDE) 37.5-25 MG capsule, TAKE ONE CAPSULE BY MOUTH IN THE MORNING, Disp: 90 capsule, Rfl: 3 .  warfarin (COUMADIN) 5 MG tablet, TAKE ONE TO ONE & ONE-HALF TABLETS BY  MOUTH ONCE DAILY AS DIRECTED, Disp: 135 tablet, Rfl: 1    ALLERGIES   Enoxaparin sodium; Latex; Nickel; and Percocet [oxycodone-acetaminophen]     REVIEW OF SYSTEMS   Review of Systems  Constitutional: Negative for chills, diaphoresis, fever, malaise/fatigue and weight loss.  HENT: Negative for congestion.   Respiratory: Negative for cough, hemoptysis, sputum production, shortness of breath and wheezing.   Cardiovascular: Negative for chest pain, palpitations and orthopnea.  Gastrointestinal: Negative for abdominal pain, heartburn, nausea and vomiting.  Neurological: Negative for weakness.  Endo/Heme/Allergies: Does not bruise/bleed easily.  All other systems reviewed and are negative.    VS: BP 112/70 (BP Location: Right Arm, Cuff Size: Normal)   Pulse (!) 53   Wt 237 lb (107.5 kg)    SpO2 98%   BMI 33.05 kg/m      PHYSICAL EXAM  Physical Exam  Constitutional: He is oriented to person, place, and time. He appears well-developed and well-nourished. No distress.  Cardiovascular: Normal rate, regular rhythm and normal heart sounds.   No murmur heard. Pulmonary/Chest: Effort normal and breath sounds normal. No stridor. No respiratory distress. He has no wheezes.  Musculoskeletal: Normal range of motion. He exhibits no edema.  Neurological: He is alert and oriented to person, place, and time. No cranial nerve deficit.  Skin: Skin is warm. He is not diaphoretic.  Psychiatric: He has a normal mood and affect.   CT cardiac scoring lung images reviewed LLL 3 MM nodule noted   CT ch    ASSESSMENT/PLAN   83 yo white male with long standing sleep apnea , will continue  AUTO CPAP 10-16 cm h20 and assess  Has mild intermittent SOB ,  patient is previous smoker PFT c/w mild COPD Gold Stage A  1.continue auto CPAP 10-16 cm h20 2.incidental 3 MM nodule-follow up in 6 months with CT chest 3.no inhalers at this time 4.continue nasal pillows for mask and use chin strap to assess leaks and AHI afetr 1-2 weeks    Patient  satisfied with Plan of action and management. All questions answered  Corrin Parker, M.D.  [facility]  Medical Director [place] Director [facility] Cardio-Pulmonary Department

## 2016-04-19 NOTE — Telephone Encounter (Signed)
Will make provider aware.   FYI for you DK.

## 2016-04-20 IMAGING — CT CT HEART SCORING
2 series · 16 of 20 positions shown, 18 images · non-contrast
Comparison: None.

EXAM:
OVER-READ INTERPRETATION  CT CHEST

The following report is an over-read performed by radiologist Dr.
does not include interpretation of cardiac or coronary anatomy or
pathology. The coronary calcium score interpretation by the
cardiologist is attached.
CLINICAL DATA: Risk stratification
Coronary Calcium Score
MEDICATIONS:
None
TECHNIQUE: The patient was scanned on a Siemens Somatom 64 slice scanner. Axial
non-contrast 3 mm slices were carried out through the heart. The
data set was analyzed on a dedicated work station and scored using
the Agatson method.

[Series 2: casc 3.0 i36f 2 bestdiast 72 % · axial · 0.44mm/px · z∈[-262,-169]mm · 8 of 41 slices shown, 10 images]
[im 5/41  vessel]
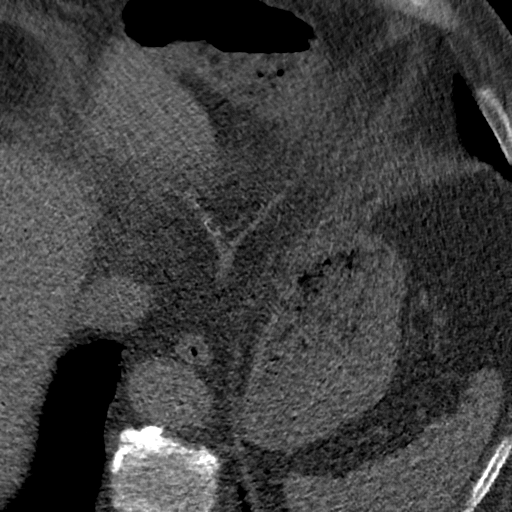
[im 5/41  lung]
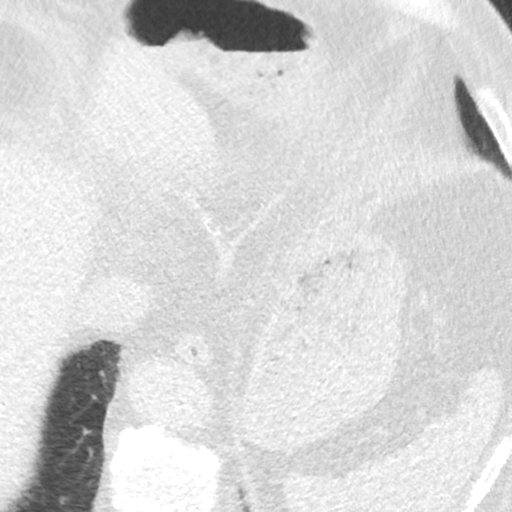
[im 9/41  vessel]
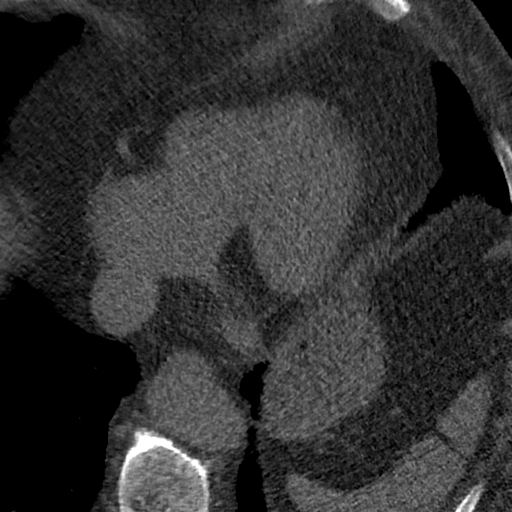
[im 14/41  vessel]
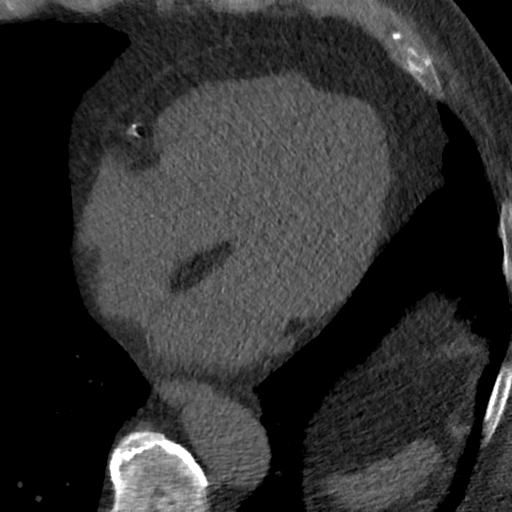
[im 18/41  vessel]
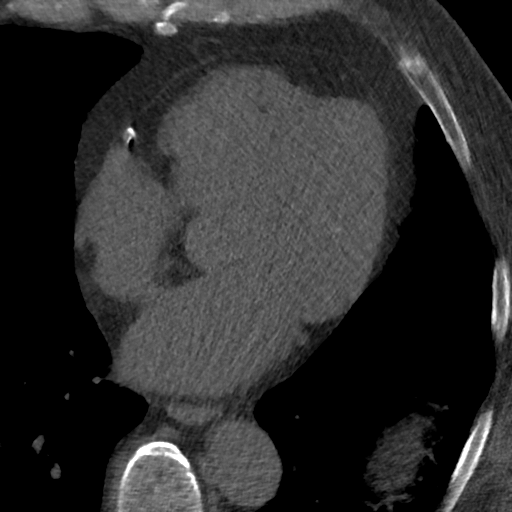
[im 23/41  vessel]
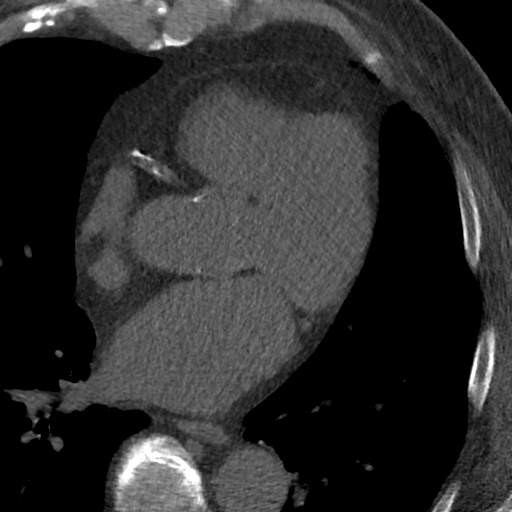
[im 23/41  lung]
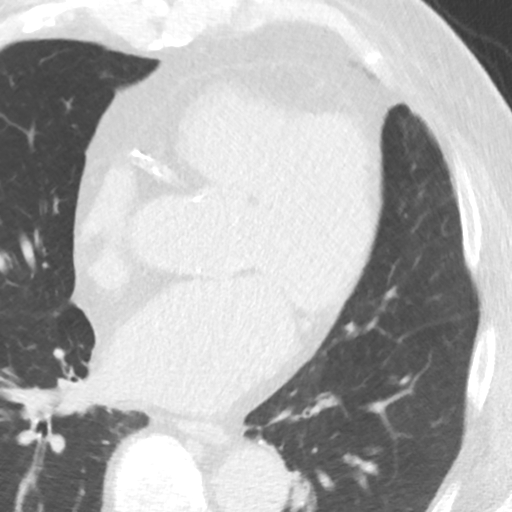
[im 27/41  vessel]
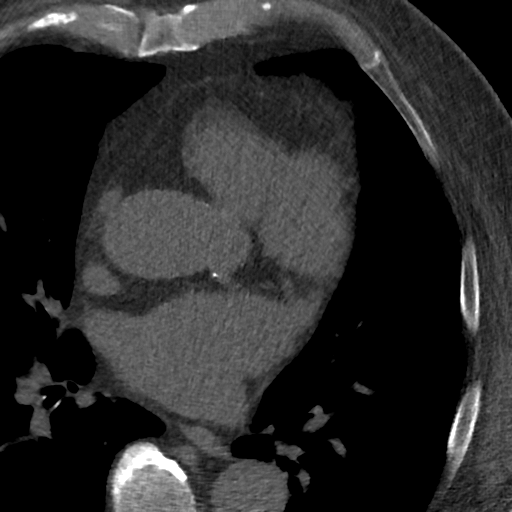
[im 32/41  vessel]
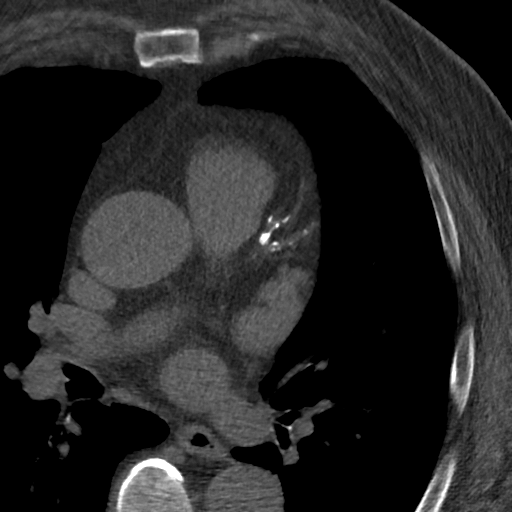
[im 36/41  vessel]
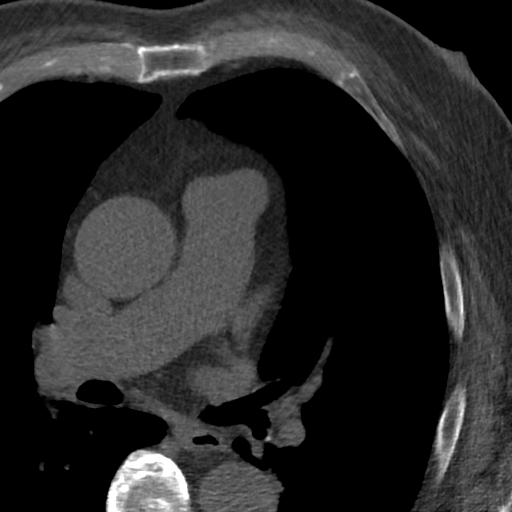

[Series 4: lung st 72 % · axial · 0.71mm/px · z∈[-262,-168]mm · 8 of 41 slices shown]
[im 5/41  lung]
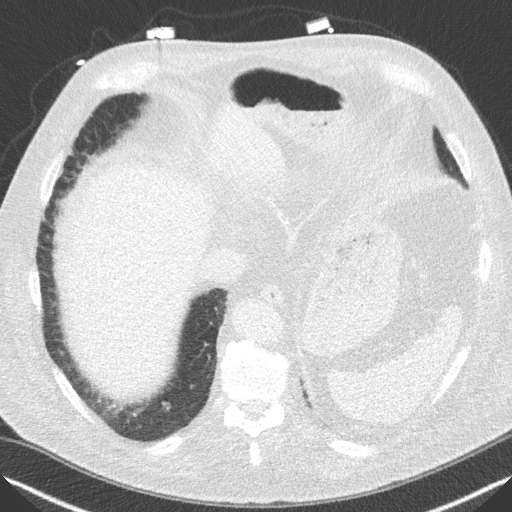
[im 9/41  lung]
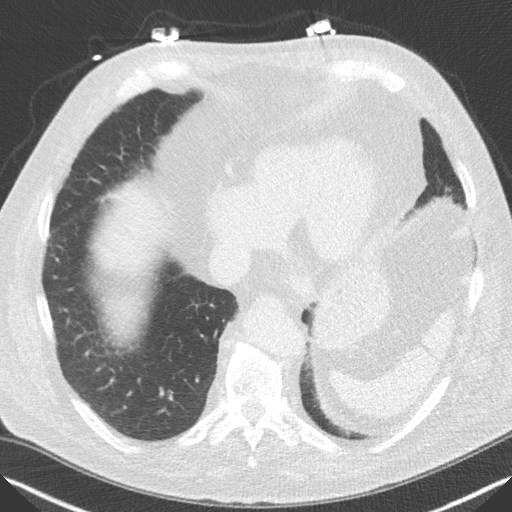
[im 14/41  lung]
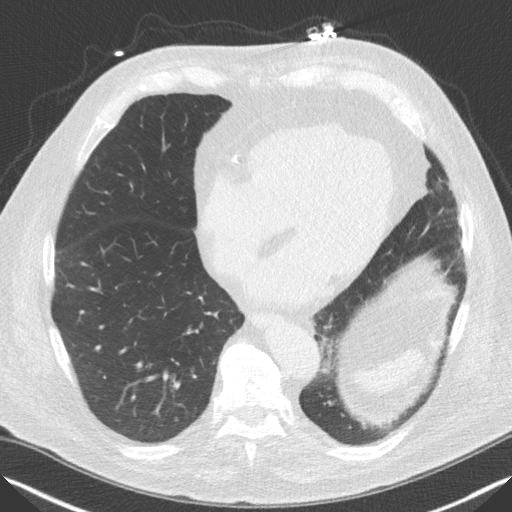
[im 18/41  lung]
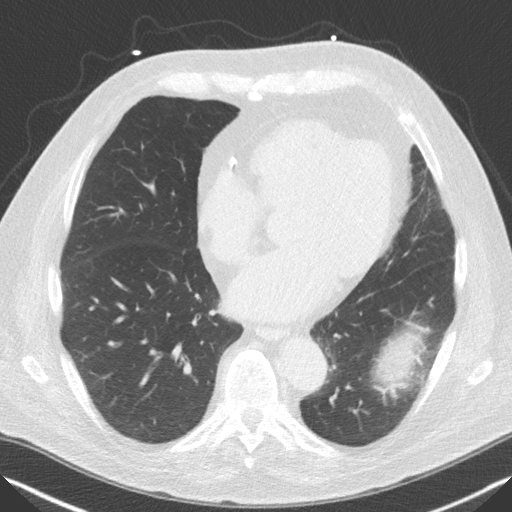
[im 23/41  lung]
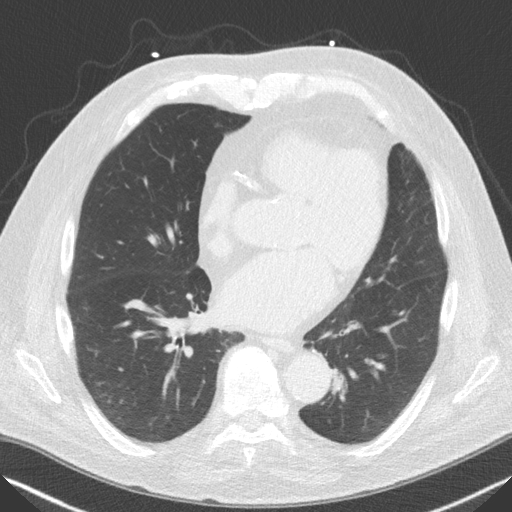
[im 27/41  lung]
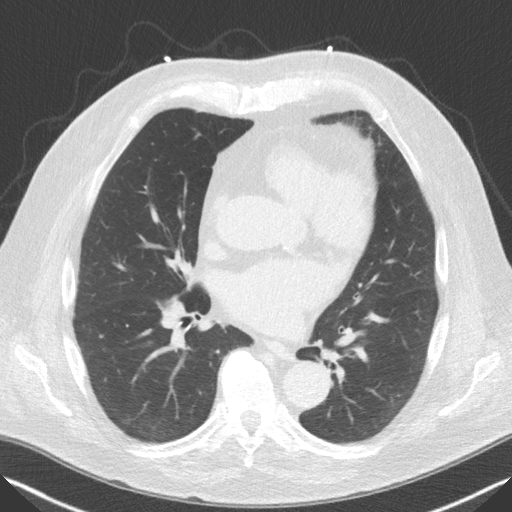
[im 32/41  lung]
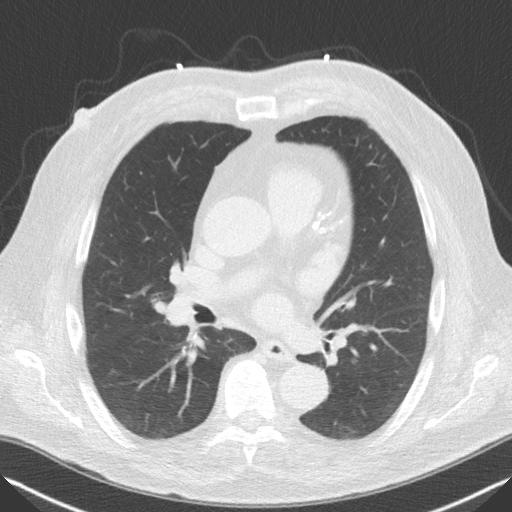
[im 36/41  lung]
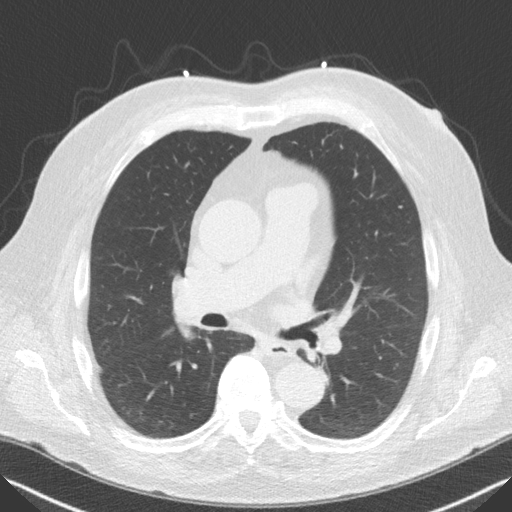

[16 of 20 positions shown; findings below may reference images not displayed]

FINDINGS: Mediastinum: No adenopathy identified. The visualized portions of
the esophagus and lower airway appear unremarkable.

Lungs/Pleura: No pleural effusion or edema identified. Mild basilar
predominant interstitial reticulation is identified. In the left
upper low there is a 2 x 4 mm nodule (3 mm mean diameter) on image 6
of series 3.

Upper Abdomen: The visualized portions of the liver and spleen are
normal. No acute findings within the upper abdomen.

Musculoskeletal: Spondylosis is noted within the thoracic spine.
IMPRESSION: 1. No acute findings.
2. 3 mm nodule is identified in the left upper lobe. No follow-up
needed if patient is low-risk. Non-contrast chest CT can be
considered in 12 months if patient is high-risk. This recommendation
follows the consensus statement: Guidelines for Management of
Incidental Pulmonary Nodules Detected on CT Images:From the
[HOSPITAL] 7129; published online before print
(10.1148/radiol.8923262652).
FINDINGS: Non-cardiac: See separate report from [REDACTED].

Ascending Aorta:  Dilated measuring 45 mm

Pericardium: Normal.

Coronary arteries:  Normal origin.  Right dominance.
IMPRESSION: 1. Coronary calcium score of 0650. This was 88 percentile for age
and sex matched control. Further ischemic work up and an aggressive
risk factor modification is recommended.

2. Dilated ascending aorta on the axial images. A dedicated chest
CTA or MRA is recommended for further evaluation.

Gloribel Ashcraft

## 2016-04-22 ENCOUNTER — Encounter (INDEPENDENT_AMBULATORY_CARE_PROVIDER_SITE_OTHER): Payer: Self-pay | Admitting: Vascular Surgery

## 2016-04-27 ENCOUNTER — Ambulatory Visit (INDEPENDENT_AMBULATORY_CARE_PROVIDER_SITE_OTHER): Payer: Medicare Other

## 2016-04-27 DIAGNOSIS — Z7901 Long term (current) use of anticoagulants: Secondary | ICD-10-CM | POA: Diagnosis not present

## 2016-04-27 DIAGNOSIS — Z5181 Encounter for therapeutic drug level monitoring: Secondary | ICD-10-CM

## 2016-04-27 DIAGNOSIS — I4892 Unspecified atrial flutter: Secondary | ICD-10-CM

## 2016-04-27 LAB — POCT INR: INR: 2.9

## 2016-04-28 ENCOUNTER — Other Ambulatory Visit: Payer: Self-pay

## 2016-04-30 NOTE — Progress Notes (Signed)
He can stop warfarin 5 days before procedure, then restart after egd He reported previous TIA in 2014 but I reviewed records and did not see clear documentation of this in epic or my notes. Should be acceptable risk to stop warfar If he prefers, he could bridge with warfarin

## 2016-05-05 ENCOUNTER — Encounter (INDEPENDENT_AMBULATORY_CARE_PROVIDER_SITE_OTHER): Payer: Self-pay

## 2016-05-06 ENCOUNTER — Other Ambulatory Visit: Payer: Self-pay

## 2016-05-06 DIAGNOSIS — R1314 Dysphagia, pharyngoesophageal phase: Secondary | ICD-10-CM

## 2016-05-09 ENCOUNTER — Telehealth: Payer: Self-pay | Admitting: Gastroenterology

## 2016-05-09 NOTE — Telephone Encounter (Signed)
Patient wants to reschedule his procedure 5/24. I believe we are waiting on clearance.Will you check up and call patient ASAP.

## 2016-05-10 ENCOUNTER — Encounter: Payer: Self-pay | Admitting: *Deleted

## 2016-05-11 ENCOUNTER — Telehealth: Payer: Self-pay | Admitting: Gastroenterology

## 2016-05-11 NOTE — Telephone Encounter (Signed)
Returned phone call to Mr. Mansouri. Spoke with his wife-explained that we have him down for colonoscopy with Dr. Allen Norris  May 24th.  They expressed concern on stopping the coumadin. I contacted Dr. Gwenyth Ober office- they had not received the fax. I refaxed electronically directly to Dr. Rockey Situ and now awaiting response.

## 2016-05-11 NOTE — Telephone Encounter (Signed)
Patient called and wants to know if we received information from Dr. Rockey Situ for his coumadin?

## 2016-05-13 ENCOUNTER — Other Ambulatory Visit: Payer: Self-pay

## 2016-05-13 ENCOUNTER — Telehealth: Payer: Self-pay | Admitting: Cardiovascular Disease

## 2016-05-13 NOTE — Telephone Encounter (Signed)
Received cardiac clearance for pt to proceed w/ colonoscopy on 05/26/16 w/ Dr. Allen Norris. Please route clearance and instructions on holding pt's coumadin to [facility] @ [phone].

## 2016-05-15 NOTE — Telephone Encounter (Signed)
Hold warfarin 5 days before surgery/colonoscopy Restart when he is completely

## 2016-05-23 ENCOUNTER — Encounter: Payer: Self-pay | Admitting: Gastroenterology

## 2016-05-23 ENCOUNTER — Ambulatory Visit (INDEPENDENT_AMBULATORY_CARE_PROVIDER_SITE_OTHER): Payer: Medicare Other | Admitting: Gastroenterology

## 2016-05-23 ENCOUNTER — Other Ambulatory Visit: Payer: Self-pay

## 2016-05-23 VITALS — BP 139/71 | HR 53 | Temp 98.0°F | Ht 71.0 in | Wt 239.0 lb

## 2016-05-23 DIAGNOSIS — R1310 Dysphagia, unspecified: Secondary | ICD-10-CM

## 2016-05-23 DIAGNOSIS — R1319 Other dysphagia: Secondary | ICD-10-CM

## 2016-05-23 NOTE — Progress Notes (Signed)
Gastroenterology Consultation  Referring Provider:     Carloyn Manner, MD Primary Care Physician:  Venia Carbon, MD Primary Gastroenterologist:  Dr. Allen Norris     Reason for Consultation:     Dysphagia        HPI:   Michael Colon is a 83 y.o. y/o male referred for consultation & management of Dysphagia by Dr. Silvio Pate, Theophilus Kinds, MD.  This patient comes in today with a report of dysphagia.  The patient was seen by ENT and sent to me for evaluation.  The patient's wife states that she was told that he had some pooling of the contrast but she is not sure who was in the esophagus or in the throat.  The patient also reports that he has had a change in his voice with a dry throat..  There is no report of any unexplained weight loss, fevers, chills, nausea or vomiting.  The patient also denies any black stools or bloody stools.  He states that he's been in his regular state of health recently and also that his upper GI symptoms have improved somewhat but have not gotten completely better.  Past Medical History:  Diagnosis Date  . Asthma   . Atrial flutter ([place]) 2007  . Band keratopathy   . Benign prostatic hypertrophy   . Chronic ear infection   . Chronic kidney disease   . Chronic prostatitis   . Dental bridge present    permanent - upper  . Diabetes mellitus   . Elbow stiffness, left    s/p fracture many yrs ago.  arm does not straighten  . GERD (gastroesophageal reflux disease)    laryngeal involvement  . Hyperlipidemia   . Hypertension   . Obstructive sleep apnea    CPAP-9  . Osteoarthrosis, localized, primary, knee    post-traumatic  . Prostatitis   . Stroke [facility] [place])    TIA  . Tachy-brady syndrome ([place])   . UTI (urinary tract infection)     Past Surgical History:  Procedure Laterality Date  . APPENDECTOMY    . BELPHAROPTOSIS REPAIR     Dr [name]---didn't resolve weepy eye and eyelid drooping  . CARDIOVERSION  04/13/2010  . CATARACT EXTRACTION, BILATERAL  2009  . Chest  pain  8/12   Stress test benign  . EYE SURGERY    . FRACTURE SURGERY Left    elbow  . KNEE SURGERY  1998   plate after fracture, then removed for infection  . left elbow surgery    . MASTOIDECTOMY  8/08   Dr Idelle Crouch  . RHINOPLASTY  5/10   and septoplasty  . SUBACROMIAL DECOMPRESSION Right 2005   Arthroscopic (for rotator cuff and biceps tendon ruptures)  . TEAR DUCT PROBING  11/13   Dr Vickki Muff  . TOTAL KNEE ARTHROPLASTY Left 09/01/2014   Procedure: TOTAL KNEE ARTHROPLASTY;  Surgeon: Dereck Leep, MD;  Location: [facility] ORS;  Service: Orthopedics;  Laterality: Left;  . TRIGGER FINGER RELEASE Left 02/25/2015   Procedure: LEFT LONG TRIGGER RELEASE;  Surgeon: Dereck Leep, MD;  Location: [facility] ORS;  Service: Orthopedics;  Laterality: Left;  Marland Kitchen VENOUS ABLATION      Prior to Admission medications   Medication Sig Start Date End Date Taking? Authorizing Provider  atorvastatin (LIPITOR) 80 MG tablet Take 1 tablet (80 mg total) by mouth daily. 09/23/15  Yes Gollan, Kathlene November, MD  docusate sodium (COLACE) 100 MG capsule Take 100 mg by mouth 2 (two) times daily.  Yes [provider]  ezetimibe (ZETIA) 10 MG tablet Take 1 tablet (10 mg total) by mouth daily. 09/23/15  Yes Minna Merritts, MD  famotidine (PEPCID) 20 MG tablet Take 20 mg by mouth daily.    Yes [provider]  finasteride (PROSCAR) 5 MG tablet TAKE ONE TABLET BY MOUTH ONCE DAILY 03/15/16  Yes Venia Carbon, MD  fish oil-omega-3 fatty acids 1000 MG capsule Take 1 g by mouth daily.    Yes [provider]  fluticasone Asencion Islam) 50 MCG/ACT nasal spray  03/10/16  Yes [provider]  glipiZIDE (GLUCOTROL XL) 5 MG 24 hr tablet Take 1 tablet (5 mg total) by mouth daily with breakfast. 03/30/16  Yes Venia Carbon, MD  Glucosamine Sulfate-MSM (GLUCOSAMINE-MSM DS) 500-500 MG TABS Take 1,500 mg by mouth daily.    Yes [provider]  glucose blood (BAYER CONTOUR TEST) test strip USE STRIP TO  TEST BLOOD SUGAR TWICE DAILY AS INSTRUCTED. Dx Code: E11.40 Patient taking differently: 1 each daily. USE STRIP TO TEST BLOOD SUGAR ONCE DAILY AS INSTRUCTED. Dx Code: E11.40 12/11/15  Yes Venia Carbon, MD  ibuprofen (ADVIL,MOTRIN) 200 MG tablet Take 600 mg by mouth 3 (three) times daily.    Yes [provider]  loratadine (CLARITIN) 10 MG tablet Take 10 mg by mouth daily.     Yes [provider]  metFORMIN (GLUCOPHAGE) 500 MG tablet TAKE ONE TABLET BY MOUTH TWICE DAILY WITH  A  MEAL 01/01/16  Yes Venia Carbon, MD  MICROLET LANCETS MISC Use to test twice daily or as directed 09/06/11  Yes Venia Carbon, MD  Multiple Vitamins-Minerals (CENTRUM SILVER PO) Take 1 tablet by mouth daily after breakfast.    Yes [provider]  triamterene-hydrochlorothiazide (DYAZIDE) 37.5-25 MG capsule TAKE ONE CAPSULE BY MOUTH IN THE MORNING 11/11/15  Yes Gollan, Kathlene November, MD  warfarin (COUMADIN) 5 MG tablet TAKE ONE TO ONE & ONE-HALF TABLETS BY MOUTH ONCE DAILY AS DIRECTED 01/07/16  Yes Gollan, Kathlene November, MD  neomycin-polymyxin b-dexamethasone (MAXITROL) 3.5-10000-0.1 SUSP  03/25/16   [provider]  tamsulosin (FLOMAX) 0.4 MG CAPS capsule TAKE ONE CAPSULE BY MOUTH ONCE DAILY Patient not taking: Reported on 05/23/2016 12/21/15   Venia Carbon, MD    Family History  Problem Relation Age of Onset  . Colon cancer Father   . Diabetes Father   . Hypertension Father   . Other Mother        natural causes  . Heart attack Neg Hx   . Stroke Neg Hx      Social History  Substance Use Topics  . Smoking status: Former Smoker    Packs/day: 1.00    Years: 0.00    Types: Cigarettes    Quit date: 01/03/1969  . Smokeless tobacco: Never Used  . Alcohol use No     Comment: rare wine. 1-2x/yr.    Allergies as of 05/23/2016 - Review Complete 05/23/2016  Allergen Reaction Noted  . Enoxaparin sodium Hives 06/25/2010  . Latex Rash 06/25/2010  . Nickel Rash 09/01/2014  .  Percocet [oxycodone-acetaminophen] Rash 06/25/2010    Review of Systems:    All systems reviewed and negative except where noted in HPI.   Physical Exam:  BP 139/71   Pulse (!) 53   Temp 98 F (36.7 C) (Oral)   Ht 5\' 11"  (1.803 m)   Wt 239 lb (108.4 kg)   BMI 33.33 kg/m  No LMP for male  patient. Psych:  Alert and cooperative. Normal mood and affect. General:   Alert,  Well-developed, well-nourished, pleasant and cooperative in NAD Head:  Normocephalic and atraumatic. Eyes:  Sclera clear, no icterus.   Conjunctiva pink. Ears:  Normal auditory acuity. Nose:  No deformity, discharge, or lesions. Mouth:  No deformity or lesions,oropharynx pink & moist. Neck:  Supple; no masses or thyromegaly. Lungs:  Respirations even and unlabored.  Clear throughout to auscultation.   No wheezes, crackles, or rhonchi. No acute distress. Heart:  Regular rate and rhythm; no murmurs, clicks, rubs, or gallops. Abdomen:  Normal bowel sounds.  No bruits.  Soft, non-tender and non-distended without masses, hepatosplenomegaly or hernias noted.  No guarding or rebound tenderness.  Negative Carnett sign.   Rectal:  Deferred.  Msk:  Symmetrical without gross deformities.  Good, equal movement & strength bilaterally. Pulses:  Normal pulses noted. Extremities:  No clubbing or edema.  No cyanosis. Neurologic:  Alert and oriented x3;  grossly normal neurologically. Skin:  Intact without significant lesions or rashes.  No jaundice. Lymph Nodes:  No significant cervical adenopathy. Psych:  Alert and cooperative. Normal mood and affect.  Imaging Studies: No results found.  Assessment and Plan:   Michael Colon is a 83 y.o. y/o male who has a history of dysphagia and was sent to me from ENT for evaluation.  The patient will be set up for an upper endoscopy.  This will be done to look for any strictures or narrowing that may be causing his symptoms and we will also look for any signs of reflux as a cause of his  changes in his voice. I have discussed risks & benefits which include, but are not limited to, bleeding, infection, perforation & drug reaction.  The patient agrees with this plan & written consent will be obtained.       Lucilla Lame, MD. Marval Regal   Note: This dictation was prepared with Dragon dictation along with smaller phrase technology. Any transcriptional errors that result from this process are unintentional.

## 2016-05-25 ENCOUNTER — Ambulatory Visit (INDEPENDENT_AMBULATORY_CARE_PROVIDER_SITE_OTHER): Payer: Medicare Other

## 2016-05-25 DIAGNOSIS — I4892 Unspecified atrial flutter: Secondary | ICD-10-CM

## 2016-05-25 DIAGNOSIS — Z7901 Long term (current) use of anticoagulants: Secondary | ICD-10-CM

## 2016-05-25 DIAGNOSIS — Z5181 Encounter for therapeutic drug level monitoring: Secondary | ICD-10-CM | POA: Diagnosis not present

## 2016-05-25 LAB — POCT INR: INR: 1.3

## 2016-05-25 NOTE — Discharge Instructions (Signed)

## 2016-05-26 ENCOUNTER — Ambulatory Visit: Payer: Medicare Other | Admitting: Anesthesiology

## 2016-05-26 ENCOUNTER — Ambulatory Visit
Admission: RE | Admit: 2016-05-26 | Discharge: 2016-05-26 | Disposition: A | Discharge status: Home / Self Care | Payer: Medicare Other | Source: Ambulatory Visit | Attending: Gastroenterology | Admitting: Gastroenterology

## 2016-05-26 ENCOUNTER — Ambulatory Visit
Admission: RE | Disposition: A | Discharge status: Home / Self Care | Payer: Self-pay | Source: Ambulatory Visit | Attending: Gastroenterology

## 2016-05-26 DIAGNOSIS — Z791 Long term (current) use of non-steroidal anti-inflammatories (NSAID): Secondary | ICD-10-CM | POA: Insufficient documentation

## 2016-05-26 DIAGNOSIS — Z96652 Presence of left artificial knee joint: Secondary | ICD-10-CM | POA: Diagnosis not present

## 2016-05-26 DIAGNOSIS — Z7901 Long term (current) use of anticoagulants: Secondary | ICD-10-CM | POA: Diagnosis not present

## 2016-05-26 DIAGNOSIS — Z8673 Personal history of transient ischemic attack (TIA), and cerebral infarction without residual deficits: Secondary | ICD-10-CM | POA: Diagnosis not present

## 2016-05-26 DIAGNOSIS — I4892 Unspecified atrial flutter: Secondary | ICD-10-CM | POA: Diagnosis not present

## 2016-05-26 DIAGNOSIS — Z87891 Personal history of nicotine dependence: Secondary | ICD-10-CM | POA: Diagnosis not present

## 2016-05-26 DIAGNOSIS — K219 Gastro-esophageal reflux disease without esophagitis: Secondary | ICD-10-CM | POA: Insufficient documentation

## 2016-05-26 DIAGNOSIS — N400 Enlarged prostate without lower urinary tract symptoms: Secondary | ICD-10-CM | POA: Diagnosis not present

## 2016-05-26 DIAGNOSIS — Z79899 Other long term (current) drug therapy: Secondary | ICD-10-CM | POA: Diagnosis not present

## 2016-05-26 DIAGNOSIS — Z7984 Long term (current) use of oral hypoglycemic drugs: Secondary | ICD-10-CM | POA: Insufficient documentation

## 2016-05-26 DIAGNOSIS — I129 Hypertensive chronic kidney disease with stage 1 through stage 4 chronic kidney disease, or unspecified chronic kidney disease: Secondary | ICD-10-CM | POA: Diagnosis not present

## 2016-05-26 DIAGNOSIS — E785 Hyperlipidemia, unspecified: Secondary | ICD-10-CM | POA: Diagnosis not present

## 2016-05-26 DIAGNOSIS — E1122 Type 2 diabetes mellitus with diabetic chronic kidney disease: Secondary | ICD-10-CM | POA: Insufficient documentation

## 2016-05-26 DIAGNOSIS — G4733 Obstructive sleep apnea (adult) (pediatric): Secondary | ICD-10-CM | POA: Diagnosis not present

## 2016-05-26 DIAGNOSIS — N189 Chronic kidney disease, unspecified: Secondary | ICD-10-CM | POA: Insufficient documentation

## 2016-05-26 DIAGNOSIS — R1310 Dysphagia, unspecified: Secondary | ICD-10-CM | POA: Diagnosis not present

## 2016-05-26 HISTORY — DX: Presence of dental prosthetic device (complete) (partial): Z97.2

## 2016-05-26 HISTORY — PX: ESOPHAGEAL DILATION: SHX303

## 2016-05-26 HISTORY — DX: Stiffness of left elbow, not elsewhere classified: M25.622

## 2016-05-26 HISTORY — PX: ESOPHAGOGASTRODUODENOSCOPY (EGD) WITH PROPOFOL: SHX5813

## 2016-05-26 LAB — GLUCOSE, CAPILLARY
Glucose-Capillary: 108 mg/dL — ABNORMAL HIGH (ref 65–99)
Glucose-Capillary: 128 mg/dL — ABNORMAL HIGH (ref 65–99)

## 2016-05-26 SURGERY — ESOPHAGOGASTRODUODENOSCOPY (EGD) WITH PROPOFOL
Anesthesia: Choice | Wound class: Clean Contaminated

## 2016-05-26 MED ORDER — LACTATED RINGERS IV SOLN
10.0000 mL/h | INTRAVENOUS | Status: DC
  Administered 2016-05-26: 10 mL/h via INTRAVENOUS

## 2016-05-26 MED ORDER — PROPOFOL 10 MG/ML IV BOLUS
INTRAVENOUS | Status: DC | PRN
  Administered 2016-05-26: 20 mg via INTRAVENOUS
  Administered 2016-05-26 (×2): 50 mg via INTRAVENOUS

## 2016-05-26 MED ORDER — LACTATED RINGERS IV SOLN
INTRAVENOUS | Status: DC | PRN
  Administered 2016-05-26: 08:00:00 via INTRAVENOUS

## 2016-05-26 MED ORDER — ONDANSETRON HCL 4 MG/2ML IJ SOLN: 4.0000 mg | Freq: Once | INTRAMUSCULAR | Status: DC | PRN

## 2016-05-26 MED ORDER — LIDOCAINE HCL (CARDIAC) 20 MG/ML IV SOLN
INTRAVENOUS | Status: DC | PRN
  Administered 2016-05-26: 50 mg via INTRAVENOUS

## 2016-05-26 MED ORDER — GLYCOPYRROLATE 0.2 MG/ML IJ SOLN
INTRAMUSCULAR | Status: DC | PRN
  Administered 2016-05-26: 0.2 mg via INTRAVENOUS

## 2016-05-26 SURGICAL SUPPLY — 32 items
BALLN DILATOR 10-12 8 (BALLOONS)
BALLN DILATOR 12-15 8 (BALLOONS)
BALLN DILATOR 15-18 8 (BALLOONS) ×3
BALLN DILATOR CRE 0-12 8 (BALLOONS)
BALLN DILATOR ESOPH 8 10 CRE (MISCELLANEOUS) IMPLANT
BALLOON DILATOR 12-15 8 (BALLOONS) IMPLANT
BALLOON DILATOR 15-18 8 (BALLOONS) ×2 IMPLANT
BALLOON DILATOR CRE 0-12 8 (BALLOONS) IMPLANT
BLOCK BITE 60FR ADLT L/F GRN (MISCELLANEOUS) ×3 IMPLANT
CANISTER SUCT 1200ML W/VALVE (MISCELLANEOUS) ×3 IMPLANT
CLIP HMST 235XBRD CATH ROT (MISCELLANEOUS) IMPLANT
CLIP RESOLUTION 360 11X235 (MISCELLANEOUS)
FCP ESCP3.2XJMB 240X2.8X (MISCELLANEOUS)
FORCEPS BIOP RAD 4 LRG CAP 4 (CUTTING FORCEPS) IMPLANT
FORCEPS BIOP RJ4 240 W/NDL (MISCELLANEOUS)
FORCEPS ESCP3.2XJMB 240X2.8X (MISCELLANEOUS) IMPLANT
GOWN CVR UNV OPN BCK APRN NK (MISCELLANEOUS) ×4 IMPLANT
GOWN ISOL THUMB LOOP REG UNIV (MISCELLANEOUS) ×2
INJECTOR VARIJECT VIN23 (MISCELLANEOUS) IMPLANT
KIT DEFENDO VALVE AND CONN (KITS) IMPLANT
KIT ENDO PROCEDURE OLY (KITS) ×3 IMPLANT
MARKER SPOT ENDO TATTOO 5ML (MISCELLANEOUS) IMPLANT
PAD GROUND ADULT SPLIT (MISCELLANEOUS) IMPLANT
RETRIEVER NET PLAT FOOD (MISCELLANEOUS) IMPLANT
SNARE SHORT THROW 13M SML OVAL (MISCELLANEOUS) IMPLANT
SNARE SHORT THROW 30M LRG OVAL (MISCELLANEOUS) IMPLANT
SPOT EX ENDOSCOPIC TATTOO (MISCELLANEOUS)
SYR INFLATION 60ML (SYRINGE) ×3 IMPLANT
TRAP ETRAP POLY (MISCELLANEOUS) IMPLANT
VARIJECT INJECTOR VIN23 (MISCELLANEOUS)
WATER STERILE IRR 250ML POUR (IV SOLUTION) ×3 IMPLANT
WIRE CRE 18-20MM 8CM F G (MISCELLANEOUS) IMPLANT

## 2016-05-26 NOTE — Op Note (Signed)
[facility] Patient Name: Michael Colon Procedure Date: 05/26/2016 7:33 AM MRN: [id number] Account #: [id number] Date of Birth: 1930/02/13 Admit Type: Outpatient Age: 83 Room: [facility] OR ROOM 01 Gender: Male Note Status: Finalized Procedure:            Upper GI endoscopy Indications:          Dysphagia Providers:            Lucilla Lame MD, MD Referring MD:         Venia Carbon (Referring MD) Medicines:            Propofol per Anesthesia Complications:        No immediate complications. Procedure:            Pre-Anesthesia Assessment:                       - Prior to the procedure, a History and Physical was                        performed, and patient medications and allergies were                        reviewed. The patient's tolerance of previous                        anesthesia was also reviewed. The risks and benefits of                        the procedure and the sedation options and risks were                        discussed with the patient. All questions were                        answered, and informed consent was obtained. Prior                        Anticoagulants: The patient has taken no previous                        anticoagulant or antiplatelet agents. ASA Grade                        Assessment: II - A patient with mild systemic disease.                        After reviewing the risks and benefits, the patient was                        deemed in satisfactory condition to undergo the                        procedure.                       After obtaining informed consent, the endoscope was                        passed under direct vision. Throughout the procedure,  the patient's blood pressure, pulse, and oxygen                        saturations were monitored continuously. The Olympus                        [id number]-HQ190 Endoscope (S#. [id number]) was introduced                        through the  mouth, and advanced to the second part of                        duodenum. The upper GI endoscopy was accomplished                        without difficulty. The patient tolerated the procedure                        well. Findings:      The examined esophagus was normal. A TTS dilator was passed through the       scope. Dilation with a 15-16.5-18 mm balloon dilator was performed to 18       mm. The dilation site was examined following endoscope reinsertion and       showed no change.      The stomach was normal.      The examined duodenum was normal. Impression:           - Normal esophagus. Dilated.                       - Normal stomach.                       - Normal examined duodenum.                       - No specimens collected. Recommendation:       - Discharge patient to home.                       - Resume previous diet.                       - Continue present medications. Procedure Code(s):    --- Professional ---                       [id number], Esophagogastroduodenoscopy, flexible, transoral;                        with transendoscopic balloon dilation of esophagus                        (less than 30 mm diameter) Diagnosis Code(s):    --- Professional ---                       R13.10, Dysphagia, unspecified CPT copyright 2016 American Medical Association. All rights reserved. The codes documented in this report are preliminary and upon coder review may  be revised to meet current compliance requirements. Lucilla Lame MD, MD 05/26/2016 8:12:12 AM This report has been signed electronically. Number of Addenda: 0 Note Initiated On: 05/26/2016 7:33 AM  [facility]

## 2016-05-26 NOTE — Anesthesia Preprocedure Evaluation (Signed)
Anesthesia Evaluation  Patient identified by MRN, date of birth, ID band Patient awake    Reviewed: Allergy & Precautions, NPO status   History of Anesthesia Complications (+) PROLONGED EMERGENCE  Airway Mallampati: III  TM Distance: >3 FB     Dental  (+) Dental Advisory Given Upper bridge:   Pulmonary asthma , sleep apnea and Continuous Positive Airway Pressure Ventilation , former smoker,    breath sounds clear to auscultation       Cardiovascular hypertension, Pt. on medications  Rhythm:Regular Rate:Normal  Tachy/brady syndrome - did not qualify for pacemaker  Last coumadin dose Friday 5/17. INR 1.3 on 5/23.  NM perfusion test 04/2015: Pharmacological myocardial perfusion imaging study with no significant  ischemia Normal wall motion, EF estimated at 67% No EKG changes concerning for ischemia at peak stress or in recovery. Low risk scan   Neuro/Psych TIACVA    GI/Hepatic Neg liver ROS, GERD  Controlled,  Endo/Other  diabetes, Well Controlled, Type 2BMI 36  Renal/GU Renal InsufficiencyRenal disease   BPH    Musculoskeletal  (+) Arthritis , Osteoarthritis,    Abdominal   Peds  Hematology   Anesthesia Other Findings   Reproductive/Obstetrics                             Anesthesia Physical  Anesthesia Plan  ASA: III  Anesthesia Plan:    Post-op Pain Management:    Induction: Intravenous  Airway Management Planned: Natural Airway  Additional Equipment:   Intra-op Plan:   Post-operative Plan:   Informed Consent: I have reviewed the patients History and Physical, chart, labs and discussed the procedure including the risks, benefits and alternatives for the proposed anesthesia with the patient or authorized representative who has indicated his/her understanding and acceptance.   Dental advisory given (Patient very concerned about expensive upper bridge. I made him aware a  bite block would be placed before induction.)  Plan Discussed with: CRNA  Anesthesia Plan Comments:         Anesthesia Quick Evaluation

## 2016-05-26 NOTE — Anesthesia Postprocedure Evaluation (Signed)
Anesthesia Post Note  Patient: Michael Colon  Procedure(s) Performed: Procedure(s) (LRB): ESOPHAGOGASTRODUODENOSCOPY (EGD) WITH PROPOFOL (N/A) ESOPHAGEAL DILATION  Patient location during evaluation: PACU Anesthesia Type: General Level of consciousness: awake and alert Pain management: pain level controlled Vital Signs Assessment: post-procedure vital signs reviewed and stable Respiratory status: spontaneous breathing, nonlabored ventilation and respiratory function stable Cardiovascular status: stable Postop Assessment: no signs of nausea or vomiting Anesthetic complications: no    Veda Canning

## 2016-05-26 NOTE — H&P (Signed)
Michael Lame, MD [address] [place], [place] [address] [phone] Fax : [phone]  Primary Care Physician:  Venia Carbon, MD Primary Gastroenterologist:  Dr. Allen Norris  Pre-Procedure History & Physical: HPI:  Michael Colon is a 83 y.o. male is here for an endoscopy.   Past Medical History:  Diagnosis Date  . Asthma   . Atrial flutter ([place]) 2007  . Band keratopathy   . Benign prostatic hypertrophy   . Chronic ear infection   . Chronic kidney disease   . Chronic prostatitis   . Dental bridge present    permanent - upper  . Diabetes mellitus   . Elbow stiffness, left    s/p fracture many yrs ago.  arm does not straighten  . GERD (gastroesophageal reflux disease)    laryngeal involvement  . Hyperlipidemia   . Hypertension   . Obstructive sleep apnea    CPAP-9  . Osteoarthrosis, localized, primary, knee    post-traumatic  . Prostatitis   . Stroke [facility])    TIA  . Tachy-brady syndrome ([place])   . UTI (urinary tract infection)     Past Surgical History:  Procedure Laterality Date  . APPENDECTOMY    . BELPHAROPTOSIS REPAIR     Dr [name]---didn't resolve weepy eye and eyelid drooping  . CARDIOVERSION  04/13/2010  . CATARACT EXTRACTION, BILATERAL  2009  . Chest pain  8/12   Stress test benign  . EYE SURGERY    . FRACTURE SURGERY Left    elbow  . KNEE SURGERY  1998   plate after fracture, then removed for infection  . left elbow surgery    . MASTOIDECTOMY  8/08   Dr Idelle Crouch  . RHINOPLASTY  5/10   and septoplasty  . SUBACROMIAL DECOMPRESSION Right 2005   Arthroscopic (for rotator cuff and biceps tendon ruptures)  . TEAR DUCT PROBING  11/13   Dr Vickki Muff  . TOTAL KNEE ARTHROPLASTY Left 09/01/2014   Procedure: TOTAL KNEE ARTHROPLASTY;  Surgeon: Dereck Leep, MD;  Location: [facility] ORS;  Service: Orthopedics;  Laterality: Left;  . TRIGGER FINGER RELEASE Left 02/25/2015   Procedure: LEFT LONG TRIGGER RELEASE;  Surgeon: Dereck Leep, MD;   Location: [facility] ORS;  Service: Orthopedics;  Laterality: Left;  Marland Kitchen VENOUS ABLATION      Prior to Admission medications   Medication Sig Start Date End Date Taking? Authorizing Provider  atorvastatin (LIPITOR) 80 MG tablet Take 1 tablet (80 mg total) by mouth daily. 09/23/15  Yes Gollan, Kathlene November, MD  docusate sodium (COLACE) 100 MG capsule Take 100 mg by mouth 2 (two) times daily.    Yes [provider]  ezetimibe (ZETIA) 10 MG tablet Take 1 tablet (10 mg total) by mouth daily. 09/23/15  Yes Minna Merritts, MD  finasteride (PROSCAR) 5 MG tablet TAKE ONE TABLET BY MOUTH ONCE DAILY 03/15/16  Yes Venia Carbon, MD  fish oil-omega-3 fatty acids 1000 MG capsule Take 1 g by mouth daily.    Yes [provider]  Glucosamine Sulfate-MSM (GLUCOSAMINE-MSM DS) 500-500 MG TABS Take 1,500 mg by mouth daily.    Yes [provider]  ibuprofen (ADVIL,MOTRIN) 200 MG tablet Take 600 mg by mouth 3 (three) times daily.    Yes [provider]  loratadine (CLARITIN) 10 MG tablet Take 10 mg by mouth daily.     Yes [provider]  Multiple Vitamins-Minerals (CENTRUM SILVER PO) Take 1 tablet by mouth daily after breakfast.  Yes [provider]  warfarin (COUMADIN) 5 MG tablet TAKE ONE TO ONE & ONE-HALF TABLETS BY MOUTH ONCE DAILY AS DIRECTED 01/07/16  Yes Gollan, Kathlene November, MD  famotidine (PEPCID) 20 MG tablet Take 20 mg by mouth daily.     [provider]  fluticasone Asencion Islam) 50 MCG/ACT nasal spray  03/10/16   [provider]  glipiZIDE (GLUCOTROL XL) 5 MG 24 hr tablet Take 1 tablet (5 mg total) by mouth daily with breakfast. 03/30/16   [name] I, MD  glucose blood (BAYER CONTOUR TEST) test strip USE STRIP TO TEST BLOOD SUGAR TWICE DAILY AS INSTRUCTED. Dx Code: E11.40 Patient taking differently: 1 each daily. USE STRIP TO TEST BLOOD SUGAR ONCE DAILY AS INSTRUCTED. Dx Code: E11.40 12/11/15   Venia Carbon, MD  metFORMIN (GLUCOPHAGE) 500  MG tablet TAKE ONE TABLET BY MOUTH TWICE DAILY WITH  A  MEAL 01/01/16   Venia Carbon, MD  MICROLET LANCETS MISC Use to test twice daily or as directed 09/06/11   Venia Carbon, MD  neomycin-polymyxin b-dexamethasone (MAXITROL) 3.5-10000-0.1 SUSP  03/25/16   [provider]  tamsulosin (FLOMAX) 0.4 MG CAPS capsule TAKE ONE CAPSULE BY MOUTH ONCE DAILY Patient not taking: Reported on 05/23/2016 12/21/15   Venia Carbon, MD  triamterene-hydrochlorothiazide (DYAZIDE) 37.5-25 MG capsule TAKE ONE CAPSULE BY MOUTH IN THE MORNING 11/11/15   Minna Merritts, MD    Allergies as of 05/06/2016 - Review Complete 04/18/2016  Allergen Reaction Noted  . Enoxaparin sodium Hives 06/25/2010  . Latex Rash 06/25/2010  . Nickel Rash 09/01/2014  . Percocet [oxycodone-acetaminophen] Rash 06/25/2010    Family History  Problem Relation Age of Onset  . Colon cancer Father   . Diabetes Father   . Hypertension Father   . Other Mother        natural causes  . Heart attack Neg Hx   . Stroke Neg Hx     Social History   Social History  . Marital status: Married    Spouse name: N/A  . Number of children: 2  . 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 Smoking status: Former Smoker    Packs/day: 1.00    Years: 0.00    Types: Cigarettes    Quit date: 01/03/1969  . Smokeless tobacco: Never Used  . Alcohol use No     Comment: rare wine. 1-2x/yr.  . Drug use: No  . Sexual activity: Not on file   Other Topics Concern  . Not on file   Social History Narrative   Has living will   Health care POA-- wife and then son Michael Colon   Has DNR order from the past---form redone 06/25/10   Probably no feeding tube if cognitively unaware    Review of Systems: See HPI, otherwise negative ROS  Physical Exam: BP (!) 151/85   Pulse (!) 52   Temp 97.3 F (36.3 C) (Temporal)   Ht 5\' 11"  (1.803 m)   Wt 233 lb (105.7 kg)   SpO2  100%   BMI 32.50 kg/m  General:   Alert,  pleasant and cooperative in NAD Head:  Normocephalic and atraumatic. Neck:  Supple; no masses or thyromegaly. Lungs:  Clear throughout to auscultation.    Heart:  Regular rate and rhythm. Abdomen:  Soft, nontender and nondistended. Normal bowel sounds, without guarding, and without rebound.   Neurologic:  Alert and  oriented x4;  grossly normal neurologically.  Impression/Plan: KENAI FLUEGEL is here for an endoscopy to be performed for dysphagia  Risks, benefits, limitations, and alternatives regarding  endoscopy have been reviewed with the patient.  Questions have been answered.  All parties agreeable.   Michael Lame, MD  05/26/2016, 7:52 AM

## 2016-05-26 NOTE — Transfer of Care (Signed)
Immediate Anesthesia Transfer of Care Note  Patient: Michael Colon  Procedure(s) Performed: Procedure(s) with comments: ESOPHAGOGASTRODUODENOSCOPY (EGD) WITH PROPOFOL (N/A) - Diabetic - oral meds sleep apnea ESOPHAGEAL DILATION  Patient Location: PACU  Anesthesia Type: No value filed.  Level of Consciousness: awake, alert  and patient cooperative  Airway and Oxygen Therapy: Patient Spontanous Breathing and Patient connected to supplemental oxygen  Post-op Assessment: Post-op Vital signs reviewed, Patient's Cardiovascular Status Stable, Respiratory Function Stable, Patent Airway and No signs of Nausea or vomiting  Post-op Vital Signs: Reviewed and stable  Complications: No apparent anesthesia complications

## 2016-06-06 DIAGNOSIS — B351 Tinea unguium: Secondary | ICD-10-CM | POA: Diagnosis not present

## 2016-06-06 DIAGNOSIS — L851 Acquired keratosis [keratoderma] palmaris et plantaris: Secondary | ICD-10-CM | POA: Diagnosis not present

## 2016-06-06 DIAGNOSIS — E1142 Type 2 diabetes mellitus with diabetic polyneuropathy: Secondary | ICD-10-CM | POA: Diagnosis not present

## 2016-06-07 ENCOUNTER — Telehealth: Payer: Self-pay

## 2016-06-07 ENCOUNTER — Encounter (INDEPENDENT_AMBULATORY_CARE_PROVIDER_SITE_OTHER): Payer: Medicare Other

## 2016-06-07 ENCOUNTER — Ambulatory Visit (INDEPENDENT_AMBULATORY_CARE_PROVIDER_SITE_OTHER): Payer: Medicare Other | Admitting: Vascular Surgery

## 2016-06-08 ENCOUNTER — Ambulatory Visit (INDEPENDENT_AMBULATORY_CARE_PROVIDER_SITE_OTHER): Payer: Medicare Other | Admitting: *Deleted

## 2016-06-08 DIAGNOSIS — I4892 Unspecified atrial flutter: Secondary | ICD-10-CM

## 2016-06-08 DIAGNOSIS — D2261 Melanocytic nevi of right upper limb, including shoulder: Secondary | ICD-10-CM | POA: Diagnosis not present

## 2016-06-08 DIAGNOSIS — D225 Melanocytic nevi of trunk: Secondary | ICD-10-CM | POA: Diagnosis not present

## 2016-06-08 DIAGNOSIS — D2272 Melanocytic nevi of left lower limb, including hip: Secondary | ICD-10-CM | POA: Diagnosis not present

## 2016-06-08 DIAGNOSIS — D045 Carcinoma in situ of skin of trunk: Secondary | ICD-10-CM | POA: Diagnosis not present

## 2016-06-08 DIAGNOSIS — Z5181 Encounter for therapeutic drug level monitoring: Secondary | ICD-10-CM

## 2016-06-08 DIAGNOSIS — Z85828 Personal history of other malignant neoplasm of skin: Secondary | ICD-10-CM | POA: Diagnosis not present

## 2016-06-08 DIAGNOSIS — D485 Neoplasm of uncertain behavior of skin: Secondary | ICD-10-CM | POA: Diagnosis not present

## 2016-06-08 LAB — POCT INR: INR: 2.6

## 2016-06-10 NOTE — Telephone Encounter (Signed)
Opened in error

## 2016-06-10 NOTE — Telephone Encounter (Deleted)
Error

## 2016-06-24 ENCOUNTER — Encounter (INDEPENDENT_AMBULATORY_CARE_PROVIDER_SITE_OTHER): Payer: Self-pay | Admitting: Vascular Surgery

## 2016-06-24 ENCOUNTER — Ambulatory Visit (INDEPENDENT_AMBULATORY_CARE_PROVIDER_SITE_OTHER): Payer: Medicare Other | Admitting: Vascular Surgery

## 2016-06-24 VITALS — BP 128/68 | HR 64 | Resp 16 | Ht 71.0 in | Wt 248.0 lb

## 2016-06-24 DIAGNOSIS — I83811 Varicose veins of right lower extremities with pain: Secondary | ICD-10-CM | POA: Diagnosis not present

## 2016-06-24 DIAGNOSIS — I872 Venous insufficiency (chronic) (peripheral): Secondary | ICD-10-CM

## 2016-06-24 NOTE — Progress Notes (Signed)
Chronic venous insufficiency See laser note   The patient's right lower extremity was sterilely prepped and draped. The ultrasound machine was used to visualize the saphenous vein throughout its course. A segment of the GSV in the mid to upper calf was selected for access. The saphenous vein was accessed without difficulty using ultrasound guidance with a micro puncture needle. A micro puncture wire and sheath were then placed. A 0.018 wire was placed beyond the saphenofemoral junction through the sheath and the micro puncture sheath was removed. The 65 cm sheath was then placed over the wire and the wire and dilator were removed. The laser fiber was placed through the sheath and its tip was placed approximately 4 cm below the saphenofemoral junction. Tumescent anesthesia was then created with a dilute lidocaine solution. Laser energy was then delivered with constant withdrawal of the sheath and laser fiber. Approximately 1795 Joules of energy were delivered over a length of 43 cm using the 1470 Hz VenaCure machine at [employer]. Sterile dressings were placed. The patient tolerated the procedure well without complications. The SSV ablation is described below.  .Chronic venous insufficiency See laser note   The patient's right lower extremity was sterilely prepped and draped. The ultrasound machine was used to visualize the lesser saphenous vein throughout its course. A segment in the mid calf was selected for access. The lesser saphenous vein was accessed without difficulty using ultrasound guidance with a micro puncture needle. A 0.018 wire was placed beyond the small saphenous-popliteal junction. The 65cm sheath was placed over the wire and the wire and dilator were removed. The laser fiber was placed through the sheath and its tip was placed approximately 3-4 cm below the small saphenous-popliteal junction. Tumescent anesthesia was then created with a dilute lidocaine solution. Laser energy was then delivered  with constant withdrawal of the sheath and laser fiber. Approximately 774 Joules of energy were delivered over a length of 17 cm using the 1470 Hz Venocare machine at [employer]. Sterile dressings were placed. The patient tolerated the procedure well without complications.

## 2016-06-24 NOTE — Assessment & Plan Note (Signed)
See laser note 

## 2016-06-28 ENCOUNTER — Ambulatory Visit (INDEPENDENT_AMBULATORY_CARE_PROVIDER_SITE_OTHER): Payer: Medicare Other

## 2016-06-28 ENCOUNTER — Ambulatory Visit (INDEPENDENT_AMBULATORY_CARE_PROVIDER_SITE_OTHER): Payer: Medicare Other | Admitting: Internal Medicine

## 2016-06-28 ENCOUNTER — Encounter: Payer: Self-pay | Admitting: Internal Medicine

## 2016-06-28 ENCOUNTER — Ambulatory Visit: Payer: Medicare Other | Admitting: Internal Medicine

## 2016-06-28 VITALS — BP 108/68 | HR 49 | Temp 97.7°F | Ht 71.5 in | Wt 239.0 lb

## 2016-06-28 DIAGNOSIS — Z23 Encounter for immunization: Secondary | ICD-10-CM

## 2016-06-28 DIAGNOSIS — E1140 Type 2 diabetes mellitus with diabetic neuropathy, unspecified: Secondary | ICD-10-CM

## 2016-06-28 DIAGNOSIS — I872 Venous insufficiency (chronic) (peripheral): Secondary | ICD-10-CM

## 2016-06-28 DIAGNOSIS — I4892 Unspecified atrial flutter: Secondary | ICD-10-CM | POA: Diagnosis not present

## 2016-06-28 DIAGNOSIS — Z0000 Encounter for general adult medical examination without abnormal findings: Secondary | ICD-10-CM

## 2016-06-28 DIAGNOSIS — E785 Hyperlipidemia, unspecified: Secondary | ICD-10-CM

## 2016-06-28 DIAGNOSIS — I10 Essential (primary) hypertension: Secondary | ICD-10-CM

## 2016-06-28 DIAGNOSIS — D696 Thrombocytopenia, unspecified: Secondary | ICD-10-CM

## 2016-06-28 DIAGNOSIS — Z7189 Other specified counseling: Secondary | ICD-10-CM | POA: Diagnosis not present

## 2016-06-28 LAB — RENAL FUNCTION PANEL
Albumin: 3.8 g/dL (ref 3.5–5.2)
BUN: 20 mg/dL (ref 6–23)
CO2: 30 mEq/L (ref 19–32)
Calcium: 10.7 mg/dL — ABNORMAL HIGH (ref 8.4–10.5)
Chloride: 103 mEq/L (ref 96–112)
Creatinine, Ser: 0.83 mg/dL (ref 0.40–1.50)
GFR: 93.9 mL/min (ref >60.00)
Glucose, Bld: 177 mg/dL — ABNORMAL HIGH (ref 70–99)
Phosphorus: 2.5 mg/dL (ref 2.3–4.6)
Potassium: 3.8 mEq/L (ref 3.5–5.1)
Sodium: 139 mEq/L (ref 135–145)

## 2016-06-28 LAB — CBC WITH DIFFERENTIAL/PLATELET
Basophils Absolute: 0 10*3/uL (ref 0.0–0.1)
Basophils Relative: 0.7 % (ref 0.0–3.0)
Eosinophils Absolute: 0.4 10*3/uL (ref 0.0–0.7)
Eosinophils Relative: 6 % — ABNORMAL HIGH (ref 0.0–5.0)
HCT: 44.9 % (ref 39.0–52.0)
Hemoglobin: 15.4 g/dL (ref 13.0–17.0)
Lymphocytes Relative: 14.8 % (ref 12.0–46.0)
Lymphs Abs: 1.1 10*3/uL (ref 0.7–4.0)
MCHC: 34.3 g/dL (ref 30.0–36.0)
MCV: 91.1 fl (ref 78.0–100.0)
Monocytes Absolute: 1.1 10*3/uL — ABNORMAL HIGH (ref 0.1–1.0)
Monocytes Relative: 14.5 % — ABNORMAL HIGH (ref 3.0–12.0)
Neutro Abs: 4.7 10*3/uL (ref 1.4–7.7)
Neutrophils Relative %: 64 % (ref 43.0–77.0)
Platelets: 131 10*3/uL — ABNORMAL LOW (ref 150.0–400.0)
RBC: 4.93 Mil/uL (ref 4.22–5.81)
RDW: 13.5 % (ref 11.5–15.5)
WBC: 7.3 10*3/uL (ref 4.0–10.5)

## 2016-06-28 LAB — HEMOGLOBIN A1C: Hgb A1c MFr Bld: 7.8 % — ABNORMAL HIGH (ref 4.6–6.5)

## 2016-06-28 NOTE — Assessment & Plan Note (Signed)
Very low Discussed that he can probably stop the zetia--but should check with Dr Rockey Situ

## 2016-06-28 NOTE — Assessment & Plan Note (Signed)
Seems to have better control Will check A1c

## 2016-06-28 NOTE — Assessment & Plan Note (Signed)
Follows with cardiology Maintained on coumadin

## 2016-06-28 NOTE — Assessment & Plan Note (Signed)
Has DNR 

## 2016-06-28 NOTE — Progress Notes (Signed)
Subjective:    Patient ID: Michael Colon, male    DOB: 01-13-1930, 83 y.o.   MRN: [id number]  HPI Here for Medicare wellness visit and follow up of chronic health conditions Reviewed form and advanced directives Reviewed other doctors--- list on form Had precancerous lesion removed recently. Vascular procedure by Dr Lucky Cowboy (laser venous ablation). EGD by Dr Allen Norris for dysphagia No alcohol or tobacco Vision is okay Mild hearing problems Tries to exercise regularly 1 fall this year--with bruising No depression or anhedonia Independent with instrumental ADLs No sig memory issues  Most concerning is his urinary dribbling Has good flow--but then dribbles for a while Nocturia x 1 only (and not every night) Seems to empty okay Has to use pad Urine smells bad--wondered about the 2 new meds causing this (zetia and glipizide)  Checks sugars regularly--every 3-4 days Has been as low as 88---and not generally over 120 No hypoglycemic reactions No foot pain--no lesions  No chest pain No palpitations No dizziness or syncope Edema controlled with hose Did well with the laser venous ablation No venous ulcers now  No abnormal bleeding Bruises very easy  Current Outpatient Prescriptions on File Prior to Visit  Medication Sig Dispense Refill  . atorvastatin (LIPITOR) 80 MG tablet Take 1 tablet (80 mg total) by mouth daily. 90 tablet 3  . docusate sodium (COLACE) 100 MG capsule Take 100 mg by mouth 2 (two) times daily.     Marland Kitchen ezetimibe (ZETIA) 10 MG tablet Take 1 tablet (10 mg total) by mouth daily. 90 tablet 3  . famotidine (PEPCID) 20 MG tablet Take 20 mg by mouth daily.     . finasteride (PROSCAR) 5 MG tablet TAKE ONE TABLET BY MOUTH ONCE DAILY 90 tablet 1  . glipiZIDE (GLUCOTROL XL) 5 MG 24 hr tablet Take 1 tablet (5 mg total) by mouth daily with breakfast. 90 tablet 1  . Glucosamine Sulfate-MSM (GLUCOSAMINE-MSM DS) 500-500 MG TABS Take 1,500 mg by mouth daily.     Marland Kitchen glucose blood  (BAYER CONTOUR TEST) test strip USE STRIP TO TEST BLOOD SUGAR TWICE DAILY AS INSTRUCTED. Dx Code: E11.40 (Patient taking differently: 1 each daily. USE STRIP TO TEST BLOOD SUGAR ONCE DAILY AS INSTRUCTED. Dx Code: E11.40) 100 each 11  . ibuprofen (ADVIL,MOTRIN) 200 MG tablet Take 400 mg by mouth 2 (two) times daily.     Marland Kitchen loratadine (CLARITIN) 10 MG tablet Take 10 mg by mouth daily.      . metFORMIN (GLUCOPHAGE) 500 MG tablet TAKE ONE TABLET BY MOUTH TWICE DAILY WITH  A  MEAL 180 tablet 3  . MICROLET LANCETS MISC Use to test twice daily or as directed 100 each 3  . Multiple Vitamins-Minerals (CENTRUM SILVER PO) Take 1 tablet by mouth daily after breakfast.     . tamsulosin (FLOMAX) 0.4 MG CAPS capsule TAKE ONE CAPSULE BY MOUTH ONCE DAILY 90 capsule 3  . triamterene-hydrochlorothiazide (DYAZIDE) 37.5-25 MG capsule TAKE ONE CAPSULE BY MOUTH IN THE MORNING 90 capsule 3  . warfarin (COUMADIN) 5 MG tablet TAKE ONE TO ONE & ONE-HALF TABLETS BY MOUTH ONCE DAILY AS DIRECTED 135 tablet 1   No current facility-administered medications on file prior to visit.     Allergies  Allergen Reactions  . Adhesive [Tape] Rash  . Enoxaparin Sodium Hives  . Latex Rash    Has trouble with BANDAIDS that have been left on for more than 24 hours. Prefers paper tape.  . Nickel Rash  . Percocet [Oxycodone-Acetaminophen]  Rash    Past Medical History:  Diagnosis Date  . Asthma   . Atrial flutter ([place]) 2007  . Band keratopathy   . Benign prostatic hypertrophy   . Chronic ear infection   . Chronic kidney disease   . Chronic prostatitis   . Dental bridge present    permanent - upper  . Diabetes mellitus   . Elbow stiffness, left    s/p fracture many yrs ago.  arm does not straighten  . GERD (gastroesophageal reflux disease)    laryngeal involvement  . Hyperlipidemia   . Hypertension   . Obstructive sleep apnea    CPAP-9  . Osteoarthrosis, localized, primary, knee    post-traumatic  . Prostatitis   .  Stroke [facility])    TIA  . Tachy-brady syndrome ([place])   . UTI (urinary tract infection)     Past Surgical History:  Procedure Laterality Date  . APPENDECTOMY    . BELPHAROPTOSIS REPAIR     Dr [name]---didn't resolve weepy eye and eyelid drooping  . CARDIOVERSION  04/13/2010  . CATARACT EXTRACTION, BILATERAL  2009  . Chest pain  8/12   Stress test benign  . ESOPHAGEAL DILATION  05/26/2016   Procedure: ESOPHAGEAL DILATION;  Surgeon: Lucilla Lame, MD;  Location: [facility];  Service: Endoscopy;;  . ESOPHAGOGASTRODUODENOSCOPY (EGD) WITH PROPOFOL N/A 05/26/2016   Procedure: ESOPHAGOGASTRODUODENOSCOPY (EGD) WITH PROPOFOL;  Surgeon: Lucilla Lame, MD;  Location: [facility];  Service: Endoscopy;  Laterality: N/A;  Diabetic - oral meds sleep apnea  . EYE SURGERY    . FRACTURE SURGERY Left    elbow  . KNEE SURGERY  1998   plate after fracture, then removed for infection  . left elbow surgery    . MASTOIDECTOMY  8/08   Dr Idelle Crouch  . RHINOPLASTY  5/10   and septoplasty  . SUBACROMIAL DECOMPRESSION Right 2005   Arthroscopic (for rotator cuff and biceps tendon ruptures)  . TEAR DUCT PROBING  11/13   Dr Vickki Muff  . TOTAL KNEE ARTHROPLASTY Left 09/01/2014   Procedure: TOTAL KNEE ARTHROPLASTY;  Surgeon: Dereck Leep, MD;  Location: [facility] ORS;  Service: Orthopedics;  Laterality: Left;  . TRIGGER FINGER RELEASE Left 02/25/2015   Procedure: LEFT LONG TRIGGER RELEASE;  Surgeon: Dereck Leep, MD;  Location: [facility] ORS;  Service: Orthopedics;  Laterality: Left;  Marland Kitchen VENOUS ABLATION      Family History  Problem Relation Age of Onset  . Colon cancer Father   . Diabetes Father   . Hypertension Father   . Other Mother        natural causes  . Heart attack Neg Hx   . Stroke Neg Hx     Social History   Social History  . Marital status: Married    Spouse name: N/A  . Number of children: 2  . 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 Smoking status: Former Smoker    Packs/day: 1.00    Years: 0.00    Types: Cigarettes    Quit date: 01/03/1969  . Smokeless tobacco: Never Used  . Alcohol use No     Comment: rare wine. 1-2x/yr.  . Drug use: No  . Sexual activity: Not on file   Other Topics Concern  . Not on file   Social History Narrative   Has living will   Health care POA-- wife and then son  Shanon Brow   Has DNR order from the past---form redone 06/25/10   Probably no feeding tube if cognitively unaware   Review of Systems Sleeps okay at night. Uses the CPAP nightly Gets tired after meals--will often nap then Appetite is good Weight stable Still has mild dysphagia--even after EGD. Trouble when dry with first swallows  Still some hoarseness and choking with liquids (still on famotidine) Bowels are fine--no blood Has SCC in pubic area--removed with clear margins in 2011 and now some recurrence--has surgery scheduled for this Folliculitis in scalp Expects need for right TKR ---- may need soon     Objective:   Physical Exam  Constitutional: He is oriented to person, place, and time. He appears well-nourished. No distress.  HENT:  Mouth/Throat: Oropharynx is clear and moist. No oropharyngeal exudate.  Neck: No thyromegaly present.  Cardiovascular: Normal rate, regular rhythm, normal heart sounds and intact distal pulses.  Exam reveals no gallop.   No murmur heard. Feet cool but with palpable pulses  Pulmonary/Chest: Effort normal and breath sounds normal. No respiratory distress. He has no wheezes. He has no rales.  Abdominal: Soft. There is no tenderness.  Musculoskeletal: He exhibits no edema or tenderness.  Neurological: He is alert and oriented to person, place, and time.  President--- "Dwaine Deter, Bush" [phone] Recall 3/3  Skin:  No foot lesions  Psychiatric: He has a normal mood and affect. His behavior is normal.          Assessment &  Plan:

## 2016-06-28 NOTE — Assessment & Plan Note (Signed)
I have personally reviewed the Medicare Annual Wellness questionnaire and have noted 1. The patient's medical and social history 2. Their use of alcohol, tobacco or illicit drugs 3. Their current medications and supplements 4. The patient's functional ability including ADL's, fall risks, home safety risks and hearing or visual             impairment. 5. Diet and physical activities 6. Evidence for depression or mood disorders  The patients weight, height, BMI and visual acuity have been recorded in the chart I have made referrals, counseling and provided education to the patient based review of the above and I have provided the pt with a written personalized care plan for preventive services.  I have provided you with a copy of your personalized plan for preventive services. Please take the time to review along with your updated medication list.  Will update pneumovax Yearly flu vaccine Discussed exercise No cancer screening due to age

## 2016-06-28 NOTE — Addendum Note (Signed)
Addended by: Pilar Grammes on: 06/28/2016 09:57 AM   Modules accepted: Orders

## 2016-06-28 NOTE — Assessment & Plan Note (Signed)
Easy bruising due to coumadin? Elevated WBC also--if still up, will proceed with hematology evaluation

## 2016-06-28 NOTE — Assessment & Plan Note (Signed)
BP Readings from Last 3 Encounters:  06/28/16 108/68  06/24/16 128/68  05/26/16 133/81   Good control

## 2016-06-29 ENCOUNTER — Ambulatory Visit (INDEPENDENT_AMBULATORY_CARE_PROVIDER_SITE_OTHER): Payer: Medicare Other

## 2016-06-29 DIAGNOSIS — Z5181 Encounter for therapeutic drug level monitoring: Secondary | ICD-10-CM

## 2016-06-29 DIAGNOSIS — I4892 Unspecified atrial flutter: Secondary | ICD-10-CM

## 2016-06-29 LAB — POCT INR: INR: 2.2

## 2016-07-11 DIAGNOSIS — L905 Scar conditions and fibrosis of skin: Secondary | ICD-10-CM | POA: Diagnosis not present

## 2016-07-11 DIAGNOSIS — D045 Carcinoma in situ of skin of trunk: Secondary | ICD-10-CM | POA: Diagnosis not present

## 2016-07-19 ENCOUNTER — Encounter (INDEPENDENT_AMBULATORY_CARE_PROVIDER_SITE_OTHER): Payer: Self-pay | Admitting: Vascular Surgery

## 2016-07-19 ENCOUNTER — Ambulatory Visit (INDEPENDENT_AMBULATORY_CARE_PROVIDER_SITE_OTHER): Payer: Medicare Other | Admitting: Vascular Surgery

## 2016-07-19 VITALS — BP 120/71 | HR 46 | Resp 17 | Ht 70.5 in | Wt 242.0 lb

## 2016-07-19 DIAGNOSIS — E1140 Type 2 diabetes mellitus with diabetic neuropathy, unspecified: Secondary | ICD-10-CM | POA: Diagnosis not present

## 2016-07-19 DIAGNOSIS — I10 Essential (primary) hypertension: Secondary | ICD-10-CM | POA: Diagnosis not present

## 2016-07-19 DIAGNOSIS — I4892 Unspecified atrial flutter: Secondary | ICD-10-CM | POA: Diagnosis not present

## 2016-07-19 DIAGNOSIS — E785 Hyperlipidemia, unspecified: Secondary | ICD-10-CM | POA: Diagnosis not present

## 2016-07-19 DIAGNOSIS — I872 Venous insufficiency (chronic) (peripheral): Secondary | ICD-10-CM

## 2016-07-19 NOTE — Assessment & Plan Note (Signed)
The patient has undergone great saphenous vein and small saphenous vein ablation in the right leg. He has had no recurrent ulcerations. His swelling is better. Continue her compression stockings and elevate his legs. He does not have a lot of residual varicosities that would appear to require any intervention at this point. Plan to see back in 3-4 months or sooner if problems develop in the interim.

## 2016-07-19 NOTE — Progress Notes (Signed)
MRN : [id number]  Michael Colon is a 83 y.o. (Nov 08, 1930) male who presents with chief complaint of  Chief Complaint  Patient presents with  . Venous Insufficiency    3-4 week post laser  .  History of Present Illness: Patient returns today in follow up of venous disease.  He has undergone right great saphenous vein and small saphenous vein laser ablation. His postprocedural duplex showed successful ablation without DVT. He still has some soreness particularly overlying the great saphenous vein ablation. He has not had recurrent ulceration. His swelling is a little better today.  Current Outpatient Prescriptions  Medication Sig Dispense Refill  . atorvastatin (LIPITOR) 80 MG tablet Take 1 tablet (80 mg total) by mouth daily. 90 tablet 3  . docusate sodium (COLACE) 100 MG capsule Take 100 mg by mouth 2 (two) times daily.     Marland Kitchen ezetimibe (ZETIA) 10 MG tablet     . famotidine (PEPCID) 20 MG tablet Take 20 mg by mouth daily.     . finasteride (PROSCAR) 5 MG tablet TAKE ONE TABLET BY MOUTH ONCE DAILY 90 tablet 1  . glipiZIDE (GLUCOTROL XL) 5 MG 24 hr tablet Take 1 tablet (5 mg total) by mouth daily with breakfast. 90 tablet 1  . Glucosamine Sulfate-MSM (GLUCOSAMINE-MSM DS) 500-500 MG TABS Take 1,500 mg by mouth daily.     Marland Kitchen glucose blood (BAYER CONTOUR TEST) test strip USE STRIP TO TEST BLOOD SUGAR TWICE DAILY AS INSTRUCTED. Dx Code: E11.40 (Patient taking differently: 1 each daily. USE STRIP TO TEST BLOOD SUGAR ONCE DAILY AS INSTRUCTED. Dx Code: E11.40) 100 each 11  . ibuprofen (ADVIL,MOTRIN) 200 MG tablet Take 400 mg by mouth 2 (two) times daily.     Marland Kitchen loratadine (CLARITIN) 10 MG tablet Take 10 mg by mouth daily.      . metFORMIN (GLUCOPHAGE) 500 MG tablet TAKE ONE TABLET BY MOUTH TWICE DAILY WITH  A  MEAL 180 tablet 3  . MICROLET LANCETS MISC Use to test twice daily or as directed 100 each 3  . tamsulosin (FLOMAX) 0.4 MG CAPS capsule TAKE ONE CAPSULE BY MOUTH ONCE DAILY 90 capsule 3  .  triamterene-hydrochlorothiazide (DYAZIDE) 37.5-25 MG capsule TAKE ONE CAPSULE BY MOUTH IN THE MORNING 90 capsule 3  . warfarin (COUMADIN) 5 MG tablet TAKE ONE TO ONE & ONE-HALF TABLETS BY MOUTH ONCE DAILY AS DIRECTED 135 tablet 1   No current facility-administered medications for this visit.     Past Medical History:  Diagnosis Date  . Asthma   . Atrial flutter ([place]) 2007  . Band keratopathy   . Benign prostatic hypertrophy   . Chronic ear infection   . Chronic kidney disease   . Chronic prostatitis   . Dental bridge present    permanent - upper  . Diabetes mellitus   . Elbow stiffness, left    s/p fracture many yrs ago.  arm does not straighten  . GERD (gastroesophageal reflux disease)    laryngeal involvement  . Hyperlipidemia   . Hypertension   . Obstructive sleep apnea    CPAP-9  . Osteoarthrosis, localized, primary, knee    post-traumatic  . Prostatitis   . Stroke [facility])    TIA  . Tachy-brady syndrome ([place])   . UTI (urinary tract infection)     Past Surgical History:  Procedure Laterality Date  . APPENDECTOMY    . BELPHAROPTOSIS REPAIR     Dr [name]---didn't resolve weepy eye and eyelid drooping  .  CARDIOVERSION  04/13/2010  . CATARACT EXTRACTION, BILATERAL  2009  . Chest pain  8/12   Stress test benign  . ESOPHAGEAL DILATION  05/26/2016   Procedure: ESOPHAGEAL DILATION;  Surgeon: Lucilla Lame, MD;  Location: [address];  Service: Endoscopy;;  . ESOPHAGOGASTRODUODENOSCOPY (EGD) WITH PROPOFOL N/A 05/26/2016   Procedure: ESOPHAGOGASTRODUODENOSCOPY (EGD) WITH PROPOFOL;  Surgeon: Lucilla Lame, MD;  Location: [place];  Service: Endoscopy;  Laterality: N/A;  Diabetic - oral meds sleep apnea  . EYE SURGERY    . FRACTURE SURGERY Left    elbow  . KNEE SURGERY  1998   plate after fracture, then removed for infection  . left elbow surgery    . MASTOIDECTOMY  8/08   Dr Idelle Crouch  . RHINOPLASTY  5/10   and septoplasty  . SUBACROMIAL DECOMPRESSION  Right 2005   Arthroscopic (for rotator cuff and biceps tendon ruptures)  . TEAR DUCT PROBING  11/13   Dr Vickki Muff  . TOTAL KNEE ARTHROPLASTY Left 09/01/2014   Procedure: TOTAL KNEE ARTHROPLASTY;  Surgeon: Dereck Leep, MD;  Location: [facility];  Service: Orthopedics;  Laterality: Left;  . TRIGGER FINGER RELEASE Left 02/25/2015   Procedure: LEFT LONG TRIGGER RELEASE;  Surgeon: Dereck Leep, MD;  Location: [facility];  Service: Orthopedics;  Laterality: Left;  Marland Kitchen VENOUS ABLATION      Family History  Problem Relation Age of Onset  . Colon cancer Father   . Diabetes Father   . Hypertension Father   . Other Mother     natural causes  . Heart attack Neg Hx   . Stroke Neg Hx     Social History       Social History  Substance Use Topics  . Smoking status: Former Smoker    Packs/day: 1.00    Years: 0.00    Types: Cigarettes    Quit date: 01/03/1969  . Smokeless tobacco: Never Used  . Alcohol use No     Comment: rare wine  Married      Allergies  Allergen Reactions  . Enoxaparin Sodium Hives  . Latex Rash  . Nickel Rash  . Percocet [Oxycodone-Acetaminophen] Rash        REVIEW OF SYSTEMS(Negative unless checked)  Constitutional: [] Weight loss[] Fever[] Chills Cardiac:[] Chest pain[] Chest pressure[] Palpitations [] Shortness of breath when laying flat [] Shortness of breath at rest [] Shortness of breath with exertion. Vascular: [] Pain in legs with walking[] Pain in legsat rest[] Pain in legs when laying flat [] Claudication [] Pain in feet when walking [] Pain in feet at rest [] Pain in feet when laying flat [] History of DVT [] Phlebitis [x] Swelling in legs [x] Varicose veins [x] Non-healing ulcers Pulmonary: [] Uses home oxygen [] Productive cough[] Hemoptysis [] Wheeze [] COPD [] Asthma Neurologic: [] Dizziness [] Blackouts [] Seizures [] History of stroke [] History of TIA[] Aphasia [] Temporary  blindness[] Dysphagia [] Weaknessor numbness in arms [] Weakness or numbnessin legs Musculoskeletal: [x] Arthritis [] Joint swelling [] Joint pain [] Low back pain Hematologic:[] Easy bruising[] Easy bleeding [] Hypercoagulable state [] Anemic [] Hepatitis Gastrointestinal:[] Blood in stool[] Vomiting blood[] Gastroesophageal reflux/heartburn[] Difficulty swallowing  Genitourinary: [] Chronic kidney disease [] Difficulturination [] Frequenturination [] Burning with urination[] Blood in urine Skin: [] Rashes [x] Ulcers [x] Wounds Psychological: [] History of anxiety[] History of major depression.   Physical Examination  BP 120/71 (BP Location: Right Arm)   Pulse (!) 46   Resp 17   Ht 5' 10.5" (1.791 m)   Wt 242 lb (109.8 kg)   BMI 34.23 kg/m  Gen:  WD/WN, NAD. Appears younger than stated age. Head: [place]/AT, No temporalis wasting. Ear/Nose/Throat: Hearing grossly intact, nares w/o erythema or drainage, trachea midline Eyes: Conjunctiva clear. Sclera non-icteric Neck: Supple.  No JVD.  Pulmonary:  Good air movement, no use of accessory muscles.  Cardiac: RRR, normal S1, S2 Vascular:  Vessel Right Left  Radial Palpable Palpable                                   Gastrointestinal: soft, non-tender/non-distended. No guarding/reflex.  Musculoskeletal: M/S 5/5 throughout.  No deformity or atrophy. 1+ RLE edema, 2+ LLE edema. Neurologic: Sensation grossly intact in extremities.  Symmetrical.  Speech is fluent.  Psychiatric: Judgment intact, Mood & affect appropriate for pt's clinical situation. Dermatologic: No rashes or ulcers noted.  No cellulitis or open wounds. Lymph : No Cervical, Axillary, or Inguinal lymphadenopathy.      Labs Recent Results (from the past 2160 hour(s))  POCT INR     Status: None   Collection Time: 04/27/16 10:42 AM  Result Value Ref Range   INR 2.9   POCT INR     Status: None   Collection Time: 05/25/16 10:47 AM    Result Value Ref Range   INR 1.3   Glucose, capillary     Status: Abnormal   Collection Time: 05/26/16  7:05 AM  Result Value Ref Range   Glucose-Capillary 108 (H) 65 - 99 mg/dL  Glucose, capillary     Status: Abnormal   Collection Time: 05/26/16  8:16 AM  Result Value Ref Range   Glucose-Capillary 128 (H) 65 - 99 mg/dL  POCT INR     Status: None   Collection Time: 06/08/16 10:57 AM  Result Value Ref Range   INR 2.6   Hemoglobin A1c     Status: Abnormal   Collection Time: 06/28/16 10:00 AM  Result Value Ref Range   Hgb A1c MFr Bld 7.8 (H) 4.6 - 6.5 %    Comment: Glycemic Control Guidelines for People with Diabetes:Non Diabetic:  <6%Goal of Therapy: <7%Additional Action Suggested:  >8%   Renal function panel     Status: Abnormal   Collection Time: 06/28/16 10:00 AM  Result Value Ref Range   Sodium 139 135 - 145 mEq/L   Potassium 3.8 3.5 - 5.1 mEq/L   Chloride 103 96 - 112 mEq/L   CO2 30 19 - 32 mEq/L   Calcium 10.7 (H) 8.4 - 10.5 mg/dL   Albumin 3.8 3.5 - 5.2 g/dL   BUN 20 6 - 23 mg/dL   Creatinine, Ser 0.83 0.40 - 1.50 mg/dL   Glucose, Bld 177 (H) 70 - 99 mg/dL   Phosphorus 2.5 2.3 - 4.6 mg/dL   GFR 93.90 >60.00 mL/min  CBC with Differential/Platelet     Status: Abnormal   Collection Time: 06/28/16 10:00 AM  Result Value Ref Range   WBC 7.3 4.0 - 10.5 K/uL   RBC 4.93 4.22 - 5.81 Mil/uL   Hemoglobin 15.4 13.0 - 17.0 g/dL   HCT 44.9 39.0 - 52.0 %   MCV 91.1 78.0 - 100.0 fl   MCHC 34.3 30.0 - 36.0 g/dL   RDW 13.5 11.5 - 15.5 %   Platelets 131.0 (L) 150.0 - 400.0 K/uL   Neutrophils Relative % 64.0 43.0 - 77.0 %   Lymphocytes Relative 14.8 12.0 - 46.0 %   Monocytes Relative 14.5 (H) 3.0 - 12.0 %   Eosinophils Relative 6.0 (H) 0.0 - 5.0 %   Basophils Relative 0.7 0.0 - 3.0 %   Neutro Abs 4.7 1.4 - 7.7 K/uL   Lymphs Abs 1.1 0.7 - 4.0 K/uL  Monocytes Absolute 1.1 (H) 0.1 - 1.0 K/uL   Eosinophils Absolute 0.4 0.0 - 0.7 K/uL   Basophils Absolute 0.0 0.0 - 0.1 K/uL  POCT  INR     Status: None   Collection Time: 06/29/16  9:34 AM  Result Value Ref Range   INR 2.2     Radiology No results found.   Assessment/Plan Atrial flutter On anticoagulation  Hypertension blood pressure control important in reducing the progression of atherosclerotic disease. On appropriate oral medications.   Type 2 diabetes, controlled, with neuropathy (HCC) blood glucose control important in reducing the progression of atherosclerotic disease. Also, involved in wound healing. On appropriate medications.   Hyperlipidemia lipid control important in reducing the progression of atherosclerotic disease. Continue statin therapy  No problem-specific Assessment & Plan notes found for this encounter.    Leotis Pain, MD  07/19/2016 4:13 PM    This note was created with Dragon medical transcription system.  Any errors from dictation are purely unintentional

## 2016-07-27 ENCOUNTER — Ambulatory Visit (INDEPENDENT_AMBULATORY_CARE_PROVIDER_SITE_OTHER): Payer: Medicare Other

## 2016-07-27 DIAGNOSIS — Z5181 Encounter for therapeutic drug level monitoring: Secondary | ICD-10-CM

## 2016-07-27 DIAGNOSIS — I4892 Unspecified atrial flutter: Secondary | ICD-10-CM

## 2016-07-27 LAB — POCT INR: INR: 3.3

## 2016-07-29 ENCOUNTER — Other Ambulatory Visit: Payer: Self-pay | Admitting: Cardiovascular Disease

## 2016-07-29 NOTE — Telephone Encounter (Signed)
Please review for refill, Thanks !  

## 2016-08-16 DIAGNOSIS — B351 Tinea unguium: Secondary | ICD-10-CM | POA: Diagnosis not present

## 2016-08-16 DIAGNOSIS — E1142 Type 2 diabetes mellitus with diabetic polyneuropathy: Secondary | ICD-10-CM | POA: Diagnosis not present

## 2016-08-16 DIAGNOSIS — L851 Acquired keratosis [keratoderma] palmaris et plantaris: Secondary | ICD-10-CM | POA: Diagnosis not present

## 2016-08-17 ENCOUNTER — Ambulatory Visit (INDEPENDENT_AMBULATORY_CARE_PROVIDER_SITE_OTHER): Payer: Medicare Other

## 2016-08-17 DIAGNOSIS — Z5181 Encounter for therapeutic drug level monitoring: Secondary | ICD-10-CM | POA: Diagnosis not present

## 2016-08-17 DIAGNOSIS — I4892 Unspecified atrial flutter: Secondary | ICD-10-CM

## 2016-08-17 LAB — POCT INR: INR: 4.6

## 2016-08-31 ENCOUNTER — Ambulatory Visit (INDEPENDENT_AMBULATORY_CARE_PROVIDER_SITE_OTHER): Payer: Medicare Other

## 2016-08-31 DIAGNOSIS — Z5181 Encounter for therapeutic drug level monitoring: Secondary | ICD-10-CM

## 2016-08-31 DIAGNOSIS — I4892 Unspecified atrial flutter: Secondary | ICD-10-CM | POA: Diagnosis not present

## 2016-08-31 LAB — POCT INR: INR: 2.4

## 2016-09-06 DIAGNOSIS — Z23 Encounter for immunization: Secondary | ICD-10-CM | POA: Diagnosis not present

## 2016-09-13 ENCOUNTER — Other Ambulatory Visit: Payer: Self-pay | Admitting: Cardiovascular Disease

## 2016-09-13 ENCOUNTER — Other Ambulatory Visit: Payer: Self-pay | Admitting: Internal Medicine

## 2016-09-19 ENCOUNTER — Ambulatory Visit (INDEPENDENT_AMBULATORY_CARE_PROVIDER_SITE_OTHER): Payer: Medicare Other

## 2016-09-19 DIAGNOSIS — I4892 Unspecified atrial flutter: Secondary | ICD-10-CM | POA: Diagnosis not present

## 2016-09-19 DIAGNOSIS — Z5181 Encounter for therapeutic drug level monitoring: Secondary | ICD-10-CM | POA: Diagnosis not present

## 2016-09-19 LAB — POCT INR: INR: 2.2

## 2016-09-21 NOTE — Progress Notes (Signed)
Cardiology Office Note  Date:  09/22/2016   ID:  Michael Colon, DOB [birth year], MRN [id number]  PCP:  Venia Carbon, MD   Chief Complaint  Patient presents with  . other    12 month follow up. Meds reviewed by the pt. verbally. "doing well."     HPI:   Michael Colon is a very pleasant 83 year old gentleman with history of  CT calcium score 1300,  atrial flutter (3/14), previous ablation , on warfarin,  hypertension,  borderline diabetes,  Obstructive sleep apnea  [facility] on August 04, 2010 with chest pain.  Symptoms started after eating and it was felt he had GI spasm or hiatal hernia. Mild improvement in symptoms with nitroglycerin and GI cocktail in the emergency room. He is very active at baseline.  He reports having a TIA some time in 2014, workup negative at [facility]  Previous vein ablation surgery at [facility] Underlying diabetes type 2 He presents today for follow-up of his atrial flutter, hyperlipidemia  Recently evaluated by Dr. Lucky Cowboy Had a procedure in the hospital, right great saphenous vein and small saphenous vein laser ablation. His postprocedural duplex showed successful ablation without DVT.  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  Pacemaker was not recommended at that time. Previously seen by Dr. Caryl Comes, EP Previous Holter monitor showed normal sinus rhythm with pauses up to 2.86 seconds, bradycardia with heart rates in the 20s to 30s at nighttime, 30s to 50 during the daytime.  Hemoglobin A1c in December 2014 7.5, total cholesterol up from 132 now 200   PMH:   has a past medical history of Asthma; Atrial flutter ([place]) (2007); Band keratopathy; Benign prostatic hypertrophy; Chronic ear infection; Chronic kidney disease; Chronic prostatitis; Dental bridge present; Diabetes mellitus; Elbow stiffness, left; GERD (gastroesophageal reflux disease); Hyperlipidemia; Hypertension; Obstructive sleep apnea; Osteoarthrosis, localized, primary, knee; Prostatitis; Stroke ([name]); Tachy-brady syndrome ([place]); and UTI (urinary tract infection).  PSH:    Past Surgical History:  Procedure Laterality Date  . APPENDECTOMY    . BELPHAROPTOSIS REPAIR     Dr [name]---didn't resolve weepy eye and eyelid drooping  . CARDIOVERSION  04/13/2010  . CATARACT EXTRACTION, BILATERAL  2009  . Chest pain  8/12   Stress test benign  . ESOPHAGEAL DILATION  05/26/2016   Procedure: ESOPHAGEAL DILATION;  Surgeon: Lucilla Lame, MD;  Location: [place];  Service: Endoscopy;;  . ESOPHAGOGASTRODUODENOSCOPY (EGD) WITH PROPOFOL N/A 05/26/2016   Procedure: ESOPHAGOGASTRODUODENOSCOPY (EGD) WITH PROPOFOL;  Surgeon: Lucilla Lame, MD;  Location: [place];  Service: Endoscopy;  Laterality: N/A;  Diabetic - oral meds sleep apnea  . EYE SURGERY    . FRACTURE SURGERY Left    elbow  . KNEE SURGERY  1998   plate after fracture, then removed for  infection  . left elbow surgery    . MASTOIDECTOMY  8/08   Dr Idelle Crouch  . RHINOPLASTY  5/10   and septoplasty  . SUBACROMIAL DECOMPRESSION Right 2005   Arthroscopic (for rotator cuff and biceps tendon ruptures)  . TEAR DUCT PROBING  11/13   Dr Vickki Muff  . TOTAL KNEE ARTHROPLASTY Left 09/01/2014   Procedure:  TOTAL KNEE ARTHROPLASTY;  Surgeon: Dereck Leep, MD;  Location: [facility];  Service: Orthopedics;  Laterality: Left;  . TRIGGER FINGER RELEASE Left 02/25/2015   Procedure: LEFT LONG TRIGGER RELEASE;  Surgeon: Dereck Leep, MD;  Location: [facility];  Service: Orthopedics;  Laterality: Left;  Marland Kitchen VENOUS ABLATION      Current Outpatient Prescriptions  Medication Sig Dispense Refill  . atorvastatin (LIPITOR) 80 MG tablet TAKE ONE TABLET BY MOUTH ONCE DAILY 90 tablet 3  . docusate sodium (COLACE) 100 MG capsule Take 100 mg by mouth 2 (two) times daily.     . famotidine (PEPCID) 20 MG tablet Take 20 mg by mouth daily.     . finasteride (PROSCAR) 5 MG tablet TAKE 1 TABLET BY MOUTH ONCE DAILY 90 tablet 1  . GLIPIZIDE XL 5 MG 24 hr tablet TAKE 1 TABLET BY MOUTH ONCE DAILY WITH BREAKFAST 90 tablet 1  . Glucosamine Sulfate-MSM (GLUCOSAMINE-MSM DS) 500-500 MG TABS Take 1,000 mg by mouth daily.     Marland Kitchen glucose blood (BAYER CONTOUR TEST) test strip USE STRIP TO TEST BLOOD SUGAR TWICE DAILY AS INSTRUCTED. Dx Code: E11.40 (Patient taking differently: 1 each by Other route as needed. ) 100 each 11  . ibuprofen (ADVIL,MOTRIN) 200 MG tablet Take 400 mg by mouth 2 (two) times daily.     Marland Kitchen loratadine (CLARITIN) 10 MG tablet Take 10 mg by mouth daily.      . metFORMIN (GLUCOPHAGE) 500 MG tablet TAKE ONE TABLET BY MOUTH TWICE DAILY WITH  A  MEAL 180 tablet 3  . MICROLET LANCETS MISC Use to test twice daily or as directed (Patient taking differently: Use to test twice weekly or as directed) 100 each 3  . tamsulosin (FLOMAX) 0.4 MG CAPS capsule TAKE ONE CAPSULE BY MOUTH ONCE DAILY 90 capsule 3  . triamterene-hydrochlorothiazide (DYAZIDE) 37.5-25 MG capsule TAKE ONE CAPSULE BY MOUTH IN THE MORNING 90 capsule 3  . warfarin (COUMADIN) 5 MG tablet TAKE ONE TO ONE & ONE-HALF TABLETS BY MOUTH ONCE DAILY AS DIRECTED 135 tablet 1   No current facility-administered medications for this visit.      Allergies:   Adhesive  [tape]; Enoxaparin sodium; Latex; Nickel; and Percocet [oxycodone-acetaminophen]   Social History:  The patient  reports that he quit smoking about 47 years ago. His smoking use included Cigarettes. He smoked 1.00 pack per day for 0.00 years. He has never used smokeless tobacco. He reports that he does not drink alcohol or use drugs.   Family History:   family history includes Colon cancer in his father; Diabetes in his father; Hypertension in his father; Other in his mother.    Review of Systems: Review of Systems  Constitutional: Negative.   Respiratory: Positive for shortness of breath.   Cardiovascular: Positive for leg swelling.  Gastrointestinal: Negative.   Musculoskeletal: Negative.   Neurological: Negative.   Psychiatric/Behavioral: Negative.   All other systems reviewed and are negative.    PHYSICAL EXAM: VS:  BP 122/70 (BP Location: Right Arm, Patient Position: Sitting, Cuff Size: Normal)   Pulse (!) 56   Ht 5\' 11"  (  1.803 m)   Wt 240 lb 8 oz (109.1 kg)   BMI 33.54 kg/m  , BMI Body mass index is 33.54 kg/m. GEN: Well nourished, well developed, in no acute distress  HEENT: normal  Neck: no JVD, carotid bruits, or masses Cardiac: RRR; no murmurs, rubs, or gallops,no edema  Respiratory:  clear to auscultation bilaterally, normal work of breathing GI: soft, nontender, nondistended, + BS MS: no deformity or atrophy  Skin: warm and dry, no rash Neuro:  Strength and sensation are intact Psych: euthymic mood, full affect    Recent Labs: 12/11/2015: ALT 63 06/28/2016: BUN 20; Creatinine, Ser 0.83; Hemoglobin 15.4; Platelets 131.0; Potassium 3.8; Sodium 139    Lipid Panel Lab Results  Component Value Date   CHOL 103 12/29/2015   HDL 63.10 12/29/2015   LDLCALC 26 12/29/2015   TRIG 69.0 12/29/2015      Wt Readings from Last 3 Encounters:  09/22/16 240 lb 8 oz (109.1 kg)  07/19/16 242 lb (109.8 kg)  06/28/16 239 lb (108.4 kg)       ASSESSMENT AND  PLAN:  Atrial flutter, unspecified type ([place]) - Plan: EKG 12-Lead Atrial fibrillation on today's visit, chronic, on anticoagulation, asymptomatic  Coronary artery disease involving native coronary artery of native heart without angina pectoris  CT coronary calcium score of 1300  negative stress test indicating no severe stenoses He denies any symptoms concerning for angina aggressive management of his cholesterol and diabetes  Hyperlipidemia Cholesterol is at goal on the current lipid regimen. No changes to the medications were made.  Essential hypertension Blood pressure is well controlled on today's visit. No changes made to the medications.  Type 2 diabetes, controlled, with neuropathy ([place]) Recommended exercise program, dietary restrictions efforts to lose weight.   Total encounter time more than 25 minutes  Greater than 50% was spent in counseling and coordination of care with the patient  Disposition:   F/U  12 months   No orders of the defined types were placed in this encounter.    Signed, Esmond Plants, M.D., Ph.D. 09/22/2016  [place], [place]

## 2016-09-22 ENCOUNTER — Ambulatory Visit (INDEPENDENT_AMBULATORY_CARE_PROVIDER_SITE_OTHER): Payer: Medicare Other | Admitting: Cardiovascular Disease

## 2016-09-22 ENCOUNTER — Encounter: Payer: Self-pay | Admitting: Cardiovascular Disease

## 2016-09-22 VITALS — BP 122/70 | HR 56 | Ht 71.0 in | Wt 240.5 lb

## 2016-09-22 DIAGNOSIS — E782 Mixed hyperlipidemia: Secondary | ICD-10-CM | POA: Diagnosis not present

## 2016-09-22 DIAGNOSIS — I10 Essential (primary) hypertension: Secondary | ICD-10-CM

## 2016-09-22 DIAGNOSIS — E1140 Type 2 diabetes mellitus with diabetic neuropathy, unspecified: Secondary | ICD-10-CM | POA: Diagnosis not present

## 2016-09-22 DIAGNOSIS — I4892 Unspecified atrial flutter: Secondary | ICD-10-CM | POA: Diagnosis not present

## 2016-09-22 DIAGNOSIS — I495 Sick sinus syndrome: Secondary | ICD-10-CM | POA: Diagnosis not present

## 2016-09-22 NOTE — Patient Instructions (Addendum)
Compression hose   Medication Instructions:   No medication changes made  Labwork:  No new labs needed  Testing/Procedures:  No further testing at this time   Follow-Up: It was a pleasure seeing you in the office today. Please call us if you have new issues that need to be addressed before your next appt.  [phone]  Your physician wants you to follow-up in: 12 months.  You will receive a reminder letter in the mail two months in advance. If you don't receive a letter, please call our office to schedule the follow-up appointment.  If you need a refill on your cardiac medications before your next appointment, please call your pharmacy.

## 2016-10-03 DIAGNOSIS — M1711 Unilateral primary osteoarthritis, right knee: Secondary | ICD-10-CM | POA: Diagnosis not present

## 2016-10-03 DIAGNOSIS — M25561 Pain in right knee: Secondary | ICD-10-CM | POA: Diagnosis not present

## 2016-10-17 ENCOUNTER — Ambulatory Visit (INDEPENDENT_AMBULATORY_CARE_PROVIDER_SITE_OTHER): Payer: Medicare Other

## 2016-10-17 DIAGNOSIS — I4892 Unspecified atrial flutter: Secondary | ICD-10-CM | POA: Diagnosis not present

## 2016-10-17 DIAGNOSIS — Z5181 Encounter for therapeutic drug level monitoring: Secondary | ICD-10-CM | POA: Diagnosis not present

## 2016-10-17 DIAGNOSIS — H18423 Band keratopathy, bilateral: Secondary | ICD-10-CM | POA: Diagnosis not present

## 2016-10-17 LAB — POCT INR: INR: 1.9

## 2016-10-17 LAB — HM DIABETES EYE EXAM

## 2016-10-18 ENCOUNTER — Encounter: Payer: Self-pay | Admitting: Internal Medicine

## 2016-10-21 ENCOUNTER — Encounter (INDEPENDENT_AMBULATORY_CARE_PROVIDER_SITE_OTHER): Payer: Self-pay | Admitting: Vascular Surgery

## 2016-10-21 ENCOUNTER — Ambulatory Visit (INDEPENDENT_AMBULATORY_CARE_PROVIDER_SITE_OTHER): Payer: Medicare Other | Admitting: Vascular Surgery

## 2016-10-21 VITALS — BP 133/73 | HR 54 | Resp 18 | Ht 70.0 in | Wt 240.0 lb

## 2016-10-21 DIAGNOSIS — E1140 Type 2 diabetes mellitus with diabetic neuropathy, unspecified: Secondary | ICD-10-CM

## 2016-10-21 DIAGNOSIS — I4892 Unspecified atrial flutter: Secondary | ICD-10-CM

## 2016-10-21 DIAGNOSIS — I872 Venous insufficiency (chronic) (peripheral): Secondary | ICD-10-CM | POA: Diagnosis not present

## 2016-10-21 DIAGNOSIS — E782 Mixed hyperlipidemia: Secondary | ICD-10-CM | POA: Diagnosis not present

## 2016-10-21 NOTE — Assessment & Plan Note (Signed)
Doing well after previous laser ablation.  Continue to wear compression stockings as needed.  At this point, he can return on an as-needed basis.

## 2016-10-21 NOTE — Progress Notes (Signed)
MRN : [id number]  Michael [facility] is a 83 y.o. (13-Mar-1930) male who presents with chief complaint of  Chief Complaint  Patient presents with  . Follow-up    6 months no studies  .  History of Present Illness: Patient returns today in follow up of venous disease.  Earlier this year, he underwent right great and small saphenous vein laser ablation for symptomatic reflux with swelling and ulceration.  His swelling is improved.  He has had a couple of shallow ulcerations over the past several months, but nothing of significance.  He is wearing low therapeutic compression stockings with basically no right leg symptoms today.  He has chronic left leg swelling and has had multiple previous surgeries on that side.  Current Outpatient Prescriptions  Medication Sig Dispense Refill  . atorvastatin (LIPITOR) 80 MG tablet Take 1 tablet (80 mg total) by mouth daily. 90 tablet 3  . docusate sodium (COLACE) 100 MG capsule Take 100 mg by mouth 2 (two) times daily.     Marland Kitchen ezetimibe (ZETIA) 10 MG tablet     . famotidine (PEPCID) 20 MG tablet Take 20 mg by mouth daily.     . finasteride (PROSCAR) 5 MG tablet TAKE ONE TABLET BY MOUTH ONCE DAILY 90 tablet 1  . glipiZIDE (GLUCOTROL XL) 5 MG 24 hr tablet Take 1 tablet (5 mg total) by mouth daily with breakfast. 90 tablet 1  . Glucosamine Sulfate-MSM (GLUCOSAMINE-MSM DS) 500-500 MG TABS Take 1,500 mg by mouth daily.     Marland Kitchen glucose blood (BAYER CONTOUR TEST) test strip USE STRIP TO TEST BLOOD SUGAR TWICE DAILY AS INSTRUCTED. Dx Code: E11.40 (Patient taking differently: 1 each daily. USE STRIP TO TEST BLOOD SUGAR ONCE DAILY AS INSTRUCTED. Dx Code: E11.40) 100 each 11  . ibuprofen (ADVIL,MOTRIN) 200 MG tablet Take 400 mg by mouth 2 (two) times daily.     Marland Kitchen loratadine (CLARITIN) 10 MG tablet Take 10 mg by mouth daily.      . metFORMIN (GLUCOPHAGE) 500 MG tablet TAKE ONE TABLET BY MOUTH TWICE DAILY WITH  A  MEAL 180 tablet 3  . MICROLET LANCETS MISC Use to  test twice daily or as directed 100 each 3  . tamsulosin (FLOMAX) 0.4 MG CAPS capsule TAKE ONE CAPSULE BY MOUTH ONCE DAILY 90 capsule 3  . triamterene-hydrochlorothiazide (DYAZIDE) 37.5-25 MG capsule TAKE ONE CAPSULE BY MOUTH IN THE MORNING 90 capsule 3  . warfarin (COUMADIN) 5 MG tablet TAKE ONE TO ONE & ONE-HALF TABLETS BY MOUTH ONCE DAILY AS DIRECTED 135 tablet 1   No current facility-administered medications for this visit.         Past Medical History:  Diagnosis Date  . Asthma   . Atrial flutter ([place]) 2007  . Band keratopathy   . Benign prostatic hypertrophy   . Chronic ear infection   . Chronic kidney disease   . Chronic prostatitis   . Dental bridge present    permanent - upper  . Diabetes mellitus   . Elbow stiffness, left    s/p fracture many yrs ago.  arm does not straighten  . GERD (gastroesophageal reflux disease)    laryngeal involvement  . Hyperlipidemia   . Hypertension   . Obstructive sleep apnea    CPAP-9  . Osteoarthrosis, localized, primary, knee    post-traumatic  . Prostatitis   . Stroke [facility])    TIA  . Tachy-brady syndrome ([place])   . UTI (urinary tract infection)  Past Surgical History:  Procedure Laterality Date  . APPENDECTOMY    . BELPHAROPTOSIS REPAIR     Dr [name]---didn't resolve weepy eye and eyelid drooping  . CARDIOVERSION  04/13/2010  . CATARACT EXTRACTION, BILATERAL  2009  . Chest pain  8/12   Stress test benign  . ESOPHAGEAL DILATION  05/26/2016   Procedure: ESOPHAGEAL DILATION;  Surgeon: Lucilla Lame, MD;  Location: [place];  Service: Endoscopy;;  . ESOPHAGOGASTRODUODENOSCOPY (EGD) WITH PROPOFOL N/A 05/26/2016   Procedure: ESOPHAGOGASTRODUODENOSCOPY (EGD) WITH PROPOFOL;  Surgeon: Lucilla Lame, MD;  Location: [place];  Service: Endoscopy;  Laterality: N/A;  Diabetic - oral meds sleep apnea  . EYE SURGERY    . FRACTURE SURGERY Left    elbow  . KNEE SURGERY   1998   plate after fracture, then removed for infection  . left elbow surgery    . MASTOIDECTOMY  8/08   Dr Idelle Crouch  . RHINOPLASTY  5/10   and septoplasty  . SUBACROMIAL DECOMPRESSION Right 2005   Arthroscopic (for rotator cuff and biceps tendon ruptures)  . TEAR DUCT PROBING  11/13   Dr Vickki Muff  . TOTAL KNEE ARTHROPLASTY Left 09/01/2014   Procedure: TOTAL KNEE ARTHROPLASTY;  Surgeon: Dereck Leep, MD;  Location: [facility];  Service: Orthopedics;  Laterality: Left;  . TRIGGER FINGER RELEASE Left 02/25/2015   Procedure: LEFT LONG TRIGGER RELEASE;  Surgeon: Dereck Leep, MD;  Location: [facility];  Service: Orthopedics;  Laterality: Left;  Marland Kitchen VENOUS ABLATION            Family History  Problem Relation Age of Onset  . Colon cancer Father   . Diabetes Father   . Hypertension Father   . Other Mother     natural causes  . Heart attack Neg Hx   . Stroke Neg Hx     Social History       Social History  Substance Use Topics  . Smoking status: Former Smoker    Packs/day: 1.00    Years: 0.00    Types: Cigarettes    Quit date: 01/03/1969  . Smokeless tobacco: Never Used  . Alcohol use No     Comment: rare wine  Married      Allergies  Allergen Reactions  . Enoxaparin Sodium Hives  . Latex Rash  . Nickel Rash  . Percocet [Oxycodone-Acetaminophen] Rash        REVIEW OF SYSTEMS(Negative unless checked)  Constitutional: [] Weight loss[] Fever[] Chills Cardiac:[] Chest pain[] Chest pressure[] Palpitations [] Shortness of breath when laying flat [] Shortness of breath at rest [] Shortness of breath with exertion. Vascular: [] Pain in legs with walking[] Pain in legsat rest[] Pain in legs when laying flat [] Claudication [] Pain in feet when walking [] Pain in feet at rest [] Pain in feet when laying flat [] History of DVT [] Phlebitis [x] Swelling in legs [x] Varicose veins [x] Non-healing  ulcers Pulmonary: [] Uses home oxygen [] Productive cough[] Hemoptysis [] Wheeze [] COPD [] Asthma Neurologic: [] Dizziness [] Blackouts [] Seizures [] History of stroke [] History of TIA[] Aphasia [] Temporary blindness[] Dysphagia [] Weaknessor numbness in arms [] Weakness or numbnessin legs Musculoskeletal: [x] Arthritis [] Joint swelling [] Joint pain [] Low back pain Hematologic:[] Easy bruising[] Easy bleeding [] Hypercoagulable state [] Anemic [] Hepatitis Gastrointestinal:[] Blood in stool[] Vomiting blood[] Gastroesophageal reflux/heartburn[] Difficulty swallowing  Genitourinary: [] Chronic kidney disease [] Difficulturination [] Frequenturination [] Burning with urination[] Blood in urine Skin: [] Rashes [x] Ulcers [x] Wounds Psychological: [] History of anxiety[] History of major depression.   Physical Examination  There were no vitals taken for this visit. Gen:  WD/WN, NAD.  Appears younger than stated age Head: [place]/AT, No temporalis wasting. Ear/Nose/Throat: Hearing grossly intact, nares w/o erythema or drainage, trachea midline Eyes:  Conjunctiva clear. Sclera non-icteric Neck: Supple.  No JVD.  Pulmonary:  Good air movement, no use of accessory muscles.  Cardiac: RRR, no JVD Vascular:  Vessel Right Left  Radial Palpable Palpable                                    Musculoskeletal: M/S 5/5 throughout.  No deformity or atrophy.  Trace right lower extremity edema, 1-2+ left lower extremity edema. Neurologic: Sensation grossly intact in extremities.  Symmetrical.  Speech is fluent.  Psychiatric: Judgment intact, Mood & affect appropriate for pt's clinical situation. Dermatologic: No rashes or ulcers noted.  No cellulitis or open wounds.       Labs Recent Results (from the past 2160 hour(s))  POCT INR     Status: None   Collection Time: 07/27/16 10:06 AM  Result Value Ref Range   INR 3.3   POCT INR     Status: None    Collection Time: 08/17/16  9:06 AM  Result Value Ref Range   INR 4.6   POCT INR     Status: None   Collection Time: 08/31/16  8:55 AM  Result Value Ref Range   INR 2.4   POCT INR     Status: None   Collection Time: 09/19/16 10:15 AM  Result Value Ref Range   INR 2.2   [facility] DIABETES EYE EXAM     Status: None   Collection Time: 10/17/16 12:00 AM  Result Value Ref Range   [facility] Diabetic Eye Exam No Retinopathy No Retinopathy  POCT INR     Status: None   Collection Time: 10/17/16  8:53 AM  Result Value Ref Range   INR 1.9     Radiology No results found.   Assessment/Plan Atrial flutter On anticoagulation  Hypertension blood pressure control important in reducing the progression of atherosclerotic disease. On appropriate oral medications.   Type 2 diabetes, controlled, with neuropathy (HCC) blood glucose control important in reducing the progression of atherosclerotic disease. Also, involved in wound healing. On appropriate medications.   Hyperlipidemia lipid control important in reducing the progression of atherosclerotic disease. Continue statin therapy  No problem-specific Assessment & Plan notes found for this encounter.    Leotis Pain, MD  10/21/2016 11:05 AM    This note was created with Dragon medical transcription system.  Any errors from dictation are purely unintentional

## 2016-11-01 ENCOUNTER — Other Ambulatory Visit: Payer: Self-pay | Admitting: Internal Medicine

## 2016-11-01 ENCOUNTER — Telehealth: Payer: Self-pay | Admitting: Cardiovascular Disease

## 2016-11-01 DIAGNOSIS — R911 Solitary pulmonary nodule: Secondary | ICD-10-CM

## 2016-11-01 NOTE — Telephone Encounter (Addendum)
Spoke w/ pt.  Reviewed w/ him the results of his CT calcium score in April 2017 where a 3 mm nodule was found in the left upper lobe.  It was advised by the radiologist at that time:  "3 mm nodule is identified in the left upper lobe. No follow-up needed if patient is low-risk. Non-contrast chest CT can be considered in 12 months if patient is high-risk. " Advised pt that these nodules are a common finding on a lot of the CT calcium scores and that most of these are benign, but that Dr. Mortimer Fries, being a lung doctor, wants to make sure that the nodule has not gotten bigger.  He verbalizes understanding and is appreciative of the explanation.  Asked him to call back w/ any further questions or concerns.

## 2016-11-01 NOTE — Telephone Encounter (Signed)
Pt calling stating he has a CT to do before seeing Dr Mortimer Fries on Thursday  He states he was advised by nurses of Dr Mortimer Fries that this CT was needed for when patient did CT Calcium score in April that we found a nodule  Thus needing a CT again   Would like some more information on that test he did. He states Dr Rockey Situ told him he was okay   Please call back

## 2016-11-02 ENCOUNTER — Ambulatory Visit
Admission: RE | Admit: 2016-11-02 | Discharge: 2016-11-02 | Disposition: A | Discharge status: Home / Self Care | Payer: Medicare Other | Source: Ambulatory Visit | Attending: Internal Medicine | Admitting: Internal Medicine

## 2016-11-02 DIAGNOSIS — I700 Atherosclerosis of aorta: Secondary | ICD-10-CM | POA: Diagnosis not present

## 2016-11-02 DIAGNOSIS — I2510 Atherosclerotic heart disease of native coronary artery without angina pectoris: Secondary | ICD-10-CM | POA: Insufficient documentation

## 2016-11-02 DIAGNOSIS — R911 Solitary pulmonary nodule: Secondary | ICD-10-CM | POA: Insufficient documentation

## 2016-11-02 DIAGNOSIS — K8020 Calculus of gallbladder without cholecystitis without obstruction: Secondary | ICD-10-CM | POA: Insufficient documentation

## 2016-11-03 ENCOUNTER — Ambulatory Visit: Payer: Medicare Other | Admitting: Internal Medicine

## 2016-11-11 ENCOUNTER — Other Ambulatory Visit: Payer: Self-pay | Admitting: Cardiovascular Disease

## 2016-11-14 ENCOUNTER — Ambulatory Visit (INDEPENDENT_AMBULATORY_CARE_PROVIDER_SITE_OTHER): Payer: Medicare Other

## 2016-11-14 DIAGNOSIS — I4892 Unspecified atrial flutter: Secondary | ICD-10-CM

## 2016-11-14 DIAGNOSIS — Z5181 Encounter for therapeutic drug level monitoring: Secondary | ICD-10-CM

## 2016-11-14 LAB — POCT INR: INR: 3.2

## 2016-11-16 DIAGNOSIS — L851 Acquired keratosis [keratoderma] palmaris et plantaris: Secondary | ICD-10-CM | POA: Diagnosis not present

## 2016-11-16 DIAGNOSIS — B351 Tinea unguium: Secondary | ICD-10-CM | POA: Diagnosis not present

## 2016-11-16 DIAGNOSIS — E1142 Type 2 diabetes mellitus with diabetic polyneuropathy: Secondary | ICD-10-CM | POA: Diagnosis not present

## 2016-11-18 LAB — HM DIABETES FOOT EXAM

## 2016-11-28 ENCOUNTER — Ambulatory Visit (INDEPENDENT_AMBULATORY_CARE_PROVIDER_SITE_OTHER): Payer: Medicare Other | Admitting: Internal Medicine

## 2016-11-28 ENCOUNTER — Encounter: Payer: Self-pay | Admitting: Internal Medicine

## 2016-11-28 VITALS — BP 140/84 | HR 53 | Resp 16 | Ht 70.0 in | Wt 240.0 lb

## 2016-11-28 DIAGNOSIS — G4730 Sleep apnea, unspecified: Secondary | ICD-10-CM

## 2016-11-28 NOTE — Patient Instructions (Signed)
Continue CPAP therapy 

## 2016-11-28 NOTE — Progress Notes (Signed)
Michael [facility]      Date: 11/28/2016,   MRN# [id number] Michael [facility] [birth year] Code Status:  Code Status History    Date Active Date Inactive Code Status Order ID Comments User Context   09/01/2014  1:28 PM 09/04/2014  6:52 PM Full Code [id number]  Dereck Leep, MD Inpatient     Hosp day:@LENGTHOFSTAYDAYS @ Referring MD: @ATDPROV @     PCP:      Admission                  Current  Michael [facility] is a 83 y.o. old male seen in [place] for sleep apnea at the request of Dr. Silvio Pate.     CHIEF COMPLAINT:   Follow up sleep apnea   HISTORY OF PRESENT ILLNESS  Patient here for follow up autocpap therapy  autoCPAP 10-16cm h20 Patient has had mask issues nasal pillows and doing really well Has had to use nasal sprays but working well Compliance reports show excessive leaks still AHI score is 6.3  with therapy  No other resp issues at this time PFTRatio 73% fev1 80%, flow volumes loops show concavity  No signs of infection at this time     Current Medication:  Current Outpatient Medications:  .  atorvastatin (LIPITOR) 80 MG tablet, TAKE ONE TABLET BY MOUTH ONCE DAILY, Disp: 90 tablet, Rfl: 3 .  docusate sodium (COLACE) 100 MG capsule, Take 100 mg by mouth 2 (two) times daily. , Disp: , Rfl:  .  ezetimibe (ZETIA) 10 MG tablet, , Disp: , Rfl:  .  famotidine (PEPCID) 20 MG tablet, Take 20 mg by mouth daily. , Disp: , Rfl:  .  finasteride (PROSCAR) 5 MG tablet, TAKE 1 TABLET BY MOUTH ONCE DAILY, Disp: 90 tablet, Rfl: 1 .  GLIPIZIDE XL 5 MG 24 hr tablet, TAKE 1 TABLET BY MOUTH ONCE DAILY WITH BREAKFAST, Disp: 90 tablet, Rfl: 1 .  Glucosamine Sulfate-MSM (GLUCOSAMINE-MSM DS) 500-500 MG TABS, Take 1,000 mg by mouth daily. , Disp: , Rfl:  .  glucose blood (BAYER CONTOUR TEST) test strip, USE STRIP TO TEST BLOOD SUGAR TWICE DAILY AS INSTRUCTED. Dx Code: E11.40 (Patient taking differently: 1 each by Other route as needed. ), Disp: 100 each, Rfl:  11 .  ibuprofen (ADVIL,MOTRIN) 200 MG tablet, Take 400 mg by mouth 2 (two) times daily. , Disp: , Rfl:  .  loratadine (CLARITIN) 10 MG tablet, Take 10 mg by mouth daily.  , Disp: , Rfl:  .  metFORMIN (GLUCOPHAGE) 500 MG tablet, TAKE ONE TABLET BY MOUTH TWICE DAILY WITH  A  MEAL, Disp: 180 tablet, Rfl: 3 .  MICROLET LANCETS MISC, Use to test twice daily or as directed (Patient taking differently: Use to test twice weekly or as directed), Disp: 100 each, Rfl: 3 .  tamsulosin (FLOMAX) 0.4 MG CAPS capsule, TAKE ONE CAPSULE BY MOUTH ONCE DAILY, Disp: 90 capsule, Rfl: 3 .  triamterene-hydrochlorothiazide (DYAZIDE) 37.5-25 MG capsule, TAKE ONE CAPSULE BY MOUTH IN THE MORNING, Disp: 90 capsule, Rfl: 3 .  warfarin (COUMADIN) 5 MG tablet, TAKE ONE TO ONE & ONE-HALF TABLETS BY MOUTH ONCE DAILY AS DIRECTED, Disp: 135 tablet, Rfl: 1    ALLERGIES   Adhesive [tape]; Enoxaparin sodium; Latex; Nickel; and Percocet [oxycodone-acetaminophen]     REVIEW OF SYSTEMS   Review of Systems  Constitutional: Negative for chills, diaphoresis, fever, malaise/fatigue and weight loss.  HENT: Negative for congestion.   Respiratory: Negative for cough, hemoptysis, sputum production,  shortness of breath and wheezing.   Cardiovascular: Negative for chest pain, palpitations and orthopnea.  Gastrointestinal: Negative for abdominal pain, heartburn, nausea and vomiting.  Neurological: Negative for weakness.  Endo/Heme/Allergies: Does not bruise/bleed easily.  All other systems reviewed and are negative.  BP 140/84 (BP Location: Left Arm, Cuff Size: Large)   Pulse (!) 53   Resp 16   Ht 5\' 10"  (1.778 m)   Wt 240 lb (108.9 kg)   SpO2 97%   BMI 34.44 kg/m     PHYSICAL EXAM  Physical Exam  Constitutional: He is oriented to person, place, and time. He appears well-developed and well-nourished. No distress.  Cardiovascular: Normal rate, regular rhythm and normal heart sounds.  No murmur heard. Pulmonary/Chest: Effort  normal and breath sounds normal. No stridor. No respiratory distress. He has no wheezes.  Musculoskeletal: Normal range of motion. He exhibits no edema.  Neurological: He is alert and oriented to person, place, and time. No cranial nerve deficit.  Skin: Skin is warm. He is not diaphoretic.  Psychiatric: He has a normal mood and affect.   CT cardiac scoring lung images reviewed LuL 3 MM nodule noted CT chest 11/02/16 I have Independently reviewed images of  CT chest   on 11/28/2016 Interpretation:LUL nodule stable    CT ch    ASSESSMENT/PLAN   83 yo white male with long standing sleep apnea , will continue AUTO CPAP 10-16 cm h20 and assess  Has mild intermittent SOB ,  patient is previous smoker PFT c/w mild COPD Gold Stage A  1.continue auto CPAP 10-16 cm h20 2.incidental 3 MM nodule-follow up shows stable size-no further testing needed 3.no inhalers at this time   Patient  satisfied with Plan of action and management. All questions answered  Corrin Parker, M.D.  [facility]  Medical Director [place] Director [facility] Cardio-Pulmonary Department

## 2016-12-05 ENCOUNTER — Ambulatory Visit (INDEPENDENT_AMBULATORY_CARE_PROVIDER_SITE_OTHER): Payer: Medicare Other

## 2016-12-05 DIAGNOSIS — Z5181 Encounter for therapeutic drug level monitoring: Secondary | ICD-10-CM

## 2016-12-05 DIAGNOSIS — I4892 Unspecified atrial flutter: Secondary | ICD-10-CM

## 2016-12-05 LAB — POCT INR: INR: 3.1

## 2016-12-05 NOTE — Patient Instructions (Signed)
Please have some greens today, then continue taking 1.5 tablets daily except 1 tablet on Mondays, Wednesdays, and Saturdays.   Please be consistent with your green intake.  Recheck in 4 weeks.

## 2016-12-07 ENCOUNTER — Other Ambulatory Visit: Payer: Self-pay | Admitting: Internal Medicine

## 2016-12-28 ENCOUNTER — Ambulatory Visit (INDEPENDENT_AMBULATORY_CARE_PROVIDER_SITE_OTHER): Payer: Medicare Other | Admitting: Internal Medicine

## 2016-12-28 ENCOUNTER — Encounter: Payer: Self-pay | Admitting: Internal Medicine

## 2016-12-28 VITALS — BP 122/64 | HR 78 | Temp 97.7°F | Ht 69.75 in | Wt 239.8 lb

## 2016-12-28 DIAGNOSIS — E782 Mixed hyperlipidemia: Secondary | ICD-10-CM | POA: Diagnosis not present

## 2016-12-28 DIAGNOSIS — E1140 Type 2 diabetes mellitus with diabetic neuropathy, unspecified: Secondary | ICD-10-CM | POA: Diagnosis not present

## 2016-12-28 DIAGNOSIS — I4892 Unspecified atrial flutter: Secondary | ICD-10-CM

## 2016-12-28 DIAGNOSIS — I700 Atherosclerosis of aorta: Secondary | ICD-10-CM

## 2016-12-28 LAB — LIPID PANEL
Cholesterol: 105 mg/dL (ref 0–200)
HDL: 50.9 mg/dL (ref >39.00)
LDL Cholesterol: 36 mg/dL (ref 0–99)
NonHDL: 53.61
Total CHOL/HDL Ratio: 2
Triglycerides: 89 mg/dL (ref 0.0–149.0)
VLDL: 17.8 mg/dL (ref 0.0–40.0)

## 2016-12-28 LAB — HEMOGLOBIN A1C: Hgb A1c MFr Bld: 7.5 % — ABNORMAL HIGH (ref 4.6–6.5)

## 2016-12-28 NOTE — Progress Notes (Signed)
Subjective:    Patient ID: Michael [facility], male    DOB: 01/17/1930, 83 y.o.   MRN: [id number]  HPI Here for follow up of diabetes and other chronic health conditions  Checks sugars once a week Usually under 130 Rarely under 100--no hypoglycemic symptoms Feet numb--occasional brief pain in bed  Continues on warfarin for his atrial flutter Done at cardiologist Never had palpitations No chest pain or SOB No change in exercise tolerance  Had aortic atherosclerosis found on CT scan (for nodules) No leg pain other than arthritic right knee  Current Outpatient Medications on File Prior to Visit  Medication Sig Dispense Refill  . atorvastatin (LIPITOR) 80 MG tablet TAKE ONE TABLET BY MOUTH ONCE DAILY 90 tablet 3  . docusate sodium (COLACE) 100 MG capsule Take 100 mg by mouth 2 (two) times daily.     . famotidine (PEPCID) 20 MG tablet Take 20 mg by mouth daily.     . finasteride (PROSCAR) 5 MG tablet TAKE 1 TABLET BY MOUTH ONCE DAILY 90 tablet 1  . GLIPIZIDE XL 5 MG 24 hr tablet TAKE 1 TABLET BY MOUTH ONCE DAILY WITH BREAKFAST 90 tablet 1  . Glucosamine Sulfate-MSM (GLUCOSAMINE-MSM DS) 500-500 MG TABS Take 1,000 mg by mouth daily.     Marland Kitchen glucose blood (BAYER CONTOUR TEST) test strip USE STRIP TO TEST BLOOD SUGAR TWICE DAILY AS INSTRUCTED. Dx Code: E11.40 (Patient taking differently: 1 each by Other route as needed. ) 100 each 11  . ibuprofen (ADVIL,MOTRIN) 200 MG tablet Take 400 mg by mouth 2 (two) times daily.     Marland Kitchen loratadine (CLARITIN) 10 MG tablet Take 10 mg by mouth daily.      . metFORMIN (GLUCOPHAGE) 500 MG tablet TAKE ONE TABLET BY MOUTH TWICE DAILY WITH  A  MEAL 180 tablet 3  . MICROLET LANCETS MISC Use to test twice daily or as directed (Patient taking differently: Use to test twice weekly or as directed) 100 each 3  . tamsulosin (FLOMAX) 0.4 MG CAPS capsule TAKE ONE CAPSULE BY MOUTH ONCE DAILY 90 capsule 3  . triamterene-hydrochlorothiazide (DYAZIDE) 37.5-25 MG capsule TAKE ONE  CAPSULE BY MOUTH IN THE MORNING 90 capsule 3  . warfarin (COUMADIN) 5 MG tablet TAKE ONE TO ONE & ONE-HALF TABLETS BY MOUTH ONCE DAILY AS DIRECTED 135 tablet 1   No current facility-administered medications on file prior to visit.     Allergies  Allergen Reactions  . Adhesive [Tape] Rash  . Enoxaparin Sodium Hives  . Latex Rash    Has trouble with BANDAIDS that have been left on for more than 24 hours. Prefers paper tape.  . Nickel Rash  . Percocet [Oxycodone-Acetaminophen] Rash    Past Medical History:  Diagnosis Date  . Asthma   . Atrial flutter ([place]) 2007  . Band keratopathy   . Benign prostatic hypertrophy   . Chronic ear infection   . Chronic kidney disease   . Chronic prostatitis   . Dental bridge present    permanent - upper  . Diabetes mellitus   . Elbow stiffness, left    s/p fracture many yrs ago.  arm does not straighten  . GERD (gastroesophageal reflux disease)    laryngeal involvement  . Hyperlipidemia   . Hypertension   . Obstructive sleep apnea    CPAP-9  . Osteoarthrosis, localized, primary, knee    post-traumatic  . Prostatitis   . Stroke [facility])    TIA  . Tachy-brady syndrome ([place])   .  UTI (urinary tract infection)     Past Surgical History:  Procedure Laterality Date  . APPENDECTOMY    . BELPHAROPTOSIS REPAIR     Dr [name]---didn't resolve weepy eye and eyelid drooping  . CARDIOVERSION  04/13/2010  . CATARACT EXTRACTION, BILATERAL  2009  . Chest pain  8/12   Stress test benign  . ESOPHAGEAL DILATION  05/26/2016   Procedure: ESOPHAGEAL DILATION;  Surgeon: Lucilla Lame, MD;  Location: [place];  Service: Endoscopy;;  . ESOPHAGOGASTRODUODENOSCOPY (EGD) WITH PROPOFOL N/A 05/26/2016   Procedure: ESOPHAGOGASTRODUODENOSCOPY (EGD) WITH PROPOFOL;  Surgeon: Lucilla Lame, MD;  Location: [place];  Service: Endoscopy;  Laterality: N/A;  Diabetic - oral meds sleep apnea  . EYE SURGERY    . FRACTURE SURGERY Left    elbow  . KNEE  SURGERY  1998   plate after fracture, then removed for infection  . left elbow surgery    . MASTOIDECTOMY  8/08   Dr Idelle Crouch  . RHINOPLASTY  5/10   and septoplasty  . SUBACROMIAL DECOMPRESSION Right 2005   Arthroscopic (for rotator cuff and biceps tendon ruptures)  . TEAR DUCT PROBING  11/13   Dr Vickki Muff  . TOTAL KNEE ARTHROPLASTY Left 09/01/2014   Procedure: TOTAL KNEE ARTHROPLASTY;  Surgeon: Dereck Leep, MD;  Location: [facility];  Service: Orthopedics;  Laterality: Left;  . TRIGGER FINGER RELEASE Left 02/25/2015   Procedure: LEFT LONG TRIGGER RELEASE;  Surgeon: Dereck Leep, MD;  Location: [facility];  Service: Orthopedics;  Laterality: Left;  Marland Kitchen VENOUS ABLATION      Family History  Problem Relation Age of Onset  . Colon cancer Father   . Diabetes Father   . Hypertension Father   . Other Mother        natural causes  . Heart attack Neg Hx   . Stroke Neg Hx     Social History   Socioeconomic History  . Marital status: Married    Spouse name: Not on file  . Number of children: 2  . Years of education: Not on file  . Highest education level: Not on file  Social Needs  . Financial resource strain: Not on file  . Food insecurity - worry: Not on file  . Food insecurity - inability: Not on file  . Transportation needs - medical: Not on file  . Transportation needs - non-medical: Not on file  Occupational History  . Occupation: Theme park manager    Comment: Retired--[employer]  Tobacco Use  . Smoking status: Former Smoker    Packs/day: 1.00    Years: 0.00    Pack years: 0.00    Types: Cigarettes    Last attempt to quit: 01/03/1969    Years since quitting: 48.0  . Smokeless tobacco: Never Used  Substance and Sexual Activity  . Alcohol use: No    Alcohol/week: 0.6 oz    Types: 1 Glasses of wine per week    Comment: rare wine. 1-2x/yr.  . Drug use: No  . Sexual activity: Not on file  Other Topics Concern  . Not on file  Social History Narrative   Has living will   Health  care POA-- wife and then son Shanon Brow   Has DNR order from the past---form redone 06/25/10   Probably no feeding tube if cognitively unaware   Review of Systems Sleeps well Appetite is good Weight fairly stable    Objective:   Physical Exam  Constitutional: He appears well-developed. No distress.  Neck: No  thyromegaly present.  Cardiovascular: Normal rate, regular rhythm, normal heart sounds and intact distal pulses. Exam reveals no gallop.  No murmur heard. Pulmonary/Chest: Effort normal and breath sounds normal. No respiratory distress. He has no wheezes. He has no rales.  Musculoskeletal: He exhibits no edema or tenderness.  Neurological:  Mildly decreased sensation in feet  Skin: No rash noted. No erythema.  No foot lesions  Psychiatric: He has a normal mood and affect. His behavior is normal.          Assessment & Plan:

## 2016-12-28 NOTE — Assessment & Plan Note (Signed)
No problems on statin 

## 2016-12-28 NOTE — Assessment & Plan Note (Signed)
Found on CT scan No symptoms On statin and warfarin

## 2016-12-28 NOTE — Assessment & Plan Note (Signed)
On warfarin No symptoms

## 2016-12-28 NOTE — Assessment & Plan Note (Signed)
Seems to still have acceptable control Will check labs

## 2017-01-02 ENCOUNTER — Ambulatory Visit (INDEPENDENT_AMBULATORY_CARE_PROVIDER_SITE_OTHER): Payer: Medicare Other

## 2017-01-02 DIAGNOSIS — Z5181 Encounter for therapeutic drug level monitoring: Secondary | ICD-10-CM | POA: Diagnosis not present

## 2017-01-02 DIAGNOSIS — I4892 Unspecified atrial flutter: Secondary | ICD-10-CM | POA: Diagnosis not present

## 2017-01-02 LAB — POCT INR: INR: 1.9

## 2017-01-02 NOTE — Patient Instructions (Signed)
Please take 1.5 tablets today, then continue taking 1.5 tablets daily except 1 tablet on Mondays, Wednesdays, and Saturdays.   Please be consistent with your green intake.  Recheck in 4 weeks.

## 2017-01-06 ENCOUNTER — Other Ambulatory Visit: Payer: Self-pay | Admitting: Internal Medicine

## 2017-01-13 ENCOUNTER — Telehealth: Payer: Self-pay | Admitting: Internal Medicine

## 2017-01-13 NOTE — Telephone Encounter (Signed)
Please call regarding CPAP, pt has not received yet. Pt is requesting a call back before lunch.

## 2017-01-13 NOTE — Telephone Encounter (Signed)
Called and spoke with pt. Pt states he has a friend who is having trouble with her cpap mask. Pt stated he would like for his friend to have an apt with Lynnae Sandhoff for mask fitting. Pt states he contact [facility] regarding this matter and was instructed that a referral is required.  Pt wanted to know if his friends PCP could make this referral . I made pt aware that PCP is able to refer to mask fitting.  I advised pt that he have his friend contact her PCP, as pt does not have a local sleep physician. Per pt his friends sleep study was preformed in [place].   Pt denied needing any assistants with his cpap, as he is doing well with cpap.  Will close encounter, as nothing further is needed.

## 2017-01-16 DIAGNOSIS — D225 Melanocytic nevi of trunk: Secondary | ICD-10-CM | POA: Diagnosis not present

## 2017-01-16 DIAGNOSIS — Z85828 Personal history of other malignant neoplasm of skin: Secondary | ICD-10-CM | POA: Diagnosis not present

## 2017-01-16 DIAGNOSIS — L0292 Furuncle, unspecified: Secondary | ICD-10-CM | POA: Diagnosis not present

## 2017-01-16 DIAGNOSIS — L97321 Non-pressure chronic ulcer of left ankle limited to breakdown of skin: Secondary | ICD-10-CM | POA: Diagnosis not present

## 2017-01-16 DIAGNOSIS — L02219 Cutaneous abscess of trunk, unspecified: Secondary | ICD-10-CM | POA: Diagnosis not present

## 2017-01-25 DIAGNOSIS — L851 Acquired keratosis [keratoderma] palmaris et plantaris: Secondary | ICD-10-CM | POA: Diagnosis not present

## 2017-01-25 DIAGNOSIS — E1142 Type 2 diabetes mellitus with diabetic polyneuropathy: Secondary | ICD-10-CM | POA: Diagnosis not present

## 2017-01-25 DIAGNOSIS — B351 Tinea unguium: Secondary | ICD-10-CM | POA: Diagnosis not present

## 2017-01-30 ENCOUNTER — Ambulatory Visit (INDEPENDENT_AMBULATORY_CARE_PROVIDER_SITE_OTHER): Payer: Medicare Other

## 2017-01-30 DIAGNOSIS — Z5181 Encounter for therapeutic drug level monitoring: Secondary | ICD-10-CM | POA: Diagnosis not present

## 2017-01-30 DIAGNOSIS — I4892 Unspecified atrial flutter: Secondary | ICD-10-CM

## 2017-01-30 LAB — POCT INR: INR: 2.4

## 2017-01-30 NOTE — Patient Instructions (Signed)
Please continue taking 1.5 tablets daily except 1 tablet on Mondays, Wednesdays, and Saturdays.   Please be consistent with your green intake.  Recheck in 4 weeks.

## 2017-02-22 ENCOUNTER — Other Ambulatory Visit: Payer: Self-pay | Admitting: Cardiovascular Disease

## 2017-02-22 NOTE — Telephone Encounter (Signed)
Refill Request.  

## 2017-02-27 ENCOUNTER — Ambulatory Visit (INDEPENDENT_AMBULATORY_CARE_PROVIDER_SITE_OTHER): Payer: Medicare Other

## 2017-02-27 DIAGNOSIS — I4892 Unspecified atrial flutter: Secondary | ICD-10-CM | POA: Diagnosis not present

## 2017-02-27 DIAGNOSIS — Z5181 Encounter for therapeutic drug level monitoring: Secondary | ICD-10-CM | POA: Diagnosis not present

## 2017-02-27 LAB — POCT INR: INR: 1.8

## 2017-02-27 NOTE — Patient Instructions (Signed)
Please take 1.5 tablets today, then continue taking 1.5 tablets daily except 1 tablet on Mondays, Wednesdays, and Saturdays.   Please be consistent with your green intake.  Recheck in 2 weeks.

## 2017-03-01 ENCOUNTER — Telehealth: Payer: Self-pay | Admitting: Cardiovascular Disease

## 2017-03-01 NOTE — Telephone Encounter (Signed)
Pt told his dentist that he would obtain clearance, so their office has not sent over request.

## 2017-03-01 NOTE — Telephone Encounter (Signed)
Pt calling stating he is getting a tooth removed and he is going to need to stop his Coumadin  Was advised by Dr Hampton Abbot at [facility] [phone], he need to make sure when he can stop and restart his coumadin   Please advise

## 2017-03-01 NOTE — Telephone Encounter (Signed)
Patient calling to let us know he needs to hold coum for 3 days   Not scheduled yet for dental procedure needs verbal approval per patient to schedule

## 2017-03-01 NOTE — Telephone Encounter (Addendum)
Pt mentioned this during his INR check on Monday. I advised him at that time that he would not need to hold coumadin if he is having 1 tooth removed, but he stated that he is "having very blood surgery" and Dr. Hampton Abbot wanted him to hold, but did not recommend how long.   If he is cleared to hold, he may restart once cleared by operating MD and take extra 1/2 dose x 2 days and come in for recheck INR the next week.  Routed to Dr. Rockey Situ for clearance.

## 2017-03-02 NOTE — Telephone Encounter (Signed)
Ok to hold for 3 days prior as detailed in the note, Restart when dentist thinks it will be safe (usuallly same day or day or two later)

## 2017-03-03 NOTE — Telephone Encounter (Signed)
Patient wants [place] to fax a clearance note to Dr. Romilda Garret office attention terri and call him to confirm this has been done   [phone] fax

## 2017-03-03 NOTE — Telephone Encounter (Signed)
I spoke with the patient- he is aware that Dr. Rockey Situ has given the ok for him to hold his warfarin for 3 days prior to dental work and this was faxed to Dr. Gayla Doss. Faxed to [phone]- confirmation received.  The patient will send [place] a MyChart message when this is scheduled and he will aware will need to come back 1 week after for a repeat INR check. He voices understanding.

## 2017-03-13 ENCOUNTER — Ambulatory Visit (INDEPENDENT_AMBULATORY_CARE_PROVIDER_SITE_OTHER): Payer: Medicare Other

## 2017-03-13 ENCOUNTER — Other Ambulatory Visit: Payer: Self-pay | Admitting: Internal Medicine

## 2017-03-13 DIAGNOSIS — Z5181 Encounter for therapeutic drug level monitoring: Secondary | ICD-10-CM | POA: Diagnosis not present

## 2017-03-13 DIAGNOSIS — I4892 Unspecified atrial flutter: Secondary | ICD-10-CM | POA: Diagnosis not present

## 2017-03-13 LAB — POCT INR: INR: 2.5

## 2017-03-13 NOTE — Patient Instructions (Signed)
Please continue taking 1.5 tablets daily except 1 tablet on Mondays, Wednesdays, and Saturdays.   Please be consistent with your green intake.  Recheck in 3 weeks.

## 2017-04-03 ENCOUNTER — Ambulatory Visit (INDEPENDENT_AMBULATORY_CARE_PROVIDER_SITE_OTHER): Payer: Medicare Other

## 2017-04-03 DIAGNOSIS — Z5181 Encounter for therapeutic drug level monitoring: Secondary | ICD-10-CM

## 2017-04-03 DIAGNOSIS — I4892 Unspecified atrial flutter: Secondary | ICD-10-CM | POA: Diagnosis not present

## 2017-04-03 LAB — POCT INR: INR: 3.6

## 2017-04-03 NOTE — Patient Instructions (Signed)
Please skip coumadin today,then continue taking 1.5 tablets daily except 1 tablet on Mondays, Wednesdays, and Saturdays.   Please be consistent with your green intake.  Recheck in 3 weeks.

## 2017-04-05 DIAGNOSIS — E1142 Type 2 diabetes mellitus with diabetic polyneuropathy: Secondary | ICD-10-CM | POA: Diagnosis not present

## 2017-04-05 DIAGNOSIS — B351 Tinea unguium: Secondary | ICD-10-CM | POA: Diagnosis not present

## 2017-04-05 DIAGNOSIS — L851 Acquired keratosis [keratoderma] palmaris et plantaris: Secondary | ICD-10-CM | POA: Diagnosis not present

## 2017-04-17 DIAGNOSIS — H18421 Band keratopathy, right eye: Secondary | ICD-10-CM | POA: Diagnosis not present

## 2017-04-24 ENCOUNTER — Ambulatory Visit (INDEPENDENT_AMBULATORY_CARE_PROVIDER_SITE_OTHER): Payer: Medicare Other

## 2017-04-24 DIAGNOSIS — I4892 Unspecified atrial flutter: Secondary | ICD-10-CM

## 2017-04-24 DIAGNOSIS — Z5181 Encounter for therapeutic drug level monitoring: Secondary | ICD-10-CM

## 2017-04-24 LAB — POCT INR: INR: 2.7

## 2017-04-24 NOTE — Patient Instructions (Signed)
Please continue taking 1.5 tablets daily except 1 tablet on Mondays, Wednesdays, and Saturdays.   Please be consistent with your green intake.  Recheck in 4 weeks.

## 2017-05-01 DIAGNOSIS — H18421 Band keratopathy, right eye: Secondary | ICD-10-CM | POA: Diagnosis not present

## 2017-05-22 ENCOUNTER — Ambulatory Visit (INDEPENDENT_AMBULATORY_CARE_PROVIDER_SITE_OTHER): Payer: Medicare Other

## 2017-05-22 DIAGNOSIS — Z5181 Encounter for therapeutic drug level monitoring: Secondary | ICD-10-CM | POA: Diagnosis not present

## 2017-05-22 DIAGNOSIS — I4892 Unspecified atrial flutter: Secondary | ICD-10-CM | POA: Diagnosis not present

## 2017-05-22 LAB — POCT INR: INR: 2.4

## 2017-05-22 NOTE — Patient Instructions (Signed)
Please continue taking 1.5 tablets daily except 1 tablet on Mondays, Wednesdays, and Saturdays.   Please be consistent with your green intake.  Recheck in 5 weeks.

## 2017-05-30 DIAGNOSIS — H18422 Band keratopathy, left eye: Secondary | ICD-10-CM | POA: Diagnosis not present

## 2017-06-09 ENCOUNTER — Telehealth: Payer: Self-pay

## 2017-06-09 MED ORDER — GLUCOSE BLOOD VI STRP: ORAL_STRIP | 3 refills | Status: DC

## 2017-06-09 NOTE — Telephone Encounter (Signed)
Patient came by the office. Patient states that he used to check his sugar 2 times a day but since his sugar has been controlled he was told he did not have to do that often anymore. This was over a year ago. He has been checking his sugar about once a week or once every 10 days. He came by over a year ago to get his [facility] RX updated to not say 2 times daily but it is still saying the same thing and he has some strips that are expired. He would like to please get this corrected now. In the chart still says 2 times daily as needed and states he is getting too many. Patient RX # [id number] but this is an expired Contour glucose strips RX. Patient's CB: [id number]. RX needs to be sent to [address] . Thank you Patient originally wanted to speak to Dr Silvio Pate or Leafy Ro. I told patient I would send this message to [facility] and [place].

## 2017-06-09 NOTE — Telephone Encounter (Signed)
Spoke to pt. Advised him I changed the rx for the strips to ready 1 a day.

## 2017-06-14 DIAGNOSIS — L851 Acquired keratosis [keratoderma] palmaris et plantaris: Secondary | ICD-10-CM | POA: Diagnosis not present

## 2017-06-14 DIAGNOSIS — E1142 Type 2 diabetes mellitus with diabetic polyneuropathy: Secondary | ICD-10-CM | POA: Diagnosis not present

## 2017-06-14 DIAGNOSIS — B351 Tinea unguium: Secondary | ICD-10-CM | POA: Diagnosis not present

## 2017-06-26 ENCOUNTER — Ambulatory Visit (INDEPENDENT_AMBULATORY_CARE_PROVIDER_SITE_OTHER): Payer: Medicare Other

## 2017-06-26 DIAGNOSIS — I4892 Unspecified atrial flutter: Secondary | ICD-10-CM

## 2017-06-26 DIAGNOSIS — Z5181 Encounter for therapeutic drug level monitoring: Secondary | ICD-10-CM

## 2017-06-26 LAB — POCT INR: INR: 2.3 (ref 2.0–3.0)

## 2017-06-26 NOTE — Patient Instructions (Signed)
Please continue taking 1.5 tablets daily except 1 tablet on Mondays, Wednesdays, and Saturdays.   Please be consistent with your green intake.  Recheck in 6 weeks.

## 2017-07-20 ENCOUNTER — Encounter: Payer: Self-pay | Admitting: Internal Medicine

## 2017-07-20 ENCOUNTER — Ambulatory Visit (INDEPENDENT_AMBULATORY_CARE_PROVIDER_SITE_OTHER): Payer: Medicare Other | Admitting: Internal Medicine

## 2017-07-20 VITALS — BP 118/76 | HR 64 | Ht 69.75 in | Wt 232.0 lb

## 2017-07-20 DIAGNOSIS — G4733 Obstructive sleep apnea (adult) (pediatric): Secondary | ICD-10-CM

## 2017-07-20 NOTE — Patient Instructions (Signed)
Continue CPAP as prescribed. 

## 2017-07-20 NOTE — Progress Notes (Signed)
[facility] Consultation      Date: 07/20/2017,   MRN# [id number] Michael Colon 1930/12/06    AdmissionWeight: 232 lb (105.2 kg)                 CurrentWeight: 232 lb (105.2 kg) Michael Colon is a 84 y.o. old male seen in consultation for sleep apnea at the request of Dr. Silvio Pate.     CHIEF COMPLAINT:   Follow up sleep apnea   HISTORY OF PRESENT ILLNESS  Patient here for follow up autocpap therapy  autoCPAP 10-16cm h20 Patient has had mask issues nasal pillows and doing really well Has had to use nasal sprays but working well Compliance reports show excessive leaks still AHI score is 6.9  with therapy  No other resp issues at this time PFTRatio 73% fev1 80%, flow volumes loops show concavity  No signs of infection at this time AHI is 30 without therapy    Current Medication:  Current Outpatient Medications:  .  atorvastatin (LIPITOR) 80 MG tablet, TAKE ONE TABLET BY MOUTH ONCE DAILY, Disp: 90 tablet, Rfl: 3 .  docusate sodium (COLACE) 100 MG capsule, Take 100 mg by mouth 2 (two) times daily. , Disp: , Rfl:  .  famotidine (PEPCID) 20 MG tablet, Take 20 mg by mouth daily. , Disp: , Rfl:  .  finasteride (PROSCAR) 5 MG tablet, TAKE 1 TABLET BY MOUTH ONCE DAILY, Disp: 90 tablet, Rfl: 3 .  GLIPIZIDE XL 5 MG 24 hr tablet, TAKE 1 TABLET BY MOUTH ONCE DAILY WITH BREAKFAST, Disp: 90 tablet, Rfl: 3 .  Glucosamine Sulfate-MSM (GLUCOSAMINE-MSM DS) 500-500 MG TABS, Take 1,000 mg by mouth daily. , Disp: , Rfl:  .  glucose blood (BAYER CONTOUR TEST) test strip, USE STRIP TO TEST BLOOD SUGAR ONCE DAILY. Dx Code: E11.40, Disp: 100 each, Rfl: 3 .  ibuprofen (ADVIL,MOTRIN) 200 MG tablet, Take 400 mg by mouth 2 (two) times daily. , Disp: , Rfl:  .  loratadine (CLARITIN) 10 MG tablet, Take 10 mg by mouth daily.  , Disp: , Rfl:  .  metFORMIN (GLUCOPHAGE) 500 MG tablet, TAKE ONE TABLET BY MOUTH TWICE DAILY WITH MEALS, Disp: 180 tablet, Rfl: 3 .  MICROLET LANCETS MISC, Use  to test twice daily or as directed (Patient taking differently: Use to test twice weekly or as directed), Disp: 100 each, Rfl: 3 .  tamsulosin (FLOMAX) 0.4 MG CAPS capsule, TAKE ONE CAPSULE BY MOUTH ONCE DAILY, Disp: 90 capsule, Rfl: 3 .  triamterene-hydrochlorothiazide (DYAZIDE) 37.5-25 MG capsule, TAKE ONE CAPSULE BY MOUTH IN THE MORNING, Disp: 90 capsule, Rfl: 3 .  warfarin (COUMADIN) 5 MG tablet, TAKE 1 TO 1 & 1/2 (ONE TO ONE & ONE-HALF) TABLETS BY MOUTH ONCE DAILY AS DIRECTED, Disp: 135 tablet, Rfl: 1    ALLERGIES   Adhesive [tape]; Enoxaparin sodium; Latex; Nickel; and Percocet [oxycodone-acetaminophen]     REVIEW OF SYSTEMS   Review of Systems  Constitutional: Negative for chills, diaphoresis, fever, malaise/fatigue and weight loss.  HENT: Negative for congestion.   Respiratory: Negative for cough, hemoptysis, sputum production, shortness of breath and wheezing.   Cardiovascular: Negative for chest pain, palpitations and orthopnea.  Gastrointestinal: Negative for abdominal pain, heartburn, nausea and vomiting.  Neurological: Negative for weakness.  Endo/Heme/Allergies: Does not bruise/bleed easily.  All other systems reviewed and are negative.  BP 118/76 (BP Location: Left Arm, Cuff Size: Normal)   Pulse 64   Ht 5' 9.75" (1.772 m)   Wt  232 lb (105.2 kg)   SpO2 98%   BMI 33.53 kg/m     PHYSICAL EXAM  Physical Exam  Constitutional: He is oriented to person, place, and time. He appears well-developed and well-nourished. No distress.  Cardiovascular: Normal rate, regular rhythm and normal heart sounds.  No murmur heard. Pulmonary/Chest: Effort normal and breath sounds normal. No stridor. No respiratory distress. He has no wheezes.  Musculoskeletal: Normal range of motion. He exhibits no edema.  Neurological: He is alert and oriented to person, place, and time. No cranial nerve deficit.  Skin: Skin is warm. He is not diaphoretic.  Psychiatric: He has a normal mood and  affect.   CT cardiac scoring lung images reviewed LuL 3 MM nodule noted CT chest 11/02/16 I have Independently reviewed images of  CT chest    Interpretation:LUL nodule stable    CT ch    ASSESSMENT/PLAN   83 yo white male with long standing sleep apnea , will continue AUTO CPAP 10-16 cm h20 and assess  Has mild intermittent SOB ,  patient is previous smoker PFT c/w mild COPD Gold Stage A  1.continue auto CPAP 10-16 cm h20 2.incidental 3 MM nodule-follow up shows stable size-no further testing needed 3.no inhalers at this time   Patient  satisfied with Plan of action and management. All questions answered  Corrin Parker, M.D.  [facility]  Medical Director [place] Director [facility] Cardio-Pulmonary Department

## 2017-08-07 ENCOUNTER — Ambulatory Visit (INDEPENDENT_AMBULATORY_CARE_PROVIDER_SITE_OTHER): Payer: Medicare Other

## 2017-08-07 DIAGNOSIS — Z5181 Encounter for therapeutic drug level monitoring: Secondary | ICD-10-CM | POA: Diagnosis not present

## 2017-08-07 DIAGNOSIS — I4892 Unspecified atrial flutter: Secondary | ICD-10-CM

## 2017-08-07 LAB — POCT INR: INR: 2.3 (ref 2.0–3.0)

## 2017-08-07 NOTE — Patient Instructions (Signed)
Please continue taking 1.5 tablets daily except 1 tablet on Mondays, Wednesdays, and Saturdays.   Please be consistent with your green intake.  Recheck in 6 weeks.

## 2017-08-21 DIAGNOSIS — E1142 Type 2 diabetes mellitus with diabetic polyneuropathy: Secondary | ICD-10-CM | POA: Diagnosis not present

## 2017-08-21 DIAGNOSIS — L851 Acquired keratosis [keratoderma] palmaris et plantaris: Secondary | ICD-10-CM | POA: Diagnosis not present

## 2017-08-21 DIAGNOSIS — B351 Tinea unguium: Secondary | ICD-10-CM | POA: Diagnosis not present

## 2017-09-07 DIAGNOSIS — Z23 Encounter for immunization: Secondary | ICD-10-CM | POA: Diagnosis not present

## 2017-09-10 ENCOUNTER — Other Ambulatory Visit: Payer: Self-pay | Admitting: Cardiovascular Disease

## 2017-09-11 NOTE — Telephone Encounter (Signed)
Please review for refill on warfarin. Thanks! I've already refilled the atorvastatin.

## 2017-09-18 ENCOUNTER — Ambulatory Visit (INDEPENDENT_AMBULATORY_CARE_PROVIDER_SITE_OTHER): Payer: Medicare Other

## 2017-09-18 DIAGNOSIS — I4892 Unspecified atrial flutter: Secondary | ICD-10-CM

## 2017-09-18 DIAGNOSIS — Z5181 Encounter for therapeutic drug level monitoring: Secondary | ICD-10-CM

## 2017-09-18 LAB — POCT INR: INR: 2.4 (ref 2.0–3.0)

## 2017-09-18 NOTE — Patient Instructions (Signed)
Please continue taking 1.5 tablets daily except 1 tablet on Mondays, Wednesdays, and Saturdays.   Please be consistent with your green intake.  Recheck in 6 weeks.

## 2017-09-26 DIAGNOSIS — D2262 Melanocytic nevi of left upper limb, including shoulder: Secondary | ICD-10-CM | POA: Diagnosis not present

## 2017-09-26 DIAGNOSIS — D2271 Melanocytic nevi of right lower limb, including hip: Secondary | ICD-10-CM | POA: Diagnosis not present

## 2017-09-26 DIAGNOSIS — Z85828 Personal history of other malignant neoplasm of skin: Secondary | ICD-10-CM | POA: Diagnosis not present

## 2017-09-26 DIAGNOSIS — D2261 Melanocytic nevi of right upper limb, including shoulder: Secondary | ICD-10-CM | POA: Diagnosis not present

## 2017-09-26 DIAGNOSIS — D485 Neoplasm of uncertain behavior of skin: Secondary | ICD-10-CM | POA: Diagnosis not present

## 2017-09-26 DIAGNOSIS — L570 Actinic keratosis: Secondary | ICD-10-CM | POA: Diagnosis not present

## 2017-09-26 DIAGNOSIS — Z08 Encounter for follow-up examination after completed treatment for malignant neoplasm: Secondary | ICD-10-CM | POA: Diagnosis not present

## 2017-09-26 DIAGNOSIS — D225 Melanocytic nevi of trunk: Secondary | ICD-10-CM | POA: Diagnosis not present

## 2017-09-26 DIAGNOSIS — D2272 Melanocytic nevi of left lower limb, including hip: Secondary | ICD-10-CM | POA: Diagnosis not present

## 2017-09-26 DIAGNOSIS — D0462 Carcinoma in situ of skin of left upper limb, including shoulder: Secondary | ICD-10-CM | POA: Diagnosis not present

## 2017-09-26 DIAGNOSIS — X32XXXA Exposure to sunlight, initial encounter: Secondary | ICD-10-CM | POA: Diagnosis not present

## 2017-09-26 DIAGNOSIS — D0422 Carcinoma in situ of skin of left ear and external auricular canal: Secondary | ICD-10-CM | POA: Diagnosis not present

## 2017-10-30 ENCOUNTER — Ambulatory Visit (INDEPENDENT_AMBULATORY_CARE_PROVIDER_SITE_OTHER): Payer: Medicare Other

## 2017-10-30 DIAGNOSIS — I4892 Unspecified atrial flutter: Secondary | ICD-10-CM

## 2017-10-30 DIAGNOSIS — Z5181 Encounter for therapeutic drug level monitoring: Secondary | ICD-10-CM

## 2017-10-30 LAB — POCT INR: INR: 2.5 (ref 2.0–3.0)

## 2017-10-30 NOTE — Patient Instructions (Signed)
Please continue taking 1.5 tablets daily except 1 tablet on Mondays, Wednesdays, and Saturdays.   Please be consistent with your green intake.  Recheck in 6 weeks.

## 2017-10-31 DIAGNOSIS — L851 Acquired keratosis [keratoderma] palmaris et plantaris: Secondary | ICD-10-CM | POA: Diagnosis not present

## 2017-10-31 DIAGNOSIS — E1142 Type 2 diabetes mellitus with diabetic polyneuropathy: Secondary | ICD-10-CM | POA: Diagnosis not present

## 2017-10-31 DIAGNOSIS — B351 Tinea unguium: Secondary | ICD-10-CM | POA: Diagnosis not present

## 2017-11-15 DIAGNOSIS — D0462 Carcinoma in situ of skin of left upper limb, including shoulder: Secondary | ICD-10-CM | POA: Diagnosis not present

## 2017-11-18 ENCOUNTER — Other Ambulatory Visit: Payer: Self-pay | Admitting: Cardiovascular Disease

## 2017-12-11 ENCOUNTER — Ambulatory Visit (INDEPENDENT_AMBULATORY_CARE_PROVIDER_SITE_OTHER): Payer: Medicare Other

## 2017-12-11 DIAGNOSIS — Z5181 Encounter for therapeutic drug level monitoring: Secondary | ICD-10-CM | POA: Diagnosis not present

## 2017-12-11 DIAGNOSIS — I4892 Unspecified atrial flutter: Secondary | ICD-10-CM | POA: Diagnosis not present

## 2017-12-11 LAB — POCT INR: INR: 3.1 — AB (ref 2.0–3.0)

## 2017-12-11 NOTE — Patient Instructions (Signed)
Please have a large serving of greens today and continue taking 1.5 tablets daily except 1 tablet on Mondays, Wednesdays, and Saturdays.   Please be consistent with your green intake.  Recheck in 6 weeks.

## 2017-12-29 ENCOUNTER — Other Ambulatory Visit: Payer: Self-pay | Admitting: Internal Medicine

## 2018-01-02 ENCOUNTER — Telehealth: Payer: Self-pay | Admitting: Internal Medicine

## 2018-01-02 ENCOUNTER — Ambulatory Visit (INDEPENDENT_AMBULATORY_CARE_PROVIDER_SITE_OTHER): Payer: Medicare Other | Admitting: Internal Medicine

## 2018-01-02 ENCOUNTER — Encounter: Payer: Self-pay | Admitting: Internal Medicine

## 2018-01-02 VITALS — BP 132/88 | HR 50 | Temp 97.5°F | Ht 69.5 in | Wt 231.0 lb

## 2018-01-02 DIAGNOSIS — N401 Enlarged prostate with lower urinary tract symptoms: Secondary | ICD-10-CM

## 2018-01-02 DIAGNOSIS — I4892 Unspecified atrial flutter: Secondary | ICD-10-CM

## 2018-01-02 DIAGNOSIS — N138 Other obstructive and reflux uropathy: Secondary | ICD-10-CM | POA: Diagnosis not present

## 2018-01-02 DIAGNOSIS — D696 Thrombocytopenia, unspecified: Secondary | ICD-10-CM

## 2018-01-02 DIAGNOSIS — I700 Atherosclerosis of aorta: Secondary | ICD-10-CM | POA: Diagnosis not present

## 2018-01-02 DIAGNOSIS — E1140 Type 2 diabetes mellitus with diabetic neuropathy, unspecified: Secondary | ICD-10-CM

## 2018-01-02 DIAGNOSIS — Z0000 Encounter for general adult medical examination without abnormal findings: Secondary | ICD-10-CM | POA: Diagnosis not present

## 2018-01-02 DIAGNOSIS — Z7189 Other specified counseling: Secondary | ICD-10-CM

## 2018-01-02 LAB — COMPREHENSIVE METABOLIC PANEL
ALT: 23 U/L (ref 0–53)
AST: 21 U/L (ref 0–37)
Albumin: 4 g/dL (ref 3.5–5.2)
Alkaline Phosphatase: 73 U/L (ref 39–117)
BUN: 25 mg/dL — ABNORMAL HIGH (ref 6–23)
CO2: 31 mEq/L (ref 19–32)
Calcium: 10.6 mg/dL — ABNORMAL HIGH (ref 8.4–10.5)
Chloride: 104 mEq/L (ref 96–112)
Creatinine, Ser: 0.86 mg/dL (ref 0.40–1.50)
GFR: 89.8 mL/min (ref >60.00)
Glucose, Bld: 134 mg/dL — ABNORMAL HIGH (ref 70–99)
Potassium: 3.6 mEq/L (ref 3.5–5.1)
Sodium: 140 mEq/L (ref 135–145)
Total Bilirubin: 0.7 mg/dL (ref 0.2–1.2)
Total Protein: 6.5 g/dL (ref 6.0–8.3)

## 2018-01-02 LAB — CBC
HCT: 46.9 % (ref 39.0–52.0)
Hemoglobin: 15.8 g/dL (ref 13.0–17.0)
MCHC: 33.8 g/dL (ref 30.0–36.0)
MCV: 92.9 fl (ref 78.0–100.0)
Platelets: 116 10*3/uL — ABNORMAL LOW (ref 150.0–400.0)
RBC: 5.05 Mil/uL (ref 4.22–5.81)
RDW: 13.4 % (ref 11.5–15.5)
WBC: 5.5 10*3/uL (ref 4.0–10.5)

## 2018-01-02 LAB — LIPID PANEL
Cholesterol: 110 mg/dL (ref 0–200)
HDL: 65.3 mg/dL (ref >39.00)
LDL Cholesterol: 36 mg/dL (ref 0–99)
NonHDL: 44.67
Total CHOL/HDL Ratio: 2
Triglycerides: 43 mg/dL (ref 0.0–149.0)
VLDL: 8.6 mg/dL (ref 0.0–40.0)

## 2018-01-02 LAB — HEMOGLOBIN A1C: Hgb A1c MFr Bld: 6.9 % — ABNORMAL HIGH (ref 4.6–6.5)

## 2018-01-02 LAB — HM DIABETES FOOT EXAM

## 2018-01-02 NOTE — Assessment & Plan Note (Signed)
Seems to have good control Will check labs No sig pain

## 2018-01-02 NOTE — Assessment & Plan Note (Signed)
Monitored by cardiology On warfarin

## 2018-01-02 NOTE — Assessment & Plan Note (Signed)
I have personally reviewed the Medicare Annual Wellness questionnaire and have noted 1. The patient's medical and social history 2. Their use of alcohol, tobacco or illicit drugs 3. Their current medications and supplements 4. The patient's functional ability including ADL's, fall risks, home safety risks and hearing or visual             impairment. 5. Diet and physical activities 6. Evidence for depression or mood disorders  The patients weight, height, BMI and visual acuity have been recorded in the chart I have made referrals, counseling and provided education to the patient based review of the above and I have provided the pt with a written personalized care plan for preventive services.  I have provided you with a copy of your personalized plan for preventive services. Please take the time to review along with your updated medication list.  Yearly flu vaccine Trying to exercise Consider shingrix

## 2018-01-02 NOTE — Assessment & Plan Note (Signed)
No claudication On statin

## 2018-01-02 NOTE — Progress Notes (Signed)
Subjective:    Patient ID: Michael Colon, male    DOB: Aug 05, 1930, 84 y.o.   MRN: [id number]  HPI Here for Medicare wellness visit and follow up of chronic health conditions Reviewed form and advanced directives Reviewed other doctors No alcohol or tobacco Tries to exercise regularly Has house cleaner. They eat out mostly (wife is limited) No falls--but is very cautious. Has to avoid turning and going No depression or anhedonia Vision is okay Hearing is not great----no change Memory seems fine  Checks sugars every 7-10 days 90-110 No hypoglycemic reactions Keeps up with podiatrist--having some trouble with orthotics Chronic tingling  No heart symptoms No palpitations---still on coumadin for a flutter No chest pain or SOB He does feel dizzy at times---better than 2 months ago. Notices it when getting up (if he gets up too fast---will fall back into chair towards the right at times) Gets sense that bed is tilted when it happens there No vertigo. Chronic tinnitus Chronic edema is stable. Wears light support hose  Known aortic atherosclerosis Continues on the atorvastatin No claudication  Known mild thrombocytopenia Bruises easily with the warfarin No abnormal bleeding  Gets sores on legs --due to "poor circulation" ?venous  Current Outpatient Medications on File Prior to Visit  Medication Sig Dispense Refill  . atorvastatin (LIPITOR) 80 MG tablet TAKE 1 TABLET BY MOUTH ONCE DAILY 90 tablet 3  . docusate sodium (COLACE) 100 MG capsule Take 100 mg by mouth 2 (two) times daily.     . famotidine (PEPCID) 20 MG tablet Take 20 mg by mouth daily.     . finasteride (PROSCAR) 5 MG tablet TAKE 1 TABLET BY MOUTH ONCE DAILY 90 tablet 3  . GLIPIZIDE XL 5 MG 24 hr tablet TAKE 1 TABLET BY MOUTH ONCE DAILY WITH BREAKFAST 90 tablet 3  . Glucosamine Sulfate-MSM (GLUCOSAMINE-MSM DS) 500-500 MG TABS Take 1,000 mg by mouth daily.     Marland Kitchen glucose blood (BAYER CONTOUR TEST) test strip USE  STRIP TO TEST BLOOD SUGAR ONCE DAILY. Dx Code: E11.40 100 each 3  . ibuprofen (ADVIL,MOTRIN) 200 MG tablet Take 400 mg by mouth 2 (two) times daily.     Marland Kitchen loratadine (CLARITIN) 10 MG tablet Take 10 mg by mouth daily.      . metFORMIN (GLUCOPHAGE) 500 MG tablet TAKE ONE TABLET BY MOUTH TWICE DAILY WITH MEALS 180 tablet 3  . MICROLET LANCETS MISC Use to test twice daily or as directed (Patient taking differently: Use to test twice weekly or as directed) 100 each 3  . tamsulosin (FLOMAX) 0.4 MG CAPS capsule TAKE 1 CAPSULE BY MOUTH ONCE DAILY 90 capsule 3  . triamterene-hydrochlorothiazide (DYAZIDE) 37.5-25 MG capsule TAKE 1 CAPSULE BY MOUTH IN THE MORNING 90 capsule 3  . warfarin (COUMADIN) 5 MG tablet TAKE 1 TO 1 & 1/2 (ONE TO ONE & ONE-HALF) TABLETS BY MOUTH ONCE DAILY AS DIRECTED 135 tablet 1   No current facility-administered medications on file prior to visit.     Allergies  Allergen Reactions  . Adhesive [Tape] Rash  . Enoxaparin Sodium Hives  . Latex Rash    Has trouble with BANDAIDS that have been left on for more than 24 hours. Prefers paper tape.  . Nickel Rash  . Percocet [Oxycodone-Acetaminophen] Rash    Past Medical History:  Diagnosis Date  . Asthma   . Atrial flutter ([place]) 2007  . Band keratopathy   . Benign prostatic hypertrophy   . Chronic ear infection   .  Chronic kidney disease   . Chronic prostatitis   . Dental bridge present    permanent - upper  . Diabetes mellitus   . Elbow stiffness, left    s/p fracture many yrs ago.  arm does not straighten  . GERD (gastroesophageal reflux disease)    laryngeal involvement  . Hyperlipidemia   . Hypertension   . Obstructive sleep apnea    CPAP-9  . Osteoarthrosis, localized, primary, knee    post-traumatic  . Prostatitis   . Stroke [facility])    TIA  . Tachy-brady syndrome ([place])   . UTI (urinary tract infection)     Past Surgical History:  Procedure Laterality Date  . APPENDECTOMY    . BELPHAROPTOSIS REPAIR      Dr [name]---didn't resolve weepy eye and eyelid drooping  . CARDIOVERSION  04/13/2010  . CATARACT EXTRACTION, BILATERAL  2009  . Chest pain  8/12   Stress test benign  . ESOPHAGEAL DILATION  05/26/2016   Procedure: ESOPHAGEAL DILATION;  Surgeon: Lucilla Lame, MD;  Location: [facility];  Service: Endoscopy;;  . ESOPHAGOGASTRODUODENOSCOPY (EGD) WITH PROPOFOL N/A 05/26/2016   Procedure: ESOPHAGOGASTRODUODENOSCOPY (EGD) WITH PROPOFOL;  Surgeon: Lucilla Lame, MD;  Location: [facility];  Service: Endoscopy;  Laterality: N/A;  Diabetic - oral meds sleep apnea  . EYE SURGERY    . FRACTURE SURGERY Left    elbow  . KNEE SURGERY  1998   plate after fracture, then removed for infection  . left elbow surgery    . MASTOIDECTOMY  8/08   Dr Idelle Crouch  . RHINOPLASTY  5/10   and septoplasty  . SUBACROMIAL DECOMPRESSION Right 2005   Arthroscopic (for rotator cuff and biceps tendon ruptures)  . TEAR DUCT PROBING  11/13   Dr Vickki Muff  . TOTAL KNEE ARTHROPLASTY Left 09/01/2014   Procedure: TOTAL KNEE ARTHROPLASTY;  Surgeon: Dereck Leep, MD;  Location: [facility];  Service: Orthopedics;  Laterality: Left;  . TRIGGER FINGER RELEASE Left 02/25/2015   Procedure: LEFT LONG TRIGGER RELEASE;  Surgeon: Dereck Leep, MD;  Location: [facility];  Service: Orthopedics;  Laterality: Left;  Marland Kitchen VENOUS ABLATION      Family History  Problem Relation Age of Onset  . Colon cancer Father   . Diabetes Father   . Hypertension Father   . Other Mother        natural causes  . Heart attack Neg Hx   . Stroke Neg Hx     Social History   Socioeconomic History  . Marital status: Married    Spouse name: Not on file  . Number of children: 2  . Years of education: Not on file  . Highest education level: Not on file  Occupational History  . Occupation: Theme park manager    Comment: Retired--[employer]  Social Needs  . Financial resource strain: Not on file  . Food insecurity:    Worry: Not on file    Inability: Not  on file  . Transportation needs:    Medical: Not on file    Non-medical: Not on file  Tobacco Use  . Smoking status: Former Smoker    Packs/day: 1.00    Years: 0.00    Pack years: 0.00    Types: Cigarettes    Last attempt to quit: 01/03/1969    Years since quitting: 49.0  . Smokeless tobacco: Never Used  Substance and Sexual Activity  . Alcohol use: No    Alcohol/week: 1.0 standard drinks    Types: 1  Glasses of wine per week    Comment: rare wine. 1-2x/yr.  . Drug use: No  . Sexual activity: Not on file  Lifestyle  . Physical activity:    Days per week: Not on file    Minutes per session: Not on file  . Stress: Not on file  Relationships  . Social connections:    Talks on phone: Not on file    Gets together: Not on file    Attends religious service: Not on file    Active member of club or organization: Not on file    Attends meetings of clubs or organizations: Not on file    Relationship status: Not on file  . Intimate partner violence:    Fear of current or ex partner: Not on file    Emotionally abused: Not on file    Physically abused: Not on file    Forced sexual activity: Not on file  Other Topics Concern  . Not on file  Social History Narrative   Has living will   Health care POA-- wife and then son Michael Colon   Has DNR order from the past---form redone 06/25/10   Probably no feeding tube if cognitively unaware   Review of Systems Has some pain in back when walking--wonders about the orthotics Some knee pain as well---even where he had TKR (but right is worse) Reluctant to have another surgery--he has to help his wife too much Had SCC removed from his left forearm Appetite is good Weight is fairly stable Sleeps okay Very sensitive skin--no other skin problems Wears seat belt---avoids driving at night Teeth are okay. Sees dentist Bowels are fine--no blood Voids reasonably---post void dribbling No heartburn. Some dysphagia--saw ENT and had normal swallowing  study    Objective:   Physical Exam  Constitutional: He is oriented to person, place, and time. He appears well-developed. No distress.  HENT:  Mouth/Throat: Oropharynx is clear and moist. No oropharyngeal exudate.  Neck: No thyromegaly present.  Cardiovascular: Normal rate, regular rhythm and normal heart sounds. Exam reveals no gallop.  No murmur heard. Faint pedal pulses  Respiratory: Effort normal and breath sounds normal. No respiratory distress. He has no wheezes. He has no rales.  GI: Soft. There is no abdominal tenderness.  Musculoskeletal:     Comments: 1+ calf and ankle edema  Lymphadenopathy:    He has no cervical adenopathy.  Neurological: He is alert and oriented to person, place, and time.  President--- "Milinda Pointer, Obama, ?" [phone] [id number] Recall 3/3  Decreased sensation in feet  Skin: No rash noted.  No foot lesions  Psychiatric: He has a normal mood and affect. His behavior is normal.           Assessment & Plan:

## 2018-01-02 NOTE — Progress Notes (Signed)
Hearing Screening   Method: Audiometry   125Hz  250Hz  500Hz  1000Hz  2000Hz  3000Hz  4000Hz  6000Hz  8000Hz   Right ear:   40 40 40  40    Left ear:   40 40 40  40    Vision Screening Comments: August 04, 2017

## 2018-01-02 NOTE — Telephone Encounter (Signed)
Appt in February 2020 for full exam. Due to the eye issue and surgery he has been having a DM Exam. It was not feasible.

## 2018-01-02 NOTE — Telephone Encounter (Signed)
Michael Colon called office to speak with someone about the fax they received for a diabetic eye exam for the pt. She wanted to explain why the pt hasn't had a exam and who faxed over the request. Best cb [phone]

## 2018-01-02 NOTE — Assessment & Plan Note (Signed)
Ongoing symptoms despite the tamsulosin No changes

## 2018-01-02 NOTE — Assessment & Plan Note (Signed)
Has DNR 

## 2018-01-02 NOTE — Assessment & Plan Note (Signed)
Mild  No abnormal bleeding Will recheck 

## 2018-01-08 DIAGNOSIS — B351 Tinea unguium: Secondary | ICD-10-CM | POA: Diagnosis not present

## 2018-01-08 DIAGNOSIS — E1142 Type 2 diabetes mellitus with diabetic polyneuropathy: Secondary | ICD-10-CM | POA: Diagnosis not present

## 2018-01-08 DIAGNOSIS — L851 Acquired keratosis [keratoderma] palmaris et plantaris: Secondary | ICD-10-CM | POA: Diagnosis not present

## 2018-01-19 ENCOUNTER — Ambulatory Visit: Payer: Medicare Other | Admitting: Cardiovascular Disease

## 2018-01-22 ENCOUNTER — Ambulatory Visit (INDEPENDENT_AMBULATORY_CARE_PROVIDER_SITE_OTHER): Payer: Medicare Other

## 2018-01-22 DIAGNOSIS — I4892 Unspecified atrial flutter: Secondary | ICD-10-CM | POA: Diagnosis not present

## 2018-01-22 DIAGNOSIS — Z5181 Encounter for therapeutic drug level monitoring: Secondary | ICD-10-CM | POA: Diagnosis not present

## 2018-01-22 LAB — POCT INR: INR: 3.7 — AB (ref 2.0–3.0)

## 2018-01-22 NOTE — Patient Instructions (Signed)
Please skip coumadin tonight, take only 1 tablet tomorrow, then continue taking 1.5 tablets daily except 1 tablet on Mondays, Wednesdays, and Saturdays.   Please be consistent with your green intake - please pick a day or days each week to have your greens and have them every week on that day (your pills are on a schedule, so your greens need to be on one) Recheck in 4 weeks.

## 2018-01-24 DIAGNOSIS — M65312 Trigger thumb, left thumb: Secondary | ICD-10-CM | POA: Diagnosis not present

## 2018-01-24 DIAGNOSIS — M1711 Unilateral primary osteoarthritis, right knee: Secondary | ICD-10-CM | POA: Diagnosis not present

## 2018-01-24 DIAGNOSIS — M25561 Pain in right knee: Secondary | ICD-10-CM | POA: Diagnosis not present

## 2018-01-24 DIAGNOSIS — M25542 Pain in joints of left hand: Secondary | ICD-10-CM | POA: Diagnosis not present

## 2018-01-25 ENCOUNTER — Ambulatory Visit: Payer: Medicare Other | Admitting: Physician Assistant

## 2018-01-29 ENCOUNTER — Other Ambulatory Visit: Payer: Self-pay | Admitting: Internal Medicine

## 2018-02-02 ENCOUNTER — Other Ambulatory Visit: Payer: Self-pay | Admitting: Internal Medicine

## 2018-02-19 ENCOUNTER — Ambulatory Visit (INDEPENDENT_AMBULATORY_CARE_PROVIDER_SITE_OTHER): Payer: Medicare Other

## 2018-02-19 DIAGNOSIS — Z5181 Encounter for therapeutic drug level monitoring: Secondary | ICD-10-CM | POA: Diagnosis not present

## 2018-02-19 DIAGNOSIS — I4892 Unspecified atrial flutter: Secondary | ICD-10-CM

## 2018-02-19 LAB — POCT INR: INR: 2.7 (ref 2.0–3.0)

## 2018-02-19 NOTE — Patient Instructions (Signed)
Please continue taking 1.5 tablets daily except 1 tablet on Mondays, Wednesdays, and Saturdays.   Please be consistent with your green intake - please pick a day or days each week to have your greens and have them every week on that day (your pills are on a schedule, so your greens need to be on one) Recheck in 5 weeks.

## 2018-02-22 DIAGNOSIS — E119 Type 2 diabetes mellitus without complications: Secondary | ICD-10-CM | POA: Diagnosis not present

## 2018-02-22 LAB — HM DIABETES EYE EXAM

## 2018-02-23 ENCOUNTER — Encounter: Payer: Self-pay | Admitting: Internal Medicine

## 2018-03-01 NOTE — Progress Notes (Incomplete)
Cardiology Office Note  Date:  03/01/2018   ID:  Michael Colon, DOB 1930-01-10, MRN [id number]  PCP:  Venia Carbon, MD   No chief complaint on file.   HPI:  Michael Colon is a very pleasant 85 y.o. gentleman with history of  CT calcium score 1300,  atrial flutter (3/14), previous ablation , on warfarin,  hypertension,  borderline diabetes,  Obstructive sleep apnea  [facility] on August 04, 2010 with chest pain.  Symptoms started after eating and it was felt he had GI spasm or hiatal hernia. Mild improvement in symptoms with nitroglycerin and GI cocktail in the emergency room. He is very active at baseline.  He reports having a TIA some time in 2014, workup negative at [facility]  Previous vein ablation surgery at [facility] Underlying diabetes type 2 He presents today for follow-up of his atrial flutter, hyperlipidemia  INTERVAL HISTORY: The patient reports today for follow up.  ***  Blood pressure ***/*** Total Chol 110/ LDL 36 HBA1C 6.9 CR 0.86 Glucose 134   EKG personally reviewed by myself on todays visit Shows *** rhythm. *** bpm. ***  {Recently evaluated by Dr. Lucky Cowboy Had a procedure in the hospital, right great saphenous vein and small saphenous vein laser ablation. His postprocedural duplex showed successful ablation without DVT.  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 Pacemaker was not recommended at that time. Previously seen by Dr. Caryl Comes, EP Previous Holter monitor showed normal sinus rhythm with pauses up to 2.86 seconds, bradycardia with heart rates in the 20s to 30s at nighttime, 30s to 50 during the daytime.  Hemoglobin A1c in December 2014 7.5, total cholesterol up from 132 now 200   PMH:   has a past medical history of Asthma, Atrial flutter ([place]) (2007), Band keratopathy, Benign prostatic hypertrophy, Chronic ear infection, Chronic kidney disease, Chronic prostatitis, Dental bridge present, Diabetes mellitus, Elbow stiffness, left, GERD (gastroesophageal reflux disease), Hyperlipidemia, Hypertension, Obstructive sleep apnea, Osteoarthrosis, localized, primary, knee, Prostatitis, Stroke ([place]), Tachy-brady syndrome ([place]), and UTI (urinary tract infection).  PSH:    Past Surgical History:  Procedure Laterality Date   APPENDECTOMY     BELPHAROPTOSIS REPAIR     Dr [name]---didn't resolve weepy eye and eyelid drooping   CARDIOVERSION  04/13/2010   CATARACT EXTRACTION, BILATERAL  2009   Chest pain  8/12   Stress test benign   ESOPHAGEAL DILATION  05/26/2016   Procedure: ESOPHAGEAL DILATION;  Surgeon: Lucilla Lame, MD;  Location: [place];  Service: Endoscopy;;   ESOPHAGOGASTRODUODENOSCOPY (EGD) WITH PROPOFOL N/A 05/26/2016   Procedure: ESOPHAGOGASTRODUODENOSCOPY (EGD) WITH PROPOFOL;  Surgeon: Lucilla Lame, MD;  Location: [place];  Service: Endoscopy;  Laterality: N/A;  Diabetic - oral meds sleep apnea   EYE  SURGERY     FRACTURE SURGERY Left    elbow   KNEE SURGERY  1998   plate after fracture, then removed for infection   left elbow surgery     MASTOIDECTOMY  8/08   Dr Idelle Crouch   RHINOPLASTY  5/10   and septoplasty   SUBACROMIAL DECOMPRESSION Right 2005    Arthroscopic (for rotator cuff and biceps tendon ruptures)   TEAR DUCT PROBING  11/13   Dr Vickki Muff   TOTAL KNEE ARTHROPLASTY Left 09/01/2014   Procedure: TOTAL KNEE ARTHROPLASTY;  Surgeon: Dereck Leep, MD;  Location: [facility];  Service: Orthopedics;  Laterality: Left;   TRIGGER FINGER RELEASE Left 02/25/2015   Procedure: LEFT LONG TRIGGER RELEASE;  Surgeon: Dereck Leep, MD;  Location: [facility];  Service: Orthopedics;  Laterality: Left;   VENOUS ABLATION      Current Outpatient Medications  Medication Sig Dispense Refill   atorvastatin (LIPITOR) 80 MG tablet TAKE 1 TABLET BY MOUTH ONCE DAILY 90 tablet 3   docusate sodium (COLACE) 100 MG capsule Take 100 mg by mouth 2 (two) times daily.      famotidine (PEPCID) 20 MG tablet Take 20 mg by mouth daily.      finasteride (PROSCAR) 5 MG tablet TAKE 1 TABLET BY MOUTH ONCE DAILY 90 tablet 3   GLIPIZIDE XL 5 MG 24 hr tablet TAKE 1 TABLET BY MOUTH ONCE DAILY WITH BREAKFAST 90 tablet 3   Glucosamine Sulfate-MSM (GLUCOSAMINE-MSM DS) 500-500 MG TABS Take 1,000 mg by mouth daily.      glucose blood (BAYER CONTOUR TEST) test strip USE STRIP TO TEST BLOOD SUGAR ONCE DAILY. Dx Code: E11.40 100 each 3   ibuprofen (ADVIL,MOTRIN) 200 MG tablet Take 400 mg by mouth 2 (two) times daily.      loratadine (CLARITIN) 10 MG tablet Take 10 mg by mouth daily.       metFORMIN (GLUCOPHAGE) 500 MG tablet TAKE 1 TABLET BY MOUTH TWICE DAILY WITH MEALS 180 tablet 3   MICROLET LANCETS MISC Use to test twice daily or as directed (Patient taking differently: Use to test twice weekly or as directed) 100 each 3   tamsulosin (FLOMAX) 0.4 MG CAPS capsule TAKE 1 CAPSULE BY MOUTH ONCE DAILY 90 capsule 3   triamterene-hydrochlorothiazide (DYAZIDE) 37.5-25 MG capsule TAKE 1 CAPSULE BY MOUTH IN THE MORNING 90 capsule 3   warfarin (COUMADIN) 5 MG tablet TAKE 1 TO 1 & 1/2 (ONE TO ONE & ONE-HALF) TABLETS BY MOUTH ONCE DAILY AS DIRECTED 135 tablet 1   No current  facility-administered medications for this visit.      Allergies:   Adhesive [tape]; Enoxaparin sodium; Latex; Nickel; and Percocet [oxycodone-acetaminophen]   Social History:  The patient  reports that he quit smoking about 49 years ago. His smoking use included cigarettes. He smoked 1.00 pack per day for 0.00 years. He has never used smokeless tobacco. He reports that he does not drink alcohol or use drugs.   Family History:   family history includes Colon cancer in his father; Diabetes in his father; Hypertension in his father; Other in his mother.    Review of Systems: Review of Systems  Constitutional: Negative.   Eyes: Negative.   Respiratory: Negative.   Cardiovascular: Negative.   Gastrointestinal: Negative.   Genitourinary: Negative.   Musculoskeletal: Negative.   Neurological: Negative.   Psychiatric/Behavioral: Negative.   All other systems reviewed and are negative.    PHYSICAL EXAM: VS:  There were no vitals taken  for this visit. , BMI There is no height or weight on file to calculate BMI.  Constitutional:  oriented to person, place, and time. No distress.  HENT:  Head: Grossly normal Eyes:  no discharge. No scleral icterus.  Neck: No JVD, no carotid bruits  Cardiovascular: Regular rate and rhythm, no murmurs appreciated *** Pulmonary/Chest: Clear to auscultation bilaterally, no wheezes or rails Abdominal: Soft.  no distension.  no tenderness.  Musculoskeletal: Normal range of motion Neurological:  normal muscle tone. Coordination normal. No atrophy Skin: Skin warm and dry Psychiatric: normal affect, pleasant   Recent Labs: 01/02/2018: ALT 23; BUN 25; Creatinine, Ser 0.86; Hemoglobin 15.8; Platelets 116.0; Potassium 3.6; Sodium 140    Lipid Panel Lab Results  Component Value Date   CHOL 110 01/02/2018   HDL 65.30 01/02/2018   LDLCALC 36 01/02/2018   TRIG 43.0 01/02/2018      Wt Readings from Last 3 Encounters:  01/02/18 231 lb (104.8 kg)   07/20/17 232 lb (105.2 kg)  12/28/16 239 lb 12 oz (108.7 kg)       ASSESSMENT AND PLAN:  Atrial flutter, unspecified type ([place]) Plan: EKG 12-Lead Atrial fibrillation on today's visit, chronic, on anticoagulation, asymptomatic  Coronary artery disease involving native coronary artery of native heart without angina pectoris Plan: CT coronary calcium score of 1300 negative stress test indicating no severe stenoses He denies any symptoms concerning for angina aggressive management of his cholesterol and diabetes  Hyperlipidemia Plan: Cholesterol is at goal on the current lipid regimen. No changes to the medications were made.  Essential hypertension Plan:  Blood pressure is well controlled on today's visit. No changes made to the medications.  Type 2 diabetes, controlled, with neuropathy ([place]) Plan:  Recommended exercise program, dietary restrictions efforts to lose weight.   Total encounter time more than *** minutes  Greater than 50% was spent in counseling and coordination of care with the patient  Disposition:   F/U  *** months   No orders of the defined types were placed in this encounter.  I, Jesus Reyes am acting as a Education administrator for Ida Rogue, M.D., Ph.D.  {Add scribe attestation statement}  Signed, Esmond Plants, M.D., Ph.D. 03/01/2018  [place], [place]

## 2018-03-05 ENCOUNTER — Ambulatory Visit: Payer: Medicare Other | Admitting: Cardiovascular Disease

## 2018-03-14 DIAGNOSIS — L851 Acquired keratosis [keratoderma] palmaris et plantaris: Secondary | ICD-10-CM | POA: Diagnosis not present

## 2018-03-14 DIAGNOSIS — E1142 Type 2 diabetes mellitus with diabetic polyneuropathy: Secondary | ICD-10-CM | POA: Diagnosis not present

## 2018-03-14 DIAGNOSIS — B351 Tinea unguium: Secondary | ICD-10-CM | POA: Diagnosis not present

## 2018-03-26 ENCOUNTER — Other Ambulatory Visit: Payer: Self-pay

## 2018-03-26 ENCOUNTER — Other Ambulatory Visit: Payer: Self-pay | Admitting: Internal Medicine

## 2018-03-26 ENCOUNTER — Ambulatory Visit (INDEPENDENT_AMBULATORY_CARE_PROVIDER_SITE_OTHER): Payer: Medicare Other

## 2018-03-26 DIAGNOSIS — Z5181 Encounter for therapeutic drug level monitoring: Secondary | ICD-10-CM

## 2018-03-26 DIAGNOSIS — I4892 Unspecified atrial flutter: Secondary | ICD-10-CM | POA: Diagnosis not present

## 2018-03-26 LAB — POCT INR: INR: 3 (ref 2.0–3.0)

## 2018-03-26 NOTE — Patient Instructions (Signed)
Since you are unable to have greens today, take 1/2 tablet tonight, then continue taking 1.5 tablets daily except 1 tablet on Mondays, Wednesdays, and Saturdays.   Please be consistent with your green intake - please pick a day or days each week to have your greens and have them every week on that day (your pills are on a schedule, so your greens need to be on one) Recheck in 7 weeks.

## 2018-04-02 ENCOUNTER — Telehealth: Payer: Self-pay

## 2018-04-02 NOTE — Telephone Encounter (Signed)
Virtual Visit Pre-Appointment Phone Call  Steps For Call:  1. Confirm consent - "In the setting of the current Covid19 crisis, you are scheduled for a phone visit with your provider on 04/11/2018 at 2:40PM.  Just as we do with many in-office visits, in order for you to participate in this visit, we must obtain consent.  If you'd like, I can send this to your mychart (if signed up) or email for you to review.  Otherwise, I can obtain your verbal consent now.  All virtual visits are billed to your insurance company just like a normal visit would be.  By agreeing to a virtual visit, we'd like you to understand that the technology does not allow for your provider to perform an examination, and thus may limit your provider's ability to fully assess your condition.  Finally, though the technology is pretty good, we cannot assure that it will always work on either your or our end, and in the setting of a video visit, we may have to convert it to a phone-only visit.  In either situation, we cannot ensure that we have a secure connection.  Are you willing to proceed?"  2. Give patient instructions for WebEx download to smartphone as below if video visit  3. Advise patient to be prepared with any vital sign or heart rhythm information, their current medicines, and a piece of paper and pen handy for any instructions they may receive the day of their visit  4. Inform patient they will receive a phone call 15 minutes prior to their appointment time (may be from unknown caller ID) so they should be prepared to answer  5. Confirm that appointment type is correct in Epic appointment notes (video vs telephone)    TELEPHONE CALL NOTE  Michael Colon has been deemed a candidate for a follow-up tele-health visit to limit community exposure during the Covid-19 pandemic. I spoke with the patient via phone to ensure availability of phone/video source, confirm preferred email & phone number, and discuss instructions  and expectations.  I reminded Michael Colon to be prepared with any vital sign and/or heart rhythm information that could potentially be obtained via home monitoring, at the time of his visit. I reminded Michael Colon to expect a phone call at the time of his visit if his visit.  Did the patient verbally acknowledge consent to treatment?   YES  Janan Ridge, [place] 04/02/2018 1:15 PM   CONSENT FOR TELE-HEALTH VISIT - PLEASE REVIEW  I hereby voluntarily request, consent and authorize [facility] and its employed or contracted physicians, physician assistants, nurse practitioners or other licensed health care professionals (the Practitioner), to provide me with telemedicine health care services (the Services") as deemed necessary by the treating Practitioner. I acknowledge and consent to receive the Services by the Practitioner via telemedicine. I understand that the telemedicine visit will involve communicating with the Practitioner through live audiovisual communication technology and the disclosure of certain medical information by electronic transmission. I acknowledge that I have been given the opportunity to request an in-person assessment or other available alternative prior to the telemedicine visit and am voluntarily participating in the telemedicine visit.  I understand that I have the right to withhold or withdraw my consent to the use of telemedicine in the course of my care at any time, without affecting my right to future care or treatment, and that the Practitioner or I may terminate the telemedicine visit at any time. I understand  that I have the right to inspect all information obtained and/or recorded in the course of the telemedicine visit and may receive copies of available information for a reasonable fee.  I understand that some of the potential risks of receiving the Services via telemedicine include:   Delay or interruption in medical evaluation due to technological  equipment failure or disruption;  Information transmitted may not be sufficient (e.g. poor resolution of images) to allow for appropriate medical decision making by the Practitioner; and/or   In rare instances, security protocols could fail, causing a breach of personal health information.  Furthermore, I acknowledge that it is my responsibility to provide information about my medical history, conditions and care that is complete and accurate to the best of my ability. I acknowledge that Practitioner's advice, recommendations, and/or decision may be based on factors not within their control, such as incomplete or inaccurate data provided by me or distortions of diagnostic images or specimens that may result from electronic transmissions. I understand that the practice of medicine is not an exact science and that Practitioner makes no warranties or guarantees regarding treatment outcomes. I acknowledge that I will receive a copy of this consent concurrently upon execution via email to the email address I last provided but may also request a printed copy by calling the office of [place].    I understand that my insurance will be billed for this visit.   I have read or had this consent read to me.  I understand the contents of this consent, which adequately explains the benefits and risks of the Services being provided via telemedicine.   I have been provided ample opportunity to ask questions regarding this consent and the Services and have had my questions answered to my satisfaction.  I give my informed consent for the services to be provided through the use of telemedicine in my medical care  By participating in this telemedicine visit I agree to the above.

## 2018-04-04 ENCOUNTER — Telehealth: Payer: Self-pay | Admitting: Cardiovascular Disease

## 2018-04-04 NOTE — Telephone Encounter (Signed)
New Message    Pt is calling because he says he has a computer to do the e-visit   Please call back

## 2018-04-05 NOTE — Telephone Encounter (Signed)
Made a video visit for 04/11/2018 with patient.  He was able to get a computer with a video camera.

## 2018-04-09 DIAGNOSIS — M1711 Unilateral primary osteoarthritis, right knee: Secondary | ICD-10-CM | POA: Diagnosis not present

## 2018-04-11 ENCOUNTER — Other Ambulatory Visit: Payer: Self-pay

## 2018-04-11 ENCOUNTER — Telehealth (INDEPENDENT_AMBULATORY_CARE_PROVIDER_SITE_OTHER): Payer: Medicare Other | Admitting: Cardiovascular Disease

## 2018-04-11 DIAGNOSIS — I4892 Unspecified atrial flutter: Secondary | ICD-10-CM

## 2018-04-11 DIAGNOSIS — G4733 Obstructive sleep apnea (adult) (pediatric): Secondary | ICD-10-CM

## 2018-04-11 DIAGNOSIS — I700 Atherosclerosis of aorta: Secondary | ICD-10-CM

## 2018-04-11 DIAGNOSIS — I10 Essential (primary) hypertension: Secondary | ICD-10-CM | POA: Diagnosis not present

## 2018-04-11 DIAGNOSIS — R42 Dizziness and giddiness: Secondary | ICD-10-CM

## 2018-04-11 DIAGNOSIS — E1140 Type 2 diabetes mellitus with diabetic neuropathy, unspecified: Secondary | ICD-10-CM

## 2018-04-11 DIAGNOSIS — E782 Mixed hyperlipidemia: Secondary | ICD-10-CM | POA: Diagnosis not present

## 2018-04-11 DIAGNOSIS — I495 Sick sinus syndrome: Secondary | ICD-10-CM

## 2018-04-11 NOTE — Patient Instructions (Addendum)
No changes  Medication Instructions:  No changes  If you need a refill on your cardiac medications before your next appointment, please call your pharmacy.    Lab work: No new labs needed   If you have labs (blood work) drawn today and your tests are completely normal, you will receive your results only by: Marland Kitchen MyChart Message (if you have MyChart) OR . A paper copy in the mail If you have any lab test that is abnormal or we need to change your treatment, we will call you to review the results.   Testing/Procedures: No new testing needed   Follow-Up: At [facility], you and your health needs are our priority.  As part of our continuing mission to provide you with exceptional heart care, we have created designated Provider Care Teams.  These Care Teams include your primary Cardiologist (physician) and Advanced Practice Providers (APPs -  Physician Assistants and Nurse Practitioners) who all work together to provide you with the care you need, when you need it.  . You will need a follow up appointment in 6 months .   Please call our office 2 months in advance to schedule this appointment.  (call in early August to schedule)  . Providers on your designated Care Team:   . Murray Hodgkins, NP . Christell Faith, PA-C . Marrianne Mood, PA-C  Any Other Special Instructions Will Be Listed Below (If Applicable).  For educational health videos Log in to : [email or web address] Or : [email or web address], password : [email or web address]

## 2018-04-11 NOTE — Progress Notes (Addendum)
Virtual Visit via Video Note   This visit type was conducted due to national recommendations for restrictions regarding the COVID-19 Pandemic (e.g. social distancing) in an effort to limit this patient's exposure and mitigate transmission in our community.  Due to her co-morbid illnesses, this patient is at least at moderate risk for complications without adequate follow up.  This format is felt to be most appropriate for this patient at this time.  All issues noted in this document were discussed and addressed.  A limited physical exam was performed with this format.  Please refer to the patient's chart for her consent to telehealth for [facility].    Date:  04/11/2018   ID:  Michael Colon, DOB Sep 08, 1930, MRN [id number]  Patient Location:  [place] [address]   Provider location:   [facility], [place] office  PCP:  Venia Carbon, MD  Cardiologist:  Patsy Baltimore  Chief Complaint:  Dizzy, leg swelling    History of Present Illness:    Michael Colon is a 85 y.o. male who presents via audio/video conferencing for a telehealth visit today.   The patient does not symptoms concerning for COVID-19 infection (fever, chills, cough, or new SHORTNESS OF BREATH).   Patient has a past medical history of CT calcium score 1300,  atrial flutter (3/14), previous ablation , on warfarin,  hypertension,  borderline diabetes,  Obstructive sleep apnea  [facility] on August 04, 2010 with chest pain.  Symptoms started after eating and it was felt he had GI spasm or hiatal hernia. Mild improvement in symptoms with nitroglycerin and GI cocktail in the emergency room. He is very active at baseline.  He reports having a TIA some time in 2014, workup negative at [facility] Previous vein ablation surgery at [facility] Underlying diabetes type 2 S/p  right great saphenous vein and small saphenous vein laser ablation. His postprocedural duplex showed successful ablation without  DVT. He presents today for follow-up of his atrial flutter, hyperlipidemia   HBA1C 10 down to 6.9 Total chol 110, LDL 36  Having some dizziness/spinning No constant Happens when in bed, Room spinning When bending down, or turning head Did not do well with meclizine, side effects Previously seen by ENT  130/78 pressure Pulse 50s  Continues to have lower extremity edema bilaterally, pitting Not wearing compression hose, wore for one week  Cortisone to knee, doing better  Chronic asymptomatic bradycardia rate in the 50s No orthostasis  No regular exercise program  Weight continues to run high  Other past medical history reviewed Chronic leg swelling worse on the left leg than the right secondary to previous knee surgeries   Prior CV studies:   The following studies were reviewed today:  Previously evaluated for bradycardia. Pacemaker was not recommended at that time. Previously seen by Dr. Caryl Comes, EP  Previous Holter monitor showed normal sinus rhythm with pauses up to 2.86 seconds, bradycardia with heart rates in the 20s to 30s at nighttime, 30s to 50 during the daytime.  Hemoglobin A1c in December 2014 7.5, total cholesterol up from 132 now 200  CT calcium score 1300,  stress test 04/2015 Following the CT scan, no ischemia   Past Medical History:  Diagnosis Date  . Asthma   . Atrial flutter ([place]) 2007  . Band keratopathy   . Benign prostatic hypertrophy   . Chronic ear infection   . Chronic kidney disease   . Chronic prostatitis   . Dental  bridge present    permanent - upper  . Diabetes mellitus   . Elbow stiffness, left    s/p fracture many yrs ago.  arm does not straighten  . GERD (gastroesophageal reflux disease)    laryngeal involvement  . Hyperlipidemia   . Hypertension   . Obstructive sleep apnea    CPAP-9  . Osteoarthrosis, localized, primary, knee    post-traumatic  . Prostatitis   . Stroke [facility])    TIA  . Tachy-brady syndrome ([place])    . UTI (urinary tract infection)    Past Surgical History:  Procedure Laterality Date  . APPENDECTOMY    . BELPHAROPTOSIS REPAIR     Dr [name]---didn't resolve weepy eye and eyelid drooping  . CARDIOVERSION  04/13/2010  . CATARACT EXTRACTION, BILATERAL  2009  . Chest pain  8/12   Stress test benign  . ESOPHAGEAL DILATION  05/26/2016   Procedure: ESOPHAGEAL DILATION;  Surgeon: Lucilla Lame, MD;  Location: [place];  Service: Endoscopy;;  . ESOPHAGOGASTRODUODENOSCOPY (EGD) WITH PROPOFOL N/A 05/26/2016   Procedure: ESOPHAGOGASTRODUODENOSCOPY (EGD) WITH PROPOFOL;  Surgeon: Lucilla Lame, MD;  Location: [place];  Service: Endoscopy;  Laterality: N/A;  Diabetic - oral meds sleep apnea  . EYE SURGERY    . FRACTURE SURGERY Left    elbow  . KNEE SURGERY  1998   plate after fracture, then removed for infection  . left elbow surgery    . MASTOIDECTOMY  8/08   Dr Idelle Crouch  . RHINOPLASTY  5/10   and septoplasty  . SUBACROMIAL DECOMPRESSION Right 2005   Arthroscopic (for rotator cuff and biceps tendon ruptures)  . TEAR DUCT PROBING  11/13   Dr Vickki Muff  . TOTAL KNEE ARTHROPLASTY Left 09/01/2014   Procedure: TOTAL KNEE ARTHROPLASTY;  Surgeon: Dereck Leep, MD;  Location: [facility];  Service: Orthopedics;  Laterality: Left;  . TRIGGER FINGER RELEASE Left 02/25/2015   Procedure: LEFT LONG TRIGGER RELEASE;  Surgeon: Dereck Leep, MD;  Location: [facility];  Service: Orthopedics;  Laterality: Left;  Marland Kitchen VENOUS ABLATION       No outpatient medications have been marked as taking for the 04/11/18 encounter (Appointment) with Minna Merritts, MD.     Allergies:   Adhesive [tape]; Enoxaparin sodium; Latex; Nickel; and Percocet [oxycodone-acetaminophen]   Social History   Tobacco Use  . Smoking status: Former Smoker    Packs/day: 1.00    Years: 0.00    Pack years: 0.00    Types: Cigarettes    Last attempt to quit: 01/03/1969    Years since quitting: 49.3  . Smokeless tobacco:  Never Used  Substance Use Topics  . Alcohol use: No    Alcohol/week: 1.0 standard drinks    Types: 1 Glasses of wine per week    Comment: rare wine. 1-2x/yr.  . Drug use: No     Current Outpatient Medications on File Prior to Visit  Medication Sig Dispense Refill  . atorvastatin (LIPITOR) 80 MG tablet TAKE 1 TABLET BY MOUTH ONCE DAILY 90 tablet 3  . docusate sodium (COLACE) 100 MG capsule Take 100 mg by mouth 2 (two) times daily.     . famotidine (PEPCID) 20 MG tablet Take 20 mg by mouth daily.     . finasteride (PROSCAR) 5 MG tablet TAKE 1 TABLET BY MOUTH ONCE DAILY 90 tablet 3  . glipiZIDE (GLUCOTROL XL) 5 MG 24 hr tablet Take 1 tablet by mouth once daily with breakfast 90 tablet 1  .  Glucosamine Sulfate-MSM (GLUCOSAMINE-MSM DS) 500-500 MG TABS Take 1,000 mg by mouth daily.     Marland Kitchen glucose blood (BAYER CONTOUR TEST) test strip USE STRIP TO TEST BLOOD SUGAR ONCE DAILY. Dx Code: E11.40 100 each 3  . ibuprofen (ADVIL,MOTRIN) 200 MG tablet Take 400 mg by mouth 2 (two) times daily.     Marland Kitchen loratadine (CLARITIN) 10 MG tablet Take 10 mg by mouth daily.      . metFORMIN (GLUCOPHAGE) 500 MG tablet TAKE 1 TABLET BY MOUTH TWICE DAILY WITH MEALS 180 tablet 3  . MICROLET LANCETS MISC Use to test twice daily or as directed (Patient taking differently: Use to test twice weekly or as directed) 100 each 3  . tamsulosin (FLOMAX) 0.4 MG CAPS capsule TAKE 1 CAPSULE BY MOUTH ONCE DAILY 90 capsule 3  . triamterene-hydrochlorothiazide (DYAZIDE) 37.5-25 MG capsule TAKE 1 CAPSULE BY MOUTH IN THE MORNING 90 capsule 3  . warfarin (COUMADIN) 5 MG tablet TAKE 1 TO 1 & 1/2 (ONE TO ONE & ONE-HALF) TABLETS BY MOUTH ONCE DAILY AS DIRECTED 135 tablet 1   No current facility-administered medications on file prior to visit.      Family Hx: The patient's family history includes Colon cancer in his father; Diabetes in his father; Hypertension in his father; Other in his mother. There is no history of Heart attack or Stroke.   ROS:   Please see the history of present illness.    Review of Systems  Constitutional: Negative.   Respiratory: Negative.   Cardiovascular: Negative.   Gastrointestinal: Negative.   Musculoskeletal: Negative.   Neurological: Positive for dizziness.  Psychiatric/Behavioral: Negative.   All other systems reviewed and are negative.     Labs/Other Tests and Data Reviewed:    Recent Labs: 01/02/2018: ALT 23; BUN 25; Creatinine, Ser 0.86; Hemoglobin 15.8; Platelets 116.0; Potassium 3.6; Sodium 140   Recent Lipid Panel Lab Results  Component Value Date/Time   CHOL 110 01/02/2018 09:26 AM   TRIG 43.0 01/02/2018 09:26 AM   HDL 65.30 01/02/2018 09:26 AM   CHOLHDL 2 01/02/2018 09:26 AM   LDLCALC 36 01/02/2018 09:26 AM    Wt Readings from Last 3 Encounters:  01/02/18 231 lb (104.8 kg)  07/20/17 232 lb (105.2 kg)  12/28/16 239 lb 12 oz (108.7 kg)     Exam:    Vital Signs: Vital signs as detailed above in HPI  Well nourished, well developed male in no acute distress. Constitutional:  oriented to person, place, and time. No distress.  Head: Normocephalic and atraumatic.  Eyes:  no discharge. No scleral icterus.  Neck: Normal range of motion. Neck supple.  Pulmonary/Chest: No audible wheezing, no distress, appears comfortable Musculoskeletal: Normal range of motion.  no  tenderness or deformity.  Neurological:   Coordination normal. Full exam not performed Skin:  No rash Psychiatric:  normal mood and affect. behavior is normal. Thought content normal.    ASSESSMENT & PLAN:    Atrial fibrillation/flutter On anticoagulation, asymptomatic Rate well controlled  Tachy-brady syndrome (HCC)  Mixed hyperlipidemia Dramatic improvement in his cholesterol numbers, discussed with him in detail  Essential hypertension Blood pressure is well controlled on today's visit. No changes made to the medications.  Type 2 diabetes, controlled, with neuropathy ([place]) Dramatic improvement  in his diabetes numbers, recommended continued weight loss, diet restriction and walking program  Aortic atherosclerosis (HCC) Numbers now at goal LDL less than 70  Obstructive sleep apnea Recommend he continue weight loss, walking program, lifestyle modification Overall  doing much better past several years  Dizziness Concerning for variant of vertigo Suggested he consider talking with ENT   COVID-19 Education: The signs and symptoms of COVID-19 were discussed with the patient and how to seek care for testing (follow up with PCP or arrange E-visit).  The importance of social distancing was discussed today.  Patient Risk:   After full review of this patients clinical status, I feel that they are at least moderate risk at this time.  Time:   Today, I have spent 25 minutes with the patient with telehealth technology discussing vertigo, atrial fibrillation/flutter, anticoagulation, diabetes and lipid control.     Medication Adjustments/Labs and Tests Ordered: Current medicines are reviewed at length with the patient today.  Concerns regarding medicines are outlined above.   Tests Ordered: No tests ordered   Medication Changes: No changes made   Disposition: Follow-up in 6 months   Signed, Ida Rogue, MD  04/11/2018 2:52 PM    [place] Office [address] [place] [address], [place], [place] [address]

## 2018-04-16 ENCOUNTER — Ambulatory Visit: Payer: Medicare Other | Admitting: Cardiovascular Disease

## 2018-04-20 ENCOUNTER — Other Ambulatory Visit: Payer: Self-pay | Admitting: Cardiovascular Disease

## 2018-04-20 NOTE — Telephone Encounter (Signed)
Refill Request.  

## 2018-04-20 NOTE — Telephone Encounter (Signed)
Please review for refill.  

## 2018-05-11 ENCOUNTER — Telehealth: Payer: Self-pay

## 2018-05-11 NOTE — Telephone Encounter (Signed)

## 2018-05-14 ENCOUNTER — Other Ambulatory Visit: Payer: Self-pay

## 2018-05-14 ENCOUNTER — Ambulatory Visit (INDEPENDENT_AMBULATORY_CARE_PROVIDER_SITE_OTHER): Payer: Medicare Other

## 2018-05-14 DIAGNOSIS — I4892 Unspecified atrial flutter: Secondary | ICD-10-CM

## 2018-05-14 DIAGNOSIS — Z5181 Encounter for therapeutic drug level monitoring: Secondary | ICD-10-CM

## 2018-05-14 LAB — POCT INR: INR: 3.3 — AB (ref 2.0–3.0)

## 2018-05-14 NOTE — Patient Instructions (Signed)
Please skip coumadin tonight, then START NEW DOSAGE of 1 tablets daily except 1.5 tablet on SUNDAYS, [place].   Please be consistent with your green intake - please pick a day or days each week to have your greens and have them every week on that day (your pills are on a schedule, so your greens need to be on one) Recheck in 4 weeks.

## 2018-05-21 DIAGNOSIS — E1142 Type 2 diabetes mellitus with diabetic polyneuropathy: Secondary | ICD-10-CM | POA: Diagnosis not present

## 2018-05-21 DIAGNOSIS — B351 Tinea unguium: Secondary | ICD-10-CM | POA: Diagnosis not present

## 2018-06-04 DIAGNOSIS — L570 Actinic keratosis: Secondary | ICD-10-CM | POA: Diagnosis not present

## 2018-06-04 DIAGNOSIS — R202 Paresthesia of skin: Secondary | ICD-10-CM | POA: Diagnosis not present

## 2018-06-04 DIAGNOSIS — Z85828 Personal history of other malignant neoplasm of skin: Secondary | ICD-10-CM | POA: Diagnosis not present

## 2018-06-04 DIAGNOSIS — X32XXXA Exposure to sunlight, initial encounter: Secondary | ICD-10-CM | POA: Diagnosis not present

## 2018-06-04 DIAGNOSIS — Z08 Encounter for follow-up examination after completed treatment for malignant neoplasm: Secondary | ICD-10-CM | POA: Diagnosis not present

## 2018-06-04 DIAGNOSIS — I872 Venous insufficiency (chronic) (peripheral): Secondary | ICD-10-CM | POA: Diagnosis not present

## 2018-06-08 ENCOUNTER — Telehealth: Payer: Self-pay

## 2018-06-08 NOTE — Telephone Encounter (Signed)

## 2018-06-11 ENCOUNTER — Other Ambulatory Visit: Payer: Self-pay

## 2018-06-11 ENCOUNTER — Ambulatory Visit (INDEPENDENT_AMBULATORY_CARE_PROVIDER_SITE_OTHER): Payer: Medicare Other

## 2018-06-11 DIAGNOSIS — I4892 Unspecified atrial flutter: Secondary | ICD-10-CM

## 2018-06-11 DIAGNOSIS — Z5181 Encounter for therapeutic drug level monitoring: Secondary | ICD-10-CM | POA: Diagnosis not present

## 2018-06-11 LAB — POCT INR: INR: 2.5 (ref 2.0–3.0)

## 2018-06-11 NOTE — Patient Instructions (Signed)
Please continue of 1 tablets daily except 1.5 tablet on SUNDAYS, TUESDAYS & THURSDAYS.   Please be consistent with your green intake - please pick a day or days each week to have your greens and have them every week on that day (your pills are on a schedule, so your greens need to be on one) Recheck in 5 weeks.

## 2018-07-03 ENCOUNTER — Ambulatory Visit: Payer: Medicare Other | Admitting: Internal Medicine

## 2018-07-13 ENCOUNTER — Telehealth: Payer: Self-pay

## 2018-07-13 NOTE — Telephone Encounter (Signed)

## 2018-07-16 ENCOUNTER — Ambulatory Visit (INDEPENDENT_AMBULATORY_CARE_PROVIDER_SITE_OTHER): Payer: Medicare Other

## 2018-07-16 ENCOUNTER — Other Ambulatory Visit: Payer: Self-pay

## 2018-07-16 DIAGNOSIS — Z5181 Encounter for therapeutic drug level monitoring: Secondary | ICD-10-CM

## 2018-07-16 DIAGNOSIS — I4892 Unspecified atrial flutter: Secondary | ICD-10-CM | POA: Diagnosis not present

## 2018-07-16 LAB — POCT INR: INR: 2.5 (ref 2.0–3.0)

## 2018-07-16 NOTE — Patient Instructions (Signed)
Please continue of 1 tablets daily except 1.5 tablet on SUNDAYS, TUESDAYS & THURSDAYS.   Please be consistent with your green intake - please pick a day or days each week to have your greens and have them every week on that day (your pills are on a schedule, so your greens need to be on one) Recheck in 6 weeks.

## 2018-07-18 DIAGNOSIS — M1711 Unilateral primary osteoarthritis, right knee: Secondary | ICD-10-CM | POA: Diagnosis not present

## 2018-07-18 DIAGNOSIS — M25461 Effusion, right knee: Secondary | ICD-10-CM | POA: Diagnosis not present

## 2018-07-18 DIAGNOSIS — M25561 Pain in right knee: Secondary | ICD-10-CM | POA: Diagnosis not present

## 2018-07-18 DIAGNOSIS — M1120 Other chondrocalcinosis, unspecified site: Secondary | ICD-10-CM | POA: Diagnosis not present

## 2018-07-18 DIAGNOSIS — G8929 Other chronic pain: Secondary | ICD-10-CM | POA: Diagnosis not present

## 2018-07-18 DIAGNOSIS — M76891 Other specified enthesopathies of right lower limb, excluding foot: Secondary | ICD-10-CM | POA: Diagnosis not present

## 2018-07-24 ENCOUNTER — Ambulatory Visit (INDEPENDENT_AMBULATORY_CARE_PROVIDER_SITE_OTHER): Payer: Medicare Other | Admitting: Internal Medicine

## 2018-07-24 ENCOUNTER — Other Ambulatory Visit: Payer: Self-pay

## 2018-07-24 ENCOUNTER — Encounter: Payer: Self-pay | Admitting: Internal Medicine

## 2018-07-24 VITALS — BP 112/70 | HR 50 | Temp 98.2°F | Ht 70.0 in | Wt 227.0 lb

## 2018-07-24 DIAGNOSIS — D696 Thrombocytopenia, unspecified: Secondary | ICD-10-CM

## 2018-07-24 DIAGNOSIS — I4892 Unspecified atrial flutter: Secondary | ICD-10-CM

## 2018-07-24 DIAGNOSIS — I700 Atherosclerosis of aorta: Secondary | ICD-10-CM

## 2018-07-24 DIAGNOSIS — E1140 Type 2 diabetes mellitus with diabetic neuropathy, unspecified: Secondary | ICD-10-CM | POA: Diagnosis not present

## 2018-07-24 LAB — POCT GLYCOSYLATED HEMOGLOBIN (HGB A1C): Hemoglobin A1C: 6.5 % — AB (ref 4.0–5.6)

## 2018-07-24 NOTE — Assessment & Plan Note (Signed)
Mild and chronic No abnormal bleeding

## 2018-07-24 NOTE — Progress Notes (Signed)
Subjective:    Patient ID: Michael Colon, male    DOB: 04-26-1930, 85 y.o.   MRN: [id number]  HPI Here for follow up of diabetes and other chronic health conditions  Dealing with the COVID okay Just irritated by the politics, etc Stays in except for shopping, etc Wife is very limited--he does most of the chores (and they order in food from the [employer] or other restaurants)  Checks sugars every week to 10 days Often under 100 and not above 120 No clinical hypoglycemic reactions  No palpitations  Never aware of the flutter No chest pain or SOB Slight edema Continues on the warfarin  Known aortic atherosclerosis Continues on the statin  No abnormal bleeding or bruising Chronic mildly low platelet count  Current Outpatient Medications on File Prior to Visit  Medication Sig Dispense Refill  . atorvastatin (LIPITOR) 80 MG tablet TAKE 1 TABLET BY MOUTH ONCE DAILY 90 tablet 3  . docusate sodium (COLACE) 100 MG capsule Take 100 mg by mouth 2 (two) times daily.     . famotidine (PEPCID) 20 MG tablet Take 20 mg by mouth daily.     . finasteride (PROSCAR) 5 MG tablet TAKE 1 TABLET BY MOUTH ONCE DAILY 90 tablet 3  . glipiZIDE (GLUCOTROL XL) 5 MG 24 hr tablet Take 1 tablet by mouth once daily with breakfast 90 tablet 1  . Glucosamine Sulfate-MSM (GLUCOSAMINE-MSM DS) 500-500 MG TABS Take 1,000 mg by mouth daily.     Marland Kitchen glucose blood (BAYER CONTOUR TEST) test strip USE STRIP TO TEST BLOOD SUGAR ONCE DAILY. Dx Code: E11.40 100 each 3  . ibuprofen (ADVIL,MOTRIN) 200 MG tablet Take 400 mg by mouth 2 (two) times daily.     Marland Kitchen loratadine (CLARITIN) 10 MG tablet Take 10 mg by mouth daily.      . metFORMIN (GLUCOPHAGE) 500 MG tablet TAKE 1 TABLET BY MOUTH TWICE DAILY WITH MEALS 180 tablet 3  . MICROLET LANCETS MISC Use to test twice daily or as directed (Patient taking differently: Use to test twice weekly or as directed) 100 each 3  . tamsulosin (FLOMAX) 0.4 MG CAPS capsule TAKE 1 CAPSULE BY  MOUTH ONCE DAILY 90 capsule 3  . triamterene-hydrochlorothiazide (DYAZIDE) 37.5-25 MG capsule TAKE 1 CAPSULE BY MOUTH IN THE MORNING 90 capsule 3  . warfarin (COUMADIN) 5 MG tablet Take as directed by [facility] 135 tablet 1   No current facility-administered medications on file prior to visit.     Allergies  Allergen Reactions  . Adhesive [Tape] Rash  . Enoxaparin Sodium Hives  . Latex Rash    Has trouble with BANDAIDS that have been left on for more than 24 hours. Prefers paper tape.  . Nickel Rash  . Percocet [Oxycodone-Acetaminophen] Rash    Past Medical History:  Diagnosis Date  . Asthma   . Atrial flutter ([place]) 2007  . Band keratopathy   . Benign prostatic hypertrophy   . Chronic ear infection   . Chronic kidney disease   . Chronic prostatitis   . Dental bridge present    permanent - upper  . Diabetes mellitus   . Elbow stiffness, left    s/p fracture many yrs ago.  arm does not straighten  . GERD (gastroesophageal reflux disease)    laryngeal involvement  . Hyperlipidemia   . Hypertension   . Obstructive sleep apnea    CPAP-9  . Osteoarthrosis, localized, primary, knee    post-traumatic  . Prostatitis   .  Stroke [facility])    TIA  . Tachy-brady syndrome ([place])   . UTI (urinary tract infection)     Past Surgical History:  Procedure Laterality Date  . APPENDECTOMY    . BELPHAROPTOSIS REPAIR     Dr [name]---didn't resolve weepy eye and eyelid drooping  . CARDIOVERSION  04/13/2010  . CATARACT EXTRACTION, BILATERAL  2009  . Chest pain  8/12   Stress test benign  . ESOPHAGEAL DILATION  05/26/2016   Procedure: ESOPHAGEAL DILATION;  Surgeon: Lucilla Lame, MD;  Location: [place];  Service: Endoscopy;;  . ESOPHAGOGASTRODUODENOSCOPY (EGD) WITH PROPOFOL N/A 05/26/2016   Procedure: ESOPHAGOGASTRODUODENOSCOPY (EGD) WITH PROPOFOL;  Surgeon: Lucilla Lame, MD;  Location: [place];  Service: Endoscopy;  Laterality: N/A;  Diabetic - oral meds sleep  apnea  . EYE SURGERY    . FRACTURE SURGERY Left    elbow  . KNEE SURGERY  1998   plate after fracture, then removed for infection  . left elbow surgery    . MASTOIDECTOMY  8/08   Dr Idelle Crouch  . RHINOPLASTY  5/10   and septoplasty  . SUBACROMIAL DECOMPRESSION Right 2005   Arthroscopic (for rotator cuff and biceps tendon ruptures)  . TEAR DUCT PROBING  11/13   Dr Vickki Muff  . TOTAL KNEE ARTHROPLASTY Left 09/01/2014   Procedure: TOTAL KNEE ARTHROPLASTY;  Surgeon: Dereck Leep, MD;  Location: [facility];  Service: Orthopedics;  Laterality: Left;  . TRIGGER FINGER RELEASE Left 02/25/2015   Procedure: LEFT LONG TRIGGER RELEASE;  Surgeon: Dereck Leep, MD;  Location: [facility];  Service: Orthopedics;  Laterality: Left;  Marland Kitchen VENOUS ABLATION      Family History  Problem Relation Age of Onset  . Colon cancer Father   . Diabetes Father   . Hypertension Father   . Other Mother        natural causes  . Heart attack Neg Hx   . Stroke Neg Hx     Social History   Socioeconomic History  . Marital status: Married    Spouse name: Not on file  . Number of children: 2  . Years of education: Not on file  . Highest education level: Not on file  Occupational History  . Occupation: Theme park manager    Comment: Retired--[employer]  Social Needs  . Financial resource strain: Not on file  . Food insecurity    Worry: Not on file    Inability: Not on file  . Transportation needs    Medical: Not on file    Non-medical: Not on file  Tobacco Use  . Smoking status: Former Smoker    Packs/day: 1.00    Years: 0.00    Pack years: 0.00    Types: Cigarettes    Quit date: 01/03/1969    Years since quitting: 49.5  . Smokeless tobacco: Never Used  Substance and Sexual Activity  . Alcohol use: No    Alcohol/week: 1.0 standard drinks    Types: 1 Glasses of wine per week    Comment: rare wine. 1-2x/yr.  . Drug use: No  . Sexual activity: Not on file  Lifestyle  . Physical activity    Days per week: Not on  file    Minutes per session: Not on file  . Stress: Not on file  Relationships  . Social Herbalist on phone: Not on file    Gets together: Not on file    Attends religious service: Not on file  Active member of club or organization: Not on file    Attends meetings of clubs or organizations: Not on file    Relationship status: Not on file  . Intimate partner violence    Fear of current or ex partner: Not on file    Emotionally abused: Not on file    Physically abused: Not on file    Forced sexual activity: Not on file  Other Topics Concern  . Not on file  Social History Narrative   Has living will   Health care POA-- wife and then son Shanon Brow   Has DNR order from the past---form redone 06/25/10   Probably no feeding tube if cognitively unaware   Review of Systems Had fluid removed from the right knee--- has follow up coming up with Dr Marry Guan Is preparing for TKR    Objective:   Physical Exam  Constitutional: He appears well-developed. No distress.  Neck: No thyromegaly present.  Cardiovascular: Normal rate, regular rhythm, normal heart sounds and intact distal pulses. Exam reveals no gallop.  No murmur heard. Respiratory: Breath sounds normal. No respiratory distress. He has no wheezes. He has no rales.  Musculoskeletal:     Comments: Trace ankle edema  Lymphadenopathy:    He has no cervical adenopathy.  Skin:  No foot lesions  Psychiatric: He has a normal mood and affect. His behavior is normal.           Assessment & Plan:

## 2018-07-24 NOTE — Assessment & Plan Note (Signed)
Radiographic diagnosis Is on warfarin and statin

## 2018-07-24 NOTE — Assessment & Plan Note (Signed)
Continues on the warfarin No symptoms

## 2018-07-24 NOTE — Assessment & Plan Note (Signed)
Lab Results  Component Value Date   HGBA1C 6.5 (A) 07/24/2018   Still excellent control No hypoglycemia---- would cut glipizide if clinical hypoglycemia Mild neuropathy

## 2018-07-25 DIAGNOSIS — E1142 Type 2 diabetes mellitus with diabetic polyneuropathy: Secondary | ICD-10-CM | POA: Diagnosis not present

## 2018-07-25 DIAGNOSIS — M76821 Posterior tibial tendinitis, right leg: Secondary | ICD-10-CM | POA: Diagnosis not present

## 2018-07-25 DIAGNOSIS — B351 Tinea unguium: Secondary | ICD-10-CM | POA: Diagnosis not present

## 2018-07-30 ENCOUNTER — Other Ambulatory Visit: Payer: Self-pay | Admitting: Cardiovascular Disease

## 2018-07-30 NOTE — Telephone Encounter (Signed)
Please review for refill. Thank you! 

## 2018-08-17 DIAGNOSIS — M1711 Unilateral primary osteoarthritis, right knee: Secondary | ICD-10-CM | POA: Diagnosis not present

## 2018-08-19 DIAGNOSIS — M1711 Unilateral primary osteoarthritis, right knee: Secondary | ICD-10-CM | POA: Insufficient documentation

## 2018-08-29 ENCOUNTER — Ambulatory Visit (INDEPENDENT_AMBULATORY_CARE_PROVIDER_SITE_OTHER): Payer: Medicare Other

## 2018-08-29 ENCOUNTER — Other Ambulatory Visit: Payer: Self-pay

## 2018-08-29 DIAGNOSIS — Z5181 Encounter for therapeutic drug level monitoring: Secondary | ICD-10-CM

## 2018-08-29 DIAGNOSIS — I4892 Unspecified atrial flutter: Secondary | ICD-10-CM | POA: Diagnosis not present

## 2018-08-29 LAB — POCT INR: INR: 2.7 (ref 2.0–3.0)

## 2018-08-29 NOTE — Patient Instructions (Signed)
Please continue of 1 tablets daily except 1.5 tablet on SUNDAYS, TUESDAYS & THURSDAYS.   Please be consistent with your green intake - please pick a day or days each week to have your greens and have them every week on that day (your pills are on a schedule, so your greens need to be on one) Recheck in 6 weeks.  

## 2018-09-05 DIAGNOSIS — M1120 Other chondrocalcinosis, unspecified site: Secondary | ICD-10-CM | POA: Insufficient documentation

## 2018-09-05 DIAGNOSIS — G8929 Other chronic pain: Secondary | ICD-10-CM | POA: Diagnosis not present

## 2018-09-05 DIAGNOSIS — M1711 Unilateral primary osteoarthritis, right knee: Secondary | ICD-10-CM | POA: Diagnosis not present

## 2018-09-05 DIAGNOSIS — M25561 Pain in right knee: Secondary | ICD-10-CM | POA: Diagnosis not present

## 2018-09-05 DIAGNOSIS — M25461 Effusion, right knee: Secondary | ICD-10-CM | POA: Diagnosis not present

## 2018-09-12 DIAGNOSIS — M25461 Effusion, right knee: Secondary | ICD-10-CM | POA: Diagnosis not present

## 2018-09-12 DIAGNOSIS — M25561 Pain in right knee: Secondary | ICD-10-CM | POA: Diagnosis not present

## 2018-09-12 DIAGNOSIS — G8929 Other chronic pain: Secondary | ICD-10-CM | POA: Diagnosis not present

## 2018-09-12 DIAGNOSIS — M1711 Unilateral primary osteoarthritis, right knee: Secondary | ICD-10-CM | POA: Diagnosis not present

## 2018-09-13 DIAGNOSIS — E119 Type 2 diabetes mellitus without complications: Secondary | ICD-10-CM | POA: Diagnosis not present

## 2018-09-14 ENCOUNTER — Ambulatory Visit (INDEPENDENT_AMBULATORY_CARE_PROVIDER_SITE_OTHER): Payer: Medicare Other

## 2018-09-14 DIAGNOSIS — Z23 Encounter for immunization: Secondary | ICD-10-CM | POA: Diagnosis not present

## 2018-09-17 ENCOUNTER — Other Ambulatory Visit: Payer: Self-pay | Admitting: Cardiovascular Disease

## 2018-09-24 DIAGNOSIS — M1711 Unilateral primary osteoarthritis, right knee: Secondary | ICD-10-CM | POA: Diagnosis not present

## 2018-09-24 DIAGNOSIS — M25561 Pain in right knee: Secondary | ICD-10-CM | POA: Diagnosis not present

## 2018-09-24 DIAGNOSIS — G8929 Other chronic pain: Secondary | ICD-10-CM | POA: Diagnosis not present

## 2018-09-26 DIAGNOSIS — L851 Acquired keratosis [keratoderma] palmaris et plantaris: Secondary | ICD-10-CM | POA: Diagnosis not present

## 2018-09-26 DIAGNOSIS — B351 Tinea unguium: Secondary | ICD-10-CM | POA: Diagnosis not present

## 2018-09-26 DIAGNOSIS — E1142 Type 2 diabetes mellitus with diabetic polyneuropathy: Secondary | ICD-10-CM | POA: Diagnosis not present

## 2018-10-12 ENCOUNTER — Other Ambulatory Visit: Payer: Self-pay | Admitting: Internal Medicine

## 2018-10-14 NOTE — Progress Notes (Signed)
Date:  10/15/2018   ID:  Michael Colon, DOB [birth year], MRN [id number]  Patient Location:  [place] [address]   Provider location:   [facility], [place] office  PCP:  Venia Carbon, MD  Cardiologist:  Arvid Right [facility]  Chief Complaint  Patient presents with  . other    6 month follow up. Meds reviewed by the pt. verbally. "doing well."      History of Present Illness:    Michael Colon is a 85 y.o. male  past medical history of CT calcium score 1300,  atrial flutter (3/14), previous ablation , on warfarin,  hypertension,  borderline diabetes,  Obstructive sleep apnea  [facility] on August 04, 2010 with chest pain.  Symptoms started after eating and it was felt he had GI spasm or hiatal hernia. Mild improvement in symptoms with nitroglycerin and GI cocktail in the emergency room. He is very active at baseline.  He reports having a TIA some time in 2014, workup negative at [facility] Previous vein ablation surgery at [facility] Underlying diabetes type 2 S/p  right great saphenous vein and small saphenous vein laser ablation. His postprocedural duplex showed successful ablation without DVT. He presents today for follow-up of his atrial flutter, hyperlipidemia  Wife in health care, anemia, weakness, myopathy He now lives alone  Labs reviewed with him in detail today HAB1C down to 6.5 Total chol 110, LDL 36  Weight down 10 pounds, eating less  Continues to have lower extremity edema bilaterally, trace pitting Wearing compression hose  Cortisone to knee, doing better  Chronic asymptomatic bradycardia rate in the 50s No orthostasis No regular exercise program  Vertigo  better  EKG personally reviewed by myself on todays visit Sinus bradycardia, rate 45  Other past medical history reviewed Chronic leg swelling worse on the left leg than the right secondary to previous knee surgeries   Prior CV studies:   The following studies were  reviewed today:  Previously evaluated for bradycardia. Pacemaker was not recommended at that time. Previously seen by Dr. Caryl Comes, EP  Previous Holter monitor showed normal sinus rhythm with pauses up to 2.86 seconds, bradycardia with heart rates in the 20s to 30s at nighttime, 30s to 50 during the daytime.  Hemoglobin A1c in December 2014 7.5, total cholesterol up from 132 now 200  CT calcium score 1300,  stress test 04/2015 Following the CT scan, no ischemia   Past Medical History:  Diagnosis Date  . Asthma   . Atrial flutter ([place]) 2007  . Band keratopathy   . Benign prostatic hypertrophy   . Chronic ear infection   . Chronic kidney disease   . Chronic prostatitis   . Dental bridge present    permanent - upper  . Diabetes mellitus   . Elbow stiffness, left    s/p fracture many yrs ago.  arm does not straighten  . GERD (gastroesophageal reflux disease)    laryngeal involvement  . Hyperlipidemia   . Hypertension   . Obstructive sleep apnea    CPAP-9  . Osteoarthrosis, localized, primary, knee    post-traumatic  . Prostatitis   . Stroke [facility])    TIA  . Tachy-brady syndrome ([place])   . UTI (urinary tract infection)    Past Surgical History:  Procedure Laterality Date  . APPENDECTOMY    . BELPHAROPTOSIS REPAIR     Dr [name]---didn't resolve weepy eye and eyelid drooping  . CARDIOVERSION  04/13/2010  .  CATARACT EXTRACTION, BILATERAL  2009  . Chest pain  8/12   Stress test benign  . ESOPHAGEAL DILATION  05/26/2016   Procedure: ESOPHAGEAL DILATION;  Surgeon: Lucilla Lame, MD;  Location: [place];  Service: Endoscopy;;  . ESOPHAGOGASTRODUODENOSCOPY (EGD) WITH PROPOFOL N/A 05/26/2016   Procedure: ESOPHAGOGASTRODUODENOSCOPY (EGD) WITH PROPOFOL;  Surgeon: Lucilla Lame, MD;  Location: [place];  Service: Endoscopy;  Laterality: N/A;  Diabetic - oral meds sleep apnea  . EYE SURGERY    . FRACTURE SURGERY Left    elbow  . KNEE SURGERY  1998   plate after  fracture, then removed for infection  . left elbow surgery    . MASTOIDECTOMY  8/08   Dr Idelle Crouch  . RHINOPLASTY  5/10   and septoplasty  . SUBACROMIAL DECOMPRESSION Right 2005   Arthroscopic (for rotator cuff and biceps tendon ruptures)  . TEAR DUCT PROBING  11/13   Dr Vickki Muff  . TOTAL KNEE ARTHROPLASTY Left 09/01/2014   Procedure: TOTAL KNEE ARTHROPLASTY;  Surgeon: Dereck Leep, MD;  Location: [facility] ORS;  Service: Orthopedics;  Laterality: Left;  . TRIGGER FINGER RELEASE Left 02/25/2015   Procedure: LEFT LONG TRIGGER RELEASE;  Surgeon: Dereck Leep, MD;  Location: [facility];  Service: Orthopedics;  Laterality: Left;  Marland Kitchen VENOUS ABLATION       Current Meds  Medication Sig  . atorvastatin (LIPITOR) 80 MG tablet Take 1 tablet by mouth once daily  . cetirizine (ZYRTEC) 10 MG tablet Take 10 mg by mouth daily.  Marland Kitchen docusate sodium (COLACE) 100 MG capsule Take 100 mg by mouth 2 (two) times daily.   . famotidine (PEPCID) 20 MG tablet Take 20 mg by mouth daily.   . finasteride (PROSCAR) 5 MG tablet TAKE 1 TABLET BY MOUTH ONCE DAILY  . glipiZIDE (GLUCOTROL XL) 5 MG 24 hr tablet Take 1 tablet by mouth once daily with breakfast  . glucose blood (BAYER CONTOUR TEST) test strip USE STRIP TO TEST BLOOD SUGAR ONCE DAILY. Dx Code: E11.40  . ibuprofen (ADVIL,MOTRIN) 200 MG tablet Take 400 mg by mouth 2 (two) times daily.   Marland Kitchen loratadine (CLARITIN) 10 MG tablet Take 10 mg by mouth daily.    . metFORMIN (GLUCOPHAGE) 500 MG tablet TAKE 1 TABLET BY MOUTH TWICE DAILY WITH MEALS  . MICROLET LANCETS MISC Use to test twice daily or as directed (Patient taking differently: Use to test twice weekly or as directed)  . tamsulosin (FLOMAX) 0.4 MG CAPS capsule TAKE 1 CAPSULE BY MOUTH ONCE DAILY  . triamterene-hydrochlorothiazide (DYAZIDE) 37.5-25 MG capsule TAKE 1 CAPSULE BY MOUTH IN THE MORNING  . warfarin (COUMADIN) 5 MG tablet TAKE 1 TO 1 & 1/2 (ONE & ONE-HALF) TABLETS BY MOUTH ONCE DAILY AS DIRECTED      Allergies:   Enoxaparin, Enoxaparin sodium, Latex, Nickel, Percocet [oxycodone-acetaminophen], and Tape   Social History   Tobacco Use  . Smoking status: Former Smoker    Packs/day: 1.00    Years: 0.00    Pack years: 0.00    Types: Cigarettes    Quit date: 01/03/1969    Years since quitting: 49.8  . Smokeless tobacco: Never Used  Substance Use Topics  . Alcohol use: No    Alcohol/week: 1.0 standard drinks    Types: 1 Glasses of wine per week    Comment: rare wine. 1-2x/yr.  . Drug use: No     Current Outpatient Medications on File Prior to Visit  Medication Sig Dispense Refill  .  atorvastatin (LIPITOR) 80 MG tablet Take 1 tablet by mouth once daily 90 tablet 0  . cetirizine (ZYRTEC) 10 MG tablet Take 10 mg by mouth daily.    Marland Kitchen docusate sodium (COLACE) 100 MG capsule Take 100 mg by mouth 2 (two) times daily.     . famotidine (PEPCID) 20 MG tablet Take 20 mg by mouth daily.     . finasteride (PROSCAR) 5 MG tablet TAKE 1 TABLET BY MOUTH ONCE DAILY 90 tablet 3  . glipiZIDE (GLUCOTROL XL) 5 MG 24 hr tablet Take 1 tablet by mouth once daily with breakfast 90 tablet 1  . glucose blood (BAYER CONTOUR TEST) test strip USE STRIP TO TEST BLOOD SUGAR ONCE DAILY. Dx Code: E11.40 100 each 3  . ibuprofen (ADVIL,MOTRIN) 200 MG tablet Take 400 mg by mouth 2 (two) times daily.     Marland Kitchen loratadine (CLARITIN) 10 MG tablet Take 10 mg by mouth daily.      . metFORMIN (GLUCOPHAGE) 500 MG tablet TAKE 1 TABLET BY MOUTH TWICE DAILY WITH MEALS 180 tablet 3  . MICROLET LANCETS MISC Use to test twice daily or as directed (Patient taking differently: Use to test twice weekly or as directed) 100 each 3  . tamsulosin (FLOMAX) 0.4 MG CAPS capsule TAKE 1 CAPSULE BY MOUTH ONCE DAILY 90 capsule 3  . triamterene-hydrochlorothiazide (DYAZIDE) 37.5-25 MG capsule TAKE 1 CAPSULE BY MOUTH IN THE MORNING 90 capsule 3  . warfarin (COUMADIN) 5 MG tablet TAKE 1 TO 1 & 1/2 (ONE & ONE-HALF) TABLETS BY MOUTH ONCE DAILY AS DIRECTED  135 tablet 0   No current facility-administered medications on file prior to visit.      Family Hx: The patient's family history includes Colon cancer in his father; Diabetes in his father; Hypertension in his father; Other in his mother. There is no history of Heart attack or Stroke.  ROS:   Please see the history of present illness.    Review of Systems  Constitutional: Negative.   HENT: Negative.   Respiratory: Negative.   Cardiovascular: Negative.   Gastrointestinal: Negative.   Musculoskeletal: Negative.   Neurological: Negative.   Psychiatric/Behavioral: Negative.   All other systems reviewed and are negative.    Labs/Other Tests and Data Reviewed:    Recent Labs: 01/02/2018: ALT 23; BUN 25; Creatinine, Ser 0.86; Hemoglobin 15.8; Platelets 116.0; Potassium 3.6; Sodium 140   Recent Lipid Panel Lab Results  Component Value Date/Time   CHOL 110 01/02/2018 09:26 AM   TRIG 43.0 01/02/2018 09:26 AM   HDL 65.30 01/02/2018 09:26 AM   CHOLHDL 2 01/02/2018 09:26 AM   LDLCALC 36 01/02/2018 09:26 AM    Wt Readings from Last 3 Encounters:  10/15/18 222 lb 8 oz (100.9 kg)  07/24/18 227 lb (103 kg)  01/02/18 231 lb (104.8 kg)     Exam:    BP (!) 130/56 (BP Location: Right Arm, Patient Position: Sitting, Cuff Size: Normal)   Pulse (!) 45   Ht 5\' 10"  (1.778 m)   Wt 222 lb 8 oz (100.9 kg)   BMI 31.93 kg/m  Constitutional:  oriented to person, place, and time. No distress.  HENT:  Head: Grossly normal Eyes:  no discharge. No scleral icterus.  Neck: No JVD, no carotid bruits  Cardiovascular: Regular rate and rhythm, no murmurs appreciated Trace pitting lower extremity edema around the ankles Pulmonary/Chest: Clear to auscultation bilaterally, no wheezes or rails Abdominal: Soft.  no distension.  no tenderness.  Musculoskeletal: Normal range of  motion Neurological:  normal muscle tone. Coordination normal. No atrophy Skin: Skin warm and dry Psychiatric: normal affect,  pleasant   ASSESSMENT & PLAN:    Atrial fibrillation/flutter On anticoagulation, asymptomatic Maintaining sinus bradycardia  Tachy-brady syndrome (HCC) Stable Asymptomatic from his bradycardia, long discussion with him  Mixed hyperlipidemia Cholesterol is at goal on the current lipid regimen. No changes to the medications were made.  Stable  Essential hypertension Blood pressure is well controlled on today's visit. No changes made to the medications.  Stable  Type 2 diabetes, controlled, with neuropathy ([place]) Dramatic improvement in his diabetes numbers,  Weight down  Aortic atherosclerosis (HCC) Cholesterol is at goal on the current lipid regimen. No changes to the medications were made.  Obstructive sleep apnea On cpap, weight down Followed by pulmonary  Time:   Today, I have spent 25 minutes with the patient with telehealth technology discussing vertigo, atrial fibrillation/flutter, anticoagulation, diabetes and lipid control.     Medication Adjustments/Labs and Tests Ordered: Current medicines are reviewed at length with the patient today.  Concerns regarding medicines are outlined above.   Tests Ordered: No tests ordered   Medication Changes: No changes made   Disposition: Follow-up in 6 months   Signed, Ida Rogue, MD  10/15/2018 11:06 AM    [place] Office [address] [place] [address], [place], [place] [address]

## 2018-10-15 ENCOUNTER — Ambulatory Visit (INDEPENDENT_AMBULATORY_CARE_PROVIDER_SITE_OTHER): Payer: Medicare Other | Admitting: Cardiovascular Disease

## 2018-10-15 ENCOUNTER — Ambulatory Visit (INDEPENDENT_AMBULATORY_CARE_PROVIDER_SITE_OTHER): Payer: Medicare Other

## 2018-10-15 ENCOUNTER — Other Ambulatory Visit: Payer: Self-pay

## 2018-10-15 ENCOUNTER — Encounter: Payer: Self-pay | Admitting: Cardiovascular Disease

## 2018-10-15 VITALS — BP 130/56 | HR 45 | Ht 70.0 in | Wt 222.5 lb

## 2018-10-15 DIAGNOSIS — I10 Essential (primary) hypertension: Secondary | ICD-10-CM

## 2018-10-15 DIAGNOSIS — Z5181 Encounter for therapeutic drug level monitoring: Secondary | ICD-10-CM | POA: Diagnosis not present

## 2018-10-15 DIAGNOSIS — I4892 Unspecified atrial flutter: Secondary | ICD-10-CM | POA: Diagnosis not present

## 2018-10-15 DIAGNOSIS — I700 Atherosclerosis of aorta: Secondary | ICD-10-CM | POA: Diagnosis not present

## 2018-10-15 DIAGNOSIS — I472 Ventricular tachycardia, unspecified: Secondary | ICD-10-CM

## 2018-10-15 DIAGNOSIS — E782 Mixed hyperlipidemia: Secondary | ICD-10-CM | POA: Diagnosis not present

## 2018-10-15 DIAGNOSIS — E1140 Type 2 diabetes mellitus with diabetic neuropathy, unspecified: Secondary | ICD-10-CM

## 2018-10-15 DIAGNOSIS — I495 Sick sinus syndrome: Secondary | ICD-10-CM | POA: Diagnosis not present

## 2018-10-15 LAB — POCT INR: INR: 2.5 (ref 2.0–3.0)

## 2018-10-15 NOTE — Patient Instructions (Signed)
Please continue of 1 tablets daily except 1.5 tablet on SUNDAYS, TUESDAYS & THURSDAYS.   Please be consistent with your green intake - please pick a day or days each week to have your greens and have them every week on that day (your pills are on a schedule, so your greens need to be on one) Recheck in 6 weeks.  

## 2018-10-15 NOTE — Patient Instructions (Signed)

## 2018-11-22 ENCOUNTER — Other Ambulatory Visit: Payer: Self-pay | Admitting: Cardiovascular Disease

## 2018-11-22 NOTE — Telephone Encounter (Signed)
Refill Request.  

## 2018-11-26 ENCOUNTER — Ambulatory Visit (INDEPENDENT_AMBULATORY_CARE_PROVIDER_SITE_OTHER): Payer: Medicare Other

## 2018-11-26 ENCOUNTER — Other Ambulatory Visit: Payer: Self-pay

## 2018-11-26 DIAGNOSIS — I4892 Unspecified atrial flutter: Secondary | ICD-10-CM | POA: Diagnosis not present

## 2018-11-26 DIAGNOSIS — Z5181 Encounter for therapeutic drug level monitoring: Secondary | ICD-10-CM

## 2018-11-26 LAB — POCT INR: INR: 2.3 (ref 2.0–3.0)

## 2018-11-26 NOTE — Patient Instructions (Signed)
Please continue of 1 tablets daily except 1.5 tablet on SUNDAYS, TUESDAYS & THURSDAYS.   Please be consistent with your green intake - please pick a day or days each week to have your greens and have them every week on that day (your pills are on a schedule, so your greens need to be on one) Recheck in 6 weeks.

## 2018-11-27 ENCOUNTER — Other Ambulatory Visit: Payer: Self-pay

## 2018-12-03 DIAGNOSIS — B351 Tinea unguium: Secondary | ICD-10-CM | POA: Diagnosis not present

## 2018-12-03 DIAGNOSIS — L851 Acquired keratosis [keratoderma] palmaris et plantaris: Secondary | ICD-10-CM | POA: Diagnosis not present

## 2018-12-03 DIAGNOSIS — E1142 Type 2 diabetes mellitus with diabetic polyneuropathy: Secondary | ICD-10-CM | POA: Diagnosis not present

## 2018-12-13 ENCOUNTER — Other Ambulatory Visit: Payer: Self-pay | Admitting: Cardiovascular Disease

## 2019-01-07 ENCOUNTER — Ambulatory Visit (INDEPENDENT_AMBULATORY_CARE_PROVIDER_SITE_OTHER): Payer: Medicare Other

## 2019-01-07 ENCOUNTER — Other Ambulatory Visit: Payer: Self-pay

## 2019-01-07 DIAGNOSIS — I4892 Unspecified atrial flutter: Secondary | ICD-10-CM

## 2019-01-07 DIAGNOSIS — Z5181 Encounter for therapeutic drug level monitoring: Secondary | ICD-10-CM | POA: Diagnosis not present

## 2019-01-07 LAB — POCT INR: INR: 3.4 — AB (ref 2.0–3.0)

## 2019-01-07 NOTE — Patient Instructions (Signed)
Please SKIP WARFARIN TONIGHT, then continue of 1 tablets daily except 1.5 tablet on SUNDAYS, TUESDAYS & THURSDAYS.   Please be consistent with your green intake - please pick a day or days each week to have your greens and have them every week on that day (your pills are on a schedule, so your greens need to be on one) Recheck in 6 weeks.

## 2019-01-12 ENCOUNTER — Other Ambulatory Visit: Payer: Self-pay | Admitting: Internal Medicine

## 2019-01-18 DIAGNOSIS — Z23 Encounter for immunization: Secondary | ICD-10-CM | POA: Diagnosis not present

## 2019-02-01 ENCOUNTER — Encounter: Payer: Medicare Other | Admitting: Internal Medicine

## 2019-02-04 DIAGNOSIS — D2262 Melanocytic nevi of left upper limb, including shoulder: Secondary | ICD-10-CM | POA: Diagnosis not present

## 2019-02-04 DIAGNOSIS — D2272 Melanocytic nevi of left lower limb, including hip: Secondary | ICD-10-CM | POA: Diagnosis not present

## 2019-02-04 DIAGNOSIS — D2261 Melanocytic nevi of right upper limb, including shoulder: Secondary | ICD-10-CM | POA: Diagnosis not present

## 2019-02-04 DIAGNOSIS — X32XXXA Exposure to sunlight, initial encounter: Secondary | ICD-10-CM | POA: Diagnosis not present

## 2019-02-04 DIAGNOSIS — Z85828 Personal history of other malignant neoplasm of skin: Secondary | ICD-10-CM | POA: Diagnosis not present

## 2019-02-04 DIAGNOSIS — L821 Other seborrheic keratosis: Secondary | ICD-10-CM | POA: Diagnosis not present

## 2019-02-04 DIAGNOSIS — L570 Actinic keratosis: Secondary | ICD-10-CM | POA: Diagnosis not present

## 2019-02-06 DIAGNOSIS — L851 Acquired keratosis [keratoderma] palmaris et plantaris: Secondary | ICD-10-CM | POA: Diagnosis not present

## 2019-02-06 DIAGNOSIS — E1142 Type 2 diabetes mellitus with diabetic polyneuropathy: Secondary | ICD-10-CM | POA: Diagnosis not present

## 2019-02-06 DIAGNOSIS — B351 Tinea unguium: Secondary | ICD-10-CM | POA: Diagnosis not present

## 2019-02-07 ENCOUNTER — Other Ambulatory Visit: Payer: Self-pay | Admitting: Internal Medicine

## 2019-02-15 ENCOUNTER — Encounter: Payer: Self-pay | Admitting: Internal Medicine

## 2019-02-15 ENCOUNTER — Ambulatory Visit (INDEPENDENT_AMBULATORY_CARE_PROVIDER_SITE_OTHER): Payer: Medicare Other | Admitting: Internal Medicine

## 2019-02-15 ENCOUNTER — Other Ambulatory Visit: Payer: Self-pay

## 2019-02-15 VITALS — BP 118/74 | HR 60 | Temp 96.9°F | Ht 70.0 in | Wt 221.0 lb

## 2019-02-15 DIAGNOSIS — Z7189 Other specified counseling: Secondary | ICD-10-CM

## 2019-02-15 DIAGNOSIS — Z23 Encounter for immunization: Secondary | ICD-10-CM | POA: Diagnosis not present

## 2019-02-15 DIAGNOSIS — D696 Thrombocytopenia, unspecified: Secondary | ICD-10-CM

## 2019-02-15 DIAGNOSIS — E1140 Type 2 diabetes mellitus with diabetic neuropathy, unspecified: Secondary | ICD-10-CM

## 2019-02-15 DIAGNOSIS — Z0000 Encounter for general adult medical examination without abnormal findings: Secondary | ICD-10-CM

## 2019-02-15 DIAGNOSIS — K219 Gastro-esophageal reflux disease without esophagitis: Secondary | ICD-10-CM

## 2019-02-15 DIAGNOSIS — N401 Enlarged prostate with lower urinary tract symptoms: Secondary | ICD-10-CM | POA: Diagnosis not present

## 2019-02-15 DIAGNOSIS — I700 Atherosclerosis of aorta: Secondary | ICD-10-CM

## 2019-02-15 DIAGNOSIS — N138 Other obstructive and reflux uropathy: Secondary | ICD-10-CM

## 2019-02-15 DIAGNOSIS — I4892 Unspecified atrial flutter: Secondary | ICD-10-CM

## 2019-02-15 LAB — RENAL FUNCTION PANEL
Albumin: 4 g/dL (ref 3.5–5.2)
BUN: 21 mg/dL (ref 6–23)
CO2: 31 mEq/L (ref 19–32)
Calcium: 10.4 mg/dL (ref 8.4–10.5)
Chloride: 106 mEq/L (ref 96–112)
Creatinine, Ser: 0.8 mg/dL (ref 0.40–1.50)
GFR: 91.6 mL/min (ref >60.00)
Glucose, Bld: 98 mg/dL (ref 70–99)
Phosphorus: 2.9 mg/dL (ref 2.3–4.6)
Potassium: 3.5 mEq/L (ref 3.5–5.1)
Sodium: 143 mEq/L (ref 135–145)

## 2019-02-15 LAB — CBC
HCT: 44 % (ref 39.0–52.0)
Hemoglobin: 14.9 g/dL (ref 13.0–17.0)
MCHC: 33.8 g/dL (ref 30.0–36.0)
MCV: 93.3 fl (ref 78.0–100.0)
Platelets: 111 10*3/uL — ABNORMAL LOW (ref 150.0–400.0)
RBC: 4.71 Mil/uL (ref 4.22–5.81)
RDW: 13.1 % (ref 11.5–15.5)
WBC: 6.7 10*3/uL (ref 4.0–10.5)

## 2019-02-15 LAB — HM DIABETES FOOT EXAM

## 2019-02-15 LAB — HEPATIC FUNCTION PANEL
ALT: 18 U/L (ref 0–53)
AST: 16 U/L (ref 0–37)
Albumin: 4 g/dL (ref 3.5–5.2)
Alkaline Phosphatase: 70 U/L (ref 39–117)
Bilirubin, Direct: 0.3 mg/dL (ref 0.0–0.3)
Total Bilirubin: 1 mg/dL (ref 0.2–1.2)
Total Protein: 6.3 g/dL (ref 6.0–8.3)

## 2019-02-15 LAB — LIPID PANEL
Cholesterol: 110 mg/dL (ref 0–200)
HDL: 64.6 mg/dL (ref >39.00)
LDL Cholesterol: 37 mg/dL (ref 0–99)
NonHDL: 45.79
Total CHOL/HDL Ratio: 2
Triglycerides: 46 mg/dL (ref 0.0–149.0)
VLDL: 9.2 mg/dL (ref 0.0–40.0)

## 2019-02-15 LAB — T4, FREE: Free T4: 1.16 ng/dL (ref 0.60–1.60)

## 2019-02-15 LAB — HEMOGLOBIN A1C: Hgb A1c MFr Bld: 6.7 % — ABNORMAL HIGH (ref 4.6–6.5)

## 2019-02-15 MED ORDER — MICROLET LANCETS MISC: 3 refills | Status: DC

## 2019-02-15 MED ORDER — BAYER CONTOUR MONITOR DEVI: 1.0000 | Freq: Once | 0 refills | Status: AC

## 2019-02-15 MED ORDER — TRIAMTERENE-HCTZ 37.5-25 MG PO CAPS: ORAL_CAPSULE | ORAL | 3 refills | Status: DC

## 2019-02-15 MED ORDER — ATORVASTATIN CALCIUM 80 MG PO TABS: 80.0000 mg | ORAL_TABLET | Freq: Every day | ORAL | 3 refills | Status: DC

## 2019-02-15 MED ORDER — WARFARIN SODIUM 5 MG PO TABS: ORAL_TABLET | ORAL | 1 refills | Status: DC

## 2019-02-15 MED ORDER — GLUCOSE BLOOD VI STRP: ORAL_STRIP | 3 refills | Status: DC

## 2019-02-15 NOTE — Assessment & Plan Note (Signed)
I have personally reviewed the Medicare Annual Wellness questionnaire and have noted 1. The patient's medical and social history 2. Their use of alcohol, tobacco or illicit drugs 3. Their current medications and supplements 4. The patient's functional ability including ADL's, fall risks, home safety risks and hearing or visual             impairment. 5. Diet and physical activities 6. Evidence for depression or mood disorders  The patients weight, height, BMI and visual acuity have been recorded in the chart I have made referrals, counseling and provided education to the patient based review of the above and I have provided the pt with a written personalized care plan for preventive services.  I have provided you with a copy of your personalized plan for preventive services. Please take the time to review along with your updated medication list.  No cancer screening due to age Will get second COVID vaccine Yearly flu vaccine Td due next year Exercising regularly Consider shingrix at pharmacy

## 2019-02-15 NOTE — Progress Notes (Signed)
Subjective:    Patient ID: Michael Colon, male    DOB: 1930-06-08, 86 y.o.   MRN: [id number]  HPI Here for Medicare wellness visit and follow up of chronic health conditions This visit occurred during the SARS-CoV-2 public health emergency.  Safety protocols were in place, including screening questions prior to the visit, additional usage of staff PPE, and extensive cleaning of exam room while observing appropriate contact time as indicated for disinfecting solutions.   Reviewed form and advanced directives Reviewed other doctors No alcohol or tobacco Does exercise regularly No falls No depression (other than grieving). Lethargy is now better--not anhedonic Vision okay---needs reading glasses more. Hearing loss in right ear---planning to get hearing aides Independent with instrumental ADLs No sig memory loss--occ mild issues  Wife recently died He feels he is doing okay Does have friends that are keeping up with him  Sporadically checks sugars. Usually 80-110 No hypoglycemia  Some leg pain at times--not really numb  No apparent spells of atrial flutter--paroxysmal No palpitations  No chest pain or SOB Exercise tolerance is stable--some DOE on stairs Known aortic atherosclerosis  No abnormal bleeding Does bruise easily  Current Outpatient Medications on File Prior to Visit  Medication Sig Dispense Refill  . atorvastatin (LIPITOR) 80 MG tablet Take 1 tablet by mouth once daily 90 tablet 0  . cetirizine (ZYRTEC) 10 MG tablet Take 10 mg by mouth daily.    Marland Kitchen docusate sodium (COLACE) 100 MG capsule Take 100 mg by mouth 2 (two) times daily.     . famotidine (PEPCID) 20 MG tablet Take 20 mg by mouth daily.     . finasteride (PROSCAR) 5 MG tablet Take 1 tablet by mouth once daily 90 tablet 3  . glipiZIDE (GLUCOTROL XL) 5 MG 24 hr tablet Take 1 tablet by mouth once daily with breakfast 90 tablet 1  . glucose blood (BAYER CONTOUR TEST) test strip USE STRIP TO TEST BLOOD SUGAR  ONCE DAILY. Dx Code: E11.40 100 each 3  . ibuprofen (ADVIL,MOTRIN) 200 MG tablet Take 400 mg by mouth 2 (two) times daily.     . metFORMIN (GLUCOPHAGE) 500 MG tablet TAKE 1 TABLET BY MOUTH TWICE DAILY WITH MEALS 180 tablet 3  . MICROLET LANCETS MISC Use to test twice daily or as directed (Patient taking differently: Use to test twice weekly or as directed) 100 each 3  . tamsulosin (FLOMAX) 0.4 MG CAPS capsule Take 1 capsule by mouth once daily 90 capsule 0  . triamterene-hydrochlorothiazide (DYAZIDE) 37.5-25 MG capsule Take 1 capsule by mouth in the morning 90 capsule 0  . warfarin (COUMADIN) 5 MG tablet TAKE 1 TO 1 & 1/2 (ONE & ONE-HALF) TABLETS BY MOUTH ONCE DAILY AS DIRECTED 135 tablet 0  . loratadine (CLARITIN) 10 MG tablet Take 10 mg by mouth daily.       No current facility-administered medications on file prior to visit.    Allergies  Allergen Reactions  . Enoxaparin Hives  . Enoxaparin Sodium Hives  . Latex Rash    Has trouble with BANDAIDS that have been left on for more than 24 hours. Prefers paper tape.  . Nickel Rash  . Percocet [Oxycodone-Acetaminophen] Rash  . Tape Rash and Other (See Comments)    Sensitivity     Past Medical History:  Diagnosis Date  . Asthma   . Atrial flutter ([place]) 2007  . Band keratopathy   . Benign prostatic hypertrophy   . Chronic ear infection   . Chronic  kidney disease   . Chronic prostatitis   . Dental bridge present    permanent - upper  . Diabetes mellitus   . Elbow stiffness, left    s/p fracture many yrs ago.  arm does not straighten  . GERD (gastroesophageal reflux disease)    laryngeal involvement  . Hyperlipidemia   . Hypertension   . Obstructive sleep apnea    CPAP-9  . Osteoarthrosis, localized, primary, knee    post-traumatic  . Prostatitis   . Stroke [facility])    TIA  . Tachy-brady syndrome ([place])   . UTI (urinary tract infection)     Past Surgical History:  Procedure Laterality Date  . APPENDECTOMY    .  BELPHAROPTOSIS REPAIR     Dr [name]---didn't resolve weepy eye and eyelid drooping  . CARDIOVERSION  04/13/2010  . CATARACT EXTRACTION, BILATERAL  2009  . Chest pain  8/12   Stress test benign  . ESOPHAGEAL DILATION  05/26/2016   Procedure: ESOPHAGEAL DILATION;  Surgeon: Lucilla Lame, MD;  Location: [place];  Service: Endoscopy;;  . ESOPHAGOGASTRODUODENOSCOPY (EGD) WITH PROPOFOL N/A 05/26/2016   Procedure: ESOPHAGOGASTRODUODENOSCOPY (EGD) WITH PROPOFOL;  Surgeon: Lucilla Lame, MD;  Location: [place];  Service: Endoscopy;  Laterality: N/A;  Diabetic - oral meds sleep apnea  . EYE SURGERY    . FRACTURE SURGERY Left    elbow  . KNEE SURGERY  1998   plate after fracture, then removed for infection  . left elbow surgery    . MASTOIDECTOMY  8/08   Dr Idelle Crouch  . RHINOPLASTY  5/10   and septoplasty  . SUBACROMIAL DECOMPRESSION Right 2005   Arthroscopic (for rotator cuff and biceps tendon ruptures)  . TEAR DUCT PROBING  11/13   Dr Vickki Muff  . TOTAL KNEE ARTHROPLASTY Left 09/01/2014   Procedure: TOTAL KNEE ARTHROPLASTY;  Surgeon: Dereck Leep, MD;  Location: [facility];  Service: Orthopedics;  Laterality: Left;  . TRIGGER FINGER RELEASE Left 02/25/2015   Procedure: LEFT LONG TRIGGER RELEASE;  Surgeon: Dereck Leep, MD;  Location: [facility];  Service: Orthopedics;  Laterality: Left;  Marland Kitchen VENOUS ABLATION      Family History  Problem Relation Age of Onset  . Colon cancer Father   . Diabetes Father   . Hypertension Father   . Other Mother        natural causes  . Heart attack Neg Hx   . Stroke Neg Hx     Social History   Socioeconomic History  . Marital status: Married    Spouse name: Not on file  . Number of children: 2  . Years of education: Not on file  . Highest education level: Not on file  Occupational History  . Occupation: Theme park manager    Comment: Retired--[employer]  Tobacco Use  . Smoking status: Former Smoker    Packs/day: 1.00    Years: 0.00    Pack  years: 0.00    Types: Cigarettes    Quit date: 01/03/1969    Years since quitting: 50.1  . Smokeless tobacco: Never Used  Substance and Sexual Activity  . Alcohol use: No    Alcohol/week: 1.0 standard drinks    Types: 1 Glasses of wine per week    Comment: rare wine. 1-2x/yr.  . Drug use: No  . Sexual activity: Not on file  Other Topics Concern  . Not on file  Social History Narrative   Has living will   Health care POA-- son Shanon Brow  Has DNR order from the past---form redone 06/25/10   Probably no feeding tube if cognitively unaware   Social Determinants of Health   Financial Resource Strain:   . Difficulty of Paying Living Expenses: Not on file  Food Insecurity:   . Worried About Charity fundraiser in the Last Year: Not on file  . Ran Out of Food in the Last Year: Not on file  Transportation Needs:   . Lack of Transportation (Medical): Not on file  . Lack of Transportation (Non-Medical): Not on file  Physical Activity:   . Days of Exercise per Week: Not on file  . Minutes of Exercise per Session: Not on file  Stress:   . Feeling of Stress : Not on file  Social Connections:   . Frequency of Communication with Friends and Family: Not on file  . Frequency of Social Gatherings with Friends and Family: Not on file  . Attends Religious Services: Not on file  . Active Member of Clubs or Organizations: Not on file  . Attends Archivist Meetings: Not on file  . Marital Status: Not on file  Intimate Partner Violence:   . Fear of Current or Ex-Partner: Not on file  . Emotionally Abused: Not on file  . Physically Abused: Not on file  . Sexually Abused: Not on file   Review of Systems Eating okay Has lost a bit of weight---trying to eat smaller portions Sleeps okay mostly---does have some nighttime awakening  Knee arthritis--getting injections Has had actinics treated on face---no active suspicious lesions now Wears seat belt Has dental appt coming up--teeth  okay No heartburn. Mild symptoms with swallowing on chronic meds Some arthritis in hands as well Ongoing urinary symptoms--no sig change    Objective:   Physical Exam  Constitutional: He is oriented to person, place, and time. He appears well-developed. No distress.  HENT:  Mouth/Throat: Oropharynx is clear and moist. No oropharyngeal exudate.  Neck: No thyromegaly present.  Cardiovascular: Normal rate, regular rhythm, normal heart sounds and intact distal pulses. Exam reveals no gallop.  No murmur heard. Respiratory: Effort normal and breath sounds normal. No respiratory distress. He has no wheezes. He has no rales.  GI: Soft. There is no abdominal tenderness.  Musculoskeletal:        General: No tenderness.     Comments: Faint edema  Lymphadenopathy:    He has no cervical adenopathy.  Neurological: He is alert and oriented to person, place, and time.  President--- "Biden, Obama-----Trump" [id number] Recall 3/3  Slight decreased sensation in feet  Skin: No rash noted.  No foot lesions  Psychiatric: He has a normal mood and affect. His behavior is normal.           Assessment & Plan:

## 2019-02-15 NOTE — Assessment & Plan Note (Signed)
Has had some dysphagia but nothing serious Video exam was okay Continues on the famotidine

## 2019-02-15 NOTE — Assessment & Plan Note (Signed)
Easy bruising is common Will recheck labs today

## 2019-02-15 NOTE — Assessment & Plan Note (Signed)
Sugar control seems to be good still Very mild neuropathy Discussed cutting back on glipizide if any hypoglycemia

## 2019-02-15 NOTE — Progress Notes (Signed)
Hearing Screening   125Hz  250Hz  500Hz  1000Hz  2000Hz  3000Hz  4000Hz  6000Hz  8000Hz   Right ear:           Left ear:           Comments: Realizes he has a hearing issue. About to purchase hearing aid  Vision Screening Comments: September 2020

## 2019-02-15 NOTE — Addendum Note (Signed)
Addended by: Pilar Grammes on: 02/15/2019 02:41 PM   Modules accepted: Orders

## 2019-02-15 NOTE — Addendum Note (Signed)
Addended by: Pilar Grammes on: 02/15/2019 10:25 AM   Modules accepted: Orders

## 2019-02-15 NOTE — Assessment & Plan Note (Signed)
Some dribbling issues--has to wait or even sit regularly Still on tamsulosin and finasteride

## 2019-02-15 NOTE — Assessment & Plan Note (Signed)
See social history 

## 2019-02-15 NOTE — Assessment & Plan Note (Signed)
No symptoms and low burden likely Continues on the warfarin

## 2019-02-15 NOTE — Assessment & Plan Note (Signed)
Known issues on radiology tests No action now due to his age

## 2019-02-18 ENCOUNTER — Ambulatory Visit (INDEPENDENT_AMBULATORY_CARE_PROVIDER_SITE_OTHER): Payer: Medicare Other

## 2019-02-18 ENCOUNTER — Other Ambulatory Visit: Payer: Self-pay

## 2019-02-18 DIAGNOSIS — Z5181 Encounter for therapeutic drug level monitoring: Secondary | ICD-10-CM

## 2019-02-18 DIAGNOSIS — I4892 Unspecified atrial flutter: Secondary | ICD-10-CM

## 2019-02-18 LAB — POCT INR: INR: 2.1 (ref 2.0–3.0)

## 2019-02-18 NOTE — Patient Instructions (Signed)
Please continue warfarin dosage of 1 tablets daily except 1.5 tablet on SUNDAYS, TUESDAYS & THURSDAYS.   Please be consistent with your green intake - please pick a day or days each week to have your greens and have them every week on that day (your pills are on a schedule, so your greens need to be on one) Recheck in 6 weeks.  

## 2019-02-22 ENCOUNTER — Other Ambulatory Visit: Payer: Self-pay

## 2019-02-22 MED ORDER — TAMSULOSIN HCL 0.4 MG PO CAPS: 0.4000 mg | ORAL_CAPSULE | Freq: Every day | ORAL | 2 refills | Status: DC

## 2019-02-22 MED ORDER — GLIPIZIDE ER 5 MG PO TB24: ORAL_TABLET | ORAL | 2 refills | Status: DC

## 2019-02-25 MED ORDER — BAYER CONTOUR MONITOR DEVI: 1.0000 | Freq: Once | 0 refills | Status: AC

## 2019-02-25 MED ORDER — GLUCOSE BLOOD VI STRP: ORAL_STRIP | 3 refills | Status: DC

## 2019-03-18 DIAGNOSIS — H18423 Band keratopathy, bilateral: Secondary | ICD-10-CM | POA: Diagnosis not present

## 2019-03-18 LAB — HM DIABETES EYE EXAM

## 2019-04-01 ENCOUNTER — Other Ambulatory Visit: Payer: Self-pay

## 2019-04-01 ENCOUNTER — Ambulatory Visit (INDEPENDENT_AMBULATORY_CARE_PROVIDER_SITE_OTHER): Payer: Medicare Other

## 2019-04-01 DIAGNOSIS — I4892 Unspecified atrial flutter: Secondary | ICD-10-CM | POA: Diagnosis not present

## 2019-04-01 DIAGNOSIS — Z5181 Encounter for therapeutic drug level monitoring: Secondary | ICD-10-CM | POA: Diagnosis not present

## 2019-04-01 LAB — POCT INR: INR: 2.3 (ref 2.0–3.0)

## 2019-04-01 NOTE — Patient Instructions (Signed)
Please continue warfarin dosage of 1 tablets daily except 1.5 tablet on SUNDAYS, TUESDAYS & THURSDAYS.   Please be consistent with your green intake - please pick a day or days each week to have your greens and have them every week on that day (your pills are on a schedule, so your greens need to be on one) Recheck in 6 weeks.  

## 2019-04-15 DIAGNOSIS — B351 Tinea unguium: Secondary | ICD-10-CM | POA: Diagnosis not present

## 2019-04-15 DIAGNOSIS — E1142 Type 2 diabetes mellitus with diabetic polyneuropathy: Secondary | ICD-10-CM | POA: Diagnosis not present

## 2019-04-15 DIAGNOSIS — D485 Neoplasm of uncertain behavior of skin: Secondary | ICD-10-CM | POA: Diagnosis not present

## 2019-04-24 DIAGNOSIS — G8929 Other chronic pain: Secondary | ICD-10-CM | POA: Diagnosis not present

## 2019-04-24 DIAGNOSIS — M25561 Pain in right knee: Secondary | ICD-10-CM | POA: Diagnosis not present

## 2019-04-24 DIAGNOSIS — M76891 Other specified enthesopathies of right lower limb, excluding foot: Secondary | ICD-10-CM | POA: Diagnosis not present

## 2019-04-24 DIAGNOSIS — M25461 Effusion, right knee: Secondary | ICD-10-CM | POA: Diagnosis not present

## 2019-04-24 DIAGNOSIS — M1711 Unilateral primary osteoarthritis, right knee: Secondary | ICD-10-CM | POA: Diagnosis not present

## 2019-04-24 DIAGNOSIS — M1120 Other chondrocalcinosis, unspecified site: Secondary | ICD-10-CM | POA: Diagnosis not present

## 2019-05-01 DIAGNOSIS — M76891 Other specified enthesopathies of right lower limb, excluding foot: Secondary | ICD-10-CM | POA: Diagnosis not present

## 2019-05-01 DIAGNOSIS — M1711 Unilateral primary osteoarthritis, right knee: Secondary | ICD-10-CM | POA: Diagnosis not present

## 2019-05-01 DIAGNOSIS — M25561 Pain in right knee: Secondary | ICD-10-CM | POA: Diagnosis not present

## 2019-05-01 DIAGNOSIS — G8929 Other chronic pain: Secondary | ICD-10-CM | POA: Diagnosis not present

## 2019-05-01 DIAGNOSIS — M25461 Effusion, right knee: Secondary | ICD-10-CM | POA: Diagnosis not present

## 2019-05-01 DIAGNOSIS — M1120 Other chondrocalcinosis, unspecified site: Secondary | ICD-10-CM | POA: Diagnosis not present

## 2019-05-06 DIAGNOSIS — D2371 Other benign neoplasm of skin of right lower limb, including hip: Secondary | ICD-10-CM | POA: Diagnosis not present

## 2019-05-06 DIAGNOSIS — M79671 Pain in right foot: Secondary | ICD-10-CM | POA: Diagnosis not present

## 2019-05-08 DIAGNOSIS — M1711 Unilateral primary osteoarthritis, right knee: Secondary | ICD-10-CM | POA: Diagnosis not present

## 2019-05-08 DIAGNOSIS — G8929 Other chronic pain: Secondary | ICD-10-CM | POA: Diagnosis not present

## 2019-05-08 DIAGNOSIS — M25461 Effusion, right knee: Secondary | ICD-10-CM | POA: Diagnosis not present

## 2019-05-08 DIAGNOSIS — M25561 Pain in right knee: Secondary | ICD-10-CM | POA: Diagnosis not present

## 2019-05-13 ENCOUNTER — Ambulatory Visit (INDEPENDENT_AMBULATORY_CARE_PROVIDER_SITE_OTHER): Payer: Medicare Other

## 2019-05-13 ENCOUNTER — Other Ambulatory Visit: Payer: Self-pay

## 2019-05-13 DIAGNOSIS — Z5181 Encounter for therapeutic drug level monitoring: Secondary | ICD-10-CM | POA: Diagnosis not present

## 2019-05-13 DIAGNOSIS — I4892 Unspecified atrial flutter: Secondary | ICD-10-CM | POA: Diagnosis not present

## 2019-05-13 LAB — POCT INR: INR: 2.7 (ref 2.0–3.0)

## 2019-05-13 NOTE — Patient Instructions (Signed)
Please continue warfarin dosage of 1 tablets daily except 1.5 tablet on SUNDAYS, TUESDAYS & THURSDAYS.   Please be consistent with your green intake - please pick a day or days each week to have your greens and have them every week on that day (your pills are on a schedule, so your greens need to be on one) Recheck in 6 weeks.  

## 2019-06-23 ENCOUNTER — Other Ambulatory Visit: Payer: Self-pay | Admitting: Internal Medicine

## 2019-06-24 ENCOUNTER — Other Ambulatory Visit: Payer: Self-pay

## 2019-06-24 ENCOUNTER — Ambulatory Visit (INDEPENDENT_AMBULATORY_CARE_PROVIDER_SITE_OTHER): Payer: Medicare Other

## 2019-06-24 DIAGNOSIS — Z5181 Encounter for therapeutic drug level monitoring: Secondary | ICD-10-CM

## 2019-06-24 DIAGNOSIS — I4892 Unspecified atrial flutter: Secondary | ICD-10-CM

## 2019-06-24 LAB — POCT INR: INR: 2.2 (ref 2.0–3.0)

## 2019-06-24 NOTE — Telephone Encounter (Signed)
Pt consistent with coumadin management. Sent in script

## 2019-06-24 NOTE — Patient Instructions (Signed)
Please continue warfarin dosage of 1 tablets daily except 1.5 tablet on SUNDAYS, TUESDAYS & THURSDAYS.   Please be consistent with your green intake - please pick a day or days each week to have your greens and have them every week on that day (your pills are on a schedule, so your greens need to be on one) Recheck in 6 weeks.

## 2019-07-03 ENCOUNTER — Telehealth: Payer: Self-pay | Admitting: Internal Medicine

## 2019-07-03 NOTE — Telephone Encounter (Signed)
Patient called in regards to his cpap. He stated that did not remember who gave the script for his cpap./but he is needing a new script for his cpap supplies. He stated he is now using [employer].   Patient thought it came from a doctor he seen in [place]. He wanted to know since Dr Silvio Pate is his primary provider if he could get a script for him    Patient is requesting a call back

## 2019-07-04 NOTE — Telephone Encounter (Signed)
Rx written.

## 2019-07-04 NOTE — Telephone Encounter (Signed)
Spoke to pt. He just needs a rx for supplies: CPAP Pillars, strap holder, filters, tubing, and water tank.

## 2019-07-04 NOTE — Telephone Encounter (Signed)
I would be happy to write the prescription but generally Medicare requires a face to face visit with documentation of the CPAP use and I have not done that. I think he may need at least a brief appointment to directly discuss this (though we can check with the company to confirm they need the face to face when it is only supplies)

## 2019-07-05 NOTE — Telephone Encounter (Signed)
Spoke to pt. Rx up front ready for pickup 

## 2019-08-05 ENCOUNTER — Other Ambulatory Visit: Payer: Self-pay

## 2019-08-05 ENCOUNTER — Ambulatory Visit (INDEPENDENT_AMBULATORY_CARE_PROVIDER_SITE_OTHER): Payer: Medicare Other

## 2019-08-05 DIAGNOSIS — Z5181 Encounter for therapeutic drug level monitoring: Secondary | ICD-10-CM | POA: Diagnosis not present

## 2019-08-05 DIAGNOSIS — I4892 Unspecified atrial flutter: Secondary | ICD-10-CM

## 2019-08-05 LAB — POCT INR: INR: 2.6 (ref 2.0–3.0)

## 2019-08-05 NOTE — Patient Instructions (Signed)
-   Please continue warfarin dosage of 1 tablets daily except 1.5 tablet on SUNDAYS, TUESDAYS & THURSDAYS.   - Recheck in 6 weeks.   Please be consistent with your green intake - please pick a day or days each week to have your greens and have them every week on that day (your pills are on a schedule, so your greens need to be on one)

## 2019-08-12 ENCOUNTER — Ambulatory Visit: Payer: Medicare Other | Attending: Internal Medicine | Admitting: Occupational Therapy

## 2019-08-12 ENCOUNTER — Other Ambulatory Visit: Payer: Self-pay

## 2019-08-12 DIAGNOSIS — M65311 Trigger thumb, right thumb: Secondary | ICD-10-CM | POA: Insufficient documentation

## 2019-08-12 NOTE — Therapy (Signed)
[facility] [address], [place], [address] Phone: [phone]   Fax:  [phone]  Occupational Therapy Screen   Patient Details  Name: Michael Colon MRN: [id number] Date of Birth: 1930/10/23 No data recorded  Encounter Date: 08/12/2019   OT End of Session - 08/12/19 1709    Visit Number 0           Past Medical History:  Diagnosis Date  . Asthma   . Atrial flutter ([place]) 2007  . Band keratopathy   . Benign prostatic hypertrophy   . Chronic ear infection   . Chronic kidney disease   . Chronic prostatitis   . Dental bridge present    permanent - upper  . Diabetes mellitus   . Elbow stiffness, left    s/p fracture many yrs ago.  arm does not straighten  . GERD (gastroesophageal reflux disease)    laryngeal involvement  . Hyperlipidemia   . Hypertension   . Obstructive sleep apnea    CPAP-9  . Osteoarthrosis, localized, primary, knee    post-traumatic  . Prostatitis   . Stroke [facility])    TIA  . Tachy-brady syndrome ([place])   . UTI (urinary tract infection)     Past Surgical History:  Procedure Laterality Date  . APPENDECTOMY    . BELPHAROPTOSIS REPAIR     Dr [name]---didn't resolve weepy eye and eyelid drooping  . CARDIOVERSION  04/13/2010  . CATARACT EXTRACTION, BILATERAL  2009  . Chest pain  8/12   Stress test benign  . ESOPHAGEAL DILATION  05/26/2016   Procedure: ESOPHAGEAL DILATION;  Surgeon: Lucilla Lame, MD;  Location: [facility];  Service: Endoscopy;;  . ESOPHAGOGASTRODUODENOSCOPY (EGD) WITH PROPOFOL N/A 05/26/2016   Procedure: ESOPHAGOGASTRODUODENOSCOPY (EGD) WITH PROPOFOL;  Surgeon: Lucilla Lame, MD;  Location: [facility];  Service: Endoscopy;  Laterality: N/A;  Diabetic - oral meds sleep apnea  . EYE SURGERY    . FRACTURE SURGERY Left    elbow  . KNEE SURGERY  1998   plate after fracture, then removed for infection  . left elbow surgery    . MASTOIDECTOMY   8/08   Dr Idelle Crouch  . RHINOPLASTY  5/10   and septoplasty  . SUBACROMIAL DECOMPRESSION Right 2005   Arthroscopic (for rotator cuff and biceps tendon ruptures)  . TEAR DUCT PROBING  11/13   Dr Vickki Muff  . TOTAL KNEE ARTHROPLASTY Left 09/01/2014   Procedure: TOTAL KNEE ARTHROPLASTY;  Surgeon: Dereck Leep, MD;  Location: [facility];  Service: Orthopedics;  Laterality: Left;  . TRIGGER FINGER RELEASE Left 02/25/2015   Procedure: LEFT LONG TRIGGER RELEASE;  Surgeon: Dereck Leep, MD;  Location: [facility];  Service: Orthopedics;  Laterality: Left;  Marland Kitchen VENOUS ABLATION      There were no vitals filed for this visit.   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 -holding mouse in R hand  And cross word puzzle - pen or pencil in R hand  Some wood work - but not a lot  And 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 or Art gallery manager pt hold mouse with static grip - painting and increase grip to stabilize if need to do detail art the day  And discuss with pt to cut back on painting and or look at mouse size and thumb IP position Enlarge his pen or grip on pen -avoid IP static flexion  Can use band aid for sleeping and with these 2 activities Ice massage over [place] me in week  discuss  With him options for shot by Dr Candelaria Stagers or surgery   pt wife past away and he lives alone- worried about having surgery on R thumb                     OT Short Term Goals - 02/02/15 1202      OT SHORT TERM  GOAL #1   Title Pt report decrease pain at the worse less than 5/10    Baseline pain decrease - except when locking     Status Partially Met      OT SHORT TERM GOAL #2   Title Pt to be ind in HEP to modify actiivities that aggrivate and increase trigger finger     Baseline did change but still triggering    Status Partially Met             OT Long Term Goals - 02/02/15 1203      OT LONG TERM GOAL #1   Title Pt report decrease triggering of L 3rd digit by at least 50%    Baseline Several times during day and in am     Status Not Met                      Visit Diagnosis: Trigger thumb of right hand    Problem List Patient Active Problem List   Diagnosis Date Noted  . Aortic atherosclerosis ([place]) 12/28/2016  . Problems with swallowing and mastication   . Chronic venous insufficiency 04/12/2016  . Thrombocytopenia ([place]) 06/25/2015  . Obstructive sleep apnea 06/25/2015  . Advanced directives, counseling/discussion 12/13/2013  . Encounter for therapeutic drug monitoring 01/30/2013  . Routine general medical examination at a health care facility 12/11/2012  . TIA (transient ischemic attack) 12/11/2012  . Ventricular tachycardia-pause dependent 10/16/2012  . Sinus bradycardia 09/07/2012  . Mixed hyperlipidemia   . Abnormal EKG 09/26/2011  . Atrial flutter ([place]) 07/21/2010  . Encounter for anticoagulation discussion and counseling 07/21/2010  . Type 2 diabetes, controlled, with neuropathy ([place])   . Tachy-brady syndrome ([place])   . BPH with obstruction/lower urinary tract symptoms   . Unspecified asthma(493.90)   . GERD (gastroesophageal reflux disease)   . Essential hypertension     Rosalyn Gess OTR/l,CLT  08/12/2019, 5:09 PM  [facility] [address], [place], [address] Phone: [phone]   Fax:  [phone]  Name: Michael Colon MRN: [id number] Date of Birth: [birth year]

## 2019-08-19 ENCOUNTER — Ambulatory Visit: Payer: Medicare Other | Admitting: Internal Medicine

## 2019-08-20 ENCOUNTER — Ambulatory Visit (INDEPENDENT_AMBULATORY_CARE_PROVIDER_SITE_OTHER): Payer: Medicare Other | Admitting: Internal Medicine

## 2019-08-20 ENCOUNTER — Other Ambulatory Visit: Payer: Self-pay

## 2019-08-20 ENCOUNTER — Encounter: Payer: Self-pay | Admitting: Internal Medicine

## 2019-08-20 VITALS — BP 132/72 | HR 58 | Temp 97.4°F | Ht 70.0 in | Wt 219.0 lb

## 2019-08-20 DIAGNOSIS — L97201 Non-pressure chronic ulcer of unspecified calf limited to breakdown of skin: Secondary | ICD-10-CM

## 2019-08-20 DIAGNOSIS — E1140 Type 2 diabetes mellitus with diabetic neuropathy, unspecified: Secondary | ICD-10-CM

## 2019-08-20 DIAGNOSIS — I4892 Unspecified atrial flutter: Secondary | ICD-10-CM | POA: Diagnosis not present

## 2019-08-20 DIAGNOSIS — I10 Essential (primary) hypertension: Secondary | ICD-10-CM | POA: Diagnosis not present

## 2019-08-20 DIAGNOSIS — I83002 Varicose veins of unspecified lower extremity with ulcer of calf: Secondary | ICD-10-CM | POA: Diagnosis not present

## 2019-08-20 DIAGNOSIS — L97909 Non-pressure chronic ulcer of unspecified part of unspecified lower leg with unspecified severity: Secondary | ICD-10-CM | POA: Insufficient documentation

## 2019-08-20 DIAGNOSIS — I83009 Varicose veins of unspecified lower extremity with ulcer of unspecified site: Secondary | ICD-10-CM | POA: Insufficient documentation

## 2019-08-20 LAB — POCT GLYCOSYLATED HEMOGLOBIN (HGB A1C): Hemoglobin A1C: 6.3 % — AB (ref 4.0–5.6)

## 2019-08-20 NOTE — Assessment & Plan Note (Signed)
No evidence of recurrence  Is on the coumadin

## 2019-08-20 NOTE — Assessment & Plan Note (Signed)
BP Readings from Last 3 Encounters:  08/20/19 132/72  02/15/19 118/74  10/15/18 (!) 130/56   Good control on diuretic

## 2019-08-20 NOTE — Assessment & Plan Note (Signed)
Superficial Will go back to see Dr Lucky Cowboy

## 2019-08-20 NOTE — Patient Instructions (Signed)
Please stop the glipizide

## 2019-08-20 NOTE — Assessment & Plan Note (Signed)
Lab Results  Component Value Date   HGBA1C 6.3 (A) 08/20/2019   Excellent control  Will stop the glipizide due to risk of hypoglycemia Continue the metformin

## 2019-08-20 NOTE — Progress Notes (Signed)
Subjective:    Patient ID: Michael Colon, male    DOB: 28-Sep-1930, 87 y.o.   MRN: [id number]  HPI Here for follow up of diabetes and other chronic health conditions This visit occurred during the SARS-CoV-2 public health emergency.  Safety protocols were in place, including screening questions prior to the visit, additional usage of staff PPE, and extensive cleaning of exam room while observing appropriate contact time as indicated for disinfecting solutions.   Still adjusting to being a widow--had been married for 55 years He does all the housework now, etc Mostly gets prepared foods-- cafeteria, etc  Continues to check sugars every 10 days or so Sugars 70's and 80's mostly Eating less and better No leg pain now--but some swelling and superficial ulcers (CVI)  Has lost 25# gradually (3 years) No hypoglycemic reactions Vinegar pills and B12,have helped his energy, etc  Monitors BP 144/86  Pulse in the 40's---no change No dizziness or syncope Getting in 10K steps most days No clear spells with atrial flutter  Current Outpatient Medications on File Prior to Visit  Medication Sig Dispense Refill  . APPLE CIDER VINEGAR PO Take by mouth.    Marland Kitchen atorvastatin (LIPITOR) 80 MG tablet Take 1 tablet (80 mg total) by mouth daily. 90 tablet 3  . cetirizine (ZYRTEC) 10 MG tablet Take 10 mg by mouth daily.    . Cyanocobalamin (B-12) 2500 MCG SUBL Place under the tongue.    . docusate sodium (COLACE) 100 MG capsule Take 100 mg by mouth 2 (two) times daily.     . famotidine (PEPCID) 20 MG tablet Take 20 mg by mouth daily.     . finasteride (PROSCAR) 5 MG tablet Take 1 tablet by mouth once daily 90 tablet 3  . glipiZIDE (GLUCOTROL XL) 5 MG 24 hr tablet Take 1 tablet by mouth once daily with breakfast 90 tablet 2  . glucose blood (BAYER CONTOUR TEST) test strip USE STRIP TO TEST BLOOD SUGAR ONCE DAILY. Dx Code: E11.40 100 each 3  . ibuprofen (ADVIL,MOTRIN) 200 MG tablet Take 400 mg by mouth 2  (two) times daily.     Marland Kitchen loratadine (CLARITIN) 10 MG tablet Take 10 mg by mouth daily.      . metFORMIN (GLUCOPHAGE) 500 MG tablet TAKE 1 TABLET BY MOUTH TWICE DAILY WITH MEALS 180 tablet 3  . Microlet Lancets MISC Use to test twice daily or as directed 200 each 3  . tamsulosin (FLOMAX) 0.4 MG CAPS capsule Take 1 capsule (0.4 mg total) by mouth daily. 90 capsule 2  . triamterene-hydrochlorothiazide (DYAZIDE) 37.5-25 MG capsule Take 1 capsule by mouth in the morning 90 capsule 3  . warfarin (COUMADIN) 5 MG tablet TAKE 1 TO 2 BY MOUTH AS  DIRECTED BY COUMADIN CLINIC 135 tablet 1   No current facility-administered medications on file prior to visit.    Allergies  Allergen Reactions  . Enoxaparin Hives  . Enoxaparin Sodium Hives  . Latex Rash    Has trouble with BANDAIDS that have been left on for more than 24 hours. Prefers paper tape.  . Nickel Rash  . Percocet [Oxycodone-Acetaminophen] Rash  . Tape Rash and Other (See Comments)    Sensitivity     Past Medical History:  Diagnosis Date  . Asthma   . Atrial flutter ([place]) 2007  . Band keratopathy   . Benign prostatic hypertrophy   . Chronic ear infection   . Chronic kidney disease   . Chronic prostatitis   .  Dental bridge present    permanent - upper  . Diabetes mellitus   . Elbow stiffness, left    s/p fracture many yrs ago.  arm does not straighten  . GERD (gastroesophageal reflux disease)    laryngeal involvement  . Hyperlipidemia   . Hypertension   . Obstructive sleep apnea    CPAP-9  . Osteoarthrosis, localized, primary, knee    post-traumatic  . Prostatitis   . Stroke [facility])    TIA  . Tachy-brady syndrome ([place])   . UTI (urinary tract infection)     Past Surgical History:  Procedure Laterality Date  . APPENDECTOMY    . BELPHAROPTOSIS REPAIR     Dr [name]---didn't resolve weepy eye and eyelid drooping  . CARDIOVERSION  04/13/2010  . CATARACT EXTRACTION, BILATERAL  2009  . Chest pain  8/12   Stress test benign    . ESOPHAGEAL DILATION  05/26/2016   Procedure: ESOPHAGEAL DILATION;  Surgeon: Lucilla Lame, MD;  Location: [facility];  Service: Endoscopy;;  . ESOPHAGOGASTRODUODENOSCOPY (EGD) WITH PROPOFOL N/A 05/26/2016   Procedure: ESOPHAGOGASTRODUODENOSCOPY (EGD) WITH PROPOFOL;  Surgeon: Lucilla Lame, MD;  Location: [facility];  Service: Endoscopy;  Laterality: N/A;  Diabetic - oral meds sleep apnea  . EYE SURGERY    . FRACTURE SURGERY Left    elbow  . KNEE SURGERY  1998   plate after fracture, then removed for infection  . left elbow surgery    . MASTOIDECTOMY  8/08   Dr Idelle Crouch  . RHINOPLASTY  5/10   and septoplasty  . SUBACROMIAL DECOMPRESSION Right 2005   Arthroscopic (for rotator cuff and biceps tendon ruptures)  . TEAR DUCT PROBING  11/13   Dr Vickki Muff  . TOTAL KNEE ARTHROPLASTY Left 09/01/2014   Procedure: TOTAL KNEE ARTHROPLASTY;  Surgeon: Dereck Leep, MD;  Location: [facility];  Service: Orthopedics;  Laterality: Left;  . TRIGGER FINGER RELEASE Left 02/25/2015   Procedure: LEFT LONG TRIGGER RELEASE;  Surgeon: Dereck Leep, MD;  Location: [facility];  Service: Orthopedics;  Laterality: Left;  Marland Kitchen VENOUS ABLATION      Family History  Problem Relation Age of Onset  . Colon cancer Father   . Diabetes Father   . Hypertension Father   . Other Mother        natural causes  . Heart attack Neg Hx   . Stroke Neg Hx     Social History   Socioeconomic History  . Marital status: Married    Spouse name: Not on file  . Number of children: 2  . Years of education: Not on file  . Highest education level: Not on file  Occupational History  . Occupation: Theme park manager    Comment: Retired--[employer]  Tobacco Use  . Smoking status: Former Smoker    Packs/day: 1.00    Years: 0.00    Pack years: 0.00    Types: Cigarettes    Quit date: 01/03/1969    Years since quitting: 50.6  . Smokeless tobacco: Never Used  Substance and Sexual Activity  . Alcohol use: No    Alcohol/week: 1.0  standard drink    Types: 1 Glasses of wine per week    Comment: rare wine. 1-2x/yr.  . Drug use: No  . Sexual activity: Not on file  Other Topics Concern  . Not on file  Social History Narrative   Has living will   Health care POA-- son Shanon Brow   Has DNR order from the past---form redone 06/25/10  Probably no feeding tube if cognitively unaware   Social Determinants of Health   Financial Resource Strain:   . Difficulty of Paying Living Expenses:   Food Insecurity:   . Worried About Charity fundraiser in the Last Year:   . Arboriculturist in the Last Year:   Transportation Needs:   . Film/video editor (Medical):   Marland Kitchen Lack of Transportation (Non-Medical):   Physical Activity:   . Days of Exercise per Week:   . Minutes of Exercise per Session:   Stress:   . Feeling of Stress :   Social Connections:   . Frequency of Communication with Friends and Family:   . Frequency of Social Gatherings with Friends and Family:   . Attends Religious Services:   . Active Member of Clubs or Organizations:   . Attends Archivist Meetings:   Marland Kitchen Marital Status:   Intimate Partner Violence:   . Fear of Current or Ex-Partner:   . Emotionally Abused:   Marland Kitchen Physically Abused:   . Sexually Abused:    Review of Systems  Sleep is variable---will take 15 minute power nap in day (trouble sleeping if naps for longer) Some nocturia--stable     Objective:   Physical Exam Constitutional:      Appearance: Normal appearance.  Cardiovascular:     Rate and Rhythm: Normal rate and regular rhythm.     Pulses: Normal pulses.     Heart sounds: No murmur heard.  No gallop.   Pulmonary:     Effort: Pulmonary effort is normal.     Breath sounds: Normal breath sounds. No wheezing or rales.  Musculoskeletal:     Cervical back: Neck supple.     Comments: 1+ edema in calves  Lymphadenopathy:     Cervical: No cervical adenopathy.  Skin:    Comments: Superficial ulcerations on lower left calf--no  inflammation  Neurological:     Mental Status: He is alert.  Psychiatric:        Mood and Affect: Mood normal.        Behavior: Behavior normal.            Assessment & Plan:

## 2019-08-29 ENCOUNTER — Telehealth: Payer: Self-pay

## 2019-08-29 NOTE — Telephone Encounter (Signed)
Pt left v/m; pt forgot to ask at 08/20/19 appt if pt should take regular or high dose flu shot this year. Pt had moderna on 01/18/19 and 02/15/19.

## 2019-08-29 NOTE — Telephone Encounter (Signed)
I would recommend the high dose (as well as a COVID booster)

## 2019-08-29 NOTE — Telephone Encounter (Signed)
Mr. Morozov notified as instructed by telephone.  Patient states understanding.

## 2019-09-02 DIAGNOSIS — I83022 Varicose veins of left lower extremity with ulcer of calf: Secondary | ICD-10-CM | POA: Diagnosis not present

## 2019-09-02 DIAGNOSIS — L97221 Non-pressure chronic ulcer of left calf limited to breakdown of skin: Secondary | ICD-10-CM | POA: Diagnosis not present

## 2019-09-02 DIAGNOSIS — L851 Acquired keratosis [keratoderma] palmaris et plantaris: Secondary | ICD-10-CM | POA: Diagnosis not present

## 2019-09-02 DIAGNOSIS — E1142 Type 2 diabetes mellitus with diabetic polyneuropathy: Secondary | ICD-10-CM | POA: Diagnosis not present

## 2019-09-02 DIAGNOSIS — B351 Tinea unguium: Secondary | ICD-10-CM | POA: Diagnosis not present

## 2019-09-05 ENCOUNTER — Other Ambulatory Visit: Payer: Self-pay

## 2019-09-05 ENCOUNTER — Ambulatory Visit (INDEPENDENT_AMBULATORY_CARE_PROVIDER_SITE_OTHER): Payer: Medicare Other | Admitting: Nurse Practitioner

## 2019-09-05 ENCOUNTER — Encounter (INDEPENDENT_AMBULATORY_CARE_PROVIDER_SITE_OTHER): Payer: Self-pay | Admitting: Nurse Practitioner

## 2019-09-05 VITALS — BP 169/95 | HR 52 | Resp 16 | Wt 239.0 lb

## 2019-09-05 DIAGNOSIS — I890 Lymphedema, not elsewhere classified: Secondary | ICD-10-CM | POA: Diagnosis not present

## 2019-09-05 DIAGNOSIS — I10 Essential (primary) hypertension: Secondary | ICD-10-CM

## 2019-09-05 DIAGNOSIS — L97201 Non-pressure chronic ulcer of unspecified calf limited to breakdown of skin: Secondary | ICD-10-CM

## 2019-09-05 DIAGNOSIS — I83002 Varicose veins of unspecified lower extremity with ulcer of calf: Secondary | ICD-10-CM | POA: Diagnosis not present

## 2019-09-09 ENCOUNTER — Encounter (INDEPENDENT_AMBULATORY_CARE_PROVIDER_SITE_OTHER): Payer: Self-pay | Admitting: Nurse Practitioner

## 2019-09-09 NOTE — Progress Notes (Signed)
Subjective:    Patient ID: Michael Colon, male    DOB: 03/16/1930, 86 y.o.   MRN: [id number] Chief Complaint  Patient presents with  . Follow-up    ref [place] lymphedema    The patient presents today for evaluation of lower extremity edema.  The patient was previously seen in 2018.  The patient previously had endovenous laser ablation for treatment of varicose veins in the right lower extremity.  The patient had the veins in his left lower extremity stripped.  The patient has also had a number of injuries and surgeries to the left lower extremity.  The patient notes that his swelling was doing generally well until he stopped his glipizide.  The patient notes that this was stopped by his primary care physician due to low blood glucose levels.  He notes that shortly after stopping this medication he began to have swelling bilaterally extending from the feet to above the knees.  The patient previously had more ulcerations that are present today, however he was wrapped at his podiatrist visit who initially noticed ulcerations.  Today there are only small ulcerations left in place.  He denies any fever, chills, nausea, vomiting or diarrhea.  He denies any signs symptoms of cellulitis.  Prior to the ulceration the patient generally did wear medical grade 1 compression stockings on a regular basis as well.   Review of Systems  Cardiovascular: Positive for leg swelling.  Skin: Positive for wound.  All other systems reviewed and are negative.      Objective:   Physical Exam Vitals reviewed.  HENT:     Head: Normocephalic.  Cardiovascular:     Rate and Rhythm: Normal rate and regular rhythm.     Pulses: Normal pulses.  Pulmonary:     Effort: Pulmonary effort is normal.  Musculoskeletal:     Right lower leg: 2+ Pitting Edema present.     Left lower leg: 2+ Pitting Edema (Small ulcerations) present.  Skin:    General: Skin is warm and dry.  Neurological:     Mental Status: He is alert and  oriented to person, place, and time.  Psychiatric:        Mood and Affect: Mood normal.        Behavior: Behavior normal.        Thought Content: Thought content normal.        Judgment: Judgment normal.     BP (!) 169/95 (BP Location: Left Arm)   Pulse (!) 52   Resp 16   Wt 239 lb (108.4 kg)   BMI 34.29 kg/m   Past Medical History:  Diagnosis Date  . Asthma   . Atrial flutter ([place]) 2007  . Band keratopathy   . Benign prostatic hypertrophy   . Chronic ear infection   . Chronic kidney disease   . Chronic prostatitis   . Dental bridge present    permanent - upper  . Diabetes mellitus   . Elbow stiffness, left    s/p fracture many yrs ago.  arm does not straighten  . GERD (gastroesophageal reflux disease)    laryngeal involvement  . Hyperlipidemia   . Hypertension   . Obstructive sleep apnea    CPAP-9  . Osteoarthrosis, localized, primary, knee    post-traumatic  . Prostatitis   . Stroke [facility])    TIA  . Tachy-brady syndrome ([place])   . UTI (urinary tract infection)     Social History   Socioeconomic History  . Marital  status: Married    Spouse name: Not on file  . Number of children: 2  . Years of education: Not on file  . Highest education level: Not on file  Occupational History  . Occupation: Theme park manager    Comment: Retired--[employer]  Tobacco Use  . Smoking status: Former Smoker    Packs/day: 1.00    Years: 0.00    Pack years: 0.00    Types: Cigarettes    Quit date: 01/03/1969    Years since quitting: 50.7  . Smokeless tobacco: Never Used  Substance and Sexual Activity  . Alcohol use: No    Alcohol/week: 1.0 standard drink    Types: 1 Glasses of wine per week    Comment: rare wine. 1-2x/yr.  . Drug use: No  . Sexual activity: Not on file  Other Topics Concern  . Not on file  Social History Narrative   Has living will   Health care POA-- son Shanon Brow   Has DNR order from the past---form redone 06/25/10   Probably no feeding tube if cognitively unaware     Social Determinants of Health   Financial Resource Strain:   . Difficulty of Paying Living Expenses: Not on file  Food Insecurity:   . Worried About Charity fundraiser in the Last Year: Not on file  . Ran Out of Food in the Last Year: Not on file  Transportation Needs:   . Lack of Transportation (Medical): Not on file  . Lack of Transportation (Non-Medical): Not on file  Physical Activity:   . Days of Exercise per Week: Not on file  . Minutes of Exercise per Session: Not on file  Stress:   . Feeling of Stress : Not on file  Social Connections:   . Frequency of Communication with Friends and Family: Not on file  . Frequency of Social Gatherings with Friends and Family: Not on file  . Attends Religious Services: Not on file  . Active Member of Clubs or Organizations: Not on file  . Attends Archivist Meetings: Not on file  . Marital Status: Not on file  Intimate Partner Violence:   . Fear of Current or Ex-Partner: Not on file  . Emotionally Abused: Not on file  . Physically Abused: Not on file  . Sexually Abused: Not on file    Past Surgical History:  Procedure Laterality Date  . APPENDECTOMY    . BELPHAROPTOSIS REPAIR     Dr [name]---didn't resolve weepy eye and eyelid drooping  . CARDIOVERSION  04/13/2010  . CATARACT EXTRACTION, BILATERAL  2009  . Chest pain  8/12   Stress test benign  . ESOPHAGEAL DILATION  05/26/2016   Procedure: ESOPHAGEAL DILATION;  Surgeon: Lucilla Lame, MD;  Location: [place];  Service: Endoscopy;;  . ESOPHAGOGASTRODUODENOSCOPY (EGD) WITH PROPOFOL N/A 05/26/2016   Procedure: ESOPHAGOGASTRODUODENOSCOPY (EGD) WITH PROPOFOL;  Surgeon: Lucilla Lame, MD;  Location: [place];  Service: Endoscopy;  Laterality: N/A;  Diabetic - oral meds sleep apnea  . EYE SURGERY    . FRACTURE SURGERY Left    elbow  . KNEE SURGERY  1998   plate after fracture, then removed for infection  . left elbow surgery    . MASTOIDECTOMY  8/08    Dr Idelle Crouch  . RHINOPLASTY  5/10   and septoplasty  . SUBACROMIAL DECOMPRESSION Right 2005   Arthroscopic (for rotator cuff and biceps tendon ruptures)  . TEAR DUCT PROBING  11/13   Dr Vickki Muff  . TOTAL  KNEE ARTHROPLASTY Left 09/01/2014   Procedure: TOTAL KNEE ARTHROPLASTY;  Surgeon: Dereck Leep, MD;  Location: [facility];  Service: Orthopedics;  Laterality: Left;  . TRIGGER FINGER RELEASE Left 02/25/2015   Procedure: LEFT LONG TRIGGER RELEASE;  Surgeon: Dereck Leep, MD;  Location: [facility];  Service: Orthopedics;  Laterality: Left;  Marland Kitchen VENOUS ABLATION      Family History  Problem Relation Age of Onset  . Colon cancer Father   . Diabetes Father   . Hypertension Father   . Other Mother        natural causes  . Heart attack Neg Hx   . Stroke Neg Hx     Allergies  Allergen Reactions  . Enoxaparin Hives  . Enoxaparin Sodium Hives  . Latex Rash    Has trouble with BANDAIDS that have been left on for more than 24 hours. Prefers paper tape.  . Nickel Rash  . Percocet [Oxycodone-Acetaminophen] Rash  . Tape Rash and Other (See Comments)    Sensitivity        Assessment & Plan:   1. Venous stasis ulcer of calf limited to breakdown of skin, unspecified laterality, unspecified whether varicose veins present [facility]) We will place the patient in bilateral Unna wraps today.  These wraps to be changed on a weekly basis in our office.  We will continue this for the next 3 weeks.  The patient is advised to continue with elevation and exercise as tolerable.  There is question as to whether the cessation of glipizide has caused this exacerbation.  Or if it is more so coincidental in nature.  We will have the patient return to the office in approximately 3 weeks for review of lower extremity edema.  We will also have a bilateral venous reflux done to determine if there is any residual deep venous insufficiency seen bilaterally.  2. Essential hypertension Continue antihypertensive medications  as already ordered, these medications have been reviewed and there are no changes at this time.  3. Lymphedema No surgery or intervention at this point in time.    I have reviewed my discussion with the patient regarding venous insufficiency and secondary lymph edema and why it  causes symptoms. I have discussed with the patient the chronic skin changes that accompany these problems and the long term sequela such as ulceration and infection.  Currently patient will be placed in bilateral Unna wraps to gain control the swelling however he will transition back to medical grade 1 compression stockings once the swelling is under control..  In addition, behavioral modification including elevation during the day will be continued.  Diet and salt restriction was also discussed.  Depending on progression of swelling, lymphedema pump may be discussed at follow-up office visit.    Current Outpatient Medications on File Prior to Visit  Medication Sig Dispense Refill  . APPLE CIDER VINEGAR PO Take by mouth.    Marland Kitchen atorvastatin (LIPITOR) 80 MG tablet Take 1 tablet (80 mg total) by mouth daily. 90 tablet 3  . cetirizine (ZYRTEC) 10 MG tablet Take 10 mg by mouth daily.    . Cyanocobalamin (B-12) 2500 MCG SUBL Place under the tongue.    . docusate sodium (COLACE) 100 MG capsule Take 100 mg by mouth 2 (two) times daily.     . famotidine (PEPCID) 20 MG tablet Take 20 mg by mouth daily.     . finasteride (PROSCAR) 5 MG tablet Take 1 tablet by mouth once daily 90 tablet 3  .  glucose blood (BAYER CONTOUR TEST) test strip USE STRIP TO TEST BLOOD SUGAR ONCE DAILY. Dx Code: E11.40 100 each 3  . ibuprofen (ADVIL,MOTRIN) 200 MG tablet Take 400 mg by mouth 2 (two) times daily.     Marland Kitchen loratadine (CLARITIN) 10 MG tablet Take 10 mg by mouth daily.      . metFORMIN (GLUCOPHAGE) 500 MG tablet TAKE 1 TABLET BY MOUTH TWICE DAILY WITH MEALS 180 tablet 3  . Microlet Lancets MISC Use to test twice daily or as directed 200 each 3   . tamsulosin (FLOMAX) 0.4 MG CAPS capsule Take 1 capsule (0.4 mg total) by mouth daily. 90 capsule 2  . triamterene-hydrochlorothiazide (DYAZIDE) 37.5-25 MG capsule Take 1 capsule by mouth in the morning 90 capsule 3  . warfarin (COUMADIN) 5 MG tablet TAKE 1 TO 2 BY MOUTH AS  DIRECTED BY COUMADIN CLINIC 135 tablet 1   No current facility-administered medications on file prior to visit.    There are no Patient Instructions on file for this visit. No follow-ups on file.   Kris Hartmann, NP

## 2019-09-11 DIAGNOSIS — Z23 Encounter for immunization: Secondary | ICD-10-CM | POA: Diagnosis not present

## 2019-09-12 ENCOUNTER — Other Ambulatory Visit: Payer: Self-pay

## 2019-09-12 ENCOUNTER — Encounter (INDEPENDENT_AMBULATORY_CARE_PROVIDER_SITE_OTHER): Payer: Self-pay

## 2019-09-12 ENCOUNTER — Ambulatory Visit (INDEPENDENT_AMBULATORY_CARE_PROVIDER_SITE_OTHER): Payer: Medicare Other | Admitting: Nurse Practitioner

## 2019-09-12 VITALS — BP 180/73 | HR 60 | Resp 16 | Wt 243.0 lb

## 2019-09-12 DIAGNOSIS — L97201 Non-pressure chronic ulcer of unspecified calf limited to breakdown of skin: Secondary | ICD-10-CM

## 2019-09-12 DIAGNOSIS — I83002 Varicose veins of unspecified lower extremity with ulcer of calf: Secondary | ICD-10-CM | POA: Diagnosis not present

## 2019-09-12 NOTE — Progress Notes (Signed)
History of Present Illness  There is no documented history at this time  Assessments & Plan   There are no diagnoses linked to this encounter.    Additional instructions  Subjective:  Patient presents with venous ulcer of the Bilateral lower extremity.    Procedure:  3 layer unna wrap was placed Bilateral lower extremity.   Plan:   Follow up in one week.  

## 2019-09-13 ENCOUNTER — Telehealth: Payer: Self-pay

## 2019-09-13 NOTE — Telephone Encounter (Signed)
Noted, thank you Dr. Silvio Pate.  We do have patient scheduled for Monday.

## 2019-09-13 NOTE — Telephone Encounter (Signed)
[facility] - Client TELEPHONE ADVICE RECORD [facility] Patient Name: Michael Colon Gender: Male DOB: [birth year] Age: 86 Y 10 M Return Phone Number: [phone] (Primary) Address: City/State/Zip: [place] Client [facility] - Client Client Site [place] - Day Physician [name]- MD Contact Type Call Who Is Calling Patient / Member / Family / Caregiver Call Type Triage / Clinical Relationship To Patient Self Return Phone Number [phone] (Primary) Chief Complaint Weight Gain Reason for Call Symptomatic / Request for [place] reports he has gained 24 lbs in the past week of fluid. Translation No Nurse Assessment Nurse: Loreen Freud, RN, [place] Date/Time (Eastern Time): 09/13/2019 12:09:58 PM Confirm and document reason for call. If symptomatic, describe symptoms. ---Caller states he has gained 24 lbs of fluid in 2 days. He was recently taken off of Glipizide. He has 3-4 ulcers on his leg, his leg was swollen. Has the patient had close contact with a person known or suspected to have the novel coronavirus illness OR traveled / lives in area with major community spread (including international travel) in the last 14 days from the onset of symptoms? * If Asymptomatic, screen for exposure and travel within the last 14 days. ---No Does the patient have any new or worsening symptoms? ---Yes Will a triage be completed? ---Yes Related visit to physician within the last 2 weeks? ---No Does the PT have any chronic conditions? (i.e. diabetes, asthma, this includes High risk factors for pregnancy, etc.) ---Yes List chronic conditions. ---Diabetes, atrial flutter, sleep apnea, hypertension Is this a behavioral health or substance abuse call? ---No Guidelines Guideline Title Affirmed Question Affirmed Notes Nurse Date/Time Eilene Ghazi Time) Leg Swelling and Edema SEVERE leg  swelling (e.g., swelling extends above knee, entire leg is swollen, weeping fluid) [place], RN, [place] 09/13/2019 12:17:43 PM Disp. Time Eilene Ghazi Time) Disposition Final User PLEASE NOTE: All timestamps contained within this report are represented as [place] Standard Time. CONFIDENTIALTY NOTICE: This fax transmission is intended only for the addressee. It contains information that is legally privileged, confidential or otherwise protected from use or disclosure. If you are not the intended recipient, you are strictly prohibited from reviewing, disclosing, copying using or disseminating any of this information or taking any action in reliance on or regarding this information. If you have received this fax in error, please notify us immediately by telephone so that we can arrange for its return to [place]. Phone: [phone], Toll-Free: [phone], Fax: [phone] Page: 2 of 2 Call Id: [id number] 09/13/2019 12:27:20 PM See HCP within 4 Hours (or PCP triage) Yes [place], RN, [place] Disagree/Comply Disagree Caller Understands Yes PreDisposition Did not know what to do Care Advice Given Per Guideline SEE HCP (OR PCP TRIAGE) WITHIN 4 HOURS: * IF OFFICE WILL BE CLOSED AND NO PCP (PRIMARY CARE PROVIDER) SECOND-LEVEL TRIAGE: You need to be seen within the next 3 or 4 hours. A nearby Urgent Care Center [facility]) is often a good source of care. Another choice is to go to the ED. Go sooner if you become worse. CALL BACK IF: * You become worse CARE ADVICE given per Leg Swelling and Edema (Adult) guideline. Comments User: Willette Brace, RN Date/Time Eilene Ghazi Time): 09/13/2019 12:31:51 PM No appointments available at the office today. Caller states he will make an appointment for Monday. Caller verbalized understanding that RN recommends seeing a HCP within 3-4 hours. Referrals REFERRED TO PCP OFFICE

## 2019-09-13 NOTE — Telephone Encounter (Signed)
I think waiting till Monday is probably fine. This has been going on for a while and he has seen vascular

## 2019-09-13 NOTE — Telephone Encounter (Signed)
Pt was triaged by access nurse (telephone record from access nurse is not in portal yet). Pt said since first of Sept he has had weight gain of 24 lbs and most of the weight came on within a 24 hour period about 8 - 10 days ago.pt does not have dates or exact wts in front of pt; pt said he has been seeing vascular doctor on 09/05/19 and 09/12/19 and getting double wraps on his legs with unna boots due to ulcers that are healing but pt is having a lot of swelling in legs and pt has noticed swelling in face, hands and a 2" increase in swelling in abd. Pt said he has no CP but does have SOB upon exertion; no SOB sitting. Pt is keeping legs elevated when sitting.pt was advised by access nurse to be seen in next 4 hrs and no available appt s at [facility] this afternoon . Pt only wants to see Dr Silvio Pate and he said he feels pretty good and has evaluated his situation and feels appt with Dr Silvio Pate on Monday is the right thing to do. Pt refuses UC. Pt appreciated my concern but he knows when wt gain is serious and when he needs to go to ED. Pt said that he has been taking diuretic but the glipizide had been stopped and pt knows that [facility] has done research on glipizide helping swelling in women but inconclusive if helping men. Pt asked me to speak with mgr because he only wants to schedule appt with Dr Silvio Pate for 09/16/19. I spoke with Leafy Ro RN and she said to advise pt she recommends pt should go to hospital for evaluation is our best advise but if he refuses then schedule appt with Dr Silvio Pate on 09/16/19 with UC & ED precautions prior to appt. Mandy  RN will speak with Dr Silvio Pate and if different outcome Leafy Ro RN will contact pt. Pt voiced  Understanding and scheduled 30' appt on 09/16/19 at 3pm with Dr Silvio Pate. Pt did say if he felt over the weekend his condition were to worsen where he needs to be seen before appt on 09/16/19 pt will go to ED. Sending note to Dr Silvio Pate and Leafy Ro RN.

## 2019-09-16 ENCOUNTER — Other Ambulatory Visit: Payer: Self-pay

## 2019-09-16 ENCOUNTER — Encounter (INDEPENDENT_AMBULATORY_CARE_PROVIDER_SITE_OTHER): Payer: Self-pay | Admitting: Nurse Practitioner

## 2019-09-16 ENCOUNTER — Ambulatory Visit (INDEPENDENT_AMBULATORY_CARE_PROVIDER_SITE_OTHER): Payer: Medicare Other | Admitting: Internal Medicine

## 2019-09-16 ENCOUNTER — Encounter: Payer: Self-pay | Admitting: Internal Medicine

## 2019-09-16 ENCOUNTER — Ambulatory Visit (INDEPENDENT_AMBULATORY_CARE_PROVIDER_SITE_OTHER)
Admission: RE | Admit: 2019-09-16 | Discharge: 2019-09-16 | Disposition: A | Discharge status: Home / Self Care | Payer: Medicare Other | Source: Ambulatory Visit | Attending: Internal Medicine | Admitting: Internal Medicine

## 2019-09-16 ENCOUNTER — Ambulatory Visit (INDEPENDENT_AMBULATORY_CARE_PROVIDER_SITE_OTHER): Payer: Medicare Other

## 2019-09-16 DIAGNOSIS — R0602 Shortness of breath: Secondary | ICD-10-CM | POA: Diagnosis not present

## 2019-09-16 DIAGNOSIS — R601 Generalized edema: Secondary | ICD-10-CM | POA: Diagnosis not present

## 2019-09-16 DIAGNOSIS — J90 Pleural effusion, not elsewhere classified: Secondary | ICD-10-CM | POA: Diagnosis not present

## 2019-09-16 DIAGNOSIS — I4892 Unspecified atrial flutter: Secondary | ICD-10-CM

## 2019-09-16 DIAGNOSIS — Z5181 Encounter for therapeutic drug level monitoring: Secondary | ICD-10-CM

## 2019-09-16 DIAGNOSIS — I10 Essential (primary) hypertension: Secondary | ICD-10-CM

## 2019-09-16 DIAGNOSIS — H18423 Band keratopathy, bilateral: Secondary | ICD-10-CM | POA: Diagnosis not present

## 2019-09-16 DIAGNOSIS — J9811 Atelectasis: Secondary | ICD-10-CM | POA: Diagnosis not present

## 2019-09-16 LAB — POCT INR: INR: 3.8 — AB (ref 2.0–3.0)

## 2019-09-16 MED ORDER — FUROSEMIDE 40 MG PO TABS: 40.0000 mg | ORAL_TABLET | Freq: Every day | ORAL | 3 refills | Status: DC

## 2019-09-16 NOTE — Assessment & Plan Note (Signed)
BP Readings from Last 3 Encounters:  09/16/19 128/74  09/12/19 (!) 180/73  09/05/19 (!) 169/95   Good control now On the triamterene/HCTZ

## 2019-09-16 NOTE — Assessment & Plan Note (Signed)
Sudden worsening with weight gain Some SOB PE consistent with pleural effusion---will check labs and CXR

## 2019-09-16 NOTE — Progress Notes (Signed)
Subjective:    Patient ID: Michael Colon, male    DOB: 09/19/1930, 86 y.o.   MRN: [id number]  HPI Here due to weight gain and edema This visit occurred during the SARS-CoV-2 public health emergency.  Safety protocols were in place, including screening questions prior to the visit, additional usage of staff PPE, and extensive cleaning of exam room while observing appropriate contact time as indicated for disinfecting solutions.   He went to Dr Elvina Mattes after my last visit Legs were wrapped Then he saw Dr Bunnie Domino office----both legs were wrapped Redone the next week  Had INR recently--up to 3.8  Feels he is puffy in face in hands Some SOB---with minimal exertion  Current Outpatient Medications on File Prior to Visit  Medication Sig Dispense Refill  . APPLE CIDER VINEGAR PO Take by mouth.    Marland Kitchen atorvastatin (LIPITOR) 80 MG tablet Take 1 tablet (80 mg total) by mouth daily. 90 tablet 3  . cetirizine (ZYRTEC) 10 MG tablet Take 10 mg by mouth daily.    . Cyanocobalamin (B-12) 2500 MCG SUBL Place under the tongue.    . docusate sodium (COLACE) 100 MG capsule Take 100 mg by mouth 2 (two) times daily.     . famotidine (PEPCID) 20 MG tablet Take 20 mg by mouth daily.     . finasteride (PROSCAR) 5 MG tablet Take 1 tablet by mouth once daily 90 tablet 3  . glucose blood (BAYER CONTOUR TEST) test strip USE STRIP TO TEST BLOOD SUGAR ONCE DAILY. Dx Code: E11.40 100 each 3  . ibuprofen (ADVIL,MOTRIN) 200 MG tablet Take 400 mg by mouth 2 (two) times daily.     Marland Kitchen loratadine (CLARITIN) 10 MG tablet Take 10 mg by mouth daily.      . metFORMIN (GLUCOPHAGE) 500 MG tablet TAKE 1 TABLET BY MOUTH TWICE DAILY WITH MEALS 180 tablet 3  . Microlet Lancets MISC Use to test twice daily or as directed 200 each 3  . tamsulosin (FLOMAX) 0.4 MG CAPS capsule Take 1 capsule (0.4 mg total) by mouth daily. 90 capsule 2  . triamterene-hydrochlorothiazide (DYAZIDE) 37.5-25 MG capsule Take 1 capsule by mouth in the morning  90 capsule 3  . warfarin (COUMADIN) 5 MG tablet TAKE 1 TO 2 BY MOUTH AS  DIRECTED BY COUMADIN CLINIC 135 tablet 1   No current facility-administered medications on file prior to visit.    Allergies  Allergen Reactions  . Enoxaparin Hives  . Enoxaparin Sodium Hives  . Latex Rash    Has trouble with BANDAIDS that have been left on for more than 24 hours. Prefers paper tape.  . Nickel Rash  . Percocet [Oxycodone-Acetaminophen] Rash  . Tape Rash and Other (See Comments)    Sensitivity     Past Medical History:  Diagnosis Date  . Asthma   . Atrial flutter ([place]) 2007  . Band keratopathy   . Benign prostatic hypertrophy   . Chronic ear infection   . Chronic kidney disease   . Chronic prostatitis   . Dental bridge present    permanent - upper  . Diabetes mellitus   . Elbow stiffness, left    s/p fracture many yrs ago.  arm does not straighten  . GERD (gastroesophageal reflux disease)    laryngeal involvement  . Hyperlipidemia   . Hypertension   . Obstructive sleep apnea    CPAP-9  . Osteoarthrosis, localized, primary, knee    post-traumatic  . Prostatitis   . Stroke [facility])  TIA  . Tachy-brady syndrome ([place])   . UTI (urinary tract infection)     Past Surgical History:  Procedure Laterality Date  . APPENDECTOMY    . BELPHAROPTOSIS REPAIR     Dr [name]---didn't resolve weepy eye and eyelid drooping  . CARDIOVERSION  04/13/2010  . CATARACT EXTRACTION, BILATERAL  2009  . Chest pain  8/12   Stress test benign  . ESOPHAGEAL DILATION  05/26/2016   Procedure: ESOPHAGEAL DILATION;  Surgeon: Lucilla Lame, MD;  Location: [place];  Service: Endoscopy;;  . ESOPHAGOGASTRODUODENOSCOPY (EGD) WITH PROPOFOL N/A 05/26/2016   Procedure: ESOPHAGOGASTRODUODENOSCOPY (EGD) WITH PROPOFOL;  Surgeon: Lucilla Lame, MD;  Location: [place];  Service: Endoscopy;  Laterality: N/A;  Diabetic - oral meds sleep apnea  . EYE SURGERY    . FRACTURE SURGERY Left    elbow  . KNEE  SURGERY  1998   plate after fracture, then removed for infection  . left elbow surgery    . MASTOIDECTOMY  8/08   Dr Idelle Crouch  . RHINOPLASTY  5/10   and septoplasty  . SUBACROMIAL DECOMPRESSION Right 2005   Arthroscopic (for rotator cuff and biceps tendon ruptures)  . TEAR DUCT PROBING  11/13   Dr Vickki Muff  . TOTAL KNEE ARTHROPLASTY Left 09/01/2014   Procedure: TOTAL KNEE ARTHROPLASTY;  Surgeon: Dereck Leep, MD;  Location: [facility];  Service: Orthopedics;  Laterality: Left;  . TRIGGER FINGER RELEASE Left 02/25/2015   Procedure: LEFT LONG TRIGGER RELEASE;  Surgeon: Dereck Leep, MD;  Location: [facility];  Service: Orthopedics;  Laterality: Left;  Marland Kitchen VENOUS ABLATION      Family History  Problem Relation Age of Onset  . Colon cancer Father   . Diabetes Father   . Hypertension Father   . Other Mother        natural causes  . Heart attack Neg Hx   . Stroke Neg Hx     Social History   Socioeconomic History  . Marital status: Widowed    Spouse name: Not on file  . Number of children: 2  . Years of education: Not on file  . Highest education level: Not on file  Occupational History  . Occupation: Theme park manager    Comment: Retired--[employer]  Tobacco Use  . Smoking status: Former Smoker    Packs/day: 1.00    Years: 0.00    Pack years: 0.00    Types: Cigarettes    Quit date: 01/03/1969    Years since quitting: 50.7  . Smokeless tobacco: Never Used  Substance and Sexual Activity  . Alcohol use: No    Alcohol/week: 1.0 standard drink    Types: 1 Glasses of wine per week    Comment: rare wine. 1-2x/yr.  . Drug use: No  . Sexual activity: Not on file  Other Topics Concern  . Not on file  Social History Narrative   Has living will   Health care POA-- son Shanon Brow   Has DNR order from the past---form redone 06/25/10   Probably no feeding tube if cognitively unaware   Social Determinants of Health   Financial Resource Strain:   . Difficulty of Paying Living Expenses: Not on file    Food Insecurity:   . Worried About Charity fundraiser in the Last Year: Not on file  . Ran Out of Food in the Last Year: Not on file  Transportation Needs:   . Lack of Transportation (Medical): Not on file  . Lack of Transportation (  Non-Medical): Not on file  Physical Activity:   . Days of Exercise per Week: Not on file  . Minutes of Exercise per Session: Not on file  Stress:   . Feeling of Stress : Not on file  Social Connections:   . Frequency of Communication with Friends and Family: Not on file  . Frequency of Social Gatherings with Friends and Family: Not on file  . Attends Religious Services: Not on file  . Active Member of Clubs or Organizations: Not on file  . Attends Archivist Meetings: Not on file  . Marital Status: Not on file  Intimate Partner Violence:   . Fear of Current or Ex-Partner: Not on file  . Emotionally Abused: Not on file  . Physically Abused: Not on file  . Sexually Abused: Not on file   Review of Systems No chest pain No dizziness Eating okay Uses "very little salt"    Objective:   Physical Exam Constitutional:      Appearance: Normal appearance.  Cardiovascular:     Rate and Rhythm: Normal rate and regular rhythm.     Heart sounds: No murmur heard.  No gallop.   Pulmonary:     Effort: Pulmonary effort is normal.     Breath sounds: No wheezing or rales.     Comments: Decreased breath sounds at both bases Dullness at right base and about halfway up on left Musculoskeletal:     Cervical back: Neck supple.     Comments: Edema in legs---both wrapped in UNNA  Lymphadenopathy:     Cervical: No cervical adenopathy.  Neurological:     Mental Status: He is alert.            Assessment & Plan:

## 2019-09-16 NOTE — Assessment & Plan Note (Signed)
Small one on the CXR on left  Possible mild on right as well Not really pulmonary edema Will start furosemide 40mg  daily Will need echo---will defer to cardiology

## 2019-09-16 NOTE — Patient Instructions (Signed)
-   skip warfarin today, then continue warfarin dosage of 1 tablets daily except 1.5 tablet on SUNDAYS, TUESDAYS & THURSDAYS.   - Recheck in 2 weeks.   Please be consistent with your green intake - please pick a day or days each week to have your greens and have them every week on that day (your pills are on a schedule, so your greens need to be on one)

## 2019-09-17 LAB — RENAL FUNCTION PANEL
Albumin: 4 g/dL (ref 3.5–5.2)
BUN: 19 mg/dL (ref 6–23)
CO2: 28 mEq/L (ref 19–32)
Calcium: 10.3 mg/dL (ref 8.4–10.5)
Chloride: 105 mEq/L (ref 96–112)
Creatinine, Ser: 0.81 mg/dL (ref 0.40–1.50)
GFR: 90.17 mL/min (ref >60.00)
Glucose, Bld: 120 mg/dL — ABNORMAL HIGH (ref 70–99)
Phosphorus: 3.1 mg/dL (ref 2.3–4.6)
Potassium: 3.9 mEq/L (ref 3.5–5.1)
Sodium: 140 mEq/L (ref 135–145)

## 2019-09-17 LAB — BRAIN NATRIURETIC PEPTIDE: Pro B Natriuretic peptide (BNP): 270 pg/mL — ABNORMAL HIGH (ref 0.0–100.0)

## 2019-09-17 LAB — CBC
HCT: 42 % (ref 39.0–52.0)
Hemoglobin: 13.9 g/dL (ref 13.0–17.0)
MCHC: 33.1 g/dL (ref 30.0–36.0)
MCV: 94.5 fl (ref 78.0–100.0)
Platelets: 102 10*3/uL — ABNORMAL LOW (ref 150.0–400.0)
RBC: 4.45 Mil/uL (ref 4.22–5.81)
RDW: 13.4 % (ref 11.5–15.5)
WBC: 7.2 10*3/uL (ref 4.0–10.5)

## 2019-09-17 LAB — HEPATIC FUNCTION PANEL
ALT: 30 U/L (ref 0–53)
AST: 22 U/L (ref 0–37)
Albumin: 4 g/dL (ref 3.5–5.2)
Alkaline Phosphatase: 71 U/L (ref 39–117)
Bilirubin, Direct: 0.2 mg/dL (ref 0.0–0.3)
Total Bilirubin: 1 mg/dL (ref 0.2–1.2)
Total Protein: 6.2 g/dL (ref 6.0–8.3)

## 2019-09-17 LAB — SEDIMENTATION RATE: Sed Rate: 5 mm/hr (ref 0–20)

## 2019-09-17 NOTE — Progress Notes (Signed)
Cardiology Office Note    Date:  09/19/2019   ID:  Michael Colon, DOB 1930/12/31, MRN [id number]  PCP:  Venia Carbon, MD  Cardiologist:  Ida Rogue, MD  Electrophysiologist:  None   Chief Complaint: Volume overload  History of Present Illness:   Michael Colon is a 86 y.o. male with history of CAD noted on noninvasive imaging, tachybradycardia syndrome, atrial flutter status post ablation on warfarin, diastolic dysfunction, dilated thoracic aorta, diabetes mellitus, TIA, HTN, HLD, lymphedema, venous stasis, varicose veins status post laser ablation along the right lower extremity, BPH, OSA on CPAP, and GERD who presents for evaluation of volume overload.  Prior Holter monitor showed sinus rhythm with pauses up to 2.86 seconds, bradycardia with rates into the 20s to 30s bpm overnight and 30s to 50s bpm during the day.  In this setting, he was evaluated by EP for bradycardia with PPM not recommended at that time.  Prior echo from 2011 demonstrated an EF 65 to 13%, diastolic dysfunction, degenerative mitral valve with mitral valve prolapse and trace to mild regurgitation, dilated left atrium, aortic atherosclerosis, dilated thoracic aorta, normal RV systolic function, trivial tricuspid regurgitation.  Calcium score from 04/2015 of 1349, which was the 88th percentile for age and sex matched control.  In this setting, he underwent Lexiscan MPI in 04/2015 which showed no evidence of ischemia with normal EF and was overall a low risk study.  He was last seen in the office in 10/2018 for routine follow-up with a documented weight of 222 pounds.  No changes were made.  With worsening lower extremity swelling, he has been seen by podiatry and vascular surgery with Unna wraps being placed.  He was seen by his PCP on 09/16/2019 with weight gain, swelling, and exertional dyspnea.  Weight at that visit was 244 pounds which was up from 219 pounds at his visit with them on 08/20/2019.  Chest x-ray  demonstrated mild bibasilar atelectasis with a possible small left pleural effusion.  Labs as below.  He was started on Lasix 40 mg daily.  He comes in today for evaluation of recent sudden onset weight gain, worsening upper and lower extremity swelling, and exertional dyspnea.  At baseline, he indicates he typically checks his weight and blood pressure approximately every 10 days and through improved lifestyle with smaller portions he had previously gotten his weight down to 213 pounds.  Approximately 2 weeks ago, he stepped on a scale and noted his weight was up 12 to 13 pounds.  The next day, he weighed again with a reported weight gain of 24 to 25 pounds.  With this, he noted worsening lower extremity swelling with weeping, abdominal distention, swelling along the dorsal aspects of his bilateral hands, and exertional dyspnea.  He denied any chest pain, palpitations, dizziness, presyncope, syncope, worsening orthopnea, PND, or early satiety.  He is unaware of any dietary or medication changes leading up to this event.  Following initiation of Lasix 40 mg daily, his weight today has improved from his visit with his PCP with a trend of 244-->235 pounds, though does remain up from his last clinic visit in our office in 10/2018, with a weight at that time of 222 pounds.  He has not weighed himself by his home scale since starting Lasix on 9/13, though does report his peak weight at home was approximately 237 to 238 pounds.  He denies any falls, hematochezia, or melena.  He has a follow-up visit with vascular surgery this  afternoon for his lower extremities to be rewrapped.   Labs independently reviewed: 09/16/2019 - INR 3.8 09/2019 - potassium 3.9, BUN 19, SCr 0.81, albumin 4.0, AST/ALT normal, HGB 13.9, PLT 102, BNP 270 02/2019 - TC 110, TG 46, HDL 64, LDL 37, free T4 normal  Past Medical History:  Diagnosis Date  . Asthma   . Atrial flutter ([place]) 2007  . Band keratopathy   . Benign prostatic hypertrophy    . Chronic ear infection   . Chronic kidney disease   . Chronic prostatitis   . Dental bridge present    permanent - upper  . Diabetes mellitus   . Elbow stiffness, left    s/p fracture many yrs ago.  arm does not straighten  . GERD (gastroesophageal reflux disease)    laryngeal involvement  . Hyperlipidemia   . Hypertension   . Obstructive sleep apnea    CPAP-9  . Osteoarthrosis, localized, primary, knee    post-traumatic  . Prostatitis   . Stroke [facility])    TIA  . Tachy-brady syndrome ([place])   . UTI (urinary tract infection)     Past Surgical History:  Procedure Laterality Date  . APPENDECTOMY    . BELPHAROPTOSIS REPAIR     Dr [name]---didn't resolve weepy eye and eyelid drooping  . CARDIOVERSION  04/13/2010  . CATARACT EXTRACTION, BILATERAL  2009  . Chest pain  8/12   Stress test benign  . ESOPHAGEAL DILATION  05/26/2016   Procedure: ESOPHAGEAL DILATION;  Surgeon: Lucilla Lame, MD;  Location: [facility];  Service: Endoscopy;;  . ESOPHAGOGASTRODUODENOSCOPY (EGD) WITH PROPOFOL N/A 05/26/2016   Procedure: ESOPHAGOGASTRODUODENOSCOPY (EGD) WITH PROPOFOL;  Surgeon: Lucilla Lame, MD;  Location: [place];  Service: Endoscopy;  Laterality: N/A;  Diabetic - oral meds sleep apnea  . EYE SURGERY    . FRACTURE SURGERY Left    elbow  . KNEE SURGERY  1998   plate after fracture, then removed for infection  . left elbow surgery    . MASTOIDECTOMY  8/08   Dr Idelle Crouch  . RHINOPLASTY  5/10   and septoplasty  . SUBACROMIAL DECOMPRESSION Right 2005   Arthroscopic (for rotator cuff and biceps tendon ruptures)  . TEAR DUCT PROBING  11/13   Dr Vickki Muff  . TOTAL KNEE ARTHROPLASTY Left 09/01/2014   Procedure: TOTAL KNEE ARTHROPLASTY;  Surgeon: Dereck Leep, MD;  Location: [facility];  Service: Orthopedics;  Laterality: Left;  . TRIGGER FINGER RELEASE Left 02/25/2015   Procedure: LEFT LONG TRIGGER RELEASE;  Surgeon: Dereck Leep, MD;  Location: [facility];  Service:  Orthopedics;  Laterality: Left;  Marland Kitchen VENOUS ABLATION      Current Medications: Current Meds  Medication Sig  . APPLE CIDER VINEGAR PO Take by mouth.  Marland Kitchen atorvastatin (LIPITOR) 80 MG tablet Take 1 tablet (80 mg total) by mouth daily.  . cetirizine (ZYRTEC) 10 MG tablet Take 10 mg by mouth daily.  . Cyanocobalamin (B-12) 2500 MCG SUBL Place under the tongue.  . docusate sodium (COLACE) 100 MG capsule Take 100 mg by mouth 2 (two) times daily.   . famotidine (PEPCID) 20 MG tablet Take 20 mg by mouth daily.   . finasteride (PROSCAR) 5 MG tablet Take 1 tablet by mouth once daily  . glucose blood (BAYER CONTOUR TEST) test strip USE STRIP TO TEST BLOOD SUGAR ONCE DAILY. Dx Code: E11.40  . ibuprofen (ADVIL,MOTRIN) 200 MG tablet Take 400 mg by mouth 2 (two) times daily.   Marland Kitchen loratadine (  CLARITIN) 10 MG tablet Take 10 mg by mouth daily.    . metFORMIN (GLUCOPHAGE) 500 MG tablet TAKE 1 TABLET BY MOUTH TWICE DAILY WITH MEALS  . Microlet Lancets MISC Use to test twice daily or as directed  . tamsulosin (FLOMAX) 0.4 MG CAPS capsule Take 1 capsule (0.4 mg total) by mouth daily.  Marland Kitchen triamterene-hydrochlorothiazide (DYAZIDE) 37.5-25 MG capsule Take 1 capsule by mouth in the morning  . warfarin (COUMADIN) 5 MG tablet TAKE 1 TO 2 BY MOUTH AS  DIRECTED BY COUMADIN CLINIC  . [DISCONTINUED] furosemide (LASIX) 40 MG tablet Take 1 tablet (40 mg total) by mouth daily.    Allergies:   Enoxaparin, Enoxaparin sodium, Latex, Nickel, Percocet [oxycodone-acetaminophen], and Tape   Social History   Socioeconomic History  . Marital status: Widowed    Spouse name: Not on file  . Number of children: 2  . Years of education: Not on file  . Highest education level: Not on file  Occupational History  . Occupation: Theme park manager    Comment: Retired--[employer]  Tobacco Use  . Smoking status: Former Smoker    Packs/day: 1.00    Years: 0.00    Pack years: 0.00    Types: Cigarettes    Quit date: 01/03/1969    Years since  quitting: 50.7  . Smokeless tobacco: Never Used  Substance and Sexual Activity  . Alcohol use: No    Alcohol/week: 1.0 standard drink    Types: 1 Glasses of wine per week    Comment: rare wine. 1-2x/yr.  . Drug use: No  . Sexual activity: Not on file  Other Topics Concern  . Not on file  Social History Narrative   Has living will   Health care POA-- son Shanon Brow   Has DNR order from the past---form redone 06/25/10   Probably no feeding tube if cognitively unaware   Social Determinants of Health   Financial Resource Strain:   . Difficulty of Paying Living Expenses: Not on file  Food Insecurity:   . Worried About Charity fundraiser in the Last Year: Not on file  . Ran Out of Food in the Last Year: Not on file  Transportation Needs:   . Lack of Transportation (Medical): Not on file  . Lack of Transportation (Non-Medical): Not on file  Physical Activity:   . Days of Exercise per Week: Not on file  . Minutes of Exercise per Session: Not on file  Stress:   . Feeling of Stress : Not on file  Social Connections:   . Frequency of Communication with Friends and Family: Not on file  . Frequency of Social Gatherings with Friends and Family: Not on file  . Attends Religious Services: Not on file  . Active Member of Clubs or Organizations: Not on file  . Attends Archivist Meetings: Not on file  . Marital Status: Not on file     Family History:  The patient's family history includes Colon cancer in his father; Diabetes in his father; Hypertension in his father; Other in his mother. There is no history of Heart attack or Stroke.  ROS:   Review of Systems  Constitutional: Positive for malaise/fatigue. Negative for chills, diaphoresis, fever and weight loss.  HENT: Negative for congestion.   Eyes: Negative for discharge and redness.  Respiratory: Positive for shortness of breath. Negative for cough, sputum production and wheezing.   Cardiovascular: Positive for leg swelling.  Negative for chest pain, palpitations, orthopnea, claudication and PND.  Gastrointestinal:  Negative for abdominal pain, heartburn, nausea and vomiting.       Abdominal swelling  Musculoskeletal: Negative for falls and myalgias.  Skin: Negative for rash.  Neurological: Positive for weakness. Negative for dizziness, tingling, tremors, sensory change, speech change, focal weakness and loss of consciousness.  Endo/Heme/Allergies: Does not bruise/bleed easily.  Psychiatric/Behavioral: Negative for substance abuse. The patient is not nervous/anxious.   All other systems reviewed and are negative.    EKGs/Labs/Other Studies Reviewed:    Studies reviewed were summarized above. The additional studies were reviewed today:  2D echo 09/2009: EF 65 to 12%, diastolic dysfunction, degenerative mitral valve with mitral valve prolapse and trace to mild regurgitation, dilated left atrium, aortic atherosclerosis, dilated thoracic aorta, normal RV systolic function, trivial tricuspid regurgitation. __________  Carlton Adam MPI 08/2010: No significant ischemia, normal wall motion, EF 79%, no EKG changes concerning for ischemia, overall low risk study. __________  Carlton Adam MPI 04/2015: No significant ischemia, EF 67%, normal wall motion, no EKG changes concerning for ischemia at peak stress or recovery, overall low risk scan. ___________  CT cardiac scoring 04/2015: Calcium score 1349, 88th percentile for age and sex matched control, dilated ascending aorta   EKG:  EKG is ordered today.  The EKG ordered today demonstrates sinus bradycardia, 51 bpm, first AV block, left axis deviation, RBBB, when compared to prior no significant change outside of slight progression of first-degree AV block  Recent Labs: 09/16/2019: ALT 30; BUN 19; Creatinine, Ser 0.81; Hemoglobin 13.9; Platelets 102.0; Potassium 3.9; Pro B Natriuretic peptide (BNP) 270.0; Sodium 140  Recent Lipid Panel    Component Value Date/Time   CHOL 110  02/15/2019 1004   TRIG 46.0 02/15/2019 1004   HDL 64.60 02/15/2019 1004   CHOLHDL 2 02/15/2019 1004   VLDL 9.2 02/15/2019 1004   LDLCALC 37 02/15/2019 1004    PHYSICAL EXAM:    VS:  BP 140/76   Pulse (!) 51   Resp 16   Wt 235 lb 9.6 oz (106.9 kg)   SpO2 96%   BMI 33.81 kg/m   BMI: Body mass index is 33.81 kg/m.  Physical Exam Constitutional:      Appearance: He is well-developed.  HENT:     Head: Normocephalic and atraumatic.  Eyes:     General:        Right eye: No discharge.        Left eye: No discharge.  Neck:     Vascular: No JVD.  Cardiovascular:     Rate and Rhythm: Regular rhythm. Bradycardia present.     Pulses: No midsystolic click and no opening snap.          Posterior tibial pulses are 1+ on the right side and 1+ on the left side.     Heart sounds: S1 normal and S2 normal. Heart sounds not distant. Murmur heard. High-pitched blowing holosystolic murmur is present with a grade of 2/6 at the apex.  No friction rub.  Pulmonary:     Effort: Pulmonary effort is normal. No respiratory distress.     Breath sounds: Examination of the left-lower field reveals rales. Rales present. No decreased breath sounds or wheezing.  Chest:     Chest wall: No tenderness.  Abdominal:     General: There is distension.     Palpations: Abdomen is soft.     Tenderness: There is no abdominal tenderness.     Comments: Abdomen is mildly distended  Musculoskeletal:     Cervical back: Normal range of  motion.     Right lower leg: Edema present.     Left lower leg: Edema present.     Comments: 2+ bilateral lower extremity pitting edema to the mid thighs with weeping noted  Skin:    General: Skin is warm and dry.     Nails: There is no clubbing.  Neurological:     Mental Status: He is alert and oriented to person, place, and time.  Psychiatric:        Speech: Speech normal.        Behavior: Behavior normal.        Thought Content: Thought content normal.        Judgment:  Judgment normal.     Wt Readings from Last 3 Encounters:  09/19/19 235 lb 9.6 oz (106.9 kg)  09/16/19 244 lb (110.7 kg)  09/12/19 243 lb (110.2 kg)     ASSESSMENT & PLAN:   1. Acute CHF: Type uncertain.  His weight has improved though he remains significantly volume overloaded with a weight that is at least 15 pounds above his dry weight, possibly more given recent dietary changes with weight loss preceding volume overload.  He has NYHA class II symptoms.  Escalate Lasix to 60 mg daily.  Check echo.  Based on these results he may require ischemic evaluation as outlined below.  Repeat BMP in 1 week following diuresis.  Escalate GDMT as indicated based on echo results.  CHF education.  2. CAD involving the native coronary arteries: No symptoms of chest pain.  He does note some mild exertional dyspnea in the setting of volume overload.  He remains on Coumadin in place of aspirin.  Following updated echo as outlined above, should there be a new cardiomyopathy or WMA concerning for ischemia, given his presentation of significant volume overload he would require ischemic evaluation with this likely being a diagnostic R/LHC.  3. Atrial flutter/tachybradycardia syndrome: Maintaining sinus rhythm with a bradycardic heart rate.  Not on AV nodal blocking agents secondary to bradycardia and history of sinus pauses.  Previously evaluated by EP with no indication for PPM.  Stable without symptoms concerning for presyncope or syncope.  He remains on Coumadin which is managed by the Coumadin clinic without symptoms of bleeding and stable hemoglobin.  4. Mitral regurgitation: Possibly exacerbated in the setting of volume overload.  Diuresis as above.  Update echo as above.  5. Lymphedema with venous stasis: Longstanding issue that has been exacerbated over the past couple of weeks.  He has follow-up with vascular surgery for Unna boot rewrapping later this afternoon.  Leg elevation recommended.  Escalate diuresis  as outlined above.  Recent CMP showed an albumin of 4.0.  6. Dilated thoracic aorta: Optimal blood pressure control recommended.  Update echo as above.  7. HTN: Blood pressure is mildly elevated in the office at 140/76.  Titrate furosemide as outlined above.  He otherwise remains on Dyazide.  8. HLD: LDL 37 from 02/2019 with normal LFT in 09/2019.  He remains on atorvastatin.  9. OSA: Continued adherence with CPAP recommended.  Disposition: F/u with Dr. Rockey Situ or an APP in 1 week.   Medication Adjustments/Labs and Tests Ordered: Current medicines are reviewed at length with the patient today.  Concerns regarding medicines are outlined above. Medication changes, Labs and Tests ordered today are summarized above and listed in the Patient Instructions accessible in Encounters.   Signed, Christell Faith, PA-C 09/19/2019 11:22 AM     [place] [address]  [place], [place] [address] [phone]

## 2019-09-19 ENCOUNTER — Other Ambulatory Visit (INDEPENDENT_AMBULATORY_CARE_PROVIDER_SITE_OTHER): Payer: Self-pay | Admitting: Nurse Practitioner

## 2019-09-19 ENCOUNTER — Ambulatory Visit (INDEPENDENT_AMBULATORY_CARE_PROVIDER_SITE_OTHER): Payer: Medicare Other | Admitting: Physician Assistant

## 2019-09-19 ENCOUNTER — Encounter: Payer: Self-pay | Admitting: Physician Assistant

## 2019-09-19 ENCOUNTER — Encounter (INDEPENDENT_AMBULATORY_CARE_PROVIDER_SITE_OTHER): Payer: Self-pay

## 2019-09-19 ENCOUNTER — Other Ambulatory Visit: Payer: Self-pay

## 2019-09-19 ENCOUNTER — Ambulatory Visit (INDEPENDENT_AMBULATORY_CARE_PROVIDER_SITE_OTHER): Payer: Medicare Other | Admitting: Nurse Practitioner

## 2019-09-19 VITALS — BP 183/80 | HR 52 | Resp 16 | Wt 234.0 lb

## 2019-09-19 VITALS — BP 140/76 | HR 51 | Resp 16 | Wt 235.6 lb

## 2019-09-19 DIAGNOSIS — I4892 Unspecified atrial flutter: Secondary | ICD-10-CM

## 2019-09-19 DIAGNOSIS — L97201 Non-pressure chronic ulcer of unspecified calf limited to breakdown of skin: Secondary | ICD-10-CM | POA: Diagnosis not present

## 2019-09-19 DIAGNOSIS — I2510 Atherosclerotic heart disease of native coronary artery without angina pectoris: Secondary | ICD-10-CM | POA: Diagnosis not present

## 2019-09-19 DIAGNOSIS — I509 Heart failure, unspecified: Secondary | ICD-10-CM | POA: Diagnosis not present

## 2019-09-19 DIAGNOSIS — I495 Sick sinus syndrome: Secondary | ICD-10-CM | POA: Diagnosis not present

## 2019-09-19 DIAGNOSIS — I890 Lymphedema, not elsewhere classified: Secondary | ICD-10-CM

## 2019-09-19 DIAGNOSIS — L97909 Non-pressure chronic ulcer of unspecified part of unspecified lower leg with unspecified severity: Secondary | ICD-10-CM

## 2019-09-19 DIAGNOSIS — G4733 Obstructive sleep apnea (adult) (pediatric): Secondary | ICD-10-CM | POA: Diagnosis not present

## 2019-09-19 DIAGNOSIS — I77810 Thoracic aortic ectasia: Secondary | ICD-10-CM

## 2019-09-19 DIAGNOSIS — E785 Hyperlipidemia, unspecified: Secondary | ICD-10-CM | POA: Diagnosis not present

## 2019-09-19 DIAGNOSIS — I340 Nonrheumatic mitral (valve) insufficiency: Secondary | ICD-10-CM | POA: Diagnosis not present

## 2019-09-19 DIAGNOSIS — I10 Essential (primary) hypertension: Secondary | ICD-10-CM

## 2019-09-19 DIAGNOSIS — I83002 Varicose veins of unspecified lower extremity with ulcer of calf: Secondary | ICD-10-CM | POA: Diagnosis not present

## 2019-09-19 DIAGNOSIS — R0602 Shortness of breath: Secondary | ICD-10-CM

## 2019-09-19 DIAGNOSIS — I83009 Varicose veins of unspecified lower extremity with ulcer of unspecified site: Secondary | ICD-10-CM

## 2019-09-19 MED ORDER — FUROSEMIDE 40 MG PO TABS: 60.0000 mg | ORAL_TABLET | Freq: Every day | ORAL | 1 refills | Status: DC

## 2019-09-19 NOTE — Progress Notes (Signed)
History of Present Illness  There is no documented history at this time  Assessments & Plan   There are no diagnoses linked to this encounter.    Additional instructions  Subjective:  Patient presents with venous ulcer of the Bilateral lower extremity.    Procedure:  3 layer unna wrap was placed Bilateral lower extremity.   Plan:   Follow up in one week.  

## 2019-09-19 NOTE — Patient Instructions (Signed)
Medication Instructions:  Your physician has recommended you make the following change in your medication:  1- INCREASE Furosemide to 60 mg (1.5 tablets) by mouth once a day.  *If you need a refill on your cardiac medications before your next appointment, please call your pharmacy*  Lab Work: none If you have labs (blood work) drawn today and your tests are completely normal, you will receive your results only by: Marland Kitchen MyChart Message (if you have MyChart) OR . A paper copy in the mail If you have any lab test that is abnormal or we need to change your treatment, we will call you to review the results.  Testing/Procedures: Your physician has requested that you have an echocardiogram. Echocardiography is a painless test that uses sound waves to create images of your heart. It provides your doctor with information about the size and shape of your heart and how well your heart's chambers and valves are working. This procedure takes approximately one hour. There are no restrictions for this procedure. You may get an IV, if needed, to receive an ultrasound enhancing agent through to better visualize your heart.   Follow-Up: At [facility], you and your health needs are our priority.  As part of our continuing mission to provide you with exceptional heart care, we have created designated Provider Care Teams.  These Care Teams include your primary Cardiologist (physician) and Advanced Practice Providers (APPs -  Physician Assistants and Nurse Practitioners) who all work together to provide you with the care you need, when you need it.  We recommend signing up for the patient portal called "MyChart".  Sign up information is provided on this After Visit Summary.  MyChart is used to connect with patients for Virtual Visits (Telemedicine).  Patients are able to view lab/test results, encounter notes, upcoming appointments, etc.  Non-urgent messages can be sent to your provider as well.   To learn more  about what you can do with MyChart, go to [email or web address].    Your next appointment:   1 week(s)  The format for your next appointment:   In Person  Provider:    You may see Ida Rogue, MD or one of the following Advanced Practice Providers on your designated Care Team:    Murray Hodgkins, NP  Christell Faith, PA-C  Marrianne Mood, PA-C

## 2019-09-22 ENCOUNTER — Encounter (INDEPENDENT_AMBULATORY_CARE_PROVIDER_SITE_OTHER): Payer: Self-pay | Admitting: Nurse Practitioner

## 2019-09-23 NOTE — Progress Notes (Signed)
Cardiology Office Note    Date:  09/30/2019   ID:  Michael Colon, DOB 1930-03-15, MRN [id number]  PCP:  Venia Carbon, MD  Cardiologist:  Ida Rogue, MD  Electrophysiologist:  None   Chief Complaint: Follow up  History of Present Illness:   Michael Colon is a 86 y.o. male with history of CAD noted on noninvasive imaging, tachybradycardia syndrome, atrial flutter status post ablation on warfarin, diastolic dysfunction, dilated thoracic aorta, diabetes mellitus, TIA, HTN, HLD, lymphedema, venous stasis, varicose veins status post laser ablation along the right lower extremity, BPH, OSA on CPAP, and GERD who presents for follow up of volume overload.  Prior Holter monitor showed sinus rhythm with pauses up to 2.86 seconds, bradycardia with rates into the 20s to 30s bpm overnight and 30s to 50s bpm during the day.  In this setting, he was evaluated by EP for bradycardia with PPM not recommended at that time.  Prior echo from 2011 demonstrated an EF 65 to 27%, diastolic dysfunction, degenerative mitral valve with mitral valve prolapse and trace to mild regurgitation, dilated left atrium, aortic atherosclerosis, dilated thoracic aorta, normal RV systolic function, trivial tricuspid regurgitation.  Calcium score from 04/2015 of 1349, which was the 88th percentile for age and sex matched control.  In this setting, he underwent Lexiscan MPI in 04/2015 which showed no evidence of ischemia with normal EF and was overall a low risk study.  He was seen in the office in 10/2018 for routine follow-up with a documented weight of 222 pounds.  No changes were made.  With worsening lower extremity swelling, he has been seen by podiatry and vascular surgery with Unna wraps being placed.  He was seen by his PCP on 09/16/2019 with weight gain, swelling, and exertional dyspnea.  Weight at that visit was 244 pounds which was up from 219 pounds at his visit with them on 08/20/2019.  Chest x-ray demonstrated  mild bibasilar atelectasis with a possible small left pleural effusion.  Labs as below.  He was started on Lasix 40 mg daily.  I saw him for evaluation of volume overload on 09/19/2019 at which time he reported significant weight gain ~ 2 weeks prior to his visit with noted lower extremity swelling with weeping, abdominal distention, swelling along the dorsal aspects of his bilateral hands, and exertional dyspnea. His weight was noted to be 235 pounds (improved from the 244 pounds at his PCP's office as above), though remained elevated from his visit in our office in 10/2018 with a weight of 222 pounds at that time. His Lasix was titrated to 60 mg daily and echo was ordered, which is pending at this time.   He comes in doing well today.  With outpatient diuresis, his swelling and dyspnea have resolved.  His weight is down 30 lbs today when compared to his last clinic weight on 09/19/2019.  He denies any chest pain or orthopnea.  He continues to watch his salt intake.  He remains on Lasix 60 mg daily.  Since he was last seen he has also noted to other improvements including the ability to stand for prolonged timeframes without needing to sit down and is also now able to walk more upright.  He is uncertain what led to these improvements.  No dizziness, presyncope, or syncope.  No falls, hematochezia, or melena.  INR is therapeutic as outlined below.  He has followed up with vascular surgery with noninvasive imaging indicating no reflux.   Labs independently  reviewed: 09/30/2019 - INR 2.8 09/2019 - potassium 3.9, BUN 19, SCr 0.81, albumin 4.0, AST/ALT normal, HGB 13.9, PLT 102, BNP 270 02/2019 - TC 110, TG 46, HDL 64, LDL 37, free T4 normal  Past Medical History:  Diagnosis Date  . Asthma   . Atrial flutter ([place]) 2007  . Band keratopathy   . Benign prostatic hypertrophy   . Chronic ear infection   . Chronic kidney disease   . Chronic prostatitis   . Dental bridge present    permanent - upper  .  Diabetes mellitus   . Elbow stiffness, left    s/p fracture many yrs ago.  arm does not straighten  . GERD (gastroesophageal reflux disease)    laryngeal involvement  . Hyperlipidemia   . Hypertension   . Obstructive sleep apnea    CPAP-9  . Osteoarthrosis, localized, primary, knee    post-traumatic  . Prostatitis   . Stroke [facility])    TIA  . Tachy-brady syndrome ([place])   . UTI (urinary tract infection)     Past Surgical History:  Procedure Laterality Date  . APPENDECTOMY    . BELPHAROPTOSIS REPAIR     Dr [name]---didn't resolve weepy eye and eyelid drooping  . CARDIOVERSION  04/13/2010  . CATARACT EXTRACTION, BILATERAL  2009  . Chest pain  8/12   Stress test benign  . ESOPHAGEAL DILATION  05/26/2016   Procedure: ESOPHAGEAL DILATION;  Surgeon: Lucilla Lame, MD;  Location: [place];  Service: Endoscopy;;  . ESOPHAGOGASTRODUODENOSCOPY (EGD) WITH PROPOFOL N/A 05/26/2016   Procedure: ESOPHAGOGASTRODUODENOSCOPY (EGD) WITH PROPOFOL;  Surgeon: Lucilla Lame, MD;  Location: [place];  Service: Endoscopy;  Laterality: N/A;  Diabetic - oral meds sleep apnea  . EYE SURGERY    . FRACTURE SURGERY Left    elbow  . KNEE SURGERY  1998   plate after fracture, then removed for infection  . left elbow surgery    . MASTOIDECTOMY  8/08   Dr Idelle Crouch  . RHINOPLASTY  5/10   and septoplasty  . SUBACROMIAL DECOMPRESSION Right 2005   Arthroscopic (for rotator cuff and biceps tendon ruptures)  . TEAR DUCT PROBING  11/13   Dr Vickki Muff  . TOTAL KNEE ARTHROPLASTY Left 09/01/2014   Procedure: TOTAL KNEE ARTHROPLASTY;  Surgeon: Dereck Leep, MD;  Location: [facility];  Service: Orthopedics;  Laterality: Left;  . TRIGGER FINGER RELEASE Left 02/25/2015   Procedure: LEFT LONG TRIGGER RELEASE;  Surgeon: Dereck Leep, MD;  Location: [facility];  Service: Orthopedics;  Laterality: Left;  Marland Kitchen VENOUS ABLATION      Current Medications: Current Meds  Medication Sig  . APPLE CIDER VINEGAR PO  Take by mouth.  Marland Kitchen atorvastatin (LIPITOR) 80 MG tablet Take 1 tablet (80 mg total) by mouth daily.  . cetirizine (ZYRTEC) 10 MG tablet Take 10 mg by mouth daily.  . Cyanocobalamin (B-12) 2500 MCG SUBL Place under the tongue.  . docusate sodium (COLACE) 100 MG capsule Take 100 mg by mouth 2 (two) times daily.   . famotidine (PEPCID) 20 MG tablet Take 20 mg by mouth daily.   . finasteride (PROSCAR) 5 MG tablet Take 1 tablet by mouth once daily  . furosemide (LASIX) 40 MG tablet Take 1.5 tablets (60 mg total) by mouth daily.  Marland Kitchen glucose blood (BAYER CONTOUR TEST) test strip USE STRIP TO TEST BLOOD SUGAR ONCE DAILY. Dx Code: E11.40  . ibuprofen (ADVIL,MOTRIN) 200 MG tablet Take 400 mg by mouth 2 (two) times daily.   Marland Kitchen  loratadine (CLARITIN) 10 MG tablet Take 10 mg by mouth daily.    . metFORMIN (GLUCOPHAGE) 500 MG tablet TAKE 1 TABLET BY MOUTH TWICE DAILY WITH MEALS  . Microlet Lancets MISC Use to test twice daily or as directed  . tamsulosin (FLOMAX) 0.4 MG CAPS capsule Take 1 capsule (0.4 mg total) by mouth daily.  Marland Kitchen triamterene-hydrochlorothiazide (DYAZIDE) 37.5-25 MG capsule Take 1 capsule by mouth in the morning  . warfarin (COUMADIN) 5 MG tablet TAKE 1 TO 2 BY MOUTH AS  DIRECTED BY COUMADIN CLINIC    Allergies:   Enoxaparin, Enoxaparin sodium, Latex, Nickel, Percocet [oxycodone-acetaminophen], and Tape   Social History   Socioeconomic History  . Marital status: Widowed    Spouse name: Not on file  . Number of children: 2  . Years of education: Not on file  . Highest education level: Not on file  Occupational History  . Occupation: Theme park manager    Comment: Retired--[employer]  Tobacco Use  . Smoking status: Former Smoker    Packs/day: 1.00    Years: 0.00    Pack years: 0.00    Types: Cigarettes    Quit date: 01/03/1969    Years since quitting: 50.7  . Smokeless tobacco: Never Used  Substance and Sexual Activity  . Alcohol use: No    Alcohol/week: 1.0 standard drink    Types: 1 Glasses  of wine per week    Comment: rare wine. 1-2x/yr.  . Drug use: No  . Sexual activity: Not on file  Other Topics Concern  . Not on file  Social History Narrative   Has living will   Health care POA-- son Shanon Brow   Has DNR order from the past---form redone 06/25/10   Probably no feeding tube if cognitively unaware   Social Determinants of Health   Financial Resource Strain:   . Difficulty of Paying Living Expenses: Not on file  Food Insecurity:   . Worried About Charity fundraiser in the Last Year: Not on file  . Ran Out of Food in the Last Year: Not on file  Transportation Needs:   . Lack of Transportation (Medical): Not on file  . Lack of Transportation (Non-Medical): Not on file  Physical Activity:   . Days of Exercise per Week: Not on file  . Minutes of Exercise per Session: Not on file  Stress:   . Feeling of Stress : Not on file  Social Connections:   . Frequency of Communication with Friends and Family: Not on file  . Frequency of Social Gatherings with Friends and Family: Not on file  . Attends Religious Services: Not on file  . Active Member of Clubs or Organizations: Not on file  . Attends Archivist Meetings: Not on file  . Marital Status: Not on file     Family History:  The patient's family history includes Colon cancer in his father; Diabetes in his father; Hypertension in his father; Other in his mother. There is no history of Heart attack or Stroke.  ROS:   Review of Systems  Constitutional: Positive for malaise/fatigue. Negative for chills, diaphoresis, fever and weight loss.  HENT: Negative for congestion.   Eyes: Negative for discharge and redness.  Respiratory: Negative for cough, sputum production, shortness of breath and wheezing.   Cardiovascular: Negative for chest pain, palpitations, orthopnea, claudication, leg swelling and PND.  Gastrointestinal: Negative for abdominal pain, blood in stool, heartburn, melena, nausea and vomiting.    Musculoskeletal: Negative for falls and myalgias.  Skin: Negative for rash.  Neurological: Negative for dizziness, tingling, tremors, sensory change, speech change, focal weakness, loss of consciousness and weakness.  Endo/Heme/Allergies: Does not bruise/bleed easily.  Psychiatric/Behavioral: Negative for substance abuse. The patient is not nervous/anxious.   All other systems reviewed and are negative.    EKGs/Labs/Other Studies Reviewed:    Studies reviewed were summarized above. The additional studies were reviewed today:  2D echo 09/2009: EF 65 to 09%, diastolic dysfunction, degenerative mitral valve with mitral valve prolapse and trace to mild regurgitation, dilated left atrium, aortic atherosclerosis, dilated thoracic aorta, normal RV systolic function, trivial tricuspid regurgitation. __________  Carlton Adam MPI 08/2010: No significant ischemia, normal wall motion, EF 79%, no EKG changes concerning for ischemia, overall low risk study. __________  Carlton Adam MPI 04/2015: No significant ischemia, EF 67%, normal wall motion, no EKG changes concerning for ischemia at peak stress or recovery, overall low risk scan. ___________  CT cardiac scoring 04/2015: Calcium score 1349, 88th percentile for age and sex matched control, dilated ascending aorta   EKG:  EKG is ordered today.  The EKG ordered today demonstrates NSR with sinus arrhythmia, 68 bpm, trifascicular block with RBBB, left anterior fascicular block, and first-degree AV block, rare PVC, no acute ST-T changes.  When compared to prior tracings, there has been some noted improvement in ventricular rate and PR interval with previously noted RBBB and left anterior fascicular block.  Reviewed with primary cardiologist.  Recent Labs: 09/16/2019: ALT 30; BUN 19; Creatinine, Ser 0.81; Hemoglobin 13.9; Platelets 102.0; Potassium 3.9; Pro B Natriuretic peptide (BNP) 270.0; Sodium 140  Recent Lipid Panel    Component Value Date/Time    CHOL 110 02/15/2019 1004   TRIG 46.0 02/15/2019 1004   HDL 64.60 02/15/2019 1004   CHOLHDL 2 02/15/2019 1004   VLDL 9.2 02/15/2019 1004   LDLCALC 37 02/15/2019 1004    PHYSICAL EXAM:    VS:  BP 140/70 (BP Location: Right Arm, Patient Position: Sitting, Cuff Size: Normal)   Pulse 68   Ht 5\' 10"  (1.778 m)   Wt 205 lb 8 oz (93.2 kg)   SpO2 97%   BMI 29.49 kg/m   BMI: Body mass index is 29.49 kg/m.  Physical Exam Constitutional:      Appearance: He is well-developed.  HENT:     Head: Normocephalic and atraumatic.  Eyes:     General:        Right eye: No discharge.        Left eye: No discharge.  Neck:     Vascular: No JVD.  Cardiovascular:     Rate and Rhythm: Normal rate and regular rhythm.     Pulses: No midsystolic click and no opening snap.          Posterior tibial pulses are 2+ on the right side and 2+ on the left side.     Heart sounds: S1 normal and S2 normal. Heart sounds not distant. Murmur heard. High-pitched blowing holosystolic murmur is present with a grade of 1/6 at the apex.  No friction rub.  Pulmonary:     Effort: Pulmonary effort is normal. No respiratory distress.     Breath sounds: Normal breath sounds. No decreased breath sounds, wheezing or rales.  Chest:     Chest wall: No tenderness.  Abdominal:     General: There is no distension.     Palpations: Abdomen is soft.     Tenderness: There is no abdominal tenderness.  Musculoskeletal:     Cervical  back: Normal range of motion.  Skin:    General: Skin is warm and dry.     Nails: There is no clubbing.  Neurological:     Mental Status: He is alert and oriented to person, place, and time.  Psychiatric:        Speech: Speech normal.        Behavior: Behavior normal.        Thought Content: Thought content normal.        Judgment: Judgment normal.     Wt Readings from Last 3 Encounters:  09/30/19 205 lb 8 oz (93.2 kg)  09/25/19 210 lb 9.6 oz (95.5 kg)  09/19/19 234 lb (106.1 kg)      ASSESSMENT & PLAN:   1. Acute CHF/lower extremity swelling: Significantly improved.  His weight has trended down 30 lbs since he was seen earlier this month.  In this setting, we will de-escalate oral Lasix to 20 mg daily and check a BMP.  Echo is pending.  Further recommendations including potential escalation of GDMT pending these results.  Recent noninvasive imaging through vascular surgery showed no evidence of venous insufficiency/DVT bilaterally.  2. CAD involving the native coronary arteries: No symptoms concerning for angina.  Dyspnea has improved with successful diuresis.  He remains on Coumadin in place of aspirin.  Follow-up echo as outlined above.  If he is found to have a new cardiomyopathy or wall motion abnormality concerning for ischemia he would require an ischemic evaluation at that time.  3. Atrial flutter/tachybradycardia syndrome/trifascicular block: Maintaining sinus rhythm not on AV nodal blocking agents secondary to prior bradycardic rates and sinus pauses.  Previously evaluated by EP with no indication for pacemaker implantation.  He is stable without symptoms concerning for presyncope or syncope.  EKG today is stable when compared to prior tracings.  Consider Zio patch versus referral to EP based on echo.  Discussed with primary cardiologist.  He remains on Coumadin which is monitored by the Coumadin clinic without symptoms of bleeding and stable hemoglobin.  CHA2DS2-VASc at least 5 (CHF, HTN, age x2, vascular disease).  4. Mitral regurgitation: Echo pending.  5. Dilated thoracic aorta: Optimal blood pressure control recommended.  Update echo as outlined above.  6. HTN: Blood pressure is mildly elevated in the office today.  Continue Dyazide and furosemide as outlined above.  Continued low-sodium diet recommended.  7. HLD: LDL 37 from 02/2019 with normal LFT in 09/2019.  He remains on atorvastatin.  8. OSA: Continued adherence to CPAP recommended.  Disposition: F/u with  Dr. Rockey Situ as previously scheduled next month following his echo.   Medication Adjustments/Labs and Tests Ordered: Current medicines are reviewed at length with the patient today.  Concerns regarding medicines are outlined above. Medication changes, Labs and Tests ordered today are summarized above and listed in the Patient Instructions accessible in Encounters.   Signed, Christell Faith, PA-C 09/30/2019 9:46 AM     [address], [place] [address] [phone]

## 2019-09-25 ENCOUNTER — Ambulatory Visit (INDEPENDENT_AMBULATORY_CARE_PROVIDER_SITE_OTHER): Payer: Medicare Other

## 2019-09-25 ENCOUNTER — Other Ambulatory Visit: Payer: Self-pay

## 2019-09-25 ENCOUNTER — Encounter (INDEPENDENT_AMBULATORY_CARE_PROVIDER_SITE_OTHER): Payer: Self-pay | Admitting: Nurse Practitioner

## 2019-09-25 ENCOUNTER — Ambulatory Visit (INDEPENDENT_AMBULATORY_CARE_PROVIDER_SITE_OTHER): Payer: Medicare Other | Admitting: Nurse Practitioner

## 2019-09-25 VITALS — BP 145/70 | HR 68 | Resp 16 | Wt 210.6 lb

## 2019-09-25 DIAGNOSIS — I83009 Varicose veins of unspecified lower extremity with ulcer of unspecified site: Secondary | ICD-10-CM

## 2019-09-25 DIAGNOSIS — I10 Essential (primary) hypertension: Secondary | ICD-10-CM

## 2019-09-25 DIAGNOSIS — L97909 Non-pressure chronic ulcer of unspecified part of unspecified lower leg with unspecified severity: Secondary | ICD-10-CM | POA: Diagnosis not present

## 2019-09-25 DIAGNOSIS — I83002 Varicose veins of unspecified lower extremity with ulcer of calf: Secondary | ICD-10-CM

## 2019-09-25 DIAGNOSIS — R601 Generalized edema: Secondary | ICD-10-CM

## 2019-09-25 DIAGNOSIS — L97201 Non-pressure chronic ulcer of unspecified calf limited to breakdown of skin: Secondary | ICD-10-CM | POA: Diagnosis not present

## 2019-09-30 ENCOUNTER — Other Ambulatory Visit: Payer: Self-pay

## 2019-09-30 ENCOUNTER — Ambulatory Visit (INDEPENDENT_AMBULATORY_CARE_PROVIDER_SITE_OTHER): Payer: Medicare Other | Admitting: Physician Assistant

## 2019-09-30 ENCOUNTER — Encounter: Payer: Self-pay | Admitting: Physician Assistant

## 2019-09-30 ENCOUNTER — Ambulatory Visit (INDEPENDENT_AMBULATORY_CARE_PROVIDER_SITE_OTHER): Payer: Medicare Other

## 2019-09-30 VITALS — BP 140/70 | HR 68 | Ht 70.0 in | Wt 205.5 lb

## 2019-09-30 DIAGNOSIS — I4892 Unspecified atrial flutter: Secondary | ICD-10-CM | POA: Diagnosis not present

## 2019-09-30 DIAGNOSIS — E785 Hyperlipidemia, unspecified: Secondary | ICD-10-CM

## 2019-09-30 DIAGNOSIS — Z5181 Encounter for therapeutic drug level monitoring: Secondary | ICD-10-CM | POA: Diagnosis not present

## 2019-09-30 DIAGNOSIS — M7989 Other specified soft tissue disorders: Secondary | ICD-10-CM | POA: Diagnosis not present

## 2019-09-30 DIAGNOSIS — I2510 Atherosclerotic heart disease of native coronary artery without angina pectoris: Secondary | ICD-10-CM | POA: Diagnosis not present

## 2019-09-30 DIAGNOSIS — I340 Nonrheumatic mitral (valve) insufficiency: Secondary | ICD-10-CM | POA: Diagnosis not present

## 2019-09-30 DIAGNOSIS — G4733 Obstructive sleep apnea (adult) (pediatric): Secondary | ICD-10-CM | POA: Diagnosis not present

## 2019-09-30 DIAGNOSIS — I509 Heart failure, unspecified: Secondary | ICD-10-CM | POA: Diagnosis not present

## 2019-09-30 DIAGNOSIS — I453 Trifascicular block: Secondary | ICD-10-CM

## 2019-09-30 DIAGNOSIS — I77810 Thoracic aortic ectasia: Secondary | ICD-10-CM

## 2019-09-30 DIAGNOSIS — I10 Essential (primary) hypertension: Secondary | ICD-10-CM | POA: Diagnosis not present

## 2019-09-30 LAB — POCT INR: INR: 2.8 (ref 2.0–3.0)

## 2019-09-30 MED ORDER — FUROSEMIDE 20 MG PO TABS: 20.0000 mg | ORAL_TABLET | Freq: Every day | ORAL | 1 refills | Status: DC

## 2019-09-30 NOTE — Patient Instructions (Signed)
Medication Instructions:  Your physician has recommended you make the following change in your medication:   DECREASE Lasix to 20 mg daily.  *If you need a refill on your cardiac medications before your next appointment, please call your pharmacy*   Lab Work: Bmet today  If you have labs (blood work) drawn today and your tests are completely normal, you will receive your results only by:  [place] (if you have MyChart) OR  A paper copy in the mail If you have any lab test that is abnormal or we need to change your treatment, we will call you to review the results.   Testing/Procedures: None ordered   Follow-Up: At [facility], you and your health needs are our priority.  As part of our continuing mission to provide you with exceptional heart care, we have created designated Provider Care Teams.  These Care Teams include your primary Cardiologist (physician) and Advanced Practice Providers (APPs -  Physician Assistants and Nurse Practitioners) who all work together to provide you with the care you need, when you need it.  We recommend signing up for the patient portal called "MyChart".  Sign up information is provided on this After Visit Summary.  MyChart is used to connect with patients for Virtual Visits (Telemedicine).  Patients are able to view lab/test results, encounter notes, upcoming appointments, etc.  Non-urgent messages can be sent to your provider as well.   To learn more about what you can do with MyChart, go to [email or web address].    Your next appointment:   As planned with an echo and Dr. Rockey Situ   The format for your next appointment:   In Person  Provider:   You may see Ida Rogue, MD or one of the following Advanced Practice Providers on your designated Care Team:    Murray Hodgkins, NP  Christell Faith, PA-C  Marrianne Mood, PA-C  Cadence Kathlen Mody, [place]    Other Instructions N/A

## 2019-09-30 NOTE — Patient Instructions (Addendum)
-   continue warfarin dosage of 1 tablets daily except 1.5 tablet on SUNDAYS, TUESDAYS & THURSDAYS.   - Recheck in 2 weeks.   Please be consistent with your green intake - please pick a day or days each week to have your greens and have them every week on that day (your pills are on a schedule, so your greens need to be on one)

## 2019-09-30 NOTE — Addendum Note (Signed)
Addended by: Othelia Pulling C on: 09/30/2019 02:00 PM   Modules accepted: Orders

## 2019-10-01 ENCOUNTER — Encounter (INDEPENDENT_AMBULATORY_CARE_PROVIDER_SITE_OTHER): Payer: Self-pay | Admitting: Nurse Practitioner

## 2019-10-01 LAB — BASIC METABOLIC PANEL
BUN/Creatinine Ratio: 42 — ABNORMAL HIGH (ref 10–24)
BUN: 42 mg/dL — ABNORMAL HIGH (ref 8–27)
CO2: 27 mmol/L (ref 20–29)
Calcium: 11.5 mg/dL — ABNORMAL HIGH (ref 8.6–10.2)
Chloride: 99 mmol/L (ref 96–106)
Creatinine, Ser: 1.01 mg/dL (ref 0.76–1.27)
GFR calc Af Amer: 77 mL/min/{1.73_m2} (ref >59)
GFR calc non Af Amer: 67 mL/min/{1.73_m2} (ref >59)
Glucose: 201 mg/dL — ABNORMAL HIGH (ref 65–99)
Potassium: 3.7 mmol/L (ref 3.5–5.2)
Sodium: 140 mmol/L (ref 134–144)

## 2019-10-01 NOTE — Progress Notes (Signed)
Subjective:    Patient ID: Michael Colon, male    DOB: 20-May-1930, 86 y.o.   MRN: [id number] Chief Complaint  Patient presents with  . Follow-up    unna and u/s follow up    The patient presents today for evaluation of lower extremity edema.  The patient was previously seen in 2018.  In 2018 the patient had his varicose veins treated by our practice.  The patient notes that his swelling was doing well until he recently stopped his glipizide.  Since the patient's last office visit, it was discovered that he was having a heart failure exacerbation.  Since the patient's last office visit he has lost approximately 30 pounds after adjustments to his diuretics.  The patient notes that he has had a reduction in swelling everywhere including his hands.  The previous ulcerations that were present are gone.  The patient was in [place] wraps and tolerated the [place] wraps well.  He has not had any further ulcerations.  Today noninvasive studies show no evidence of DVT or superficial venous thrombosis bilaterally no evidence of deep venous insufficiency seen bilaterally.  No evidence of reflux seen in either great saphenous vein.     Review of Systems  Cardiovascular: Positive for leg swelling.  All other systems reviewed and are negative.      Objective:   Physical Exam Vitals reviewed.  HENT:     Head: Normocephalic.  Cardiovascular:     Rate and Rhythm: Normal rate and regular rhythm.     Pulses: Normal pulses.  Pulmonary:     Effort: Pulmonary effort is normal.  Skin:    General: Skin is warm and dry.  Neurological:     Mental Status: He is alert and oriented to person, place, and time.  Psychiatric:        Mood and Affect: Mood normal.        Behavior: Behavior normal.        Thought Content: Thought content normal.        Judgment: Judgment normal.     BP (!) 145/70 (BP Location: Right Arm)   Pulse 68   Resp 16   Wt 210 lb 9.6 oz (95.5 kg)   BMI 30.22 kg/m   Past Medical  History:  Diagnosis Date  . Asthma   . Atrial flutter ([place]) 2007  . Band keratopathy   . Benign prostatic hypertrophy   . Chronic ear infection   . Chronic kidney disease   . Chronic prostatitis   . Dental bridge present    permanent - upper  . Diabetes mellitus   . Elbow stiffness, left    s/p fracture many yrs ago.  arm does not straighten  . GERD (gastroesophageal reflux disease)    laryngeal involvement  . Hyperlipidemia   . Hypertension   . Obstructive sleep apnea    CPAP-9  . Osteoarthrosis, localized, primary, knee    post-traumatic  . Prostatitis   . Stroke [facility])    TIA  . Tachy-brady syndrome ([place])   . UTI (urinary tract infection)     Social History   Socioeconomic History  . Marital status: Widowed    Spouse name: Not on file  . Number of children: 2  . Years of education: Not on file  . Highest education level: Not on file  Occupational History  . Occupation: Theme park manager    Comment: Retired--[employer]  Tobacco Use  . Smoking status: Former Smoker    Packs/day: 1.00  Years: 0.00    Pack years: 0.00    Types: Cigarettes    Quit date: 01/03/1969    Years since quitting: 50.7  . Smokeless tobacco: Never Used  Substance and Sexual Activity  . Alcohol use: No    Alcohol/week: 1.0 standard drink    Types: 1 Glasses of wine per week    Comment: rare wine. 1-2x/yr.  . Drug use: No  . Sexual activity: Not on file  Other Topics Concern  . Not on file  Social History Narrative   Has living will   Health care POA-- son Shanon Brow   Has DNR order from the past---form redone 06/25/10   Probably no feeding tube if cognitively unaware   Social Determinants of Health   Financial Resource Strain:   . Difficulty of Paying Living Expenses: Not on file  Food Insecurity:   . Worried About Charity fundraiser in the Last Year: Not on file  . Ran Out of Food in the Last Year: Not on file  Transportation Needs:   . Lack of Transportation (Medical): Not on file  . Lack  of Transportation (Non-Medical): Not on file  Physical Activity:   . Days of Exercise per Week: Not on file  . Minutes of Exercise per Session: Not on file  Stress:   . Feeling of Stress : Not on file  Social Connections:   . Frequency of Communication with Friends and Family: Not on file  . Frequency of Social Gatherings with Friends and Family: Not on file  . Attends Religious Services: Not on file  . Active Member of Clubs or Organizations: Not on file  . Attends Archivist Meetings: Not on file  . Marital Status: Not on file  Intimate Partner Violence:   . Fear of Current or Ex-Partner: Not on file  . Emotionally Abused: Not on file  . Physically Abused: Not on file  . Sexually Abused: Not on file    Past Surgical History:  Procedure Laterality Date  . APPENDECTOMY    . BELPHAROPTOSIS REPAIR     Dr [name]---didn't resolve weepy eye and eyelid drooping  . CARDIOVERSION  04/13/2010  . CATARACT EXTRACTION, BILATERAL  2009  . Chest pain  8/12   Stress test benign  . ESOPHAGEAL DILATION  05/26/2016   Procedure: ESOPHAGEAL DILATION;  Surgeon: Lucilla Lame, MD;  Location: [place];  Service: Endoscopy;;  . ESOPHAGOGASTRODUODENOSCOPY (EGD) WITH PROPOFOL N/A 05/26/2016   Procedure: ESOPHAGOGASTRODUODENOSCOPY (EGD) WITH PROPOFOL;  Surgeon: Lucilla Lame, MD;  Location: [place];  Service: Endoscopy;  Laterality: N/A;  Diabetic - oral meds sleep apnea  . EYE SURGERY    . FRACTURE SURGERY Left    elbow  . KNEE SURGERY  1998   plate after fracture, then removed for infection  . left elbow surgery    . MASTOIDECTOMY  8/08   Dr Idelle Crouch  . RHINOPLASTY  5/10   and septoplasty  . SUBACROMIAL DECOMPRESSION Right 2005   Arthroscopic (for rotator cuff and biceps tendon ruptures)  . TEAR DUCT PROBING  11/13   Dr Vickki Muff  . TOTAL KNEE ARTHROPLASTY Left 09/01/2014   Procedure: TOTAL KNEE ARTHROPLASTY;  Surgeon: Dereck Leep, MD;  Location: [facility];   Service: Orthopedics;  Laterality: Left;  . TRIGGER FINGER RELEASE Left 02/25/2015   Procedure: LEFT LONG TRIGGER RELEASE;  Surgeon: Dereck Leep, MD;  Location: [facility];  Service: Orthopedics;  Laterality: Left;  Marland Kitchen VENOUS ABLATION  Family History  Problem Relation Age of Onset  . Colon cancer Father   . Diabetes Father   . Hypertension Father   . Other Mother        natural causes  . Heart attack Neg Hx   . Stroke Neg Hx     Allergies  Allergen Reactions  . Enoxaparin Hives  . Enoxaparin Sodium Hives  . Latex Rash    Has trouble with BANDAIDS that have been left on for more than 24 hours. Prefers paper tape.  . Nickel Rash  . Percocet [Oxycodone-Acetaminophen] Rash  . Tape Rash and Other (See Comments)    Sensitivity        Assessment & Plan:   1. Generalized edema The patient's worsening edema most likely related to his fluid overload status. The patient has been on diuretics this has resolved.  Patient is advised to continue with utilizing medical grade 1 compression stockings in addition to elevation and exercise.  We will have the patient return in 3 months.  2. Essential hypertension Continue antihypertensive medications as already ordered, these medications have been reviewed and there are no changes at this time.   3. Venous stasis ulcer of calf limited to breakdown of skin, unspecified laterality, unspecified whether varicose veins present [facility]) This has resolved at this time   Current Outpatient Medications on File Prior to Visit  Medication Sig Dispense Refill  . APPLE CIDER VINEGAR PO Take by mouth.    Marland Kitchen atorvastatin (LIPITOR) 80 MG tablet Take 1 tablet (80 mg total) by mouth daily. 90 tablet 3  . cetirizine (ZYRTEC) 10 MG tablet Take 10 mg by mouth daily.    . Cyanocobalamin (B-12) 2500 MCG SUBL Place under the tongue.    . docusate sodium (COLACE) 100 MG capsule Take 100 mg by mouth 2 (two) times daily.     . famotidine (PEPCID) 20 MG tablet Take  20 mg by mouth daily.     . finasteride (PROSCAR) 5 MG tablet Take 1 tablet by mouth once daily 90 tablet 3  . glucose blood (BAYER CONTOUR TEST) test strip USE STRIP TO TEST BLOOD SUGAR ONCE DAILY. Dx Code: E11.40 100 each 3  . ibuprofen (ADVIL,MOTRIN) 200 MG tablet Take 400 mg by mouth 2 (two) times daily.     Marland Kitchen loratadine (CLARITIN) 10 MG tablet Take 10 mg by mouth daily.      . metFORMIN (GLUCOPHAGE) 500 MG tablet TAKE 1 TABLET BY MOUTH TWICE DAILY WITH MEALS 180 tablet 3  . Microlet Lancets MISC Use to test twice daily or as directed 200 each 3  . tamsulosin (FLOMAX) 0.4 MG CAPS capsule Take 1 capsule (0.4 mg total) by mouth daily. 90 capsule 2  . triamterene-hydrochlorothiazide (DYAZIDE) 37.5-25 MG capsule Take 1 capsule by mouth in the morning 90 capsule 3  . warfarin (COUMADIN) 5 MG tablet TAKE 1 TO 2 BY MOUTH AS  DIRECTED BY COUMADIN CLINIC 135 tablet 1   No current facility-administered medications on file prior to visit.    There are no Patient Instructions on file for this visit. No follow-ups on file.   Kris Hartmann, NP

## 2019-10-01 NOTE — Progress Notes (Signed)
Results released to mychart

## 2019-10-14 ENCOUNTER — Ambulatory Visit (INDEPENDENT_AMBULATORY_CARE_PROVIDER_SITE_OTHER): Payer: Medicare Other

## 2019-10-14 ENCOUNTER — Other Ambulatory Visit: Payer: Self-pay

## 2019-10-14 DIAGNOSIS — Z5181 Encounter for therapeutic drug level monitoring: Secondary | ICD-10-CM

## 2019-10-14 DIAGNOSIS — I4892 Unspecified atrial flutter: Secondary | ICD-10-CM

## 2019-10-14 LAB — POCT INR: INR: 2.1 (ref 2.0–3.0)

## 2019-10-14 NOTE — Patient Instructions (Signed)
-   continue warfarin dosage of 1 tablets daily except 1.5 tablet on SUNDAYS, TUESDAYS & THURSDAYS.   - Recheck in 3 weeks.   Please be consistent with your green intake - please pick a day or days each week to have your greens and have them every week on that day (your pills are on a schedule, so your greens need to be on one) 

## 2019-10-15 ENCOUNTER — Ambulatory Visit (INDEPENDENT_AMBULATORY_CARE_PROVIDER_SITE_OTHER): Payer: Medicare Other

## 2019-10-15 DIAGNOSIS — R0602 Shortness of breath: Secondary | ICD-10-CM | POA: Diagnosis not present

## 2019-10-15 LAB — ECHOCARDIOGRAM COMPLETE
Area-P 1/2: 2.86 cm2
S' Lateral: 2.8 cm

## 2019-10-17 ENCOUNTER — Telehealth: Payer: Self-pay | Admitting: *Deleted

## 2019-10-17 NOTE — Telephone Encounter (Signed)
The patient has been notified of the result and verbalized understanding.  All questions (if any) were answered.  However, he had complaint about the comments made in the report of the echo to dispute. He was offended when the echo report mentioned he was morbidly obese. He said that is "atrociously wrong and an absolute untruth." He reports the weight is 205 lb and he does not weigh 205 lb. Office visit: 09/30/19 said 205 lb   9/22 210 lb   9/16 235 lb  He demanded that the morbidly obese be taken out of his record. Advised that I will make Dr Rockey Situ and my manager aware of his concern and frustration. He is scheduled to see Dr Rockey Situ next week on 10/21/19.

## 2019-10-17 NOTE — Telephone Encounter (Signed)
-----   Message from Theora Gianotti, NP sent at 10/16/2019  5:37 PM EDT ----- Normal heart to supranormal squeezing function with thickening of areas of muscle.  Focus is on bp mgmt in this setting.  No significant valvular disease.  F/u with Dr. Rockey Situ as planned.

## 2019-10-19 NOTE — Progress Notes (Deleted)
Date:  10/19/2019   ID:  Michael Colon, DOB [birth year], MRN [id number]  Patient Location:  [place] [id number]   Provider location:   [facility], [place] office  PCP:  Michael Carbon, MD  Cardiologist:  [facility]  No chief complaint on file.    History of Present Illness:    Michael Colon is a 86 y.o. male  past medical history of CT calcium score 1300,  atrial flutter (3/14), previous ablation , on warfarin,  hypertension,  borderline diabetes,  Obstructive sleep apnea  [facility] on August 04, 2010 with chest pain.  Symptoms started after eating and it was felt he had GI spasm or hiatal hernia. Mild improvement in symptoms with nitroglycerin and GI cocktail in the emergency room. He is very active at baseline.  He reports having a TIA some time in 2014, workup negative at [facility] Previous vein ablation surgery at [facility] Underlying diabetes type 2 S/p  Colon great saphenous vein and small saphenous vein laser ablation. His postprocedural duplex showed successful ablation without DVT. He presents today for follow-up of his atrial flutter, hyperlipidemia  Wife in health care, anemia, weakness, myopathy He now lives alone  Labs reviewed with him in detail today HAB1C down to 6.5 Total chol 110, LDL 36  Weight down 10 pounds, eating less  Continues to have lower extremity edema bilaterally, trace pitting Wearing compression hose  Cortisone to knee, doing better  Chronic asymptomatic bradycardia rate in the 50s No orthostasis No regular exercise program  Vertigo  better  EKG personally reviewed by myself on todays visit Sinus bradycardia, rate 45  Other past medical history reviewed Chronic leg swelling worse on the left leg than the Colon secondary to previous knee surgeries   Prior CV studies:   The following studies were reviewed today:  Previously evaluated for bradycardia. Pacemaker was not recommended at that  time. Previously seen by Dr. Caryl Colon, EP  Previous Holter monitor showed normal sinus rhythm with pauses up to 2.86 seconds, bradycardia with heart rates in the 20s to 30s at nighttime, 30s to 50 during the daytime.  Hemoglobin A1c in December 2014 7.5, total cholesterol up from 132 now 200  CT calcium score 1300,  stress test 04/2015 Following the CT scan, no ischemia   Past Medical History:  Diagnosis Date  . Asthma   . Atrial flutter ([place]) 2007  . Band keratopathy   . Benign prostatic hypertrophy   . Chronic ear infection   . Chronic kidney disease   . Chronic prostatitis   . Dental bridge present    permanent - upper  . Diabetes mellitus   . Elbow stiffness, left    s/p fracture many yrs ago.  arm does not straighten  . GERD (gastroesophageal reflux disease)    laryngeal involvement  . Hyperlipidemia   . Hypertension   . Obstructive sleep apnea    CPAP-9  . Osteoarthrosis, localized, primary, knee    post-traumatic  . Prostatitis   . Stroke [facility])    TIA  . Tachy-brady syndrome ([place])   . UTI (urinary tract infection)    Past Surgical History:  Procedure Laterality Date  . APPENDECTOMY    . BELPHAROPTOSIS REPAIR     Dr Michael Colon---didn't resolve weepy eye and eyelid drooping  . CARDIOVERSION  04/13/2010  . CATARACT EXTRACTION, BILATERAL  2009  . Chest pain  8/12   Stress test benign  . ESOPHAGEAL DILATION  05/26/2016   Procedure: ESOPHAGEAL DILATION;  Surgeon: Michael Lame, MD;  Location: [place];  Service: Endoscopy;;  . ESOPHAGOGASTRODUODENOSCOPY (EGD) WITH PROPOFOL N/A 05/26/2016   Procedure: ESOPHAGOGASTRODUODENOSCOPY (EGD) WITH PROPOFOL;  Surgeon: Michael Lame, MD;  Location: [place];  Service: Endoscopy;  Laterality: N/A;  Diabetic - oral meds sleep apnea  . EYE SURGERY    . FRACTURE SURGERY Left    elbow  . KNEE SURGERY  1998   plate after fracture, then removed for infection  . left elbow surgery    . MASTOIDECTOMY  8/08   Dr  Michael Colon  . RHINOPLASTY  5/10   and septoplasty  . SUBACROMIAL DECOMPRESSION [place] 2005   Arthroscopic (for rotator cuff and biceps tendon ruptures)  . TEAR DUCT PROBING  11/13   Dr Michael Colon  . TOTAL KNEE ARTHROPLASTY Left 09/01/2014   Procedure: TOTAL KNEE ARTHROPLASTY;  Surgeon: Michael Leep, MD;  Location: [facility];  Service: Orthopedics;  Laterality: Left;  . TRIGGER FINGER RELEASE Left 02/25/2015   Procedure: LEFT LONG TRIGGER RELEASE;  Surgeon: Michael Leep, MD;  Location: [facility];  Service: Orthopedics;  Laterality: Left;  Marland Kitchen VENOUS ABLATION       No outpatient medications have been marked as taking for the 10/21/19 encounter (Appointment) with Michael Merritts, MD.     Allergies:   Enoxaparin, Enoxaparin sodium, Latex, Nickel, Percocet [oxycodone-acetaminophen], and Tape   Social History   Tobacco Use  . Smoking status: Former Smoker    Packs/day: 1.00    Years: 0.00    Pack years: 0.00    Types: Cigarettes    Quit date: 01/03/1969    Years since quitting: 50.8  . Smokeless tobacco: Never Used  Substance Use Topics  . Alcohol use: No    Alcohol/week: 1.0 standard drink    Types: 1 Glasses of wine per week    Comment: rare wine. 1-2x/yr.  . Drug use: No     Current Outpatient Medications on File Prior to Visit  Medication Sig Dispense Refill  . APPLE CIDER VINEGAR PO Take by mouth.    Marland Kitchen atorvastatin (LIPITOR) 80 MG tablet Take 1 tablet (80 mg total) by mouth daily. 90 tablet 3  . cetirizine (ZYRTEC) 10 MG tablet Take 10 mg by mouth daily.    . Cyanocobalamin (B-12) 2500 MCG SUBL Place under the tongue.    . docusate sodium (COLACE) 100 MG capsule Take 100 mg by mouth 2 (two) times daily.     . famotidine (PEPCID) 20 MG tablet Take 20 mg by mouth daily.     . finasteride (PROSCAR) 5 MG tablet Take 1 tablet by mouth once daily 90 tablet 3  . furosemide (LASIX) 20 MG tablet Take 1 tablet (20 mg total) by mouth daily. 90 tablet 1  . glucose blood (BAYER CONTOUR  TEST) test strip USE STRIP TO TEST BLOOD SUGAR ONCE DAILY. Dx Code: E11.40 100 each 3  . ibuprofen (ADVIL,MOTRIN) 200 MG tablet Take 400 mg by mouth 2 (two) times daily.     Marland Kitchen loratadine (CLARITIN) 10 MG tablet Take 10 mg by mouth daily.      . metFORMIN (GLUCOPHAGE) 500 MG tablet TAKE 1 TABLET BY MOUTH TWICE DAILY WITH MEALS 180 tablet 3  . Microlet Lancets MISC Use to test twice daily or as directed 200 each 3  . tamsulosin (FLOMAX) 0.4 MG CAPS capsule Take 1 capsule (0.4 mg total) by mouth daily. 90 capsule 2  . triamterene-hydrochlorothiazide (  DYAZIDE) 37.5-25 MG capsule Take 1 capsule by mouth in the morning 90 capsule 3  . warfarin (COUMADIN) 5 MG tablet TAKE 1 TO 2 BY MOUTH AS  DIRECTED BY COUMADIN CLINIC 135 tablet 1   No current facility-administered medications on file prior to visit.     Family Hx: The patient's family history includes Colon cancer in his father; Diabetes in his father; Hypertension in his father; Other in his mother. There is no history of Heart attack or Stroke.  ROS:   Please see the history of present illness.    Review of Systems  Constitutional: Negative.   HENT: Negative.   Respiratory: Negative.   Cardiovascular: Negative.   Gastrointestinal: Negative.   Musculoskeletal: Negative.   Neurological: Negative.   Psychiatric/Behavioral: Negative.   All other systems reviewed and are negative.    Labs/Other Tests and Data Reviewed:    Recent Labs: 09/16/2019: ALT 30; Hemoglobin 13.9; Platelets 102.0; Pro B Natriuretic peptide (BNP) 270.0 09/30/2019: BUN 42; Creatinine, Ser 1.01; Potassium 3.7; Sodium 140   Recent Lipid Panel Lab Results  Component Value Date/Time   CHOL 110 02/15/2019 10:04 AM   TRIG 46.0 02/15/2019 10:04 AM   HDL 64.60 02/15/2019 10:04 AM   CHOLHDL 2 02/15/2019 10:04 AM   LDLCALC 37 02/15/2019 10:04 AM    Wt Readings from Last 3 Encounters:  09/30/19 205 lb 8 oz (93.2 kg)  09/25/19 210 lb 9.6 oz (95.5 kg)  09/19/19 234 lb  (106.1 kg)     Exam:    There were no vitals taken for this visit. Constitutional:  oriented to person, place, and time. No distress.  HENT:  Head: Grossly normal Eyes:  no discharge. No scleral icterus.  Neck: No JVD, no carotid bruits  Cardiovascular: Regular rate and rhythm, no murmurs appreciated Trace pitting lower extremity edema around the ankles Pulmonary/Chest: Clear to auscultation bilaterally, no wheezes or rails Abdominal: Soft.  no distension.  no tenderness.  Musculoskeletal: Normal range of motion Neurological:  normal muscle tone. Coordination normal. No atrophy Skin: Skin warm and dry Psychiatric: normal affect, pleasant   ASSESSMENT & PLAN:    Atrial fibrillation/flutter On anticoagulation, asymptomatic Maintaining sinus bradycardia  Tachy-brady syndrome (HCC) Stable Asymptomatic from his bradycardia, long discussion with him  Mixed hyperlipidemia Cholesterol is at goal on the current lipid regimen. No changes to the medications were made.  Stable  Essential hypertension Blood pressure is well controlled on today's visit. No changes made to the medications.  Stable  Type 2 diabetes, controlled, with neuropathy ([place]) Dramatic improvement in his diabetes numbers,  Weight down  Aortic atherosclerosis (HCC) Cholesterol is at goal on the current lipid regimen. No changes to the medications were made.  Obstructive sleep apnea On cpap, weight down Followed by pulmonary  Time:   Today, I have spent 25 minutes with the patient with telehealth technology discussing vertigo, atrial fibrillation/flutter, anticoagulation, diabetes and lipid control.     Medication Adjustments/Labs and Tests Ordered: Current medicines are reviewed at length with the patient today.  Concerns regarding medicines are outlined above.   Tests Ordered: No tests ordered   Medication Changes: No changes made   Disposition: Follow-up in 6 months   Signed, Ida Rogue,  MD  10/19/2019 9:26 PM    [place] Office [address], [place], [place] [address]

## 2019-10-20 NOTE — Telephone Encounter (Signed)
Not on his list currently There was a time prior to his recent weight loss that he may have been classified as morbidly obese with BMI >35 and underlying medical issues (HTN and diabetes), But he has lost weight since then. He does not meet the criteria for morbidly obese at this time

## 2019-10-21 ENCOUNTER — Ambulatory Visit: Payer: Medicare Other | Admitting: Cardiovascular Disease

## 2019-10-21 DIAGNOSIS — I483 Typical atrial flutter: Secondary | ICD-10-CM

## 2019-10-21 DIAGNOSIS — R001 Bradycardia, unspecified: Secondary | ICD-10-CM

## 2019-10-21 DIAGNOSIS — E782 Mixed hyperlipidemia: Secondary | ICD-10-CM

## 2019-10-21 DIAGNOSIS — I700 Atherosclerosis of aorta: Secondary | ICD-10-CM

## 2019-10-21 DIAGNOSIS — I10 Essential (primary) hypertension: Secondary | ICD-10-CM

## 2019-10-21 DIAGNOSIS — I2510 Atherosclerotic heart disease of native coronary artery without angina pectoris: Secondary | ICD-10-CM

## 2019-10-21 NOTE — Telephone Encounter (Signed)
Spoke with patient and reviewed provider information. Reviewed chart and explained that I do not see diagnosis on his list. He was appreciative for the call back with no further questions at this time.

## 2019-10-22 NOTE — Progress Notes (Deleted)
Date:  10/22/2019   ID:  [name], DOB 04-01-1930, MRN [id number]  Patient Location:  [place] [id number]   Provider location:   [facility], [place] office  PCP:  Venia Carbon, MD  Cardiologist:  [facility]  No chief complaint on file.    History of Present Illness:    Michael Colon is a 86 y.o. male  past medical history of CT calcium score 1300,  atrial flutter (3/14), previous ablation , on warfarin,  hypertension,  borderline diabetes,  Obstructive sleep apnea  [facility] on August 04, 2010 with chest pain.  Symptoms started after eating and it was felt he had GI spasm or hiatal hernia. Mild improvement in symptoms with nitroglycerin and GI cocktail in the emergency room. He is very active at baseline.  He reports having a TIA some time in 2014, workup negative at [facility] Previous vein ablation surgery at [facility] Underlying diabetes type 2 S/p  right great saphenous vein and small saphenous vein laser ablation. His postprocedural duplex showed successful ablation without DVT. He presents today for follow-up of his atrial flutter, hyperlipidemia   Wife in health care, anemia, weakness, myopathy He now lives alone  Last seen by myself October 2020 Was seen in September 19 2019 had fluid retention, weight gain Lasix up to 60 daily  Follow-up visit September 30, 2019 Weight was down significantly Climbing BUN from 19 up to 42 Lasix dosing decreased, taking 20 daily  echocardiogram October 2021 Normal ejection fraction asymmetric left ventricular  hypertrophy of the basal-septal segment Aorta 38 mm Mildly elevated right heart pressures    Labs reviewed with him in detail today HAB1C down to 6.5 Total chol 110, LDL 36  Weight down 10 pounds, eating less  Continues to have lower extremity edema bilaterally, trace pitting Wearing compression hose  Cortisone to knee, doing better  Chronic asymptomatic bradycardia  rate in the 50s No orthostasis No regular exercise program  Vertigo  better  EKG personally reviewed by myself on todays visit Sinus bradycardia, rate 45  Other past medical history reviewed Chronic leg swelling worse on the left leg than the right secondary to previous knee surgeries   Prior CV studies:   The following studies were reviewed today:  Previously evaluated for bradycardia. Pacemaker was not recommended at that time. Previously seen by Dr. Caryl Comes, EP  Previous Holter monitor showed normal sinus rhythm with pauses up to 2.86 seconds, bradycardia with heart rates in the 20s to 30s at nighttime, 30s to 50 during the daytime.  Hemoglobin A1c in December 2014 7.5, total cholesterol up from 132 now 200  CT calcium score 1300,  stress test 04/2015 Following the CT scan, no ischemia   Past Medical History:  Diagnosis Date  . Asthma   . Atrial flutter ([place]) 2007  . Band keratopathy   . Benign prostatic hypertrophy   . Chronic ear infection   . Chronic kidney disease   . Chronic prostatitis   . Dental bridge present    permanent - upper  . Diabetes mellitus   . Elbow stiffness, left    s/p fracture many yrs ago.  arm does not straighten  . GERD (gastroesophageal reflux disease)    laryngeal involvement  . Hyperlipidemia   . Hypertension   . Obstructive sleep apnea    CPAP-9  . Osteoarthrosis, localized, primary, knee    post-traumatic  . Prostatitis   . Stroke [facility])  TIA  . Tachy-brady syndrome ([place])   . UTI (urinary tract infection)    Past Surgical History:  Procedure Laterality Date  . APPENDECTOMY    . BELPHAROPTOSIS REPAIR     Dr [name]---didn't resolve weepy eye and eyelid drooping  . CARDIOVERSION  04/13/2010  . CATARACT EXTRACTION, BILATERAL  2009  . Chest pain  8/12   Stress test benign  . ESOPHAGEAL DILATION  05/26/2016   Procedure: ESOPHAGEAL DILATION;  Surgeon: Lucilla Lame, MD;  Location: [place];  Service: Endoscopy;;  .  ESOPHAGOGASTRODUODENOSCOPY (EGD) WITH PROPOFOL N/A 05/26/2016   Procedure: ESOPHAGOGASTRODUODENOSCOPY (EGD) WITH PROPOFOL;  Surgeon: Lucilla Lame, MD;  Location: [place];  Service: Endoscopy;  Laterality: N/A;  Diabetic - oral meds sleep apnea  . EYE SURGERY    . FRACTURE SURGERY Left    elbow  . KNEE SURGERY  1998   plate after fracture, then removed for infection  . left elbow surgery    . MASTOIDECTOMY  8/08   Dr Idelle Crouch  . RHINOPLASTY  5/10   and septoplasty  . SUBACROMIAL DECOMPRESSION Right 2005   Arthroscopic (for rotator cuff and biceps tendon ruptures)  . TEAR DUCT PROBING  11/13   Dr Vickki Muff  . TOTAL KNEE ARTHROPLASTY Left 09/01/2014   Procedure: TOTAL KNEE ARTHROPLASTY;  Surgeon: Dereck Leep, MD;  Location: [facility];  Service: Orthopedics;  Laterality: Left;  . TRIGGER FINGER RELEASE Left 02/25/2015   Procedure: LEFT LONG TRIGGER RELEASE;  Surgeon: Dereck Leep, MD;  Location: [facility];  Service: Orthopedics;  Laterality: Left;  Marland Kitchen VENOUS ABLATION       No outpatient medications have been marked as taking for the 10/23/19 encounter (Appointment) with Minna Merritts, MD.     Allergies:   Enoxaparin, Enoxaparin sodium, Latex, Nickel, Percocet [oxycodone-acetaminophen], and Tape   Social History   Tobacco Use  . Smoking status: Former Smoker    Packs/day: 1.00    Years: 0.00    Pack years: 0.00    Types: Cigarettes    Quit date: 01/03/1969    Years since quitting: 50.8  . Smokeless tobacco: Never Used  Substance Use Topics  . Alcohol use: No    Alcohol/week: 1.0 standard drink    Types: 1 Glasses of wine per week    Comment: rare wine. 1-2x/yr.  . Drug use: No     Current Outpatient Medications on File Prior to Visit  Medication Sig Dispense Refill  . APPLE CIDER VINEGAR PO Take by mouth.    Marland Kitchen atorvastatin (LIPITOR) 80 MG tablet Take 1 tablet (80 mg total) by mouth daily. 90 tablet 3  . cetirizine (ZYRTEC) 10 MG tablet Take 10 mg by mouth  daily.    . Cyanocobalamin (B-12) 2500 MCG SUBL Place under the tongue.    . docusate sodium (COLACE) 100 MG capsule Take 100 mg by mouth 2 (two) times daily.     . famotidine (PEPCID) 20 MG tablet Take 20 mg by mouth daily.     . finasteride (PROSCAR) 5 MG tablet Take 1 tablet by mouth once daily 90 tablet 3  . furosemide (LASIX) 20 MG tablet Take 1 tablet (20 mg total) by mouth daily. 90 tablet 1  . glucose blood (BAYER CONTOUR TEST) test strip USE STRIP TO TEST BLOOD SUGAR ONCE DAILY. Dx Code: E11.40 100 each 3  . ibuprofen (ADVIL,MOTRIN) 200 MG tablet Take 400 mg by mouth 2 (two) times daily.     Marland Kitchen loratadine (  CLARITIN) 10 MG tablet Take 10 mg by mouth daily.      . metFORMIN (GLUCOPHAGE) 500 MG tablet TAKE 1 TABLET BY MOUTH TWICE DAILY WITH MEALS 180 tablet 3  . Microlet Lancets MISC Use to test twice daily or as directed 200 each 3  . tamsulosin (FLOMAX) 0.4 MG CAPS capsule Take 1 capsule (0.4 mg total) by mouth daily. 90 capsule 2  . triamterene-hydrochlorothiazide (DYAZIDE) 37.5-25 MG capsule Take 1 capsule by mouth in the morning 90 capsule 3  . warfarin (COUMADIN) 5 MG tablet TAKE 1 TO 2 BY MOUTH AS  DIRECTED BY COUMADIN CLINIC 135 tablet 1   No current facility-administered medications on file prior to visit.     Family Hx: The patient's family history includes Colon cancer in his father; Diabetes in his father; Hypertension in his father; Other in his mother. There is no history of Heart attack or Stroke.  ROS:   Please see the history of present illness.    Review of Systems  Constitutional: Negative.   HENT: Negative.   Respiratory: Negative.   Cardiovascular: Negative.   Gastrointestinal: Negative.   Musculoskeletal: Negative.   Neurological: Negative.   Psychiatric/Behavioral: Negative.   All other systems reviewed and are negative.    Labs/Other Tests and Data Reviewed:    Recent Labs: 09/16/2019: ALT 30; Hemoglobin 13.9; Platelets 102.0; Pro B Natriuretic  peptide (BNP) 270.0 09/30/2019: BUN 42; Creatinine, Ser 1.01; Potassium 3.7; Sodium 140   Recent Lipid Panel Lab Results  Component Value Date/Time   CHOL 110 02/15/2019 10:04 AM   TRIG 46.0 02/15/2019 10:04 AM   HDL 64.60 02/15/2019 10:04 AM   CHOLHDL 2 02/15/2019 10:04 AM   LDLCALC 37 02/15/2019 10:04 AM    Wt Readings from Last 3 Encounters:  09/30/19 205 lb 8 oz (93.2 kg)  09/25/19 210 lb 9.6 oz (95.5 kg)  09/19/19 234 lb (106.1 kg)     Exam:    There were no vitals taken for this visit. Constitutional:  oriented to person, place, and time. No distress.  HENT:  Head: Grossly normal Eyes:  no discharge. No scleral icterus.  Neck: No JVD, no carotid bruits  Cardiovascular: Regular rate and rhythm, no murmurs appreciated Trace pitting lower extremity edema around the ankles Pulmonary/Chest: Clear to auscultation bilaterally, no wheezes or rails Abdominal: Soft.  no distension.  no tenderness.  Musculoskeletal: Normal range of motion Neurological:  normal muscle tone. Coordination normal. No atrophy Skin: Skin warm and dry Psychiatric: normal affect, pleasant   ASSESSMENT & PLAN:    Atrial fibrillation/flutter On anticoagulation, asymptomatic Maintaining sinus bradycardia  Tachy-brady syndrome (HCC) Stable Asymptomatic from his bradycardia, long discussion with him  Mixed hyperlipidemia Cholesterol is at goal on the current lipid regimen. No changes to the medications were made.  Stable  Essential hypertension Blood pressure is well controlled on today's visit. No changes made to the medications.  Stable  Type 2 diabetes, controlled, with neuropathy ([place]) Dramatic improvement in his diabetes numbers,  Weight down  Aortic atherosclerosis (HCC) Cholesterol is at goal on the current lipid regimen. No changes to the medications were made.  Obstructive sleep apnea On cpap, weight down Followed by pulmonary  Time:   Today, I have spent 25 minutes with the  patient with telehealth technology discussing vertigo, atrial fibrillation/flutter, anticoagulation, diabetes and lipid control.     Medication Adjustments/Labs and Tests Ordered: Current medicines are reviewed at length with the patient today.  Concerns regarding medicines are outlined  above.   Tests Ordered: No tests ordered   Medication Changes: No changes made   Disposition: Follow-up in 6 months   Signed, Ida Rogue, MD  10/22/2019 8:30 AM    [facility] [id number], [place], [place] [address]

## 2019-10-23 ENCOUNTER — Ambulatory Visit: Payer: Medicare Other | Admitting: Cardiovascular Disease

## 2019-10-25 ENCOUNTER — Ambulatory Visit (INDEPENDENT_AMBULATORY_CARE_PROVIDER_SITE_OTHER): Payer: Medicare Other | Admitting: Internal Medicine

## 2019-10-25 ENCOUNTER — Encounter: Payer: Self-pay | Admitting: Internal Medicine

## 2019-10-25 ENCOUNTER — Other Ambulatory Visit: Payer: Self-pay | Admitting: Internal Medicine

## 2019-10-25 ENCOUNTER — Other Ambulatory Visit: Payer: Self-pay

## 2019-10-25 VITALS — BP 112/62 | HR 84 | Temp 97.6°F | Ht 70.0 in | Wt 210.0 lb

## 2019-10-25 DIAGNOSIS — I5032 Chronic diastolic (congestive) heart failure: Secondary | ICD-10-CM | POA: Insufficient documentation

## 2019-10-25 DIAGNOSIS — I2510 Atherosclerotic heart disease of native coronary artery without angina pectoris: Secondary | ICD-10-CM

## 2019-10-25 LAB — RENAL FUNCTION PANEL
Albumin: 3.9 g/dL (ref 3.5–5.2)
BUN: 31 mg/dL — ABNORMAL HIGH (ref 6–23)
CO2: 32 mEq/L (ref 19–32)
Calcium: 10.5 mg/dL (ref 8.4–10.5)
Chloride: 101 mEq/L (ref 96–112)
Creatinine, Ser: 0.94 mg/dL (ref 0.40–1.50)
GFR: 73.18 mL/min (ref >60.00)
Glucose, Bld: 187 mg/dL — ABNORMAL HIGH (ref 70–99)
Phosphorus: 2.5 mg/dL (ref 2.3–4.6)
Potassium: 3.8 mEq/L (ref 3.5–5.1)
Sodium: 139 mEq/L (ref 135–145)

## 2019-10-25 NOTE — Progress Notes (Signed)
Subjective:    Patient ID: Michael Colon, male    DOB: [birth year], 86 y.o.   MRN: [id number]  HPI Here for follow up of CHF This visit occurred during the SARS-CoV-2 public health emergency.  Safety protocols were in place, including screening questions prior to the visit, additional usage of staff PPE, and extensive cleaning of exam room while observing appropriate contact time as indicated for disinfecting solutions.   Feels pretty good Feels better since the edema is gone Using the furosemide --but only 20mg  daily Breathing is good Still feels deconditioned---is working with bicycle and walking No chest pain  Current Outpatient Medications on File Prior to Visit  Medication Sig Dispense Refill  . APPLE CIDER VINEGAR PO Take by mouth.    Marland Kitchen atorvastatin (LIPITOR) 80 MG tablet Take 1 tablet (80 mg total) by mouth daily. 90 tablet 3  . cetirizine (ZYRTEC) 10 MG tablet Take 10 mg by mouth daily.    . Cyanocobalamin (B-12) 2500 MCG SUBL Place under the tongue.    . docusate sodium (COLACE) 100 MG capsule Take 100 mg by mouth 2 (two) times daily.     . famotidine (PEPCID) 20 MG tablet Take 20 mg by mouth daily.     . finasteride (PROSCAR) 5 MG tablet Take 1 tablet by mouth once daily 90 tablet 3  . furosemide (LASIX) 20 MG tablet Take 1 tablet (20 mg total) by mouth daily. 90 tablet 1  . glucose blood (BAYER CONTOUR TEST) test strip USE STRIP TO TEST BLOOD SUGAR ONCE DAILY. Dx Code: E11.40 100 each 3  . ibuprofen (ADVIL,MOTRIN) 200 MG tablet Take 400 mg by mouth 2 (two) times daily.     Marland Kitchen loratadine (CLARITIN) 10 MG tablet Take 10 mg by mouth daily.      . metFORMIN (GLUCOPHAGE) 500 MG tablet TAKE 1 TABLET BY MOUTH TWICE DAILY WITH MEALS 180 tablet 3  . Microlet Lancets MISC Use to test twice daily or as directed 200 each 3  . tamsulosin (FLOMAX) 0.4 MG CAPS capsule Take 1 capsule (0.4 mg total) by mouth daily. 90 capsule 2  . triamterene-hydrochlorothiazide (DYAZIDE) 37.5-25 MG  capsule Take 1 capsule by mouth in the morning 90 capsule 3  . warfarin (COUMADIN) 5 MG tablet TAKE 1 TO 2 BY MOUTH AS  DIRECTED BY COUMADIN CLINIC 135 tablet 1   No current facility-administered medications on file prior to visit.    Allergies  Allergen Reactions  . Enoxaparin Hives  . Enoxaparin Sodium Hives  . Latex Rash    Has trouble with BANDAIDS that have been left on for more than 24 hours. Prefers paper tape.  . Nickel Rash  . Percocet [Oxycodone-Acetaminophen] Rash  . Tape Rash and Other (See Comments)    Sensitivity     Past Medical History:  Diagnosis Date  . Asthma   . Atrial flutter ([place]) 2007  . Band keratopathy   . Benign prostatic hypertrophy   . Chronic ear infection   . Chronic kidney disease   . Chronic prostatitis   . Dental bridge present    permanent - upper  . Diabetes mellitus   . Elbow stiffness, left    s/p fracture many yrs ago.  arm does not straighten  . GERD (gastroesophageal reflux disease)    laryngeal involvement  . Hyperlipidemia   . Hypertension   . Obstructive sleep apnea    CPAP-9  . Osteoarthrosis, localized, primary, knee    post-traumatic  . Prostatitis   .  Stroke [facility])    TIA  . Tachy-brady syndrome ([place])   . UTI (urinary tract infection)     Past Surgical History:  Procedure Laterality Date  . APPENDECTOMY    . BELPHAROPTOSIS REPAIR     Dr [name]---didn't resolve weepy eye and eyelid drooping  . CARDIOVERSION  04/13/2010  . CATARACT EXTRACTION, BILATERAL  2009  . Chest pain  8/12   Stress test benign  . ESOPHAGEAL DILATION  05/26/2016   Procedure: ESOPHAGEAL DILATION;  Surgeon: Lucilla Lame, MD;  Location: [place];  Service: Endoscopy;;  . ESOPHAGOGASTRODUODENOSCOPY (EGD) WITH PROPOFOL N/A 05/26/2016   Procedure: ESOPHAGOGASTRODUODENOSCOPY (EGD) WITH PROPOFOL;  Surgeon: Lucilla Lame, MD;  Location: [place];  Service: Endoscopy;  Laterality: N/A;  Diabetic - oral meds sleep apnea  . EYE SURGERY     . FRACTURE SURGERY Left    elbow  . KNEE SURGERY  1998   plate after fracture, then removed for infection  . left elbow surgery    . MASTOIDECTOMY  8/08   Dr Idelle Crouch  . RHINOPLASTY  5/10   and septoplasty  . SUBACROMIAL DECOMPRESSION Right 2005   Arthroscopic (for rotator cuff and biceps tendon ruptures)  . TEAR DUCT PROBING  11/13   Dr Vickki Muff  . TOTAL KNEE ARTHROPLASTY Left 09/01/2014   Procedure: TOTAL KNEE ARTHROPLASTY;  Surgeon: Dereck Leep, MD;  Location: [facility];  Service: Orthopedics;  Laterality: Left;  . TRIGGER FINGER RELEASE Left 02/25/2015   Procedure: LEFT LONG TRIGGER RELEASE;  Surgeon: Dereck Leep, MD;  Location: [facility];  Service: Orthopedics;  Laterality: Left;  Marland Kitchen VENOUS ABLATION      Family History  Problem Relation Age of Onset  . Colon cancer Father   . Diabetes Father   . Hypertension Father   . Other Mother        natural causes  . Heart attack Neg Hx   . Stroke Neg Hx     Social History   Socioeconomic History  . Marital status: Widowed    Spouse name: Not on file  . Number of children: 2  . Years of education: Not on file  . Highest education level: Not on file  Occupational History  . Occupation: Theme park manager    Comment: Retired--[employer]  Tobacco Use  . Smoking status: Former Smoker    Packs/day: 1.00    Years: 0.00    Pack years: 0.00    Types: Cigarettes    Quit date: 01/03/1969    Years since quitting: 50.8  . Smokeless tobacco: Never Used  Substance and Sexual Activity  . Alcohol use: No    Alcohol/week: 1.0 standard drink    Types: 1 Glasses of wine per week    Comment: rare wine. 1-2x/yr.  . Drug use: No  . Sexual activity: Not on file  Other Topics Concern  . Not on file  Social History Narrative   Has living will   Health care POA-- son Shanon Brow   Has DNR order from the past---form redone 06/25/10   Probably no feeding tube if cognitively unaware   Social Determinants of Health   Financial Resource Strain:   .  Difficulty of Paying Living Expenses: Not on file  Food Insecurity:   . Worried About Charity fundraiser in the Last Year: Not on file  . Ran Out of Food in the Last Year: Not on file  Transportation Needs:   . Lack of Transportation (Medical): Not on file  .  Lack of Transportation (Non-Medical): Not on file  Physical Activity:   . Days of Exercise per Week: Not on file  . Minutes of Exercise per Session: Not on file  Stress:   . Feeling of Stress : Not on file  Social Connections:   . Frequency of Communication with Friends and Family: Not on file  . Frequency of Social Gatherings with Friends and Family: Not on file  . Attends Religious Services: Not on file  . Active Member of Clubs or Organizations: Not on file  . Attends Archivist Meetings: Not on file  . Marital Status: Not on file  Intimate Partner Violence:   . Fear of Current or Ex-Partner: Not on file  . Emotionally Abused: Not on file  . Physically Abused: Not on file  . Sexually Abused: Not on file   Review of Systems Notices some hoarseness--and clears his throat a lot Did have check of parathyroid gland before moving here Appetite is fine    Objective:   Physical Exam Constitutional:      Appearance: Normal appearance.  Cardiovascular:     Rate and Rhythm: Normal rate and regular rhythm.     Heart sounds: No murmur heard.  No gallop.   Pulmonary:     Effort: Pulmonary effort is normal.     Breath sounds: Normal breath sounds. No wheezing or rales.  Musculoskeletal:     Cervical back: Neck supple.     Right lower leg: No edema.     Left lower leg: No edema.  Lymphadenopathy:     Cervical: No cervical adenopathy.  Neurological:     Mental Status: He is alert.  Psychiatric:        Mood and Affect: Mood normal.        Behavior: Behavior normal.            Assessment & Plan:

## 2019-10-25 NOTE — Assessment & Plan Note (Signed)
Doing better with low dose furosemide Rate is fine now

## 2019-10-25 NOTE — Assessment & Plan Note (Signed)
Will check again with PTH

## 2019-10-25 NOTE — Progress Notes (Signed)
Date:  10/28/2019   ID:  Michael Colon, DOB 09-26-1930, MRN [id number]  Patient Location:  [place] [address]   Provider location:   [facility], [place] office  PCP:  Venia Carbon, MD  Cardiologist:  [facility]  Chief Complaint  Patient presents with  . Other    Follow up post ECHO and Lower Extremity. Meds reviewed verbally with patient.     History of Present Illness:    Michael Colon is a 86 y.o. male  past medical history of CT calcium score 1300,  atrial flutter (3/14), previous ablation , on warfarin,  hypertension,  borderline diabetes,  Obstructive sleep apnea  [facility] on August 04, 2010 with chest pain.  Symptoms started after eating and it was felt he had GI spasm or hiatal hernia. Mild improvement in symptoms with nitroglycerin and GI cocktail in the emergency room. He is very active at baseline.  He reports having a TIA some time in 2014, workup negative at [facility] Previous vein ablation surgery at [facility] Underlying diabetes type 2 S/p  right great saphenous vein and small saphenous vein laser ablation. His postprocedural duplex showed successful ablation without DVT. He presents today for follow-up of his atrial flutter, hyperlipidemia  Lost his wife in the past year, dealing with grief Reports other people come to him to talk about grief given his insight  His biggest concern is why he gained 20 pounds of weight in the past several months which is now down to his baseline  Last seen by myself October 2020 Was seen in September 19 2019 had fluid retention, weight gain Lasix titrated up to 60 daily He denied drinking excess fluid or eating out or any change in his diet  Review of his recent weights as below Weight 219 08/20/19 09/05/19: placed in unna wraps 09/16/19: weight 244 (etiology unclear) Weight on 09/19/19:  235 In office: 09/30/19: weight 205 Scale at home: 205 today Weight today: 212  Follow-up visit  September 30, 2019 Weight was down significantly Climbing BUN from 19 up to 42 Lasix dosing decreased, taking 20 daily  echocardiogram October 2021 Normal ejection fraction asymmetric left ventricular  hypertrophy of the basal-septal segment Aorta 38 mm Mildly elevated right heart pressures  Lab work reviewed most recent creatinine 0.94 BUN 31, improvement from September BUN 42 creatinine 1.01  Leg edema is trace "standing tough on me",    Chronic asymptomatic bradycardia rate in the 50s No orthostasis No regular exercise program  labs HAB1C down to 6.5 Total chol 110, LDL 36  EKG personally reviewed by myself on todays visit Sinus bradycardia, rate 65 1deg Av block  Other past medical history reviewed Chronic leg swelling worse on the left leg than the right secondary to previous knee surgeries   Prior CV studies:   The following studies were reviewed today:  Previously evaluated for bradycardia. Pacemaker was not recommended at that time. Previously seen by Dr. Caryl Comes, EP  Previous Holter monitor showed normal sinus rhythm with pauses up to 2.86 seconds, bradycardia with heart rates in the 20s to 30s at nighttime, 30s to 50 during the daytime.  Hemoglobin A1c in December 2014 7.5, total cholesterol up from 132 now 200  CT calcium score 1300,  stress test 04/2015 Following the CT scan, no ischemia   Past Medical History:  Diagnosis Date  . Asthma   . Atrial flutter ([place]) 2007  . Band keratopathy   .  Benign prostatic hypertrophy   . Chronic ear infection   . Chronic kidney disease   . Chronic prostatitis   . Dental bridge present    permanent - upper  . Diabetes mellitus   . Elbow stiffness, left    s/p fracture many yrs ago.  arm does not straighten  . GERD (gastroesophageal reflux disease)    laryngeal involvement  . Hyperlipidemia   . Hypertension   . Obstructive sleep apnea    CPAP-9  . Osteoarthrosis, localized, primary, knee    post-traumatic   . Prostatitis   . Stroke [facility])    TIA  . Tachy-brady syndrome ([place])   . UTI (urinary tract infection)    Past Surgical History:  Procedure Laterality Date  . APPENDECTOMY    . BELPHAROPTOSIS REPAIR     Dr [name]---didn't resolve weepy eye and eyelid drooping  . CARDIOVERSION  04/13/2010  . CATARACT EXTRACTION, BILATERAL  2009  . Chest pain  8/12   Stress test benign  . ESOPHAGEAL DILATION  05/26/2016   Procedure: ESOPHAGEAL DILATION;  Surgeon: Lucilla Lame, MD;  Location: [place];  Service: Endoscopy;;  . ESOPHAGOGASTRODUODENOSCOPY (EGD) WITH PROPOFOL N/A 05/26/2016   Procedure: ESOPHAGOGASTRODUODENOSCOPY (EGD) WITH PROPOFOL;  Surgeon: Lucilla Lame, MD;  Location: [place];  Service: Endoscopy;  Laterality: N/A;  Diabetic - oral meds sleep apnea  . EYE SURGERY    . FRACTURE SURGERY Left    elbow  . KNEE SURGERY  1998   plate after fracture, then removed for infection  . left elbow surgery    . MASTOIDECTOMY  8/08   Dr Idelle Crouch  . RHINOPLASTY  5/10   and septoplasty  . SUBACROMIAL DECOMPRESSION Right 2005   Arthroscopic (for rotator cuff and biceps tendon ruptures)  . TEAR DUCT PROBING  11/13   Dr Vickki Muff  . TOTAL KNEE ARTHROPLASTY Left 09/01/2014   Procedure: TOTAL KNEE ARTHROPLASTY;  Surgeon: Dereck Leep, MD;  Location: [facility] ORS;  Service: Orthopedics;  Laterality: Left;  . TRIGGER FINGER RELEASE Left 02/25/2015   Procedure: LEFT LONG TRIGGER RELEASE;  Surgeon: Dereck Leep, MD;  Location: [facility] ORS;  Service: Orthopedics;  Laterality: Left;  Marland Kitchen VENOUS ABLATION       Current Meds  Medication Sig  . APPLE CIDER VINEGAR PO Take by mouth.  Marland Kitchen atorvastatin (LIPITOR) 80 MG tablet Take 1 tablet (80 mg total) by mouth daily.  . cetirizine (ZYRTEC) 10 MG tablet Take 10 mg by mouth daily.  . Cyanocobalamin (B-12) 2500 MCG SUBL Place under the tongue.  . docusate sodium (COLACE) 100 MG capsule Take 100 mg by mouth 2 (two) times daily.   . famotidine  (PEPCID) 20 MG tablet Take 20 mg by mouth daily.   . finasteride (PROSCAR) 5 MG tablet Take 1 tablet by mouth once daily  . furosemide (LASIX) 20 MG tablet Take 1 tablet (20 mg total) by mouth daily.  Marland Kitchen glucose blood (BAYER CONTOUR TEST) test strip USE STRIP TO TEST BLOOD SUGAR ONCE DAILY. Dx Code: E11.40  . ibuprofen (ADVIL,MOTRIN) 200 MG tablet Take 400 mg by mouth 2 (two) times daily.   Marland Kitchen loratadine (CLARITIN) 10 MG tablet Take 10 mg by mouth daily.    . metFORMIN (GLUCOPHAGE) 500 MG tablet TAKE 1 TABLET BY MOUTH TWICE DAILY WITH MEALS  . Microlet Lancets MISC Use to test twice daily or as directed  . tamsulosin (FLOMAX) 0.4 MG CAPS capsule Take 1 capsule (0.4 mg total) by mouth daily.  Marland Kitchen  triamterene-hydrochlorothiazide (DYAZIDE) 37.5-25 MG capsule Take 1 capsule by mouth in the morning  . warfarin (COUMADIN) 5 MG tablet TAKE 1 TO 2 BY MOUTH AS  DIRECTED BY COUMADIN CLINIC     Allergies:   Enoxaparin, Enoxaparin sodium, Latex, Nickel, Percocet [oxycodone-acetaminophen], and Tape   Social History   Tobacco Use  . Smoking status: Former Smoker    Packs/day: 1.00    Years: 0.00    Pack years: 0.00    Types: Cigarettes    Quit date: 01/03/1969    Years since quitting: 50.8  . Smokeless tobacco: Never Used  Substance Use Topics  . Alcohol use: No    Alcohol/week: 1.0 standard drink    Types: 1 Glasses of wine per week    Comment: rare wine. 1-2x/yr.  . Drug use: No     Family Hx: The patient's family history includes Colon cancer in his father; Diabetes in his father; Hypertension in his father; Other in his mother. There is no history of Heart attack or Stroke.  ROS:   Please see the history of present illness.    Review of Systems  Constitutional: Negative.   HENT: Negative.   Respiratory: Negative.   Cardiovascular: Negative.   Gastrointestinal: Negative.   Musculoskeletal: Negative.   Neurological: Negative.   Psychiatric/Behavioral: Negative.   All other systems  reviewed and are negative.    Labs/Other Tests and Data Reviewed:    Recent Labs: 09/16/2019: ALT 30; Hemoglobin 13.9; Platelets 102.0; Pro B Natriuretic peptide (BNP) 270.0 10/25/2019: BUN 31; Creatinine, Ser 0.94; Potassium 3.8; Sodium 139   Recent Lipid Panel Lab Results  Component Value Date/Time   CHOL 110 02/15/2019 10:04 AM   TRIG 46.0 02/15/2019 10:04 AM   HDL 64.60 02/15/2019 10:04 AM   CHOLHDL 2 02/15/2019 10:04 AM   LDLCALC 37 02/15/2019 10:04 AM    Wt Readings from Last 3 Encounters:  10/28/19 212 lb (96.2 kg)  10/25/19 210 lb (95.3 kg)  09/30/19 205 lb 8 oz (93.2 kg)     Exam:    BP 110/64 (BP Location: Left Arm, Patient Position: Sitting, Cuff Size: Normal)   Pulse 65   Ht 5\' 10"  (1.778 m)   Wt 212 lb (96.2 kg)   SpO2 97%   BMI 30.42 kg/m  Constitutional:  oriented to person, place, and time. No distress.  HENT:  Head: Grossly normal Eyes:  no discharge. No scleral icterus.  Neck: No JVD, no carotid bruits  Cardiovascular: Regular rate and rhythm, no murmurs appreciated Pulmonary/Chest: Clear to auscultation bilaterally, no wheezes or rails Abdominal: Soft.  no distension.  no tenderness.  Musculoskeletal: Normal range of motion Neurological:  normal muscle tone. Coordination normal. No atrophy Skin: Skin warm and dry Psychiatric: normal affect, pleasant  ASSESSMENT & PLAN:    Atrial fibrillation/flutter On anticoagulation, asymptomatic Maintaining sinus bradycardia  Chronic diastolic CHF Recommend Lasix 20 daily, Goal weight around 210 pounds Recommend extra Lasix for 215 pounds or higher  Tachy-brady syndrome (HCC) Stable Asymptomatic from his bradycardia, Monitor for now  Mixed hyperlipidemia Cholesterol is at goal on the current lipid regimen. No changes to the medications were made.  Essential hypertension Blood pressure is well controlled on today's visit. No changes made to the medications.  Type 2 diabetes, controlled, with  neuropathy (HCC) Improved HBA1C 6.3 Weight trrending down  Aortic atherosclerosis (HCC) Lipids at goal  Obstructive sleep apnea On cpap, weight down Followed by pulmonary  Extensive review of recent events Multiple records reviewed  to evaluate his weight, medication changes  Total encounter time more than 35 minutes  Greater than 50% was spent in counseling and coordination of care with the patient   Signed, Ida Rogue, MD  10/28/2019 8:40 AM    [facility] [id number], [place], [place] [address]

## 2019-10-28 ENCOUNTER — Ambulatory Visit (INDEPENDENT_AMBULATORY_CARE_PROVIDER_SITE_OTHER): Payer: Medicare Other | Admitting: Cardiovascular Disease

## 2019-10-28 ENCOUNTER — Encounter: Payer: Self-pay | Admitting: Cardiovascular Disease

## 2019-10-28 ENCOUNTER — Other Ambulatory Visit: Payer: Self-pay

## 2019-10-28 VITALS — BP 110/64 | HR 65 | Ht 70.0 in | Wt 212.0 lb

## 2019-10-28 DIAGNOSIS — M7989 Other specified soft tissue disorders: Secondary | ICD-10-CM

## 2019-10-28 DIAGNOSIS — I77810 Thoracic aortic ectasia: Secondary | ICD-10-CM

## 2019-10-28 DIAGNOSIS — I2510 Atherosclerotic heart disease of native coronary artery without angina pectoris: Secondary | ICD-10-CM | POA: Diagnosis not present

## 2019-10-28 DIAGNOSIS — I4892 Unspecified atrial flutter: Secondary | ICD-10-CM | POA: Diagnosis not present

## 2019-10-28 DIAGNOSIS — I509 Heart failure, unspecified: Secondary | ICD-10-CM | POA: Diagnosis not present

## 2019-10-28 DIAGNOSIS — I340 Nonrheumatic mitral (valve) insufficiency: Secondary | ICD-10-CM | POA: Diagnosis not present

## 2019-10-28 DIAGNOSIS — I10 Essential (primary) hypertension: Secondary | ICD-10-CM | POA: Diagnosis not present

## 2019-10-28 LAB — PARATHYROID HORMONE, INTACT (NO CA): PTH: 109 pg/mL — ABNORMAL HIGH (ref 14–64)

## 2019-10-28 MED ORDER — FUROSEMIDE 20 MG PO TABS
20.0000 mg | ORAL_TABLET | Freq: Every day | ORAL | 3 refills | Status: DC | PRN
Start: 2019-10-28 — End: 2020-02-21

## 2019-10-28 NOTE — Patient Instructions (Addendum)
Medication Instructions:  Baseline weight 210, lasix 20 daily Take extra lasix for weight 215 or higher  If you need a refill on your cardiac medications before your next appointment, please call your pharmacy.    Lab work: No new labs needed   If you have labs (blood work) drawn today and your tests are completely normal, you will receive your results only by: Marland Kitchen MyChart Message (if you have MyChart) OR . A paper copy in the mail If you have any lab test that is abnormal or we need to change your treatment, we will call you to review the results.   Testing/Procedures: No new testing needed   Follow-Up: At [facility], you and your health needs are our priority.  As part of our continuing mission to provide you with exceptional heart care, we have created designated Provider Care Teams.  These Care Teams include your primary Cardiologist (physician) and Advanced Practice Providers (APPs -  Physician Assistants and Nurse Practitioners) who all work together to provide you with the care you need, when you need it.  . You will need a follow up appointment in 6 months  . Providers on your designated Care Team:   . Murray Hodgkins, NP . Christell Faith, PA-C . Marrianne Mood, PA-C  Any Other Special Instructions Will Be Listed Below (If Applicable).  COVID-19 Vaccine Information can be found at: [email or web address].uk For questions related to vaccine distribution or appointments, please email [email or web address] or call [phone].

## 2019-11-04 ENCOUNTER — Ambulatory Visit (INDEPENDENT_AMBULATORY_CARE_PROVIDER_SITE_OTHER): Payer: Medicare Other

## 2019-11-04 ENCOUNTER — Other Ambulatory Visit: Payer: Self-pay

## 2019-11-04 DIAGNOSIS — Z5181 Encounter for therapeutic drug level monitoring: Secondary | ICD-10-CM | POA: Diagnosis not present

## 2019-11-04 DIAGNOSIS — I4892 Unspecified atrial flutter: Secondary | ICD-10-CM | POA: Diagnosis not present

## 2019-11-04 LAB — POCT INR: INR: 2 (ref 2.0–3.0)

## 2019-11-04 NOTE — Patient Instructions (Signed)
-   take 1.5 tablets warfarin tonight, then  - continue warfarin dosage of 1 tablets daily except 1.5 tablet on SUNDAYS, TUESDAYS & THURSDAYS.   - Recheck in 3 weeks.   Please be consistent with your green intake - please pick a day or days each week to have your greens and have them every week on that day (your pills are on a schedule, so your greens need to be on one)

## 2019-11-16 ENCOUNTER — Other Ambulatory Visit: Payer: Self-pay | Admitting: Internal Medicine

## 2019-11-25 ENCOUNTER — Other Ambulatory Visit: Payer: Self-pay

## 2019-11-25 ENCOUNTER — Ambulatory Visit (INDEPENDENT_AMBULATORY_CARE_PROVIDER_SITE_OTHER): Payer: Medicare Other

## 2019-11-25 DIAGNOSIS — Z5181 Encounter for therapeutic drug level monitoring: Secondary | ICD-10-CM

## 2019-11-25 DIAGNOSIS — I4892 Unspecified atrial flutter: Secondary | ICD-10-CM | POA: Diagnosis not present

## 2019-11-25 LAB — POCT INR: INR: 2.1 (ref 2.0–3.0)

## 2019-11-25 NOTE — Patient Instructions (Signed)
-   continue warfarin dosage of 1 tablets daily except 1.5 tablet on SUNDAYS, TUESDAYS & THURSDAYS.   - Recheck in 4 weeks.   Please be consistent with your green intake - please pick a day or days each week to have your greens and have them every week on that day (your pills are on a schedule, so your greens need to be on one)

## 2019-12-18 ENCOUNTER — Ambulatory Visit (INDEPENDENT_AMBULATORY_CARE_PROVIDER_SITE_OTHER): Payer: Medicare Other | Admitting: Nurse Practitioner

## 2019-12-23 ENCOUNTER — Ambulatory Visit (INDEPENDENT_AMBULATORY_CARE_PROVIDER_SITE_OTHER): Payer: Medicare Other

## 2019-12-23 DIAGNOSIS — Z5181 Encounter for therapeutic drug level monitoring: Secondary | ICD-10-CM | POA: Diagnosis not present

## 2019-12-23 DIAGNOSIS — I4892 Unspecified atrial flutter: Secondary | ICD-10-CM

## 2019-12-23 LAB — POCT INR: INR: 2.2 (ref 2.0–3.0)

## 2019-12-23 NOTE — Patient Instructions (Signed)
-   continue warfarin dosage of 1 tablets daily except 1.5 tablet on SUNDAYS, TUESDAYS & THURSDAYS.   - Recheck in 5 weeks.   Please be consistent with your green intake - please pick a day or days each week to have your greens and have them every week on that day (your pills are on a schedule, so your greens need to be on one) 

## 2020-01-02 ENCOUNTER — Other Ambulatory Visit: Payer: Self-pay | Admitting: Internal Medicine

## 2020-01-29 ENCOUNTER — Other Ambulatory Visit: Payer: Self-pay

## 2020-01-29 ENCOUNTER — Ambulatory Visit (INDEPENDENT_AMBULATORY_CARE_PROVIDER_SITE_OTHER): Payer: Medicare Other

## 2020-01-29 DIAGNOSIS — I4892 Unspecified atrial flutter: Secondary | ICD-10-CM

## 2020-01-29 DIAGNOSIS — Z5181 Encounter for therapeutic drug level monitoring: Secondary | ICD-10-CM

## 2020-01-29 LAB — POCT INR: INR: 1.7 — AB (ref 2.0–3.0)

## 2020-01-29 NOTE — Patient Instructions (Signed)
-   take 2 tablets warfarin tonight, then  - continue warfarin dosage of 1 tablets daily except 1.5 tablet on SUNDAYS, TUESDAYS & THURSDAYS.   - Recheck in 5 weeks.   Please be consistent with your green intake - please pick a day or days each week to have your greens and have them every week on that day (your pills are on a schedule, so your greens need to be on one)

## 2020-02-21 ENCOUNTER — Encounter: Payer: Self-pay | Admitting: Internal Medicine

## 2020-02-21 ENCOUNTER — Other Ambulatory Visit: Payer: Self-pay

## 2020-02-21 ENCOUNTER — Ambulatory Visit (INDEPENDENT_AMBULATORY_CARE_PROVIDER_SITE_OTHER): Payer: Medicare Other | Admitting: Internal Medicine

## 2020-02-21 VITALS — BP 112/60 | HR 60 | Temp 96.9°F | Ht 69.0 in | Wt 211.0 lb

## 2020-02-21 DIAGNOSIS — I700 Atherosclerosis of aorta: Secondary | ICD-10-CM

## 2020-02-21 DIAGNOSIS — E1140 Type 2 diabetes mellitus with diabetic neuropathy, unspecified: Secondary | ICD-10-CM | POA: Diagnosis not present

## 2020-02-21 DIAGNOSIS — D696 Thrombocytopenia, unspecified: Secondary | ICD-10-CM

## 2020-02-21 DIAGNOSIS — Z7189 Other specified counseling: Secondary | ICD-10-CM

## 2020-02-21 DIAGNOSIS — Z0000 Encounter for general adult medical examination without abnormal findings: Secondary | ICD-10-CM

## 2020-02-21 DIAGNOSIS — I4892 Unspecified atrial flutter: Secondary | ICD-10-CM | POA: Diagnosis not present

## 2020-02-21 DIAGNOSIS — I83013 Varicose veins of right lower extremity with ulcer of ankle: Secondary | ICD-10-CM

## 2020-02-21 DIAGNOSIS — I5032 Chronic diastolic (congestive) heart failure: Secondary | ICD-10-CM

## 2020-02-21 DIAGNOSIS — L97311 Non-pressure chronic ulcer of right ankle limited to breakdown of skin: Secondary | ICD-10-CM

## 2020-02-21 LAB — CBC
HCT: 42.1 % (ref 39.0–52.0)
Hemoglobin: 14.6 g/dL (ref 13.0–17.0)
MCHC: 34.6 g/dL (ref 30.0–36.0)
MCV: 91.4 fl (ref 78.0–100.0)
Platelets: 124 10*3/uL — ABNORMAL LOW (ref 150.0–400.0)
RBC: 4.61 Mil/uL (ref 4.22–5.81)
RDW: 13 % (ref 11.5–15.5)
WBC: 5.8 10*3/uL (ref 4.0–10.5)

## 2020-02-21 LAB — COMPREHENSIVE METABOLIC PANEL
ALT: 17 U/L (ref 0–53)
AST: 16 U/L (ref 0–37)
Albumin: 4 g/dL (ref 3.5–5.2)
Alkaline Phosphatase: 61 U/L (ref 39–117)
BUN: 26 mg/dL — ABNORMAL HIGH (ref 6–23)
CO2: 32 mEq/L (ref 19–32)
Calcium: 10.9 mg/dL — ABNORMAL HIGH (ref 8.4–10.5)
Chloride: 103 mEq/L (ref 96–112)
Creatinine, Ser: 1 mg/dL (ref 0.40–1.50)
GFR: 67.78 mL/min (ref >60.00)
Glucose, Bld: 133 mg/dL — ABNORMAL HIGH (ref 70–99)
Potassium: 3.8 mEq/L (ref 3.5–5.1)
Sodium: 139 mEq/L (ref 135–145)
Total Bilirubin: 1.1 mg/dL (ref 0.2–1.2)
Total Protein: 6.8 g/dL (ref 6.0–8.3)

## 2020-02-21 LAB — LIPID PANEL
Cholesterol: 120 mg/dL (ref 0–200)
HDL: 72.2 mg/dL (ref >39.00)
LDL Cholesterol: 36 mg/dL (ref 0–99)
NonHDL: 47.41
Total CHOL/HDL Ratio: 2
Triglycerides: 56 mg/dL (ref 0.0–149.0)
VLDL: 11.2 mg/dL (ref 0.0–40.0)

## 2020-02-21 LAB — MICROALBUMIN / CREATININE URINE RATIO
Creatinine,U: 120.6 mg/dL
Microalb Creat Ratio: 1.3 mg/g (ref 0.0–30.0)
Microalb, Ur: 1.5 mg/dL (ref 0.0–1.9)

## 2020-02-21 LAB — HEMOGLOBIN A1C: Hgb A1c MFr Bld: 7 % — ABNORMAL HIGH (ref 4.6–6.5)

## 2020-02-21 LAB — HM DIABETES FOOT EXAM

## 2020-02-21 MED ORDER — FUROSEMIDE 20 MG PO TABS
20.0000 mg | ORAL_TABLET | Freq: Every day | ORAL | 3 refills | Status: DC | PRN
Start: 2020-02-21 — End: 2020-02-24

## 2020-02-21 NOTE — Assessment & Plan Note (Signed)
Seems to have good control Balance, etc seems to be part of his neuropathy---advised using walking stick Continues on metformin

## 2020-02-21 NOTE — Assessment & Plan Note (Signed)
On imaging Is on statin 

## 2020-02-21 NOTE — Assessment & Plan Note (Signed)
Some easy bruising but is on coumadin

## 2020-02-21 NOTE — Progress Notes (Signed)
Hearing Screening   125Hz  250Hz  500Hz  1000Hz  2000Hz  3000Hz  4000Hz  6000Hz  8000Hz   Right ear:           Left ear:           Comments: Has a hearing aid. Not wearing it today.  Vision Screening Comments: Appt March 2022

## 2020-02-21 NOTE — Assessment & Plan Note (Signed)
Has DNR 

## 2020-02-21 NOTE — Assessment & Plan Note (Signed)
Compensated now on low dose furosemide

## 2020-02-21 NOTE — Assessment & Plan Note (Signed)
Superficial ulcer now No big issues since edema is better

## 2020-02-21 NOTE — Assessment & Plan Note (Signed)
I have personally reviewed the Medicare Annual Wellness questionnaire and have noted 1. The patient's medical and social history 2. Their use of alcohol, tobacco or illicit drugs 3. Their current medications and supplements 4. The patient's functional ability including ADL's, fall risks, home safety risks and hearing or visual             impairment. 5. Diet and physical activities 6. Evidence for depression or mood disorders  The patients weight, height, BMI and visual acuity have been recorded in the chart I have made referrals, counseling and provided education to the patient based review of the above and I have provided the pt with a written personalized care plan for preventive services.  I have provided you with a copy of your personalized plan for preventive services. Please take the time to review along with your updated medication list.  Fairly healthy Exercises regularly Is ready to get shingrix Had COVID booster and flu vaccine

## 2020-02-21 NOTE — Progress Notes (Signed)
Subjective:    Patient ID: Michael Colon, male    DOB: 1930-12-04, 87 y.o.   MRN: [id number]  HPI Here for Medicare wellness visit and follow up of chronic health conditions This visit occurred during the SARS-CoV-2 public health emergency.  Safety protocols were in place, including screening questions prior to the visit, additional usage of staff PPE, and extensive cleaning of exam room while observing appropriate contact time as indicated for disinfecting solutions.   Reviewed form and advanced directives Reviewed other doctors No alcohol or tobacco Exercises regularly Vision is okay--has eye exam coming up Bought hearing aide---but having problems with it No falls this year No depression or anhedonia Independent with instrumental ADLs No sig memory issues  Has some trouble with drooling--just notices when sitting around Not when eating Walks bent forward--no stiffness or other Parkinson's traits  Has some instability Has to be careful with change in head position Will feel "wave" if he turns a certain way in bed Careful about not turning quickly when walking Ongoing foot symptoms  No recurrence of edema No PND--uses CPAP No chest pain or SOB---stamina is stable No palpitations  Platelets low for a while--- last 102K Bruises easily  Aortic atherosclerosis known on imaging  Does have dribbling  Flow is okay--- no trouble emptying Nocturia is rare  Current Outpatient Medications on File Prior to Visit  Medication Sig Dispense Refill  . APPLE CIDER VINEGAR PO Take by mouth.    Marland Kitchen atorvastatin (LIPITOR) 80 MG tablet TAKE 1 TABLET BY MOUTH  DAILY 90 tablet 3  . cetirizine (ZYRTEC) 10 MG tablet Take 10 mg by mouth daily.    . Cyanocobalamin (B-12) 2500 MCG SUBL Place under the tongue.    . docusate sodium (COLACE) 100 MG capsule Take 100 mg by mouth 2 (two) times daily.    . famotidine (PEPCID) 20 MG tablet Take 20 mg by mouth daily.    . finasteride (PROSCAR) 5 MG  tablet TAKE 1 TABLET BY MOUTH ONCE DAILY 90 tablet 3  . glucose blood (BAYER CONTOUR TEST) test strip USE STRIP TO TEST BLOOD SUGAR ONCE DAILY. Dx Code: E11.40 100 each 3  . ibuprofen (ADVIL,MOTRIN) 200 MG tablet Take 400 mg by mouth 2 (two) times daily.     Marland Kitchen loratadine (CLARITIN) 10 MG tablet Take 10 mg by mouth daily.    . metFORMIN (GLUCOPHAGE) 500 MG tablet TAKE 1 TABLET BY MOUTH  TWICE DAILY WITH MEALS 180 tablet 3  . Microlet Lancets MISC Use to test twice daily or as directed 200 each 3  . tamsulosin (FLOMAX) 0.4 MG CAPS capsule TAKE 1 CAPSULE BY MOUTH  DAILY 90 capsule 3  . triamterene-hydrochlorothiazide (DYAZIDE) 37.5-25 MG capsule TAKE 1 CAPSULE BY MOUTH IN  THE MORNING 90 capsule 3  . warfarin (COUMADIN) 5 MG tablet TAKE 1 TO 1 AND A 1/2 TABLETS BY  MOUTH DAILY AS DIRECTED BY  COUMADIN CLINIC 150 tablet 0  . furosemide (LASIX) 20 MG tablet Take 1 tablet (20 mg total) by mouth daily as needed (Extra pill as needed for weight 215 or higher). Once daily and extra pill as needed for weight 215 or higher. 180 tablet 3   No current facility-administered medications on file prior to visit.    Allergies  Allergen Reactions  . Enoxaparin Hives  . Enoxaparin Sodium Hives  . Latex Rash    Has trouble with BANDAIDS that have been left on for more than 24 hours. Prefers paper tape.  Marland Kitchen  Nickel Rash  . Percocet [Oxycodone-Acetaminophen] Rash  . Tape Rash and Other (See Comments)    Sensitivity     Past Medical History:  Diagnosis Date  . Asthma   . Atrial flutter ([place]) 2007  . Band keratopathy   . Benign prostatic hypertrophy   . Chronic ear infection   . Chronic kidney disease   . Chronic prostatitis   . Dental bridge present    permanent - upper  . Diabetes mellitus   . Elbow stiffness, left    s/p fracture many yrs ago.  arm does not straighten  . GERD (gastroesophageal reflux disease)    laryngeal involvement  . Hyperlipidemia   . Hypertension   . Obstructive sleep apnea     CPAP-9  . Osteoarthrosis, localized, primary, knee    post-traumatic  . Prostatitis   . Stroke [facility])    TIA  . Tachy-brady syndrome ([place])   . UTI (urinary tract infection)     Past Surgical History:  Procedure Laterality Date  . APPENDECTOMY    . BELPHAROPTOSIS REPAIR     Dr [name]---didn't resolve weepy eye and eyelid drooping  . CARDIOVERSION  04/13/2010  . CATARACT EXTRACTION, BILATERAL  2009  . Chest pain  8/12   Stress test benign  . ESOPHAGEAL DILATION  05/26/2016   Procedure: ESOPHAGEAL DILATION;  Surgeon: Lucilla Lame, MD;  Location: [place];  Service: Endoscopy;;  . ESOPHAGOGASTRODUODENOSCOPY (EGD) WITH PROPOFOL N/A 05/26/2016   Procedure: ESOPHAGOGASTRODUODENOSCOPY (EGD) WITH PROPOFOL;  Surgeon: Lucilla Lame, MD;  Location: [place];  Service: Endoscopy;  Laterality: N/A;  Diabetic - oral meds sleep apnea  . EYE SURGERY    . FRACTURE SURGERY Left    elbow  . KNEE SURGERY  1998   plate after fracture, then removed for infection  . left elbow surgery    . MASTOIDECTOMY  8/08   Dr Idelle Crouch  . RHINOPLASTY  5/10   and septoplasty  . SUBACROMIAL DECOMPRESSION Right 2005   Arthroscopic (for rotator cuff and biceps tendon ruptures)  . TEAR DUCT PROBING  11/13   Dr Vickki Muff  . TOTAL KNEE ARTHROPLASTY Left 09/01/2014   Procedure: TOTAL KNEE ARTHROPLASTY;  Surgeon: Dereck Leep, MD;  Location: [facility];  Service: Orthopedics;  Laterality: Left;  . TRIGGER FINGER RELEASE Left 02/25/2015   Procedure: LEFT LONG TRIGGER RELEASE;  Surgeon: Dereck Leep, MD;  Location: [facility];  Service: Orthopedics;  Laterality: Left;  Marland Kitchen VENOUS ABLATION      Family History  Problem Relation Age of Onset  . Colon cancer Father   . Diabetes Father   . Hypertension Father   . Other Mother        natural causes  . Heart attack Neg Hx   . Stroke Neg Hx     Social History   Socioeconomic History  . Marital status: Widowed    Spouse name: Not on file  . Number  of children: 2  . Years of education: Not on file  . Highest education level: Not on file  Occupational History  . Occupation: Theme park manager    Comment: Retired--[employer]  Tobacco Use  . Smoking status: Former Smoker    Packs/day: 1.00    Years: 0.00    Pack years: 0.00    Types: Cigarettes    Quit date: 01/03/1969    Years since quitting: 51.1  . Smokeless tobacco: Never Used  Substance and Sexual Activity  . Alcohol use: No  Alcohol/week: 1.0 standard drink    Types: 1 Glasses of wine per week    Comment: rare wine. 1-2x/yr.  . Drug use: No  . Sexual activity: Not on file  Other Topics Concern  . Not on file  Social History Narrative   Has living will   Health care POA-- son Shanon Brow   Has DNR order from the past---form redone 06/25/10   Probably no feeding tube if cognitively unaware   Social Determinants of Health   Financial Resource Strain: Not on file  Food Insecurity: Not on file  Transportation Needs: Not on file  Physical Activity: Not on file  Stress: Not on file  Social Connections: Not on file  Intimate Partner Violence: Not on file   Review of Systems Appetite is fine Weight is fairly stable since getting his fluid off Sleeps okay--uses the CPAP every night Teeth okay--keeps up with dentist Now with just one slight open area in right lower calf--occasionally gets open areas along stasis changes on left No heartburn or dysphagia Bowels are fine--no blood No sig back or joint pains Gets pink--red spots----heals with clear to milky covering    Objective:   Physical Exam Constitutional:      Appearance: Normal appearance.  HENT:     Mouth/Throat:     Comments: No lesions Eyes:     Conjunctiva/sclera: Conjunctivae normal.     Pupils: Pupils are equal, round, and reactive to light.  Cardiovascular:     Rate and Rhythm: Normal rate and regular rhythm.     Heart sounds: No gallop.      Comments: Faint pedal pulses Slight systolic murmur at apex Pulmonary:      Effort: Pulmonary effort is normal.     Breath sounds: Normal breath sounds. No wheezing or rales.  Abdominal:     Palpations: Abdomen is soft.     Tenderness: There is no abdominal tenderness.  Musculoskeletal:     Comments: Trace ankle edema  Skin:    Findings: No rash.     Comments: Superficial ulcer on lower right calf  Neurological:     Mental Status: He is alert and oriented to person, place, and time.     Comments: President---"Joe Biden, Trump, wasn't Bush----Obama" [id number] Recall 3/3  Decreased sensation in feet  Psychiatric:        Mood and Affect: Mood normal.        Behavior: Behavior normal.            Assessment & Plan:

## 2020-02-21 NOTE — Assessment & Plan Note (Signed)
Rate fine and regular now On coumadin

## 2020-02-24 ENCOUNTER — Telehealth: Payer: Self-pay | Admitting: Internal Medicine

## 2020-02-24 MED ORDER — FUROSEMIDE 20 MG PO TABS: 20.0000 mg | ORAL_TABLET | Freq: Every day | ORAL | 0 refills | Status: DC | PRN

## 2020-02-24 NOTE — Addendum Note (Signed)
Addended by: Pilar Grammes on: 02/24/2020 10:55 AM   Modules accepted: Orders

## 2020-02-24 NOTE — Telephone Encounter (Signed)
Patient called in stating that [facility] sent him a letter stating that his medication is not available and they are asking for a replacement as the supply is not available. Please advise.  Medicaiton:Furosemide (Lasix)

## 2020-02-24 NOTE — Telephone Encounter (Signed)
Spoke to pt. Sent a 90 day supply to [facility]. He will follow-up with [facility] to see if they get it back in stock.

## 2020-03-04 ENCOUNTER — Other Ambulatory Visit: Payer: Self-pay

## 2020-03-04 ENCOUNTER — Ambulatory Visit (INDEPENDENT_AMBULATORY_CARE_PROVIDER_SITE_OTHER): Payer: Medicare Other

## 2020-03-04 DIAGNOSIS — Z5181 Encounter for therapeutic drug level monitoring: Secondary | ICD-10-CM

## 2020-03-04 DIAGNOSIS — I4892 Unspecified atrial flutter: Secondary | ICD-10-CM | POA: Diagnosis not present

## 2020-03-04 LAB — POCT INR: INR: 3.5 — AB (ref 2.0–3.0)

## 2020-03-04 NOTE — Patient Instructions (Signed)
-   skip warfarin tonight, then  - continue warfarin dosage of 1 tablets daily except 1.5 tablet on SUNDAYS, TUESDAYS & THURSDAYS.   - Recheck in 4 weeks.   Please be consistent with your green intake - please pick a day or days each week to have your greens and have them every week on that day (your pills are on a schedule, so your greens need to be on one)

## 2020-03-06 ENCOUNTER — Encounter: Payer: Self-pay | Admitting: Primary Care

## 2020-03-06 ENCOUNTER — Ambulatory Visit (INDEPENDENT_AMBULATORY_CARE_PROVIDER_SITE_OTHER): Payer: Medicare Other | Admitting: Primary Care

## 2020-03-06 ENCOUNTER — Other Ambulatory Visit: Payer: Self-pay

## 2020-03-06 DIAGNOSIS — T148XXA Other injury of unspecified body region, initial encounter: Secondary | ICD-10-CM | POA: Diagnosis not present

## 2020-03-06 LAB — POCT INR: INR: 2.2 (ref 2.0–3.0)

## 2020-03-06 NOTE — Assessment & Plan Note (Addendum)
Bruising and swelling today more representative of hematoma rather than compartment syndrome.  We discussed home care treatment today which includes ice and elevation.  Repeat INR check today which is 2.2, improvement from 2 days ago.  Continue current regimen as prescribed by cardiology.  Discussed to closely monitor for symptoms of compartment syndrome, he is familiar with this as he has a history of to his left lower extremity.  We will see him back for close follow-up early next week, appointment scheduled today.

## 2020-03-06 NOTE — Addendum Note (Signed)
Addended by: Randall An on: 03/06/2020 03:53 PM   Modules accepted: Orders

## 2020-03-06 NOTE — Patient Instructions (Signed)
Elevate your arm when resting, okay to apply ice for swelling.  Go to the hospital if your swelling becomes increased, you notice increased pain and/or numbness.  We will see you Tuesday next week as scheduled.  It was a pleasure meeting you!

## 2020-03-06 NOTE — Progress Notes (Signed)
Subjective:    Patient ID: Michael Colon, male    DOB: [birth year], 87 y.o.   MRN: [id number]  HPI  This visit occurred during the SARS-CoV-2 public health emergency.  Safety protocols were in place, including screening questions prior to the visit, additional usage of staff PPE, and extensive cleaning of exam room while observing appropriate contact time as indicated for disinfecting solutions.   Michael Colon is a 87 year old male patient of Dr. Silvio Pate with a history of atrial flutter managed on anticoagulation, compartment syndrome,  right sided bicep tendon rupture, TIA, chronic venous insufficiency, thrombocytopenia, CHF who presents today to discuss bruising.  His bruising is located to the left medial upper arm for which he noticed about one day ago. He was working in his shop, working with metals, was holding the metal very firmly, and thinks he may have "overstressed a muscle". He's experiencing tenderness, swelling at the side, doesn't feel the same as his prior compartment syndrome.   He is managed on warfarin 5 mg, INR 3.5 two days ago. He's not applied ice to the site.   Review of Systems  Cardiovascular:       Left upper extremity swelling  Skin: Positive for color change.       Past Medical History:  Diagnosis Date  . Asthma   . Atrial flutter ([place]) 2007  . Band keratopathy   . Benign prostatic hypertrophy   . Chronic ear infection   . Chronic kidney disease   . Chronic prostatitis   . Dental bridge present    permanent - upper  . Diabetes mellitus   . Elbow stiffness, left    s/p fracture many yrs ago.  arm does not straighten  . GERD (gastroesophageal reflux disease)    laryngeal involvement  . Hyperlipidemia   . Hypertension   . Obstructive sleep apnea    CPAP-9  . Osteoarthrosis, localized, primary, knee    post-traumatic  . Prostatitis   . Stroke [facility])    TIA  . Tachy-brady syndrome ([place])   . UTI (urinary tract infection)      Social History    Socioeconomic History  . Marital status: Widowed    Spouse name: Not on file  . Number of children: 2  . Years of education: Not on file  . Highest education level: Not on file  Occupational History  . Occupation: Theme park manager    Comment: Retired--[employer]  Tobacco Use  . Smoking status: Former Smoker    Packs/day: 1.00    Years: 0.00    Pack years: 0.00    Types: Cigarettes    Quit date: 01/03/1969    Years since quitting: 51.2  . Smokeless tobacco: Never Used  Substance and Sexual Activity  . Alcohol use: No    Alcohol/week: 1.0 standard drink    Types: 1 Glasses of wine per week    Comment: rare wine. 1-2x/yr.  . Drug use: No  . Sexual activity: Not on file  Other Topics Concern  . Not on file  Social History Narrative   Has living will   Health care POA-- son Shanon Brow   Has DNR order from the past---form redone 06/25/10   Probably no feeding tube if cognitively unaware   Social Determinants of Health   Financial Resource Strain: Not on file  Food Insecurity: Not on file  Transportation Needs: Not on file  Physical Activity: Not on file  Stress: Not on file  Social Connections: Not on file  Intimate Partner Violence: Not on file    Past Surgical History:  Procedure Laterality Date  . APPENDECTOMY    . BELPHAROPTOSIS REPAIR     Dr [name]---didn't resolve weepy eye and eyelid drooping  . CARDIOVERSION  04/13/2010  . CATARACT EXTRACTION, BILATERAL  2009  . Chest pain  8/12   Stress test benign  . ESOPHAGEAL DILATION  05/26/2016   Procedure: ESOPHAGEAL DILATION;  Surgeon: Lucilla Lame, MD;  Location: [place];  Service: Endoscopy;;  . ESOPHAGOGASTRODUODENOSCOPY (EGD) WITH PROPOFOL N/A 05/26/2016   Procedure: ESOPHAGOGASTRODUODENOSCOPY (EGD) WITH PROPOFOL;  Surgeon: Lucilla Lame, MD;  Location: [place];  Service: Endoscopy;  Laterality: N/A;  Diabetic - oral meds sleep apnea  . EYE SURGERY    . FRACTURE SURGERY Left    elbow  . KNEE SURGERY   1998   plate after fracture, then removed for infection  . left elbow surgery    . MASTOIDECTOMY  8/08   Dr Idelle Crouch  . RHINOPLASTY  5/10   and septoplasty  . SUBACROMIAL DECOMPRESSION Right 2005   Arthroscopic (for rotator cuff and biceps tendon ruptures)  . TEAR DUCT PROBING  11/13   Dr Vickki Muff  . TOTAL KNEE ARTHROPLASTY Left 09/01/2014   Procedure: TOTAL KNEE ARTHROPLASTY;  Surgeon: Dereck Leep, MD;  Location: [facility];  Service: Orthopedics;  Laterality: Left;  . TRIGGER FINGER RELEASE Left 02/25/2015   Procedure: LEFT LONG TRIGGER RELEASE;  Surgeon: Dereck Leep, MD;  Location: [facility];  Service: Orthopedics;  Laterality: Left;  Marland Kitchen VENOUS ABLATION      Family History  Problem Relation Age of Onset  . Colon cancer Father   . Diabetes Father   . Hypertension Father   . Other Mother        natural causes  . Heart attack Neg Hx   . Stroke Neg Hx     Allergies  Allergen Reactions  . Enoxaparin Hives  . Enoxaparin Sodium Hives  . Latex Rash    Has trouble with BANDAIDS that have been left on for more than 24 hours. Prefers paper tape.  . Nickel Rash  . Percocet [Oxycodone-Acetaminophen] Rash  . Tape Rash and Other (See Comments)    Sensitivity     Current Outpatient Medications on File Prior to Visit  Medication Sig Dispense Refill  . APPLE CIDER VINEGAR PO Take by mouth.    Marland Kitchen atorvastatin (LIPITOR) 80 MG tablet TAKE 1 TABLET BY MOUTH  DAILY 90 tablet 3  . cetirizine (ZYRTEC) 10 MG tablet Take 10 mg by mouth daily.    . Cyanocobalamin (B-12) 2500 MCG SUBL Place under the tongue.    . docusate sodium (COLACE) 100 MG capsule Take 100 mg by mouth 2 (two) times daily.    . famotidine (PEPCID) 20 MG tablet Take 20 mg by mouth daily.    . finasteride (PROSCAR) 5 MG tablet TAKE 1 TABLET BY MOUTH ONCE DAILY 90 tablet 3  . furosemide (LASIX) 20 MG tablet Take 1 tablet (20 mg total) by mouth daily as needed (Extra pill as needed for weight 215 or higher). Once daily and  extra pill as needed for weight 215 or higher. 180 tablet 0  . glucose blood (BAYER CONTOUR TEST) test strip USE STRIP TO TEST BLOOD SUGAR ONCE DAILY. Dx Code: E11.40 100 each 3  . ibuprofen (ADVIL,MOTRIN) 200 MG tablet Take 400 mg by mouth 2 (two) times daily.     Marland Kitchen loratadine (CLARITIN) 10 MG tablet  Take 10 mg by mouth daily.    . metFORMIN (GLUCOPHAGE) 500 MG tablet TAKE 1 TABLET BY MOUTH  TWICE DAILY WITH MEALS 180 tablet 3  . Microlet Lancets MISC Use to test twice daily or as directed 200 each 3  . tamsulosin (FLOMAX) 0.4 MG CAPS capsule TAKE 1 CAPSULE BY MOUTH  DAILY 90 capsule 3  . triamterene-hydrochlorothiazide (DYAZIDE) 37.5-25 MG capsule TAKE 1 CAPSULE BY MOUTH IN  THE MORNING 90 capsule 3  . warfarin (COUMADIN) 5 MG tablet TAKE 1 TO 1 AND A 1/2 TABLETS BY  MOUTH DAILY AS DIRECTED BY  COUMADIN CLINIC 150 tablet 0   No current facility-administered medications on file prior to visit.    BP 138/60   Pulse 68   Temp 98.3 F (36.8 C) (Temporal)   Ht 5\' 9"  (1.753 m)   Wt 211 lb (95.7 kg)   SpO2 97%   BMI 31.16 kg/m    Objective:   Physical Exam Cardiovascular:     Pulses: Normal pulses.          Radial pulses are 2+ on the right side and 2+ on the left side.  Pulmonary:     Effort: Pulmonary effort is normal.  Skin:    General: Skin is warm and dry.     Findings: Bruising present.     Comments: Significant bruising and swelling to left medial upper arm, tenderness.  Decrease in range of motion, mild weakness of left upper extremity.  Equal pulses bilaterally.  Neurological:     Mental Status: He is alert.            Assessment & Plan:

## 2020-03-08 ENCOUNTER — Emergency Department
Admission: EM | Admit: 2020-03-08 | Discharge: 2020-03-08 | Disposition: A | Discharge status: Home / Self Care | Payer: Medicare Other | Attending: Emergency Medicine | Admitting: Emergency Medicine

## 2020-03-08 ENCOUNTER — Other Ambulatory Visit: Payer: Self-pay

## 2020-03-08 DIAGNOSIS — Z96652 Presence of left artificial knee joint: Secondary | ICD-10-CM | POA: Insufficient documentation

## 2020-03-08 DIAGNOSIS — I5032 Chronic diastolic (congestive) heart failure: Secondary | ICD-10-CM | POA: Insufficient documentation

## 2020-03-08 DIAGNOSIS — N189 Chronic kidney disease, unspecified: Secondary | ICD-10-CM | POA: Insufficient documentation

## 2020-03-08 DIAGNOSIS — Z7984 Long term (current) use of oral hypoglycemic drugs: Secondary | ICD-10-CM | POA: Diagnosis not present

## 2020-03-08 DIAGNOSIS — I130 Hypertensive heart and chronic kidney disease with heart failure and stage 1 through stage 4 chronic kidney disease, or unspecified chronic kidney disease: Secondary | ICD-10-CM | POA: Diagnosis not present

## 2020-03-08 DIAGNOSIS — Z7901 Long term (current) use of anticoagulants: Secondary | ICD-10-CM | POA: Insufficient documentation

## 2020-03-08 DIAGNOSIS — S40022A Contusion of left upper arm, initial encounter: Secondary | ICD-10-CM | POA: Diagnosis not present

## 2020-03-08 DIAGNOSIS — S4992XA Unspecified injury of left shoulder and upper arm, initial encounter: Secondary | ICD-10-CM | POA: Diagnosis present

## 2020-03-08 DIAGNOSIS — X509XXA Other and unspecified overexertion or strenuous movements or postures, initial encounter: Secondary | ICD-10-CM | POA: Insufficient documentation

## 2020-03-08 DIAGNOSIS — E1122 Type 2 diabetes mellitus with diabetic chronic kidney disease: Secondary | ICD-10-CM | POA: Insufficient documentation

## 2020-03-08 DIAGNOSIS — Z79899 Other long term (current) drug therapy: Secondary | ICD-10-CM | POA: Diagnosis not present

## 2020-03-08 DIAGNOSIS — Z91040 Latex allergy status: Secondary | ICD-10-CM | POA: Insufficient documentation

## 2020-03-08 DIAGNOSIS — Y93E6 Activity, residential relocation: Secondary | ICD-10-CM | POA: Diagnosis not present

## 2020-03-08 DIAGNOSIS — T148XXA Other injury of unspecified body region, initial encounter: Secondary | ICD-10-CM

## 2020-03-08 DIAGNOSIS — Z87891 Personal history of nicotine dependence: Secondary | ICD-10-CM | POA: Insufficient documentation

## 2020-03-08 DIAGNOSIS — J45909 Unspecified asthma, uncomplicated: Secondary | ICD-10-CM | POA: Diagnosis not present

## 2020-03-08 DIAGNOSIS — E1140 Type 2 diabetes mellitus with diabetic neuropathy, unspecified: Secondary | ICD-10-CM | POA: Insufficient documentation

## 2020-03-08 LAB — COMPREHENSIVE METABOLIC PANEL
ALT: 17 U/L (ref 0–44)
AST: 21 U/L (ref 15–41)
Albumin: 3.7 g/dL (ref 3.5–5.0)
Alkaline Phosphatase: 59 U/L (ref 38–126)
Anion gap: 12 (ref 5–15)
BUN: 28 mg/dL — ABNORMAL HIGH (ref 8–23)
CO2: 22 mmol/L (ref 22–32)
Calcium: 10.2 mg/dL (ref 8.9–10.3)
Chloride: 101 mmol/L (ref 98–111)
Creatinine, Ser: 0.87 mg/dL (ref 0.61–1.24)
GFR, Estimated: >60 mL/min (ref >60)
Glucose, Bld: 267 mg/dL — ABNORMAL HIGH (ref 70–99)
Potassium: 3.7 mmol/L (ref 3.5–5.1)
Sodium: 135 mmol/L (ref 135–145)
Total Bilirubin: 1.5 mg/dL — ABNORMAL HIGH (ref 0.3–1.2)
Total Protein: 6.5 g/dL (ref 6.5–8.1)

## 2020-03-08 LAB — PROTIME-INR
INR: 1.9 — ABNORMAL HIGH (ref 0.8–1.2)
Prothrombin Time: 21.2 seconds — ABNORMAL HIGH (ref 11.4–15.2)

## 2020-03-08 LAB — CBC WITH DIFFERENTIAL/PLATELET
Abs Immature Granulocytes: 0.02 10*3/uL (ref 0.00–0.07)
Basophils Absolute: 0.1 10*3/uL (ref 0.0–0.1)
Basophils Relative: 1 %
Eosinophils Absolute: 0.2 10*3/uL (ref 0.0–0.5)
Eosinophils Relative: 3 %
HCT: 38.4 % — ABNORMAL LOW (ref 39.0–52.0)
Hemoglobin: 13.1 g/dL (ref 13.0–17.0)
Immature Granulocytes: 0 %
Lymphocytes Relative: 12 %
Lymphs Abs: 1 10*3/uL (ref 0.7–4.0)
MCH: 31.4 pg (ref 26.0–34.0)
MCHC: 34.1 g/dL (ref 30.0–36.0)
MCV: 92.1 fL (ref 80.0–100.0)
Monocytes Absolute: 1.4 10*3/uL — ABNORMAL HIGH (ref 0.1–1.0)
Monocytes Relative: 18 %
Neutro Abs: 5.4 10*3/uL (ref 1.7–7.7)
Neutrophils Relative %: 66 %
Platelets: 127 10*3/uL — ABNORMAL LOW (ref 150–400)
RBC: 4.17 MIL/uL — ABNORMAL LOW (ref 4.22–5.81)
RDW: 12.2 % (ref 11.5–15.5)
WBC: 8 10*3/uL (ref 4.0–10.5)
nRBC: 0 % (ref 0.0–0.2)

## 2020-03-08 LAB — TYPE AND SCREEN
ABO/RH(D): AB NEG
Antibody Screen: NEGATIVE

## 2020-03-08 NOTE — Discharge Instructions (Signed)
Please seek medical attention for any high fevers, chest pain, shortness of breath, change in behavior, persistent vomiting, bloody stool or any other new or concerning symptoms.  

## 2020-03-08 NOTE — ED Notes (Signed)
Pt presents to ED with c/o of having bleeding to his L upper extremity. Pt states there has been no trauma or injury. Pt states he has been recently moving furniture but denies hitting his L upper arm. Pt states he does take Coumadin. Pt states recently hd an elevated INR and had to skip a few pills. Pt denies bleeding anywhere else. Pt is A&Ox4. NAD noted.

## 2020-03-08 NOTE — ED Triage Notes (Signed)
Pt states that he has been moving furniture recently and now has a large bruise to his upper left arm with swelling down whole arm noted- pt states that he does take blood thinners- pt states that he started noticing the bruising on Friday afternoon

## 2020-03-08 NOTE — ED Notes (Signed)
D/C and f/up discussed with pt and concerns to return to the ED, pt verbalized understanding. NAD noted. Pt ambulatory on D/C with steady gait.

## 2020-03-09 NOTE — ED Provider Notes (Signed)
[facility] Emergency Department Provider Note  ____________________________________________   I have reviewed the triage vital signs and the nursing notes.   HISTORY  Chief Complaint Bleeding/Bruising   History limited by: Not Limited   HPI Michael Colon is a 87 y.o. male who presents to the emergency department today because of concern for bruising and swelling to his left arm. The patient states that the symptoms started 2 days ago. The day prior he had been moving furniture but does not recall any specific trauma to his arm. He says that since the bruising and swelling began he has noticed that the bruising has been traveling down his arm. This has been associated with some discomfort. The patient has limited ROM of his left elbow at baseline from previous injury and says his ROM today is at his baseline. The patient denies any shortness of breath or weakness. He does take blood thinning medication.  Records reviewed. Per medical record review patient has a history of HLD, HTN, atrial flutter. On warfarin.   Past Medical History:  Diagnosis Date  . Asthma   . Atrial flutter ([place]) 2007  . Band keratopathy   . Benign prostatic hypertrophy   . Chronic ear infection   . Chronic kidney disease   . Chronic prostatitis   . Dental bridge present    permanent - upper  . Diabetes mellitus   . Elbow stiffness, left    s/p fracture many yrs ago.  arm does not straighten  . GERD (gastroesophageal reflux disease)    laryngeal involvement  . Hyperlipidemia   . Hypertension   . Obstructive sleep apnea    CPAP-9  . Osteoarthrosis, localized, primary, knee    post-traumatic  . Prostatitis   . Stroke [facility])    TIA  . Tachy-brady syndrome ([place])   . UTI (urinary tract infection)     Patient Active Problem List   Diagnosis Date Noted  . Hematoma 03/06/2020  . Chronic diastolic heart failure ([place]) 10/25/2019  . Hypercalcemia 10/25/2019  . Venous stasis ulcer  ([place]) 08/20/2019  . Chondrocalcinosis 09/05/2018  . Primary osteoarthritis of right knee 08/19/2018  . Aortic atherosclerosis ([place]) 12/28/2016  . Problems with swallowing and mastication   . Chronic venous insufficiency 04/12/2016  . Thrombocytopenia ([place]) 06/25/2015  . Obstructive sleep apnea 06/25/2015  . Enlarged prostate without lower urinary tract symptoms ([place]) 10/14/2014  . Advanced directives, counseling/discussion 12/13/2013  . Uncomplicated asthma 75/17/0017  . Encounter for therapeutic drug monitoring 01/30/2013  . Routine general medical examination at a health care facility 12/11/2012  . TIA (transient ischemic attack) 12/11/2012  . Ventricular tachycardia-pause dependent 10/16/2012  . Sinus bradycardia 09/07/2012  . Bradycardia 09/07/2012  . Edema 09/07/2012  . Localized edema 09/07/2012  . Mixed hyperlipidemia   . Abnormal EKG 09/26/2011  . Atrial flutter ([place]) 07/21/2010  . Encounter for anticoagulation discussion and counseling 07/21/2010  . Type 2 diabetes, controlled, with neuropathy ([place])   . Tachy-brady syndrome ([place])   . BPH with obstruction/lower urinary tract symptoms   . Unspecified asthma(493.90)   . GERD (gastroesophageal reflux disease)   . Essential hypertension     Past Surgical History:  Procedure Laterality Date  . APPENDECTOMY    . BELPHAROPTOSIS REPAIR     Dr [name]---didn't resolve weepy eye and eyelid drooping  . CARDIOVERSION  04/13/2010  . CATARACT EXTRACTION, BILATERAL  2009  . Chest pain  8/12   Stress test benign  . ESOPHAGEAL DILATION  05/26/2016  Procedure: ESOPHAGEAL DILATION;  Surgeon: Lucilla Lame, MD;  Location: [place];  Service: Endoscopy;;  . ESOPHAGOGASTRODUODENOSCOPY (EGD) WITH PROPOFOL N/A 05/26/2016   Procedure: ESOPHAGOGASTRODUODENOSCOPY (EGD) WITH PROPOFOL;  Surgeon: Lucilla Lame, MD;  Location: [place];  Service: Endoscopy;  Laterality: N/A;  Diabetic - oral meds sleep apnea  . EYE SURGERY     . FRACTURE SURGERY Left    elbow  . KNEE SURGERY  1998   plate after fracture, then removed for infection  . left elbow surgery    . MASTOIDECTOMY  8/08   Dr Idelle Crouch  . RHINOPLASTY  5/10   and septoplasty  . SUBACROMIAL DECOMPRESSION Right 2005   Arthroscopic (for rotator cuff and biceps tendon ruptures)  . TEAR DUCT PROBING  11/13   Dr Vickki Muff  . TOTAL KNEE ARTHROPLASTY Left 09/01/2014   Procedure: TOTAL KNEE ARTHROPLASTY;  Surgeon: Dereck Leep, MD;  Location: [facility];  Service: Orthopedics;  Laterality: Left;  . TRIGGER FINGER RELEASE Left 02/25/2015   Procedure: LEFT LONG TRIGGER RELEASE;  Surgeon: Dereck Leep, MD;  Location: [facility];  Service: Orthopedics;  Laterality: Left;  Marland Kitchen VENOUS ABLATION      Prior to Admission medications   Medication Sig Start Date End Date Taking? Authorizing Provider  APPLE CIDER VINEGAR PO Take by mouth.    [provider]  atorvastatin (LIPITOR) 80 MG tablet TAKE 1 TABLET BY MOUTH  DAILY 11/18/19   Venia Carbon, MD  cetirizine (ZYRTEC) 10 MG tablet Take 10 mg by mouth daily.    [provider]  Cyanocobalamin (B-12) 2500 MCG SUBL Place under the tongue.    [provider]  docusate sodium (COLACE) 100 MG capsule Take 100 mg by mouth 2 (two) times daily.    [provider]  famotidine (PEPCID) 20 MG tablet Take 20 mg by mouth daily.    [provider]  finasteride (PROSCAR) 5 MG tablet TAKE 1 TABLET BY MOUTH ONCE DAILY 10/28/19   Venia Carbon, MD  furosemide (LASIX) 20 MG tablet Take 1 tablet (20 mg total) by mouth daily as needed (Extra pill as needed for weight 215 or higher). Once daily and extra pill as needed for weight 215 or higher. 02/24/20 05/24/20  [name] I, MD  glucose blood (BAYER CONTOUR TEST) test strip USE STRIP TO TEST BLOOD SUGAR ONCE DAILY. Dx Code: E11.40 02/25/19   Venia Carbon, MD  ibuprofen (ADVIL,MOTRIN) 200 MG tablet Take 400 mg by mouth 2 (two) times  daily.     [provider]  loratadine (CLARITIN) 10 MG tablet Take 10 mg by mouth daily.    [provider]  metFORMIN (GLUCOPHAGE) 500 MG tablet TAKE 1 TABLET BY MOUTH  TWICE DAILY WITH MEALS 10/28/19   Venia Carbon, MD  Microlet Lancets MISC Use to test twice daily or as directed 02/15/19   [name] I, MD  tamsulosin (FLOMAX) 0.4 MG CAPS capsule TAKE 1 CAPSULE BY MOUTH  DAILY 10/28/19   Venia Carbon, MD  triamterene-hydrochlorothiazide (DYAZIDE) 37.5-25 MG capsule TAKE 1 CAPSULE BY MOUTH IN  THE MORNING 11/18/19   Venia Carbon, MD  warfarin (COUMADIN) 5 MG tablet TAKE 1 TO 1 AND A 1/2 TABLETS BY  MOUTH DAILY AS DIRECTED BY  COUMADIN CLINIC 01/02/20   Minna Merritts, MD    Allergies Enoxaparin, Enoxaparin sodium, Latex, Nickel, Percocet [oxycodone-acetaminophen], and Tape  Family History  Problem Relation Age of Onset  .  Colon cancer Father   . Diabetes Father   . Hypertension Father   . Other Mother        natural causes  . Heart attack Neg Hx   . Stroke Neg Hx     Social History Social History   Tobacco Use  . Smoking status: Former Smoker    Packs/day: 1.00    Years: 0.00    Pack years: 0.00    Types: Cigarettes    Quit date: 01/03/1969    Years since quitting: 51.2  . Smokeless tobacco: Never Used  Substance Use Topics  . Alcohol use: No    Alcohol/week: 1.0 standard drink    Types: 1 Glasses of wine per week    Comment: rare wine. 1-2x/yr.  . Drug use: No    Review of Systems Constitutional: No fever/chills Eyes: No visual changes. ENT: No sore throat. Cardiovascular: Denies chest pain. Respiratory: Denies shortness of breath. Gastrointestinal: No abdominal pain.  No nausea, no vomiting.  No diarrhea.   Genitourinary: Negative for dysuria. Musculoskeletal: Positive for left arm swelling and pain. Skin: Positive for bruising to left upper arm.  Neurological: Negative for headaches, focal weakness or  numbness.  ____________________________________________   PHYSICAL EXAM:  VITAL SIGNS: ED Triage Vitals  Enc Vitals Group     BP 03/08/20 1211 129/77     Pulse Rate 03/08/20 1211 80     Resp 03/08/20 1211 18     Temp 03/08/20 1211 98.4 F (36.9 C)     Temp Source 03/08/20 1211 Oral     SpO2 03/08/20 1211 98 %     Weight 03/08/20 1212 206 lb (93.4 kg)     Height 03/08/20 1212 5\' 9"  (1.753 m)     Head Circumference --      Peak Flow --      Pain Score 03/08/20 1212 8   Constitutional: Alert and oriented.  Eyes: Conjunctivae are normal.  ENT      Head: Normocephalic and atraumatic.      Nose: No congestion/rhinnorhea.      Mouth/Throat: Mucous membranes are moist.      Neck: No stridor. Hematological/Lymphatic/Immunilogical: No cervical lymphadenopathy. Cardiovascular: Normal rate.   Respiratory: Normal respiratory effort without tachypnea nor retractions. Breath sounds are clear and equal bilaterally. No wheezes/rales/rhonchi. Gastrointestinal: Soft and non tender. No rebound. No guarding.  Genitourinary: Deferred Musculoskeletal: Left upper extremity with swelling and bruising to the left upper arm, some bruising and swelling to proximal left forearm. Compartments soft. Radial and ulnar pulse 2+. Extremity is not cold. No erythema.  Neurologic:  Normal speech and language. No gross focal neurologic deficits are appreciated.  Skin:  Ecchymosis noted to the left upper arm and left proximal forearm.  Psychiatric: Mood and affect are normal. Speech and behavior are normal. Patient exhibits appropriate insight and judgment.  ____________________________________________    LABS (pertinent positives/negatives)  INR 1.9 CMP wnl except glu 267, BUN 28, t bili 1.5 CBC wbc 8.0, hgb 13.1, plt 127  ____________________________________________   EKG  None  ____________________________________________     RADIOLOGY  None  ____________________________________________   PROCEDURES  Procedures  ____________________________________________   INITIAL IMPRESSION / ASSESSMENT AND PLAN / ED COURSE  Pertinent labs & imaging results that were available during my care of the patient were reviewed by me and considered in my medical decision making (see chart for details).   Patient presented to the emergency department today because of concern for swelling and bruising to left  upper extremity. Exam is consistent with hematoma and ecchymosis. No symptoms concerning for compartment syndrome at this time. Patient neither hypotensive nor tachycardic. Blood work without anemia. At this time do think it is reasonable for patient to be discharged home. Discussed care.   ____________________________________________   FINAL CLINICAL IMPRESSION(S) / ED DIAGNOSES  Final diagnoses:  Bruising  Hematoma     Note: This dictation was prepared with Dragon dictation. Any transcriptional errors that result from this process are unintentional     Nance Pear, MD 03/09/20 1526

## 2020-03-10 ENCOUNTER — Telehealth: Payer: Self-pay

## 2020-03-10 ENCOUNTER — Ambulatory Visit: Payer: BLUE CROSS/BLUE SHIELD | Admitting: Primary Care

## 2020-03-10 DIAGNOSIS — Z0289 Encounter for other administrative examinations: Secondary | ICD-10-CM

## 2020-03-10 NOTE — Telephone Encounter (Signed)
Pt went to ER for bruising and swelling to his left arm 03-08-20. Trying to ice an elevate. Did see Anda Kraft 03-06-20. EMT on site wrapped it yesterday for him. He is keeping an eye on it. He goes to see cardiology for PT/INR tomorrow. He wants to get off coumadin and try something else. He will discuss that with them.

## 2020-03-10 NOTE — Telephone Encounter (Signed)
Spoke w/ pt.  He reports that his INR was 3.5 in our office last Wednsesday, that it as "2-something" in his PCP's office the next day and 1.9 in the ED on 03/08/20. He went to the ED for "ruptured tricep head" and would like to be seen tomorrow so see if his meds need to be adjusted. Pt sched to see me in [place] office tomorrow @ 8:30.

## 2020-03-10 NOTE — Telephone Encounter (Signed)
Patient calling  States that his INR number jumping up and down  Patient is concerned Please call to discuss

## 2020-03-11 ENCOUNTER — Ambulatory Visit (INDEPENDENT_AMBULATORY_CARE_PROVIDER_SITE_OTHER): Payer: Medicare Other

## 2020-03-11 ENCOUNTER — Other Ambulatory Visit: Payer: Self-pay

## 2020-03-11 DIAGNOSIS — Z5181 Encounter for therapeutic drug level monitoring: Secondary | ICD-10-CM

## 2020-03-11 DIAGNOSIS — I4892 Unspecified atrial flutter: Secondary | ICD-10-CM

## 2020-03-11 LAB — POCT INR: INR: 1.8 — AB (ref 2.0–3.0)

## 2020-03-11 NOTE — Patient Instructions (Signed)
-   take 1.5 tablets warfarin tonight, then  - continue warfarin dosage of 1 tablets daily except 1.5 tablet on SUNDAYS, TUESDAYS & THURSDAYS.   - Recheck in 2 weeks.   Please be consistent with your green intake - please pick a day or days each week to have your greens and have them every week on that day (your pills are on a schedule, so your greens need to be on one)

## 2020-03-16 LAB — HM DIABETES EYE EXAM

## 2020-03-16 NOTE — Progress Notes (Signed)
Date:  03/17/2020   ID:  Michael Colon, DOB 1930-08-05, MRN [id number]  Patient Location:  [place] [address]   Provider location:   [facility], [place] office  PCP:  Venia Carbon, MD  Cardiologist:  Arvid Right [facility]  Chief Complaint  Patient presents with  . other    Bruising on left arm, medications reviewed w/pt     History of Present Illness:    Michael Colon is a 87 y.o. male  past medical history of CT calcium score 1300,  atrial flutter (3/14), previous ablation , on warfarin,  hypertension,  borderline diabetes,  Obstructive sleep apnea  [facility] on August 04, 2010 with chest pain.  Symptoms started after eating and it was felt he had GI spasm or hiatal hernia. Mild improvement in symptoms with nitroglycerin and GI cocktail in the emergency room. He is very active at baseline.  He reports having a TIA some time in 2014, workup negative at [facility] Previous vein ablation surgery at [facility] Underlying diabetes type 2 S/p  right great saphenous vein and small saphenous vein laser ablation. His postprocedural duplex showed successful ablation without DVT. He presents today for follow-up of his atrial flutter, hyperlipidemia  12 days ago, lifting furniture Bruising left arm, Hematoma from shoulder to finger tips Turning yellow and green Has been wrapping it since last Friday  Has not seen orthopedics  HGB 13.1 platelets  Denies any significant leg swelling, wears compression hose No shortness of breath, no chest pain Reports weight is stable Denies excessive fluid intake Previously with Unna wraps for fluid retention, leg swelling, has been doing this on his own with compression hose  Denies orthostasis, no regular exercise program  labs HAB1C down to 6.5 Total chol 110, LDL 36   Records reviewed echocardiogram October 2021 Normal ejection fraction asymmetric left ventricular  hypertrophy of the basal-septal  segment Aorta 38 mm Mildly elevated right heart pressures  Lab work reviewed most recent creatinine 0.94 BUN 31, improvement from September BUN 42 creatinine 1.01  Chronic leg swelling worse on the left leg than the right secondary to previous knee surgeries   Prior CV studies:   The following studies were reviewed today:  Previously evaluated for bradycardia. Pacemaker was not recommended at that time. Previously seen by Dr. Caryl Comes, EP  Previous Holter monitor showed normal sinus rhythm with pauses up to 2.86 seconds, bradycardia with heart rates in the 20s to 30s at nighttime, 30s to 50 during the daytime.  Hemoglobin A1c in December 2014 7.5, total cholesterol up from 132 now 200  CT calcium score 1300,  stress test 04/2015 Following the CT scan, no ischemia   Past Medical History:  Diagnosis Date  . Asthma   . Atrial flutter ([place]) 2007  . Band keratopathy   . Benign prostatic hypertrophy   . Chronic ear infection   . Chronic kidney disease   . Chronic prostatitis   . Dental bridge present    permanent - upper  . Diabetes mellitus   . Elbow stiffness, left    s/p fracture many yrs ago.  arm does not straighten  . GERD (gastroesophageal reflux disease)    laryngeal involvement  . Hyperlipidemia   . Hypertension   . Obstructive sleep apnea    CPAP-9  . Osteoarthrosis, localized, primary, knee    post-traumatic  . Prostatitis   . Stroke [facility])    TIA  . Tachy-brady syndrome ([place])   .  UTI (urinary tract infection)    Past Surgical History:  Procedure Laterality Date  . APPENDECTOMY    . BELPHAROPTOSIS REPAIR     Dr [name]---didn't resolve weepy eye and eyelid drooping  . CARDIOVERSION  04/13/2010  . CATARACT EXTRACTION, BILATERAL  2009  . Chest pain  8/12   Stress test benign  . ESOPHAGEAL DILATION  05/26/2016   Procedure: ESOPHAGEAL DILATION;  Surgeon: Lucilla Lame, MD;  Location: [place];  Service: Endoscopy;;  . ESOPHAGOGASTRODUODENOSCOPY (EGD)  WITH PROPOFOL N/A 05/26/2016   Procedure: ESOPHAGOGASTRODUODENOSCOPY (EGD) WITH PROPOFOL;  Surgeon: Lucilla Lame, MD;  Location: [place];  Service: Endoscopy;  Laterality: N/A;  Diabetic - oral meds sleep apnea  . EYE SURGERY    . FRACTURE SURGERY Left    elbow  . KNEE SURGERY  1998   plate after fracture, then removed for infection  . left elbow surgery    . MASTOIDECTOMY  8/08   Dr Idelle Crouch  . RHINOPLASTY  5/10   and septoplasty  . SUBACROMIAL DECOMPRESSION Right 2005   Arthroscopic (for rotator cuff and biceps tendon ruptures)  . TEAR DUCT PROBING  11/13   Dr Vickki Muff  . TOTAL KNEE ARTHROPLASTY Left 09/01/2014   Procedure: TOTAL KNEE ARTHROPLASTY;  Surgeon: Dereck Leep, MD;  Location: [facility]S;  Service: Orthopedics;  Laterality: Left;  . TRIGGER FINGER RELEASE Left 02/25/2015   Procedure: LEFT LONG TRIGGER RELEASE;  Surgeon: Dereck Leep, MD;  Location: [facility] OR[facility];  Service: Orthopedics;  Laterality: Left;  Marland Kitchen VENOUS ABLATION       No outpatient medications have been marked as taking for the 03/17/20 encounter (Office Visit) with Minna Merritts, MD.     Allergies:   Enoxaparin, Enoxaparin sodium, Latex, Nickel, Percocet [oxycodone-acetaminophen], and Tape   Social History   Tobacco Use  . Smoking status: Former Smoker    Packs/day: 1.00    Years: 0.00    Pack years: 0.00    Types: Cigarettes    Quit date: 01/03/1969    Years since quitting: 51.2  . Smokeless tobacco: Never Used  Substance Use Topics  . Alcohol use: No    Alcohol/week: 1.0 standard drink    Types: 1 Glasses of wine per week    Comment: rare wine. 1-2x/yr.  . Drug use: No     Family Hx: The patient's family history includes Colon cancer in his father; Diabetes in his father; Hypertension in his father; Other in his mother. There is no history of Heart attack or Stroke.  ROS:   Please see the history of present illness.    Review of Systems  Constitutional: Negative.   HENT: Negative.    Respiratory: Negative.   Cardiovascular: Negative.   Gastrointestinal: Negative.   Musculoskeletal: Negative.   Skin:       Ecchymotic swelling left arm  Neurological: Negative.   Psychiatric/Behavioral: Negative.   All other systems reviewed and are negative.    Labs/Other Tests and Data Reviewed:    Recent Labs: 09/16/2019: Pro B Natriuretic peptide (BNP) 270.0 03/08/2020: ALT 17; BUN 28; Creatinine, Ser 0.87; Hemoglobin 13.1; Platelets 127; Potassium 3.7; Sodium 135   Recent Lipid Panel Lab Results  Component Value Date/Time   CHOL 120 02/21/2020 10:48 AM   TRIG 56.0 02/21/2020 10:48 AM   HDL 72.20 02/21/2020 10:48 AM   CHOLHDL 2 02/21/2020 10:48 AM   LDLCALC 36 02/21/2020 10:48 AM    Wt Readings from Last 3 Encounters:  03/17/20 209  lb 3.2 oz (94.9 kg)  03/08/20 206 lb (93.4 kg)  03/06/20 211 lb (95.7 kg)     Exam:    BP 136/72 (BP Location: Right Arm, Patient Position: Sitting, Cuff Size: Normal)   Pulse 71   Wt 209 lb 3.2 oz (94.9 kg)   SpO2 96%   BMI 30.89 kg/m  Constitutional:  oriented to person, place, and time. No distress.  HENT:  Head: Grossly normal Eyes:  no discharge. No scleral icterus.  Neck: No JVD, no carotid bruits  Cardiovascular: Regular rate and rhythm, no murmurs appreciated Pulmonary/Chest: Clear to auscultation bilaterally, no wheezes or rails Abdominal: Soft.  no distension.  no tenderness.  Musculoskeletal: Normal range of motion Left arm markedly swollen, hematoma on forearm, ecchymosis from shoulder down to fingers Neurological:  normal muscle tone. Coordination normal. No atrophy Skin: Skin warm and dry Psychiatric: normal affect, pleasant  ASSESSMENT & PLAN:    Left arm hematoma 12 days ago, trauma to left arm lifting furniture, likely torn biceps or triceps Has not seen orthopedics Significant swelling from hematoma, bleed in the setting of INR 3.5 Looks like slowly improving, recommended continued wrap, icing, arm  elevation If he has any recurrent injury recommend he stop warfarin until trauma resolved  Atrial fibrillation/flutter On anticoagulation, recent bleed as above  Chronic diastolic CHF Goal weight 449, typically takes extra Lasix for weight 215 Appears relatively euvolemic on today's visit  Tachy-brady syndrome (HCC) Stable Asymptomatic from his bradycardia, No further work-up  Mixed hyperlipidemia Cholesterol is at goal on the current lipid regimen. No changes to the medications were made.  Essential hypertension Blood pressure is well controlled on today's visit. No changes made to the medications.  Type 2 diabetes, controlled, with neuropathy (HCC) Improved HBA1C 6.3 Weight trrending down  Aortic atherosclerosis (HCC) Cholesterol is at goal on the current lipid regimen. No changes to the medications were made.  Obstructive sleep apnea On cpap, weight down Followed by pulmonary    Total encounter time more than 25 minutes  Greater than 50% was spent in counseling and coordination of care with the patient   Signed, Ida Rogue, MD  03/17/2020 11:34 AM    [place] Office [address], [place], [place] [address]

## 2020-03-17 ENCOUNTER — Encounter: Payer: Self-pay | Admitting: Cardiovascular Disease

## 2020-03-17 ENCOUNTER — Other Ambulatory Visit: Payer: Self-pay

## 2020-03-17 ENCOUNTER — Ambulatory Visit (INDEPENDENT_AMBULATORY_CARE_PROVIDER_SITE_OTHER): Payer: Medicare Other | Admitting: Cardiovascular Disease

## 2020-03-17 VITALS — BP 136/72 | HR 71 | Wt 209.2 lb

## 2020-03-17 DIAGNOSIS — E785 Hyperlipidemia, unspecified: Secondary | ICD-10-CM

## 2020-03-17 DIAGNOSIS — I2510 Atherosclerotic heart disease of native coronary artery without angina pectoris: Secondary | ICD-10-CM

## 2020-03-17 DIAGNOSIS — I340 Nonrheumatic mitral (valve) insufficiency: Secondary | ICD-10-CM

## 2020-03-17 DIAGNOSIS — I4892 Unspecified atrial flutter: Secondary | ICD-10-CM | POA: Diagnosis not present

## 2020-03-17 DIAGNOSIS — I509 Heart failure, unspecified: Secondary | ICD-10-CM | POA: Diagnosis not present

## 2020-03-17 DIAGNOSIS — I10 Essential (primary) hypertension: Secondary | ICD-10-CM

## 2020-03-17 DIAGNOSIS — G4733 Obstructive sleep apnea (adult) (pediatric): Secondary | ICD-10-CM

## 2020-03-17 DIAGNOSIS — M7989 Other specified soft tissue disorders: Secondary | ICD-10-CM

## 2020-03-17 DIAGNOSIS — I77810 Thoracic aortic ectasia: Secondary | ICD-10-CM

## 2020-03-17 NOTE — Patient Instructions (Signed)
Medication Instructions:  No changes  If you need a refill on your cardiac medications before your next appointment, please call your pharmacy.    Lab work: No new labs needed   If you have labs (blood work) drawn today and your tests are completely normal, you will receive your results only by: . MyChart Message (if you have MyChart) OR . A paper copy in the mail If you have any lab test that is abnormal or we need to change your treatment, we will call you to review the results.   Testing/Procedures: No new testing needed   Follow-Up: At [facility], you and your health needs are our priority.  As part of our continuing mission to provide you with exceptional heart care, we have created designated Provider Care Teams.  These Care Teams include your primary Cardiologist (physician) and Advanced Practice Providers (APPs -  Physician Assistants and Nurse Practitioners) who all work together to provide you with the care you need, when you need it.  . You will need a follow up appointment in 6 months  . Providers on your designated Care Team:   . Christopher Berge, NP . Ryan Dunn, PA-C . Jacquelyn Visser, PA-C  Any Other Special Instructions Will Be Listed Below (If Applicable).  COVID-19 Vaccine Information can be found at: [email or web address] For questions related to vaccine distribution or appointments, please email [email or web address] or call [phone].     

## 2020-03-25 ENCOUNTER — Other Ambulatory Visit: Payer: Self-pay

## 2020-03-25 ENCOUNTER — Ambulatory Visit (INDEPENDENT_AMBULATORY_CARE_PROVIDER_SITE_OTHER): Payer: Medicare Other

## 2020-03-25 DIAGNOSIS — Z5181 Encounter for therapeutic drug level monitoring: Secondary | ICD-10-CM

## 2020-03-25 DIAGNOSIS — I4892 Unspecified atrial flutter: Secondary | ICD-10-CM

## 2020-03-25 LAB — POCT INR: INR: 2.4 (ref 2.0–3.0)

## 2020-03-25 NOTE — Patient Instructions (Signed)
-   continue warfarin dosage of 1 tablets daily except 1.5 tablet on SUNDAYS, TUESDAYS & THURSDAYS.   - Recheck in 3 weeks.   Please be consistent with your green intake - please pick a day or days each week to have your greens and have them every week on that day (your pills are on a schedule, so your greens need to be on one)

## 2020-04-06 ENCOUNTER — Telehealth: Payer: Self-pay | Admitting: Cardiovascular Disease

## 2020-04-06 ENCOUNTER — Other Ambulatory Visit: Payer: Self-pay | Admitting: Cardiovascular Disease

## 2020-04-06 MED ORDER — WARFARIN SODIUM 5 MG PO TABS: ORAL_TABLET | ORAL | 1 refills | Status: DC

## 2020-04-06 NOTE — Telephone Encounter (Signed)
Sent in warfarin 5 mg #10 w/ 1 refill to [facility].

## 2020-04-06 NOTE — Telephone Encounter (Signed)
Please review for refill. Thanks!  

## 2020-04-06 NOTE — Telephone Encounter (Signed)
*  STAT* If patient is at the pharmacy, call can be transferred to refill team.   1. Which medications need to be refilled? (please list name of each medication and dose if known) warfarin  2. Which pharmacy/location (including street and city if local pharmacy) is medication to be sent to? [facility] on [address]  3. Do they need a 30 day or 90 day supply? 1 weeks worth, patient is waiting on his order from mail order Rx that is expected to ship out today

## 2020-04-14 DIAGNOSIS — M19122 Post-traumatic osteoarthritis, left elbow: Secondary | ICD-10-CM | POA: Diagnosis not present

## 2020-04-14 DIAGNOSIS — M7022 Olecranon bursitis, left elbow: Secondary | ICD-10-CM | POA: Diagnosis not present

## 2020-04-14 DIAGNOSIS — M65342 Trigger finger, left ring finger: Secondary | ICD-10-CM | POA: Diagnosis not present

## 2020-04-14 DIAGNOSIS — S46312D Strain of muscle, fascia and tendon of triceps, left arm, subsequent encounter: Secondary | ICD-10-CM | POA: Diagnosis not present

## 2020-04-14 DIAGNOSIS — S40022D Contusion of left upper arm, subsequent encounter: Secondary | ICD-10-CM | POA: Diagnosis not present

## 2020-04-14 DIAGNOSIS — M25422 Effusion, left elbow: Secondary | ICD-10-CM | POA: Diagnosis not present

## 2020-04-15 ENCOUNTER — Other Ambulatory Visit: Payer: Self-pay

## 2020-04-15 ENCOUNTER — Ambulatory Visit (INDEPENDENT_AMBULATORY_CARE_PROVIDER_SITE_OTHER): Payer: Medicare Other

## 2020-04-15 DIAGNOSIS — I4892 Unspecified atrial flutter: Secondary | ICD-10-CM

## 2020-04-15 DIAGNOSIS — Z5181 Encounter for therapeutic drug level monitoring: Secondary | ICD-10-CM

## 2020-04-15 LAB — POCT INR: INR: 1.7 — AB (ref 2.0–3.0)

## 2020-04-15 NOTE — Patient Instructions (Signed)
-   take 2 tablets warfarin tonight, then  - continue warfarin dosage of 1 tablets daily except 1.5 tablet on SUNDAYS, TUESDAYS & THURSDAYS.   - Recheck in 3 weeks.   Please be consistent with your green intake - please pick a day or days each week to have your greens and have them every week on that day (your pills are on a schedule, so your greens need to be on one)

## 2020-04-21 DIAGNOSIS — D2262 Melanocytic nevi of left upper limb, including shoulder: Secondary | ICD-10-CM | POA: Diagnosis not present

## 2020-04-21 DIAGNOSIS — I872 Venous insufficiency (chronic) (peripheral): Secondary | ICD-10-CM | POA: Diagnosis not present

## 2020-04-21 DIAGNOSIS — D225 Melanocytic nevi of trunk: Secondary | ICD-10-CM | POA: Diagnosis not present

## 2020-04-21 DIAGNOSIS — D2272 Melanocytic nevi of left lower limb, including hip: Secondary | ICD-10-CM | POA: Diagnosis not present

## 2020-04-21 DIAGNOSIS — Z85828 Personal history of other malignant neoplasm of skin: Secondary | ICD-10-CM | POA: Diagnosis not present

## 2020-04-21 DIAGNOSIS — D2261 Melanocytic nevi of right upper limb, including shoulder: Secondary | ICD-10-CM | POA: Diagnosis not present

## 2020-04-23 DIAGNOSIS — M6281 Muscle weakness (generalized): Secondary | ICD-10-CM | POA: Diagnosis not present

## 2020-04-23 DIAGNOSIS — S40022D Contusion of left upper arm, subsequent encounter: Secondary | ICD-10-CM | POA: Diagnosis not present

## 2020-04-23 DIAGNOSIS — M25422 Effusion, left elbow: Secondary | ICD-10-CM | POA: Diagnosis not present

## 2020-04-23 DIAGNOSIS — M25522 Pain in left elbow: Secondary | ICD-10-CM | POA: Diagnosis not present

## 2020-04-23 DIAGNOSIS — M19122 Post-traumatic osteoarthritis, left elbow: Secondary | ICD-10-CM | POA: Diagnosis not present

## 2020-04-23 DIAGNOSIS — M7022 Olecranon bursitis, left elbow: Secondary | ICD-10-CM | POA: Diagnosis not present

## 2020-04-23 DIAGNOSIS — S46312D Strain of muscle, fascia and tendon of triceps, left arm, subsequent encounter: Secondary | ICD-10-CM | POA: Diagnosis not present

## 2020-04-28 DIAGNOSIS — M7022 Olecranon bursitis, left elbow: Secondary | ICD-10-CM | POA: Diagnosis not present

## 2020-04-28 DIAGNOSIS — M19122 Post-traumatic osteoarthritis, left elbow: Secondary | ICD-10-CM | POA: Diagnosis not present

## 2020-04-28 DIAGNOSIS — M25422 Effusion, left elbow: Secondary | ICD-10-CM | POA: Diagnosis not present

## 2020-04-28 DIAGNOSIS — S46312D Strain of muscle, fascia and tendon of triceps, left arm, subsequent encounter: Secondary | ICD-10-CM | POA: Diagnosis not present

## 2020-04-28 DIAGNOSIS — S40022D Contusion of left upper arm, subsequent encounter: Secondary | ICD-10-CM | POA: Diagnosis not present

## 2020-04-28 DIAGNOSIS — M25522 Pain in left elbow: Secondary | ICD-10-CM | POA: Diagnosis not present

## 2020-04-28 DIAGNOSIS — M6281 Muscle weakness (generalized): Secondary | ICD-10-CM | POA: Diagnosis not present

## 2020-05-01 DIAGNOSIS — M25522 Pain in left elbow: Secondary | ICD-10-CM | POA: Diagnosis not present

## 2020-05-01 DIAGNOSIS — S40022D Contusion of left upper arm, subsequent encounter: Secondary | ICD-10-CM | POA: Diagnosis not present

## 2020-05-01 DIAGNOSIS — M19122 Post-traumatic osteoarthritis, left elbow: Secondary | ICD-10-CM | POA: Diagnosis not present

## 2020-05-01 DIAGNOSIS — S46312D Strain of muscle, fascia and tendon of triceps, left arm, subsequent encounter: Secondary | ICD-10-CM | POA: Diagnosis not present

## 2020-05-01 DIAGNOSIS — M6281 Muscle weakness (generalized): Secondary | ICD-10-CM | POA: Diagnosis not present

## 2020-05-01 DIAGNOSIS — M7022 Olecranon bursitis, left elbow: Secondary | ICD-10-CM | POA: Diagnosis not present

## 2020-05-01 DIAGNOSIS — M25422 Effusion, left elbow: Secondary | ICD-10-CM | POA: Diagnosis not present

## 2020-05-06 ENCOUNTER — Other Ambulatory Visit: Payer: Self-pay

## 2020-05-06 ENCOUNTER — Ambulatory Visit (INDEPENDENT_AMBULATORY_CARE_PROVIDER_SITE_OTHER): Payer: Medicare Other

## 2020-05-06 DIAGNOSIS — Z5181 Encounter for therapeutic drug level monitoring: Secondary | ICD-10-CM

## 2020-05-06 DIAGNOSIS — I4892 Unspecified atrial flutter: Secondary | ICD-10-CM

## 2020-05-06 LAB — POCT INR: INR: 2.8 (ref 2.0–3.0)

## 2020-05-06 NOTE — Patient Instructions (Signed)
-   continue warfarin dosage of 1 tablets daily except 1.5 tablet on SUNDAYS, TUESDAYS & THURSDAYS.   - Recheck in 4 weeks.

## 2020-05-07 DIAGNOSIS — M7022 Olecranon bursitis, left elbow: Secondary | ICD-10-CM | POA: Diagnosis not present

## 2020-05-07 DIAGNOSIS — M19122 Post-traumatic osteoarthritis, left elbow: Secondary | ICD-10-CM | POA: Diagnosis not present

## 2020-05-07 DIAGNOSIS — S46312D Strain of muscle, fascia and tendon of triceps, left arm, subsequent encounter: Secondary | ICD-10-CM | POA: Diagnosis not present

## 2020-05-07 DIAGNOSIS — M25522 Pain in left elbow: Secondary | ICD-10-CM | POA: Diagnosis not present

## 2020-05-07 DIAGNOSIS — S40022D Contusion of left upper arm, subsequent encounter: Secondary | ICD-10-CM | POA: Diagnosis not present

## 2020-05-07 DIAGNOSIS — M6281 Muscle weakness (generalized): Secondary | ICD-10-CM | POA: Diagnosis not present

## 2020-05-07 DIAGNOSIS — M25422 Effusion, left elbow: Secondary | ICD-10-CM | POA: Diagnosis not present

## 2020-05-11 DIAGNOSIS — S40022D Contusion of left upper arm, subsequent encounter: Secondary | ICD-10-CM | POA: Diagnosis not present

## 2020-05-11 DIAGNOSIS — S46312D Strain of muscle, fascia and tendon of triceps, left arm, subsequent encounter: Secondary | ICD-10-CM | POA: Diagnosis not present

## 2020-05-11 DIAGNOSIS — M25422 Effusion, left elbow: Secondary | ICD-10-CM | POA: Diagnosis not present

## 2020-05-11 DIAGNOSIS — M19122 Post-traumatic osteoarthritis, left elbow: Secondary | ICD-10-CM | POA: Diagnosis not present

## 2020-05-11 DIAGNOSIS — M7022 Olecranon bursitis, left elbow: Secondary | ICD-10-CM | POA: Diagnosis not present

## 2020-05-12 DIAGNOSIS — M25522 Pain in left elbow: Secondary | ICD-10-CM | POA: Diagnosis not present

## 2020-05-12 DIAGNOSIS — M6281 Muscle weakness (generalized): Secondary | ICD-10-CM | POA: Diagnosis not present

## 2020-05-12 DIAGNOSIS — M19122 Post-traumatic osteoarthritis, left elbow: Secondary | ICD-10-CM | POA: Diagnosis not present

## 2020-05-12 DIAGNOSIS — S40022D Contusion of left upper arm, subsequent encounter: Secondary | ICD-10-CM | POA: Diagnosis not present

## 2020-05-12 DIAGNOSIS — S46312D Strain of muscle, fascia and tendon of triceps, left arm, subsequent encounter: Secondary | ICD-10-CM | POA: Diagnosis not present

## 2020-05-12 DIAGNOSIS — M25422 Effusion, left elbow: Secondary | ICD-10-CM | POA: Diagnosis not present

## 2020-05-12 DIAGNOSIS — M7022 Olecranon bursitis, left elbow: Secondary | ICD-10-CM | POA: Diagnosis not present

## 2020-05-14 DIAGNOSIS — M25522 Pain in left elbow: Secondary | ICD-10-CM | POA: Diagnosis not present

## 2020-05-14 DIAGNOSIS — S46312D Strain of muscle, fascia and tendon of triceps, left arm, subsequent encounter: Secondary | ICD-10-CM | POA: Diagnosis not present

## 2020-05-14 DIAGNOSIS — M7022 Olecranon bursitis, left elbow: Secondary | ICD-10-CM | POA: Diagnosis not present

## 2020-05-14 DIAGNOSIS — M6281 Muscle weakness (generalized): Secondary | ICD-10-CM | POA: Diagnosis not present

## 2020-05-14 DIAGNOSIS — S40022D Contusion of left upper arm, subsequent encounter: Secondary | ICD-10-CM | POA: Diagnosis not present

## 2020-05-14 DIAGNOSIS — M19122 Post-traumatic osteoarthritis, left elbow: Secondary | ICD-10-CM | POA: Diagnosis not present

## 2020-05-14 DIAGNOSIS — M25422 Effusion, left elbow: Secondary | ICD-10-CM | POA: Diagnosis not present

## 2020-05-15 ENCOUNTER — Other Ambulatory Visit: Payer: Self-pay

## 2020-05-15 ENCOUNTER — Ambulatory Visit
Admission: EM | Admit: 2020-05-15 | Discharge: 2020-05-15 | Disposition: A | Discharge status: Home / Self Care | Payer: Medicare Other | Attending: Emergency Medicine | Admitting: Emergency Medicine

## 2020-05-15 ENCOUNTER — Ambulatory Visit: Payer: Medicare Other | Admitting: Family Medicine

## 2020-05-15 DIAGNOSIS — L0390 Cellulitis, unspecified: Secondary | ICD-10-CM

## 2020-05-15 DIAGNOSIS — B029 Zoster without complications: Secondary | ICD-10-CM | POA: Diagnosis not present

## 2020-05-15 MED ORDER — VALACYCLOVIR HCL 1 G PO TABS: 1000.0000 mg | ORAL_TABLET | Freq: Three times a day (TID) | ORAL | 0 refills | Status: DC

## 2020-05-15 MED ORDER — MUPIROCIN 2 % EX OINT: 1.0000 "application " | TOPICAL_OINTMENT | Freq: Two times a day (BID) | CUTANEOUS | 0 refills | Status: DC

## 2020-05-15 MED ORDER — CEPHALEXIN 500 MG PO CAPS: 500.0000 mg | ORAL_CAPSULE | Freq: Four times a day (QID) | ORAL | 0 refills | Status: DC

## 2020-05-15 NOTE — ED Provider Notes (Signed)
Michael Colon    CSN: [id number] Arrival date & time: 05/15/20  1158      History   Chief Complaint Chief Complaint  Patient presents with  . Rash    HPI Michael Colon is a 87 y.o. male.   Patient presents with 5-day history of rash on the left side of his face and neck.  The rash is draining yellow fluid in some areas.  [facility] told him it looked like shingles.  He denies fever, chills, or other symptoms.  His medical history includes hypertension, diastolic heart failure, diabetes, TIA, chronic venous insufficiency, chronic kidney disease.  The history is provided by the patient and medical records.    Past Medical History:  Diagnosis Date  . Asthma   . Atrial flutter ([place]) 2007  . Band keratopathy   . Benign prostatic hypertrophy   . Chronic ear infection   . Chronic kidney disease   . Chronic prostatitis   . Dental bridge present    permanent - upper  . Diabetes mellitus   . Elbow stiffness, left    s/p fracture many yrs ago.  arm does not straighten  . GERD (gastroesophageal reflux disease)    laryngeal involvement  . Hyperlipidemia   . Hypertension   . Obstructive sleep apnea    CPAP-9  . Osteoarthrosis, localized, primary, knee    post-traumatic  . Prostatitis   . Stroke [facility])    TIA  . Tachy-brady syndrome ([place])   . UTI (urinary tract infection)     Patient Active Problem List   Diagnosis Date Noted  . Hematoma 03/06/2020  . Chronic diastolic heart failure ([place]) 10/25/2019  . Hypercalcemia 10/25/2019  . Venous stasis ulcer ([place]) 08/20/2019  . Chondrocalcinosis 09/05/2018  . Primary osteoarthritis of right knee 08/19/2018  . Aortic atherosclerosis ([place]) 12/28/2016  . Problems with swallowing and mastication   . Chronic venous insufficiency 04/12/2016  . Thrombocytopenia ([place]) 06/25/2015  . Obstructive sleep apnea 06/25/2015  . Enlarged prostate without lower urinary tract symptoms (luts) 10/14/2014  .  Advanced directives, counseling/discussion 12/13/2013  . Uncomplicated asthma 16/96/7893  . Encounter for therapeutic drug monitoring 01/30/2013  . Routine general medical examination at a health care facility 12/11/2012  . TIA (transient ischemic attack) 12/11/2012  . Ventricular tachycardia-pause dependent 10/16/2012  . Sinus bradycardia 09/07/2012  . Bradycardia 09/07/2012  . Edema 09/07/2012  . Localized edema 09/07/2012  . Mixed hyperlipidemia   . Abnormal EKG 09/26/2011  . Atrial flutter ([place]) 07/21/2010  . Encounter for anticoagulation discussion and counseling 07/21/2010  . Type 2 diabetes, controlled, with neuropathy ([place])   . Tachy-brady syndrome ([place])   . BPH with obstruction/lower urinary tract symptoms   . Unspecified asthma(493.90)   . GERD (gastroesophageal reflux disease)   . Essential hypertension     Past Surgical History:  Procedure Laterality Date  . APPENDECTOMY    . BELPHAROPTOSIS REPAIR     Dr [name]---didn't resolve weepy eye and eyelid drooping  . CARDIOVERSION  04/13/2010  . CATARACT EXTRACTION, BILATERAL  2009  . Chest pain  8/12   Stress test benign  . ESOPHAGEAL DILATION  05/26/2016   Procedure: ESOPHAGEAL DILATION;  Surgeon: Lucilla Lame, MD;  Location: [place];  Service: Endoscopy;;  . ESOPHAGOGASTRODUODENOSCOPY (EGD) WITH PROPOFOL N/A 05/26/2016   Procedure: ESOPHAGOGASTRODUODENOSCOPY (EGD) WITH PROPOFOL;  Surgeon: Lucilla Lame, MD;  Location: [place];  Service: Endoscopy;  Laterality: N/A;  Diabetic - oral meds  sleep apnea  . EYE SURGERY    . FRACTURE SURGERY Left    elbow  . KNEE SURGERY  1998   plate after fracture, then removed for infection  . left elbow surgery    . MASTOIDECTOMY  8/08   Dr Idelle Crouch  . RHINOPLASTY  5/10   and septoplasty  . SUBACROMIAL DECOMPRESSION Right 2005   Arthroscopic (for rotator cuff and biceps tendon ruptures)  . TEAR DUCT PROBING  11/13   Dr Vickki Muff  . TOTAL KNEE ARTHROPLASTY Left  09/01/2014   Procedure: TOTAL KNEE ARTHROPLASTY;  Surgeon: Dereck Leep, MD;  Location: [facility];  Service: Orthopedics;  Laterality: Left;  . TRIGGER FINGER RELEASE Left 02/25/2015   Procedure: LEFT LONG TRIGGER RELEASE;  Surgeon: Dereck Leep, MD;  Location: [facility];  Service: Orthopedics;  Laterality: Left;  Marland Kitchen VENOUS ABLATION         Home Medications    Prior to Admission medications   Medication Sig Start Date End Date Taking? Authorizing Provider  cephALEXin (KEFLEX) 500 MG capsule Take 1 capsule (500 mg total) by mouth 4 (four) times daily. 05/15/20  Yes Sharion Balloon, NP  mupirocin ointment (BACTROBAN) 2 % Apply 1 application topically 2 (two) times daily. 05/15/20  Yes Sharion Balloon, NP  valACYclovir (VALTREX) 1000 MG tablet Take 1 tablet (1,000 mg total) by mouth 3 (three) times daily. 05/15/20  Yes Sharion Balloon, NP  APPLE CIDER VINEGAR PO Take by mouth.    [provider]  atorvastatin (LIPITOR) 80 MG tablet TAKE 1 TABLET BY MOUTH  DAILY 11/18/19   Venia Carbon, MD  cetirizine (ZYRTEC) 10 MG tablet Take 10 mg by mouth daily.    [provider]  Cyanocobalamin (B-12) 2500 MCG SUBL Place under the tongue.    [provider]  docusate sodium (COLACE) 100 MG capsule Take 100 mg by mouth 2 (two) times daily.    [provider]  famotidine (PEPCID) 20 MG tablet Take 20 mg by mouth daily.    [provider]  finasteride (PROSCAR) 5 MG tablet TAKE 1 TABLET BY MOUTH ONCE DAILY 10/28/19   Venia Carbon, MD  furosemide (LASIX) 20 MG tablet Take 1 tablet (20 mg total) by mouth daily as needed (Extra pill as needed for weight 215 or higher). Once daily and extra pill as needed for weight 215 or higher. 02/24/20 05/24/20  [name] I, MD  glucose blood (BAYER CONTOUR TEST) test strip USE STRIP TO TEST BLOOD SUGAR ONCE DAILY. Dx Code: E11.40 02/25/19   Venia Carbon, MD  ibuprofen (ADVIL,MOTRIN) 200 MG tablet Take 400 mg by mouth 2  (two) times daily.     [provider]  loratadine (CLARITIN) 10 MG tablet Take 10 mg by mouth daily.    [provider]  metFORMIN (GLUCOPHAGE) 500 MG tablet TAKE 1 TABLET BY MOUTH  TWICE DAILY WITH MEALS 10/28/19   Venia Carbon, MD  Microlet Lancets MISC Use to test twice daily or as directed 02/15/19   Venia Carbon, MD  tamsulosin (FLOMAX) 0.4 MG CAPS capsule TAKE 1 CAPSULE BY MOUTH  DAILY 10/28/19   Venia Carbon, MD  triamterene-hydrochlorothiazide (DYAZIDE) 37.5-25 MG capsule TAKE 1 CAPSULE BY MOUTH IN  THE MORNING 11/18/19   [name] I, MD  warfarin (COUMADIN) 5 MG tablet TAKE 1 TO 1 & 1/2 (ONE & ONE-HALF) TABLETS BY MOUTH ONCE DAILY AS DIRECTED 04/06/20   Minna Merritts,  MD    Family History Family History  Problem Relation Age of Onset  . Colon cancer Father   . Diabetes Father   . Hypertension Father   . Other Mother        natural causes  . Heart attack Neg Hx   . Stroke Neg Hx     Social History Social History   Tobacco Use  . Smoking status: Former Smoker    Packs/day: 1.00    Years: 0.00    Pack years: 0.00    Types: Cigarettes    Quit date: 01/03/1969    Years since quitting: 51.3  . Smokeless tobacco: Never Used  Substance Use Topics  . Alcohol use: No    Alcohol/week: 1.0 standard drink    Types: 1 Glasses of wine per week    Comment: rare wine. 1-2x/yr.  . Drug use: No     Allergies   Enoxaparin, Enoxaparin sodium, Latex, Nickel, Percocet [oxycodone-acetaminophen], and Tape   Review of Systems Review of Systems  Constitutional: Negative for chills and fever.  Respiratory: Negative for cough and shortness of breath.   Cardiovascular: Negative for chest pain and palpitations.  Gastrointestinal: Negative for abdominal pain and vomiting.  Skin: Positive for rash. Negative for color change.  Neurological: Negative for weakness and numbness.  All other systems reviewed and are negative.    Physical Exam Triage  Vital Signs ED Triage Vitals  Enc Vitals Group     BP --      Pulse Rate 05/15/20 1258 (!) 43     Resp 05/15/20 1258 16     Temp 05/15/20 1258 98.6 F (37 C)     Temp Source 05/15/20 1258 Oral     SpO2 05/15/20 1258 95 %     Weight 05/15/20 1259 204 lb (92.5 kg)     Height 05/15/20 1259 5\' 10"  (1.778 m)     Head Circumference --      Peak Flow --      Pain Score 05/15/20 1259 6     Pain Loc --      Pain Edu? --      Excl. in GC? --    No data found.  Updated Vital Signs BP 132/66   Pulse (!) 43 Comment: pt sts his HR is always this low  Temp 98.6 F (37 C) (Oral)   Resp 16   Ht 5\' 10"  (1.778 m)   Wt 204 lb (92.5 kg)   SpO2 95%   BMI 29.27 kg/m   Visual Acuity Right Eye Distance:   Left Eye Distance:   Bilateral Distance:    Right Eye Near:   Left Eye Near:    Bilateral Near:     Physical Exam Vitals and nursing note reviewed.  Constitutional:      General: He is not in acute distress.    Appearance: He is well-developed.  HENT:     Head: Normocephalic and atraumatic.     Mouth/Throat:     Mouth: Mucous membranes are moist.  Eyes:     Conjunctiva/sclera: Conjunctivae normal.  Cardiovascular:     Rate and Rhythm: Normal rate and regular rhythm.     Heart sounds: Normal heart sounds.  Pulmonary:     Effort: Pulmonary effort is normal. No respiratory distress.     Breath sounds: Normal breath sounds.  Abdominal:     Palpations: Abdomen is soft.     Tenderness: There is no abdominal tenderness.  Musculoskeletal:  General: Normal range of motion.     Cervical back: Neck supple.  Skin:    General: Skin is warm and dry.     Findings: Rash present.     Comments: Rash on left lateral neck and side of face.  Some yellow crusting noted.  See pictures for details.  Neurological:     General: No focal deficit present.     Mental Status: He is alert and oriented to person, place, and time.  Psychiatric:        Mood and Affect: Mood normal.         Behavior: Behavior normal.          UC Treatments / Results  Labs (all labs ordered are listed, but only abnormal results are displayed) Labs Reviewed - No data to display  EKG   Radiology No results found.  Procedures Procedures (including critical care time)  Medications Ordered in UC Medications - No data to display  Initial Impression / Assessment and Plan / UC Course  I have reviewed the triage vital signs and the nursing notes.  Pertinent labs & imaging results that were available during my care of the patient were reviewed by me and considered in my medical decision making (see chart for details).   Herpes zoster, cellulitis.  GFR>60 on 03/08/20.  Treating with valacyclovir.  Education provided on shingles.  Also treating with mupirocin and Keflex.  Education on cellulitis provided.  Instructed patient to follow-up with his PCP next week for recheck of the area.  He agrees to plan of care.   Final Clinical Impressions(s) / UC Diagnoses   Final diagnoses:  Herpes zoster without complication  Cellulitis, unspecified cellulitis site     Discharge Instructions     Take the valacyclovir as directed.    Use the mupirocin ointment and take the antibiotic cephalexin as directed.    Follow up with your primary care provider next week.       ED Prescriptions    Medication Sig Dispense Auth. Provider   mupirocin ointment (BACTROBAN) 2 % Apply 1 application topically 2 (two) times daily. 22 g Sharion Balloon, NP   valACYclovir (VALTREX) 1000 MG tablet Take 1 tablet (1,000 mg total) by mouth 3 (three) times daily. 21 tablet Barkley Boards H, NP   cephALEXin (KEFLEX) 500 MG capsule Take 1 capsule (500 mg total) by mouth 4 (four) times daily. 28 capsule Sharion Balloon, NP     PDMP not reviewed this encounter.   Sharion Balloon, NP 05/15/20 1339

## 2020-05-15 NOTE — ED Triage Notes (Signed)
Pt reports having a rash on L side of face and behind L ear that he noticed on Monday. RN at [facility] advised him to come him for possible shingles.

## 2020-05-15 NOTE — Discharge Instructions (Signed)
Take the valacyclovir as directed.    Use the mupirocin ointment and take the antibiotic cephalexin as directed.    Follow up with your primary care provider next week.

## 2020-05-18 ENCOUNTER — Other Ambulatory Visit: Payer: Self-pay

## 2020-05-18 ENCOUNTER — Ambulatory Visit (INDEPENDENT_AMBULATORY_CARE_PROVIDER_SITE_OTHER): Payer: Medicare Other | Admitting: Family Medicine

## 2020-05-18 ENCOUNTER — Encounter: Payer: Self-pay | Admitting: Family Medicine

## 2020-05-18 DIAGNOSIS — B028 Zoster with other complications: Secondary | ICD-10-CM | POA: Diagnosis not present

## 2020-05-18 DIAGNOSIS — B029 Zoster without complications: Secondary | ICD-10-CM | POA: Insufficient documentation

## 2020-05-18 NOTE — Assessment & Plan Note (Signed)
L side in V3 and C2 distribution  No ocular involvement and no dizziness  Seen in ER on 3/6 Reviewed hospital records, lab results and studies in detail  Px course of valcyclovir and also keflex (for possible 2ndary bacterial infection) Overall making progress and pain is improved Rash is drying up with no excessive redness or swelling  He takes ibuprofen for pain (also on warfarin and claims his pcp is aware)  Has had zostavax in hte past but not shingrix  Disc rash care and s/s of infection  Update if not starting to improve in a week or if worsening

## 2020-05-18 NOTE — Patient Instructions (Addendum)
The shingles rash looks to be progressing/ drying out  Please let us know if you need something else for pain  Keep the rash clean and dry (gently)   Continue your current medicines   Take care of yourself   If your skin becomes more red or swollen or more drainage let us know

## 2020-05-18 NOTE — Progress Notes (Signed)
Subjective:    Patient ID: Michael Colon, male    DOB: December 26, 1930, 87 y.o.   MRN: [id number]  This visit occurred during the SARS-CoV-2 public health emergency.  Safety protocols were in place, including screening questions prior to the visit, additional usage of staff PPE, and extensive cleaning of exam room while observing appropriate contact time as indicated for disinfecting solutions.    HPI  Pt presents with c/o shingles   Wt Readings from Last 3 Encounters:  05/18/20 208 lb 1.6 oz (94.4 kg)  05/15/20 204 lb (92.5 kg)  03/17/20 209 lb 3.2 oz (94.9 kg)   29.86 kg/m  87 yo pt of Dr Silvio Pate   He was seen in the ER on 5/13 with a 5 day h/o rash on left side of face and neck  There was some fluid draining   A/P: Herpes zoster, cellulitis.  GFR>60 on 03/08/20.  Treating with valacyclovir.  Education provided on shingles.  Also treating with mupirocin and Keflex.  Education on cellulitis provided.  Instructed patient to follow-up with his PCP next week for recheck of the area.  He agrees to plan of care.  Pictures and notes from ER reviewed by me today   Still rash is crusty in nature  Friday /sat some of the pain has decreased  In his l ear    He takes warfarin for a fib  Also has h/o HTN and venous insuff Had zostavax 1/06 No shingrix vaccine  He takes ibuprofen for pain 400 bid  Per pt his health care team told him not to avoid it with warfarin   Patient Active Problem List   Diagnosis Date Noted  . Hematoma 03/06/2020  . Chronic diastolic heart failure ([place]) 10/25/2019  . Hypercalcemia 10/25/2019  . Venous stasis ulcer ([place]) 08/20/2019  . Chondrocalcinosis 09/05/2018  . Primary osteoarthritis of right knee 08/19/2018  . Aortic atherosclerosis ([place]) 12/28/2016  . Problems with swallowing and mastication   . Chronic venous insufficiency 04/12/2016  . Thrombocytopenia ([place]) 06/25/2015  . Obstructive sleep apnea 06/25/2015  . Enlarged prostate without lower  urinary tract symptoms ([place]) 10/14/2014  . Advanced directives, counseling/discussion 12/13/2013  . Uncomplicated asthma 0000000  . Encounter for therapeutic drug monitoring 01/30/2013  . Routine general medical examination at a health care facility 12/11/2012  . TIA (transient ischemic attack) 12/11/2012  . Ventricular tachycardia-pause dependent 10/16/2012  . Sinus bradycardia 09/07/2012  . Bradycardia 09/07/2012  . Edema 09/07/2012  . Localized edema 09/07/2012  . Mixed hyperlipidemia   . Abnormal EKG 09/26/2011  . Atrial flutter ([place]) 07/21/2010  . Encounter for anticoagulation discussion and counseling 07/21/2010  . Type 2 diabetes, controlled, with neuropathy ([place])   . Tachy-brady syndrome ([place])   . BPH with obstruction/lower urinary tract symptoms   . Unspecified asthma(493.90)   . GERD (gastroesophageal reflux disease)   . Essential hypertension    Past Medical History:  Diagnosis Date  . Asthma   . Atrial flutter ([place]) 2007  . Band keratopathy   . Benign prostatic hypertrophy   . Chronic ear infection   . Chronic kidney disease   . Chronic prostatitis   . Dental bridge present    permanent - upper  . Diabetes mellitus   . Elbow stiffness, left    s/p fracture many yrs ago.  arm does not straighten  . GERD (gastroesophageal reflux disease)    laryngeal involvement  . Hyperlipidemia   . Hypertension   . Obstructive sleep apnea  CPAP-9  . Osteoarthrosis, localized, primary, knee    post-traumatic  . Prostatitis   . Stroke [facility])    TIA  . Tachy-brady syndrome ([place])   . UTI (urinary tract infection)    Past Surgical History:  Procedure Laterality Date  . APPENDECTOMY    . BELPHAROPTOSIS REPAIR     Dr [name]---didn't resolve weepy eye and eyelid drooping  . CARDIOVERSION  04/13/2010  . CATARACT EXTRACTION, BILATERAL  2009  . Chest pain  8/12   Stress test benign  . ESOPHAGEAL DILATION  05/26/2016   Procedure: ESOPHAGEAL DILATION;  Surgeon: Lucilla Lame, MD;  Location: [facility];  Service: Endoscopy;;  . ESOPHAGOGASTRODUODENOSCOPY (EGD) WITH PROPOFOL N/A 05/26/2016   Procedure: ESOPHAGOGASTRODUODENOSCOPY (EGD) WITH PROPOFOL;  Surgeon: Lucilla Lame, MD;  Location: [facility];  Service: Endoscopy;  Laterality: N/A;  Diabetic - oral meds sleep apnea  . EYE SURGERY    . FRACTURE SURGERY Left    elbow  . KNEE SURGERY  1998   plate after fracture, then removed for infection  . left elbow surgery    . MASTOIDECTOMY  8/08   Dr Idelle Crouch  . RHINOPLASTY  5/10   and septoplasty  . SUBACROMIAL DECOMPRESSION Right 2005   Arthroscopic (for rotator cuff and biceps tendon ruptures)  . TEAR DUCT PROBING  11/13   Dr Vickki Muff  . TOTAL KNEE ARTHROPLASTY Left 09/01/2014   Procedure: TOTAL KNEE ARTHROPLASTY;  Surgeon: Dereck Leep, MD;  Location: [facility];  Service: Orthopedics;  Laterality: Left;  . TRIGGER FINGER RELEASE Left 02/25/2015   Procedure: LEFT LONG TRIGGER RELEASE;  Surgeon: Dereck Leep, MD;  Location: [facility];  Service: Orthopedics;  Laterality: Left;  Marland Kitchen VENOUS ABLATION     Social History   Tobacco Use  . Smoking status: Former Smoker    Packs/day: 1.00    Years: 0.00    Pack years: 0.00    Types: Cigarettes    Quit date: 01/03/1969    Years since quitting: 51.4  . Smokeless tobacco: Never Used  Substance Use Topics  . Alcohol use: No    Alcohol/week: 1.0 standard drink    Types: 1 Glasses of wine per week    Comment: rare wine. 1-2x/yr.  . Drug use: No   Family History  Problem Relation Age of Onset  . Colon cancer Father   . Diabetes Father   . Hypertension Father   . Other Mother        natural causes  . Heart attack Neg Hx   . Stroke Neg Hx    Allergies  Allergen Reactions  . Enoxaparin Hives  . Enoxaparin Sodium Hives  . Latex Rash    Has trouble with BANDAIDS that have been left on for more than 24 hours. Prefers paper tape.  . Nickel Rash  . Percocet [Oxycodone-Acetaminophen] Rash   . Tape Rash and Other (See Comments)    Sensitivity    Current Outpatient Medications on File Prior to Visit  Medication Sig Dispense Refill  . APPLE CIDER VINEGAR PO Take by mouth.    Marland Kitchen atorvastatin (LIPITOR) 80 MG tablet TAKE 1 TABLET BY MOUTH  DAILY 90 tablet 3  . cephALEXin (KEFLEX) 500 MG capsule Take 1 capsule (500 mg total) by mouth 4 (four) times daily. 28 capsule 0  . cetirizine (ZYRTEC) 10 MG tablet Take 10 mg by mouth daily.    . Cyanocobalamin (B-12) 2500 MCG SUBL Place under the tongue.    Marland Kitchen  docusate sodium (COLACE) 100 MG capsule Take 100 mg by mouth 2 (two) times daily.    . famotidine (PEPCID) 20 MG tablet Take 20 mg by mouth daily.    . finasteride (PROSCAR) 5 MG tablet TAKE 1 TABLET BY MOUTH ONCE DAILY 90 tablet 3  . furosemide (LASIX) 20 MG tablet Take 1 tablet (20 mg total) by mouth daily as needed (Extra pill as needed for weight 215 or higher). Once daily and extra pill as needed for weight 215 or higher. 180 tablet 0  . glucose blood (BAYER CONTOUR TEST) test strip USE STRIP TO TEST BLOOD SUGAR ONCE DAILY. Dx Code: E11.40 100 each 3  . ibuprofen (ADVIL,MOTRIN) 200 MG tablet Take 400 mg by mouth 2 (two) times daily.     Marland Kitchen loratadine (CLARITIN) 10 MG tablet Take 10 mg by mouth daily.    . metFORMIN (GLUCOPHAGE) 500 MG tablet TAKE 1 TABLET BY MOUTH  TWICE DAILY WITH MEALS 180 tablet 3  . Microlet Lancets MISC Use to test twice daily or as directed 200 each 3  . mupirocin ointment (BACTROBAN) 2 % Apply 1 application topically 2 (two) times daily. 22 g 0  . tamsulosin (FLOMAX) 0.4 MG CAPS capsule TAKE 1 CAPSULE BY MOUTH  DAILY 90 capsule 3  . triamterene-hydrochlorothiazide (DYAZIDE) 37.5-25 MG capsule TAKE 1 CAPSULE BY MOUTH IN  THE MORNING 90 capsule 3  . valACYclovir (VALTREX) 1000 MG tablet Take 1 tablet (1,000 mg total) by mouth 3 (three) times daily. 21 tablet 0  . warfarin (COUMADIN) 5 MG tablet TAKE 1 TO 1 & 1/2 (ONE & ONE-HALF) TABLETS BY MOUTH ONCE DAILY AS  DIRECTED 10 tablet 1   No current facility-administered medications on file prior to visit.    Review of Systems  Constitutional: Negative for activity change, appetite change, fatigue, fever and unexpected weight change.  HENT: Negative for congestion, rhinorrhea, sore throat and trouble swallowing.   Eyes: Negative for pain, redness, itching and visual disturbance.  Respiratory: Negative for cough, chest tightness, shortness of breath and wheezing.   Cardiovascular: Negative for chest pain and palpitations.  Gastrointestinal: Negative for abdominal pain, blood in stool, constipation, diarrhea and nausea.  Endocrine: Negative for cold intolerance, heat intolerance, polydipsia and polyuria.  Genitourinary: Negative for difficulty urinating, dysuria, frequency and urgency.  Musculoskeletal: Negative for arthralgias, joint swelling and myalgias.  Skin: Positive for rash. Negative for pallor.  Neurological: Negative for dizziness, tremors, facial asymmetry, weakness, light-headedness, numbness and headaches.  Hematological: Negative for adenopathy. Does not bruise/bleed easily.  Psychiatric/Behavioral: Negative for decreased concentration and dysphoric mood. The patient is not nervous/anxious.        Objective:   Physical Exam Constitutional:      Appearance: Normal appearance. He is not ill-appearing.     Comments: Overweight   HENT:     Right Ear: Tympanic membrane and ear canal normal.     Left Ear: Tympanic membrane and ear canal normal.     Ears:     Comments: Zoster rash on L pinna and close to ear canal  Mild redness and swelling     Mouth/Throat:     Mouth: Mucous membranes are moist.     Pharynx: Oropharynx is clear. No oropharyngeal exudate or posterior oropharyngeal erythema.  Eyes:     General: No scleral icterus.       Right eye: No discharge.        Left eye: No discharge.     Conjunctiva/sclera: Conjunctivae normal.  Pupils: Pupils are equal, round, and reactive  to light.  Cardiovascular:     Rate and Rhythm: Bradycardia present.     Heart sounds: Murmur heard.    Pulmonary:     Effort: Pulmonary effort is normal. No respiratory distress.     Breath sounds: No wheezing.  Musculoskeletal:     Cervical back: Normal range of motion and neck supple. No tenderness.  Lymphadenopathy:     Cervical: No cervical adenopathy.  Skin:    General: Skin is warm and dry.     Comments: Rash - L face and neck  C2 and V3 dermatomes Crusty healing vesicles/no pustules  Some mild redness of pinna and mild swelling   Neurological:     Mental Status: He is alert.  Psychiatric:        Mood and Affect: Mood normal.           Assessment & Plan:   Problem List Items Addressed This Visit      Other   Shingles    L side in V3 and C2 distribution  No ocular involvement and no dizziness  Seen in ER on 3/6 Reviewed hospital records, lab results and studies in detail  Px course of valcyclovir and also keflex (for possible 2ndary bacterial infection) Overall making progress and pain is improved Rash is drying up with no excessive redness or swelling  He takes ibuprofen for pain (also on warfarin and claims his pcp is aware)  Has had zostavax in hte past but not shingrix  Disc rash care and s/s of infection  Update if not starting to improve in a week or if worsening

## 2020-05-26 DIAGNOSIS — M25522 Pain in left elbow: Secondary | ICD-10-CM | POA: Diagnosis not present

## 2020-05-26 DIAGNOSIS — S40022D Contusion of left upper arm, subsequent encounter: Secondary | ICD-10-CM | POA: Diagnosis not present

## 2020-05-26 DIAGNOSIS — M25422 Effusion, left elbow: Secondary | ICD-10-CM | POA: Diagnosis not present

## 2020-05-26 DIAGNOSIS — S46312D Strain of muscle, fascia and tendon of triceps, left arm, subsequent encounter: Secondary | ICD-10-CM | POA: Diagnosis not present

## 2020-05-26 DIAGNOSIS — M6281 Muscle weakness (generalized): Secondary | ICD-10-CM | POA: Diagnosis not present

## 2020-05-26 DIAGNOSIS — M7022 Olecranon bursitis, left elbow: Secondary | ICD-10-CM | POA: Diagnosis not present

## 2020-05-26 DIAGNOSIS — M19122 Post-traumatic osteoarthritis, left elbow: Secondary | ICD-10-CM | POA: Diagnosis not present

## 2020-05-28 DIAGNOSIS — M25422 Effusion, left elbow: Secondary | ICD-10-CM | POA: Diagnosis not present

## 2020-05-28 DIAGNOSIS — S46312D Strain of muscle, fascia and tendon of triceps, left arm, subsequent encounter: Secondary | ICD-10-CM | POA: Diagnosis not present

## 2020-05-28 DIAGNOSIS — M6281 Muscle weakness (generalized): Secondary | ICD-10-CM | POA: Diagnosis not present

## 2020-05-28 DIAGNOSIS — S40022D Contusion of left upper arm, subsequent encounter: Secondary | ICD-10-CM | POA: Diagnosis not present

## 2020-05-28 DIAGNOSIS — M25522 Pain in left elbow: Secondary | ICD-10-CM | POA: Diagnosis not present

## 2020-05-28 DIAGNOSIS — M7022 Olecranon bursitis, left elbow: Secondary | ICD-10-CM | POA: Diagnosis not present

## 2020-05-28 DIAGNOSIS — M19122 Post-traumatic osteoarthritis, left elbow: Secondary | ICD-10-CM | POA: Diagnosis not present

## 2020-06-03 ENCOUNTER — Other Ambulatory Visit: Payer: Self-pay

## 2020-06-03 ENCOUNTER — Ambulatory Visit (INDEPENDENT_AMBULATORY_CARE_PROVIDER_SITE_OTHER): Payer: Medicare Other

## 2020-06-03 DIAGNOSIS — I4892 Unspecified atrial flutter: Secondary | ICD-10-CM

## 2020-06-03 DIAGNOSIS — Z5181 Encounter for therapeutic drug level monitoring: Secondary | ICD-10-CM | POA: Diagnosis not present

## 2020-06-03 LAB — POCT INR: INR: 2.1 (ref 2.0–3.0)

## 2020-06-03 NOTE — Patient Instructions (Signed)
-   continue warfarin dosage of 1 tablets daily except 1.5 tablet on SUNDAYS, TUESDAYS & THURSDAYS.   - Recheck in 5 weeks.   Please be consistent with your green intake - please pick a day or days each week to have your greens and have them every week on that day (your pills are on a schedule, so your greens need to be on one)

## 2020-06-07 ENCOUNTER — Other Ambulatory Visit: Payer: Self-pay | Admitting: Cardiovascular Disease

## 2020-06-08 NOTE — Telephone Encounter (Signed)
Please review for refill, thanks ! 

## 2020-06-09 DIAGNOSIS — M25422 Effusion, left elbow: Secondary | ICD-10-CM | POA: Diagnosis not present

## 2020-06-09 DIAGNOSIS — S40022D Contusion of left upper arm, subsequent encounter: Secondary | ICD-10-CM | POA: Diagnosis not present

## 2020-06-09 DIAGNOSIS — M6281 Muscle weakness (generalized): Secondary | ICD-10-CM | POA: Diagnosis not present

## 2020-06-09 DIAGNOSIS — M7022 Olecranon bursitis, left elbow: Secondary | ICD-10-CM | POA: Diagnosis not present

## 2020-06-09 DIAGNOSIS — M19122 Post-traumatic osteoarthritis, left elbow: Secondary | ICD-10-CM | POA: Diagnosis not present

## 2020-06-09 DIAGNOSIS — S46312D Strain of muscle, fascia and tendon of triceps, left arm, subsequent encounter: Secondary | ICD-10-CM | POA: Diagnosis not present

## 2020-06-09 DIAGNOSIS — M25522 Pain in left elbow: Secondary | ICD-10-CM | POA: Diagnosis not present

## 2020-07-08 ENCOUNTER — Other Ambulatory Visit: Payer: Self-pay

## 2020-07-08 ENCOUNTER — Ambulatory Visit (INDEPENDENT_AMBULATORY_CARE_PROVIDER_SITE_OTHER): Payer: Medicare Other | Admitting: Pharmacist

## 2020-07-08 DIAGNOSIS — I4892 Unspecified atrial flutter: Secondary | ICD-10-CM | POA: Diagnosis not present

## 2020-07-08 DIAGNOSIS — Z5181 Encounter for therapeutic drug level monitoring: Secondary | ICD-10-CM | POA: Diagnosis not present

## 2020-07-08 LAB — POCT INR: INR: 2.9 (ref 2.0–3.0)

## 2020-07-08 NOTE — Patient Instructions (Signed)
Description   - continue warfarin dosage of 1 tablets daily except 1.5 tablet on SUNDAYS, TUESDAYS & THURSDAYS.   - Recheck in 5 weeks.   Please be consistent with your green intake - please pick a day or days each week to have your greens and have them every week on that day (your pills are on a schedule, so your greens need to be on one)

## 2020-07-27 ENCOUNTER — Telehealth: Payer: Self-pay | Admitting: Internal Medicine

## 2020-07-27 NOTE — Telephone Encounter (Signed)
Pt came into office to drop off handicap renewal form.Paced in provider box

## 2020-07-28 NOTE — Telephone Encounter (Signed)
error 

## 2020-07-28 NOTE — Telephone Encounter (Signed)
He also left a hand-written note with 2 other questions: "Shingles-This is my 13th week. Only a few remaining spots: Left ear and back of neck. How soon do you recommend getting the  latest dual shots? Covid Shot-I could not get the TLC flu shot because of shingles. How soon can I get the latest injection?"

## 2020-07-28 NOTE — Telephone Encounter (Signed)
I called pt to verify what vaccine he was meaning in the covid shot section. I am sure he made a mistake writing flu shot instead of Covid. He said that is correct. He meant Covid vaccine. Form placed in Dr Alla German inbox to sign when he returns.   Please give form to me when done. I will call him to pick up.

## 2020-08-03 NOTE — Telephone Encounter (Signed)
Do you mind answering his 2 questions about the Covid booster and when to get Shingrix. Thanks. I will call him back with everything once answered.

## 2020-08-03 NOTE — Telephone Encounter (Signed)
Spoke to pt. Form up front ready for pickup 

## 2020-08-05 DIAGNOSIS — M25642 Stiffness of left hand, not elsewhere classified: Secondary | ICD-10-CM | POA: Diagnosis not present

## 2020-08-05 DIAGNOSIS — M25542 Pain in joints of left hand: Secondary | ICD-10-CM | POA: Diagnosis not present

## 2020-08-05 DIAGNOSIS — S66812A Strain of other specified muscles, fascia and tendons at wrist and hand level, left hand, initial encounter: Secondary | ICD-10-CM | POA: Diagnosis not present

## 2020-08-05 DIAGNOSIS — M19132 Post-traumatic osteoarthritis, left wrist: Secondary | ICD-10-CM | POA: Diagnosis not present

## 2020-08-12 ENCOUNTER — Ambulatory Visit (INDEPENDENT_AMBULATORY_CARE_PROVIDER_SITE_OTHER): Payer: Medicare Other

## 2020-08-12 ENCOUNTER — Other Ambulatory Visit: Payer: Self-pay

## 2020-08-12 DIAGNOSIS — I4892 Unspecified atrial flutter: Secondary | ICD-10-CM | POA: Diagnosis not present

## 2020-08-12 DIAGNOSIS — Z5181 Encounter for therapeutic drug level monitoring: Secondary | ICD-10-CM | POA: Diagnosis not present

## 2020-08-12 LAB — POCT INR: INR: 2.5 (ref 2.0–3.0)

## 2020-08-12 NOTE — Patient Instructions (Signed)
-   continue warfarin dosage of 1 tablets daily except 1.5 tablet on SUNDAYS, TUESDAYS & THURSDAYS.   - Recheck in 6 weeks.   Please be consistent with your green intake - please pick a day or days each week to have your greens and have them every week on that day (your pills are on a schedule, so your greens need to be on one)

## 2020-08-14 ENCOUNTER — Ambulatory Visit: Payer: Medicare Other | Admitting: Occupational Therapy

## 2020-08-17 ENCOUNTER — Ambulatory Visit: Payer: Medicare Other | Attending: Sports Medicine | Admitting: Occupational Therapy

## 2020-08-17 DIAGNOSIS — M79602 Pain in left arm: Secondary | ICD-10-CM | POA: Insufficient documentation

## 2020-08-17 DIAGNOSIS — M25632 Stiffness of left wrist, not elsewhere classified: Secondary | ICD-10-CM | POA: Insufficient documentation

## 2020-08-17 DIAGNOSIS — R600 Localized edema: Secondary | ICD-10-CM | POA: Diagnosis not present

## 2020-08-17 DIAGNOSIS — M25642 Stiffness of left hand, not elsewhere classified: Secondary | ICD-10-CM | POA: Insufficient documentation

## 2020-08-17 DIAGNOSIS — M6281 Muscle weakness (generalized): Secondary | ICD-10-CM | POA: Insufficient documentation

## 2020-08-17 NOTE — Therapy (Signed)
[facility] [address], [place], [address] Phone: [phone]   Fax:  [phone]  Occupational Therapy Evaluation  Patient Details  Name: Michael Colon MRN: [id number] Date of Birth: 1930/03/31 Referring Provider (OT): Dr Candelaria Stagers   Encounter Date: 08/17/2020   OT End of Session - 08/17/20 1225     Visit Number 1    Number of Visits 6    Date for OT Re-Evaluation 09/28/20    OT Start Time 1030    OT Stop Time 1140    OT Time Calculation (min) 70 min    Activity Tolerance Patient tolerated treatment well    Behavior During Therapy [facility] for tasks assessed/performed             Past Medical History:  Diagnosis Date   Asthma    Atrial flutter ([place]) 2007   Band keratopathy    Benign prostatic hypertrophy    Chronic ear infection    Chronic kidney disease    Chronic prostatitis    Dental bridge present    permanent - upper   Diabetes mellitus    Elbow stiffness, left    s/p fracture many yrs ago.  arm does not straighten   GERD (gastroesophageal reflux disease)    laryngeal involvement   Hyperlipidemia    Hypertension    Obstructive sleep apnea    CPAP-9   Osteoarthrosis, localized, primary, knee    post-traumatic   Prostatitis    Stroke ([place])    TIA   Tachy-brady syndrome ([place])    UTI (urinary tract infection)     Past Surgical History:  Procedure Laterality Date   APPENDECTOMY     BELPHAROPTOSIS REPAIR     Dr [name]---didn't resolve weepy eye and eyelid drooping   CARDIOVERSION  04/13/2010   CATARACT EXTRACTION, BILATERAL  2009   Chest pain  8/12   Stress test benign   ESOPHAGEAL DILATION  05/26/2016   Procedure: ESOPHAGEAL DILATION;  Surgeon: Lucilla Lame, MD;  Location: [place];  Service: Endoscopy;;   ESOPHAGOGASTRODUODENOSCOPY (EGD) WITH PROPOFOL N/A 05/26/2016   Procedure: ESOPHAGOGASTRODUODENOSCOPY (EGD) WITH PROPOFOL;  Surgeon: Lucilla Lame, MD;  Location:  [place];  Service: Endoscopy;  Laterality: N/A;  Diabetic - oral meds sleep apnea   EYE SURGERY     FRACTURE SURGERY Left    elbow   KNEE SURGERY  1998   plate after fracture, then removed for infection   left elbow surgery     MASTOIDECTOMY  8/08   Dr Idelle Crouch   RHINOPLASTY  5/10   and septoplasty   SUBACROMIAL DECOMPRESSION Right 2005   Arthroscopic (for rotator cuff and biceps tendon ruptures)   TEAR DUCT PROBING  11/13   Dr Vickki Muff   TOTAL KNEE ARTHROPLASTY Left 09/01/2014   Procedure: TOTAL KNEE ARTHROPLASTY;  Surgeon: Dereck Leep, MD;  Location: [facility];  Service: Orthopedics;  Laterality: Left;   TRIGGER FINGER RELEASE Left 02/25/2015   Procedure: LEFT LONG TRIGGER RELEASE;  Surgeon: Dereck Leep, MD;  Location: [facility];  Service: Orthopedics;  Laterality: Left;   VENOUS ABLATION      There were no vitals filed for this visit.   Subjective Assessment - 08/17/20 1214     Subjective  I brought you pictures of my L arm from the past - had years ago sever L elbow injury and plates/screws- and then March this year had hematoma in my whole arm after maybe  pulling something and hurting my tricep- I did okay - had PT at [facility] - but now still pain from pinkie to elbow on top of arm -and cannot straighten my pinkie all the way - weak grip    Pertinent History Seen DR Candelaria Stagers 08/05/20  - prior to this date in April pt was seen for L arm hematoma iwth possible tricep injury  that happend March 22- had PT at [facility] - and now on 08/05/20 for L 5th digit extention issue - Dr Candelaria Stagers  discussed the nature of his current subjective complaints, clinical examination, test results and have reviewed treatment options. The plan is to do the following;    - The patient has acute loss of fifth digit extension. I explained that he could have caused a tendon rupture from his underlying DRUJ arthritis. I am less suspicious for sagittal band abnormality however this could also cause  his symptoms. No obvious findings for trigger finger.  - He has a relatively benign exam otherwise. There is some focal swelling over his DRUJ but this could be chronic from his severe arthritis change. I do not palpate any obvious retracted tendon throughout the dorsal finger and hand. X-rays show his chronic changes. I discussed the possible causes of his symptoms and that he may need surgical consultation, however, he may still end up with extensor lag of the finger or issues with another finger from tendon transfer. I discussed his case with one of my partners who essentially thought similarly. I referred him to occupational therapy but if he has persistent concerns about his finger, then we can perform diagnostic ultrasound and he can have a consultation with an upper extremity specialist.  - Daily activity as tolerated. Modify activity as needed according to symptoms. No limitations to weight bearing.   - Refer to OT for splinting, home exercise. Continue home exercise program to maintain strength, flexibility, and endurance.  - Use Tylenol, topical diclofenac/pain cream, relative rest, elevation, massage, and ice/heat as needed for pain.  He avoids NSAIDs due to being on warfarin.  - Follow up as needed depending on functional limitations and response to occupational therapy.    Patient Stated Goals I want to be able to ue my pinkie better and straighten it - as well as pain better on top of my hand , wrist to elbow - so I can type on computer , do my photography, do my digital art -    Currently in Pain? Yes    Pain Score 6     Pain Location Arm    Pain Orientation Left;Upper    Pain Descriptors / Indicators Tender;Aching;Nagging    Pain Type Acute pain;Chronic pain    Pain Onset More than a month ago    Pain Frequency Intermittent               [facility] OT Assessment - 08/17/20 0001       Assessment   Medical Diagnosis L 5th digit ext lost of motion , pain    Referring Provider (OT) Dr  Candelaria Stagers    Onset Date/Surgical Date 07/29/20    Hand Dominance Right    Prior Therapy 2017      Prior Function   Vocation Retired    Leisure Lives at [employer] , more than 4 hrs day on computer doing photography, studia at home, Estate agent /colouring on computer , some yard work      AROM   Right Wrist Extension 55  Degrees    Right Wrist Flexion 75 Degrees    Left Wrist Extension 70 Degrees    Left Wrist Flexion 70 Degrees      Left Hand AROM   L Index  MCP 0-90 85 Degrees    L Index PIP 0-100 90 Degrees    L Long  MCP 0-90 85 Degrees    L Long PIP 0-100 95 Degrees    L Ring  MCP 0-90 85 Degrees    L Ring PIP 0-100 100 Degrees    L Little  MCP 0-90 85 Degrees   -30   L Little PIP 0-100 90 Degrees                 Pt to do contrast to L hand to decrease edema and pain - tenderness  Fitted with wrist splint to decrease wrist pain and pain along L EDM  And MC block splint to use on computer if needed and night time-  Tendon glides - only MC and PIP flexion -with PROM into full extention  10 reps  With contrast 3 x day  Voltaren ointment use  And enlarge grip around tools or use palm to lift or carry objects              OT Education - 08/17/20 1224     Education Details findings of eval and HEP    Person(s) Educated Patient    Methods Explanation;Demonstration;Tactile cues;Verbal cues;Handout    Comprehension Returned demonstration;Verbalized understanding              OT Short Term Goals - 08/17/20 1238       OT SHORT TERM GOAL #1   Title Pain and tenderness decrease in L hand , wrist to elbow less than 2/10 at the worse    Baseline Pain increase to 5-6/10 over dorsal hand to elbow - worse on dorsal wrist - along path of EDM    Time 3    Period Weeks    Status New    Target Date 09/07/20               OT Long Term Goals - 08/17/20 1240       OT LONG TERM GOAL #1   Title Pt to be independent in use of splinting to increase  extention of L 5th digit extention and decrease pain    Baseline pain 5-6/10 over dorsal hand to elbow - extention lag -30 at 5th [facility]    Time 3    Period Weeks    Status New    Target Date 09/07/20      OT LONG TERM GOAL #2   Title AROM in L hand improve to [facility] to do his typing and digital coloring on computer without issues    Baseline cannot use L 5th digit on computer - -30 at 5th [facility] extention    Time 6    Period Weeks    Status New    Target Date 09/28/20                   Plan - 08/17/20 1226     Clinical Impression Statement Pt present with decrease extention of L 5th MC more than PIP, increase pain and tenderness along EDM with worse tenderness over dorsal wrist. Flexion of 5th MC 85, and PIP 90 - extention lag at 5th MC -30. Pt report having earlier this year bad hematoma in L arm from possible tricep injury -  and develop some pain over dorsal elbow to hand. Tenderness of 4-5/10 is along EDM - wiht some edema at wrist - pt do have some DRUJ arthritis - ? possilbe rupture or strain and irritation of EDM - Pt also has L 4th digit trigger finger - pt show decrease AROM in L 5th digit, wrist , increase pain and edema - pt can benefit from skilled OT services to improve use of L hand and arm in ADL's and IADl's    OT Occupational Profile and History Problem Focused Assessment - Including review of records relating to presenting problem    Occupational performance deficits (Please refer to evaluation for details): ADL's;IADL's;Rest and Sleep;Leisure;Play;Social Participation    Body Structure / Function / Physical Skills ADL;Flexibility;Edema;IADL;ROM;Pain;Strength;UE functional use    Rehab Potential Fair    Clinical Decision Making Limited treatment options, no task modification necessary    Comorbidities Affecting Occupational Performance: None    Modification or Assistance to Complete Evaluation  No modification of tasks or assist necessary to complete eval    OT Frequency 1x  / week    OT Duration 6 weeks    OT Treatment/Interventions Self-care/ADL training;Iontophoresis;Ultrasound;Contrast Bath;DME and/or AE instruction;Splinting;Therapeutic exercise;Manual Therapy;Patient/family education    Consulted and Agree with Plan of Care Patient             Patient will benefit from skilled therapeutic intervention in order to improve the following deficits and impairments:   Body Structure / Function / Physical Skills: ADL, Flexibility, Edema, IADL, ROM, Pain, Strength, UE functional use       Visit Diagnosis: Stiffness of left hand, not elsewhere classified - Plan: Ot plan of care cert/re-cert  Stiffness of left wrist, not elsewhere classified - Plan: Ot plan of care cert/re-cert  Pain in left arm - Plan: Ot plan of care cert/re-cert  Localized edema - Plan: Ot plan of care cert/re-cert  Muscle weakness (generalized) - Plan: Ot plan of care cert/re-cert    Problem List Patient Active Problem List   Diagnosis Date Noted   Shingles 05/18/2020   Hematoma 03/06/2020   Chronic diastolic heart failure ([place]) 10/25/2019   Hypercalcemia 10/25/2019   Venous stasis ulcer ([place]) 08/20/2019   Chondrocalcinosis 09/05/2018   Primary osteoarthritis of right knee 08/19/2018   Aortic atherosclerosis ([place]) 12/28/2016   Problems with swallowing and mastication    Chronic venous insufficiency 04/12/2016   Thrombocytopenia ([place]) 06/25/2015   Obstructive sleep apnea 06/25/2015   Enlarged prostate without lower urinary tract symptoms (l[place]) 10/14/2014   Advanced directives, counseling/discussion 0000000   Uncomplicated asthma 0000000   Encounter for therapeutic drug monitoring 01/30/2013   Routine general medical examination at a health care facility 12/11/2012   TIA (transient ischemic attack) 12/11/2012   Ventricular tachycardia-pause dependent 10/16/2012   Sinus bradycardia 09/07/2012   Bradycardia 09/07/2012   Edema 09/07/2012   Localized edema  09/07/2012   Mixed hyperlipidemia    Abnormal EKG 09/26/2011   Atrial flutter ([place]) 07/21/2010   Encounter for anticoagulation discussion and counseling 07/21/2010   Type 2 diabetes, controlled, with neuropathy ([place])    Tachy-brady syndrome ([place])    BPH with obstruction/lower urinary tract symptoms    Unspecified asthma(493.90)    GERD (gastroesophageal reflux disease)    Essential hypertension     Ajiah Mcglinn OTR/L,CLT 08/17/2020, 12:44 PM  [facility] [address], [place], [address] Phone: [phone]   Fax:  [phone]  Name: Michael Colon  MRN: [id number] Date of Birth: [birth year]

## 2020-08-21 ENCOUNTER — Ambulatory Visit: Payer: BLUE CROSS/BLUE SHIELD | Admitting: Internal Medicine

## 2020-08-24 ENCOUNTER — Encounter: Payer: Self-pay | Admitting: Internal Medicine

## 2020-08-24 ENCOUNTER — Other Ambulatory Visit: Payer: Self-pay

## 2020-08-24 ENCOUNTER — Ambulatory Visit (INDEPENDENT_AMBULATORY_CARE_PROVIDER_SITE_OTHER): Payer: Medicare Other | Admitting: Internal Medicine

## 2020-08-24 VITALS — BP 100/70 | HR 64 | Temp 97.9°F | Ht 70.0 in | Wt 205.0 lb

## 2020-08-24 DIAGNOSIS — I83002 Varicose veins of unspecified lower extremity with ulcer of calf: Secondary | ICD-10-CM

## 2020-08-24 DIAGNOSIS — I10 Essential (primary) hypertension: Secondary | ICD-10-CM | POA: Diagnosis not present

## 2020-08-24 DIAGNOSIS — E1140 Type 2 diabetes mellitus with diabetic neuropathy, unspecified: Secondary | ICD-10-CM

## 2020-08-24 DIAGNOSIS — L97201 Non-pressure chronic ulcer of unspecified calf limited to breakdown of skin: Secondary | ICD-10-CM

## 2020-08-24 DIAGNOSIS — I5032 Chronic diastolic (congestive) heart failure: Secondary | ICD-10-CM

## 2020-08-24 LAB — POCT GLYCOSYLATED HEMOGLOBIN (HGB A1C): Hemoglobin A1C: 7.6 % — AB (ref 4.0–5.6)

## 2020-08-24 MED ORDER — TRIAMTERENE-HCTZ 37.5-25 MG PO CAPS: 1.0000 | ORAL_CAPSULE | Freq: Every morning | ORAL | 3 refills | Status: DC

## 2020-08-24 MED ORDER — ATORVASTATIN CALCIUM 80 MG PO TABS: 80.0000 mg | ORAL_TABLET | Freq: Every day | ORAL | 3 refills | Status: AC

## 2020-08-24 NOTE — Assessment & Plan Note (Signed)
Compensated now Hasn't needed the furosemide of late

## 2020-08-24 NOTE — Progress Notes (Signed)
Subjective:    Patient ID: Michael Colon, male    DOB: 06-10-1930, 88 y.o.   MRN: [id number]  HPI Here for follow up of diabetes and other chronic health conditions This visit occurred during the SARS-CoV-2 public health emergency.  Safety protocols were in place, including screening questions prior to the visit, additional usage of staff PPE, and extensive cleaning of exam room while observing appropriate contact time as indicated for disinfecting solutions.   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     atorvastatin (LIPITOR) 80 MG tablet TAKE 1 TABLET BY MOUTH  DAILY 90 tablet 3   cetirizine (ZYRTEC) 10 MG tablet Take 10 mg by mouth daily.     Cyanocobalamin (B-12) 2500 MCG SUBL Place under the tongue.     docusate sodium (COLACE) 100 MG capsule Take 100 mg by mouth 2 (two) times daily.     famotidine (PEPCID) 20 MG tablet Take 20 mg by mouth daily.     finasteride (PROSCAR) 5 MG tablet TAKE 1 TABLET BY MOUTH ONCE DAILY 90 tablet 3   glucose blood (BAYER CONTOUR TEST) test strip USE STRIP TO TEST BLOOD SUGAR ONCE DAILY. Dx Code: E11.40 100 each 3   ibuprofen (ADVIL,MOTRIN) 200 MG tablet Take 400 mg by mouth 2 (two) times daily.      metFORMIN (GLUCOPHAGE) 500 MG tablet TAKE 1 TABLET BY MOUTH  TWICE DAILY WITH MEALS  180 tablet 3   Microlet Lancets MISC Use to test twice daily or as directed 200 each 3   tamsulosin (FLOMAX) 0.4 MG CAPS capsule TAKE 1 CAPSULE BY MOUTH  DAILY 90 capsule 3   triamterene-hydrochlorothiazide (DYAZIDE) 37.5-25 MG capsule TAKE 1 CAPSULE BY MOUTH IN  THE MORNING 90 capsule 3   warfarin (COUMADIN) 5 MG tablet TAKE 1 TO 1 AND 1/2 TABLETS BY MOUTH DAILY AS DIRECTED  BY COUMADIN CLINIC 135 tablet 0   furosemide (LASIX) 20 MG tablet Take 1 tablet (20 mg total) by mouth daily as needed (Extra pill as needed for weight 215 or higher). Once daily and extra pill as needed for weight 215 or higher. 180 tablet 0   loratadine (CLARITIN) 10 MG tablet Take 10 mg by mouth daily. (Patient not taking: Reported on 08/24/2020)     No current facility-administered medications on file prior to visit.    Allergies  Allergen Reactions   Enoxaparin Hives   Enoxaparin Sodium Hives   Latex Rash    Has trouble with BANDAIDS that have been left on for more than 24 hours. Prefers paper tape.   Nickel Rash   Percocet [Oxycodone-Acetaminophen] Rash   Tape Rash and Other (See Comments)    Sensitivity     Past Medical History:  Diagnosis Date   Asthma    Atrial flutter ([place]) 2007   Band  keratopathy    Benign prostatic hypertrophy    Chronic ear infection    Chronic kidney disease    Chronic prostatitis    Dental bridge present    permanent - upper   Diabetes mellitus    Elbow stiffness, left    s/p fracture many yrs ago.  arm does not straighten   GERD (gastroesophageal reflux disease)    laryngeal involvement   Hyperlipidemia    Hypertension    Obstructive sleep apnea    CPAP-9   Osteoarthrosis, localized, primary, knee    post-traumatic   Prostatitis    Stroke ([place])    TIA   Tachy-brady syndrome ([place])    UTI (urinary tract infection)     Past Surgical History:  Procedure Laterality Date   APPENDECTOMY     BELPHAROPTOSIS REPAIR     Dr [name]---didn't resolve weepy eye and eyelid  drooping   CARDIOVERSION  04/13/2010   CATARACT EXTRACTION, BILATERAL  2009   Chest pain  8/12   Stress test benign   ESOPHAGEAL DILATION  05/26/2016   Procedure: ESOPHAGEAL DILATION;  Surgeon: Lucilla Lame, MD;  Location: [place];  Service: Endoscopy;;   ESOPHAGOGASTRODUODENOSCOPY (EGD) WITH PROPOFOL N/A 05/26/2016   Procedure: ESOPHAGOGASTRODUODENOSCOPY (EGD) WITH PROPOFOL;  Surgeon: Lucilla Lame, MD;  Location: [place];  Service: Endoscopy;  Laterality: N/A;  Diabetic - oral meds sleep apnea   EYE SURGERY     FRACTURE SURGERY Left    elbow   KNEE SURGERY  1998   plate after fracture, then removed for infection   left elbow surgery     MASTOIDECTOMY  8/08   Dr Idelle Crouch   RHINOPLASTY  5/10   and septoplasty   SUBACROMIAL DECOMPRESSION Right 2005   Arthroscopic (for rotator cuff and biceps tendon ruptures)   TEAR DUCT PROBING  11/13   Dr Vickki Muff   TOTAL KNEE ARTHROPLASTY Left 09/01/2014   Procedure: TOTAL KNEE ARTHROPLASTY;  Surgeon: Dereck Leep, MD;  Location: [facility];  Service: Orthopedics;  Laterality: Left;   TRIGGER FINGER RELEASE Left 02/25/2015   Procedure: LEFT LONG TRIGGER RELEASE;  Surgeon: Dereck Leep, MD;  Location: [facility];  Service: Orthopedics;  Laterality: Left;   VENOUS ABLATION      Family History  Problem Relation Age of Onset   Colon cancer Father    Diabetes Father    Hypertension Father    Other Mother        natural causes   Heart attack Neg Hx    Stroke Neg Hx     Social History   Socioeconomic History   Marital status: Widowed    Spouse name: Not on file   Number of children: 2   Years of education: Not on file   Highest education level: Not on file  Occupational History   Occupation: Theme park manager    Comment: Retired--[employer]  Tobacco Use   Smoking status: Former    Packs/day: 1.00    Years: 0.00    Pack years: 0.00    Types: Cigarettes    Quit date: 01/03/1969    Years since quitting: 51.6   Smokeless tobacco:  Never  Substance and Sexual Activity   Alcohol use: No    Alcohol/week: 1.0 standard drink    Types: 1 Glasses of wine per week    Comment: rare wine. 1-2x/yr.   Drug use: No   Sexual activity: Not on file  Other Topics Concern   Not on file  Social  History Narrative   Has living will   Health care POA-- son Shanon Brow   Has DNR order from the past---form redone 06/25/10   Probably no feeding tube if cognitively unaware   Social Determinants of Health   Financial Resource Strain: Not on file  Food Insecurity: Not on file  Transportation Needs: Not on file  Physical Activity: Not on file  Stress: Not on file  Social Connections: Not on file  Intimate Partner Violence: Not on file   Review of Systems Weight down 30# or so --in past 18 months Wakes up every 2-3 hours--not necessarily to void. Is able to get back to sleep Nocturia x 1 still 1 small ulcer on left calf    Objective:   Physical Exam Constitutional:      Appearance: Normal appearance.  Cardiovascular:     Rate and Rhythm: Normal rate and regular rhythm.     Heart sounds: No murmur heard.   No gallop.     Comments: 1+/1+ pedal pulses Pulmonary:     Effort: Pulmonary effort is normal.     Breath sounds: Normal breath sounds. No wheezing or rales.  Musculoskeletal:     Cervical back: Neck supple.     Comments: Trace ankle edema  Lymphadenopathy:     Cervical: No cervical adenopathy.  Skin:    Comments: Small granulated ulcer on left anterior calf  Neurological:     Mental Status: He is alert.  Psychiatric:        Mood and Affect: Mood normal.        Behavior: Behavior normal.           Assessment & Plan:

## 2020-08-24 NOTE — Assessment & Plan Note (Signed)
Lab Results  Component Value Date   HGBA1C 7.6 (A) 08/24/2020   Control has slipped but still acceptable on just metformin--especially for his age

## 2020-08-24 NOTE — Assessment & Plan Note (Signed)
Small granulated ulcer on left Wears support socks

## 2020-08-24 NOTE — Assessment & Plan Note (Signed)
BP Readings from Last 3 Encounters:  08/24/20 100/70  05/18/20 110/70  05/15/20 132/66   Good control on trimaterene/HCTZ

## 2020-08-25 ENCOUNTER — Ambulatory Visit: Payer: Medicare Other | Admitting: Occupational Therapy

## 2020-08-25 DIAGNOSIS — R600 Localized edema: Secondary | ICD-10-CM | POA: Diagnosis not present

## 2020-08-25 DIAGNOSIS — M79602 Pain in left arm: Secondary | ICD-10-CM | POA: Diagnosis not present

## 2020-08-25 DIAGNOSIS — M25642 Stiffness of left hand, not elsewhere classified: Secondary | ICD-10-CM

## 2020-08-25 DIAGNOSIS — M6281 Muscle weakness (generalized): Secondary | ICD-10-CM

## 2020-08-25 DIAGNOSIS — M25632 Stiffness of left wrist, not elsewhere classified: Secondary | ICD-10-CM | POA: Diagnosis not present

## 2020-08-25 NOTE — Therapy (Signed)
[facility] [address], [place], [address] Phone: [phone]   Fax:  [phone]  Occupational Therapy Treatment  Patient Details  Name: Michael Colon MRN: [id number] Date of Birth: 11-Dec-1930 Referring Provider (OT): Dr Candelaria Stagers   Encounter Date: 08/25/2020   OT End of Session - 08/25/20 1205     Visit Number 2    Number of Visits 6    Date for OT Re-Evaluation 09/28/20    OT Start Time 1031    OT Stop Time 1117    OT Time Calculation (min) 46 min    Activity Tolerance Patient tolerated treatment well    Behavior During Therapy [facility] for tasks assessed/performed             Past Medical History:  Diagnosis Date   Asthma    Atrial flutter ([place]) 2007   Band keratopathy    Benign prostatic hypertrophy    Chronic ear infection    Chronic kidney disease    Chronic prostatitis    Dental bridge present    permanent - upper   Diabetes mellitus    Elbow stiffness, left    s/p fracture many yrs ago.  arm does not straighten   GERD (gastroesophageal reflux disease)    laryngeal involvement   Hyperlipidemia    Hypertension    Obstructive sleep apnea    CPAP-9   Osteoarthrosis, localized, primary, knee    post-traumatic   Prostatitis    Stroke ([place])    TIA   Tachy-brady syndrome ([place])    UTI (urinary tract infection)     Past Surgical History:  Procedure Laterality Date   APPENDECTOMY     BELPHAROPTOSIS REPAIR     Dr [name]---didn't resolve weepy eye and eyelid drooping   CARDIOVERSION  04/13/2010   CATARACT EXTRACTION, BILATERAL  2009   Chest pain  8/12   Stress test benign   ESOPHAGEAL DILATION  05/26/2016   Procedure: ESOPHAGEAL DILATION;  Surgeon: Lucilla Lame, MD;  Location: [facility];  Service: Endoscopy;;   ESOPHAGOGASTRODUODENOSCOPY (EGD) WITH PROPOFOL N/A 05/26/2016   Procedure: ESOPHAGOGASTRODUODENOSCOPY (EGD) WITH PROPOFOL;  Surgeon: Lucilla Lame, MD;  Location:  [facility];  Service: Endoscopy;  Laterality: N/A;  Diabetic - oral meds sleep apnea   EYE SURGERY     FRACTURE SURGERY Left    elbow   KNEE SURGERY  1998   plate after fracture, then removed for infection   left elbow surgery     MASTOIDECTOMY  8/08   Dr Idelle Crouch   RHINOPLASTY  5/10   and septoplasty   SUBACROMIAL DECOMPRESSION Right 2005   Arthroscopic (for rotator cuff and biceps tendon ruptures)   TEAR DUCT PROBING  11/13   Dr Vickki Muff   TOTAL KNEE ARTHROPLASTY Left 09/01/2014   Procedure: TOTAL KNEE ARTHROPLASTY;  Surgeon: Dereck Leep, MD;  Location: [facility];  Service: Orthopedics;  Laterality: Left;   TRIGGER FINGER RELEASE Left 02/25/2015   Procedure: LEFT LONG TRIGGER RELEASE;  Surgeon: Dereck Leep, MD;  Location: [facility];  Service: Orthopedics;  Laterality: Left;   VENOUS ABLATION      There were no vitals filed for this visit.   Subjective Assessment - 08/25/20 1204     Subjective  Pain on top of hand , wrist , and forearm better- now about 2/10 - and using the splint but my pinkie still dropping down    Pertinent History Seen DR Candelaria Stagers 08/05/20  -  prior to this date in April pt was seen for L arm hematoma iwth possible tricep injury  that happend March 22- had PT at [facility] - and now on 08/05/20 for L 5th digit extention issue - Dr Candelaria Stagers  discussed the nature of his current subjective complaints, clinical examination, test results and have reviewed treatment options. The plan is to do the following;    - The patient has acute loss of fifth digit extension. I explained that he could have caused a tendon rupture from his underlying DRUJ arthritis. I am less suspicious for sagittal band abnormality however this could also cause his symptoms. No obvious findings for trigger finger.  - He has a relatively benign exam otherwise. There is some focal swelling over his DRUJ but this could be chronic from his severe arthritis change. I do not palpate any obvious  retracted tendon throughout the dorsal finger and hand. X-rays show his chronic changes. I discussed the possible causes of his symptoms and that he may need surgical consultation, however, he may still end up with extensor lag of the finger or issues with another finger from tendon transfer. I discussed his case with one of my partners who essentially thought similarly. I referred him to occupational therapy but if he has persistent concerns about his finger, then we can perform diagnostic ultrasound and he can have a consultation with an upper extremity specialist.  - Daily activity as tolerated. Modify activity as needed according to symptoms. No limitations to weight bearing.   - Refer to OT for splinting, home exercise. Continue home exercise program to maintain strength, flexibility, and endurance.  - Use Tylenol, topical diclofenac/pain cream, relative rest, elevation, massage, and ice/heat as needed for pain.  He avoids NSAIDs due to being on warfarin.  - Follow up as needed depending on functional limitations and response to occupational therapy.    Patient Stated Goals I want to be able to ue my pinkie better and straighten it - as well as pain better on top of my hand , wrist to elbow - so I can type on computer , do my photography, do my digital art -    Currently in Pain? Yes    Pain Score 2     Pain Location Arm    Pain Orientation Left   forearm   Pain Descriptors / Indicators Tender    Pain Onset More than a month ago    Pain Frequency Intermittent                [facility] OT Assessment - 08/25/20 0001       AROM   Right Wrist Extension 65 Degrees    Right Wrist Flexion 75 Degrees    Left Wrist Extension 70 Degrees    Left Wrist Flexion 70 Degrees      Left Hand AROM   L Little  MCP 0-90 --   -35             Pt show decrease tenderness and pain over dorsal hand and forearm - over EDM of 5th- - decrease to 2/10  Increase wrist extention - but 5th MC still -35          OT Treatments/Exercises (OP) - 08/25/20 0001       LUE Contrast Bath   Time 8 minutes    Comments forearm prior to soft tissue and ROM              Pt to cont with  contrast to L hand /forearm to decrease edema and pain - tenderness  Done in clinic Soft tissue mobs done to volar hand , wrist and forearm- sweeping of Graston tool nr 2 - and pt to do same to decrease tightness in flexors to increase wrist and digits MC extention - pt do have thickening of fascia in palm on 4th and 5th- ? Dupuytrents  Fabricated custom hand base extention splint for 4th and 5th - to use at night time  And cont  with wrist splint to decrease wrist pain and pain along L EDM during day  Rolling over red foam roller digits extention - 20 reps to increase extention of 5th and add composite digits and wrist extention stretch 12 reps prior to  Tendon glides - only MC and PIP flexion -with PROM into full extention  10 reps  Cont with  contrast 3 x day  Voltaren ointment use  And enlarge grip around tools or use palm to lift or carry objects       OT Education - 08/25/20 1205     Education Details progress and HEP    Person(s) Educated Patient    Methods Explanation;Demonstration;Tactile cues;Verbal cues;Handout    Comprehension Returned demonstration;Verbalized understanding              OT Short Term Goals - 08/17/20 1238       OT SHORT TERM GOAL #1   Title Pain and tenderness decrease in L hand , wrist to elbow less than 2/10 at the worse    Baseline Pain increase to 5-6/10 over dorsal hand to elbow - worse on dorsal wrist - along path of EDM    Time 3    Period Weeks    Status New    Target Date 09/07/20               OT Long Term Goals - 08/17/20 1240       OT LONG TERM GOAL #1   Title Pt to be independent in use of splinting to increase extention of L 5th digit extention and decrease pain    Baseline pain 5-6/10 over dorsal hand to elbow - extention lag -30  at 5th [facility]    Time 3    Period Weeks    Status New    Target Date 09/07/20      OT LONG TERM GOAL #2   Title AROM in L hand improve to [facility] to do his typing and digital coloring on computer without issues    Baseline cannot use L 5th digit on computer - -30 at 5th [facility] extention    Time 6    Period Weeks    Status New    Target Date 09/28/20                   Plan - 08/25/20 1206     Clinical Impression Statement Pt present at eval last week with decrease extention of L 5th MC more than PIP, increase pain and tenderness along EDM with worse tenderness over dorsal wrist. Flexion of 5th MC 85, and PIP 90 - extention lag at 5th MC -30. Pt report having earlier this year bad hematoma in L arm from possible tricep injury - and develop some pain over dorsal elbow to hand. Tenderness of 4-5/10 is along EDM - wiht some edema at wrist - pt do have some DRUJ arthritis - ? possilbe rupture or strain and irritation of EDM - Pt also has  L 4th digit trigger finger - Pt this date with decrease tenderness and pain over EDM to 2/10 and increase of 10 degrees in wrist ext - but extention lag at 5th MC -35 - MC block splint not working -switch for night time to custom hand base extention splint for 4thadn 5th to decrease extention lag -and soft tissue to volar hand and forearm to increase extention of wrist and 5th digit- cont to decrease pain over 5th EDM - pt cont to show decrease AROM in L 5th digit, wrist , increase pain and edema - pt can benefit from skilled OT services to improve use of L hand and arm in ADL's and IADl's    OT Occupational Profile and History Problem Focused Assessment - Including review of records relating to presenting problem    Occupational performance deficits (Please refer to evaluation for details): ADL's;IADL's;Rest and Sleep;Leisure;Play;Social Participation    Body Structure / Function / Physical Skills ADL;Flexibility;Edema;IADL;ROM;Pain;Strength;UE functional use    Rehab  Potential Fair    Clinical Decision Making Limited treatment options, no task modification necessary    Comorbidities Affecting Occupational Performance: None    Modification or Assistance to Complete Evaluation  No modification of tasks or assist necessary to complete eval    OT Frequency 1x / week    OT Duration 6 weeks    OT Treatment/Interventions Self-care/ADL training;Iontophoresis;Ultrasound;Contrast Bath;DME and/or AE instruction;Splinting;Therapeutic exercise;Manual Therapy;Patient/family education    Consulted and Agree with Plan of Care Patient             Patient will benefit from skilled therapeutic intervention in order to improve the following deficits and impairments:   Body Structure / Function / Physical Skills: ADL, Flexibility, Edema, IADL, ROM, Pain, Strength, UE functional use       Visit Diagnosis: Stiffness of left hand, not elsewhere classified  Stiffness of left wrist, not elsewhere classified  Pain in left arm  Localized edema  Muscle weakness (generalized)    Problem List Patient Active Problem List   Diagnosis Date Noted   Shingles 05/18/2020   Hematoma 03/06/2020   Chronic diastolic heart failure ([place]) 10/25/2019   Hypercalcemia 10/25/2019   Venous stasis ulcer ([place]) 08/20/2019   Chondrocalcinosis 09/05/2018   Primary osteoarthritis of right knee 08/19/2018   Aortic atherosclerosis ([place]) 12/28/2016   Problems with swallowing and mastication    Chronic venous insufficiency 04/12/2016   Thrombocytopenia ([place]) 06/25/2015   Obstructive sleep apnea 06/25/2015   Enlarged prostate without lower urinary tract symptoms ([place]) 10/14/2014   Advanced directives, counseling/discussion 0000000   Uncomplicated asthma 0000000   Encounter for therapeutic drug monitoring 01/30/2013   Routine general medical examination at a health care facility 12/11/2012   TIA (transient ischemic attack) 12/11/2012   Ventricular tachycardia-pause dependent  10/16/2012   Sinus bradycardia 09/07/2012   Bradycardia 09/07/2012   Edema 09/07/2012   Localized edema 09/07/2012   Mixed hyperlipidemia    Abnormal EKG 09/26/2011   Atrial flutter ([place]) 07/21/2010   Encounter for anticoagulation discussion and counseling 07/21/2010   Type 2 diabetes, controlled, with neuropathy ([place])    Tachy-brady syndrome ([place])    BPH with obstruction/lower urinary tract symptoms    Unspecified asthma(493.90)    GERD (gastroesophageal reflux disease)    Essential hypertension     Saraphina Lauderbaugh OTR/L,CLT 08/25/2020, 12:10 PM  [facility] [address] [place], [address] Phone: [phone]   Fax:  [phone]  Name: Michael Colon  MRN: [id number] Date of Birth: 1930/08/22

## 2020-08-27 ENCOUNTER — Ambulatory Visit: Payer: Medicare Other | Admitting: Occupational Therapy

## 2020-09-01 ENCOUNTER — Ambulatory Visit: Payer: Medicare Other | Admitting: Occupational Therapy

## 2020-09-01 DIAGNOSIS — M79602 Pain in left arm: Secondary | ICD-10-CM | POA: Diagnosis not present

## 2020-09-01 DIAGNOSIS — R600 Localized edema: Secondary | ICD-10-CM | POA: Diagnosis not present

## 2020-09-01 DIAGNOSIS — M25642 Stiffness of left hand, not elsewhere classified: Secondary | ICD-10-CM | POA: Diagnosis not present

## 2020-09-01 DIAGNOSIS — M6281 Muscle weakness (generalized): Secondary | ICD-10-CM

## 2020-09-01 DIAGNOSIS — M25632 Stiffness of left wrist, not elsewhere classified: Secondary | ICD-10-CM

## 2020-09-01 NOTE — Therapy (Signed)
[facility] [address], [place], [address] Phone: [phone]   Fax:  [phone]  Occupational Therapy Treatment  Patient Details  Name: Michael Colon MRN: [id number] Date of Birth: [birth year] Referring Provider (OT): Dr Candelaria Stagers   Encounter Date: 09/01/2020   OT End of Session - 09/01/20 1225     Visit Number 3    Number of Visits 6    Date for OT Re-Evaluation 09/28/20    OT Start Time 1025    OT Stop Time 1114    OT Time Calculation (min) 49 min    Activity Tolerance Patient tolerated treatment well    Behavior During Therapy [facility] for tasks assessed/performed             Past Medical History:  Diagnosis Date   Asthma    Atrial flutter ([place]) 2007   Band keratopathy    Benign prostatic hypertrophy    Chronic ear infection    Chronic kidney disease    Chronic prostatitis    Dental bridge present    permanent - upper   Diabetes mellitus    Elbow stiffness, left    s/p fracture many yrs ago.  arm does not straighten   GERD (gastroesophageal reflux disease)    laryngeal involvement   Hyperlipidemia    Hypertension    Obstructive sleep apnea    CPAP-9   Osteoarthrosis, localized, primary, knee    post-traumatic   Prostatitis    Stroke ([place])    TIA   Tachy-brady syndrome ([place])    UTI (urinary tract infection)     Past Surgical History:  Procedure Laterality Date   APPENDECTOMY     BELPHAROPTOSIS REPAIR     Dr [name]---didn't resolve weepy eye and eyelid drooping   CARDIOVERSION  04/13/2010   CATARACT EXTRACTION, BILATERAL  2009   Chest pain  8/12   Stress test benign   ESOPHAGEAL DILATION  05/26/2016   Procedure: ESOPHAGEAL DILATION;  Surgeon: Lucilla Lame, MD;  Location: [facility];  Service: Endoscopy;;   ESOPHAGOGASTRODUODENOSCOPY (EGD) WITH PROPOFOL N/A 05/26/2016   Procedure: ESOPHAGOGASTRODUODENOSCOPY (EGD) WITH PROPOFOL;  Surgeon: Lucilla Lame, MD;  Location:  [facility];  Service: Endoscopy;  Laterality: N/A;  Diabetic - oral meds sleep apnea   EYE SURGERY     FRACTURE SURGERY Left    elbow   KNEE SURGERY  1998   plate after fracture, then removed for infection   left elbow surgery     MASTOIDECTOMY  8/08   Dr Idelle Crouch   RHINOPLASTY  5/10   and septoplasty   SUBACROMIAL DECOMPRESSION Right 2005   Arthroscopic (for rotator cuff and biceps tendon ruptures)   TEAR DUCT PROBING  11/13   Dr Vickki Muff   TOTAL KNEE ARTHROPLASTY Left 09/01/2014   Procedure: TOTAL KNEE ARTHROPLASTY;  Surgeon: Dereck Leep, MD;  Location: [facility];  Service: Orthopedics;  Laterality: Left;   TRIGGER FINGER RELEASE Left 02/25/2015   Procedure: LEFT LONG TRIGGER RELEASE;  Surgeon: Dereck Leep, MD;  Location: [facility];  Service: Orthopedics;  Laterality: Left;   VENOUS ABLATION      There were no vitals filed for this visit.   Subjective Assessment - 09/01/20 1027     Subjective  Pain is better over the top of my hand , wrist to forear, - but splint no staying on at night    Pertinent History Seen DR Candelaria Stagers 08/05/20  - prior to  this date in April pt was seen for L arm hematoma iwth possible tricep injury  that happend March 22- had PT at [facility] - and now on 08/05/20 for L 5th digit extention issue - Dr Candelaria Stagers  discussed the nature of his current subjective complaints, clinical examination, test results and have reviewed treatment options. The plan is to do the following;    - The patient has acute loss of fifth digit extension. I explained that he could have caused a tendon rupture from his underlying DRUJ arthritis. I am less suspicious for sagittal band abnormality however this could also cause his symptoms. No obvious findings for trigger finger.  - He has a relatively benign exam otherwise. There is some focal swelling over his DRUJ but this could be chronic from his severe arthritis change. I do not palpate any obvious retracted tendon throughout the  dorsal finger and hand. X-rays show his chronic changes. I discussed the possible causes of his symptoms and that he may need surgical consultation, however, he may still end up with extensor lag of the finger or issues with another finger from tendon transfer. I discussed his case with one of my partners who essentially thought similarly. I referred him to occupational therapy but if he has persistent concerns about his finger, then we can perform diagnostic ultrasound and he can have a consultation with an upper extremity specialist.  - Daily activity as tolerated. Modify activity as needed according to symptoms. No limitations to weight bearing.   - Refer to OT for splinting, home exercise. Continue home exercise program to maintain strength, flexibility, and endurance.  - Use Tylenol, topical diclofenac/pain cream, relative rest, elevation, massage, and ice/heat as needed for pain.  He avoids NSAIDs due to being on warfarin.  - Follow up as needed depending on functional limitations and response to occupational therapy.    Patient Stated Goals I want to be able to ue my pinkie better and straighten it - as well as pain better on top of my hand , wrist to elbow - so I can type on computer , do my photography, do my digital art -    Currently in Pain? Yes    Pain Score 2     Pain Location Wrist    Pain Orientation Left   dorsal   Pain Descriptors / Indicators Aching;Tender    Pain Type Chronic pain    Pain Onset More than a month ago                [facility] OT Assessment - 09/01/20 0001       AROM   Right Wrist Extension 55 Degrees   after some PROM 70     Left Hand AROM   L Little  MCP 0-90 --   -30                     OT Treatments/Exercises (OP) - 09/01/20 0001       LUE Contrast Bath   Time 8 minutes    Comments prior to soft tissue and stretches to hand and wrist L               Pt to cont with  contrast to L hand /forearm to decrease edema and pain -  tenderness  Done in clinic Soft tissue mobs done to volar hand , wrist and forearm- sweeping of Graston tool nr 2 - and pt to do same to decrease tightness in flexors  to increase wrist and digits MC extention - pt do have thickening of fascia in palm on 4th and 5th- ? Dupuytrents   Modify this date custom hand base extention splint for 4th and 5th - to use at night time - previous palmar splint allowed some MC flexion   And cont  with wrist splint to decrease wrist pain (DRUJ) and pain along L EDM during day  Rolling over red foam roller digits extention - 20 reps to increase extention of 5th and cont  composite digits and wrist extention stretch 12 reps prior to  Tendon glides - only MC and PIP flexion -with PROM into full extention  10 reps  Cont with  contrast 3 x day  Voltaren ointment use  And enlarge grip around tools or use palm to lift or carry objects        OT Education - 09/01/20 1225     Education Details progress and HEP    Person(s) Educated Patient    Methods Explanation;Demonstration;Tactile cues;Verbal cues;Handout    Comprehension Returned demonstration;Verbalized understanding              OT Short Term Goals - 08/17/20 1238       OT SHORT TERM GOAL #1   Title Pain and tenderness decrease in L hand , wrist to elbow less than 2/10 at the worse    Baseline Pain increase to 5-6/10 over dorsal hand to elbow - worse on dorsal wrist - along path of EDM    Time 3    Period Weeks    Status New    Target Date 09/07/20               OT Long Term Goals - 08/17/20 1240       OT LONG TERM GOAL #1   Title Pt to be independent in use of splinting to increase extention of L 5th digit extention and decrease pain    Baseline pain 5-6/10 over dorsal hand to elbow - extention lag -30 at 5th [facility]    Time 3    Period Weeks    Status New    Target Date 09/07/20      OT LONG TERM GOAL #2   Title AROM in L hand improve to [facility] to do his typing and digital coloring  on computer without issues    Baseline cannot use L 5th digit on computer - -30 at 5th [facility] extention    Time 6    Period Weeks    Status New    Target Date 09/28/20                   Plan - 09/01/20 1225     Clinical Impression Statement Pt present at eval 2 weeks ago with decrease extention of L 5th MC more than PIP, increase pain and tenderness along EDM with worse tenderness over dorsal wrist. Flexion of 5th MC 85, and PIP 90 - extention lag at 5th MC -30. Pt report having earlier this year bad hematoma in L arm from possible tricep injury - and develop some pain over dorsal elbow to hand. Tenderness of 4-5/10 is along EDM - wiht some edema at wrist - pt do have some DRUJ arthritis - ? possilbe rupture or strain and irritation of EDM - Pt also has L 4th digit trigger finger - Pt cont to show decrease tenderness and pain over EDM to 2/10 - extention lag of 5th MC still about -30-  needed  custom hand base extention splint for 4thadn 5th to be modify to dorsal splint to prevent flexion of MC - fitting very well this date and pt can donn and doff - in session wrist extention icnrease to 34 and 5th MC extnetion place and hold -25 - pt ed on wrist  extention stretch , massage to volar palm and flexor tendon - soft tissue to volar hand and forearm to increase extention of wrist and 5th digit- cont to decrease pain over 5th EDM - pt cont to show decrease AROM in L 5th digit, wrist , increase pain and edema - pt can benefit from skilled OT services to improve use of L hand and arm in ADL's and IADl's    OT Occupational Profile and History Problem Focused Assessment - Including review of records relating to presenting problem    Occupational performance deficits (Please refer to evaluation for details): ADL's;IADL's;Rest and Sleep;Leisure;Play;Social Participation    Body Structure / Function / Physical Skills ADL;Flexibility;Edema;IADL;ROM;Pain;Strength;UE functional use    Rehab Potential Fair     Clinical Decision Making Limited treatment options, no task modification necessary    Comorbidities Affecting Occupational Performance: None    Modification or Assistance to Complete Evaluation  No modification of tasks or assist necessary to complete eval    OT Frequency 1x / week    OT Duration 6 weeks    OT Treatment/Interventions Self-care/ADL training;Iontophoresis;Ultrasound;Contrast Bath;DME and/or AE instruction;Splinting;Therapeutic exercise;Manual Therapy;Patient/family education    Consulted and Agree with Plan of Care Patient             Patient will benefit from skilled therapeutic intervention in order to improve the following deficits and impairments:   Body Structure / Function / Physical Skills: ADL, Flexibility, Edema, IADL, ROM, Pain, Strength, UE functional use       Visit Diagnosis: Stiffness of left hand, not elsewhere classified  Stiffness of left wrist, not elsewhere classified  Pain in left arm  Muscle weakness (generalized)    Problem List Patient Active Problem List   Diagnosis Date Noted   Shingles 05/18/2020   Hematoma 03/06/2020   Chronic diastolic heart failure ([place]) 10/25/2019   Hypercalcemia 10/25/2019   Venous stasis ulcer ([place]) 08/20/2019   Chondrocalcinosis 09/05/2018   Primary osteoarthritis of right knee 08/19/2018   Aortic atherosclerosis ([place]) 12/28/2016   Problems with swallowing and mastication    Chronic venous insufficiency 04/12/2016   Thrombocytopenia ([place]) 06/25/2015   Obstructive sleep apnea 06/25/2015   Enlarged prostate without lower urinary tract symptoms ([place]) 10/14/2014   Advanced directives, counseling/discussion 0000000   Uncomplicated asthma 0000000   Encounter for therapeutic drug monitoring 01/30/2013   Routine general medical examination at a health care facility 12/11/2012   TIA (transient ischemic attack) 12/11/2012   Ventricular tachycardia-pause dependent 10/16/2012   Sinus bradycardia  09/07/2012   Bradycardia 09/07/2012   Edema 09/07/2012   Localized edema 09/07/2012   Mixed hyperlipidemia    Abnormal EKG 09/26/2011   Atrial flutter ([place]) 07/21/2010   Encounter for anticoagulation discussion and counseling 07/21/2010   Type 2 diabetes, controlled, with neuropathy ([place])    Tachy-brady syndrome ([facility])    BPH with obstruction/lower urinary tract symptoms    Unspecified asthma(493.90)    GERD (gastroesophageal reflux disease)    Essential hypertension     Kira Hartl OTR/L,CLT 09/01/2020, 12:29 PM  [facility] [address] [place], [address] Phone: [phone]   Fax:  [phone]  Name: Michael Colon MRN: [id number] Date of Birth: 1930-05-24

## 2020-09-08 ENCOUNTER — Ambulatory Visit: Payer: Medicare Other | Attending: Sports Medicine | Admitting: Occupational Therapy

## 2020-09-08 DIAGNOSIS — M6281 Muscle weakness (generalized): Secondary | ICD-10-CM | POA: Diagnosis not present

## 2020-09-08 DIAGNOSIS — M25642 Stiffness of left hand, not elsewhere classified: Secondary | ICD-10-CM | POA: Insufficient documentation

## 2020-09-08 DIAGNOSIS — M79602 Pain in left arm: Secondary | ICD-10-CM | POA: Diagnosis not present

## 2020-09-08 DIAGNOSIS — M25632 Stiffness of left wrist, not elsewhere classified: Secondary | ICD-10-CM | POA: Insufficient documentation

## 2020-09-08 DIAGNOSIS — R600 Localized edema: Secondary | ICD-10-CM | POA: Insufficient documentation

## 2020-09-08 NOTE — Therapy (Signed)
[facility] [address], [place], [address] Phone: [phone]   Fax:  [phone]  Occupational Therapy Treatment  Patient Details  Name: Michael Colon MRN: [id number] Date of Birth: [birth year] Referring Provider (OT): Dr Candelaria Stagers   Encounter Date: 09/08/2020   OT End of Session - 09/08/20 1328     Visit Number 4    Number of Visits 6    Date for OT Re-Evaluation 09/28/20    OT Start Time 1230    OT Stop Time 1321    OT Time Calculation (min) 51 min    Activity Tolerance Patient tolerated treatment well    Behavior During Therapy [facility] for tasks assessed/performed             Past Medical History:  Diagnosis Date   Asthma    Atrial flutter ([place]) 2007   Band keratopathy    Benign prostatic hypertrophy    Chronic ear infection    Chronic kidney disease    Chronic prostatitis    Dental bridge present    permanent - upper   Diabetes mellitus    Elbow stiffness, left    s/p fracture many yrs ago.  arm does not straighten   GERD (gastroesophageal reflux disease)    laryngeal involvement   Hyperlipidemia    Hypertension    Obstructive sleep apnea    CPAP-9   Osteoarthrosis, localized, primary, knee    post-traumatic   Prostatitis    Stroke ([place])    TIA   Tachy-brady syndrome ([place])    UTI (urinary tract infection)     Past Surgical History:  Procedure Laterality Date   APPENDECTOMY     BELPHAROPTOSIS REPAIR     Dr [name]---didn't resolve weepy eye and eyelid drooping   CARDIOVERSION  04/13/2010   CATARACT EXTRACTION, BILATERAL  2009   Chest pain  8/12   Stress test benign   ESOPHAGEAL DILATION  05/26/2016   Procedure: ESOPHAGEAL DILATION;  Surgeon: Lucilla Lame, MD;  Location: [place];  Service: Endoscopy;;   ESOPHAGOGASTRODUODENOSCOPY (EGD) WITH PROPOFOL N/A 05/26/2016   Procedure: ESOPHAGOGASTRODUODENOSCOPY (EGD) WITH PROPOFOL;  Surgeon: Lucilla Lame, MD;  Location:  [place];  Service: Endoscopy;  Laterality: N/A;  Diabetic - oral meds sleep apnea   EYE SURGERY     FRACTURE SURGERY Left    elbow   KNEE SURGERY  1998   plate after fracture, then removed for infection   left elbow surgery     MASTOIDECTOMY  8/08   Dr Idelle Crouch   RHINOPLASTY  5/10   and septoplasty   SUBACROMIAL DECOMPRESSION Right 2005   Arthroscopic (for rotator cuff and biceps tendon ruptures)   TEAR DUCT PROBING  11/13   Dr Vickki Muff   TOTAL KNEE ARTHROPLASTY Left 09/01/2014   Procedure: TOTAL KNEE ARTHROPLASTY;  Surgeon: Dereck Leep, MD;  Location: [facility];  Service: Orthopedics;  Laterality: Left;   TRIGGER FINGER RELEASE Left 02/25/2015   Procedure: LEFT LONG TRIGGER RELEASE;  Surgeon: Dereck Leep, MD;  Location: [facility];  Service: Orthopedics;  Laterality: Left;   VENOUS ABLATION      There were no vitals filed for this visit.   Subjective Assessment - 09/08/20 1326     Subjective  If I touch or massage down here at my wrist - I feel shooting in my middle or index finger- pinkie motion look about the same    Pertinent History Seen DR Candelaria Stagers  08/05/20  - prior to this date in April pt was seen for L arm hematoma iwth possible tricep injury  that happend March 22- had PT at [facility] - and now on 08/05/20 for L 5th digit extention issue - Dr Candelaria Stagers  discussed the nature of his current subjective complaints, clinical examination, test results and have reviewed treatment options. The plan is to do the following;    - The patient has acute loss of fifth digit extension. I explained that he could have caused a tendon rupture from his underlying DRUJ arthritis. I am less suspicious for sagittal band abnormality however this could also cause his symptoms. No obvious findings for trigger finger.  - He has a relatively benign exam otherwise. There is some focal swelling over his DRUJ but this could be chronic from his severe arthritis change. I do not palpate any obvious  retracted tendon throughout the dorsal finger and hand. X-rays show his chronic changes. I discussed the possible causes of his symptoms and that he may need surgical consultation, however, he may still end up with extensor lag of the finger or issues with another finger from tendon transfer. I discussed his case with one of my partners who essentially thought similarly. I referred him to occupational therapy but if he has persistent concerns about his finger, then we can perform diagnostic ultrasound and he can have a consultation with an upper extremity specialist.  - Daily activity as tolerated. Modify activity as needed according to symptoms. No limitations to weight bearing.   - Refer to OT for splinting, home exercise. Continue home exercise program to maintain strength, flexibility, and endurance.  - Use Tylenol, topical diclofenac/pain cream, relative rest, elevation, massage, and ice/heat as needed for pain.  He avoids NSAIDs due to being on warfarin.  - Follow up as needed depending on functional limitations and response to occupational therapy.    Patient Stated Goals I want to be able to ue my pinkie better and straighten it - as well as pain better on top of my hand , wrist to elbow - so I can type on computer , do my photography, do my digital art -    Currently in Pain? Yes    Pain Score 2     Pain Location Wrist    Pain Orientation Left    Pain Descriptors / Indicators Aching    Pain Type Chronic pain                [facility] OT Assessment - 09/08/20 0001       AROM   Right Wrist Extension 70 Degrees    Left Wrist Extension 65 Degrees      Left Hand AROM   L Little  MCP 0-90 --   -28            Pt arrive with extention of 5th MC same - -28 - 30 degrees  Cont to have long standing trigger 4th digit -  Wrist extention this date 65 Pt report tenderness and positive Tinel of L CT - pt did some rolling of volar forearm over roller and was using hard roller at home - possibly  irritated the CT Also hand base extention splint ofr 4th and 5th digit for night time - pulled strap to tight at wrist  Replace with wider and longer strap           OT Treatments/Exercises (OP) - 09/08/20 0001       LUE Contrast  Bath   Time 8 minutes    Comments prior to AROM - decrease CTS              Reinforce for pt to do  contrast to L hand /forearm to decrease edema and pain - tenderness  Done in clinic Soft tissue mobs done to volar hand , CT spreads - pt do have thickening of fascia in palm on 4th and 5th- ? Dupuytrents   Modify last time custom hand base extention splint for 4th and 5th - to use at night time - previous palmar splint allowed some MC flexion  and provided this date wider strap and pt not to pull to tight   composite digits and wrist extention stretch 12 reps prior to  Tendon glides - only MC and PIP flexion  block but can do composite fist to 2 fingers and then one finger out of palm without triggering   10 reps  And enlarge grip around tools or use palm to lift or carry objects  Recommend for pt to make appt for consult with orthopedic surgeon about L hand  Follow up in 2-3 wks with this OT         OT Education - 09/08/20 1328     Education Details progress and HEP    Person(s) Educated Patient    Methods Explanation;Demonstration;Tactile cues;Verbal cues;Handout    Comprehension Returned demonstration;Verbalized understanding              OT Short Term Goals - 08/17/20 1238       OT SHORT TERM GOAL #1   Title Pain and tenderness decrease in L hand , wrist to elbow less than 2/10 at the worse    Baseline Pain increase to 5-6/10 over dorsal hand to elbow - worse on dorsal wrist - along path of EDM    Time 3    Period Weeks    Status New    Target Date 09/07/20               OT Long Term Goals - 08/17/20 1240       OT LONG TERM GOAL #1   Title Pt to be independent in use of splinting to increase extention of L 5th  digit extention and decrease pain    Baseline pain 5-6/10 over dorsal hand to elbow - extention lag -30 at 5th [facility]    Time 3    Period Weeks    Status New    Target Date 09/07/20      OT LONG TERM GOAL #2   Title AROM in L hand improve to [facility] to do his typing and digital coloring on computer without issues    Baseline cannot use L 5th digit on computer - -30 at 5th [facility] extention    Time 6    Period Weeks    Status New    Target Date 09/28/20                   Plan - 09/08/20 1329     Clinical Impression Statement Pt present was refer to OT  with decrease extention of L 5th MC more than PIP, increase pain and tenderness along EDM with worse tenderness over dorsal wrist. Flexion of 5th MC 85, and PIP 90 - extention lag at 5th MC -30. Pt report having earlier this year bad hematoma in L arm from possible tricep injury - and develop some pain over dorsal elbow to hand. Tenderness of  4-5/10 is along EDM - wiht some edema at wrist - pt do have some DRUJ arthritis - ? possilbe rupture or strain and irritation of EDM - Pt also has L 4th digit trigger finger - Pt cont to show decrease tenderness and pain over EDM to 2/10 - extention lag of 5th MC still about -30-  needed  custom hand base extention splint for 4thadn 5th to be modify to dorsal splint to prevent flexion of MC - fitting very well this date and pt can donn and doff - in session wrist extention icnrease to 6 and 5th MC extnetion place and hold -25 - pt ed on wrist  extention stretch , massage to volar palm and flexor tendon - soft tissue to volar hand and forearm to increase extention of wrist and 5th digit- cont to decrease pain over 5th EDM - pt cont to show decrease AROM in L 5th digit, wrist , increase pain and edema - pt can benefit from skilled OT services to improve use of L hand and arm in ADL's and IADl's    OT Occupational Profile and History Problem Focused Assessment - Including review of records relating to presenting  problem    Occupational performance deficits (Please refer to evaluation for details): ADL's;IADL's;Rest and Sleep;Leisure;Play;Social Participation    Body Structure / Function / Physical Skills ADL;Flexibility;Edema;IADL;ROM;Pain;Strength;UE functional use    Rehab Potential Fair    Clinical Decision Making Limited treatment options, no task modification necessary    Comorbidities Affecting Occupational Performance: None    Modification or Assistance to Complete Evaluation  No modification of tasks or assist necessary to complete eval    OT Frequency 1x / week    OT Duration 6 weeks    OT Treatment/Interventions Self-care/ADL training;Iontophoresis;Ultrasound;Contrast Bath;DME and/or AE instruction;Splinting;Therapeutic exercise;Manual Therapy;Patient/family education    Consulted and Agree with Plan of Care Patient             Patient will benefit from skilled therapeutic intervention in order to improve the following deficits and impairments:   Body Structure / Function / Physical Skills: ADL, Flexibility, Edema, IADL, ROM, Pain, Strength, UE functional use       Visit Diagnosis: Stiffness of left hand, not elsewhere classified  Stiffness of left wrist, not elsewhere classified  Pain in left arm  Muscle weakness (generalized)  Localized edema    Problem List Patient Active Problem List   Diagnosis Date Noted   Shingles 05/18/2020   Hematoma 03/06/2020   Chronic diastolic heart failure ([place]) 10/25/2019   Hypercalcemia 10/25/2019   Venous stasis ulcer ([place]) 08/20/2019   Chondrocalcinosis 09/05/2018   Primary osteoarthritis of right knee 08/19/2018   Aortic atherosclerosis ([place]) 12/28/2016   Problems with swallowing and mastication    Chronic venous insufficiency 04/12/2016   Thrombocytopenia () 06/25/2015   Obstructive sleep apnea 06/25/2015   Enlarged prostate without lower urinary tract symptoms ([place]) 10/14/2014   Advanced directives, counseling/discussion  0000000   Uncomplicated asthma 0000000   Encounter for therapeutic drug monitoring 01/30/2013   Routine general medical examination at a health care facility 12/11/2012   TIA (transient ischemic attack) 12/11/2012   Ventricular tachycardia-pause dependent 10/16/2012   Sinus bradycardia 09/07/2012   Bradycardia 09/07/2012   Edema 09/07/2012   Localized edema 09/07/2012   Mixed hyperlipidemia    Abnormal EKG 09/26/2011   Atrial flutter ([place]) 07/21/2010   Encounter for anticoagulation discussion and counseling 07/21/2010   Type 2 diabetes, controlled, with neuropathy ([place])    Tachy-brady syndrome (  [place])    BPH with obstruction/lower urinary tract symptoms    Unspecified asthma(493.90)    GERD (gastroesophageal reflux disease)    Essential hypertension     Kim Oki OTR/L,CLT 09/08/2020, 1:35 PM  [facility] [address] [place], [address] Phone: [phone]   Fax:  [phone]  Name: JAQUAVIOUS SHARMAN MRN: [id number] Date of Birth: 1930/03/07

## 2020-09-21 ENCOUNTER — Other Ambulatory Visit: Payer: Self-pay | Admitting: Internal Medicine

## 2020-09-22 ENCOUNTER — Ambulatory Visit: Payer: Medicare Other | Admitting: Occupational Therapy

## 2020-09-22 DIAGNOSIS — M79602 Pain in left arm: Secondary | ICD-10-CM | POA: Diagnosis not present

## 2020-09-22 DIAGNOSIS — M25632 Stiffness of left wrist, not elsewhere classified: Secondary | ICD-10-CM

## 2020-09-22 DIAGNOSIS — M6281 Muscle weakness (generalized): Secondary | ICD-10-CM

## 2020-09-22 DIAGNOSIS — M25642 Stiffness of left hand, not elsewhere classified: Secondary | ICD-10-CM | POA: Diagnosis not present

## 2020-09-22 DIAGNOSIS — R600 Localized edema: Secondary | ICD-10-CM | POA: Diagnosis not present

## 2020-09-22 NOTE — Therapy (Signed)
[facility] [address], [place], [address] Phone: [phone]   Fax:  [phone]  Occupational Therapy Treatment  Patient Details  Name: Michael Colon MRN: [id number] Date of Birth: 1930-10-10 Referring Provider (OT): Dr Candelaria Stagers   Encounter Date: 09/22/2020   OT End of Session - 09/22/20 1401     Visit Number 5    Number of Visits 6    Date for OT Re-Evaluation 09/28/20    OT Start Time 1320    OT Stop Time 1350    OT Time Calculation (min) 30 min    Activity Tolerance Patient tolerated treatment well    Behavior During Therapy [facility] for tasks assessed/performed             Past Medical History:  Diagnosis Date   Asthma    Atrial flutter ([place]) 2007   Band keratopathy    Benign prostatic hypertrophy    Chronic ear infection    Chronic kidney disease    Chronic prostatitis    Dental bridge present    permanent - upper   Diabetes mellitus    Elbow stiffness, left    s/p fracture many yrs ago.  arm does not straighten   GERD (gastroesophageal reflux disease)    laryngeal involvement   Hyperlipidemia    Hypertension    Obstructive sleep apnea    CPAP-9   Osteoarthrosis, localized, primary, knee    post-traumatic   Prostatitis    Stroke ([place])    TIA   Tachy-brady syndrome ([place])    UTI (urinary tract infection)     Past Surgical History:  Procedure Laterality Date   APPENDECTOMY     BELPHAROPTOSIS REPAIR     Dr [name]---didn't resolve weepy eye and eyelid drooping   CARDIOVERSION  04/13/2010   CATARACT EXTRACTION, BILATERAL  2009   Chest pain  8/12   Stress test benign   ESOPHAGEAL DILATION  05/26/2016   Procedure: ESOPHAGEAL DILATION;  Surgeon: Lucilla Lame, MD;  Location: [place];  Service: Endoscopy;;   ESOPHAGOGASTRODUODENOSCOPY (EGD) WITH PROPOFOL N/A 05/26/2016   Procedure: ESOPHAGOGASTRODUODENOSCOPY (EGD) WITH PROPOFOL;  Surgeon: Lucilla Lame, MD;  Location:  [place];  Service: Endoscopy;  Laterality: N/A;  Diabetic - oral meds sleep apnea   EYE SURGERY     FRACTURE SURGERY Left    elbow   KNEE SURGERY  1998   plate after fracture, then removed for infection   left elbow surgery     MASTOIDECTOMY  8/08   Dr Idelle Crouch   RHINOPLASTY  5/10   and septoplasty   SUBACROMIAL DECOMPRESSION Right 2005   Arthroscopic (for rotator cuff and biceps tendon ruptures)   TEAR DUCT PROBING  11/13   Dr Vickki Muff   TOTAL KNEE ARTHROPLASTY Left 09/01/2014   Procedure: TOTAL KNEE ARTHROPLASTY;  Surgeon: Dereck Leep, MD;  Location: [facility];  Service: Orthopedics;  Laterality: Left;   TRIGGER FINGER RELEASE Left 02/25/2015   Procedure: LEFT LONG TRIGGER RELEASE;  Surgeon: Dereck Leep, MD;  Location: [facility];  Service: Orthopedics;  Laterality: Left;   VENOUS ABLATION      There were no vitals filed for this visit.   Subjective Assessment - 09/22/20 1358     Subjective  I have use my massager on my forearm and hand and put toungh depressor in the splint to keep pinkie striaght - but I think it is about same- pain better  -but trigger  finger still on the ring finger    Pertinent History Seen DR Candelaria Stagers 08/05/20  - prior to this date in April pt was seen for L arm hematoma iwth possible tricep injury  that happend March 22- had PT at [facility] - and now on 08/05/20 for L 5th digit extention issue - Dr Candelaria Stagers  discussed the nature of his current subjective complaints, clinical examination, test results and have reviewed treatment options. The plan is to do the following;    - The patient has acute loss of fifth digit extension. I explained that he could have caused a tendon rupture from his underlying DRUJ arthritis. I am less suspicious for sagittal band abnormality however this could also cause his symptoms. No obvious findings for trigger finger.  - He has a relatively benign exam otherwise. There is some focal swelling over his DRUJ but this could be  chronic from his severe arthritis change. I do not palpate any obvious retracted tendon throughout the dorsal finger and hand. X-rays show his chronic changes. I discussed the possible causes of his symptoms and that he may need surgical consultation, however, he may still end up with extensor lag of the finger or issues with another finger from tendon transfer. I discussed his case with one of my partners who essentially thought similarly. I referred him to occupational therapy but if he has persistent concerns about his finger, then we can perform diagnostic ultrasound and he can have a consultation with an upper extremity specialist.  - Daily activity as tolerated. Modify activity as needed according to symptoms. No limitations to weight bearing.   - Refer to OT for splinting, home exercise. Continue home exercise program to maintain strength, flexibility, and endurance.  - Use Tylenol, topical diclofenac/pain cream, relative rest, elevation, massage, and ice/heat as needed for pain.  He avoids NSAIDs due to being on warfarin.  - Follow up as needed depending on functional limitations and response to occupational therapy.    Patient Stated Goals I want to be able to ue my pinkie better and straighten it - as well as pain better on top of my hand , wrist to elbow - so I can type on computer , do my photography, do my digital art -    Currently in Pain? No/denies                [facility] OT Assessment - 09/22/20 0001       AROM   Right Wrist Extension 58 Degrees    Right Wrist Flexion 84 Degrees    Left Wrist Extension 65 Degrees    Left Wrist Flexion 85 Degrees      Left Hand AROM   L Index  MCP 0-90 85 Degrees    L Index PIP 0-100 90 Degrees    L Long  MCP 0-90 85 Degrees    L Long PIP 0-100 90 Degrees    L Ring  MCP 0-90 90 Degrees    L Ring PIP 0-100 95 Degrees    L Little  MCP 0-90 90 Degrees   -28   L Little PIP 0-100 100 Degrees              Pt arrive with cont great progress  in edema , tightness in L forearm , wrist and hand - wearing exention splint for 4th ad 5th digits night time -review again for pt not to do strap to tight over volar wrist Great progress since eval in AROM  in wrist and digits flexion -and pain over dorsal hand ,wrist and forearm Extention lag same in 5th [facility] -  Soft tissue mobs done to volar hand , CT spreads - pt do have thickening of fascia in palm on 4th and 5th- ? Dupuytrents and then cont trigger finger that only locks when gripping something tight       composite digits and wrist extention stretch review with pt again 20 reps - table slides - pain free - pt wrist AROM did decrease since last time   Tendon glides - only MC and PIP flexion  block but can do composite fist to 1-2 fingers and then one finger out of palm without triggering    10 reps  And enlarge grip around tools or use palm to lift or carry objects  Recommend for pt to make appt fwith DR Candelaria Stagers for ? Shot for 4th trigger finger   Follow up as needed          OT Education - 09/22/20 1400     Education Details progress and changes to HEP- refer to Dr Candelaria Stagers for shot    [employer]) Educated Patient    Methods Explanation;Demonstration;Tactile cues;Verbal cues;Handout    Comprehension Returned demonstration;Verbalized understanding              OT Short Term Goals - 08/17/20 1238       OT SHORT TERM GOAL #1   Title Pain and tenderness decrease in L hand , wrist to elbow less than 2/10 at the worse    Baseline Pain increase to 5-6/10 over dorsal hand to elbow - worse on dorsal wrist - along path of EDM    Time 3    Period Weeks    Status New    Target Date 09/07/20               OT Long Term Goals - 08/17/20 1240       OT LONG TERM GOAL #1   Title Pt to be independent in use of splinting to increase extention of L 5th digit extention and decrease pain    Baseline pain 5-6/10 over dorsal hand to elbow - extention lag -30 at 5th [facility]    Time 3     Period Weeks    Status New    Target Date 09/07/20      OT LONG TERM GOAL #2   Title AROM in L hand improve to [facility] to do his typing and digital coloring on computer without issues    Baseline cannot use L 5th digit on computer - -30 at 5th [facility] extention    Time 6    Period Weeks    Status New    Target Date 09/28/20                   Plan - 09/22/20 1401     Clinical Impression Statement Pt at OT eval presented with decrease extention of L 5th MC more than PIP, increase pain and tenderness along EDM with worse tenderness over dorsal wrist. Flexion of 5th MC 85, and PIP 90 - extention lag at 5th MC -30. Pt report having earlier this year bad hematoma in L arm from possible tricep injury - and develop some pain over dorsal elbow to hand. Tenderness of 4-5/10 is along EDM - wiht some edema at wrist - pt do have some DRUJ arthritis - ? possilbe rupture or strain and irritation of EDM - Pt also has  L 4th digit trigger finger - Pt was seen for 5 sessions and showed increase wrist AROM , decrease pain and tenderness , but cont to have extention lag of -28 at 5th  [facility] and trigger finger on 4th- Triggering when grapping object or bar tight. Pt using hand normally except tight grip , show increase digits flexion too - ?dupuytrens early stages both hands, dont thing tendon rupture - pt show active extention of PIP and MC of 5th - - pt to cont with composite exention stretch for L wrist and digits ,  tendon glides, extention splint wearing night time -and call DR Candelaria Stagers for ? shot for 4th trigger finger. Pt to follow up if needed.    OT Occupational Profile and History Problem Focused Assessment - Including review of records relating to presenting problem    Occupational performance deficits (Please refer to evaluation for details): ADL's;IADL's;Rest and Sleep;Leisure;Play;Social Participation    Body Structure / Function / Physical Skills ADL;Flexibility;Edema;IADL;ROM;Pain;Strength;UE functional  use    Rehab Potential Fair    Clinical Decision Making Limited treatment options, no task modification necessary    Comorbidities Affecting Occupational Performance: None    Modification or Assistance to Complete Evaluation  No modification of tasks or assist necessary to complete eval    OT Frequency Monthly    OT Duration 4 weeks    OT Treatment/Interventions Self-care/ADL training;Iontophoresis;Ultrasound;Contrast Bath;DME and/or AE instruction;Splinting;Therapeutic exercise;Manual Therapy;Patient/family education    Consulted and Agree with Plan of Care Patient             Patient will benefit from skilled therapeutic intervention in order to improve the following deficits and impairments:   Body Structure / Function / Physical Skills: ADL, Flexibility, Edema, IADL, ROM, Pain, Strength, UE functional use       Visit Diagnosis: Stiffness of left hand, not elsewhere classified  Stiffness of left wrist, not elsewhere classified  Pain in left arm  Muscle weakness (generalized)    Problem List Patient Active Problem List   Diagnosis Date Noted   Shingles 05/18/2020   Hematoma 03/06/2020   Chronic diastolic heart failure ([place]) 10/25/2019   Hypercalcemia 10/25/2019   Venous stasis ulcer ([place]) 08/20/2019   Chondrocalcinosis 09/05/2018   Primary osteoarthritis of right knee 08/19/2018   Aortic atherosclerosis ([place]) 12/28/2016   Problems with swallowing and mastication    Chronic venous insufficiency 04/12/2016   Thrombocytopenia ([place]) 06/25/2015   Obstructive sleep apnea 06/25/2015   Enlarged prostate without lower urinary tract symptoms (luts) 10/14/2014   Advanced directives, counseling/discussion 00/37/0488   Uncomplicated asthma 89/16/9450   Encounter for therapeutic drug monitoring 01/30/2013   Routine general medical examination at a health care facility 12/11/2012   TIA (transient ischemic attack) 12/11/2012   Ventricular tachycardia-pause dependent 10/16/2012    Sinus bradycardia 09/07/2012   Bradycardia 09/07/2012   Edema 09/07/2012   Localized edema 09/07/2012   Mixed hyperlipidemia    Abnormal EKG 09/26/2011   Atrial flutter ([place]) 07/21/2010   Encounter for anticoagulation discussion and counseling 07/21/2010   Type 2 diabetes, controlled, with neuropathy ([place])    Tachy-brady syndrome ([place])    BPH with obstruction/lower urinary tract symptoms    Unspecified asthma(493.90)    GERD (gastroesophageal reflux disease)    Essential hypertension     Rosalyn Gess, OTR/L,CLT 09/22/2020, 2:48 PM  [place] [address] [place], [address] Phone: [phone]   Fax:  [phone]  Name: COMMODORE BELLEW MRN:  [id number] Date of Birth: 1930-07-29

## 2020-09-23 ENCOUNTER — Other Ambulatory Visit: Payer: Self-pay

## 2020-09-23 ENCOUNTER — Encounter: Payer: Self-pay | Admitting: Cardiovascular Disease

## 2020-09-23 ENCOUNTER — Ambulatory Visit: Payer: Medicare Other | Admitting: Cardiovascular Disease

## 2020-09-23 ENCOUNTER — Ambulatory Visit (INDEPENDENT_AMBULATORY_CARE_PROVIDER_SITE_OTHER): Payer: Medicare Other

## 2020-09-23 VITALS — BP 140/70 | HR 55 | Ht 71.0 in | Wt 208.0 lb

## 2020-09-23 DIAGNOSIS — I495 Sick sinus syndrome: Secondary | ICD-10-CM

## 2020-09-23 DIAGNOSIS — I10 Essential (primary) hypertension: Secondary | ICD-10-CM | POA: Diagnosis not present

## 2020-09-23 DIAGNOSIS — I4892 Unspecified atrial flutter: Secondary | ICD-10-CM | POA: Diagnosis not present

## 2020-09-23 DIAGNOSIS — I5032 Chronic diastolic (congestive) heart failure: Secondary | ICD-10-CM

## 2020-09-23 DIAGNOSIS — Z5181 Encounter for therapeutic drug level monitoring: Secondary | ICD-10-CM

## 2020-09-23 DIAGNOSIS — I77810 Thoracic aortic ectasia: Secondary | ICD-10-CM | POA: Diagnosis not present

## 2020-09-23 DIAGNOSIS — I872 Venous insufficiency (chronic) (peripheral): Secondary | ICD-10-CM

## 2020-09-23 DIAGNOSIS — I700 Atherosclerosis of aorta: Secondary | ICD-10-CM

## 2020-09-23 LAB — POCT INR: INR: 2.7 (ref 2.0–3.0)

## 2020-09-23 NOTE — Patient Instructions (Signed)
-   continue warfarin dosage of 1 tablets daily except 1.5 tablet on SUNDAYS, TUESDAYS & THURSDAYS.   - Recheck in 6 weeks.   Please be consistent with your green intake - please pick a day or days each week to have your greens and have them every week on that day (your pills are on a schedule, so your greens need to be on one)

## 2020-09-23 NOTE — Progress Notes (Signed)
Date:  09/23/2020   ID:  Michael Colon, DOB [birth year], MRN [id number]  Patient Location:  [place] [address]   Provider location:   [facility], [place] office  PCP:  Venia Carbon, MD  Cardiologist:  Arvid Right [facility]  Chief Complaint  Patient presents with   6 month follow up     Patient c/o shortness of breath & LE edema. Medications reviewed by the patient verbally.     History of Present Illness:    Michael Colon is a 87 y.o. male  past medical history of CT calcium score 1300,  atrial flutter (3/14), previous ablation , on warfarin,  hypertension,  borderline diabetes,  Obstructive sleep apnea  [facility] on August 04, 2010 with chest pain.  Symptoms started after eating and it was felt he had GI spasm or hiatal hernia. Mild improvement in symptoms with nitroglycerin and GI cocktail in the emergency room. He is very active at baseline.  He reports having a TIA some time in 2014, workup negative at [facility]  Previous vein ablation surgery at [facility] diabetes type 2 S/p  right great saphenous vein and small saphenous vein laser ablation. His postprocedural duplex showed successful ablation without DVT. He presents today for follow-up of his atrial flutter, hyperlipidemia  Weight down, in 2018 weight was 240 Through dietary changes  Visit 03/2020 Left arm trauma Shingles "for 4 months", in throat  Very active with hobbies No regular exercise program  Continues to wear compression hose Leg swelling stable  Denies orthostasis, no near syncope or syncope No tachypalpitations concerning for arrhythmia  labs HAB1C over 7 Total chol 110, LDL 36  EKG personally reviewed by myself on todays visit Normal sinus rhythm first-degree AV block, unable to exclude second-degree AV block type I right bundle branch block  echocardiogram October 2021 Normal ejection fraction asymmetric left ventricular  hypertrophy of the basal-septal  segment Aorta 38 mm Mildly elevated right heart pressures  Lab work reviewed most recent creatinine 0.94 BUN 31, improvement from September BUN 42 creatinine 1.01  Chronic leg swelling worse on the left leg than the right secondary to previous knee surgeries  Previously evaluated for bradycardia. Pacemaker was not recommended at that time. Previously seen by Dr. Caryl Comes, EP  Previous Holter monitor showed normal sinus rhythm with pauses up to 2.86 seconds, bradycardia with heart rates in the 20s to 30s at nighttime, 30s to 50 during the daytime.  Hemoglobin A1c in December 2014 7.5, total cholesterol up from 132 now 200  CT calcium score 1300,  stress test 04/2015 Following the CT scan, no ischemia    Past Medical History:  Diagnosis Date   Asthma    Atrial flutter ([place]) 2007   Band keratopathy    Benign prostatic hypertrophy    Chronic ear infection    Chronic kidney disease    Chronic prostatitis    Dental bridge present    permanent - upper   Diabetes mellitus    Elbow stiffness, left    s/p fracture many yrs ago.  arm does not straighten   GERD (gastroesophageal reflux disease)    laryngeal involvement   Hyperlipidemia    Hypertension    Obstructive sleep apnea    CPAP-9   Osteoarthrosis, localized, primary, knee    post-traumatic   Prostatitis    Stroke ([place])    TIA   Tachy-brady syndrome ([place])    UTI (urinary tract infection)  Past Surgical History:  Procedure Laterality Date   APPENDECTOMY     BELPHAROPTOSIS REPAIR     Dr [name]---didn't resolve weepy eye and eyelid drooping   CARDIOVERSION  04/13/2010   CATARACT EXTRACTION, BILATERAL  2009   Chest pain  8/12   Stress test benign   ESOPHAGEAL DILATION  05/26/2016   Procedure: ESOPHAGEAL DILATION;  Surgeon: Lucilla Lame, MD;  Location: [place];  Service: Endoscopy;;   ESOPHAGOGASTRODUODENOSCOPY (EGD) WITH PROPOFOL N/A 05/26/2016   Procedure: ESOPHAGOGASTRODUODENOSCOPY (EGD) WITH PROPOFOL;   Surgeon: Lucilla Lame, MD;  Location: [place];  Service: Endoscopy;  Laterality: N/A;  Diabetic - oral meds sleep apnea   EYE SURGERY     FRACTURE SURGERY Left    elbow   KNEE SURGERY  1998   plate after fracture, then removed for infection   left elbow surgery     MASTOIDECTOMY  8/08   Dr Idelle Crouch   RHINOPLASTY  5/10   and septoplasty   SUBACROMIAL DECOMPRESSION Right 2005   Arthroscopic (for rotator cuff and biceps tendon ruptures)   TEAR DUCT PROBING  11/13   Dr Vickki Muff   TOTAL KNEE ARTHROPLASTY Left 09/01/2014   Procedure: TOTAL KNEE ARTHROPLASTY;  Surgeon: Dereck Leep, MD;  Location: [facility];  Service: Orthopedics;  Laterality: Left;   TRIGGER FINGER RELEASE Left 02/25/2015   Procedure: LEFT LONG TRIGGER RELEASE;  Surgeon: Dereck Leep, MD;  Location: [facility];  Service: Orthopedics;  Laterality: Left;   VENOUS ABLATION       Current Meds  Medication Sig   APPLE CIDER VINEGAR PO Take by mouth.   atorvastatin (LIPITOR) 80 MG tablet Take 1 tablet (80 mg total) by mouth daily.   cetirizine (ZYRTEC) 10 MG tablet Take 10 mg by mouth daily.   Cyanocobalamin (B-12) 2500 MCG SUBL Place under the tongue.   docusate sodium (COLACE) 100 MG capsule Take 100 mg by mouth 2 (two) times daily.   famotidine (PEPCID) 20 MG tablet Take 20 mg by mouth daily.   finasteride (PROSCAR) 5 MG tablet TAKE 1 TABLET BY MOUTH ONCE DAILY   furosemide (LASIX) 20 MG tablet Take 1 tablet (20 mg total) by mouth daily as needed (Extra pill as needed for weight 215 or higher). Once daily and extra pill as needed for weight 215 or higher.   glucose blood (BAYER CONTOUR TEST) test strip USE STRIP TO TEST BLOOD SUGAR ONCE DAILY. Dx Code: E11.40   ibuprofen (ADVIL,MOTRIN) 200 MG tablet Take 400 mg by mouth 2 (two) times daily.    loratadine (CLARITIN) 10 MG tablet Take 10 mg by mouth daily.   metFORMIN (GLUCOPHAGE) 500 MG tablet TAKE 1 TABLET BY MOUTH  TWICE DAILY WITH MEALS   Microlet Lancets MISC  Use to test twice daily or as directed   tamsulosin (FLOMAX) 0.4 MG CAPS capsule TAKE 1 CAPSULE BY MOUTH  DAILY   triamterene-hydrochlorothiazide (DYAZIDE) 37.5-25 MG capsule Take 1 each (1 capsule total) by mouth every morning.   warfarin (COUMADIN) 5 MG tablet TAKE 1 TO 1 AND 1/2 TABLETS BY MOUTH DAILY AS DIRECTED  BY COUMADIN CLINIC     Allergies:   Enoxaparin, Enoxaparin sodium, Latex, Nickel, Percocet [oxycodone-acetaminophen], and Tape   Social History   Tobacco Use   Smoking status: Former    Packs/day: 1.00    Years: 0.00    Pack years: 0.00    Types: Cigarettes    Quit date: 01/03/1969    Years since quitting:  51.7   Smokeless tobacco: Never  Vaping Use   Vaping Use: Never used  Substance Use Topics   Alcohol use: No    Alcohol/week: 1.0 standard drink    Types: 1 Glasses of wine per week    Comment: rare wine. 1-2x/yr.   Drug use: No     Family Hx: The patient's family history includes Colon cancer in his father; Diabetes in his father; Hypertension in his father; Other in his mother. There is no history of Heart attack or Stroke.  ROS:   Please see the history of present illness.    Review of Systems  Constitutional: Negative.   HENT: Negative.    Respiratory: Negative.    Cardiovascular: Negative.   Gastrointestinal: Negative.   Musculoskeletal: Negative.   Neurological: Negative.   Psychiatric/Behavioral: Negative.    All other systems reviewed and are negative.   Labs/Other Tests and Data Reviewed:    Recent Labs: 03/08/2020: ALT 17; BUN 28; Creatinine, Ser 0.87; Hemoglobin 13.1; Platelets 127; Potassium 3.7; Sodium 135   Recent Lipid Panel Lab Results  Component Value Date/Time   CHOL 120 02/21/2020 10:48 AM   TRIG 56.0 02/21/2020 10:48 AM   HDL 72.20 02/21/2020 10:48 AM   CHOLHDL 2 02/21/2020 10:48 AM   LDLCALC 36 02/21/2020 10:48 AM    Wt Readings from Last 3 Encounters:  09/23/20 208 lb (94.3 kg)  08/24/20 205 lb (93 kg)  05/18/20 208 lb  1.6 oz (94.4 kg)     Exam:    BP 140/70 (BP Location: Left Arm, Patient Position: Sitting, Cuff Size: Normal)   Pulse (!) 55   Ht 5\' 11"  (1.803 m)   Wt 208 lb (94.3 kg)   SpO2 97%   BMI 29.01 kg/m  Constitutional:  oriented to person, place, and time. No distress.  HENT:  Head: Grossly normal Eyes:  no discharge. No scleral icterus.  Neck: No JVD, no carotid bruits  Cardiovascular: Regular rate and rhythm, no murmurs appreciated Pulmonary/Chest: Clear to auscultation bilaterally, no wheezes or rails Abdominal: Soft.  no distension.  no tenderness.  Musculoskeletal: Normal range of motion Left arm markedly swollen, hematoma on forearm, ecchymosis from shoulder down to fingers Neurological:  normal muscle tone. Coordination normal. No atrophy Skin: Skin warm and dry Psychiatric: normal affect, pleasant  ASSESSMENT & PLAN:     Atrial fibrillation/flutter On anticoagulation,  Maintaining normal sinus rhythm  Chronic diastolic CHF Goal weight 409, typically takes extra Lasix for weight 215 Appears relatively euvolemic on today's visit  Tachy-brady syndrome (HCC) Stable, asymptomatic bradycardia, first-degree AV block, right bundle branch block  Mixed hyperlipidemia Cholesterol is at goal on the current lipid regimen. No changes to the medications were made.  Essential hypertension Blood pressure is well controlled on today's visit. No changes made to the medications.  Type 2 diabetes, controlled, with neuropathy (HCC) A1c 6 up to 7.6, high end of the range  Aortic atherosclerosis (HCC) Cholesterol is at goal on the current lipid regimen. No changes to the medications were made.  Obstructive sleep apnea On cpap, weight down Followed by pulmonary    Total encounter time more than 25 minutes  Greater than 50% was spent in counseling and coordination of care with the patient   Signed, Ida Rogue, MD  09/23/2020 11:30 AM    [place] Office [address], [place], [place] [address]

## 2020-09-23 NOTE — Patient Instructions (Signed)
Medication Instructions:  No changes  If you need a refill on your cardiac medications before your next appointment, please call your pharmacy.    Lab work: No new labs needed   If you have labs (blood work) drawn today and your tests are completely normal, you will receive your results only by: MyChart Message (if you have MyChart) OR A paper copy in the mail If you have any lab test that is abnormal or we need to change your treatment, we will call you to review the results.   Testing/Procedures: No new testing needed   Follow-Up: At [facility], you and your health needs are our priority.  As part of our continuing mission to provide you with exceptional heart care, we have created designated Provider Care Teams.  These Care Teams include your primary Cardiologist (physician) and Advanced Practice Providers (APPs -  Physician Assistants and Nurse Practitioners) who all work together to provide you with the care you need, when you need it.  You will need a follow up appointment in 12 months  Providers on your designated Care Team:   Christopher Berge, NP Ryan Dunn, PA-C Jacquelyn Visser, PA-C Cadence Furth, PA-C  Any Other Special Instructions Will Be Listed Below (If Applicable).  COVID-19 Vaccine Information can be found at: [email or web address] For questions related to vaccine distribution or appointments, please email [email or web address] or call [phone]    

## 2020-09-29 DIAGNOSIS — S66812D Strain of other specified muscles, fascia and tendons at wrist and hand level, left hand, subsequent encounter: Secondary | ICD-10-CM | POA: Diagnosis not present

## 2020-09-29 DIAGNOSIS — M1711 Unilateral primary osteoarthritis, right knee: Secondary | ICD-10-CM | POA: Diagnosis not present

## 2020-09-29 DIAGNOSIS — M720 Palmar fascial fibromatosis [Dupuytren]: Secondary | ICD-10-CM | POA: Diagnosis not present

## 2020-09-29 DIAGNOSIS — G8929 Other chronic pain: Secondary | ICD-10-CM | POA: Diagnosis not present

## 2020-09-29 DIAGNOSIS — M19132 Post-traumatic osteoarthritis, left wrist: Secondary | ICD-10-CM | POA: Diagnosis not present

## 2020-09-29 DIAGNOSIS — M25542 Pain in joints of left hand: Secondary | ICD-10-CM | POA: Diagnosis not present

## 2020-09-29 DIAGNOSIS — M25561 Pain in right knee: Secondary | ICD-10-CM | POA: Diagnosis not present

## 2020-09-29 DIAGNOSIS — M25642 Stiffness of left hand, not elsewhere classified: Secondary | ICD-10-CM | POA: Diagnosis not present

## 2020-09-29 DIAGNOSIS — M65342 Trigger finger, left ring finger: Secondary | ICD-10-CM | POA: Diagnosis not present

## 2020-10-13 ENCOUNTER — Telehealth: Payer: Self-pay | Admitting: Cardiovascular Disease

## 2020-10-13 NOTE — Telephone Encounter (Signed)
Patient called [facility] billing to discuss statement received that he believes are being coded and billed incorrectly for a while.   Billing called heartcare for support in discussing with patient.   Patient states he does not receive a PT in the office only an INR but has not been able to get any help fixing the inappropriate charges for a while.      Patient advised a request for [facility] billing to to review billing charges for his anticoag appts.   He would like a call back to discuss whether correct and explain or incorrect and how it will be fixed.

## 2020-10-15 NOTE — Telephone Encounter (Signed)
Returned patient's phone call. Details are noted in account notes.

## 2020-11-04 ENCOUNTER — Ambulatory Visit (INDEPENDENT_AMBULATORY_CARE_PROVIDER_SITE_OTHER): Payer: Medicare Other

## 2020-11-04 ENCOUNTER — Other Ambulatory Visit: Payer: Self-pay

## 2020-11-04 DIAGNOSIS — Z5181 Encounter for therapeutic drug level monitoring: Secondary | ICD-10-CM | POA: Diagnosis not present

## 2020-11-04 DIAGNOSIS — I4892 Unspecified atrial flutter: Secondary | ICD-10-CM

## 2020-11-04 LAB — POCT INR: INR: 1.9 — AB (ref 2.0–3.0)

## 2020-11-04 NOTE — Patient Instructions (Signed)
-   take extra 1/2 tablet warfarin tonight, then - continue warfarin dosage of 1 tablets daily except 1.5 tablet on SUNDAYS, TUESDAYS & THURSDAYS.   - Recheck in 6 weeks.   Please be consistent with your green intake - please pick a day or days each week to have your greens and have them every week on that day (your pills are on a schedule, so your greens need to be on one)

## 2020-11-10 DIAGNOSIS — H1132 Conjunctival hemorrhage, left eye: Secondary | ICD-10-CM | POA: Diagnosis not present

## 2020-11-30 ENCOUNTER — Ambulatory Visit (INDEPENDENT_AMBULATORY_CARE_PROVIDER_SITE_OTHER): Payer: Medicare Other | Admitting: Family

## 2020-11-30 ENCOUNTER — Encounter: Payer: Self-pay | Admitting: Family

## 2020-11-30 ENCOUNTER — Other Ambulatory Visit: Payer: Self-pay

## 2020-11-30 VITALS — BP 122/64 | HR 56 | Temp 97.9°F | Ht 71.0 in | Wt 206.0 lb

## 2020-11-30 DIAGNOSIS — B351 Tinea unguium: Secondary | ICD-10-CM | POA: Diagnosis not present

## 2020-11-30 DIAGNOSIS — N50811 Right testicular pain: Secondary | ICD-10-CM | POA: Diagnosis not present

## 2020-11-30 DIAGNOSIS — R10819 Abdominal tenderness, unspecified site: Secondary | ICD-10-CM | POA: Diagnosis not present

## 2020-11-30 DIAGNOSIS — N452 Orchitis: Secondary | ICD-10-CM | POA: Insufficient documentation

## 2020-11-30 DIAGNOSIS — R6883 Chills (without fever): Secondary | ICD-10-CM | POA: Diagnosis not present

## 2020-11-30 HISTORY — DX: Orchitis: N45.2

## 2020-11-30 LAB — CBC WITH DIFFERENTIAL/PLATELET
Absolute Monocytes: 1544 cells/uL — ABNORMAL HIGH (ref 200–950)
Basophils Absolute: 32 cells/uL (ref 0–200)
Basophils Relative: 0.4 %
Eosinophils Absolute: 192 cells/uL (ref 15–500)
Eosinophils Relative: 2.4 %
HCT: 41.7 % (ref 38.5–50.0)
Hemoglobin: 13.9 g/dL (ref 13.2–17.1)
Lymphs Abs: 976 cells/uL (ref 850–3900)
MCH: 31 pg (ref 27.0–33.0)
MCHC: 33.3 g/dL (ref 32.0–36.0)
MCV: 92.9 fL (ref 80.0–100.0)
MPV: 11.5 fL (ref 7.5–12.5)
Monocytes Relative: 19.3 %
Neutro Abs: 5256 cells/uL (ref 1500–7800)
Neutrophils Relative %: 65.7 %
Platelets: 131 10*3/uL — ABNORMAL LOW (ref 140–400)
RBC: 4.49 10*6/uL (ref 4.20–5.80)
RDW: 12 % (ref 11.0–15.0)
Total Lymphocyte: 12.2 %
WBC: 8 10*3/uL (ref 3.8–10.8)

## 2020-11-30 MED ORDER — CICLOPIROX 8 % EX SOLN: Freq: Every day | CUTANEOUS | 0 refills | Status: DC

## 2020-11-30 MED ORDER — DOXYCYCLINE HYCLATE 100 MG PO TABS: 100.0000 mg | ORAL_TABLET | Freq: Two times a day (BID) | ORAL | 0 refills | Status: DC

## 2020-11-30 MED ORDER — DOXYCYCLINE HYCLATE 100 MG PO TABS: 100.0000 mg | ORAL_TABLET | Freq: Two times a day (BID) | ORAL | 0 refills | Status: AC

## 2020-11-30 NOTE — Patient Instructions (Addendum)
I have ordered a stat ultrasound as well as a stat ambulatory referral to urology.    [place] is scheduled for 12/01/20 at 11 am. Check in is at 10:45 am. This will be located at :  [facility]  [address], [place] also have a urology appointment 12/01/20 at 1:45 with Dr. Diamantina Providence. This will be located at:  [place] [facility] [address], [place], [place] [address]  Phone: [phone]   If you have any worsening symptoms and/or pain over the next 24 hours please go to the emergency room immediately.  Antibiotics have been prescribed today please take as directed.  It was a pleasure seeing you today! Please do not hesitate to reach out with any questions and or concerns.  Regards,   Eugenia Pancoast

## 2020-11-30 NOTE — Assessment & Plan Note (Addendum)
Suspected orchitis, advised pt to start antbx which has been sent to preferred pharmacy. If any worsening chlils/fever/pain pt advised to go to the ER immediately. Pt referred to urology for a stat referral as well. U/s scrotum with doppler also ordered stat, pending results as well as cbc and urinalysis.

## 2020-11-30 NOTE — Assessment & Plan Note (Signed)
Stat scrotal ultrasound ordered pending results.  Patient aware that if any worsening pain to go to emergency room immediately.

## 2020-11-30 NOTE — Progress Notes (Signed)
Subjective:     Patient ID: Michael Colon, male    DOB: 1930-09-20, 87 y.o.   MRN: [id number]  Chief Complaint  Patient presents with   Groin Pain    Right side Right testicle is about 5 times larger per patient Has discomfort in right one hard to sit or stand from sitting uncomfortable.     Groin Pain Associated symptoms include chills, constipation (unable to have a complete bowel movement without pain on straining for last five days) and a rash (funal infection toenails pt requesting medication). Pertinent negatives include no abdominal pain, diarrhea, dysuria (foul urinary odor), fever, frequency, nausea, urgency or vomiting.  Patient is in today for five day h/o chills, right inguinal pain, and a swollen testicle that is 'five times normal'. Didn't check his temperature, unknown if with fever. The swelling of the testicle has increased in size over the last five days. When he rises from sitting the pain worsens.   Difficult to have a bowel movement with a suppository because it hurts to strain.   Denies urinary urgency, frequency, back pain, and or dysuria/hematuria. Does have post dribble urine.  Denies risk or exposure to STD.  Patient would also like to know if there is anything that he can use for his toenail fungus that is on all 10 toes.  Has been there for some time.  He has tried over-the-counter medication with no improvement of symptoms.    Health Maintenance Due  Topic Date Due   Zoster Vaccines- Shingrix (1 of 2) Never done   COVID-19 Vaccine (3 - Moderna risk series) 03/15/2019   TETANUS/TDAP  06/03/2020   INFLUENZA VACCINE  08/03/2020    Past Medical History:  Diagnosis Date   Asthma    Atrial flutter ([place]) 2007   Band keratopathy    Benign prostatic hypertrophy    Chronic ear infection    Chronic kidney disease    Chronic prostatitis    Dental bridge present    permanent - upper   Diabetes mellitus    Elbow stiffness, left    s/p fracture many  yrs ago.  arm does not straighten   GERD (gastroesophageal reflux disease)    laryngeal involvement   Hyperlipidemia    Hypertension    Obstructive sleep apnea    CPAP-9   Osteoarthrosis, localized, primary, knee    post-traumatic   Prostatitis    Stroke ([place])    TIA   Tachy-brady syndrome ([place])    UTI (urinary tract infection)     Past Surgical History:  Procedure Laterality Date   APPENDECTOMY     BELPHAROPTOSIS REPAIR     Dr Michael Colon---didn't resolve weepy eye and eyelid drooping   CARDIOVERSION  04/13/2010   CATARACT EXTRACTION, BILATERAL  2009   Chest pain  8/12   Stress test benign   ESOPHAGEAL DILATION  05/26/2016   Procedure: ESOPHAGEAL DILATION;  Surgeon: Michael Lame, MD;  Location: [facility];  Service: Endoscopy;;   ESOPHAGOGASTRODUODENOSCOPY (EGD) WITH PROPOFOL N/A 05/26/2016   Procedure: ESOPHAGOGASTRODUODENOSCOPY (EGD) WITH PROPOFOL;  Surgeon: Michael Lame, MD;  Location: [facility];  Service: Endoscopy;  Laterality: N/A;  Diabetic - oral meds sleep apnea   EYE SURGERY     FRACTURE SURGERY Left    elbow   KNEE SURGERY  1998   plate after fracture, then removed for infection   left elbow surgery     MASTOIDECTOMY  8/08   Dr Michael Colon   RHINOPLASTY  5/10  and septoplasty   SUBACROMIAL DECOMPRESSION Right 2005   Arthroscopic (for rotator cuff and biceps tendon ruptures)   TEAR DUCT PROBING  11/13   Dr Michael Colon   TOTAL KNEE ARTHROPLASTY Left 09/01/2014   Procedure: TOTAL KNEE ARTHROPLASTY;  Surgeon: Michael Leep, MD;  Location: [facility];  Service: Orthopedics;  Laterality: Left;   TRIGGER FINGER RELEASE Left 02/25/2015   Procedure: LEFT LONG TRIGGER RELEASE;  Surgeon: Michael Leep, MD;  Location: [facility];  Service: Orthopedics;  Laterality: Left;   VENOUS ABLATION      Family History  Problem Relation Age of [place]   Colon cancer Father    Diabetes Father    Hypertension Father    Other Mother        natural causes   Heart attack Neg  Hx    Stroke Neg Hx     Social History   Socioeconomic History   Marital status: Widowed    Spouse name: Not on file   Number of children: 2   Years of education: Not on file   Highest education level: Not on file  Occupational History   Occupation: Theme park manager    Comment: Retired--[employer]  Tobacco Use   Smoking status: Former    Packs/day: 1.00    Years: 0.00    Pack years: 0.00    Types: Cigarettes    Quit date: 01/03/1969    Years since quitting: 51.9   Smokeless tobacco: Never  Vaping Use   Vaping Use: Never used  Substance and Sexual Activity   Alcohol use: No    Alcohol/week: 1.0 standard drink    Types: 1 Glasses of wine per week    Comment: rare wine. 1-2x/yr.   Drug use: No   Sexual activity: Not on file  Other Topics Concern   Not on file  Social History Narrative   Has living will   Health care POA-- son Michael Colon   Has DNR order from the past---form redone 06/25/10   Probably no feeding tube if cognitively unaware   Social Determinants of Health   Financial Resource Strain: Not on file  Food Insecurity: Not on file  Transportation Needs: Not on file  Physical Activity: Not on file  Stress: Not on file  Social Connections: Not on file  Intimate Partner Violence: Not on file    Outpatient Medications Prior to Visit  Medication Sig Dispense Refill   APPLE CIDER VINEGAR PO Take by mouth.     atorvastatin (LIPITOR) 80 MG tablet Take 1 tablet (80 mg total) by mouth daily. 90 tablet 3   cetirizine (ZYRTEC) 10 MG tablet Take 10 mg by mouth daily.     Cyanocobalamin (B-12) 2500 MCG SUBL Place under the tongue.     docusate sodium (COLACE) 100 MG capsule Take 100 mg by mouth 2 (two) times daily.     famotidine (PEPCID) 20 MG tablet Take 20 mg by mouth daily.     finasteride (PROSCAR) 5 MG tablet TAKE 1 TABLET BY MOUTH ONCE DAILY 90 tablet 3   furosemide (LASIX) 20 MG tablet Take 1 tablet (20 mg total) by mouth daily as needed (Extra pill as needed for weight 215  or higher). Once daily and extra pill as needed for weight 215 or higher. 180 tablet 0   glucose blood (BAYER CONTOUR TEST) test strip USE STRIP TO TEST BLOOD SUGAR ONCE DAILY. Dx Code: E11.40 100 each 3   ibuprofen (ADVIL,MOTRIN) 200 MG tablet Take 400 mg by mouth  2 (two) times daily.      loratadine (CLARITIN) 10 MG tablet Take 10 mg by mouth daily.     metFORMIN (GLUCOPHAGE) 500 MG tablet TAKE 1 TABLET BY MOUTH  TWICE DAILY WITH MEALS 180 tablet 3   Microlet Lancets MISC Use to test twice daily or as directed 200 each 3   tamsulosin (FLOMAX) 0.4 MG CAPS capsule TAKE 1 CAPSULE BY MOUTH  DAILY 90 capsule 3   triamterene-hydrochlorothiazide (DYAZIDE) 37.5-25 MG capsule Take 1 each (1 capsule total) by mouth every morning. 90 capsule 3   warfarin (COUMADIN) 5 MG tablet TAKE 1 TO 1 AND 1/2 TABLETS BY MOUTH DAILY AS DIRECTED  BY COUMADIN CLINIC 135 tablet 0   No facility-administered medications prior to visit.    Allergies  Allergen Reactions   Enoxaparin Hives   Enoxaparin Sodium Hives   Latex Rash    Has trouble with BANDAIDS that have been left on for more than 24 hours. Prefers paper tape.   Nickel Rash   Percocet [Oxycodone-Acetaminophen] Rash   Tape Rash and Other (See Comments)    Sensitivity     Review of Systems  Constitutional:  Positive for chills. Negative for fever.  Gastrointestinal:  Positive for constipation (unable to have a complete bowel movement without pain on straining for last five days). Negative for abdominal pain, blood in stool, diarrhea, melena, nausea and vomiting.  Genitourinary:  Negative for dysuria (foul urinary odor), frequency and urgency.       Swollen right testicle for the last 5 days increasing in size.  Worse with standing up.  Relieved with sitting.  Very tender  Skin:  Positive for rash (funal infection toenails pt requesting medication).      Objective:    Physical Exam Constitutional:      General: He is not in acute distress.     Appearance: Normal appearance. He is not ill-appearing.  Abdominal:     Hernia: There is no hernia in the right inguinal area.  Genitourinary:    Penis: Normal.      Testes:        Right: Tenderness and swelling present. Cremasteric reflex is present.         Left: Tenderness or swelling not present. Cremasteric reflex is present.      Epididymis:     Right: Inflamed. Tenderness present.     Left: Normal.    Feet:     Right foot:     Toenail Condition: Fungal disease present.    Left foot:     Toenail Condition: Fungal disease present. Neurological:     Mental Status: He is alert.    BP 122/64   Pulse (!) 56   Temp 97.9 F (36.6 C) (Temporal)   Ht 5\' 11"  (1.803 m)   Wt 206 lb (93.4 kg)   SpO2 96%   BMI 28.73 kg/m  Wt Readings from Last 3 Encounters:  11/30/20 206 lb (93.4 kg)  09/23/20 208 lb (94.3 kg)  08/24/20 205 lb (93 kg)       Assessment & Plan:   Problem List Items Addressed This Visit       Musculoskeletal and Integument   Onychomycosis    Patient given prescription to preferred pharmacy.  Patient to take as directed and start.  If no improvement will refer to dermatology.      Relevant Medications   ciclopirox (PENLAC) 8 % solution     Genitourinary   Orchitis of right testicle -  Primary    Suspected orchitis, advised pt to start antbx which has been sent to preferred pharmacy. If any worsening chlils/fever/pain pt advised to go to the ER immediately. Pt referred to urology for a stat referral as well. U/s scrotum with doppler also ordered stat, pending results as well as cbc and urinalysis.        Other   Right testicular pain    Stat scrotal ultrasound ordered pending results.  Patient aware that if any worsening pain to go to emergency room immediately.      Relevant Medications   doxycycline (VIBRA-TABS) 100 MG tablet   Other Relevant Orders   Urinalysis, Routine w reflex microscopic   Ambulatory referral to Urology   CBC w/Diff   US  SCROTUM W/DOPPLER   Inguinal tenderness   Other Visit Diagnoses     Chills (without fever)           I am having Javoni R. Lindor "Joneen Caraway" start on ciclopirox. I am also having him maintain his docusate sodium, famotidine, loratadine, ibuprofen, cetirizine, Microlet Lancets, glucose blood, B-12, APPLE CIDER VINEGAR PO, furosemide, warfarin, triamterene-hydrochlorothiazide, atorvastatin, metFORMIN, tamsulosin, finasteride, and doxycycline.  Meds ordered this encounter  Medications   DISCONTD: doxycycline (VIBRA-TABS) 100 MG tablet    Sig: Take 1 tablet (100 mg total) by mouth 2 (two) times daily for 10 days.    Dispense:  20 tablet    Refill:  0    Order Specific Question:   Supervising Provider    Answer:   BEDSOLE, AMY E [[id number]]   doxycycline (VIBRA-TABS) 100 MG tablet    Sig: Take 1 tablet (100 mg total) by mouth 2 (two) times daily for 10 days.    Dispense:  20 tablet    Refill:  0    Order Specific Question:   Supervising Provider    Answer:   BEDSOLE, AMY E [[id number]]   ciclopirox (PENLAC) 8 % solution    Sig: Apply topically at bedtime. Apply over nail and surrounding skin. Apply daily over previous coat. After seven (7) days, may remove with alcohol and continue cycle.    Dispense:  6.6 mL    Refill:  0    Order Specific Question:   Supervising Provider    Answer:   BEDSOLE, AMY E [[id number]]

## 2020-11-30 NOTE — Assessment & Plan Note (Signed)
Patient given prescription to preferred pharmacy.  Patient to take as directed and start.  If no improvement will refer to dermatology.

## 2020-12-01 ENCOUNTER — Ambulatory Visit
Admission: RE | Admit: 2020-12-01 | Discharge: 2020-12-01 | Disposition: A | Discharge status: Home / Self Care | Payer: Medicare Other | Source: Ambulatory Visit | Attending: Family | Admitting: Family

## 2020-12-01 ENCOUNTER — Other Ambulatory Visit
Admission: RE | Admit: 2020-12-01 | Discharge: 2020-12-01 | Disposition: A | Discharge status: Home / Self Care | Payer: Medicare Other | Attending: Urology | Admitting: Urology

## 2020-12-01 ENCOUNTER — Ambulatory Visit (INDEPENDENT_AMBULATORY_CARE_PROVIDER_SITE_OTHER): Payer: Medicare Other | Admitting: Urology

## 2020-12-01 ENCOUNTER — Encounter: Payer: Self-pay | Admitting: Urology

## 2020-12-01 ENCOUNTER — Other Ambulatory Visit: Payer: Self-pay | Admitting: *Deleted

## 2020-12-01 ENCOUNTER — Telehealth: Payer: Self-pay | Admitting: Family

## 2020-12-01 VITALS — BP 172/88 | HR 71 | Ht 71.0 in | Wt 204.0 lb

## 2020-12-01 DIAGNOSIS — N50811 Right testicular pain: Secondary | ICD-10-CM | POA: Insufficient documentation

## 2020-12-01 DIAGNOSIS — N453 Epididymo-orchitis: Secondary | ICD-10-CM

## 2020-12-01 LAB — URINALYSIS, ROUTINE W REFLEX MICROSCOPIC
Bilirubin Urine: NEGATIVE
Ketones, ur: NEGATIVE
Nitrite: POSITIVE — AB
Specific Gravity, Urine: 1.02 (ref 1.000–1.030)
Total Protein, Urine: NEGATIVE
Urine Glucose: NEGATIVE
Urobilinogen, UA: 2 — AB (ref 0.0–1.0)
pH: 5.5 (ref 5.0–8.0)

## 2020-12-01 NOTE — Addendum Note (Signed)
Addended by: Despina Hidden on: 12/01/2020 02:54 PM   Modules accepted: Orders

## 2020-12-01 NOTE — Patient Instructions (Signed)
Epididymitis Epididymitis is inflammation or swelling of the epididymis. This is caused by an infection. The epididymis is a cord-like structure that is located along the top and back part of the testicle. It collects and stores sperm from the testicle. This condition can also cause pain and swelling of the testicle and scrotum. Symptoms usually start suddenly (acute epididymitis). Sometimes epididymitis starts gradually and lasts for a while (chronic epididymitis). Chronic epididymitis may be harder to treat. What are the causes? In men ages 27-40, this condition is usually caused by a bacterial infection or a sexually transmitted infection (STI), such as gonorrhea or chlamydia. In men 35 and older, this condition is usually caused by bacteria from a urinary blockage or from abnormalities in the urinary system. These can result from: Having a tube placed into the bladder (urinary catheter). Having an enlarged or inflamed prostate gland. Having recently had urinary tract surgery. Having a problem with a backward flow of urine (retrograde). In men who have a condition that weakens the body's defense system (immune system), such as human immunodeficiency virus (HIV), this condition can be caused by: Other bacteria, including tuberculosis and syphilis. Viruses. Fungi. Sometimes this condition occurs without infection. This may happen because of trauma or repetitive activities such as sports. What increases the risk? You are more likely to develop this condition if you have: Unprotected sex with more than one partner. Anal sex. Had recent surgery. A urinary catheter. Urinary problems. A suppressed immune system. What are the signs or symptoms? This condition usually begins suddenly with chills, fever, and pain behind the scrotum and in the testicle. Other symptoms include: Swelling of the scrotum, testicle, or both. Pain when ejaculating or urinating. Pain in the back or  abdomen. Nausea. Itching and discharge from the penis. A frequent need to pass urine. Redness, increased warmth, and tenderness of the scrotum. How is this diagnosed? Your health care provider can diagnose this condition based on your symptoms and medical history. Your health care provider will also do a physical exam to check your scrotum and testicle for swelling, pain, and redness. You may also have other tests, including: Testing of discharge from the penis. Testing your urine for infections, such as STIs. Ultrasound to check for blood flow and inflammation. Your health care provider may test you for other STIs, including HIV. How is this treated? Treatment for this condition depends on the cause. If your condition is caused by a bacterial infection, oral antibiotic medicine may be prescribed. If the bacterial infection has spread to your blood, you may need to receive IV antibiotics. For both bacterial and nonbacterial epididymitis, you may be treated with: Rest. Elevation of the scrotum. Pain medicines. Anti-inflammatory medicines. Surgery may be needed if: You have pus buildup in the scrotum (abscess). You have epididymitis that has not responded to other treatments. Follow these instructions at home: Medicines Take over-the-counter and prescription medicines only as told by your health care provider. If you were prescribed an antibiotic medicine, take it as told by your health care provider. Do not stop taking the antibiotic even if your condition improves. Sexual activity If your epididymitis was caused by an STI, avoid sexual activity until your treatment is complete. Inform your sexual partner or partners if you test positive for an STI. They may need to be treated. Do not engage in sexual activity with your partner or partners until their treatment is completed. Managing pain and swelling  If directed, raise (elevate) your scrotum and apply ice. To  do this: Put ice in a  plastic bag. Place a small towel or pillow between your legs. Rest your scrotum on the pillow or towel. Place another towel between your skin and the plastic bag. Leave the ice on for 20 minutes, 2-3 times a day. Remove the ice if your skin turns bright red. This is very important. If you cannot feel pain, heat, or cold, you have a greater risk of damage to the area. Keep your scrotum elevated and supported while resting. Ask your health care provider if you should wear a scrotal support, such as a jockstrap. Wear it as told by your health care provider. Try taking a sitz bath to help with discomfort. This is a warm water bath that is taken while you are sitting down. The water should come up to your hips and should cover your buttocks. Do this 3-4 times per day or as told by your health care provider. General instructions Drink enough fluid to keep your urine pale yellow. Return to your normal activities as told by your health care provider. Ask your health care provider what activities are safe for you. Keep all follow-up visits. This is important. Contact a health care provider if: You have a fever. Your pain medicine is not helping. Your pain is getting worse. Your symptoms do not improve within 3 days. Summary Epididymitis is inflammation or swelling of the epididymis. This is caused by an infection. This condition can also cause pain and swelling of the testicle and scrotum. Treatment for this condition depends on the cause. If your condition is caused by a bacterial infection, oral antibiotic medicine may be prescribed. Inform your sexual partner or partners if you test positive for an STI. They may need to be treated. Do not engage in sexual activity with your partner or partners until their treatment is completed. Contact a health care provider if your symptoms do not improve within 3 days. This information is not intended to replace advice given to you by your health care provider.  Make sure you discuss any questions you have with your health care provider. Document Revised: 07/29/2020 Document Reviewed: 07/29/2020 Elsevier Patient Education  [place].

## 2020-12-01 NOTE — Telephone Encounter (Signed)
Received referral note from urologist as patient was seen in his office today.  Urologist encouraged him to start the doxycycline antibiotics that were prescribed by me as well as reviewed the findings of the u/a and the ultrasound with pt.

## 2020-12-01 NOTE — Progress Notes (Signed)
12/01/20 2:05 PM   [name] 1930-04-03 [id number]  CC: Right scrotal swelling  HPI: 87 year old comorbid male who reports of week of right-sided scrotal pain and swelling.  He was seen by PCP yesterday and diagnosed with suspected epididymitis and started on doxycycline, he has not yet started that prescription.  They also ordered a stat scrotal ultrasound which was performed today and showed mild right epididymitis but no other suspicious findings.  He denies any fevers.  He has not had any recent UTIs.  He denies significant urinary symptoms, and is on Flomax and finasteride long-term.  He is on Coumadin.  He denies any gross hematuria.  Urinalysis yesterday suspicious for infection with large leukocytes, nitrite positive, greater than 50 WBCs, 0-2 RBCs, many bacteria.   PMH: Past Medical History:  Diagnosis Date   Asthma    Atrial flutter ([place]) 2007   Band keratopathy    Benign prostatic hypertrophy    Chronic ear infection    Chronic kidney disease    Chronic prostatitis    Dental bridge present    permanent - upper   Diabetes mellitus    Elbow stiffness, left    s/p fracture many yrs ago.  arm does not straighten   GERD (gastroesophageal reflux disease)    laryngeal involvement   Hyperlipidemia    Hypertension    Obstructive sleep apnea    CPAP-9   Osteoarthrosis, localized, primary, knee    post-traumatic   Prostatitis    Stroke ([place])    TIA   Tachy-brady syndrome ([place])    UTI (urinary tract infection)     Surgical History: Past Surgical History:  Procedure Laterality Date   APPENDECTOMY     BELPHAROPTOSIS REPAIR     Dr [name]---didn't resolve weepy eye and eyelid drooping   CARDIOVERSION  04/13/2010   CATARACT EXTRACTION, BILATERAL  2009   Chest pain  8/12   Stress test benign   ESOPHAGEAL DILATION  05/26/2016   Procedure: ESOPHAGEAL DILATION;  Surgeon: Lucilla Lame, MD;  Location: [facility];  Service: Endoscopy;;    ESOPHAGOGASTRODUODENOSCOPY (EGD) WITH PROPOFOL N/A 05/26/2016   Procedure: ESOPHAGOGASTRODUODENOSCOPY (EGD) WITH PROPOFOL;  Surgeon: Lucilla Lame, MD;  Location: [facility];  Service: Endoscopy;  Laterality: N/A;  Diabetic - oral meds sleep apnea   EYE SURGERY     FRACTURE SURGERY Left    elbow   KNEE SURGERY  1998   plate after fracture, then removed for infection   left elbow surgery     MASTOIDECTOMY  8/08   Dr Idelle Crouch   RHINOPLASTY  5/10   and septoplasty   SUBACROMIAL DECOMPRESSION Right 2005   Arthroscopic (for rotator cuff and biceps tendon ruptures)   TEAR DUCT PROBING  11/13   Dr Vickki Muff   TOTAL KNEE ARTHROPLASTY Left 09/01/2014   Procedure: TOTAL KNEE ARTHROPLASTY;  Surgeon: Dereck Leep, MD;  Location: [facility];  Service: Orthopedics;  Laterality: Left;   TRIGGER FINGER RELEASE Left 02/25/2015   Procedure: LEFT LONG TRIGGER RELEASE;  Surgeon: Dereck Leep, MD;  Location: [facility];  Service: Orthopedics;  Laterality: Left;   VENOUS ABLATION     Family History: Family History  Problem Relation Age of Onset   Colon cancer Father    Diabetes Father    Hypertension Father    Other Mother        natural causes   Heart attack Neg Hx    Stroke Neg Hx     Social  History:  reports that he quit smoking about 51 years ago. His smoking use included cigarettes. He smoked an average of 1.00 packs per day. He has never used smokeless tobacco. He reports that he does not drink alcohol and does not use drugs.  Physical Exam: BP (!) 172/88   Pulse 71   Ht 5\' 11"  (1.803 m)   Wt 204 lb (92.5 kg)   BMI 28.45 kg/m    Constitutional:  Alert and oriented, No acute distress. Cardiovascular: No clubbing, cyanosis, or edema. Respiratory: Normal respiratory effort, no increased work of breathing. GI: Abdomen is soft, nontender, nondistended, no abdominal masses GU: Phallus with patent meatus, right testicle tender and edematous consistent with right  epididymitis  Laboratory Data: Reviewed  Pertinent Imaging: I have personally viewed and interpreted the scrotal ultrasound today that shows  right epididymitis.  Assessment & Plan:   87 year old male with right epididymitis, has not yet started antibiotics.  I recommended snug fitting underwear, NSAIDs, icing, and starting the doxycycline that was prescribed.  Levaquin not a good option with his Coumadin.  Return precautions discussed extensively, and counseled him that it may take a few weeks for the swelling and tenderness to completely resolve.  Doxycycline twice daily x10 days as prescribed RTC 3 months PVR and symptom check   Nickolas Madrid, MD 12/01/2020  [facility] [address], [place], [place] [address] [phone]

## 2020-12-16 ENCOUNTER — Ambulatory Visit: Payer: Medicare Other

## 2020-12-16 ENCOUNTER — Other Ambulatory Visit: Payer: Self-pay

## 2020-12-16 DIAGNOSIS — Z5181 Encounter for therapeutic drug level monitoring: Secondary | ICD-10-CM | POA: Diagnosis not present

## 2020-12-16 DIAGNOSIS — I4892 Unspecified atrial flutter: Secondary | ICD-10-CM

## 2020-12-16 LAB — POCT INR: INR: 2.7 (ref 2.0–3.0)

## 2020-12-16 NOTE — Patient Instructions (Signed)
-   continue warfarin dosage of 1 tablets daily except 1.5 tablet on SUNDAYS, TUESDAYS & THURSDAYS.   - Recheck in 6 weeks.   Please be consistent with your green intake - please pick a day or days each week to have your greens and have them every week on that day (your pills are on a schedule, so your greens need to be on one)

## 2021-01-03 ENCOUNTER — Other Ambulatory Visit: Payer: Self-pay | Admitting: Cardiovascular Disease

## 2021-01-05 NOTE — Telephone Encounter (Signed)
Refill request

## 2021-01-05 NOTE — Telephone Encounter (Signed)
Warfarin refill:  LOV 09/23/20  Johnny Bridge MD Last INR 2.7 on 12/16/20 Refill approved.

## 2021-01-27 ENCOUNTER — Other Ambulatory Visit: Payer: Self-pay

## 2021-01-27 ENCOUNTER — Ambulatory Visit (INDEPENDENT_AMBULATORY_CARE_PROVIDER_SITE_OTHER): Payer: Medicare Other

## 2021-01-27 DIAGNOSIS — I4892 Unspecified atrial flutter: Secondary | ICD-10-CM

## 2021-01-27 DIAGNOSIS — Z5181 Encounter for therapeutic drug level monitoring: Secondary | ICD-10-CM

## 2021-01-27 LAB — POCT INR: INR: 1.9 — AB (ref 2.0–3.0)

## 2021-01-27 NOTE — Patient Instructions (Signed)
-   take extra 1/2 tablet tonight, then - continue warfarin dosage of 1 tablets daily except 1.5 tablet on SUNDAYS, TUESDAYS & THURSDAYS.   - Recheck in 6 weeks.

## 2021-01-28 ENCOUNTER — Telehealth: Payer: Self-pay

## 2021-01-28 ENCOUNTER — Telehealth: Payer: Self-pay | Admitting: Internal Medicine

## 2021-01-28 NOTE — Telephone Encounter (Signed)
Pt called stating that he will be going [facility]. Pt states that [facility] stated that  Dr Silvio Pate was not in network because of his location. Pt is asking for a call back to discuss. Please advise.

## 2021-01-28 NOTE — Telephone Encounter (Signed)
[facility] Night - Client Nonclinical Telephone Record  [facility] Client [place] Night - Client Client [facility] - Night Provider [name]- MD Contact Type Call Who Is Calling Patient / Member / Family / Caregiver Caller Name Kevil Phone Number [phone] Patient Name Michael Colon Patient DOB 04/05/1930 Call Type Message Only Information Provided Reason for Call Request for General Office Information Initial Comment Caller states to confirm insurance information. His insurance is telling him that his doctor is not in the network. Additional Comment He states he needs a call back ASAP tomorrow so he has time to contact [employer]. Office hours provided. Disp. Time Disposition Final User 01/27/2021 5:02:39 PM General Information Provided Yes Arma Heading Call Closed By: Arma Heading Transaction Date/Time: 01/27/2021 4:58:32 PM (ET

## 2021-01-28 NOTE — Telephone Encounter (Signed)
Lm to return call.

## 2021-01-28 NOTE — Telephone Encounter (Signed)
This is more of a question for Amy or Mandy.I will forward it to them.

## 2021-02-10 ENCOUNTER — Other Ambulatory Visit: Payer: Self-pay

## 2021-02-10 ENCOUNTER — Encounter: Payer: Self-pay | Admitting: Urology

## 2021-02-10 ENCOUNTER — Ambulatory Visit: Payer: Medicare Other | Admitting: Urology

## 2021-02-10 VITALS — BP 133/71 | HR 69 | Ht 71.0 in | Wt 199.0 lb

## 2021-02-10 DIAGNOSIS — N138 Other obstructive and reflux uropathy: Secondary | ICD-10-CM | POA: Diagnosis not present

## 2021-02-10 DIAGNOSIS — N401 Enlarged prostate with lower urinary tract symptoms: Secondary | ICD-10-CM

## 2021-02-10 DIAGNOSIS — N453 Epididymo-orchitis: Secondary | ICD-10-CM | POA: Diagnosis not present

## 2021-02-10 DIAGNOSIS — N50811 Right testicular pain: Secondary | ICD-10-CM

## 2021-02-10 LAB — BLADDER SCAN AMB NON-IMAGING

## 2021-02-10 NOTE — Progress Notes (Signed)
° °  02/10/2021 1:08 PM   Michael Colon [birth year] [id number]  Reason for visit: Follow up epididymitis, BPH  HPI: 88 year old comorbid male who I saw in November 2022 for epididymitis that was treated with doxycycline.  He also has a long history of BPH on maximal medical therapy with Flomax and finasteride, and history of recurrent prostatitis in the distant past.  His epididymitis has almost completely resolved and he only has some minimal residual soreness at this time.  His primary urinary complaints are some urinary dribbling and nocturia 1 time per night.  PVR today is borderline at 148 mL.  We discussed options including cystoscopy/TRUS for further evaluation of BPH and consideration of an outlet procedure, versus continuing maximal medical therapy with Flomax and finasteride.  He would like to continue with the medical therapy at this time, but understands that recurrent infections or worsening urinary symptoms would warrant further investigation with cystoscopy/TRUS.  RTC 9 months PVR   [place], [place] [place] [address] [place], [place] [address] [phone]

## 2021-02-10 NOTE — Patient Instructions (Signed)

## 2021-02-25 ENCOUNTER — Ambulatory Visit (INDEPENDENT_AMBULATORY_CARE_PROVIDER_SITE_OTHER): Payer: Medicare Other | Admitting: Internal Medicine

## 2021-02-25 ENCOUNTER — Other Ambulatory Visit: Payer: Self-pay

## 2021-02-25 ENCOUNTER — Encounter: Payer: Self-pay | Admitting: Internal Medicine

## 2021-02-25 VITALS — BP 110/70 | HR 64 | Temp 97.7°F | Ht 71.0 in | Wt 196.0 lb

## 2021-02-25 DIAGNOSIS — Z0000 Encounter for general adult medical examination without abnormal findings: Secondary | ICD-10-CM | POA: Diagnosis not present

## 2021-02-25 DIAGNOSIS — I4892 Unspecified atrial flutter: Secondary | ICD-10-CM

## 2021-02-25 DIAGNOSIS — E1140 Type 2 diabetes mellitus with diabetic neuropathy, unspecified: Secondary | ICD-10-CM | POA: Diagnosis not present

## 2021-02-25 DIAGNOSIS — I700 Atherosclerosis of aorta: Secondary | ICD-10-CM | POA: Diagnosis not present

## 2021-02-25 DIAGNOSIS — K219 Gastro-esophageal reflux disease without esophagitis: Secondary | ICD-10-CM | POA: Diagnosis not present

## 2021-02-25 DIAGNOSIS — D696 Thrombocytopenia, unspecified: Secondary | ICD-10-CM

## 2021-02-25 DIAGNOSIS — I5032 Chronic diastolic (congestive) heart failure: Secondary | ICD-10-CM | POA: Diagnosis not present

## 2021-02-25 DIAGNOSIS — I10 Essential (primary) hypertension: Secondary | ICD-10-CM

## 2021-02-25 LAB — HM DIABETES FOOT EXAM

## 2021-02-25 NOTE — Assessment & Plan Note (Signed)
On imaging  Is on atorvastatin 80mg 

## 2021-02-25 NOTE — Progress Notes (Signed)
Subjective:    Patient ID: Michael Colon, male    DOB: 1930/03/14, 88 y.o.   MRN: [id number]  HPI Here for Medicare wellness visit and follow up of chronic health conditions Reviewed advanced directives Reviewed other doctors---Dr Gollan--cardiology, Dr Myrtice Lauth, Dr Sninsky--urology, Dr Lenis Noon, Dr Dingledein, Dr Quintella Reichert No hospitalizations or surgery in the past year No alcohol or tobacco Did fall once going into choir loft at church--no injury Vision is okay Has hearing aides--but they don't help much No depression or anhedonia Exercises regularly Independent with instrumental ADLs No memory problems  Orchitis/epididymitis is improved Still some swelling but is better  Going to [facility] in [place] about his hand/arm More trouble extending left 5th finger Fused left elbow from past ulnar damage Can't type with it---trouble with other activities  Checks sugars once a week or so Usually under 120  Still has numbness in feet but not much pain. Notices trouble with balance due to this (has walking sticks but doesn't always use)  No palpitations No chest pain or SOB Mild edema---usually gone in the morning (and can't wear support hose) No dizziness or syncope  Voids okay Some dribbling still--going back to urologist  Uses the famotidine daily No heartburn or dysphagia  Still bruises easily Platelets still low---last 131K  Current Outpatient Medications on File Prior to Visit  Medication Sig Dispense Refill   APPLE CIDER VINEGAR PO Take by mouth.     atorvastatin (LIPITOR) 80 MG tablet Take 1 tablet (80 mg total) by mouth daily. 90 tablet 3   cetirizine (ZYRTEC) 10 MG tablet Take 10 mg by mouth daily.     ciclopirox (PENLAC) 8 % solution Apply topically at bedtime. Apply over nail and surrounding skin. Apply daily over previous coat. After seven (7) days, may remove with alcohol and continue cycle. 6.6 mL 0   Cyanocobalamin (B-12) 2500  MCG SUBL Place under the tongue.     docusate sodium (COLACE) 100 MG capsule Take 100 mg by mouth 2 (two) times daily.     famotidine (PEPCID) 20 MG tablet Take 20 mg by mouth daily.     finasteride (PROSCAR) 5 MG tablet TAKE 1 TABLET BY MOUTH ONCE DAILY 90 tablet 3   glucose blood (BAYER CONTOUR TEST) test strip USE STRIP TO TEST BLOOD SUGAR ONCE DAILY. Dx Code: E11.40 100 each 3   ibuprofen (ADVIL,MOTRIN) 200 MG tablet Take 400 mg by mouth 2 (two) times daily.      loratadine (CLARITIN) 10 MG tablet Take 10 mg by mouth daily.     metFORMIN (GLUCOPHAGE) 500 MG tablet TAKE 1 TABLET BY MOUTH  TWICE DAILY WITH MEALS (Patient taking differently: Take 500 mg by mouth daily at 2 PM.) 180 tablet 3   Microlet Lancets MISC Use to test twice daily or as directed 200 each 3   tamsulosin (FLOMAX) 0.4 MG CAPS capsule TAKE 1 CAPSULE BY MOUTH  DAILY 90 capsule 3   triamterene-hydrochlorothiazide (DYAZIDE) 37.5-25 MG capsule Take 1 each (1 capsule total) by mouth every morning. 90 capsule 3   warfarin (COUMADIN) 5 MG tablet TAKE 1 TO 1 AND 1/2 TABLETS BY MOUTH DAILY AS DIRECTED  BY COUMADIN CLINIC 135 tablet 3   furosemide (LASIX) 20 MG tablet Take 1 tablet (20 mg total) by mouth daily as needed (Extra pill as needed for weight 215 or higher). Once daily and extra pill as needed for weight 215 or higher. 180 tablet 0   No current facility-administered medications on  file prior to visit.    Allergies  Allergen Reactions   Enoxaparin Hives   Enoxaparin Sodium Hives   Latex Rash    Has trouble with BANDAIDS that have been left on for more than 24 hours. Prefers paper tape.   Nickel Rash   Percocet [Oxycodone-Acetaminophen] Rash   Tape Rash and Other (See Comments)    Sensitivity     Past Medical History:  Diagnosis Date   Asthma    Atrial flutter ([place]) 2007   Band keratopathy    Benign prostatic hypertrophy    Chronic ear infection    Chronic kidney disease    Chronic prostatitis    Dental bridge  present    permanent - upper   Diabetes mellitus    Elbow stiffness, left    s/p fracture many yrs ago.  arm does not straighten   GERD (gastroesophageal reflux disease)    laryngeal involvement   Hyperlipidemia    Hypertension    Obstructive sleep apnea    CPAP-9   Osteoarthrosis, localized, primary, knee    post-traumatic   Prostatitis    Stroke ([place])    TIA   Tachy-brady syndrome ([place])    UTI (urinary tract infection)     Past Surgical History:  Procedure Laterality Date   APPENDECTOMY     BELPHAROPTOSIS REPAIR     Dr [name]---didn't resolve weepy eye and eyelid drooping   CARDIOVERSION  04/13/2010   CATARACT EXTRACTION, BILATERAL  2009   Chest pain  8/12   Stress test benign   ESOPHAGEAL DILATION  05/26/2016   Procedure: ESOPHAGEAL DILATION;  Surgeon: Lucilla Lame, MD;  Location: [facility];  Service: Endoscopy;;   ESOPHAGOGASTRODUODENOSCOPY (EGD) WITH PROPOFOL N/A 05/26/2016   Procedure: ESOPHAGOGASTRODUODENOSCOPY (EGD) WITH PROPOFOL;  Surgeon: Lucilla Lame, MD;  Location: [facility];  Service: Endoscopy;  Laterality: N/A;  Diabetic - oral meds sleep apnea   EYE SURGERY     FRACTURE SURGERY Left    elbow   KNEE SURGERY  1998   plate after fracture, then removed for infection   left elbow surgery     MASTOIDECTOMY  8/08   Dr Idelle Crouch   RHINOPLASTY  5/10   and septoplasty   SUBACROMIAL DECOMPRESSION Right 2005   Arthroscopic (for rotator cuff and biceps tendon ruptures)   TEAR DUCT PROBING  11/13   Dr Vickki Muff   TOTAL KNEE ARTHROPLASTY Left 09/01/2014   Procedure: TOTAL KNEE ARTHROPLASTY;  Surgeon: Dereck Leep, MD;  Location: [facility];  Service: Orthopedics;  Laterality: Left;   TRIGGER FINGER RELEASE Left 02/25/2015   Procedure: LEFT LONG TRIGGER RELEASE;  Surgeon: Dereck Leep, MD;  Location: [facility];  Service: Orthopedics;  Laterality: Left;   VENOUS ABLATION      Family History  Problem Relation Age of Onset   Colon cancer Father     Diabetes Father    Hypertension Father    Other Mother        natural causes   Heart attack Neg Hx    Stroke Neg Hx     Social History   Socioeconomic History   Marital status: Widowed    Spouse name: Not on file   Number of children: 2   Years of education: Not on file   Highest education level: Not on file  Occupational History   Occupation: Pastor    Comment: Retired--[employer]  Tobacco Use   Smoking status: Former    Packs/day: 1.00  Years: 0.00    Pack years: 0.00    Types: Cigarettes    Quit date: 01/03/1969    Years since quitting: 52.1    Passive exposure: Never   Smokeless tobacco: Never  Vaping Use   Vaping Use: Never used  Substance and Sexual Activity   Alcohol use: No    Alcohol/week: 1.0 standard drink    Types: 1 Glasses of wine per week    Comment: rare wine. 1-2x/yr.   Drug use: No   Sexual activity: Not on file  Other Topics Concern   Not on file  Social History Narrative   Has living will   Health care POA-- son Shanon Brow   Has DNR order from the past---form redone 06/25/10   Probably no feeding tube if cognitively unaware   Social Determinants of Health   Financial Resource Strain: Not on file  Food Insecurity: Not on file  Transportation Needs: Not on file  Physical Activity: Not on file  Stress: Not on file  Social Connections: Not on file  Intimate Partner Violence: Not on file   Review of Systems Eating less Weight is stable Sleep is variable--can have delay in initiation some days (sleeps on sofa and no CPAP with his head up) Wears seat belt mostly Teeth okay---keeps up with dentist Bowels are regular. Occasional blood on toilet paper (known hemorrhoids)    Objective:   Physical Exam Constitutional:      Appearance: Normal appearance.  HENT:     Mouth/Throat:     Comments: No lesions Eyes:     Conjunctiva/sclera: Conjunctivae normal.     Pupils: Pupils are equal, round, and reactive to light.  Cardiovascular:     Rate and  Rhythm: Normal rate.     Heart sounds:    No gallop.     Comments: Slightly irregular Soft systolic murmur Faint pulse on left/absent right foot Pulmonary:     Effort: Pulmonary effort is normal.     Breath sounds: Normal breath sounds. No wheezing or rales.  Abdominal:     Palpations: Abdomen is soft.     Tenderness: There is no abdominal tenderness.  Musculoskeletal:     Cervical back: Neck supple.     Comments: 1+ non pitting edema left foot--trace on right  Lymphadenopathy:     Cervical: No cervical adenopathy.  Skin:    Findings: No lesion.     Comments: No foot lesions Some stasis changes and ecchymoses mostly on anterior calves  Neurological:     General: No focal deficit present.     Mental Status: He is alert and oriented to person, place, and time.     Comments: Mini-Cog---normal  Mildly decreased sensation in feet  Psychiatric:        Mood and Affect: Mood normal.        Behavior: Behavior normal.           Assessment & Plan:

## 2021-02-25 NOTE — Assessment & Plan Note (Signed)
Quiet on famotidine 20 mg daily

## 2021-02-25 NOTE — Progress Notes (Signed)
Hearing Screening - Comments:: Has hearing aids. Not wearing them today Vision Screening - Comments:: September 2022  

## 2021-02-25 NOTE — Assessment & Plan Note (Signed)
Easy bruising on warfarin Will recheck

## 2021-02-25 NOTE — Assessment & Plan Note (Signed)
Seems to be well controlled on metformin 500 bid Neuropathy affects balance--but not really painful

## 2021-02-25 NOTE — Assessment & Plan Note (Signed)
I have personally reviewed the Medicare Annual Wellness questionnaire and have noted 1. The patient's medical and social history 2. Their use of alcohol, tobacco or illicit drugs 3. Their current medications and supplements 4. The patient's functional ability including ADL's, fall risks, home safety risks and hearing or visual             impairment. 5. Diet and physical activities 6. Evidence for depression or mood disorders  The patients weight, height, BMI and visual acuity have been recorded in the chart I have made referrals, counseling and provided education to the patient based review of the above and I have provided the pt with a written personalized care plan for preventive services.  I have provided you with a copy of your personalized plan for preventive services. Please take the time to review along with your updated medication list.  No cancer screening due to age Had bivalent COVID vaccines Flu vaccine this fall Consider shingrix at the pharmacy---Td also Does exercise regularly

## 2021-02-25 NOTE — Assessment & Plan Note (Signed)
BP Readings from Last 3 Encounters:  02/25/21 110/70  02/10/21 133/71  12/01/20 (!) 172/88   Good control on dyazide 37.5/25

## 2021-02-25 NOTE — Assessment & Plan Note (Signed)
Irregular today May be atrial fib---but is already on coumadin (7.5 daily)

## 2021-02-25 NOTE — Assessment & Plan Note (Signed)
Compensated on furosemide 20mg  as needed

## 2021-02-26 DIAGNOSIS — M13832 Other specified arthritis, left wrist: Secondary | ICD-10-CM | POA: Diagnosis not present

## 2021-02-26 DIAGNOSIS — M19032 Primary osteoarthritis, left wrist: Secondary | ICD-10-CM | POA: Insufficient documentation

## 2021-02-26 DIAGNOSIS — S66819A Strain of other specified muscles, fascia and tendons at wrist and hand level, unspecified hand, initial encounter: Secondary | ICD-10-CM | POA: Insufficient documentation

## 2021-02-26 DIAGNOSIS — S66319A Strain of extensor muscle, fascia and tendon of unspecified finger at wrist and hand level, initial encounter: Secondary | ICD-10-CM | POA: Diagnosis not present

## 2021-02-26 LAB — COMPREHENSIVE METABOLIC PANEL
ALT: 11 U/L (ref 0–53)
AST: 15 U/L (ref 0–37)
Albumin: 3.8 g/dL (ref 3.5–5.2)
Alkaline Phosphatase: 66 U/L (ref 39–117)
BUN: 32 mg/dL — ABNORMAL HIGH (ref 6–23)
CO2: 36 mEq/L — ABNORMAL HIGH (ref 19–32)
Calcium: 11 mg/dL — ABNORMAL HIGH (ref 8.4–10.5)
Chloride: 102 mEq/L (ref 96–112)
Creatinine, Ser: 1.02 mg/dL (ref 0.40–1.50)
GFR: 65.72 mL/min (ref >60.00)
Glucose, Bld: 191 mg/dL — ABNORMAL HIGH (ref 70–99)
Potassium: 4.3 mEq/L (ref 3.5–5.1)
Sodium: 138 mEq/L (ref 135–145)
Total Bilirubin: 0.8 mg/dL (ref 0.2–1.2)
Total Protein: 6.9 g/dL (ref 6.0–8.3)

## 2021-02-26 LAB — CBC
HCT: 43.1 % (ref 39.0–52.0)
Hemoglobin: 14.4 g/dL (ref 13.0–17.0)
MCHC: 33.5 g/dL (ref 30.0–36.0)
MCV: 93.1 fl (ref 78.0–100.0)
Platelets: 148 10*3/uL — ABNORMAL LOW (ref 150.0–400.0)
RBC: 4.63 Mil/uL (ref 4.22–5.81)
RDW: 12.9 % (ref 11.5–15.5)
WBC: 6.5 10*3/uL (ref 4.0–10.5)

## 2021-02-26 LAB — LIPID PANEL
Cholesterol: 131 mg/dL (ref 0–200)
HDL: 70 mg/dL (ref >39.00)
LDL Cholesterol: 47 mg/dL (ref 0–99)
NonHDL: 60.52
Total CHOL/HDL Ratio: 2
Triglycerides: 67 mg/dL (ref 0.0–149.0)
VLDL: 13.4 mg/dL (ref 0.0–40.0)

## 2021-02-26 LAB — MICROALBUMIN / CREATININE URINE RATIO
Creatinine,U: 84.6 mg/dL
Microalb Creat Ratio: 2.3 mg/g (ref 0.0–30.0)
Microalb, Ur: 1.9 mg/dL (ref 0.0–1.9)

## 2021-02-26 LAB — HEMOGLOBIN A1C: Hgb A1c MFr Bld: 7.6 % — ABNORMAL HIGH (ref 4.6–6.5)

## 2021-03-10 ENCOUNTER — Ambulatory Visit: Payer: Medicare Other

## 2021-03-10 ENCOUNTER — Other Ambulatory Visit: Payer: Self-pay

## 2021-03-10 DIAGNOSIS — Z5181 Encounter for therapeutic drug level monitoring: Secondary | ICD-10-CM | POA: Diagnosis not present

## 2021-03-10 DIAGNOSIS — I4892 Unspecified atrial flutter: Secondary | ICD-10-CM | POA: Diagnosis not present

## 2021-03-10 LAB — POCT INR: INR: 2.4 (ref 2.0–3.0)

## 2021-03-10 NOTE — Patient Instructions (Signed)
-   continue warfarin dosage of 1 tablets daily except 1.5 tablet on SUNDAYS, TUESDAYS & THURSDAYS.   ?- Recheck in 6 weeks.  ? ?Please be consistent with your green intake - please pick a day or days each week to have your greens and have them every week on that day (your pills are on a schedule, so your greens need to be on one) ?

## 2021-03-16 DIAGNOSIS — E119 Type 2 diabetes mellitus without complications: Secondary | ICD-10-CM | POA: Diagnosis not present

## 2021-03-16 LAB — HM DIABETES EYE EXAM

## 2021-03-23 NOTE — Telephone Encounter (Signed)
Appears patient was seen on 03/17/21 and his insurance was verified with [facility] and Silvio Pate listed as the provider.  ? ? ?

## 2021-04-16 LAB — HM DIABETES EYE EXAM

## 2021-04-21 ENCOUNTER — Ambulatory Visit: Payer: Medicare Other

## 2021-04-21 DIAGNOSIS — L820 Inflamed seborrheic keratosis: Secondary | ICD-10-CM | POA: Diagnosis not present

## 2021-04-21 DIAGNOSIS — I4892 Unspecified atrial flutter: Secondary | ICD-10-CM | POA: Diagnosis not present

## 2021-04-21 DIAGNOSIS — X32XXXA Exposure to sunlight, initial encounter: Secondary | ICD-10-CM | POA: Diagnosis not present

## 2021-04-21 DIAGNOSIS — R208 Other disturbances of skin sensation: Secondary | ICD-10-CM | POA: Diagnosis not present

## 2021-04-21 DIAGNOSIS — D2272 Melanocytic nevi of left lower limb, including hip: Secondary | ICD-10-CM | POA: Diagnosis not present

## 2021-04-21 DIAGNOSIS — D2262 Melanocytic nevi of left upper limb, including shoulder: Secondary | ICD-10-CM | POA: Diagnosis not present

## 2021-04-21 DIAGNOSIS — D2261 Melanocytic nevi of right upper limb, including shoulder: Secondary | ICD-10-CM | POA: Diagnosis not present

## 2021-04-21 DIAGNOSIS — Z5181 Encounter for therapeutic drug level monitoring: Secondary | ICD-10-CM

## 2021-04-21 DIAGNOSIS — L570 Actinic keratosis: Secondary | ICD-10-CM | POA: Diagnosis not present

## 2021-04-21 DIAGNOSIS — Z85828 Personal history of other malignant neoplasm of skin: Secondary | ICD-10-CM | POA: Diagnosis not present

## 2021-04-21 DIAGNOSIS — L538 Other specified erythematous conditions: Secondary | ICD-10-CM | POA: Diagnosis not present

## 2021-04-21 LAB — POCT INR: INR: 2.7 (ref 2.0–3.0)

## 2021-04-21 NOTE — Patient Instructions (Signed)
-   continue warfarin dosage of 1 tablet daily except 1.5 tablet on SUNDAYS, TUESDAYS & THURSDAYS.   ?- Recheck in 6 weeks.  ? ?Please be consistent with your green intake - please pick a day or days each week to have your greens and have them every week on that day (your pills are on a schedule, so your greens need to be on one) ?

## 2021-04-28 ENCOUNTER — Ambulatory Visit
Admission: RE | Admit: 2021-04-28 | Discharge: 2021-04-28 | Disposition: A | Discharge status: Home / Self Care | Payer: Medicare Other | Source: Ambulatory Visit | Attending: Emergency Medicine | Admitting: Emergency Medicine

## 2021-04-28 VITALS — BP 152/77 | HR 60 | Temp 97.9°F | Resp 18

## 2021-04-28 DIAGNOSIS — H60501 Unspecified acute noninfective otitis externa, right ear: Secondary | ICD-10-CM | POA: Diagnosis not present

## 2021-04-28 MED ORDER — OFLOXACIN 0.3 % OT SOLN: 10.0000 [drp] | Freq: Every day | OTIC | 0 refills | Status: DC

## 2021-04-28 NOTE — ED Provider Notes (Signed)
?[facility] ? ? ? ?CSN: [id number] ?Arrival date & time: 04/28/21  5701 ? ? ?  ? ?History   ?Chief Complaint ?Chief Complaint  ?Patient presents with  ? Ear Drainage  ? ? ?HPI ?Michael Colon is a 88 y.o. male.  Patient presents with puslike drainage from his right ear canal x1 month.  Treatment attempted at home with peroxide ear washes.  He denies ear pain, change in hearing, fever, chills, sore throat, cough, or other symptoms.  His medical history includes chronic ear infection, diabetes, TIA, diastolic heart failure, hypertension, CKD. ? ?The history is provided by the patient and medical records.  ? ?Past Medical History:  ?Diagnosis Date  ? Asthma   ? Atrial flutter ([place]) 2007  ? Band keratopathy   ? Benign prostatic hypertrophy   ? Chronic ear infection   ? Chronic kidney disease   ? Chronic prostatitis   ? Dental bridge present   ? permanent - upper  ? Diabetes mellitus   ? Elbow stiffness, left   ? s/p fracture many yrs ago.  arm does not straighten  ? GERD (gastroesophageal reflux disease)   ? laryngeal involvement  ? Hyperlipidemia   ? Hypertension   ? Obstructive sleep apnea   ? CPAP-9  ? Osteoarthrosis, localized, primary, knee   ? post-traumatic  ? Prostatitis   ? Stroke [facility])   ? TIA  ? Tachy-brady syndrome ([place])   ? UTI (urinary tract infection)   ? ? ?Patient Active Problem List  ? Diagnosis Date Noted  ? Orchitis of right testicle 11/30/2020  ? Right testicular pain 11/30/2020  ? Inguinal tenderness 11/30/2020  ? Onychomycosis 11/30/2020  ? Shingles 05/18/2020  ? Hematoma 03/06/2020  ? Chronic diastolic heart failure ([place]) 10/25/2019  ? Hypercalcemia 10/25/2019  ? Venous stasis ulcer ([place]) 08/20/2019  ? Chondrocalcinosis 09/05/2018  ? Primary osteoarthritis of right knee 08/19/2018  ? Aortic atherosclerosis ([place]) 12/28/2016  ? Chronic venous insufficiency 04/12/2016  ? Thrombocytopenia ([place]) 06/25/2015  ? Obstructive sleep apnea 06/25/2015  ? Enlarged prostate without lower urinary tract  symptoms (luts) 10/14/2014  ? Advanced directives, counseling/discussion 12/13/2013  ? Encounter for therapeutic drug monitoring 01/30/2013  ? Routine general medical examination at a health care facility 12/11/2012  ? TIA (transient ischemic attack) 12/11/2012  ? Ventricular tachycardia-pause dependent 10/16/2012  ? Sinus bradycardia 09/07/2012  ? Bradycardia 09/07/2012  ? Edema 09/07/2012  ? Localized edema 09/07/2012  ? Mixed hyperlipidemia   ? Abnormal EKG 09/26/2011  ? Atrial flutter ([place]) 07/21/2010  ? Encounter for anticoagulation discussion and counseling 07/21/2010  ? Type 2 diabetes, controlled, with neuropathy ([place])   ? Tachy-brady syndrome ([place])   ? BPH with obstruction/lower urinary tract symptoms   ? Unspecified asthma(493.90)   ? GERD (gastroesophageal reflux disease)   ? Essential hypertension   ? ? ?Past Surgical History:  ?Procedure Laterality Date  ? APPENDECTOMY    ? [place]    ? Dr [name]---didn't resolve weepy eye and eyelid drooping  ? CARDIOVERSION  04/13/2010  ? CATARACT EXTRACTION, BILATERAL  2009  ? Chest pain  8/12  ? Stress test benign  ? ESOPHAGEAL DILATION  05/26/2016  ? Procedure: ESOPHAGEAL DILATION;  Surgeon: Lucilla Lame, MD;  Location: [facility];  Service: Endoscopy;;  ? ESOPHAGOGASTRODUODENOSCOPY (EGD) WITH PROPOFOL N/A 05/26/2016  ? Procedure: ESOPHAGOGASTRODUODENOSCOPY (EGD) WITH PROPOFOL;  Surgeon: Lucilla Lame, MD;  Location: [facility];  Service: Endoscopy;  Laterality: N/A;  Diabetic - oral meds ?  sleep apnea  ? EYE SURGERY    ? FRACTURE SURGERY Left   ? elbow  ? KNEE SURGERY  1998  ? plate after fracture, then removed for infection  ? left elbow surgery    ? MASTOIDECTOMY  8/08  ? Dr Idelle Crouch  ? RHINOPLASTY  5/10  ? and septoplasty  ? SUBACROMIAL DECOMPRESSION Right 2005  ? Arthroscopic (for rotator cuff and biceps tendon ruptures)  ? TEAR DUCT PROBING  11/13  ? Dr Vickki Muff  ? TOTAL KNEE ARTHROPLASTY Left 09/01/2014  ? Procedure: TOTAL KNEE  ARTHROPLASTY;  Surgeon: Dereck Leep, MD;  Location: [facility];  Service: Orthopedics;  Laterality: Left;  ? TRIGGER FINGER RELEASE Left 02/25/2015  ? Procedure: LEFT LONG TRIGGER RELEASE;  Surgeon: Dereck Leep, MD;  Location: [facility];  Service: Orthopedics;  Laterality: Left;  ? VENOUS ABLATION    ? ? ? ? ? ?Home Medications   ? ?Prior to Admission medications   ?Medication Sig Start Date End Date Taking? Authorizing Provider  ?ofloxacin (FLOXIN) 0.3 % OTIC solution Place 10 drops into the right ear daily. 04/28/21  Yes Sharion Balloon, NP  ?APPLE CIDER VINEGAR PO Take by mouth.    [provider]  ?atorvastatin (LIPITOR) 80 MG tablet Take 1 tablet (80 mg total) by mouth daily. 08/24/20   Venia Carbon, MD  ?cetirizine (ZYRTEC) 10 MG tablet Take 10 mg by mouth daily.    [provider]  ?ciclopirox (PENLAC) 8 % solution Apply topically at bedtime. Apply over nail and surrounding skin. Apply daily over previous coat. After seven (7) days, may remove with alcohol and continue cycle. 11/30/20   Eugenia Pancoast, FNP  ?Cyanocobalamin (B-12) 2500 MCG SUBL Place under the tongue.    [provider]  ?docusate sodium (COLACE) 100 MG capsule Take 100 mg by mouth 2 (two) times daily.    [provider]  ?famotidine (PEPCID) 20 MG tablet Take 20 mg by mouth daily.    [provider]  ?finasteride (PROSCAR) 5 MG tablet TAKE 1 TABLET BY MOUTH ONCE DAILY 09/22/20   Venia Carbon, MD  ?furosemide (LASIX) 20 MG tablet Take 1 tablet (20 mg total) by mouth daily as needed (Extra pill as needed for weight 215 or higher). Once daily and extra pill as needed for weight 215 or higher. 02/24/20 11/30/20  [name] I, MD  ?glucose blood (BAYER CONTOUR TEST) test strip USE STRIP TO TEST BLOOD SUGAR ONCE DAILY. Dx Code: E11.40 02/25/19   Venia Carbon, MD  ?ibuprofen (ADVIL,MOTRIN) 200 MG tablet Take 400 mg by mouth 2 (two) times daily.     [provider]  ?loratadine  (CLARITIN) 10 MG tablet Take 10 mg by mouth daily.    [provider]  ?metFORMIN (GLUCOPHAGE) 500 MG tablet TAKE 1 TABLET BY MOUTH  TWICE DAILY WITH MEALS ?Patient taking differently: Take 500 mg by mouth daily at 2 PM. 09/22/20   Venia Carbon, MD  ?Microlet Lancets MISC Use to test twice daily or as directed 02/15/19   [name] I, MD  ?tamsulosin (FLOMAX) 0.4 MG CAPS capsule TAKE 1 CAPSULE BY MOUTH  DAILY 09/22/20   Venia Carbon, MD  ?triamterene-hydrochlorothiazide (DYAZIDE) 37.5-25 MG capsule Take 1 each (1 capsule total) by mouth every morning. 08/24/20   Venia Carbon, MD  ?warfarin (COUMADIN) 5 MG tablet TAKE 1 TO 1 AND 1/2 TABLETS BY MOUTH DAILY AS DIRECTED  BY COUMADIN CLINIC 01/05/21  Minna Merritts, MD  ? ? ?Family History ?Family History  ?Problem Relation Age of Onset  ? Colon cancer Father   ? Diabetes Father   ? Hypertension Father   ? Other Mother   ?     natural causes  ? Heart attack Neg Hx   ? Stroke Neg Hx   ? ? ?Social History ?Social History  ? ?Tobacco Use  ? Smoking status: Former  ?  Packs/day: 1.00  ?  Years: 0.00  ?  Pack years: 0.00  ?  Types: Cigarettes  ?  Quit date: 01/03/1969  ?  Years since quitting: 52.3  ?  Passive exposure: Never  ? Smokeless tobacco: Never  ?Vaping Use  ? Vaping Use: Never used  ?Substance Use Topics  ? Alcohol use: No  ?  Alcohol/week: 1.0 standard drink  ?  Types: 1 Glasses of wine per week  ?  Comment: rare wine. 1-2x/yr.  ? Drug use: No  ? ? ? ?Allergies   ?Enoxaparin, Enoxaparin sodium, Latex, Nickel, Percocet [oxycodone-acetaminophen], and Tape ? ? ?Review of Systems ?Review of Systems  ?Constitutional:  Negative for chills and fever.  ?HENT:  Positive for ear discharge. Negative for ear pain, hearing loss and sore throat.   ?Respiratory:  Negative for cough and shortness of breath.   ?All other systems reviewed and are negative. ? ? ?Physical Exam ?Triage Vital Signs ?ED Triage Vitals  ?Enc Vitals Group  ?   BP 04/28/21 0918 (!)  152/77  ?   Pulse Rate 04/28/21 0918 60  ?   Resp 04/28/21 0918 18  ?   Temp 04/28/21 0918 97.9 ?F (36.6 ?C)  ?   Temp src --   ?   SpO2 04/28/21 0918 96 %  ?   Weight --   ?   Height --   ?   Head Circumferenc

## 2021-04-28 NOTE — ED Triage Notes (Signed)
Pt has right ear drainage x 1 month pt denies any pain.  ?

## 2021-04-28 NOTE — Discharge Instructions (Addendum)
Use the eardrops as directed.  Follow-up with your primary care provider or ENT if your symptoms are not improving. ?

## 2021-05-13 ENCOUNTER — Telehealth: Payer: Self-pay | Admitting: Internal Medicine

## 2021-05-13 NOTE — Telephone Encounter (Signed)
Pt called and said his podiatrist retired and he wanted a recommendation on who else Dr Silvio Pate would recommend . Callback is [phone]. He said you can call or send a mychart message ?

## 2021-05-13 NOTE — Telephone Encounter (Signed)
Spoke to pt. He will give them a call. ?

## 2021-05-25 ENCOUNTER — Ambulatory Visit: Payer: Medicare Other | Admitting: Podiatry

## 2021-05-25 DIAGNOSIS — M79674 Pain in right toe(s): Secondary | ICD-10-CM

## 2021-05-25 DIAGNOSIS — M79675 Pain in left toe(s): Secondary | ICD-10-CM | POA: Diagnosis not present

## 2021-05-25 DIAGNOSIS — B351 Tinea unguium: Secondary | ICD-10-CM | POA: Diagnosis not present

## 2021-05-25 NOTE — Progress Notes (Signed)
   SUBJECTIVE Patient presents to office today complaining of elongated, thickened nails that cause pain while ambulating in shoes.  Patient is unable to trim their own nails. Patient is here for further evaluation and treatment.  Past Medical History:  Diagnosis Date   Asthma    Atrial flutter ([place]) 2007   Band keratopathy    Benign prostatic hypertrophy    Chronic ear infection    Chronic kidney disease    Chronic prostatitis    Dental bridge present    permanent - upper   Diabetes mellitus    Elbow stiffness, left    s/p fracture many yrs ago.  arm does not straighten   GERD (gastroesophageal reflux disease)    laryngeal involvement   Hyperlipidemia    Hypertension    Obstructive sleep apnea    CPAP-9   Osteoarthrosis, localized, primary, knee    post-traumatic   Prostatitis    Stroke ([place])    TIA   Tachy-brady syndrome ([place])    UTI (urinary tract infection)     OBJECTIVE General Patient is awake, alert, and oriented x 3 and in no acute distress. Derm Skin is dry and supple bilateral. Negative open lesions or macerations. Remaining integument unremarkable. Nails are tender, long, thickened and dystrophic with subungual debris, consistent with onychomycosis, 1-5 bilateral. No signs of infection noted. Vasc  DP and PT pedal pulses palpable bilaterally. Temperature gradient within normal limits.  Neuro Epicritic and protective threshold sensation grossly intact bilaterally.  Musculoskeletal Exam No symptomatic pedal deformities noted bilateral. Muscular strength within normal limits.  ASSESSMENT 1.  Pain due to onychomycosis of toenails both  PLAN OF CARE 1. Patient evaluated today.  2. Instructed to maintain good pedal hygiene and foot care.  3. Mechanical debridement of nails 1-5 bilaterally performed using a nail nipper. Filed with dremel without incident.  4. Return to clinic in 3 mos.    Edrick Kins, DPM [facility]  Dr. Edrick Kins, DPM     [address], [place] [address]                Office [phone]  Fax [phone]

## 2021-06-02 ENCOUNTER — Ambulatory Visit: Payer: Medicare Other

## 2021-06-02 DIAGNOSIS — I4892 Unspecified atrial flutter: Secondary | ICD-10-CM

## 2021-06-02 DIAGNOSIS — Z5181 Encounter for therapeutic drug level monitoring: Secondary | ICD-10-CM

## 2021-06-02 LAB — POCT INR: INR: 3.4 — AB (ref 2.0–3.0)

## 2021-06-02 NOTE — Patient Instructions (Signed)
-  HOLD TONIGHT ONLY and then continue warfarin dosage of 1 tablet daily except 1.5 tablet on SUNDAYS, TUESDAYS & THURSDAYS.   - Recheck in 4 weeks.   Please be consistent with your green intake - please pick a day or days each week to have your greens and have them every week on that day (your pills are on a schedule, so your greens need to be on one) 

## 2021-06-04 DIAGNOSIS — H6061 Unspecified chronic otitis externa, right ear: Secondary | ICD-10-CM | POA: Diagnosis not present

## 2021-06-04 DIAGNOSIS — H903 Sensorineural hearing loss, bilateral: Secondary | ICD-10-CM | POA: Diagnosis not present

## 2021-06-04 DIAGNOSIS — R490 Dysphonia: Secondary | ICD-10-CM | POA: Diagnosis not present

## 2021-06-30 ENCOUNTER — Ambulatory Visit: Payer: Medicare Other

## 2021-06-30 DIAGNOSIS — I4892 Unspecified atrial flutter: Secondary | ICD-10-CM

## 2021-06-30 DIAGNOSIS — Z5181 Encounter for therapeutic drug level monitoring: Secondary | ICD-10-CM | POA: Diagnosis not present

## 2021-06-30 LAB — POCT INR: INR: 3.8 — AB (ref 2.0–3.0)

## 2021-06-30 NOTE — Patient Instructions (Signed)
-  HOLD TONIGHT ONLY and then continue warfarin dosage of 1 tablet daily except 1.5 tablet on SUNDAYS, TUESDAYS & THURSDAYS.   - Recheck in 4 weeks.   Please be consistent with your green intake - please pick a day or days each week to have your greens and have them every week on that day (your pills are on a schedule, so your greens need to be on one)

## 2021-07-28 ENCOUNTER — Ambulatory Visit: Payer: Medicare Other

## 2021-07-28 DIAGNOSIS — I4892 Unspecified atrial flutter: Secondary | ICD-10-CM | POA: Diagnosis not present

## 2021-07-28 DIAGNOSIS — Z5181 Encounter for therapeutic drug level monitoring: Secondary | ICD-10-CM | POA: Diagnosis not present

## 2021-07-28 LAB — POCT INR: INR: 2.6 (ref 2.0–3.0)

## 2021-07-28 NOTE — Patient Instructions (Signed)
continue warfarin dosage of 1 tablet daily except 1.5 tablet on SUNDAYS, TUESDAYS & THURSDAYS.   - Recheck in 6 weeks.   Please be consistent with your green intake - please pick a day or days each week to have your greens and have them every week on that day (your pills are on a schedule, so your greens need to be on one)

## 2021-08-15 ENCOUNTER — Emergency Department: Payer: Medicare Other

## 2021-08-15 ENCOUNTER — Emergency Department
Admission: EM | Admit: 2021-08-15 | Discharge: 2021-08-15 | Disposition: A | Discharge status: Home / Self Care | Payer: Medicare Other | Attending: Emergency Medicine | Admitting: Emergency Medicine

## 2021-08-15 DIAGNOSIS — N189 Chronic kidney disease, unspecified: Secondary | ICD-10-CM | POA: Insufficient documentation

## 2021-08-15 DIAGNOSIS — W19XXXA Unspecified fall, initial encounter: Secondary | ICD-10-CM | POA: Diagnosis not present

## 2021-08-15 DIAGNOSIS — W108XXA Fall (on) (from) other stairs and steps, initial encounter: Secondary | ICD-10-CM | POA: Diagnosis not present

## 2021-08-15 DIAGNOSIS — S0990XA Unspecified injury of head, initial encounter: Secondary | ICD-10-CM | POA: Diagnosis not present

## 2021-08-15 DIAGNOSIS — I130 Hypertensive heart and chronic kidney disease with heart failure and stage 1 through stage 4 chronic kidney disease, or unspecified chronic kidney disease: Secondary | ICD-10-CM | POA: Insufficient documentation

## 2021-08-15 DIAGNOSIS — R58 Hemorrhage, not elsewhere classified: Secondary | ICD-10-CM | POA: Diagnosis not present

## 2021-08-15 DIAGNOSIS — S0101XA Laceration without foreign body of scalp, initial encounter: Secondary | ICD-10-CM | POA: Diagnosis not present

## 2021-08-15 DIAGNOSIS — T1490XA Injury, unspecified, initial encounter: Secondary | ICD-10-CM | POA: Diagnosis not present

## 2021-08-15 DIAGNOSIS — Z7902 Long term (current) use of antithrombotics/antiplatelets: Secondary | ICD-10-CM | POA: Diagnosis not present

## 2021-08-15 DIAGNOSIS — Z743 Need for continuous supervision: Secondary | ICD-10-CM | POA: Diagnosis not present

## 2021-08-15 DIAGNOSIS — R000 Tachycardia, unspecified: Secondary | ICD-10-CM | POA: Diagnosis not present

## 2021-08-15 DIAGNOSIS — Z23 Encounter for immunization: Secondary | ICD-10-CM | POA: Diagnosis not present

## 2021-08-15 DIAGNOSIS — I5032 Chronic diastolic (congestive) heart failure: Secondary | ICD-10-CM | POA: Insufficient documentation

## 2021-08-15 DIAGNOSIS — E1122 Type 2 diabetes mellitus with diabetic chronic kidney disease: Secondary | ICD-10-CM | POA: Diagnosis not present

## 2021-08-15 DIAGNOSIS — J45909 Unspecified asthma, uncomplicated: Secondary | ICD-10-CM | POA: Diagnosis not present

## 2021-08-15 MED ORDER — TETANUS-DIPHTH-ACELL PERTUSSIS 5-2.5-18.5 LF-MCG/0.5 IM SUSY
0.5000 mL | PREFILLED_SYRINGE | Freq: Once | INTRAMUSCULAR | Status: AC
  Administered 2021-08-15: 0.5 mL via INTRAMUSCULAR
  Filled 2021-08-15: qty 0.5

## 2021-08-15 NOTE — ED Notes (Signed)
PTS SON GAVE PERMISSION TO TREAT PATIENT

## 2021-08-15 NOTE — ED Provider Notes (Signed)
[facility] Provider Note    Event Date/Time   First MD Initiated Contact with Patient 08/15/21 1236     (approximate)   History   Fall (Mech fall, on thinners, no loc  [phone] frances will pick pt up )   HPI  Michael Colon is a 88 y.o. male past medical history of diabetes atrial flutter on Coumadin CKD who presents after a fall.  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  Blood significantly from a laceration to the head.  Did not lose consciousness denies headache neck pain visual change numbness tingling weakness.  Otherwise feels at his baseline.  Last INR was checked was within normal range 2 weeks ago.  His INR has been relatively stable.     Past Medical History:  Diagnosis Date   Asthma    Atrial flutter ([place]) 2007   Band keratopathy    Benign prostatic hypertrophy    Chronic ear infection    Chronic kidney disease    Chronic prostatitis    Dental bridge present    permanent - upper   Diabetes mellitus    Elbow stiffness, left    s/p fracture many yrs ago.  arm does not straighten   GERD (gastroesophageal reflux disease)    laryngeal involvement   Hyperlipidemia    Hypertension    Obstructive sleep apnea    CPAP-9   Osteoarthrosis, localized, primary, knee    post-traumatic   Prostatitis    Stroke ([place])    TIA   Tachy-brady syndrome ([place])    UTI (urinary tract infection)     Patient Active Problem List   Diagnosis Date Noted   Orchitis of right testicle 11/30/2020   Right testicular pain 11/30/2020   Inguinal tenderness 11/30/2020   Onychomycosis 11/30/2020   Shingles 05/18/2020   Hematoma 03/06/2020   Chronic diastolic heart failure ([place]) 10/25/2019   Hypercalcemia 10/25/2019   Venous stasis ulcer ([place]) 08/20/2019   Chondrocalcinosis 09/05/2018   Primary osteoarthritis of right knee  08/19/2018   Aortic atherosclerosis ([place]) 12/28/2016   Chronic venous insufficiency 04/12/2016   Thrombocytopenia ([place]) 06/25/2015   Obstructive sleep apnea 06/25/2015   Enlarged prostate without lower urinary tract symptoms (luts) 10/14/2014   Advanced directives, counseling/discussion 12/13/2013   Encounter for therapeutic drug monitoring 01/30/2013   Routine general medical examination at a health care facility 12/11/2012   TIA (transient ischemic attack) 12/11/2012   Ventricular tachycardia-pause dependent 10/16/2012   Sinus bradycardia 09/07/2012   Bradycardia 09/07/2012   Edema 09/07/2012   Localized edema 09/07/2012   Mixed hyperlipidemia    Abnormal EKG 09/26/2011   Atrial flutter ([place]) 07/21/2010   Encounter for anticoagulation discussion and counseling 07/21/2010   Type 2 diabetes, controlled, with neuropathy ([place])    Tachy-brady syndrome ([place])    BPH with obstruction/lower urinary tract symptoms    Unspecified asthma(493.90)    GERD (gastroesophageal reflux disease)    Essential hypertension      Physical Exam  Triage Vital Signs: ED Triage Vitals  Enc Vitals Group     BP 08/15/21 1245 (!) 152/74     Pulse Rate 08/15/21 1237 63     Resp 08/15/21 1237 17     Temp 08/15/21 1237 98.5 F (36.9 C)     Temp Source 08/15/21 1237 Oral  SpO2 08/15/21 1237 96 %     Weight 08/15/21 1238 209 lb 7 oz (95 kg)     Height 08/15/21 1238 6' (1.829 m)     Head Circumference --      Peak Flow --      Pain Score --      Pain Loc --      Pain Edu? --      Excl. in [place]? --     Most recent vital signs: Vitals:   08/15/21 1245 08/15/21 1252  BP: (!) 152/74   Pulse:    Resp:    Temp:  98.2 F (36.8 C)  SpO2:       General: Awake, no distress.  CV:  Good peripheral perfusion. Resp:  Normal effort.  Abd:  No distention.  Neuro:             Awake, Alert, Oriented x 3  Other:  No C-spine tenderness  Family 1 cm linear well approximated laceration in the posterior  occiput that is hemostatic Skin tear over the left elbow which has decreased range of motion which is chronic for him due to prior surgery  Aox3, nml speech  PERRL, EOMI, face symmetric, nml tongue movement  5/5 strength in the BL upper and lower extremities  Sensation grossly intact in the BL upper and lower extremities  Finger-nose-finger intact BL    ED Results / Procedures / Treatments  Labs (all labs ordered are listed, but only abnormal results are displayed) Labs Reviewed - No data to display   EKG  EKG showing right bundle branch morphology, suspect Mobitz 1, PVCs no acute ischemic changes   RADIOLOGY I reviewed and interpreted the CT scan of the brain which does not show any acute intracranial process    PROCEDURES:  Critical Care performed: No  ..Laceration Repair  Date/Time: 08/15/2021 2:37 PM  Performed by: Rada Hay, MD Authorized by: Rada Hay, MD   Consent:    Consent obtained:  Verbal   Risks discussed:  Pain Universal protocol:    Patient identity confirmed:  Verbally with patient Laceration details:    Location:  Scalp   Scalp location:  L parietal   Length (cm):  1 Exploration:    Limited defect created (wound extended): no     Contaminated: no   Treatment:    Area cleansed with:  Saline   Amount of cleaning:  Standard   Irrigation solution:  Sterile saline   Irrigation method:  Syringe   Visualized foreign bodies/material removed: no     Debridement:  None   Undermining:  None Skin repair:    Repair method:  Staples   Number of staples:  2 Approximation:    Approximation:  Close Repair type:    Repair type:  Simple Post-procedure details:    Dressing:  Open (no dressing)   The patient is on the cardiac monitor to evaluate for evidence of arrhythmia and/or significant heart rate changes.   MEDICATIONS ORDERED IN ED: Medications  Tdap (BOOSTRIX) injection 0.5 mL (0.5 mLs Intramuscular Given 08/15/21 1313)      IMPRESSION / MDM / ASSESSMENT AND PLAN / ED COURSE  I reviewed the triage vital signs and the nursing notes.                              Patient's presentation is most consistent with acute presentation with potential threat to life or  bodily function.  Differential diagnosis includes, but is not limited to, intracranial hemorrhage, skull fracture, cervical spine fracture, laceration, concussion  Patient is a 88 year old male history of atrial flutter on Coumadin presents after a mechanical fall.  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  He has a small laceration to posterior occiput that is hemostatic.  He is neurologically intact no C-spine tenderness.  Had a skin tear over the left elbow as well that he is chronically unable to range due to prior injury requiring surgery.  Patient feeling well now no headache.  CT head and C-spine are negative.  Last INR was 2 weeks ago was normal range.  EKG showing what I believed to be Mobitz 1 second-degree heart block, reviewed last cardiology note Dr. Doyne Keel comments that he either has a first-degree AV block versus Mobitz 1 so do not feel this is far off his baseline.  He is not having chest pain lightheadedness did not syncopized today.  Laceration was repaired with staples.  He is appropriate for discharge at this time.       FINAL CLINICAL IMPRESSION(S) / ED DIAGNOSES   Final diagnoses:  None     Rx / DC Orders   ED Discharge Orders     None        Note:  This document was prepared using Dragon voice recognition software and may include unintentional dictation errors.   Rada Hay, MD 08/15/21 1438

## 2021-08-15 NOTE — ED Triage Notes (Signed)
Mech fall, no loc, on warfarin

## 2021-08-15 NOTE — Discharge Instructions (Signed)
Please either see your primary doctor or go to urgent care or return to the emergency department for removal of your staples in about 10 days.

## 2021-08-16 ENCOUNTER — Other Ambulatory Visit: Payer: Self-pay | Admitting: Otolaryngology

## 2021-08-16 DIAGNOSIS — E041 Nontoxic single thyroid nodule: Secondary | ICD-10-CM

## 2021-08-16 DIAGNOSIS — K219 Gastro-esophageal reflux disease without esophagitis: Secondary | ICD-10-CM | POA: Diagnosis not present

## 2021-08-16 DIAGNOSIS — R490 Dysphonia: Secondary | ICD-10-CM | POA: Diagnosis not present

## 2021-08-16 DIAGNOSIS — H6063 Unspecified chronic otitis externa, bilateral: Secondary | ICD-10-CM | POA: Diagnosis not present

## 2021-08-17 ENCOUNTER — Ambulatory Visit
Admission: RE | Admit: 2021-08-17 | Discharge: 2021-08-17 | Disposition: A | Discharge status: Home / Self Care | Payer: Medicare Other | Source: Ambulatory Visit | Attending: Otolaryngology | Admitting: Otolaryngology

## 2021-08-17 DIAGNOSIS — E042 Nontoxic multinodular goiter: Secondary | ICD-10-CM | POA: Diagnosis not present

## 2021-08-17 DIAGNOSIS — E041 Nontoxic single thyroid nodule: Secondary | ICD-10-CM

## 2021-08-19 ENCOUNTER — Encounter: Payer: Self-pay | Admitting: Podiatry

## 2021-08-19 ENCOUNTER — Ambulatory Visit: Payer: Medicare Other | Admitting: Podiatry

## 2021-08-19 ENCOUNTER — Telehealth: Payer: Self-pay

## 2021-08-19 DIAGNOSIS — B351 Tinea unguium: Secondary | ICD-10-CM | POA: Diagnosis not present

## 2021-08-19 DIAGNOSIS — E1140 Type 2 diabetes mellitus with diabetic neuropathy, unspecified: Secondary | ICD-10-CM | POA: Diagnosis not present

## 2021-08-19 DIAGNOSIS — M79675 Pain in left toe(s): Secondary | ICD-10-CM

## 2021-08-19 DIAGNOSIS — M79674 Pain in right toe(s): Secondary | ICD-10-CM

## 2021-08-19 NOTE — Progress Notes (Signed)
This patient returns to my office for at risk foot care.  This patient requires this care by a professional since this patient will be at risk due to having diabetic neuropathy.  This patient is unable to cut nails himself since the patient cannot reach his nails.These nails are painful walking and wearing shoes.  This patient presents for at risk foot care today.  General Appearance  Alert, conversant and in no acute stress.  Vascular  Dorsalis pedis and posterior tibial  pulses are  weakly  palpable  bilaterally.  Capillary return is within normal limits  bilaterally. Cold feet  bilaterally.  Neurologic  Senn-Weinstein monofilament wire test within normal limits  bilaterally. Muscle power within normal limits bilaterally.  Nails Thick disfigured discolored nails with subungual debris  from hallux to fifth toes bilaterally. No evidence of bacterial infection or drainage bilaterally.  Orthopedic  No limitations of motion  feet .  No crepitus or effusions noted.  No bony pathology or digital deformities noted.  Skin  normotropic skin with no porokeratosis noted bilaterally.  No signs of infections or ulcers noted.     Onychomycosis  Pain in right toes  Pain in left toes  Consent was obtained for treatment procedures.   Mechanical debridement of nails 1-5  bilaterally performed with a nail nipper.  Filed with dremel without incident.    Return office visit   3 months                  Told patient to return for periodic foot care and evaluation due to potential at risk complications.   Darrian Grzelak DPM   

## 2021-08-19 NOTE — Telephone Encounter (Signed)
Elbow pain is getting better. Bump on head is getting better.Will need to have the staples removed at his OV on 08-25-21.

## 2021-08-20 ENCOUNTER — Other Ambulatory Visit: Payer: Self-pay | Admitting: Otolaryngology

## 2021-08-20 DIAGNOSIS — E041 Nontoxic single thyroid nodule: Secondary | ICD-10-CM

## 2021-08-25 ENCOUNTER — Encounter: Payer: Self-pay | Admitting: Internal Medicine

## 2021-08-25 ENCOUNTER — Ambulatory Visit (INDEPENDENT_AMBULATORY_CARE_PROVIDER_SITE_OTHER): Payer: Medicare Other | Admitting: Internal Medicine

## 2021-08-25 DIAGNOSIS — S59902D Unspecified injury of left elbow, subsequent encounter: Secondary | ICD-10-CM | POA: Insufficient documentation

## 2021-08-25 DIAGNOSIS — S0101XD Laceration without foreign body of scalp, subsequent encounter: Secondary | ICD-10-CM | POA: Diagnosis not present

## 2021-08-25 NOTE — Assessment & Plan Note (Signed)
No problems with use of it No dressing needed now reassured

## 2021-08-25 NOTE — Progress Notes (Signed)
Subjective:    Patient ID: Michael Colon, male    DOB: 1930/12/02, 88 y.o.   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     atorvastatin (LIPITOR) 80 MG tablet Take 1 tablet (80 mg total) by mouth daily. 90 tablet 3   cetirizine (ZYRTEC) 10 MG tablet Take 10 mg by mouth daily.     Cyanocobalamin (B-12) 2500 MCG SUBL Place under the tongue.     docusate sodium (COLACE) 100 MG capsule Take 100 mg by mouth 2 (two) times daily.     famotidine (PEPCID) 20 MG tablet Take 20 mg by mouth daily.     finasteride (PROSCAR) 5 MG tablet TAKE 1 TABLET BY MOUTH ONCE DAILY 90 tablet 3   glucose blood (BAYER CONTOUR TEST) test strip USE STRIP TO TEST BLOOD SUGAR ONCE DAILY. Dx Code: E11.40 100 each 3   ibuprofen (ADVIL,MOTRIN) 200 MG tablet Take 400 mg by mouth 2 (two) times daily.      loratadine (CLARITIN) 10 MG tablet Take 10 mg by mouth daily.     metFORMIN (GLUCOPHAGE) 500 MG tablet TAKE 1 TABLET BY MOUTH  TWICE DAILY WITH MEALS (Patient taking differently: Take 500 mg by mouth daily at 2 PM.) 180 tablet 3   Microlet Lancets MISC Use to test twice daily or as directed 200 each 3   tamsulosin (FLOMAX) 0.4 MG CAPS capsule TAKE 1 CAPSULE BY MOUTH  DAILY 90 capsule 3   triamterene-hydrochlorothiazide (DYAZIDE) 37.5-25 MG capsule Take 1 each (1 capsule total) by mouth every morning. 90 capsule 3   warfarin (COUMADIN) 5 MG tablet TAKE 1 TO 1 AND 1/2 TABLETS BY  MOUTH DAILY AS DIRECTED  BY COUMADIN CLINIC 135 tablet 3   furosemide (LASIX) 20 MG tablet Take 1 tablet (20 mg total) by mouth daily as needed (Extra pill as needed for weight 215 or higher). Once daily and extra pill as needed for weight 215 or higher. 180 tablet 0   No current facility-administered medications on file prior to visit.    Allergies  Allergen Reactions   Enoxaparin Hives   Enoxaparin Sodium Hives   Latex Rash    Has trouble with BANDAIDS that have been left on for more than 24 hours. Prefers paper tape.   Nickel Rash   Percocet [Oxycodone-Acetaminophen] Rash   Tape Rash and Other (See Comments)    Sensitivity     Past Medical History:  Diagnosis Date   Asthma    Atrial flutter ([place]) 2007   Band keratopathy    Benign prostatic hypertrophy    Chronic ear infection    Chronic kidney disease    Chronic prostatitis    Dental bridge present    permanent - upper   Diabetes mellitus    Elbow stiffness, left    s/p fracture many yrs ago.  arm does not straighten   GERD (gastroesophageal reflux disease)  laryngeal involvement   Hyperlipidemia    Hypertension    Obstructive sleep apnea    CPAP-9   Osteoarthrosis, localized, primary, knee    post-traumatic   Prostatitis    Stroke ([place])    TIA   Tachy-brady syndrome ([place])    UTI (urinary tract infection)     Past Surgical History:  Procedure Laterality Date   APPENDECTOMY     BELPHAROPTOSIS REPAIR     Dr [name]---didn't resolve weepy eye and eyelid drooping   CARDIOVERSION  04/13/2010   CATARACT EXTRACTION, BILATERAL  2009   Chest pain  8/12   Stress test benign   ESOPHAGEAL DILATION  05/26/2016   Procedure: ESOPHAGEAL DILATION;  Surgeon: Lucilla Lame, MD;  Location: [facility];  Service: Endoscopy;;   ESOPHAGOGASTRODUODENOSCOPY (EGD) WITH PROPOFOL N/A 05/26/2016   Procedure: ESOPHAGOGASTRODUODENOSCOPY (EGD) WITH PROPOFOL;  Surgeon: Lucilla Lame, MD;  Location: [facility];  Service:  Endoscopy;  Laterality: N/A;  Diabetic - oral meds sleep apnea   EYE SURGERY     FRACTURE SURGERY Left    elbow   KNEE SURGERY  1998   plate after fracture, then removed for infection   left elbow surgery     MASTOIDECTOMY  8/08   Dr Idelle Crouch   RHINOPLASTY  5/10   and septoplasty   SUBACROMIAL DECOMPRESSION Right 2005   Arthroscopic (for rotator cuff and biceps tendon ruptures)   TEAR DUCT PROBING  11/13   Dr Vickki Muff   TOTAL KNEE ARTHROPLASTY Left 09/01/2014   Procedure: TOTAL KNEE ARTHROPLASTY;  Surgeon: Dereck Leep, MD;  Location: [facility];  Service: Orthopedics;  Laterality: Left;   TRIGGER FINGER RELEASE Left 02/25/2015   Procedure: LEFT LONG TRIGGER RELEASE;  Surgeon: Dereck Leep, MD;  Location: [facility];  Service: Orthopedics;  Laterality: Left;   VENOUS ABLATION      Family History  Problem Relation Age of Onset   Colon cancer Father    Diabetes Father    Hypertension Father    Other Mother        natural causes   Heart attack Neg Hx    Stroke Neg Hx     Social History   Socioeconomic History   Marital status: Widowed    Spouse name: Not on file   Number of children: 2   Years of education: Not on file   Highest education level: Not on file  Occupational History   Occupation: Pastor    Comment: Retired--[employer]  Tobacco Use   Smoking status: Former    Packs/day: 1.00    Years: 0.00    Total pack years: 0.00    Types: Cigarettes    Quit date: 01/03/1969    Years since quitting: 52.6    Passive exposure: Never   Smokeless tobacco: Never  Vaping Use   Vaping Use: Never used  Substance and Sexual Activity   Alcohol use: No    Alcohol/week: 1.0 standard drink of alcohol    Types: 1 Glasses of wine per week    Comment: rare wine. 1-2x/yr.   Drug use: No   Sexual activity: Not on file  Other Topics Concern   Not on file  Social History Narrative   Has living will   Health care POA-- son Shanon Brow   Has DNR order from the past---form redone 06/25/10    Probably no feeding tube if cognitively unaware   Social Determinants of Health   Financial Resource Strain: Not on file  Food Insecurity:  Not on file  Transportation Needs: Not on file  Physical Activity: Not on file  Stress: Not on file  Social Connections: Not on file  Intimate Partner Violence: Not on file   Review of Systems Is scheduled for biopsy of thyroid nodule Travelling to [place] ---grandson Insurance claims handler at [employer]. Will fly but is concerned about past headaches with re-pressurization     Objective:   Physical Exam Constitutional:      Appearance: Normal appearance.  HENT:     Head:     Comments: Well healed laceration left parieto-occipital scalp 2 sutures removed without incident Skin:    Comments: Ulcer at left elbow is now well granulated Discussed that now further dressing needed  Neurological:     Mental Status: He is alert.            Assessment & Plan:

## 2021-08-25 NOTE — Assessment & Plan Note (Signed)
Well healed Staples removed CT benign--and no evidence of concussion

## 2021-08-26 ENCOUNTER — Ambulatory Visit
Admission: RE | Admit: 2021-08-26 | Discharge: 2021-08-26 | Disposition: A | Discharge status: Home / Self Care | Payer: Medicare Other | Source: Ambulatory Visit | Attending: Otolaryngology | Admitting: Otolaryngology

## 2021-08-26 DIAGNOSIS — E041 Nontoxic single thyroid nodule: Secondary | ICD-10-CM | POA: Insufficient documentation

## 2021-08-26 DIAGNOSIS — D34 Benign neoplasm of thyroid gland: Secondary | ICD-10-CM | POA: Insufficient documentation

## 2021-08-26 NOTE — Procedures (Signed)
Successful US guided FNA of left inferior thyroid nodule No complications. See PACS for full report.  Hedy Jacob, PA-C 08/26/2021, 2:19 PM

## 2021-08-27 LAB — CYTOLOGY - NON PAP

## 2021-08-31 ENCOUNTER — Other Ambulatory Visit: Payer: Self-pay | Admitting: Internal Medicine

## 2021-08-31 NOTE — Progress Notes (Unsigned)
Cardiology Office Note    Date:  09/01/2021   ID:  Michael Colon, DOB [birth year], MRN [id number]  PCP:  Venia Carbon, MD  Cardiologist:  Ida Rogue, MD  Electrophysiologist:  None   Chief Complaint: ED follow-up  History of Present Illness:   Michael Colon is a 88 y.o. male with history of atrial flutter status post prior ablation in 2014 Colon warfarin, tachybradycardia syndrome, second-degree AV block type I, coronary artery calcification, reported TIA in 2014 with negative work-up, diabetes, HTN, HLD, varicose veins status post laser ablation, BPH, chronic lower extremity swelling, and OSA who presents for ED follow up  Prior Holter monitor showed sinus rhythm with pauses up to 2.86 seconds, bradycardia with rates into the 20s to 30s bpm overnight and 30s to 50s bpm during the day.  In this setting, he was evaluated by EP for bradycardia with PPM not recommended at that time.  Prior echo from 2011 demonstrated an EF 65 to 42%, diastolic dysfunction, degenerative mitral valve with mitral valve prolapse and trace to mild regurgitation, dilated left atrium, aortic atherosclerosis, dilated thoracic aorta, normal RV systolic function, trivial tricuspid regurgitation.  Calcium score from 04/2015 of 1349, which was the 88th percentile for age and sex matched control.  In this setting, he underwent Lexiscan MPI in 04/2015 which showed no evidence of ischemia with normal EF and was overall a low risk study.  Most recent echo from 10/2018 demonstrated an EF of 65 to 70%, severe asymmetric LVH of the basal septal segment, indeterminate LV diastolic function parameters, normal RV systolic function with mildly enlarged ventricular cavity size, mildly elevated PASP, moderately dilated left atrium, trivial mitral regurgitation, mild to moderate aortic valve sclerosis without evidence of stenosis, mildly dilated ascending aorta measuring 38 mm, and an estimated right atrial pressure of 8 mmHg.  He was  last seen in the office in 03/2020, and was without symptoms of angina or decompensation.  At that time, he had a left arm hematoma felt to be related to lifting furniture and in the context of a supratherapeutic INR at 3.5.  No medication changes were made at that time.  He was seen in the ED Colon 08/15/2021 after sustaining a mechanical fall, losing his balance while going down the stairs and hitting his head Colon a piece of furniture with a sharp corner.  He had a laceration to the head.  He did not suffer LOC.  CT head showed no acute intracranial findings.  CT cervical spine showed no recent fracture.  Incidentally noted was a larger than right left thyroid lobe with scattered coarse calcifications suggestive of goiter.  He has subsequently undergone ultrasound of the thyroid and FNA.  EKG concerning for high-grade AV block.  His laceration was repaired with staples.  He comes in today for follow-up of the above ED visit.  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  He reports he did not suffer LOC.  He was without dizziness or presyncope.  No chest pain, diaphoresis, or dyspnea.  Since this event, he has noted an increase in fatigue and napping.  He has not had any syncopal episodes since his ED visit.  He is not currently Colon any AV nodal blocking medications.  In the office today, he is without symptoms of angina or decompensation.  BP 132/72.  EKG notable for complete  heart block.   Labs independently reviewed: 07/28/2021 - INR 2.6 02/2021 - Hgb 14.4, PLT 148, A1c 7.6, TC 131, TG 67, HDL 70, LDL 47, potassium 4.3, BUN 32, serum creatinine 1.02, albumin 3.8, AST/ALT normal 02/2019 - Free T4 normal  Past Medical History:  Diagnosis Date   Asthma    Atrial flutter ([place]) 2007   Band keratopathy    Benign prostatic hypertrophy    Chronic ear infection    Chronic kidney disease    Chronic  prostatitis    Dental bridge present    permanent - upper   Diabetes mellitus    Elbow stiffness, left    s/p fracture many yrs ago.  arm does not straighten   GERD (gastroesophageal reflux disease)    laryngeal involvement   Hyperlipidemia    Hypertension    Obstructive sleep apnea    CPAP-9   Osteoarthrosis, localized, primary, knee    post-traumatic   Prostatitis    Stroke ([place])    TIA   Tachy-brady syndrome ([place])    UTI (urinary tract infection)     Past Surgical History:  Procedure Laterality Date   APPENDECTOMY     BELPHAROPTOSIS REPAIR     Dr [name]---didn't resolve weepy eye and eyelid drooping   CARDIOVERSION  04/13/2010   CATARACT EXTRACTION, BILATERAL  2009   Chest pain  8/12   Stress test benign   ESOPHAGEAL DILATION  05/26/2016   Procedure: ESOPHAGEAL DILATION;  Surgeon: Lucilla Lame, MD;  Location: [facility];  Service: Endoscopy;;   ESOPHAGOGASTRODUODENOSCOPY (EGD) WITH PROPOFOL N/A 05/26/2016   Procedure: ESOPHAGOGASTRODUODENOSCOPY (EGD) WITH PROPOFOL;  Surgeon: Lucilla Lame, MD;  Location: [facility];  Service: Endoscopy;  Laterality: N/A;  Diabetic - oral meds sleep apnea   EYE SURGERY     FRACTURE SURGERY Left    elbow   KNEE SURGERY  1998   plate after fracture, then removed for infection   left elbow surgery     MASTOIDECTOMY  8/08   Dr Idelle Crouch   RHINOPLASTY  5/10   and septoplasty   SUBACROMIAL DECOMPRESSION Right 2005   Arthroscopic (for rotator cuff and biceps tendon ruptures)   TEAR DUCT PROBING  11/13   Dr Vickki Muff   TOTAL KNEE ARTHROPLASTY Left 09/01/2014   Procedure: TOTAL KNEE ARTHROPLASTY;  Surgeon: Dereck Leep, MD;  Location: [facility];  Service: Orthopedics;  Laterality: Left;   TRIGGER FINGER RELEASE Left 02/25/2015   Procedure: LEFT LONG TRIGGER RELEASE;  Surgeon: Dereck Leep, MD;  Location: [facility];  Service: Orthopedics;  Laterality: Left;   VENOUS ABLATION      Current Medications: Current Meds   Medication Sig   APPLE CIDER VINEGAR PO Take by mouth.   atorvastatin (LIPITOR) 80 MG tablet Take 1 tablet (80 mg total) by mouth daily.   cetirizine (ZYRTEC) 10 MG tablet Take 10 mg by mouth daily.   Cyanocobalamin (B-12) 2500 MCG SUBL Place under the tongue.   docusate sodium (COLACE) 100 MG capsule Take 100 mg by mouth 2 (two) times daily.   famotidine (PEPCID) 20 MG tablet Take 20 mg by mouth daily.   finasteride (PROSCAR) 5 MG tablet TAKE 1 TABLET BY MOUTH ONCE DAILY   glucose blood (BAYER CONTOUR TEST) test strip USE STRIP TO TEST BLOOD SUGAR ONCE DAILY. Dx Code: E11.40   ibuprofen (ADVIL,MOTRIN) 200 MG tablet Take 400 mg by mouth 2 (two) times daily.    loratadine (CLARITIN) 10 MG tablet Take 10 mg  by mouth daily.   metFORMIN (GLUCOPHAGE) 500 MG tablet TAKE 1 TABLET BY MOUTH  TWICE DAILY WITH MEALS (Patient taking differently: Take 500 mg by mouth daily at 2 PM.)   Microlet Lancets MISC Use to test twice daily or as directed   triamterene-hydrochlorothiazide (DYAZIDE) 37.5-25 MG capsule TAKE 1 CAPSULE BY MOUTH IN  THE MORNING   warfarin (COUMADIN) 5 MG tablet TAKE 1 TO 1 AND 1/2 TABLETS BY MOUTH DAILY AS DIRECTED  BY COUMADIN CLINIC    Allergies:   Enoxaparin, Enoxaparin sodium, Latex, Nickel, Percocet [oxycodone-acetaminophen], and Tape   Social History   Socioeconomic History   Marital status: Widowed    Spouse name: Not Colon file   Number of children: 2   Years of education: Not Colon file   Highest education level: Not Colon file  Occupational History   Occupation: Pastor    Comment: Retired--[employer]  Tobacco Use   Smoking status: Former    Packs/day: 1.00    Years: 0.00    Total pack years: 0.00    Types: Cigarettes    Quit date: 01/03/1969    Years since quitting: 52.6    Passive exposure: Never   Smokeless tobacco: Never  Vaping Use   Vaping Use: Never used  Substance and Sexual Activity   Alcohol use: No    Alcohol/week: 1.0 standard drink of alcohol    Types: 1  Glasses of wine per week    Comment: rare wine. 1-2x/yr.   Drug use: No   Sexual activity: Not Colon file  Other Topics Concern   Not Colon file  Social History Narrative   Has living will   Health care POA-- son Shanon Brow   Has DNR order from the past---form redone 06/25/10   Probably no feeding tube if cognitively unaware   Social Determinants of Health   Financial Resource Strain: Not Colon file  Food Insecurity: Not Colon file  Transportation Needs: Not Colon file  Physical Activity: Not Colon file  Stress: Not Colon file  Social Connections: Not Colon file     Family History:  The patient's family history includes Colon cancer in his father; Diabetes in his father; Hypertension in his father; Other in his mother. There is no history of Heart attack or Stroke.  ROS:   12-point review of systems is negative unless otherwise noted in the HPI.   EKGs/Labs/Other Studies Reviewed:    Studies reviewed were summarized above. The additional studies were reviewed today:  2D echo 10/15/2019: 1. Left ventricular ejection fraction, by estimation, is 65 to 70%. The  left ventricle has normal function. Left ventricular endocardial border  not optimally defined to evaluate regional wall motion. There is severe  asymmetric left ventricular  hypertrophy of the basal-septal segment. Left ventricular diastolic  parameters are indeterminate.   2. Right ventricular systolic function is normal. The right ventricular  size is mildly enlarged. There is mildly elevated pulmonary artery  systolic pressure.   3. Left atrial size was moderately dilated.   4. The mitral valve was not well visualized. Trivial mitral valve  regurgitation. No evidence of mitral stenosis.   5. The aortic valve is tricuspid. There is mild calcification of the  aortic valve. There is moderate thickening of the aortic valve. Aortic  valve regurgitation is not visualized. Mild to moderate aortic valve  sclerosis/calcification is present, without   any evidence of aortic stenosis.   6. There is mild dilatation of the ascending aorta, measuring 38 mm.  7. The inferior vena cava is dilated in size with >50% respiratory  variability, suggesting right atrial pressure of 8 mmHg.  __________  Carlton Adam MPI 04/15/2015: Pharmacological myocardial perfusion imaging study with no significant  ischemia Normal wall motion, EF estimated at 67% No EKG changes concerning for ischemia at peak stress or in recovery. Low risk scan __________  Calcium score 04/06/2015: IMPRESSION: 1. Coronary calcium score of 1349. This was 78 percentile for age and sex matched control. Further ischemic work up and an aggressive risk factor modification is recommended. 2. Dilated ascending aorta Colon the axial images. A dedicated chest CTA or MRA is recommended for further evaluation.  EKG:  EKG is ordered today.  The EKG ordered today demonstrates complete heart block, 36 bpm, RBBB, nonspecific ST-T changes  Recent Labs: 02/25/2021: ALT 11; BUN 32; Creatinine, Ser 1.02; Hemoglobin 14.4; Platelets 148.0; Potassium 4.3; Sodium 138  Recent Lipid Panel    Component Value Date/Time   CHOL 131 02/25/2021 1507   TRIG 67.0 02/25/2021 1507   HDL 70.00 02/25/2021 1507   CHOLHDL 2 02/25/2021 1507   VLDL 13.4 02/25/2021 1507   LDLCALC 47 02/25/2021 1507    PHYSICAL EXAM:    VS:  BP 132/72 (BP Location: Left Arm, Patient Position: Sitting, Cuff Size: Normal)   Pulse (!) 36   Ht '5\' 10"'$  (1.778 m)   Wt 202 lb 6.4 oz (91.8 kg)   SpO2 95%   BMI 29.04 kg/m   BMI: Body mass index is 29.04 kg/m.  Physical Exam Vitals reviewed.  Constitutional:      Appearance: He is well-developed.  HENT:     Head: Normocephalic and atraumatic.  Eyes:     General:        Right eye: No discharge.        Left eye: No discharge.  Neck:     Vascular: No JVD.  Cardiovascular:     Rate and Rhythm: Normal rate and regular rhythm.     Pulses:          Posterior tibial pulses are 2+  Colon the right side and 2+ Colon the left side.     Heart sounds: Normal heart sounds, S1 normal and S2 normal. Heart sounds not distant. No midsystolic click and no opening snap. No murmur heard.    No friction rub.  Pulmonary:     Effort: Pulmonary effort is normal. No respiratory distress.     Breath sounds: Normal breath sounds. No decreased breath sounds, wheezing or rales.  Chest:     Chest wall: No tenderness.  Abdominal:     General: There is no distension.  Musculoskeletal:     Cervical back: Normal range of motion.     Right lower leg: Edema present.     Left lower leg: Edema present.     Comments: Trivial bilateral pretibial edema.  Skin:    General: Skin is warm and dry.     Nails: There is no clubbing.  Neurological:     Mental Status: He is alert and oriented to person, place, and time.  Psychiatric:        Speech: Speech normal.        Behavior: Behavior normal.        Thought Content: Thought content normal.        Judgment: Judgment normal.     Wt Readings from Last 3 Encounters:  09/01/21 202 lb 6.4 oz (91.8 kg)  08/25/21 203 lb (92.1 kg)  08/15/21  209 lb 7 oz (95 kg)     ASSESSMENT & PLAN:   Complete heart block: Currently asymptomatic, hemodynamically stable.  Case was discussed with Dr. Quentin Ore with EP with recommendation for the patient to be transferred to [facility], ED for admission and pacemaker implantation.  Patient is agreeable to this plan.  He presented to the office today by himself.  He does not know any friends or family's phone numbers to have them pick him up at our office.  We recommended EMS for transport to [facility].  The patient refused to this.  He is aware of the potential risks of this up to and including syncope while driving injuring himself or others up to and including death of himself or third-party.  He is of sound mind and he accepts these risks Colon his own accord despite our recommendations otherwise.  We have attempted to convince  him otherwise numerous times this morning.  Patient is not currently Colon any AV nodal blocking medications.  Coronary artery calcification: No symptoms suggestive of angina or decompensation.  Continue current medical therapy.  This was not discussed in detail given acute visit.  A-fib/flutter: Status post flutter ablation in 2014.  He remains Colon warfarin.  HFpEF: He appears euvolemic and well compensated.  Currently Colon low-dose as needed furosemide.  HTN: Blood pressure stable.  HLD: LDL 47 in 02/2021.  He remains Colon atorvastatin.    Disposition: F/u with Dr. Rockey Situ or an APP following admission to [facility].   Medication Adjustments/Labs and Tests Ordered: Current medicines are reviewed at length with the patient today.  Concerns regarding medicines are outlined above. Medication changes, Labs and Tests ordered today are summarized above and listed in the Patient Instructions accessible in Encounters.   Signed, Christell Faith, PA-C 09/01/2021 8:39 AM     [facility] [address] [place] [address] [place] [address] [place], [place] [address] [phone]

## 2021-09-01 ENCOUNTER — Emergency Department (HOSPITAL_COMMUNITY): Payer: Medicare Other

## 2021-09-01 ENCOUNTER — Encounter (HOSPITAL_COMMUNITY): Payer: Self-pay

## 2021-09-01 ENCOUNTER — Inpatient Hospital Stay (HOSPITAL_COMMUNITY)
Admission: EM | Admit: 2021-09-01 | Discharge: 2021-09-03 | DRG: 244 | Disposition: A | Discharge status: Home / Self Care | Payer: Medicare Other | Attending: Internal Medicine | Admitting: Internal Medicine

## 2021-09-01 ENCOUNTER — Other Ambulatory Visit: Payer: Self-pay

## 2021-09-01 ENCOUNTER — Encounter: Payer: Self-pay | Admitting: Physician Assistant

## 2021-09-01 ENCOUNTER — Ambulatory Visit: Payer: Medicare Other | Attending: Physician Assistant | Admitting: Physician Assistant

## 2021-09-01 VITALS — BP 132/72 | HR 36 | Ht 70.0 in | Wt 202.4 lb

## 2021-09-01 DIAGNOSIS — I10 Essential (primary) hypertension: Secondary | ICD-10-CM | POA: Diagnosis present

## 2021-09-01 DIAGNOSIS — Z833 Family history of diabetes mellitus: Secondary | ICD-10-CM

## 2021-09-01 DIAGNOSIS — N400 Enlarged prostate without lower urinary tract symptoms: Secondary | ICD-10-CM | POA: Diagnosis present

## 2021-09-01 DIAGNOSIS — I480 Paroxysmal atrial fibrillation: Secondary | ICD-10-CM | POA: Diagnosis present

## 2021-09-01 DIAGNOSIS — Z8249 Family history of ischemic heart disease and other diseases of the circulatory system: Secondary | ICD-10-CM | POA: Diagnosis not present

## 2021-09-01 DIAGNOSIS — Z79899 Other long term (current) drug therapy: Secondary | ICD-10-CM | POA: Diagnosis not present

## 2021-09-01 DIAGNOSIS — I2510 Atherosclerotic heart disease of native coronary artery without angina pectoris: Secondary | ICD-10-CM | POA: Diagnosis not present

## 2021-09-01 DIAGNOSIS — Z8673 Personal history of transient ischemic attack (TIA), and cerebral infarction without residual deficits: Secondary | ICD-10-CM

## 2021-09-01 DIAGNOSIS — Z7984 Long term (current) use of oral hypoglycemic drugs: Secondary | ICD-10-CM | POA: Diagnosis not present

## 2021-09-01 DIAGNOSIS — I442 Atrioventricular block, complete: Secondary | ICD-10-CM | POA: Diagnosis not present

## 2021-09-01 DIAGNOSIS — G4733 Obstructive sleep apnea (adult) (pediatric): Secondary | ICD-10-CM | POA: Diagnosis not present

## 2021-09-01 DIAGNOSIS — E119 Type 2 diabetes mellitus without complications: Secondary | ICD-10-CM | POA: Diagnosis present

## 2021-09-01 DIAGNOSIS — E782 Mixed hyperlipidemia: Secondary | ICD-10-CM | POA: Diagnosis not present

## 2021-09-01 DIAGNOSIS — I495 Sick sinus syndrome: Secondary | ICD-10-CM | POA: Diagnosis present

## 2021-09-01 DIAGNOSIS — I453 Trifascicular block: Secondary | ICD-10-CM | POA: Diagnosis not present

## 2021-09-01 DIAGNOSIS — I5032 Chronic diastolic (congestive) heart failure: Secondary | ICD-10-CM

## 2021-09-01 DIAGNOSIS — I4892 Unspecified atrial flutter: Secondary | ICD-10-CM

## 2021-09-01 DIAGNOSIS — Z96652 Presence of left artificial knee joint: Secondary | ICD-10-CM | POA: Diagnosis not present

## 2021-09-01 DIAGNOSIS — Z91040 Latex allergy status: Secondary | ICD-10-CM

## 2021-09-01 DIAGNOSIS — K219 Gastro-esophageal reflux disease without esophagitis: Secondary | ICD-10-CM | POA: Diagnosis present

## 2021-09-01 DIAGNOSIS — E785 Hyperlipidemia, unspecified: Secondary | ICD-10-CM | POA: Diagnosis present

## 2021-09-01 DIAGNOSIS — M1990 Unspecified osteoarthritis, unspecified site: Secondary | ICD-10-CM | POA: Diagnosis present

## 2021-09-01 DIAGNOSIS — I77819 Aortic ectasia, unspecified site: Secondary | ICD-10-CM | POA: Diagnosis not present

## 2021-09-01 DIAGNOSIS — N411 Chronic prostatitis: Secondary | ICD-10-CM | POA: Diagnosis present

## 2021-09-01 DIAGNOSIS — R001 Bradycardia, unspecified: Secondary | ICD-10-CM | POA: Diagnosis not present

## 2021-09-01 DIAGNOSIS — I517 Cardiomegaly: Secondary | ICD-10-CM | POA: Diagnosis not present

## 2021-09-01 DIAGNOSIS — R791 Abnormal coagulation profile: Secondary | ICD-10-CM | POA: Diagnosis not present

## 2021-09-01 DIAGNOSIS — R9431 Abnormal electrocardiogram [ECG] [EKG]: Secondary | ICD-10-CM | POA: Diagnosis not present

## 2021-09-01 DIAGNOSIS — Z91018 Allergy to other foods: Secondary | ICD-10-CM

## 2021-09-01 DIAGNOSIS — Z7901 Long term (current) use of anticoagulants: Secondary | ICD-10-CM

## 2021-09-01 DIAGNOSIS — I459 Conduction disorder, unspecified: Secondary | ICD-10-CM | POA: Diagnosis not present

## 2021-09-01 DIAGNOSIS — Z888 Allergy status to other drugs, medicaments and biological substances status: Secondary | ICD-10-CM | POA: Diagnosis not present

## 2021-09-01 DIAGNOSIS — Z87891 Personal history of nicotine dependence: Secondary | ICD-10-CM

## 2021-09-01 DIAGNOSIS — Z885 Allergy status to narcotic agent status: Secondary | ICD-10-CM

## 2021-09-01 DIAGNOSIS — J9811 Atelectasis: Secondary | ICD-10-CM | POA: Diagnosis not present

## 2021-09-01 HISTORY — DX: Atrioventricular block, complete: I44.2

## 2021-09-01 LAB — PROTIME-INR
INR: 3.2 — ABNORMAL HIGH (ref 0.8–1.2)
Prothrombin Time: 32.2 seconds — ABNORMAL HIGH (ref 11.4–15.2)

## 2021-09-01 LAB — CBC WITH DIFFERENTIAL/PLATELET
Abs Immature Granulocytes: 0.01 10*3/uL (ref 0.00–0.07)
Basophils Absolute: 0 10*3/uL (ref 0.0–0.1)
Basophils Relative: 0 %
Eosinophils Absolute: 0.3 10*3/uL (ref 0.0–0.5)
Eosinophils Relative: 3 %
HCT: 41.7 % (ref 39.0–52.0)
Hemoglobin: 14 g/dL (ref 13.0–17.0)
Immature Granulocytes: 0 %
Lymphocytes Relative: 12 %
Lymphs Abs: 0.9 10*3/uL (ref 0.7–4.0)
MCH: 31.7 pg (ref 26.0–34.0)
MCHC: 33.6 g/dL (ref 30.0–36.0)
MCV: 94.3 fL (ref 80.0–100.0)
Monocytes Absolute: 1.2 10*3/uL — ABNORMAL HIGH (ref 0.1–1.0)
Monocytes Relative: 16 %
Neutro Abs: 5 10*3/uL (ref 1.7–7.7)
Neutrophils Relative %: 69 %
Platelets: 134 10*3/uL — ABNORMAL LOW (ref 150–400)
RBC: 4.42 MIL/uL (ref 4.22–5.81)
RDW: 12.8 % (ref 11.5–15.5)
WBC: 7.4 10*3/uL (ref 4.0–10.5)
nRBC: 0 % (ref 0.0–0.2)

## 2021-09-01 LAB — BASIC METABOLIC PANEL
Anion gap: 8 (ref 5–15)
BUN: 29 mg/dL — ABNORMAL HIGH (ref 8–23)
CO2: 23 mmol/L (ref 22–32)
Calcium: 10.5 mg/dL — ABNORMAL HIGH (ref 8.9–10.3)
Chloride: 107 mmol/L (ref 98–111)
Creatinine, Ser: 0.99 mg/dL (ref 0.61–1.24)
GFR, Estimated: >60 mL/min (ref >60)
Glucose, Bld: 101 mg/dL — ABNORMAL HIGH (ref 70–99)
Potassium: 3.9 mmol/L (ref 3.5–5.1)
Sodium: 138 mmol/L (ref 135–145)

## 2021-09-01 LAB — SURGICAL PCR SCREEN
MRSA, PCR: NEGATIVE
Staphylococcus aureus: POSITIVE — AB

## 2021-09-01 LAB — MAGNESIUM: Magnesium: 1.3 mg/dL — ABNORMAL LOW (ref 1.7–2.4)

## 2021-09-01 MED ORDER — FINASTERIDE 5 MG PO TABS: 5.0000 mg | ORAL_TABLET | Freq: Every day | ORAL | Status: DC

## 2021-09-01 MED ORDER — MAGNESIUM SULFATE 2 GM/50ML IV SOLN
2.0000 g | Freq: Once | INTRAVENOUS | Status: AC
  Administered 2021-09-01: 2 g via INTRAVENOUS
  Filled 2021-09-01: qty 50

## 2021-09-01 MED ORDER — TAMSULOSIN HCL 0.4 MG PO CAPS
0.4000 mg | ORAL_CAPSULE | Freq: Every evening | ORAL | Status: DC
  Administered 2021-09-01: 0.4 mg via ORAL
  Filled 2021-09-01 (×2): qty 1

## 2021-09-01 MED ORDER — CHLORHEXIDINE GLUCONATE CLOTH 2 % EX PADS
6.0000 | MEDICATED_PAD | Freq: Every day | CUTANEOUS | Status: DC
  Administered 2021-09-02: 6 via TOPICAL

## 2021-09-01 MED ORDER — DOCUSATE SODIUM 100 MG PO CAPS
100.0000 mg | ORAL_CAPSULE | Freq: Two times a day (BID) | ORAL | Status: DC
  Administered 2021-09-01 – 2021-09-02 (×2): 100 mg via ORAL
  Filled 2021-09-01 (×3): qty 1

## 2021-09-01 MED ORDER — TRIAMTERENE-HCTZ 37.5-25 MG PO TABS
1.0000 | ORAL_TABLET | Freq: Every morning | ORAL | Status: DC
  Administered 2021-09-02 – 2021-09-03 (×2): 1 via ORAL
  Filled 2021-09-01 (×2): qty 1

## 2021-09-01 MED ORDER — ATORVASTATIN CALCIUM 80 MG PO TABS
80.0000 mg | ORAL_TABLET | Freq: Every day | ORAL | Status: DC
  Administered 2021-09-02 – 2021-09-03 (×2): 80 mg via ORAL
  Filled 2021-09-01 (×2): qty 1

## 2021-09-01 MED ORDER — MUPIROCIN 2 % EX OINT
1.0000 | TOPICAL_OINTMENT | Freq: Two times a day (BID) | CUTANEOUS | Status: DC
  Administered 2021-09-02 – 2021-09-03 (×4): 1 via NASAL
  Filled 2021-09-01: qty 22

## 2021-09-01 MED ORDER — MUPIROCIN 2 % EX OINT: 1.0000 | TOPICAL_OINTMENT | Freq: Two times a day (BID) | CUTANEOUS | Status: DC

## 2021-09-01 MED ORDER — NITROGLYCERIN 0.4 MG SL SUBL: 0.4000 mg | SUBLINGUAL_TABLET | SUBLINGUAL | Status: DC | PRN

## 2021-09-01 MED ORDER — CHLORHEXIDINE GLUCONATE 4 % EX LIQD
Freq: Once | CUTANEOUS | Status: AC
  Filled 2021-09-01: qty 15
  Filled 2021-09-01: qty 45

## 2021-09-01 NOTE — H&P (Signed)
Please see consult note to serve as H&P  Tommye Standard, PA-C

## 2021-09-01 NOTE — ED Triage Notes (Signed)
Pt arrived POV from the cardiologist who told him to come here for admission to have a pacemaker. Pt is in complete heart block but is currently asymptomatic.

## 2021-09-01 NOTE — ED Provider Notes (Addendum)
[facility] DEPARTMENT Provider Note   CSN: [id number] Arrival date & time: 09/01/21  1124     History  Chief Complaint  Patient presents with   Bradycardia    Michael Colon is a 88 y.o. male.  Patient sent here from cardiology office for complete heart block.  He is not having any symptoms but he did have a syncopal episode a few weeks ago.  He is on Coumadin for atrial flutter in which she had an ablation.  He states that he was sent by cardiology for pacemaker for tomorrow.  He is not having any chest pain or shortness of breath.  Denies any weakness or numbness or new falls.  The history is provided by the patient.       Home Medications Prior to Admission medications   Medication Sig Start Date End Date Taking? Authorizing Provider  APPLE CIDER VINEGAR PO Take 1 capsule by mouth at bedtime.   Yes [provider]  atorvastatin (LIPITOR) 80 MG tablet Take 1 tablet (80 mg total) by mouth daily. 08/24/20  Yes Venia Carbon, MD  cetirizine (ZYRTEC) 10 MG tablet Take 10 mg by mouth daily.   Yes [provider]  Cyanocobalamin (B-12 PO) Place 1 tablet under the tongue daily.   Yes [provider]  docusate sodium (COLACE) 100 MG capsule Take 100 mg by mouth 2 (two) times daily.   Yes [provider]  ibuprofen (ADVIL,MOTRIN) 200 MG tablet Take 200 mg by mouth 2 (two) times daily.   Yes [provider]  loratadine (CLARITIN) 10 MG tablet Take 10 mg by mouth daily.   Yes [provider]  metFORMIN (GLUCOPHAGE) 500 MG tablet TAKE 1 TABLET BY MOUTH  TWICE DAILY WITH MEALS Patient taking differently: Take 500 mg by mouth 2 (two) times daily. 09/22/20  Yes Venia Carbon, MD  Polyethyl Glycol-Propyl Glycol (SYSTANE ULTRA OP) Place 1 drop into both eyes daily as needed (dry eyes).   Yes [provider]  tamsulosin (FLOMAX) 0.4 MG CAPS capsule TAKE 1 CAPSULE BY MOUTH  DAILY Patient taking  differently: Take 0.4 mg by mouth every evening. 09/22/20  Yes Venia Carbon, MD  triamterene-hydrochlorothiazide (DYAZIDE) 37.5-25 MG capsule TAKE 1 CAPSULE BY MOUTH IN  THE MORNING 08/31/21  Yes Venia Carbon, MD  warfarin (COUMADIN) 5 MG tablet TAKE 1 TO 1 AND 1/2 TABLETS BY MOUTH DAILY AS DIRECTED  BY COUMADIN CLINIC Patient taking differently: Take 5-7.5 mg by mouth See admin instructions. 7.5 mg at bedtime on Sunday, Tuesday, Thursday 5 mg at bedtime on Monday, Wednesday, Friday, Saturday 01/05/21  Yes Gollan, Kathlene November, MD  finasteride (PROSCAR) 5 MG tablet TAKE 1 TABLET BY MOUTH ONCE DAILY Patient not taking: Reported on 09/01/2021 09/22/20   Venia Carbon, MD  furosemide (LASIX) 20 MG tablet Take 1 tablet (20 mg total) by mouth daily as needed (Extra pill as needed for weight 215 or higher). Once daily and extra pill as needed for weight 215 or higher. Patient not taking: Reported on 09/01/2021 02/24/20 10/09/21  Venia Carbon, MD      Allergies    Lovenox [enoxaparin], Proscar [finasteride], Latex, Nickel, Percocet [oxycodone-acetaminophen], and Tape    Review of Systems   Review of Systems  Physical Exam Updated Vital Signs BP 120/82   Pulse (!) 39   Temp 98.4 F (36.9 C) (Oral)   Resp 14   Ht 5' 10.5" (1.791 m)  Wt 90.7 kg   SpO2 95%   BMI 28.29 kg/m  Physical Exam Vitals and nursing note reviewed.  Constitutional:      General: He is not in acute distress.    Appearance: He is well-developed.  HENT:     Head: Normocephalic and atraumatic.     Nose: Nose normal.     Mouth/Throat:     Mouth: Mucous membranes are moist.  Eyes:     Extraocular Movements: Extraocular movements intact.     Conjunctiva/sclera: Conjunctivae normal.     Pupils: Pupils are equal, round, and reactive to light.  Cardiovascular:     Rate and Rhythm: Bradycardia present.     Heart sounds: No murmur heard. Pulmonary:     Effort: Pulmonary effort is normal. No respiratory distress.      Breath sounds: Normal breath sounds.  Abdominal:     Palpations: Abdomen is soft.     Tenderness: There is no abdominal tenderness.  Musculoskeletal:        General: No swelling.     Cervical back: Neck supple.  Skin:    General: Skin is warm and dry.     Capillary Refill: Capillary refill takes less than 2 seconds.  Neurological:     Mental Status: He is alert.  Psychiatric:        Mood and Affect: Mood normal.     ED Results / Procedures / Treatments   Labs (all labs ordered are listed, but only abnormal results are displayed) Labs Reviewed  CBC WITH DIFFERENTIAL/PLATELET - Abnormal; Notable for the following components:      Result Value   Platelets 134 (*)    Monocytes Absolute 1.2 (*)    All other components within normal limits  BASIC METABOLIC PANEL - Abnormal; Notable for the following components:   Glucose, Bld 101 (*)    BUN 29 (*)    Calcium 10.5 (*)    All other components within normal limits  MAGNESIUM - Abnormal; Notable for the following components:   Magnesium 1.3 (*)    All other components within normal limits  PROTIME-INR - Abnormal; Notable for the following components:   Prothrombin Time 32.2 (*)    INR 3.2 (*)    All other components within normal limits    EKG EKG Interpretation  Date/Time:  Wednesday September 01 2021 11:41:32 EDT Ventricular Rate:  48 PR Interval:    QRS Duration: 140 QT Interval:  492 QTC Calculation: 439 R Axis:   -29 Text Interpretation: bradycardia, high degree heart block v junctional rhythmn When compared with ECG of 15-Aug-2021 12:40, PREVIOUS ECG IS PRESENT Reconfirmed by Lennice Sites ([id number]) on 09/01/2021 12:03:51 PM  Radiology DG Chest Portable 1 View  Result Date: 09/01/2021 CLINICAL DATA:  Heart block EXAM: PORTABLE CHEST 1 VIEW COMPARISON:  09/16/2019 FINDINGS: Cardiomegaly. Both lungs are clear. The visualized skeletal structures are unremarkable. IMPRESSION: Cardiomegaly without acute abnormality of the  lungs in AP portable projection. Electronically Signed   By: Delanna Ahmadi M.D.   On: 09/01/2021 12:42    Procedures .Critical Care  Performed by: Lennice Sites, DO Authorized by: Lennice Sites, DO   Critical care provider statement:    Critical care time (minutes):  35   Critical care was necessary to treat or prevent imminent or life-threatening deterioration of the following conditions: complete heart block.   Critical care was time spent personally by me on the following activities:  Blood draw for specimens, development of treatment plan with  patient or surrogate, discussions with primary provider, evaluation of patient's response to treatment, examination of patient, obtaining history from patient or surrogate, ordering and performing treatments and interventions, ordering and review of laboratory studies, ordering and review of radiographic studies, pulse oximetry, review of old charts and re-evaluation of patient's condition   Care discussed with: admitting provider       Medications Ordered in ED Medications  magnesium sulfate IVPB 2 g 50 mL (0 g Intravenous Stopped 09/01/21 1416)    ED Course/ Medical Decision Making/ A&P                           Medical Decision Making Amount and/or Complexity of Data Reviewed Labs: ordered. Radiology: ordered.  Risk Prescription drug management. Decision regarding hospitalization.   BUNNY LOWDERMILK is here with concern for complete heart block from cardiology office.  Per my review of cardiology note he had EKG there that was consistent with complete heart block.  EKG here today appears consistent with the same.  He is not on any rate control medications.  He is on Coumadin.  Syncopal episode a few weeks ago.  He is not having any syncope symptoms or lightheadedness or major symptoms at this time.  I talked with cardiology team and it appears the plan is likely for pacemaker.  We will get medical screening labs of CBC, BMP, INR, chest  x-ray.  He is hemodynamically stable at this time.  We will put him on pads and anticipate admission to cardiology team.  Per my review and interpretation of labs, no significant findings.    Reading cardiology notes they now suspect may be not complete heart block.  Still awaiting their final disposition.  This chart was dictated using voice recognition software.  Despite best efforts to proofread,  errors can occur which can change the documentation meaning.    Final Clinical Impression(s) / ED Diagnoses Final diagnoses:  Nonspecific abnormal electrocardiogram (ECG) (EKG)    Rx / DC Orders ED Discharge Orders     None         Lennice Sites, DO 09/01/21 1252    Lennice Sites, DO 09/01/21 1507

## 2021-09-01 NOTE — ED Notes (Signed)
Pt called out yelling at RN and registration that his doctor and PA said he can go home. RN explained that that's what the cardiologist said and not our EDP. Schlossman MD made aware and is going to speak to the pt.

## 2021-09-01 NOTE — Patient Instructions (Signed)
Medication Instructions:  Your physician recommends that you continue on your current medications as directed. Please refer to the Current Medication list given to you today.  *If you need a refill on your cardiac medications before your next appointment, please call your pharmacy*   Lab Work: None ordered If you have labs (blood work) drawn today and your tests are completely normal, you will receive your results only by: [place] (if you have MyChart) OR A paper copy in the mail If you have any lab test that is abnormal or we need to change your treatment, we will call you to review the results.   Testing/Procedures: None ordered   Follow-Up: At [facility], you and your health needs are our priority.  As part of our continuing mission to provide you with exceptional heart care, we have created designated Provider Care Teams.  These Care Teams include your primary Cardiologist (physician) and Advanced Practice Providers (APPs -  Physician Assistants and Nurse Practitioners) who all work together to provide you with the care you need, when you need it.  We recommend signing up for the patient portal called "MyChart".  Sign up information is provided on this After Visit Summary.  MyChart is used to connect with patients for Virtual Visits (Telemedicine).  Patients are able to view lab/test results, encounter notes, upcoming appointments, etc.  Non-urgent messages can be sent to your provider as well.   To learn more about what you can do with MyChart, go to [email or web address].    Your next appointment:   As planned  The format for your next appointment:   In Person  Provider:   You may see Ida Rogue, MD or one of the following Advanced Practice Providers on your designated Care Team:   Murray Hodgkins, NP Christell Faith, PA-C Cadence Kathlen Mody, PA-C Gerrie Nordmann, NP   Other Instructions We recommend that you get a ride and proceed to [facility]  asap. [address] [place], [place] [address]  Important Information About Sugar

## 2021-09-01 NOTE — ED Notes (Signed)
Kymere Fullington son [phone] requesting an update on the patient

## 2021-09-01 NOTE — Consult Note (Addendum)
Cardiology consult   Patient ID: Michael Colon MRN: [id number]; DOB: 1930/10/02   Admission date: 09/01/2021  PCP:  Venia Carbon, MD   [facility] Cardiologist:  Ida Rogue, MD   {    Chief Complaint:  CHB  Patient Profile:   Michael Colon is a 88 y.o. male with AFlutter (reportedly ablated 2014) AFib is also mentioned, TIA, HTN,, HLD, varicose veins (ablated) w/chronic LE, OSA and baseline conduction system disease who is being seen 09/01/2021 for the evaluation of advanced heart block.  History of Present Illness:   [name] in review of record, looks like he has seen EP though very remotely having an AFlutter ablation and seems after a monitor that noted  sinus rhythm with pauses up to 2.86 seconds, bradycardia with rates into the 20s to 30s bpm overnight and 30s to 50s bpm during the day, at that time, EP did not recommend pacing.  I can not find the record/notes, or monitor of this, seems perhaps > 10 years ago.  He had an ER visit 08/15/21 after a fall that was described as mechanical, having hit his head and on warfarin, with laceration/bleeding went to  the ER.  He denied LOC.  Head imaging was OK, incidental findings of thyroid abnormality suggestive of goiter, (and reportedly hs subsequently undergone ultrasound of the thyroid and FNA), he did have an abnormal EKG and rec for cards follow up  He was seen in the office today for ER follow up visit his EKG done felt to be c/w CHB and with no nodal blocking agents on board advised he be further evaluated at the hospital with likely need for PPM.  LABS K+ 3.9 Mag 1.3 (ER MD is addressing) BUN/Creat 29/0.99 WBC 7.4 H/H 14/41 Plts 134 CXR clear  INR 3.2  He feels quite well. He denies any CP, palpitations or cardiac awareness of any kind. He recalls a decade or more ago, and sine then his HR has been routinely 30's-40's, some slower and faster. 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 He denies any changes in his exertional capacity, ability to do his ADLs He denies any dizzy spells, no near syncope or syncope.  On 8/13 he had finished his sermon, there were 3 steps to come down to walk out, and had stepped down 2 steps and the person in front of his hesitated and he lost his balance and fell backwards.  He did not feel dizzy ,lightheaded, he know he was falling but unable to stop it.  He reports that  he naps, perhaps more often in the last few years, but does not have trouble staying awake.  He feels if anything he has more energy/doing more activities then he was 2 years ago, has started 2 new ministries/outreach programs that he is very involved in    Past Medical History:  Diagnosis Date   Asthma    Atrial flutter ([employer]) 2007   Band keratopathy    Benign prostatic hypertrophy    Chronic ear infection    Chronic kidney disease    Chronic prostatitis    Dental bridge present    permanent - upper   Diabetes mellitus    Elbow stiffness, left    s/p fracture many yrs ago.  arm does not straighten   GERD (gastroesophageal reflux disease)    laryngeal involvement   Hyperlipidemia    Hypertension  Obstructive sleep apnea    CPAP-9   Osteoarthrosis, localized, primary, knee    post-traumatic   Prostatitis    Stroke [facility])    TIA   Tachy-brady syndrome ([place])    UTI (urinary tract infection)     Past Surgical History:  Procedure Laterality Date   APPENDECTOMY     BELPHAROPTOSIS REPAIR     Dr [name]---didn't resolve weepy eye and eyelid drooping   CARDIOVERSION  04/13/2010   CATARACT EXTRACTION, BILATERAL  2009   Chest pain  8/12   Stress test benign   ESOPHAGEAL DILATION  05/26/2016   Procedure: ESOPHAGEAL DILATION;  Surgeon: Lucilla Lame, MD;  Location: [facility];  Service: Endoscopy;;   ESOPHAGOGASTRODUODENOSCOPY (EGD) WITH PROPOFOL N/A 05/26/2016   Procedure: ESOPHAGOGASTRODUODENOSCOPY (EGD)  WITH PROPOFOL;  Surgeon: Lucilla Lame, MD;  Location: [facility];  Service: Endoscopy;  Laterality: N/A;  Diabetic - oral meds sleep apnea   EYE SURGERY     FRACTURE SURGERY Left    elbow   KNEE SURGERY  1998   plate after fracture, then removed for infection   left elbow surgery     MASTOIDECTOMY  8/08   Dr Idelle Crouch   RHINOPLASTY  5/10   and septoplasty   SUBACROMIAL DECOMPRESSION Right 2005   Arthroscopic (for rotator cuff and biceps tendon ruptures)   TEAR DUCT PROBING  11/13   Dr Vickki Muff   TOTAL KNEE ARTHROPLASTY Left 09/01/2014   Procedure: TOTAL KNEE ARTHROPLASTY;  Surgeon: Dereck Leep, MD;  Location: [facility];  Service: Orthopedics;  Laterality: Left;   TRIGGER FINGER RELEASE Left 02/25/2015   Procedure: LEFT LONG TRIGGER RELEASE;  Surgeon: Dereck Leep, MD;  Location: [facility];  Service: Orthopedics;  Laterality: Left;   VENOUS ABLATION       Medications Prior to Admission: Prior to Admission medications   Medication Sig Start Date End Date Taking? Authorizing Provider  APPLE CIDER VINEGAR PO Take by mouth.    [provider]  atorvastatin (LIPITOR) 80 MG tablet Take 1 tablet (80 mg total) by mouth daily. 08/24/20   Venia Carbon, MD  cetirizine (ZYRTEC) 10 MG tablet Take 10 mg by mouth daily.    [provider]  Cyanocobalamin (B-12) 2500 MCG SUBL Place under the tongue.    [provider]  docusate sodium (COLACE) 100 MG capsule Take 100 mg by mouth 2 (two) times daily.    [provider]  famotidine (PEPCID) 20 MG tablet Take 20 mg by mouth daily.    [provider]  finasteride (PROSCAR) 5 MG tablet TAKE 1 TABLET BY MOUTH ONCE DAILY 09/22/20   Venia Carbon, MD  furosemide (LASIX) 20 MG tablet Take 1 tablet (20 mg total) by mouth daily as needed (Extra pill as needed for weight 215 or higher). Once daily and extra pill as needed for weight 215 or higher. Patient not taking: Reported on 09/01/2021 02/24/20  11/30/20  [name] I, MD  glucose blood (BAYER CONTOUR TEST) test strip USE STRIP TO TEST BLOOD SUGAR ONCE DAILY. Dx Code: E11.40 02/25/19   Venia Carbon, MD  ibuprofen (ADVIL,MOTRIN) 200 MG tablet Take 400 mg by mouth 2 (two) times daily.     [provider]  loratadine (CLARITIN) 10 MG tablet Take 10 mg by mouth daily.    [provider]  metFORMIN (GLUCOPHAGE) 500 MG tablet TAKE 1 TABLET BY MOUTH  TWICE DAILY WITH MEALS Patient taking differently: Take 500 mg by  mouth daily at 2 PM. 09/22/20   Venia Carbon, MD  Microlet Lancets MISC Use to test twice daily or as directed 02/15/19   [name] I, MD  tamsulosin (FLOMAX) 0.4 MG CAPS capsule TAKE 1 CAPSULE BY MOUTH  DAILY Patient not taking: Reported on 09/01/2021 09/22/20   Venia Carbon, MD  triamterene-hydrochlorothiazide (DYAZIDE) 37.5-25 MG capsule TAKE 1 CAPSULE BY MOUTH IN  THE MORNING 08/31/21   [name] I, MD  warfarin (COUMADIN) 5 MG tablet TAKE 1 TO 1 AND 1/2 TABLETS BY MOUTH DAILY AS DIRECTED  BY COUMADIN CLINIC 01/05/21   Minna Merritts, MD     Allergies:    Allergies  Allergen Reactions   Enoxaparin Hives   Enoxaparin Sodium Hives   Latex Rash    Has trouble with BANDAIDS that have been left on for more than 24 hours. Prefers paper tape.   Nickel Rash   Percocet [Oxycodone-Acetaminophen] Rash   Tape Rash and Other (See Comments)    Sensitivity     Social History:   Social History   Socioeconomic History   Marital status: Widowed    Spouse name: Not on file   Number of children: 2   Years of education: Not on file   Highest education level: Not on file  Occupational History   Occupation: Pastor    Comment: Retired--[employer]  Tobacco Use   Smoking status: Former    Packs/day: 1.00    Years: 0.00    Total pack years: 0.00    Types: Cigarettes    Quit date: 01/03/1969    Years since quitting: 52.6    Passive exposure: Never   Smokeless tobacco: Never  Vaping Use    Vaping Use: Never used  Substance and Sexual Activity   Alcohol use: No    Alcohol/week: 1.0 standard drink of alcohol    Types: 1 Glasses of wine per week    Comment: rare wine. 1-2x/yr.   Drug use: No   Sexual activity: Not on file  Other Topics Concern   Not on file  Social History Narrative   Has living will   Health care POA-- son Shanon Brow   Has DNR order from the past---form redone 06/25/10   Probably no feeding tube if cognitively unaware   Social Determinants of Health   Financial Resource Strain: Not on file  Food Insecurity: Not on file  Transportation Needs: Not on file  Physical Activity: Not on file  Stress: Not on file  Social Connections: Not on file  Intimate Partner Violence: Not on file    Family History:   The patient's family history includes Colon cancer in his father; Diabetes in his father; Hypertension in his father; Other in his mother. There is no history of Heart attack or Stroke.    ROS:  Please see the history of present illness.  All other ROS reviewed and negative.     Physical Exam/Data:   Vitals:   09/01/21 1145 09/01/21 1155  BP:  131/66  Pulse: (!) 47   Resp: 18   Temp: 98.4 F (36.9 C)   TempSrc: Oral   SpO2: 99%   Weight:  90.7 kg  Height:  5' 10.5" (1.791 m)   No intake or output data in the 24 hours ending 09/01/21 1219    09/01/2021   11:55 AM 09/01/2021    8:31 AM 08/25/2021   10:15 AM  Last 3 Weights  Weight (lbs) 200 lb 202 lb 6.4 oz  203 lb  Weight (kg) 90.719 kg 91.808 kg 92.08 kg     Body mass index is 28.29 kg/m.  General:  Well nourished, well developed, in no acute distress HEENT: normal Neck: no JVD Vascular: No carotid bruits   Cardiac:  RRR; no murmurs, gallops or rubs Lungs:  CTA b/l, no wheezing, rhonchi or rales  Abd: soft, nontender, no hepatomegaly  Ext: no edema Musculoskeletal:  No deformities Skin: warm and dry  Neuro:  no focal abnormalities noted Psych:  Normal affect    EKG:  personally  reviewed with Dr. Caryl Comes ER: V rate is 48, R-R is irregular (not including PVC), some suggestion of Mobitz one, RBBB (old)  Office: harder to see with some baseline noise, V rate 36, RBBB, irregular  OLD 08/15/21, neither A or V are regular, again some suggestion of Mobitz one, 55bpm 09/23/20: SR , marked 1st degree AVblock, 440-48m, RBBB, LAD  Relevant CV Studies:   2D echo 10/15/2019: 1. Left ventricular ejection fraction, by estimation, is 65 to 70%. The  left ventricle has normal function. Left ventricular endocardial border  not optimally defined to evaluate regional wall motion. There is severe  asymmetric left ventricular  hypertrophy of the basal-septal segment. Left ventricular diastolic  parameters are indeterminate.   2. Right ventricular systolic function is normal. The right ventricular  size is mildly enlarged. There is mildly elevated pulmonary artery  systolic pressure.   3. Left atrial size was moderately dilated.   4. The mitral valve was not well visualized. Trivial mitral valve  regurgitation. No evidence of mitral stenosis.   5. The aortic valve is tricuspid. There is mild calcification of the  aortic valve. There is moderate thickening of the aortic valve. Aortic  valve regurgitation is not visualized. Mild to moderate aortic valve  sclerosis/calcification is present, without  any evidence of aortic stenosis.   6. There is mild dilatation of the ascending aorta, measuring 38 mm.   7. The inferior vena cava is dilated in size with >50% respiratory  variability, suggesting right atrial pressure of 8 mmHg.  __________   LCarlton AdamMPI 04/15/2015: Pharmacological myocardial perfusion imaging study with no significant  ischemia Normal wall motion, EF estimated at 67% No EKG changes concerning for ischemia at peak stress or in recovery. Low risk scan __________   Calcium score 04/06/2015: IMPRESSION: 1. Coronary calcium score of 1349. This was 849percentile for  age and sex matched control. Further ischemic work up and an aggressive risk factor modification is recommended. 2. Dilated ascending aorta on the axial images. A dedicated chest CTA or MRA is recommended for further evaluation.    Laboratory Data:  High Sensitivity Troponin:  No results for input(s): "TROPONINIHS" in the last 720 hours.    ChemistryNo results for input(s): "NA", "K", "CL", "CO2", "GLUCOSE", "BUN", "CREATININE", "CALCIUM", "MG", "GFRNONAA", "GFRAA", "ANIONGAP" in the last 168 hours.  No results for input(s): "PROT", "ALBUMIN", "AST", "ALT", "ALKPHOS", "BILITOT" in the last 168 hours. Lipids No results for input(s): "CHOL", "TRIG", "HDL", "LABVLDL", "LDLCALC", "CHOLHDL" in the last 168 hours. HematologyNo results for input(s): "WBC", "RBC", "HGB", "HCT", "MCV", "MCH", "MCHC", "RDW", "PLT" in the last 168 hours. Thyroid No results for input(s): "TSH", "FREET4" in the last 168 hours. BNPNo results for input(s): "BNP", "PROBNP" in the last 168 hours.  DDimer No results for input(s): "DDIMER" in the last 168 hours.   Radiology/Studies:  No results found.   Assessment and Plan:   Longstanding conduction system disease Reviewed  EKGs and telemetry with Dr. Caryl Comes, felt to be variable conduction with Mobitz one, not convinced of CHB Rates 30's-60's I do note that with movement in the bed and more animated discussions taht his HR does increase and gains 1;1 conduction Baseline RBBB No symptoms of bradycardia  Dr. Caryl Comes will d/w Dr. Rockey Situ and Dr. Caryl Comes, suspect the patient can be discharged to home  Dr. Caryl Comes discussed both rational for sending to ER and for sending him home. The patient would be agreeable to pacing or not, if able to avoid a procedure he would prefer it, again stating he feels well.  BP has been stable Discussed with the patient symptoms that he should be aware of and seek attention for.  Await a final decision/disposition  ADDEND After further  review of telemetry tracing there is concern for AV dissociation and CHB. Despite no symptoms, felt best/safest to plan for PPM, we will proceed as the schedule allows. Hold warfarin, no bridging neccessary  Risk Assessment/Risk Scores:    For questions or updates, please contact [place] Please consult [email or web address] for contact info under     Signed, Baldwin Jamaica, PA-C  09/01/2021 12:19 PM   Trifascicular block, profound first-degree AV block, right bundle branch block/left axis deviation  Intermittent second-degree AV block type I  Sinus pauses  Intermittent complete heart block  Fall  mechanical ?   Atrial flutter/fibrillation  TIA ?  2014   no records available  Supratherapeutic INR  I saw this patient in 2014 for some in his bradycardia in the setting of right bundle branch block left anterior fascicular block and much more modest first-degree AV block which improved with discontinuation of amlodipine.  His fall 8/13 seemingly was mechanical as with his trip for 5 months ago.  He has noted no change in exercise tolerance over the last couple of years and his major limitation is orthopedic.  Review of the tracings that prompted the transfer suggested that there was some varied antegrade conduction through the AV node, subsequent ECG in the ER demonstrated grouped beating consistent with Mobitz 1 and in the absence of symptoms the recommendation was initially to go home without pacing.  Subsequent ongoing monitoring demonstrated intermittent complete heart block with the same right bundle branch block i.e. junctional escape beats which have been noted previously.  However, in the context of trifascicular block intermittent complete heart block is indication for pacing although most of those patients probably had infrahisian, ie nonjunctional escape.  Still, I think it is reasonable to proceed with pacing and have made that recommendation with which the  patient is in agreement.  His INR is 3.2, and as the procedure is not emergent, we will admit hold his Coumadin and then proceed as the INR drifts below 3.  He has a possible history of a TIA so doing his pacemaker while his INR is therapeutic I think it is safest.

## 2021-09-01 NOTE — ED Notes (Signed)
RN updated Son, Ronell Duffus, regarding pt plan of care.

## 2021-09-01 NOTE — Plan of Care (Signed)
  Problem: Education: Goal: Knowledge of General Education information will improve Description: Including pain rating scale, medication(s)/side effects and non-pharmacologic comfort measures Outcome: Progressing   Problem: Health Behavior/Discharge Planning: Goal: Ability to manage health-related needs will improve Outcome: Progressing   Problem: Clinical Measurements: Goal: Cardiovascular complication will be avoided Outcome: Progressing   Problem: Safety: Goal: Ability to remain free from injury will improve Outcome: Progressing   

## 2021-09-01 NOTE — Progress Notes (Signed)
Pt admitted to [facility], VS as per flow. Pt oriented to 6E processes. SB on telemetry. Pt asymptomatic. NPO @ midnight for PPM in AM. All questions and concerns addressed. Call bell placed within reach, will continue to monitor and maintain safety.

## 2021-09-02 ENCOUNTER — Inpatient Hospital Stay (HOSPITAL_COMMUNITY): Payer: Medicare Other

## 2021-09-02 ENCOUNTER — Inpatient Hospital Stay (HOSPITAL_COMMUNITY)
Admission: EM | Disposition: A | Discharge status: Home / Self Care | Payer: Self-pay | Source: Home / Self Care | Attending: Internal Medicine

## 2021-09-02 DIAGNOSIS — R9431 Abnormal electrocardiogram [ECG] [EKG]: Secondary | ICD-10-CM | POA: Diagnosis not present

## 2021-09-02 DIAGNOSIS — Z950 Presence of cardiac pacemaker: Secondary | ICD-10-CM | POA: Insufficient documentation

## 2021-09-02 DIAGNOSIS — I442 Atrioventricular block, complete: Secondary | ICD-10-CM | POA: Diagnosis not present

## 2021-09-02 HISTORY — PX: PACEMAKER IMPLANT: EP1218

## 2021-09-02 LAB — BASIC METABOLIC PANEL
Anion gap: 4 — ABNORMAL LOW (ref 5–15)
BUN: 26 mg/dL — ABNORMAL HIGH (ref 8–23)
CO2: 25 mmol/L (ref 22–32)
Calcium: 10 mg/dL (ref 8.9–10.3)
Chloride: 109 mmol/L (ref 98–111)
Creatinine, Ser: 0.91 mg/dL (ref 0.61–1.24)
GFR, Estimated: >60 mL/min (ref >60)
Glucose, Bld: 125 mg/dL — ABNORMAL HIGH (ref 70–99)
Potassium: 3.6 mmol/L (ref 3.5–5.1)
Sodium: 138 mmol/L (ref 135–145)

## 2021-09-02 LAB — ECHOCARDIOGRAM COMPLETE
Area-P 1/2: 2.82 cm2
Calc EF: 58.5 %
Height: 70 in
S' Lateral: 2.8 cm
Single Plane A2C EF: 54.6 %
Single Plane A4C EF: 59.2 %
Weight: 3169.6 oz

## 2021-09-02 LAB — PROTIME-INR
INR: 3.4 — ABNORMAL HIGH (ref 0.8–1.2)
Prothrombin Time: 34.4 seconds — ABNORMAL HIGH (ref 11.4–15.2)

## 2021-09-02 LAB — MAGNESIUM: Magnesium: 1.5 mg/dL — ABNORMAL LOW (ref 1.7–2.4)

## 2021-09-02 SURGERY — PACEMAKER IMPLANT

## 2021-09-02 MED ORDER — MIDAZOLAM HCL 5 MG/5ML IJ SOLN
INTRAMUSCULAR | Status: DC | PRN
  Administered 2021-09-02: 1 mg via INTRAVENOUS

## 2021-09-02 MED ORDER — CEFAZOLIN SODIUM-DEXTROSE 2-4 GM/100ML-% IV SOLN
INTRAVENOUS | Status: AC
  Filled 2021-09-02: qty 100

## 2021-09-02 MED ORDER — LIDOCAINE HCL 1 % IJ SOLN
INTRAMUSCULAR | Status: AC
  Filled 2021-09-02: qty 60

## 2021-09-02 MED ORDER — ACETAMINOPHEN 325 MG PO TABS
650.0000 mg | ORAL_TABLET | ORAL | Status: DC | PRN
  Administered 2021-09-02 – 2021-09-03 (×2): 650 mg via ORAL
  Filled 2021-09-02 (×2): qty 2

## 2021-09-02 MED ORDER — VITAMIN K1 10 MG/ML IJ SOLN
1.0000 mg | Freq: Once | INTRAVENOUS | Status: AC
  Administered 2021-09-02: 1 mg via INTRAVENOUS
  Filled 2021-09-02: qty 0.1

## 2021-09-02 MED ORDER — LIDOCAINE HCL (PF) 1 % IJ SOLN
INTRAMUSCULAR | Status: DC | PRN
  Administered 2021-09-02: 50 mL

## 2021-09-02 MED ORDER — SODIUM CHLORIDE 0.9 % IV SOLN: 250.0000 mL | INTRAVENOUS | Status: DC

## 2021-09-02 MED ORDER — SODIUM CHLORIDE 0.9% FLUSH: 3.0000 mL | INTRAVENOUS | Status: DC | PRN

## 2021-09-02 MED ORDER — SODIUM CHLORIDE 0.9 % IV SOLN
80.0000 mg | INTRAVENOUS | Status: AC
  Administered 2021-09-02: 80 mg

## 2021-09-02 MED ORDER — SODIUM CHLORIDE 0.9 % IV SOLN
INTRAVENOUS | Status: AC
  Filled 2021-09-02: qty 2

## 2021-09-02 MED ORDER — FENTANYL CITRATE (PF) 100 MCG/2ML IJ SOLN
INTRAMUSCULAR | Status: AC
  Filled 2021-09-02: qty 2

## 2021-09-02 MED ORDER — ZOLPIDEM TARTRATE 5 MG PO TABS: 5.0000 mg | ORAL_TABLET | Freq: Every evening | ORAL | Status: DC | PRN

## 2021-09-02 MED ORDER — MIDAZOLAM HCL 5 MG/5ML IJ SOLN
INTRAMUSCULAR | Status: AC
  Filled 2021-09-02: qty 5

## 2021-09-02 MED ORDER — CHLORHEXIDINE GLUCONATE 4 % EX LIQD
60.0000 mL | Freq: Once | CUTANEOUS | Status: AC
Start: 2021-09-02 — End: 2021-09-02
  Filled 2021-09-02: qty 60

## 2021-09-02 MED ORDER — HEPARIN (PORCINE) IN NACL 1000-0.9 UT/500ML-% IV SOLN
INTRAVENOUS | Status: AC
  Filled 2021-09-02: qty 500

## 2021-09-02 MED ORDER — MAGNESIUM SULFATE 2 GM/50ML IV SOLN
2.0000 g | Freq: Once | INTRAVENOUS | Status: AC
  Administered 2021-09-02: 2 g via INTRAVENOUS
  Filled 2021-09-02: qty 50

## 2021-09-02 MED ORDER — FENTANYL CITRATE (PF) 100 MCG/2ML IJ SOLN
INTRAMUSCULAR | Status: DC | PRN
  Administered 2021-09-02: 25 ug via INTRAVENOUS

## 2021-09-02 MED ORDER — ALPRAZOLAM 0.25 MG PO TABS: 0.2500 mg | ORAL_TABLET | Freq: Two times a day (BID) | ORAL | Status: DC | PRN

## 2021-09-02 MED ORDER — SODIUM CHLORIDE 0.9% FLUSH
3.0000 mL | Freq: Two times a day (BID) | INTRAVENOUS | Status: DC
  Administered 2021-09-02: 3 mL via INTRAVENOUS

## 2021-09-02 MED ORDER — ONDANSETRON HCL 4 MG/2ML IJ SOLN
4.0000 mg | Freq: Four times a day (QID) | INTRAMUSCULAR | Status: DC | PRN
Start: 2021-09-02 — End: 2021-09-03

## 2021-09-02 MED ORDER — SODIUM CHLORIDE 0.9 % IV SOLN: INTRAVENOUS | Status: DC

## 2021-09-02 MED ORDER — CEFAZOLIN SODIUM-DEXTROSE 2-4 GM/100ML-% IV SOLN
2.0000 g | INTRAVENOUS | Status: AC
  Administered 2021-09-02: 2 g via INTRAVENOUS

## 2021-09-02 MED ORDER — CHLORHEXIDINE GLUCONATE 4 % EX LIQD
60.0000 mL | Freq: Once | CUTANEOUS | Status: AC
Start: 2021-09-02 — End: 2021-09-02
  Administered 2021-09-02: 4 via TOPICAL
  Filled 2021-09-02: qty 60

## 2021-09-02 SURGICAL SUPPLY — 11 items
CABLE SURGICAL S-101-97-12 (CABLE) ×1 IMPLANT
CATH SELECT PACE 669183 (CATHETERS) IMPLANT
CUTTER LV DELIVERY CATHETER 7 (MISCELLANEOUS) IMPLANT
LEAD INGEVITY 7841 52 (Lead) IMPLANT
LEAD INGEVITY 7842 59 (Lead) IMPLANT
PACEMAKER ACCOLADE DR-EL (Pacemaker) IMPLANT
PAD DEFIB RADIO PHYSIO CONN (PAD) ×1 IMPLANT
SHEATH 7FR PRELUDE SNAP 13 (SHEATH) IMPLANT
SHEATH 8FR PRELUDE SNAP 13 (SHEATH) IMPLANT
TRAY PACEMAKER INSERTION (PACKS) ×1 IMPLANT
WIRE HI TORQ VERSACORE-J 145CM (WIRE) IMPLANT

## 2021-09-02 NOTE — Care Management (Signed)
  Transition of Care (TOC) Screening Note   Patient Details  Name: Michael Colon Date of Birth: [birth year]   Transition of Care [facility]) CM/SW Contact:    Bethena Roys, RN Phone Number: 09/02/2021, 2:36 PM    Transition of Care Department [facility]) has reviewed the patient and no TOC needs have been identified at this time. We will continue to monitor patient advancement through interdisciplinary progression rounds. If new patient transition needs arise, please place a TOC consult.

## 2021-09-02 NOTE — Progress Notes (Signed)
Rounding Note    Patient Name: Michael Colon Date of Encounter: 09/02/2021  [facility] Cardiologist: Ida Rogue, MD   Subjective   Feels well, monitor rang all night  Inpatient Medications    Scheduled Meds:  atorvastatin  80 mg Oral Daily   Chlorhexidine Gluconate Cloth  6 each Topical Daily   docusate sodium  100 mg Oral BID   mupirocin ointment  1 Application Nasal BID   sodium chloride flush  3 mL Intravenous Q12H   tamsulosin  0.4 mg Oral QPM   triamterene-hydrochlorothiazide  1 tablet Oral q morning   Continuous Infusions:  phytonadione (VITAMIN K) 1 mg in dextrose 5 % 50 mL IVPB     PRN Meds: nitroGLYCERIN   Vital Signs    Vitals:   09/01/21 1808 09/01/21 1945 09/02/21 0008 09/02/21 0452  BP: (!) 177/85 130/76 126/65 112/60  Pulse: (!) 54 99 (!) 40 (!) 42  Resp: '16 16 16 16  '$ Temp: 97.7 F (36.5 C) 97.9 F (36.6 C) 97.6 F (36.4 C) 97.6 F (36.4 C)  TempSrc: Oral Oral Oral Oral  SpO2: 97% 99% 98% 99%  Weight: 89.9 kg     Height: '5\' 10"'$  (1.778 m)       Intake/Output Summary (Last 24 hours) at 09/02/2021 4970 Last data filed at 09/02/2021 0006 Gross per 24 hour  Intake 284.38 ml  Output 200 ml  Net 84.38 ml      09/01/2021    6:08 PM 09/01/2021   11:55 AM 09/01/2021    8:31 AM  Last 3 Weights  Weight (lbs) 198 lb 1.6 oz 200 lb 202 lb 6.4 oz  Weight (kg) 89.858 kg 90.719 kg 91.808 kg      Telemetry    Unchanged, variable heart block including CHB, rates stable mostly 40's - Personally Reviewed  ECG    No new EKGs - Personally Reviewed  Physical Exam   GEN: No acute distress.   Neck: No JVD Cardiac: irregular, bradycardicno murmurs, rubs, or gallops.  Respiratory: CTA b/l. GI: Soft, nontender, non-distended  MS: No edema; No deformity. Neuro:  Nonfocal  Psych: Normal affect   Labs    High Sensitivity Troponin:  No results for input(s): "TROPONINIHS" in the last 720 hours.   Chemistry Recent Labs  Lab  09/01/21 1139 09/02/21 0311  NA 138 138  K 3.9 3.6  CL 107 109  CO2 23 25  GLUCOSE 101* 125*  BUN 29* 26*  CREATININE 0.99 0.91  CALCIUM 10.5* 10.0  MG 1.3* 1.5*  GFRNONAA >60 >60  ANIONGAP 8 4*    Lipids No results for input(s): "CHOL", "TRIG", "HDL", "LABVLDL", "LDLCALC", "CHOLHDL" in the last 168 hours.  Hematology Recent Labs  Lab 09/01/21 1139  WBC 7.4  RBC 4.42  HGB 14.0  HCT 41.7  MCV 94.3  MCH 31.7  MCHC 33.6  RDW 12.8  PLT 134*   Thyroid No results for input(s): "TSH", "FREET4" in the last 168 hours.  BNPNo results for input(s): "BNP", "PROBNP" in the last 168 hours.  DDimer No results for input(s): "DDIMER" in the last 168 hours.   Radiology    DG Chest Portable 1 View  Result Date: 09/01/2021 CLINICAL DATA:  Heart block EXAM: PORTABLE CHEST 1 VIEW COMPARISON:  09/16/2019 FINDINGS: Cardiomegaly. Both lungs are clear. The visualized skeletal structures are unremarkable. IMPRESSION: Cardiomegaly without acute abnormality of the lungs in AP portable projection. Electronically Signed   By: Jamse Mead.D.  On: 09/01/2021 12:42    Cardiac Studies   2D echo 10/15/2019: 1. Left ventricular ejection fraction, by estimation, is 65 to 70%. The  left ventricle has normal function. Left ventricular endocardial border  not optimally defined to evaluate regional wall motion. There is severe  asymmetric left ventricular  hypertrophy of the basal-septal segment. Left ventricular diastolic  parameters are indeterminate.   2. Right ventricular systolic function is normal. The right ventricular  size is mildly enlarged. There is mildly elevated pulmonary artery  systolic pressure.   3. Left atrial size was moderately dilated.   4. The mitral valve was not well visualized. Trivial mitral valve  regurgitation. No evidence of mitral stenosis.   5. The aortic valve is tricuspid. There is mild calcification of the  aortic valve. There is moderate thickening of the aortic  valve. Aortic  valve regurgitation is not visualized. Mild to moderate aortic valve  sclerosis/calcification is present, without  any evidence of aortic stenosis.   6. There is mild dilatation of the ascending aorta, measuring 38 mm.   7. The inferior vena cava is dilated in size with >50% respiratory  variability, suggesting right atrial pressure of 8 mmHg.  __________   Carlton Adam MPI 04/15/2015: Pharmacological myocardial perfusion imaging study with no significant  ischemia Normal wall motion, EF estimated at 67% No EKG changes concerning for ischemia at peak stress or in recovery. Low risk scan __________   Calcium score 04/06/2015: IMPRESSION: 1. Coronary calcium score of 1349. This was 90 percentile for age and sex matched control. Further ischemic work up and an aggressive risk factor modification is recommended. 2. Dilated ascending aorta on the axial images. A dedicated chest CTA or MRA is recommended for further evaluation.      Patient Profile     88 y.o. male with AFlutter (reportedly ablated 2014) AFib is also mentioned, TIA, HTN,, HLD, varicose veins (ablated) w/chronic LE, OSA admitted with progression of his conduction sytem disease to CHB  Assessment & Plan    Longstanding hx of conduction system disease and bradycardia Progression to CHB No obvious symptoms No syncope Will need a PPM with no reversible causes  Will update his echo  INR today 3.4 Plan Vit K '1mg'$  IV and implant this afternoon Dr. Lovena Le has seen and examined the patient Pt remains agreeable to proceed  2. Paroxysmal AFib CHA2DS2Vasc is 6 On warfarin Held  3. HTN Home meds  For questions or updates, please contact [place] Please consult [email or web address] for contact info under        Signed, Baldwin Jamaica, PA-C  09/02/2021, 8:08 AM

## 2021-09-03 ENCOUNTER — Encounter (HOSPITAL_COMMUNITY): Payer: Self-pay | Admitting: Cardiology

## 2021-09-03 ENCOUNTER — Inpatient Hospital Stay (HOSPITAL_COMMUNITY): Payer: Medicare Other

## 2021-09-03 ENCOUNTER — Other Ambulatory Visit: Payer: Self-pay | Admitting: Physician Assistant

## 2021-09-03 DIAGNOSIS — I442 Atrioventricular block, complete: Secondary | ICD-10-CM | POA: Diagnosis not present

## 2021-09-03 LAB — BASIC METABOLIC PANEL
Anion gap: 7 (ref 5–15)
BUN: 22 mg/dL (ref 8–23)
CO2: 25 mmol/L (ref 22–32)
Calcium: 10.5 mg/dL — ABNORMAL HIGH (ref 8.9–10.3)
Chloride: 105 mmol/L (ref 98–111)
Creatinine, Ser: 0.92 mg/dL (ref 0.61–1.24)
GFR, Estimated: >60 mL/min (ref >60)
Glucose, Bld: 112 mg/dL — ABNORMAL HIGH (ref 70–99)
Potassium: 3.9 mmol/L (ref 3.5–5.1)
Sodium: 137 mmol/L (ref 135–145)

## 2021-09-03 LAB — PROTIME-INR
INR: 1.5 — ABNORMAL HIGH (ref 0.8–1.2)
Prothrombin Time: 18.1 seconds — ABNORMAL HIGH (ref 11.4–15.2)

## 2021-09-03 LAB — MAGNESIUM: Magnesium: 1.6 mg/dL — ABNORMAL LOW (ref 1.7–2.4)

## 2021-09-03 MED ORDER — MAGNESIUM SULFATE 2 GM/50ML IV SOLN: 2.0000 g | Freq: Once | INTRAVENOUS | Status: DC

## 2021-09-03 MED ORDER — MAGNESIUM SULFATE 2 GM/50ML IV SOLN
2.0000 g | Freq: Once | INTRAVENOUS | Status: AC
  Administered 2021-09-03: 2 g via INTRAVENOUS
  Filled 2021-09-03: qty 50

## 2021-09-03 MED ORDER — MAGNESIUM OXIDE (ANTACID) 400 MG PO TABS: 400.0000 mg | ORAL_TABLET | Freq: Every day | ORAL | 5 refills | Status: DC

## 2021-09-03 MED ORDER — MAGNESIUM OXIDE -MG SUPPLEMENT 400 (240 MG) MG PO TABS
400.0000 mg | ORAL_TABLET | Freq: Once | ORAL | Status: AC
  Administered 2021-09-03: 400 mg via ORAL
  Filled 2021-09-03: qty 1

## 2021-09-03 NOTE — Plan of Care (Signed)
  Problem: Education: Goal: Knowledge of General Education information will improve Description: Including pain rating scale, medication(s)/side effects and non-pharmacologic comfort measures Outcome: Progressing   Problem: Health Behavior/Discharge Planning: Goal: Ability to manage health-related needs will improve Outcome: Progressing   Problem: Clinical Measurements: Goal: Cardiovascular complication will be avoided Outcome: Progressing   Problem: Clinical Measurements: Goal: Respiratory complications will improve Outcome: Progressing   Problem: Pain Managment: Goal: General experience of comfort will improve Outcome: Progressing   Problem: Safety: Goal: Ability to remain free from injury will improve Outcome: Progressing   Problem: Skin Integrity: Goal: Risk for impaired skin integrity will decrease Outcome: Progressing

## 2021-09-03 NOTE — Discharge Summary (Signed)
DISCHARGE SUMMARY    Patient ID: Michael Colon,  MRN: [id number], DOB/AGE: 05-01-1930 88 y.o.  Admit date: 09/01/2021 Discharge date: 09/03/2021  Primary Care Physician: Venia Carbon, MD  Primary Cardiologist: Dr. Rockey Situ Electrophysiologist: new, Dr. Quentin Ore  Primary Discharge Diagnosis:  CHB  Secondary Discharge Diagnosis:  DM Paroxysmal Afib CHA2DS2Vasc is  TIA HTN  Allergies  Allergen Reactions   Lovenox [Enoxaparin] Hives   Proscar [Finasteride] Other (See Comments)    Upper body and arm weakness   Latex Rash    Has trouble with BANDAIDS that have been left on for more than 24 hours. Prefers paper tape.   Nickel Rash    Localized rash   Percocet [Oxycodone-Acetaminophen] Rash   Tape Rash and Other (See Comments)    Sensitivity      Procedures This Admission:  1.  Implantation of a BSci dual chamber PPM on 09/02/21 by Dr Quentin Ore.   There were no immediate post procedure complications.    Brief HPI: Michael Colon is a 88 y.o. male was referred to the ER from the office with EKG showing CHB, and recent fall concerning for possible symptomatic bradycardia. Labs were unremarkable except for low mag, no reversible causes were found and he was admitted for [facility] implantation  Hospital Course:  The patient was admitted his magnesium supplemented.  WIth long history of conduction system disease now w/CHB he underwent implantation of a PPM with details as outlined in his procedure report.  He was monitored on telemetry through his stay with variable heart block and > AV paced rhythm.  Left chest was without hematoma or ecchymosis.  The device was interrogated and found to be functioning normally.  CXR was obtained and demonstrated no pneumothorax status post device implantation.  Wound care, arm mobility, and restrictions were reviewed with the patient.  The patient feels well, denies any CP/SOB, with minimal site discomfort.  He was examined by Dr. Quentin Ore and  considered stable for discharge to home.   No warfarin 3 days, then resume usual schedule He sees CC 09/08/21  Add Mag ox '400mg'$  daily, mag level scheduled to be done at his wound check visit   Physical Exam: Vitals:   09/02/21 1839 09/02/21 2024 09/03/21 0054 09/03/21 0511  BP: (!) 151/95 (!) 147/76 132/70 (!) 151/85  Pulse: 60 (!) 58 60 60  Resp: '18 17 18 19  '$ Temp:  97.9 F (36.6 C) 98.1 F (36.7 C) 97.8 F (36.6 C)  TempSrc:  Oral Axillary Oral  SpO2: 98% 99% 94% 100%  Weight:      Height:        GEN- The patient is well appearing, alert and oriented x 3 today.   HEENT: normocephalic, atraumatic; sclera clear, conjunctiva pink; hearing intact; oropharynx clear; neck supple, no JVP Lungs- CTA b/l, normal work of breathing.  No wheezes, rales, rhonchi Heart- RRR, no murmurs, rubs or gallops, PMI not laterally displaced GI- soft, non-tender, non-distended Extremities- no clubbing, cyanosis, or edema MS- no significant deformity or atrophy Skin- warm and dry, no rash or lesion, left chest without hematoma/ecchymosis Psych- euthymic mood, full affect Neuro- no gross deficits   Labs:   Lab Results  Component Value Date   WBC 7.4 09/01/2021   HGB 14.0 09/01/2021   HCT 41.7 09/01/2021   MCV 94.3 09/01/2021   PLT 134 (L) 09/01/2021    Recent Labs  Lab 09/03/21 0457  NA 137  K 3.9  CL 105  CO2 25  BUN 22  CREATININE 0.92  CALCIUM 10.5*  GLUCOSE 112*    Discharge Medications:  Allergies as of 09/03/2021       Reactions   Lovenox [enoxaparin] Hives   Proscar [finasteride] Other (See Comments)   Upper body and arm weakness   Latex Rash   Has trouble with BANDAIDS that have been left on for more than 24 hours. Prefers paper tape.   Nickel Rash   Localized rash   Percocet [oxycodone-acetaminophen] Rash   Tape Rash, Other (See Comments)   Sensitivity         Medication List     TAKE these medications    APPLE CIDER VINEGAR PO Take 1 capsule by mouth at  bedtime.   atorvastatin 80 MG tablet Commonly known as: LIPITOR Take 1 tablet (80 mg total) by mouth daily.   B-12 PO Place 1 tablet under the tongue daily.   cetirizine 10 MG tablet Commonly known as: ZYRTEC Take 10 mg by mouth daily.   docusate sodium 100 MG capsule Commonly known as: COLACE Take 100 mg by mouth 2 (two) times daily.   finasteride 5 MG tablet Commonly known as: PROSCAR TAKE 1 TABLET BY MOUTH ONCE DAILY   furosemide 20 MG tablet Commonly known as: LASIX Take 1 tablet (20 mg total) by mouth daily as needed (Extra pill as needed for weight 215 or higher). Once daily and extra pill as needed for weight 215 or higher.   ibuprofen 200 MG tablet Commonly known as: ADVIL Take 200 mg by mouth 2 (two) times daily.   loratadine 10 MG tablet Commonly known as: CLARITIN Take 10 mg by mouth daily.   Magnesium Oxide (Antacid) 400 MG Tabs Take 400 mg by mouth daily.   metFORMIN 500 MG tablet Commonly known as: GLUCOPHAGE TAKE 1 TABLET BY MOUTH  TWICE DAILY WITH MEALS What changed: when to take this   SYSTANE ULTRA OP Place 1 drop into both eyes daily as needed (dry eyes).   tamsulosin 0.4 MG Caps capsule Commonly known as: FLOMAX TAKE 1 CAPSULE BY MOUTH  DAILY What changed: when to take this   triamterene-hydrochlorothiazide 37.5-25 MG capsule Commonly known as: DYAZIDE TAKE 1 CAPSULE BY MOUTH IN  THE MORNING   warfarin 5 MG tablet Commonly known as: COUMADIN Take as directed. If you are unsure how to take this medication, talk to your nurse or doctor. Original instructions: TAKE 1 TO 1 AND 1/2 TABLETS BY MOUTH DAILY AS DIRECTED  BY COUMADIN CLINIC What changed: See the new instructions. Notes to patient: Resume your usual dosing schedule on 09/06/21               Discharge Care Instructions  (From admission, onward)           Start     Ordered   09/03/21 0000  Discharge wound care:       Comments: As per AVS   09/03/21 0838             Disposition: Home Discharge Instructions     Diet - low sodium heart healthy   Complete by: As directed    Discharge wound care:   Complete by: As directed    As per AVS   Increase activity slowly   Complete by: As directed         Duration of Discharge Encounter: Greater than 30 minutes including physician time.  Venetia Night, PA-C 09/03/2021 8:39 AM

## 2021-09-03 NOTE — TOC Transition Note (Signed)
Transition of Care [facility]) - CM/SW Discharge Note   Patient Details  Name: Michael Colon MRN: [id number] Date of Birth: 21-Feb-1930  Transition [facility]) CM/SW Contact:  Tom-Johnson, Renea Ee, RN Phone Number: 09/03/2021, 9:59 AM   Clinical Narrative:     Patient is scheduled for discharge today. To resume scheduled Warfarin in 3 days per MD, Instructions on AVS. F/U appointments also on AVS. Son to transport at discharge. No further TOC needs noted.    Final next level of care: Home/Self Care Barriers to Discharge: Barriers Resolved   Patient Goals and CMS Choice Patient states their goals for this hospitalization and ongoing recovery are:: To retirn home CMS Medicare.gov Compare Post Acute Care list provided to:: Patient Choice offered to / list presented to : NA  Discharge Placement                Patient to be transferred to facility by: Son      Discharge Plan and Services                DME Arranged: N/A DME Agency: NA       HH Arranged: NA HH Agency: NA        Social Determinants of Health (SDOH) Interventions     Readmission Risk Interventions     No data to display

## 2021-09-03 NOTE — Discharge Instructions (Signed)
     Supplemental Discharge Instructions for  Pacemaker/Defibrillator Patients    Activity No heavy lifting or vigorous activity with your left/right arm for 6 to 8 weeks.  Do not raise your left/right arm above your head for one week.  Gradually raise your affected arm as drawn below.              09/07/21                      09/08/21                    09/09/21                    09/10/21 __  NO DRIVING for  1 week   ; you may begin driving on  03/08/12   .  WOUND CARE Keep the wound area clean and dry.  Do not get this area wet , no showers for one week; you may shower on  09/10/21   . The tape/steri-strips on your wound will fall off; do not pull them off.  No bandage is needed on the site.  DO  NOT apply any creams, oils, or ointments to the wound area. If you notice any drainage or discharge from the wound, any swelling or bruising at the site, or you develop a fever > 101? F after you are discharged home, call the office at once.  Special Instructions You are still able to use cellular telephones; use the ear opposite the side where you have your pacemaker/defibrillator.  Avoid carrying your cellular phone near your device. When traveling through airports, show security personnel your identification card to avoid being screened in the metal detectors.  Ask the security personnel to use the hand wand. Avoid arc welding equipment, MRI testing (magnetic resonance imaging), TENS units (transcutaneous nerve stimulators).  Call the office for questions about other devices. Avoid electrical appliances that are in poor condition or are not properly grounded. Microwave ovens are safe to be near or to operate.

## 2021-09-06 MED FILL — Heparin Sod (Porcine)-NaCl IV Soln 1000 Unit/500ML-0.9%: INTRAVENOUS | Qty: 500 | Status: AC

## 2021-09-07 ENCOUNTER — Telehealth: Payer: Self-pay | Admitting: *Deleted

## 2021-09-07 NOTE — Patient Outreach (Signed)
  Care Coordination [facility] Note Transition Care Management Follow-up Telephone Call Date of discharge and from where: [facility] [phone] How have you been since you were released from the hospital? Feeling better since I had the implant Any questions or concerns? No  Items Reviewed: Did the pt receive and understand the discharge instructions provided? Yes  Medications obtained and verified? Yes  Other? No  Any new allergies since your discharge? No  Dietary orders reviewed? Yes Do you have support at home? Yes   Home Care and Equipment/Supplies: Were home health services ordered? not applicable If so, what is the name of the agency? N   Has the agency set up a time to come to the patient's home? not applicable Were any new equipment or medical supplies ordered?  No What is the name of the medical supply agency? N  Were you able to get the supplies/equipment? not applicable Do you have any questions related to the use of the equipment or supplies? No  Functional Questionnaire: (I = Independent and D = Dependent) ADLs: I  Bathing/Dressing- I  Meal Prep- I  Eating- I  Maintaining continence- I  Transferring/Ambulation- I  Managing Meds- I  Follow up appointments reviewed:  PCP Hospital f/u appt confirmed? [facility] f/u appt confirmed? Yes  Scheduled to see Coumadin clinic [id number] 9:45 and Coumadin lab [id number] 9:45 and  Wound check [id number] 10:00  Are transportation arrangements needed? No  If their condition worsens, is the pt aware to call PCP or go to the Emergency Dept.? Yes Was the patient provided with contact information for the PCP's office or ED? Yes Was to pt encouraged to call back with questions or concerns? Yes  SDOH assessments and interventions completed:   Yes  Care Coordination Interventions Activated:  Yes   Care Coordination Interventions:   N     Encounter Outcome:  Pt. Visit Completed   Branch  Management [phone]

## 2021-09-08 ENCOUNTER — Ambulatory Visit: Payer: Medicare Other | Attending: Internal Medicine

## 2021-09-08 DIAGNOSIS — I4892 Unspecified atrial flutter: Secondary | ICD-10-CM

## 2021-09-08 DIAGNOSIS — Z5181 Encounter for therapeutic drug level monitoring: Secondary | ICD-10-CM

## 2021-09-08 LAB — POCT INR: INR: 1 — AB (ref 2.0–3.0)

## 2021-09-08 NOTE — Patient Instructions (Signed)
TAKE 2 TABLETS TODAY ONLY and then continue warfarin dosage of 1 tablet daily except 1.5 tablet on SUNDAYS, TUESDAYS & THURSDAYS.   - Recheck in 2 weeks.   Please be consistent with your green intake - please pick a day or days each week to have your greens and have them every week on that day (your pills are on a schedule, so your greens need to be on one)

## 2021-09-14 ENCOUNTER — Encounter: Payer: Self-pay | Admitting: Internal Medicine

## 2021-09-16 ENCOUNTER — Ambulatory Visit: Payer: Medicare Other

## 2021-09-16 ENCOUNTER — Ambulatory Visit: Payer: Medicare Other | Attending: Internal Medicine

## 2021-09-16 DIAGNOSIS — I442 Atrioventricular block, complete: Secondary | ICD-10-CM | POA: Diagnosis not present

## 2021-09-16 LAB — CUP PACEART INCLINIC DEVICE CHECK
Brady Statistic RA Percent Paced: 50 %
Brady Statistic RV Percent Paced: 96 %
Date Time Interrogation Session: 20230914143837
Implantable Lead Implant Date: 20230831
Implantable Lead Implant Date: 20230831
Implantable Lead Location: 753859
Implantable Lead Location: 753860
Implantable Lead Model: 7841
Implantable Lead Model: 7842
Implantable Lead Serial Number: 1220926
Implantable Lead Serial Number: 1322603
Implantable Pulse Generator Implant Date: 20230831
Lead Channel Impedance Value: 540 Ohm
Lead Channel Impedance Value: 571 Ohm
Lead Channel Pacing Threshold Amplitude: 0.8 V
Lead Channel Pacing Threshold Amplitude: 0.9 V
Lead Channel Pacing Threshold Pulse Width: 0.4 ms
Lead Channel Pacing Threshold Pulse Width: 0.4 ms
Lead Channel Sensing Intrinsic Amplitude: 2.4 mV
Lead Channel Sensing Intrinsic Amplitude: 20.7 mV
Lead Channel Setting Pacing Amplitude: 3.5 V
Lead Channel Setting Pacing Amplitude: 3.5 V
Lead Channel Setting Pacing Pulse Width: 0.4 ms
Lead Channel Setting Sensing Sensitivity: 3.5 mV
Pulse Gen Serial Number: 613820

## 2021-09-16 LAB — MAGNESIUM: Magnesium: 1.6 mg/dL (ref 1.6–2.3)

## 2021-09-16 NOTE — Patient Instructions (Signed)
   After Your Pacemaker   Monitor your pacemaker site for redness, swelling, and drainage. Call the device clinic at [phone] if you experience these symptoms or fever/chills.  Your incision was closed with Steri-strips or staples:  You may shower 7 days after your procedure and wash your incision with soap and water. Avoid lotions, ointments, or perfumes over your incision until it is well-healed.  You may use a hot tub or a pool after your wound check appointment if the incision is completely closed.  Do not lift, push or pull greater than 10 pounds with the affected arm until 6 weeks after your procedure. There are no other restrictions in arm movement after your wound check appointment.  UNTIL AFTER OCTOBER 12TH.  You may drive, unless driving has been restricted by your healthcare providers.  Remote monitoring is used to monitor your pacemaker from home. This monitoring is scheduled every 91 days by our office. It allows [place] to keep an eye on the functioning of your device to ensure it is working properly. You will routinely see your Electrophysiologist annually (more often if necessary).

## 2021-09-22 ENCOUNTER — Ambulatory Visit: Payer: Medicare Other | Attending: Cardiology

## 2021-09-22 DIAGNOSIS — Z5181 Encounter for therapeutic drug level monitoring: Secondary | ICD-10-CM | POA: Diagnosis not present

## 2021-09-22 DIAGNOSIS — I4892 Unspecified atrial flutter: Secondary | ICD-10-CM | POA: Diagnosis not present

## 2021-09-22 LAB — POCT INR: INR: 3.1 — AB (ref 2.0–3.0)

## 2021-09-22 NOTE — Patient Instructions (Signed)
continue warfarin dosage of 1 tablet daily except 1.5 tablet on SUNDAYS, TUESDAYS & THURSDAYS.   - Recheck in 4 weeks.   Please be consistent with your green intake - please pick a day or days each week to have your greens and have them every week on that day (your pills are on a schedule, so your greens need to be on one)

## 2021-10-20 ENCOUNTER — Telehealth: Payer: Self-pay | Admitting: Physician Assistant

## 2021-10-20 ENCOUNTER — Ambulatory Visit: Payer: Medicare Other | Attending: Cardiovascular Disease

## 2021-10-20 DIAGNOSIS — Z5181 Encounter for therapeutic drug level monitoring: Secondary | ICD-10-CM

## 2021-10-20 DIAGNOSIS — I4892 Unspecified atrial flutter: Secondary | ICD-10-CM | POA: Diagnosis not present

## 2021-10-20 LAB — POCT INR: INR: 2.9 (ref 2.0–3.0)

## 2021-10-20 MED ORDER — MAGNESIUM OXIDE 400 MG PO CAPS: 400.0000 mg | ORAL_CAPSULE | Freq: Every day | ORAL | 3 refills | Status: AC

## 2021-10-20 NOTE — Telephone Encounter (Signed)
Pt c/o medication issue:  1. Name of Medication: Magnesium Oxide, Antacid, 400 MG TABS  2. How are you currently taking this medication (dosage and times per day)? 1 tablet daily  3. Are you having a reaction (difficulty breathing--STAT)? no  4. What is your medication issue? [facility] states [facility] only [facility] the capsule and it will be cheaper or [facility] at [facility]. He would like the prescription changed to capsules and [facility] to [facility]. He would like a call back to confirm when it's done.

## 2021-10-20 NOTE — Telephone Encounter (Signed)
Spoke with patient and advised him that I have sent in a prescription for magnesium to [facility] per request. Patient verbalized understanding.

## 2021-10-20 NOTE — Patient Instructions (Signed)
continue warfarin dosage of 1 tablet daily except 1.5 tablet on SUNDAYS, TUESDAYS & THURSDAYS.   - Recheck in 7 weeks.   Please be consistent with your green intake - please pick a day or days each week to have your greens and have them every week on that day (your pills are on a schedule, so your greens need to be on one)

## 2021-11-01 ENCOUNTER — Encounter (INDEPENDENT_AMBULATORY_CARE_PROVIDER_SITE_OTHER): Payer: Self-pay

## 2021-11-08 ENCOUNTER — Other Ambulatory Visit: Payer: Self-pay | Admitting: Internal Medicine

## 2021-11-08 ENCOUNTER — Ambulatory Visit (INDEPENDENT_AMBULATORY_CARE_PROVIDER_SITE_OTHER): Payer: Medicare Other | Admitting: Internal Medicine

## 2021-11-08 ENCOUNTER — Encounter: Payer: Self-pay | Admitting: Internal Medicine

## 2021-11-08 VITALS — BP 122/80 | HR 68 | Temp 97.9°F | Ht 70.0 in | Wt 194.0 lb

## 2021-11-08 DIAGNOSIS — L97909 Non-pressure chronic ulcer of unspecified part of unspecified lower leg with unspecified severity: Secondary | ICD-10-CM | POA: Insufficient documentation

## 2021-11-08 DIAGNOSIS — B029 Zoster without complications: Secondary | ICD-10-CM | POA: Diagnosis not present

## 2021-11-08 DIAGNOSIS — L97921 Non-pressure chronic ulcer of unspecified part of left lower leg limited to breakdown of skin: Secondary | ICD-10-CM

## 2021-11-08 MED ORDER — SILVER SULFADIAZINE 1 % EX CREA: 1.0000 | TOPICAL_CREAM | Freq: Every day | CUTANEOUS | 0 refills | Status: DC

## 2021-11-08 NOTE — Assessment & Plan Note (Signed)
Apparently improved No infection and superficial  Redressed with silvadene, telfa/guaze and coban Discussed home care

## 2021-11-08 NOTE — Assessment & Plan Note (Signed)
Seems to have had recurrence but only a few lesions left and crusted---so will hold off on any Rx

## 2021-11-08 NOTE — Progress Notes (Signed)
Subjective:    Patient ID: Michael Colon, male    DOB: [birth year], 88 y.o.   MRN: [id number]  HPI Here due to concern about cellulitis on leg---and rash on head  Has been having Advertising account planner at [facility] wrap his left calf Has skin breakdown--and he started dressing it Nurse did it several times last week--and had concerns about infection Not particularly painful now--had been tender a week ago  Swelling is better  Also has abnormal area with scabs on left occiput Wonders about shingles--since he had it there in the past Started about 3 weeks ago Does seem improved--not as itchy and burning as at first  Current Outpatient Medications on File Prior to Visit  Medication Sig Dispense Refill   APPLE CIDER VINEGAR PO Take 1 capsule by mouth at bedtime.     atorvastatin (LIPITOR) 80 MG tablet Take 1 tablet (80 mg total) by mouth daily. 90 tablet 3   cetirizine (ZYRTEC) 10 MG tablet Take 10 mg by mouth daily.     Cyanocobalamin (B-12 PO) Place 1 tablet under the tongue daily.     docusate sodium (COLACE) 100 MG capsule Take 100 mg by mouth 2 (two) times daily.     finasteride (PROSCAR) 5 MG tablet TAKE 1 TABLET BY MOUTH ONCE DAILY 90 tablet 3   ibuprofen (ADVIL,MOTRIN) 200 MG tablet Take 200 mg by mouth 2 (two) times daily.     loratadine (CLARITIN) 10 MG tablet Take 10 mg by mouth daily.     Magnesium Oxide 400 MG CAPS Take 1 capsule (400 mg total) by mouth daily. 90 capsule 3   metFORMIN (GLUCOPHAGE) 500 MG tablet TAKE 1 TABLET BY MOUTH  TWICE DAILY WITH MEALS (Patient taking differently: Take 500 mg by mouth 2 (two) times daily.) 180 tablet 3   Polyethyl Glycol-Propyl Glycol (SYSTANE ULTRA OP) Place 1 drop into both eyes daily as needed (dry eyes).     triamterene-hydrochlorothiazide (DYAZIDE) 37.5-25 MG capsule TAKE 1 CAPSULE BY MOUTH IN  THE MORNING 100 capsule 3   warfarin (COUMADIN) 5 MG tablet TAKE 1 TO 1 AND 1/2 TABLETS BY MOUTH DAILY AS DIRECTED  BY COUMADIN CLINIC (Patient  taking differently: Take 5-7.5 mg by mouth See admin instructions. 7.5 mg at bedtime on Sunday, Tuesday, Thursday 5 mg at bedtime on Monday, Wednesday, Friday, Saturday) 135 tablet 3   furosemide (LASIX) 20 MG tablet Take 1 tablet (20 mg total) by mouth daily as needed (Extra pill as needed for weight 215 or higher). Once daily and extra pill as needed for weight 215 or higher. (Patient not taking: Reported on 09/01/2021) 180 tablet 0   No current facility-administered medications on file prior to visit.    Allergies  Allergen Reactions   Lovenox [Enoxaparin] Hives   Proscar [Finasteride] Other (See Comments)    Upper body and arm weakness   Latex Rash    Has trouble with BANDAIDS that have been left on for more than 24 hours. Prefers paper tape.   Nickel Rash    Localized rash   Percocet [Oxycodone-Acetaminophen] Rash   Tape Rash and Other (See Comments)    Sensitivity     Past Medical History:  Diagnosis Date   Asthma    Atrial flutter ([place]) 2007   Band keratopathy    Benign prostatic hypertrophy    Chronic ear infection    Chronic kidney disease    Chronic prostatitis    Dental bridge present    permanent -  upper   Diabetes mellitus    Elbow stiffness, left    s/p fracture many yrs ago.  arm does not straighten   GERD (gastroesophageal reflux disease)    laryngeal involvement   Hyperlipidemia    Hypertension    Obstructive sleep apnea    CPAP-9   Osteoarthrosis, localized, primary, knee    post-traumatic   Prostatitis    Stroke ([place])    TIA   Tachy-brady syndrome ([place])    UTI (urinary tract infection)     Past Surgical History:  Procedure Laterality Date   APPENDECTOMY     BELPHAROPTOSIS REPAIR     Dr [name]---didn't resolve weepy eye and eyelid drooping   CARDIOVERSION  04/13/2010   CATARACT EXTRACTION, BILATERAL  2009   Chest pain  8/12   Stress test benign   ESOPHAGEAL DILATION  05/26/2016   Procedure: ESOPHAGEAL DILATION;  Surgeon: Lucilla Lame, MD;   Location: [place];  Service: Endoscopy;;   ESOPHAGOGASTRODUODENOSCOPY (EGD) WITH PROPOFOL N/A 05/26/2016   Procedure: ESOPHAGOGASTRODUODENOSCOPY (EGD) WITH PROPOFOL;  Surgeon: Lucilla Lame, MD;  Location: [place];  Service: Endoscopy;  Laterality: N/A;  Diabetic - oral meds sleep apnea   EYE SURGERY     FRACTURE SURGERY Left    elbow   KNEE SURGERY  1998   plate after fracture, then removed for infection   left elbow surgery     MASTOIDECTOMY  8/08   Dr Idelle Crouch   PACEMAKER IMPLANT N/A 09/02/2021   Procedure: PACEMAKER IMPLANT;  Surgeon: Vickie Epley, MD;  Location: [place] [facility];  Service: Cardiovascular;  Laterality: N/A;   RHINOPLASTY  5/10   and septoplasty   SUBACROMIAL DECOMPRESSION Right 2005   Arthroscopic (for rotator cuff and biceps tendon ruptures)   TEAR DUCT PROBING  11/13   Dr Vickki Muff   TOTAL KNEE ARTHROPLASTY Left 09/01/2014   Procedure: TOTAL KNEE ARTHROPLASTY;  Surgeon: Dereck Leep, MD;  Location: [facility];  Service: Orthopedics;  Laterality: Left;   TRIGGER FINGER RELEASE Left 02/25/2015   Procedure: LEFT LONG TRIGGER RELEASE;  Surgeon: Dereck Leep, MD;  Location: [facility];  Service: Orthopedics;  Laterality: Left;   VENOUS ABLATION      Family History  Problem Relation Age of Onset   Colon cancer Father    Diabetes Father    Hypertension Father    Other Mother        natural causes   Heart attack Neg Hx    Stroke Neg Hx     Social History   Socioeconomic History   Marital status: Widowed    Spouse name: Not on file   Number of children: 2   Years of education: Not on file   Highest education level: Not on file  Occupational History   Occupation: Pastor    Comment: Retired--[employer]  Tobacco Use   Smoking status: Former    Packs/day: 1.00    Years: 0.00    Total pack years: 0.00    Types: Cigarettes    Quit date: 01/03/1969    Years since quitting: 52.8    Passive exposure: Never   Smokeless tobacco: Never   Vaping Use   Vaping Use: Never used  Substance and Sexual Activity   Alcohol use: No    Alcohol/week: 1.0 standard drink of alcohol    Types: 1 Glasses of wine per week    Comment: rare wine. 1-2x/yr.   Drug use: No   Sexual activity: Not on  file  Other Topics Concern   Not on file  Social History Narrative   Has living will   Health care POA-- son Shanon Brow   Has DNR order from the past---form redone 06/25/10   Probably no feeding tube if cognitively unaware   Social Determinants of Health   Financial Resource Strain: Not on file  Food Insecurity: Not on file  Transportation Needs: Not on file  Physical Activity: Not on file  Stress: Not on file  Social Connections: Not on file  Intimate Partner Violence: Not on file   Review of Systems No fever No N/V     Objective:   Physical Exam HENT:     Head:     Comments: 3 crusted lesions on vertex just left of midline 3 other lesions in line just below occiput No inflammation Skin:    Comments: Large wrap on left calf undone ~4cm superficial ulcer on posterior lower left calf No inflammation or redness Not granulated as yet            Assessment & Plan:

## 2021-11-09 NOTE — Progress Notes (Unsigned)
Date:  11/10/2021   ID:  [name], DOB 1930/11/09, MRN [id number]  Patient Location:  [address] [place] [address]   Provider location:   [facility], [place] office  PCP:  Venia Carbon, MD  Cardiologist:  [facility]  Cc: Fall on way to office, hurt head, left elbow   History of Present Illness:    Michael Colon is a 88 y.o. male  past medical history of CT calcium score 1300,  atrial flutter (3/14), previous ablation , on warfarin,  hypertension,  borderline diabetes,  Obstructive sleep apnea  [facility] on August 04, 2010 with chest pain.  Symptoms started after eating and it was felt he had GI spasm or hiatal hernia. Mild improvement in symptoms with nitroglycerin and GI cocktail in the emergency room.  reports having a TIA some time in 2014, workup negative at [facility]  Previous vein ablation surgery at [facility] diabetes type 2 S/p  right great saphenous vein and small saphenous vein laser ablation.  He presents today for follow-up of his atrial flutter, hyperlipidemia  Last seen by myself in clinic September 2022  Seen by one of our providers August 2023   seen in the ED on 08/15/2021 after sustaining a mechanical fall, losing his balance while going down the stairs and hitting his head on a piece of furniture with a sharp corner.  He had a laceration to the head.  His laceration was repaired with staples.   CT head showed no acute intracranial findings.   CT cervical spine showed no recent fracture.    EKG concerning for AV conduction delay  Seen in the office September 01, 2021, complete heart block noted Was transferred to [facility], pacemaker placed September 02, 2021  On discussion today, he had a fall coming into the office today He had parked his car, was walking up to the building , mechanical fall,   was helped up by Nature conservation officer at the scene,  Evaluated by CMA from urology, rapid response called but he declined evaluation  in the emergency room, determined to make his appointment today  In the office, sitting in a wheelchair, ecchymotic bruising to face, with pain in left elbow, Swelling of left forehead,  Pain in left elbow, "8/10 on extension of his left arm" " I busted it".  Reports full weight of his body landed on left elbow Lives alone, no immediate family in the area Has a friend he can call for assistance Drove into the office today  Denies any near-syncope or syncope episodes  Reports his weight is stable Has lost weight over the past 2 years with dietary changes  Mild leg swelling, wears compression hose on a regular basis  EKG personally reviewed by myself on todays visit Paced rhythm rate 67 bpm PVCs  Labs A1c 7.6 Total chol 110, LDL 36  Other past medical history reviewed echocardiogram October 2021 Normal ejection fraction asymmetric left ventricular  hypertrophy of the basal-septal segment Aorta 38 mm Mildly elevated right heart pressures  Lab work reviewed most recent creatinine 0.94 BUN 31, improvement from September BUN 42 creatinine 1.01  Chronic leg swelling worse on the left leg than the right secondary to previous knee surgeries  Previously evaluated for bradycardia. Pacemaker was not recommended at that time. Previously seen by Dr. Caryl Comes, EP  Previous Holter monitor showed normal sinus rhythm with pauses up to 2.86 seconds, bradycardia with heart rates in the 20s to 30s at  nighttime, 30s to 50 during the daytime.  Hemoglobin A1c in December 2014 7.5, total cholesterol up from 132 now 200  CT calcium score 1300,  stress test 04/2015 Following the CT scan, no ischemia    Past Medical History:  Diagnosis Date   Asthma    Atrial flutter ([place]) 2007   Band keratopathy    Benign prostatic hypertrophy    Chronic ear infection    Chronic kidney disease    Chronic prostatitis    Dental bridge present    permanent - upper   Diabetes mellitus    Elbow stiffness, left     s/p fracture many yrs ago.  arm does not straighten   GERD (gastroesophageal reflux disease)    laryngeal involvement   Hyperlipidemia    Hypertension    Obstructive sleep apnea    CPAP-9   Osteoarthrosis, localized, primary, knee    post-traumatic   Prostatitis    Stroke ([place])    TIA   Tachy-brady syndrome ([place])    UTI (urinary tract infection)    Past Surgical History:  Procedure Laterality Date   APPENDECTOMY     BELPHAROPTOSIS REPAIR     Dr [name]---didn't resolve weepy eye and eyelid drooping   CARDIOVERSION  04/13/2010   CATARACT EXTRACTION, BILATERAL  2009   Chest pain  8/12   Stress test benign   ESOPHAGEAL DILATION  05/26/2016   Procedure: ESOPHAGEAL DILATION;  Surgeon: Lucilla Lame, MD;  Location: [place];  Service: Endoscopy;;   ESOPHAGOGASTRODUODENOSCOPY (EGD) WITH PROPOFOL N/A 05/26/2016   Procedure: ESOPHAGOGASTRODUODENOSCOPY (EGD) WITH PROPOFOL;  Surgeon: Lucilla Lame, MD;  Location: [place];  Service: Endoscopy;  Laterality: N/A;  Diabetic - oral meds sleep apnea   EYE SURGERY     FRACTURE SURGERY Left    elbow   KNEE SURGERY  1998   plate after fracture, then removed for infection   left elbow surgery     MASTOIDECTOMY  8/08   Dr Idelle Crouch   PACEMAKER IMPLANT N/A 09/02/2021   Procedure: PACEMAKER IMPLANT;  Surgeon: Vickie Epley, MD;  Location: [facility];  Service: Cardiovascular;  Laterality: N/A;   RHINOPLASTY  5/10   and septoplasty   SUBACROMIAL DECOMPRESSION Right 2005   Arthroscopic (for rotator cuff and biceps tendon ruptures)   TEAR DUCT PROBING  11/13   Dr Vickki Muff   TOTAL KNEE ARTHROPLASTY Left 09/01/2014   Procedure: TOTAL KNEE ARTHROPLASTY;  Surgeon: Dereck Leep, MD;  Location: [facility];  Service: Orthopedics;  Laterality: Left;   TRIGGER FINGER RELEASE Left 02/25/2015   Procedure: LEFT LONG TRIGGER RELEASE;  Surgeon: Dereck Leep, MD;  Location: [facility];  Service: Orthopedics;  Laterality: Left;    VENOUS ABLATION       Current Outpatient Medications on File Prior to Visit  Medication Sig Dispense Refill   APPLE CIDER VINEGAR PO Take 1 capsule by mouth at bedtime.     atorvastatin (LIPITOR) 80 MG tablet Take 1 tablet (80 mg total) by mouth daily. 90 tablet 3   cetirizine (ZYRTEC) 10 MG tablet Take 10 mg by mouth daily.     Cyanocobalamin (B-12 PO) Place 1 tablet under the tongue daily.     docusate sodium (COLACE) 100 MG capsule Take 100 mg by mouth 2 (two) times daily.     finasteride (PROSCAR) 5 MG tablet TAKE 1 TABLET BY MOUTH ONCE DAILY 90 tablet 3   furosemide (LASIX) 20 MG tablet Take 1 tablet (20  mg total) by mouth daily as needed (Extra pill as needed for weight 215 or higher). Once daily and extra pill as needed for weight 215 or higher. (Patient not taking: Reported on 09/01/2021) 180 tablet 0   ibuprofen (ADVIL,MOTRIN) 200 MG tablet Take 200 mg by mouth 2 (two) times daily.     loratadine (CLARITIN) 10 MG tablet Take 10 mg by mouth daily.     Magnesium Oxide 400 MG CAPS Take 1 capsule (400 mg total) by mouth daily. 90 capsule 3   metFORMIN (GLUCOPHAGE) 500 MG tablet TAKE 1 TABLET BY MOUTH  TWICE DAILY WITH MEALS (Patient taking differently: Take 500 mg by mouth 2 (two) times daily.) 180 tablet 3   Polyethyl Glycol-Propyl Glycol (SYSTANE ULTRA OP) Place 1 drop into both eyes daily as needed (dry eyes).     silver sulfADIAZINE (SILVADENE) 1 % cream Apply 1 Application topically daily. 50 g 0   tamsulosin (FLOMAX) 0.4 MG CAPS capsule TAKE 1 CAPSULE BY MOUTH  DAILY 100 capsule 3   triamterene-hydrochlorothiazide (DYAZIDE) 37.5-25 MG capsule TAKE 1 CAPSULE BY MOUTH IN  THE MORNING 100 capsule 3   warfarin (COUMADIN) 5 MG tablet TAKE 1 TO 1 AND 1/2 TABLETS BY MOUTH DAILY AS DIRECTED  BY COUMADIN CLINIC (Patient taking differently: Take 5-7.5 mg by mouth See admin instructions. 7.5 mg at bedtime on Sunday, Tuesday, Thursday 5 mg at bedtime on Monday, Wednesday, Friday, Saturday) 135  tablet 3   No current facility-administered medications on file prior to visit.     Allergies:   Lovenox [enoxaparin], Proscar [finasteride], Latex, Nickel, Percocet [oxycodone-acetaminophen], and Tape   Social History   Tobacco Use   Smoking status: Former    Packs/day: 1.00    Years: 0.00    Total pack years: 0.00    Types: Cigarettes    Quit date: 01/03/1969    Years since quitting: 52.8    Passive exposure: Never   Smokeless tobacco: Never  Vaping Use   Vaping Use: Never used  Substance Use Topics   Alcohol use: No    Alcohol/week: 1.0 standard drink of alcohol    Types: 1 Glasses of wine per week    Comment: rare wine. 1-2x/yr.   Drug use: No     Family Hx: The patient's family history includes Colon cancer in his father; Diabetes in his father; Hypertension in his father; Other in his mother. There is no history of Heart attack or Stroke.  ROS:   Please see the history of present illness.    Review of Systems  Constitutional: Negative.   HENT: Negative.    Respiratory: Negative.    Cardiovascular: Negative.   Gastrointestinal: Negative.   Musculoskeletal:  Positive for falls and joint pain.  Neurological: Negative.   Psychiatric/Behavioral: Negative.    All other systems reviewed and are negative.    Labs/Other Tests and Data Reviewed:    Recent Labs: 02/25/2021: ALT 11 09/01/2021: Hemoglobin 14.0; Platelets 134 09/03/2021: BUN 22; Creatinine, Ser 0.92; Potassium 3.9; Sodium 137 09/16/2021: Magnesium 1.6   Recent Lipid Panel Lab Results  Component Value Date/Time   CHOL 131 02/25/2021 03:07 PM   TRIG 67.0 02/25/2021 03:07 PM   HDL 70.00 02/25/2021 03:07 PM   CHOLHDL 2 02/25/2021 03:07 PM   LDLCALC 47 02/25/2021 03:07 PM    Wt Readings from Last 3 Encounters:  11/10/21 194 lb (88 kg)  11/08/21 194 lb (88 kg)  09/01/21 198 lb 1.6 oz (89.9 kg)  Exam:    BP 120/60 (BP Location: Left Arm)   Pulse 67   Wt 194 lb (88 kg)   BMI 27.84 kg/m   Constitutional:  oriented to person, place, and time. No distress.  Ecchymosis /bleeding on face, swelling left forehead, HENT:  Head: Grossly normal Eyes:  no discharge. No scleral icterus.  Neck: No JVD, no carotid bruits  Cardiovascular: Regular rate and rhythm, no murmurs appreciated Pulmonary/Chest: Clear to auscultation bilaterally, no wheezes or rails Abdominal: Soft.  no distension.  no tenderness.  Musculoskeletal: Normal range of motion, ecchymosis knees, elbows, face Neurological:  normal muscle tone. Coordination normal. No atrophy Skin: Skin warm and dry Psychiatric: normal affect, pleasant  ASSESSMENT & PLAN:   Frequent falls Recent mechanical fall August 2023 with head laceration, Another fall today walking into the office We will defer to primary care, needs home PT, OT evaluation , risk assessment of living situation Lives alone, no family locally We recommended that he call family, make them aware of his fall  Atrial fibrillation/flutter On anticoagulation, warfarin Pacemaker now in place for complete heart block Rate controlled in the 34V  Chronic diastolic CHF Goal weight 425, typically takes extra Lasix for weight 215 Appears euvolemic, no changes to medications  Complete heart block Pacer placed by Dr. Quentin Ore  Mixed hyperlipidemia Cholesterol is at goal on the current lipid regimen. No changes to the medications were made.  Essential hypertension Blood pressure is well controlled on today's visit. No changes made to the medications.  Type 2 diabetes, controlled, with neuropathy (HCC) A1c 6 up to 7.6, high end of the range Followed by primary care  Aortic atherosclerosis (HCC) Cholesterol is at goal on the current lipid regimen. No changes to the medications were made.  Obstructive sleep apnea On cpap, weight stable Followed by pulmonary   Total encounter time more than 30 minutes  Greater than 50% was spent in counseling and coordination of  care with the patient   Signed, Ida Rogue, MD  11/10/2021 8:28 AM    [facility] [address], [place], [place] [address]

## 2021-11-10 ENCOUNTER — Encounter: Payer: Self-pay | Admitting: Cardiovascular Disease

## 2021-11-10 ENCOUNTER — Emergency Department: Payer: Medicare Other

## 2021-11-10 ENCOUNTER — Ambulatory Visit: Payer: Medicare Other | Attending: Cardiovascular Disease | Admitting: Cardiovascular Disease

## 2021-11-10 ENCOUNTER — Other Ambulatory Visit: Payer: Self-pay

## 2021-11-10 ENCOUNTER — Encounter: Payer: Self-pay | Admitting: Emergency Medicine

## 2021-11-10 ENCOUNTER — Emergency Department
Admission: EM | Admit: 2021-11-10 | Discharge: 2021-11-10 | Disposition: A | Discharge status: Home / Self Care | Payer: Medicare Other | Attending: Emergency Medicine | Admitting: Emergency Medicine

## 2021-11-10 ENCOUNTER — Ambulatory Visit: Payer: Medicare Other | Admitting: Urology

## 2021-11-10 VITALS — BP 120/60 | HR 67 | Wt 194.0 lb

## 2021-11-10 DIAGNOSIS — S60219A Contusion of unspecified wrist, initial encounter: Secondary | ICD-10-CM

## 2021-11-10 DIAGNOSIS — E782 Mixed hyperlipidemia: Secondary | ICD-10-CM | POA: Diagnosis not present

## 2021-11-10 DIAGNOSIS — S5002XA Contusion of left elbow, initial encounter: Secondary | ICD-10-CM | POA: Insufficient documentation

## 2021-11-10 DIAGNOSIS — I495 Sick sinus syndrome: Secondary | ICD-10-CM

## 2021-11-10 DIAGNOSIS — I2510 Atherosclerotic heart disease of native coronary artery without angina pectoris: Secondary | ICD-10-CM | POA: Diagnosis not present

## 2021-11-10 DIAGNOSIS — S5012XA Contusion of left forearm, initial encounter: Secondary | ICD-10-CM | POA: Insufficient documentation

## 2021-11-10 DIAGNOSIS — Y92480 Sidewalk as the place of occurrence of the external cause: Secondary | ICD-10-CM | POA: Insufficient documentation

## 2021-11-10 DIAGNOSIS — E119 Type 2 diabetes mellitus without complications: Secondary | ICD-10-CM | POA: Diagnosis not present

## 2021-11-10 DIAGNOSIS — S0081XA Abrasion of other part of head, initial encounter: Secondary | ICD-10-CM | POA: Insufficient documentation

## 2021-11-10 DIAGNOSIS — I5032 Chronic diastolic (congestive) heart failure: Secondary | ICD-10-CM | POA: Insufficient documentation

## 2021-11-10 DIAGNOSIS — W010XXA Fall on same level from slipping, tripping and stumbling without subsequent striking against object, initial encounter: Secondary | ICD-10-CM | POA: Diagnosis not present

## 2021-11-10 DIAGNOSIS — S0993XA Unspecified injury of face, initial encounter: Secondary | ICD-10-CM | POA: Diagnosis present

## 2021-11-10 DIAGNOSIS — W19XXXA Unspecified fall, initial encounter: Secondary | ICD-10-CM

## 2021-11-10 DIAGNOSIS — M7989 Other specified soft tissue disorders: Secondary | ICD-10-CM | POA: Diagnosis not present

## 2021-11-10 DIAGNOSIS — I442 Atrioventricular block, complete: Secondary | ICD-10-CM | POA: Diagnosis not present

## 2021-11-10 DIAGNOSIS — R791 Abnormal coagulation profile: Secondary | ICD-10-CM | POA: Insufficient documentation

## 2021-11-10 DIAGNOSIS — I10 Essential (primary) hypertension: Secondary | ICD-10-CM

## 2021-11-10 DIAGNOSIS — S0990XA Unspecified injury of head, initial encounter: Secondary | ICD-10-CM | POA: Diagnosis not present

## 2021-11-10 DIAGNOSIS — S199XXA Unspecified injury of neck, initial encounter: Secondary | ICD-10-CM | POA: Diagnosis not present

## 2021-11-10 DIAGNOSIS — I77810 Thoracic aortic ectasia: Secondary | ICD-10-CM

## 2021-11-10 DIAGNOSIS — I110 Hypertensive heart disease with heart failure: Secondary | ICD-10-CM | POA: Diagnosis not present

## 2021-11-10 DIAGNOSIS — I4892 Unspecified atrial flutter: Secondary | ICD-10-CM

## 2021-11-10 DIAGNOSIS — S60212A Contusion of left wrist, initial encounter: Secondary | ICD-10-CM | POA: Insufficient documentation

## 2021-11-10 DIAGNOSIS — S022XXA Fracture of nasal bones, initial encounter for closed fracture: Secondary | ICD-10-CM | POA: Diagnosis not present

## 2021-11-10 DIAGNOSIS — S60512A Abrasion of left hand, initial encounter: Secondary | ICD-10-CM | POA: Insufficient documentation

## 2021-11-10 DIAGNOSIS — M25522 Pain in left elbow: Secondary | ICD-10-CM | POA: Diagnosis not present

## 2021-11-10 LAB — PROTIME-INR
INR: 2.1 — ABNORMAL HIGH (ref 0.8–1.2)
Prothrombin Time: 23.3 seconds — ABNORMAL HIGH (ref 11.4–15.2)

## 2021-11-10 MED ORDER — ACETAMINOPHEN 325 MG PO TABS
650.0000 mg | ORAL_TABLET | Freq: Once | ORAL | Status: AC
Start: 2021-11-10 — End: 2021-11-10
  Administered 2021-11-10: 650 mg via ORAL
  Filled 2021-11-10: qty 2

## 2021-11-10 NOTE — ED Triage Notes (Signed)
Pt to ED via cardiology office. Pt fell on his way into his appt and hit his face and left elbow. Pt is on Warfarin. Pt denies LOC.

## 2021-11-10 NOTE — Discharge Instructions (Signed)
You may wear the removable splint to help with your pain.  Your x-rays and CT scans reveal no broken bones with the exception of a left-sided nasal bone fracture.  You can follow-up with ENT if your having trouble breathing or have any concerns about this.  There are no broken bones noted on the x-ray of your elbow, wrist, and hand.  Please return for any new, worsening, or change in symptoms or other concerns.  It was a pleasure caring for you today.

## 2021-11-10 NOTE — ED Notes (Signed)
Abrasions cleaned on face, left hand and both knees

## 2021-11-10 NOTE — Addendum Note (Signed)
Addended by: Michel Santee on: 11/10/2021 08:54 AM   Modules accepted: Orders

## 2021-11-10 NOTE — ED Provider Notes (Signed)
[facility] Provider Note    Event Date/Time   First MD Initiated Contact with Patient 11/10/21 0913     (approximate)   History   Fall   HPI  Michael Colon is a 88 y.o. male who presents today for evaluation after a trip and fall.  Patient reports that there was uneven sidewalk and he tripped and fell onto his left arm and struck his face on the ground.  He denies loss of consciousness.  He reports that he takes Coumadin.  He was able to get up with assistance and has been able to bear weight.  He reports that he has pain in his left wrist and left elbow.  He reports that he had an injury in 1955 and has been unable to straighten his elbow since.  His range of motion is no different today.  He denies paresthesias.  He has not had any vision changes.  No nausea or vomiting.  No chest pain, shortness of breath, abdominal pain, headache, or neck pain.  Patient Active Problem List   Diagnosis Date Noted   Leg ulcer ([place]) 11/08/2021   Complete heart block ([place]) 09/01/2021   Scalp laceration, subsequent encounter 08/25/2021   Elbow injury, left, subsequent encounter 08/25/2021   Orchitis of right testicle 11/30/2020   Right testicular pain 11/30/2020   Inguinal tenderness 11/30/2020   Onychomycosis 11/30/2020   Shingles 05/18/2020   Hematoma 03/06/2020   Chronic diastolic heart failure ([place]) 10/25/2019   Hypercalcemia 10/25/2019   Venous stasis ulcer ([place]) 08/20/2019   Chondrocalcinosis 09/05/2018   Primary osteoarthritis of right knee 08/19/2018   Aortic atherosclerosis ([place]) 12/28/2016   Chronic venous insufficiency 04/12/2016   Thrombocytopenia ([place]) 06/25/2015   Obstructive sleep apnea 06/25/2015   Enlarged prostate without lower urinary tract symptoms (luts) 10/14/2014   Advanced directives, counseling/discussion 12/13/2013   Encounter for therapeutic drug monitoring 01/30/2013   Routine general medical examination at a health care facility  12/11/2012   TIA (transient ischemic attack) 12/11/2012   Ventricular tachycardia-pause dependent 10/16/2012   Sinus bradycardia 09/07/2012   Bradycardia 09/07/2012   Edema 09/07/2012   Localized edema 09/07/2012   Mixed hyperlipidemia    Abnormal EKG 09/26/2011   Atrial flutter ([place]) 07/21/2010   Encounter for anticoagulation discussion and counseling 07/21/2010   Type 2 diabetes, controlled, with neuropathy ([place])    Tachy-brady syndrome (HCC)    BPH with obstruction/lower urinary tract symptoms    Unspecified asthma(493.90)    GERD (gastroesophageal reflux disease)    Essential hypertension           Physical Exam   Triage Vital Signs: ED Triage Vitals  Enc Vitals Group     BP 11/10/21 0851 122/83     Pulse Rate 11/10/21 0851 79     Resp 11/10/21 0851 16     Temp 11/10/21 0851 97.6 F (36.4 C)     Temp Source 11/10/21 0851 Oral     SpO2 11/10/21 0851 97 %     Weight --      Height --      Head Circumference --      Peak Flow --      Pain Score 11/10/21 0850 8     Pain Loc --      Pain Edu? --      Excl. in [place]? --     Most recent vital signs: Vitals:   11/10/21 0851  BP: 122/83  Pulse: 79  Resp: 16  Temp: 97.6 F (36.4 C)  SpO2: 97%    Physical Exam Vitals and nursing note reviewed.  Constitutional:      General: Awake and alert. No acute distress.    Appearance: Normal appearance. The patient is normal weight.  HENT:     Head: Normocephalic.  Superficial abrasions noted to forehead, nasal bridge, and bilateral maxillary bones.  No battle sign or raccoon eyes.  Mild swelling noted to the nasal bridge.  No septal hematoma.  Hematoma noted to left forehead with overlying abrasion    Mouth: Mucous membranes are moist.  Eyes:     General: PERRL. Normal EOMs        Right eye: No discharge.        Left eye: No discharge.     Conjunctiva/sclera: Conjunctivae normal.  Cardiovascular:     Rate and Rhythm: Normal rate and regular rhythm.     Pulses:  Normal pulses.     Heart sounds: Normal heart sound Pulmonary:     Effort: Pulmonary effort is normal. No respiratory distress.     Breath sounds: Normal breath sounds.  Abdominal:     Abdomen is soft. There is no abdominal tenderness. No rebound or guarding. No distention. Musculoskeletal:        General: No swelling. Normal range of motion.     Cervical back: Normal range of motion and neck supple.  Left upper extremity: No clavicular or AC joint tenderness.  No shoulder joint line tenderness.  No tenderness along the humerus.  Minimal tenderness at the level of the elbow.  Superficial abrasions noted to forearm, wrist, and fourth and fifth fingers.  Patient able to move all fingers.  Normal grip strength.  Normal range of motion of the wrist.  Limited range of motion at the level of the elbow which patient reports is his baseline.  Normal radial pulse.  Compartment soft and compressible throughout. Skin:    General: Skin is warm and dry.     Capillary Refill: Capillary refill takes less than 2 seconds.     Findings: No rash.  Neurological:     Mental Status: The patient is awake and alert.      ED Results / Procedures / Treatments   Labs (all labs ordered are listed, but only abnormal results are displayed) Labs Reviewed  PROTIME-INR - Abnormal; Notable for the following components:      Result Value   Prothrombin Time 23.3 (*)    INR 2.1 (*)    All other components within normal limits     EKG     RADIOLOGY I independently reviewed and interpreted imaging and agree with radiologists findings.     PROCEDURES:  Critical Care performed:   Procedures   MEDICATIONS ORDERED IN ED: Medications  acetaminophen (TYLENOL) tablet 650 mg (650 mg Oral Given 11/10/21 0938)     IMPRESSION / MDM / ASSESSMENT AND PLAN / ED COURSE  I reviewed the triage vital signs and the nursing notes.   Differential diagnosis includes, but is not limited to, abrasion, contusion,  intracranial hemorrhage, cervical spine injury, fracture, dislocation, hematoma.  Patient is awake and alert, hemodynamically stable and neurovascularly intact.  X-rays obtained of his elbow and hand/wrist are reassuring.  CT head and neck obtained per [place] criteria and are negative for any acute findings.  Given that he is on Coumadin, INR was checked and is in his therapeutic range.  Patient is reassured by this.  He was given a wrist splint  for extra support given his pain in his wrist.  His wounds were and bandaged by RN.  He was reassured by his findings today.  We discussed return precautions and the importance of close outpatient follow-up.  Patient understands and agrees with plan.  He was discharged in stable condition.   Patient's presentation is most consistent with acute presentation with potential threat to life or bodily function.    FINAL CLINICAL IMPRESSION(S) / ED DIAGNOSES   Final diagnoses:  Fall, initial encounter  Facial abrasion, initial encounter  Contusion of elbow and forearm, left, initial encounter  Contusion of wrist, initial encounter  Closed fracture of nasal bone, initial encounter  Abrasion of left hand, initial encounter     Rx / DC Orders   ED Discharge Orders     None        Note:  This document was prepared using Dragon voice recognition software and may include unintentional dictation errors.   Emeline Gins 11/10/21 1208    Harvest Dark, MD 11/10/21 1453

## 2021-11-10 NOTE — Patient Instructions (Signed)
Medication Instructions:  No changes  If you need a refill on your cardiac medications before your next appointment, please call your pharmacy.    Lab work: No new labs needed   Testing/Procedures: No new testing needed   Follow-Up: At [facility], you and your health needs are our priority.  As part of our continuing mission to provide you with exceptional heart care, we have created designated Provider Care Teams.  These Care Teams include your primary Cardiologist (physician) and Advanced Practice Providers (APPs -  Physician Assistants and Nurse Practitioners) who all work together to provide you with the care you need, when you need it.  You will need a follow up appointment in 6 months  Providers on your designated Care Team:   Christopher Berge, NP Ryan Dunn, PA-C Cadence Furth, PA-C  COVID-19 Vaccine Information can be found at: [email or web address] For questions related to vaccine distribution or appointments, please email [email or web address] or call [phone].   

## 2021-11-11 DIAGNOSIS — L309 Dermatitis, unspecified: Secondary | ICD-10-CM | POA: Diagnosis not present

## 2021-11-11 DIAGNOSIS — L0292 Furuncle, unspecified: Secondary | ICD-10-CM | POA: Diagnosis not present

## 2021-11-11 DIAGNOSIS — L02821 Furuncle of head [any part, except face]: Secondary | ICD-10-CM | POA: Diagnosis not present

## 2021-11-12 ENCOUNTER — Telehealth: Payer: Self-pay

## 2021-11-12 ENCOUNTER — Telehealth: Payer: Self-pay | Admitting: Cardiovascular Disease

## 2021-11-12 DIAGNOSIS — M19122 Post-traumatic osteoarthritis, left elbow: Secondary | ICD-10-CM | POA: Diagnosis not present

## 2021-11-12 DIAGNOSIS — S52022A Displaced fracture of olecranon process without intraarticular extension of left ulna, initial encounter for closed fracture: Secondary | ICD-10-CM | POA: Diagnosis not present

## 2021-11-12 NOTE — Telephone Encounter (Signed)
-----   Message from Michael Filbert, RN sent at 11/10/2021  5:08 PM EST ----- Patient seen by Dr. Rockey Situ today, but went to the ER for a fall so he bypassed checkout.  Can someone please reach out to him to set up a 6 month f/u- that's all her needed.   Thanks!

## 2021-11-12 NOTE — Telephone Encounter (Signed)
Left message on VM asking pt to call us with an update on how he is doing after his recent ER visit 11-10-21 for a fall. If he calls back, please see how he is doing and note it here. Thanks.

## 2021-11-12 NOTE — Telephone Encounter (Signed)
Called to schedule 6 month Call dropped

## 2021-11-29 DIAGNOSIS — M19122 Post-traumatic osteoarthritis, left elbow: Secondary | ICD-10-CM | POA: Diagnosis not present

## 2021-11-30 ENCOUNTER — Encounter: Payer: Self-pay | Admitting: Internal Medicine

## 2021-11-30 ENCOUNTER — Ambulatory Visit (INDEPENDENT_AMBULATORY_CARE_PROVIDER_SITE_OTHER): Payer: Medicare Other | Admitting: Internal Medicine

## 2021-11-30 VITALS — BP 122/82 | HR 65 | Temp 97.4°F | Ht 70.0 in | Wt 202.0 lb

## 2021-11-30 DIAGNOSIS — T148XXA Other injury of unspecified body region, initial encounter: Secondary | ICD-10-CM | POA: Diagnosis not present

## 2021-11-30 DIAGNOSIS — S42402A Unspecified fracture of lower end of left humerus, initial encounter for closed fracture: Secondary | ICD-10-CM | POA: Diagnosis not present

## 2021-11-30 NOTE — Assessment & Plan Note (Signed)
In splint from ortho X-ray yesterday shows minimal callous Will go back for new cast/splint

## 2021-11-30 NOTE — Progress Notes (Signed)
Subjective:    Patient ID: Michael Colon, male    DOB: July 17, 1930, 88 y.o.   MRN: [id number]  HPI Here for follow up after fall  Walking on uneven sidewalk-- entrance [facility] (almost 3 weeks ago) Did face plant and skidded some Bruised up face and scabbed hands and knees ER evaluation showed no CNS bleed, etc Elbow x-ray okay--but then went to ortho and he found a fracture in the elbow No LOC Fractured in nose   He doesn't feel that he has any trouble walking Only fell due to sidewalk issue  Right knee is worse since fall More trouble with ankle pronation Right shoulder pain worsened as well  No pain now Some tenderness over ongoing hematoma on forehead  Current Outpatient Medications on File Prior to Visit  Medication Sig Dispense Refill   APPLE CIDER VINEGAR PO Take 1 capsule by mouth at bedtime.     atorvastatin (LIPITOR) 80 MG tablet Take 1 tablet (80 mg total) by mouth daily. 90 tablet 3   cetirizine (ZYRTEC) 10 MG tablet Take 10 mg by mouth daily.     Cyanocobalamin (B-12 PO) Place 1 tablet under the tongue daily.     docusate sodium (COLACE) 100 MG capsule Take 100 mg by mouth 2 (two) times daily.     finasteride (PROSCAR) 5 MG tablet TAKE 1 TABLET BY MOUTH ONCE DAILY 90 tablet 3   ibuprofen (ADVIL,MOTRIN) 200 MG tablet Take 200 mg by mouth 2 (two) times daily.     loratadine (CLARITIN) 10 MG tablet Take 10 mg by mouth daily.     Magnesium Oxide 400 MG CAPS Take 1 capsule (400 mg total) by mouth daily. 90 capsule 3   metFORMIN (GLUCOPHAGE) 500 MG tablet TAKE 1 TABLET BY MOUTH  TWICE DAILY WITH MEALS (Patient taking differently: Take 500 mg by mouth 2 (two) times daily.) 180 tablet 3   Polyethyl Glycol-Propyl Glycol (SYSTANE ULTRA OP) Place 1 drop into both eyes daily as needed (dry eyes).     silver sulfADIAZINE (SILVADENE) 1 % cream Apply 1 Application topically daily. 50 g 0   tamsulosin (FLOMAX) 0.4 MG CAPS capsule TAKE 1 CAPSULE BY MOUTH  DAILY  100 capsule 3   triamterene-hydrochlorothiazide (DYAZIDE) 37.5-25 MG capsule TAKE 1 CAPSULE BY MOUTH IN  THE MORNING 100 capsule 3   warfarin (COUMADIN) 5 MG tablet TAKE 1 TO 1 AND 1/2 TABLETS BY MOUTH DAILY AS DIRECTED  BY COUMADIN CLINIC (Patient taking differently: Take 5-7.5 mg by mouth See admin instructions. 7.5 mg at bedtime on Sunday, Tuesday, Thursday 5 mg at bedtime on Monday, Wednesday, Friday, Saturday) 135 tablet 3   furosemide (LASIX) 20 MG tablet Take 1 tablet (20 mg total) by mouth daily as needed (Extra pill as needed for weight 215 or higher). Once daily and extra pill as needed for weight 215 or higher. (Patient not taking: Reported on 09/01/2021) 180 tablet 0   No current facility-administered medications on file prior to visit.    Allergies  Allergen Reactions   Lovenox [Enoxaparin] Hives   Proscar [Finasteride] Other (See Comments)    Upper body and arm weakness   Latex Rash    Has trouble with BANDAIDS that have been left on for more than 24 hours. Prefers paper tape.   Nickel Rash    Localized rash   Percocet [Oxycodone-Acetaminophen] Rash   Tape Rash and Other (See Comments)    Sensitivity     Past Medical History:  Diagnosis Date   Asthma    Atrial flutter ([place]) 2007   Band keratopathy    Benign prostatic hypertrophy    Chronic ear infection    Chronic kidney disease    Chronic prostatitis    Dental bridge present    permanent - upper   Diabetes mellitus    Elbow stiffness, left    s/p fracture many yrs ago.  arm does not straighten   GERD (gastroesophageal reflux disease)    laryngeal involvement   Hyperlipidemia    Hypertension    Obstructive sleep apnea    CPAP-9   Osteoarthrosis, localized, primary, knee    post-traumatic   Prostatitis    Stroke ([place])    TIA   Tachy-brady syndrome ([place])    UTI (urinary tract infection)     Past Surgical History:  Procedure Laterality Date   APPENDECTOMY     BELPHAROPTOSIS REPAIR     Dr  [name]---didn't resolve weepy eye and eyelid drooping   CARDIOVERSION  04/13/2010   CATARACT EXTRACTION, BILATERAL  2009   Chest pain  8/12   Stress test benign   ESOPHAGEAL DILATION  05/26/2016   Procedure: ESOPHAGEAL DILATION;  Surgeon: Lucilla Lame, MD;  Location: [facility];  Service: Endoscopy;;   ESOPHAGOGASTRODUODENOSCOPY (EGD) WITH PROPOFOL N/A 05/26/2016   Procedure: ESOPHAGOGASTRODUODENOSCOPY (EGD) WITH PROPOFOL;  Surgeon: Lucilla Lame, MD;  Location: [facility];  Service: Endoscopy;  Laterality: N/A;  Diabetic - oral meds sleep apnea   EYE SURGERY     FRACTURE SURGERY Left    elbow   KNEE SURGERY  1998   plate after fracture, then removed for infection   left elbow surgery     MASTOIDECTOMY  8/08   Dr Idelle Crouch   PACEMAKER IMPLANT N/A 09/02/2021   Procedure: PACEMAKER IMPLANT;  Surgeon: Vickie Epley, MD;  Location: [facility];  Service: Cardiovascular;  Laterality: N/A;   RHINOPLASTY  5/10   and septoplasty   SUBACROMIAL DECOMPRESSION Right 2005   Arthroscopic (for rotator cuff and biceps tendon ruptures)   TEAR DUCT PROBING  11/13   Dr Vickki Muff   TOTAL KNEE ARTHROPLASTY Left 09/01/2014   Procedure: TOTAL KNEE ARTHROPLASTY;  Surgeon: Dereck Leep, MD;  Location: [facility];  Service: Orthopedics;  Laterality: Left;   TRIGGER FINGER RELEASE Left 02/25/2015   Procedure: LEFT LONG TRIGGER RELEASE;  Surgeon: Dereck Leep, MD;  Location: [facility];  Service: Orthopedics;  Laterality: Left;   VENOUS ABLATION      Family History  Problem Relation Age of Onset   Colon cancer Father    Diabetes Father    Hypertension Father    Other Mother        natural causes   Heart attack Neg Hx    Stroke Neg Hx     Social History   Socioeconomic History   Marital status: Widowed    Spouse name: Not on file   Number of children: 2   Years of education: Not on file   Highest education level: Not on file  Occupational History   Occupation: Theme park manager     Comment: Retired--[employer]  Tobacco Use   Smoking status: Former    Packs/day: 1.00    Years: 0.00    Total pack years: 0.00    Types: Cigarettes    Quit date: 01/03/1969    Years since quitting: 52.9    Passive exposure: Never   Smokeless tobacco: Never  Vaping Use   Vaping  Use: Never used  Substance and Sexual Activity   Alcohol use: No    Alcohol/week: 1.0 standard drink of alcohol    Types: 1 Glasses of wine per week    Comment: rare wine. 1-2x/yr.   Drug use: No   Sexual activity: Not on file  Other Topics Concern   Not on file  Social History Narrative   Has living will   Health care POA-- son Shanon Brow   Has DNR order from the past---form redone 06/25/10   Probably no feeding tube if cognitively unaware   Social Determinants of Health   Financial Resource Strain: Not on file  Food Insecurity: Not on file  Transportation Needs: Not on file  Physical Activity: Not on file  Stress: Not on file  Social Connections: Not on file  Intimate Partner Violence: Not on file   Review of Systems Just used tylenol---cutting back now Eating okay Has frozen meals to heaten up Still able to drive--will go get take out or eat in a restaurant Has home care to help him dress and bathe--periodically (through [facility])     Objective:   Physical Exam HENT:     Head:     Comments: 3cm hematoma over medial left eye Bruising along nose and cheeks Musculoskeletal:     Comments: Left elbow in splint  Neurological:     Mental Status: He is alert.            Assessment & Plan:

## 2021-11-30 NOTE — Assessment & Plan Note (Signed)
On face Discussed natural history of resolution--will get smaller and harder No infection

## 2021-12-02 ENCOUNTER — Ambulatory Visit: Payer: Medicare Other | Admitting: Podiatry

## 2021-12-02 ENCOUNTER — Encounter: Payer: Self-pay | Admitting: Podiatry

## 2021-12-02 DIAGNOSIS — M79674 Pain in right toe(s): Secondary | ICD-10-CM | POA: Diagnosis not present

## 2021-12-02 DIAGNOSIS — B351 Tinea unguium: Secondary | ICD-10-CM | POA: Diagnosis not present

## 2021-12-02 DIAGNOSIS — I872 Venous insufficiency (chronic) (peripheral): Secondary | ICD-10-CM

## 2021-12-02 DIAGNOSIS — E1140 Type 2 diabetes mellitus with diabetic neuropathy, unspecified: Secondary | ICD-10-CM

## 2021-12-02 DIAGNOSIS — M79675 Pain in left toe(s): Secondary | ICD-10-CM | POA: Diagnosis not present

## 2021-12-02 NOTE — Progress Notes (Signed)
This patient returns to my office for at risk foot care.  This patient requires this care by a professional since this patient will be at risk due to having diabetic neuropathy.  This patient is unable to cut nails himself since the patient cannot reach his nails.These nails are painful walking and wearing shoes.  This patient presents for at risk foot care today.  General Appearance  Alert, conversant and in no acute stress.  Vascular  Dorsalis pedis and posterior tibial  pulses are  weakly  palpable  bilaterally.  Capillary return is within normal limits  bilaterally. Cold feet  bilaterally.  Neurologic  Senn-Weinstein monofilament wire test within normal limits  bilaterally. Muscle power within normal limits bilaterally.  Nails Thick disfigured discolored nails with subungual debris  from hallux to fifth toes bilaterally. No evidence of bacterial infection or drainage bilaterally.  Orthopedic  No limitations of motion  feet .  No crepitus or effusions noted.  No bony pathology or digital deformities noted.  Skin  normotropic skin with no porokeratosis noted bilaterally.  No signs of infections or ulcers noted.     Onychomycosis  Pain in right toes  Pain in left toes  Consent was obtained for treatment procedures.   Mechanical debridement of nails 1-5  bilaterally performed with a nail nipper.  Filed with dremel without incident.    Return office visit   3 months                  Told patient to return for periodic foot care and evaluation due to potential at risk complications.   Gailya Tauer DPM   

## 2021-12-03 ENCOUNTER — Emergency Department: Payer: Medicare Other

## 2021-12-03 ENCOUNTER — Other Ambulatory Visit: Payer: Medicare Other

## 2021-12-03 DIAGNOSIS — Z950 Presence of cardiac pacemaker: Secondary | ICD-10-CM

## 2021-12-03 DIAGNOSIS — E119 Type 2 diabetes mellitus without complications: Secondary | ICD-10-CM | POA: Diagnosis not present

## 2021-12-03 DIAGNOSIS — I499 Cardiac arrhythmia, unspecified: Secondary | ICD-10-CM | POA: Diagnosis not present

## 2021-12-03 DIAGNOSIS — I442 Atrioventricular block, complete: Secondary | ICD-10-CM | POA: Diagnosis not present

## 2021-12-03 DIAGNOSIS — Z791 Long term (current) use of non-steroidal anti-inflammatories (NSAID): Secondary | ICD-10-CM

## 2021-12-03 DIAGNOSIS — E785 Hyperlipidemia, unspecified: Secondary | ICD-10-CM | POA: Diagnosis present

## 2021-12-03 DIAGNOSIS — S42291A Other displaced fracture of upper end of right humerus, initial encounter for closed fracture: Principal | ICD-10-CM | POA: Diagnosis present

## 2021-12-03 DIAGNOSIS — Z79899 Other long term (current) drug therapy: Secondary | ICD-10-CM

## 2021-12-03 DIAGNOSIS — Z8673 Personal history of transient ischemic attack (TIA), and cerebral infarction without residual deficits: Secondary | ICD-10-CM

## 2021-12-03 DIAGNOSIS — S0083XA Contusion of other part of head, initial encounter: Secondary | ICD-10-CM | POA: Diagnosis present

## 2021-12-03 DIAGNOSIS — Z7901 Long term (current) use of anticoagulants: Secondary | ICD-10-CM

## 2021-12-03 DIAGNOSIS — S42251A Displaced fracture of greater tuberosity of right humerus, initial encounter for closed fracture: Secondary | ICD-10-CM | POA: Diagnosis not present

## 2021-12-03 DIAGNOSIS — S022XXA Fracture of nasal bones, initial encounter for closed fracture: Secondary | ICD-10-CM | POA: Diagnosis not present

## 2021-12-03 DIAGNOSIS — Z833 Family history of diabetes mellitus: Secondary | ICD-10-CM

## 2021-12-03 DIAGNOSIS — I110 Hypertensive heart disease with heart failure: Secondary | ICD-10-CM | POA: Diagnosis not present

## 2021-12-03 DIAGNOSIS — Z743 Need for continuous supervision: Secondary | ICD-10-CM | POA: Diagnosis not present

## 2021-12-03 DIAGNOSIS — N138 Other obstructive and reflux uropathy: Secondary | ICD-10-CM | POA: Diagnosis present

## 2021-12-03 DIAGNOSIS — R220 Localized swelling, mass and lump, head: Secondary | ICD-10-CM | POA: Diagnosis not present

## 2021-12-03 DIAGNOSIS — Z91048 Other nonmedicinal substance allergy status: Secondary | ICD-10-CM

## 2021-12-03 DIAGNOSIS — Z7984 Long term (current) use of oral hypoglycemic drugs: Secondary | ICD-10-CM

## 2021-12-03 DIAGNOSIS — I130 Hypertensive heart and chronic kidney disease with heart failure and stage 1 through stage 4 chronic kidney disease, or unspecified chronic kidney disease: Secondary | ICD-10-CM | POA: Diagnosis not present

## 2021-12-03 DIAGNOSIS — N401 Enlarged prostate with lower urinary tract symptoms: Secondary | ICD-10-CM | POA: Diagnosis present

## 2021-12-03 DIAGNOSIS — G4733 Obstructive sleep apnea (adult) (pediatric): Secondary | ICD-10-CM | POA: Diagnosis present

## 2021-12-03 DIAGNOSIS — J45909 Unspecified asthma, uncomplicated: Secondary | ICD-10-CM | POA: Diagnosis not present

## 2021-12-03 DIAGNOSIS — Z8249 Family history of ischemic heart disease and other diseases of the circulatory system: Secondary | ICD-10-CM

## 2021-12-03 DIAGNOSIS — M25511 Pain in right shoulder: Secondary | ICD-10-CM | POA: Diagnosis not present

## 2021-12-03 DIAGNOSIS — Z888 Allergy status to other drugs, medicaments and biological substances status: Secondary | ICD-10-CM

## 2021-12-03 DIAGNOSIS — M19022 Primary osteoarthritis, left elbow: Secondary | ICD-10-CM | POA: Diagnosis not present

## 2021-12-03 DIAGNOSIS — S42201A Unspecified fracture of upper end of right humerus, initial encounter for closed fracture: Secondary | ICD-10-CM | POA: Diagnosis not present

## 2021-12-03 DIAGNOSIS — S4981XA Other specified injuries of right shoulder and upper arm, initial encounter: Secondary | ICD-10-CM | POA: Diagnosis not present

## 2021-12-03 DIAGNOSIS — K219 Gastro-esophageal reflux disease without esophagitis: Secondary | ICD-10-CM | POA: Diagnosis present

## 2021-12-03 DIAGNOSIS — Z800 Family history of malignant neoplasm of digestive organs: Secondary | ICD-10-CM | POA: Diagnosis not present

## 2021-12-03 DIAGNOSIS — Z91040 Latex allergy status: Secondary | ICD-10-CM

## 2021-12-03 DIAGNOSIS — Y92098 Other place in other non-institutional residence as the place of occurrence of the external cause: Secondary | ICD-10-CM

## 2021-12-03 DIAGNOSIS — Z87891 Personal history of nicotine dependence: Secondary | ICD-10-CM

## 2021-12-03 DIAGNOSIS — R6889 Other general symptoms and signs: Secondary | ICD-10-CM | POA: Diagnosis not present

## 2021-12-03 DIAGNOSIS — I480 Paroxysmal atrial fibrillation: Secondary | ICD-10-CM | POA: Diagnosis not present

## 2021-12-03 DIAGNOSIS — K5900 Constipation, unspecified: Secondary | ICD-10-CM | POA: Diagnosis not present

## 2021-12-03 DIAGNOSIS — N390 Urinary tract infection, site not specified: Secondary | ICD-10-CM | POA: Diagnosis not present

## 2021-12-03 DIAGNOSIS — W010XXA Fall on same level from slipping, tripping and stumbling without subsequent striking against object, initial encounter: Secondary | ICD-10-CM | POA: Diagnosis present

## 2021-12-03 DIAGNOSIS — S99912D Unspecified injury of left ankle, subsequent encounter: Secondary | ICD-10-CM

## 2021-12-03 DIAGNOSIS — R0689 Other abnormalities of breathing: Secondary | ICD-10-CM | POA: Diagnosis not present

## 2021-12-03 DIAGNOSIS — B9620 Unspecified Escherichia coli [E. coli] as the cause of diseases classified elsewhere: Secondary | ICD-10-CM | POA: Diagnosis present

## 2021-12-03 DIAGNOSIS — Z96652 Presence of left artificial knee joint: Secondary | ICD-10-CM | POA: Diagnosis present

## 2021-12-03 DIAGNOSIS — I482 Chronic atrial fibrillation, unspecified: Secondary | ICD-10-CM | POA: Diagnosis present

## 2021-12-03 DIAGNOSIS — S0012XA Contusion of left eyelid and periocular area, initial encounter: Secondary | ICD-10-CM | POA: Diagnosis not present

## 2021-12-03 DIAGNOSIS — M19021 Primary osteoarthritis, right elbow: Secondary | ICD-10-CM | POA: Diagnosis not present

## 2021-12-03 DIAGNOSIS — I5032 Chronic diastolic (congestive) heart failure: Secondary | ICD-10-CM | POA: Diagnosis not present

## 2021-12-03 DIAGNOSIS — Z885 Allergy status to narcotic agent status: Secondary | ICD-10-CM

## 2021-12-03 DIAGNOSIS — Z043 Encounter for examination and observation following other accident: Secondary | ICD-10-CM | POA: Diagnosis not present

## 2021-12-03 LAB — URINALYSIS, ROUTINE W REFLEX MICROSCOPIC
Bilirubin Urine: NEGATIVE
Glucose, UA: NEGATIVE mg/dL
Hgb urine dipstick: NEGATIVE
Ketones, ur: 5 mg/dL — AB
Nitrite: POSITIVE — AB
Protein, ur: NEGATIVE mg/dL
Specific Gravity, Urine: 1.014 (ref 1.005–1.030)
WBC, UA: >50 WBC/hpf — ABNORMAL HIGH (ref 0–5)
pH: 6 (ref 5.0–8.0)

## 2021-12-03 NOTE — ED Notes (Addendum)
First nurse note:  BIB[facility] from [facility]. EMS reports fall in hallway, unwitnessed. + blood thinners. Reports hitting head but denies LOC. Pt refused to go to [facility] per report. Pt alert and oriented. Bruising to face from prior fall in November. EMS reports hematoma to head, bleeding controlled. R arm pain reported by pt but per EMS report pt would not let EMS assess on scene. L arm in sling from prior fall.   200/100 HR 76 96% RA BGL 220

## 2021-12-03 NOTE — ED Triage Notes (Signed)
Pt from [facility], BIB [facility] d/t unwitnessed fall in hallway, pt is on Coumadin 5 mg. Pt hit his head, denies LOC. Pt had recent fall a few weeks ago, has old bruising to face and left arm is in a ortho sling. Pt has abrasion to left forehead, hematoma noted. EMS reports pain to right arm, pt refusing to let this RN assess his right arm. Pt cries out when RN tries to extend his right arm. Pt A&O x4. VSS.

## 2021-12-03 NOTE — ED Notes (Signed)
Please call pts son Shafiq Larch @ [phone] with update

## 2021-12-04 ENCOUNTER — Other Ambulatory Visit: Payer: Self-pay

## 2021-12-04 ENCOUNTER — Inpatient Hospital Stay
Admission: EM | Admit: 2021-12-04 | Discharge: 2021-12-07 | DRG: 563 | Disposition: A | Discharge status: Skilled Nursing Facility | Payer: Medicare Other | Source: Skilled Nursing Facility | Attending: Internal Medicine | Admitting: Internal Medicine

## 2021-12-04 DIAGNOSIS — I5032 Chronic diastolic (congestive) heart failure: Secondary | ICD-10-CM | POA: Diagnosis not present

## 2021-12-04 DIAGNOSIS — R278 Other lack of coordination: Secondary | ICD-10-CM | POA: Diagnosis not present

## 2021-12-04 DIAGNOSIS — S42291A Other displaced fracture of upper end of right humerus, initial encounter for closed fracture: Secondary | ICD-10-CM | POA: Diagnosis present

## 2021-12-04 DIAGNOSIS — S42209A Unspecified fracture of upper end of unspecified humerus, initial encounter for closed fracture: Secondary | ICD-10-CM | POA: Diagnosis not present

## 2021-12-04 DIAGNOSIS — Z7984 Long term (current) use of oral hypoglycemic drugs: Secondary | ICD-10-CM | POA: Diagnosis not present

## 2021-12-04 DIAGNOSIS — J45909 Unspecified asthma, uncomplicated: Secondary | ICD-10-CM | POA: Diagnosis present

## 2021-12-04 DIAGNOSIS — W010XXA Fall on same level from slipping, tripping and stumbling without subsequent striking against object, initial encounter: Secondary | ICD-10-CM | POA: Diagnosis present

## 2021-12-04 DIAGNOSIS — E119 Type 2 diabetes mellitus without complications: Secondary | ICD-10-CM

## 2021-12-04 DIAGNOSIS — N138 Other obstructive and reflux uropathy: Secondary | ICD-10-CM

## 2021-12-04 DIAGNOSIS — N390 Urinary tract infection, site not specified: Secondary | ICD-10-CM | POA: Diagnosis not present

## 2021-12-04 DIAGNOSIS — I482 Chronic atrial fibrillation, unspecified: Secondary | ICD-10-CM | POA: Diagnosis not present

## 2021-12-04 DIAGNOSIS — E785 Hyperlipidemia, unspecified: Secondary | ICD-10-CM | POA: Diagnosis present

## 2021-12-04 DIAGNOSIS — K5900 Constipation, unspecified: Secondary | ICD-10-CM | POA: Diagnosis present

## 2021-12-04 DIAGNOSIS — I130 Hypertensive heart and chronic kidney disease with heart failure and stage 1 through stage 4 chronic kidney disease, or unspecified chronic kidney disease: Secondary | ICD-10-CM | POA: Diagnosis present

## 2021-12-04 DIAGNOSIS — S42402D Unspecified fracture of lower end of left humerus, subsequent encounter for fracture with routine healing: Secondary | ICD-10-CM | POA: Diagnosis not present

## 2021-12-04 DIAGNOSIS — W19XXXA Unspecified fall, initial encounter: Secondary | ICD-10-CM | POA: Diagnosis present

## 2021-12-04 DIAGNOSIS — I10 Essential (primary) hypertension: Secondary | ICD-10-CM

## 2021-12-04 DIAGNOSIS — I480 Paroxysmal atrial fibrillation: Secondary | ICD-10-CM | POA: Diagnosis present

## 2021-12-04 DIAGNOSIS — E782 Mixed hyperlipidemia: Secondary | ICD-10-CM | POA: Insufficient documentation

## 2021-12-04 DIAGNOSIS — B9620 Unspecified Escherichia coli [E. coli] as the cause of diseases classified elsewhere: Secondary | ICD-10-CM | POA: Diagnosis present

## 2021-12-04 DIAGNOSIS — S42201A Unspecified fracture of upper end of right humerus, initial encounter for closed fracture: Secondary | ICD-10-CM | POA: Diagnosis not present

## 2021-12-04 DIAGNOSIS — N401 Enlarged prostate with lower urinary tract symptoms: Secondary | ICD-10-CM | POA: Diagnosis present

## 2021-12-04 DIAGNOSIS — Y92098 Other place in other non-institutional residence as the place of occurrence of the external cause: Secondary | ICD-10-CM | POA: Diagnosis not present

## 2021-12-04 DIAGNOSIS — K219 Gastro-esophageal reflux disease without esophagitis: Secondary | ICD-10-CM | POA: Diagnosis present

## 2021-12-04 DIAGNOSIS — S0083XA Contusion of other part of head, initial encounter: Secondary | ICD-10-CM | POA: Diagnosis present

## 2021-12-04 DIAGNOSIS — I639 Cerebral infarction, unspecified: Secondary | ICD-10-CM | POA: Diagnosis present

## 2021-12-04 DIAGNOSIS — S0012XA Contusion of left eyelid and periocular area, initial encounter: Secondary | ICD-10-CM | POA: Diagnosis present

## 2021-12-04 DIAGNOSIS — M19022 Primary osteoarthritis, left elbow: Secondary | ICD-10-CM | POA: Diagnosis present

## 2021-12-04 DIAGNOSIS — I110 Hypertensive heart disease with heart failure: Secondary | ICD-10-CM | POA: Diagnosis not present

## 2021-12-04 DIAGNOSIS — Z950 Presence of cardiac pacemaker: Secondary | ICD-10-CM | POA: Diagnosis not present

## 2021-12-04 DIAGNOSIS — I442 Atrioventricular block, complete: Secondary | ICD-10-CM | POA: Diagnosis not present

## 2021-12-04 DIAGNOSIS — S42201D Unspecified fracture of upper end of right humerus, subsequent encounter for fracture with routine healing: Secondary | ICD-10-CM | POA: Diagnosis not present

## 2021-12-04 DIAGNOSIS — R2681 Unsteadiness on feet: Secondary | ICD-10-CM | POA: Diagnosis not present

## 2021-12-04 DIAGNOSIS — Z743 Need for continuous supervision: Secondary | ICD-10-CM | POA: Diagnosis not present

## 2021-12-04 DIAGNOSIS — M6281 Muscle weakness (generalized): Secondary | ICD-10-CM | POA: Diagnosis not present

## 2021-12-04 DIAGNOSIS — Z7901 Long term (current) use of anticoagulants: Secondary | ICD-10-CM | POA: Diagnosis not present

## 2021-12-04 DIAGNOSIS — R531 Weakness: Secondary | ICD-10-CM | POA: Diagnosis not present

## 2021-12-04 DIAGNOSIS — Z741 Need for assistance with personal care: Secondary | ICD-10-CM | POA: Diagnosis not present

## 2021-12-04 DIAGNOSIS — Z800 Family history of malignant neoplasm of digestive organs: Secondary | ICD-10-CM | POA: Diagnosis not present

## 2021-12-04 DIAGNOSIS — Z79899 Other long term (current) drug therapy: Secondary | ICD-10-CM | POA: Diagnosis not present

## 2021-12-04 HISTORY — DX: Unspecified fall, initial encounter: W19.XXXA

## 2021-12-04 LAB — CBC
HCT: 32.9 % — ABNORMAL LOW (ref 39.0–52.0)
Hemoglobin: 11 g/dL — ABNORMAL LOW (ref 13.0–17.0)
MCH: 31.3 pg (ref 26.0–34.0)
MCHC: 33.4 g/dL (ref 30.0–36.0)
MCV: 93.7 fL (ref 80.0–100.0)
Platelets: 157 10*3/uL (ref 150–400)
RBC: 3.51 MIL/uL — ABNORMAL LOW (ref 4.22–5.81)
RDW: 13.4 % (ref 11.5–15.5)
WBC: 9.5 10*3/uL (ref 4.0–10.5)
nRBC: 0 % (ref 0.0–0.2)

## 2021-12-04 LAB — BASIC METABOLIC PANEL
Anion gap: 8 (ref 5–15)
Anion gap: 6 (ref 5–15)
BUN: 26 mg/dL — ABNORMAL HIGH (ref 8–23)
BUN: 25 mg/dL — ABNORMAL HIGH (ref 8–23)
CO2: 23 mmol/L (ref 22–32)
CO2: 25 mmol/L (ref 22–32)
Calcium: 10.5 mg/dL — ABNORMAL HIGH (ref 8.9–10.3)
Calcium: 9.8 mg/dL (ref 8.9–10.3)
Chloride: 106 mmol/L (ref 98–111)
Chloride: 105 mmol/L (ref 98–111)
Creatinine, Ser: 0.77 mg/dL (ref 0.61–1.24)
Creatinine, Ser: 0.79 mg/dL (ref 0.61–1.24)
GFR, Estimated: >60 mL/min (ref >60)
GFR, Estimated: >60 mL/min (ref >60)
Glucose, Bld: 186 mg/dL — ABNORMAL HIGH (ref 70–99)
Glucose, Bld: 164 mg/dL — ABNORMAL HIGH (ref 70–99)
Potassium: 3.7 mmol/L (ref 3.5–5.1)
Potassium: 3.6 mmol/L (ref 3.5–5.1)
Sodium: 137 mmol/L (ref 135–145)
Sodium: 136 mmol/L (ref 135–145)

## 2021-12-04 LAB — CBC WITH DIFFERENTIAL/PLATELET
Abs Immature Granulocytes: 0.04 10*3/uL (ref 0.00–0.07)
Basophils Absolute: 0 10*3/uL (ref 0.0–0.1)
Basophils Relative: 0 %
Eosinophils Absolute: 0 10*3/uL (ref 0.0–0.5)
Eosinophils Relative: 0 %
HCT: 34.8 % — ABNORMAL LOW (ref 39.0–52.0)
Hemoglobin: 12 g/dL — ABNORMAL LOW (ref 13.0–17.0)
Immature Granulocytes: 0 %
Lymphocytes Relative: 7 %
Lymphs Abs: 0.8 10*3/uL (ref 0.7–4.0)
MCH: 31.8 pg (ref 26.0–34.0)
MCHC: 34.5 g/dL (ref 30.0–36.0)
MCV: 92.3 fL (ref 80.0–100.0)
Monocytes Absolute: 1.2 10*3/uL — ABNORMAL HIGH (ref 0.1–1.0)
Monocytes Relative: 10 %
Neutro Abs: 10.2 10*3/uL — ABNORMAL HIGH (ref 1.7–7.7)
Neutrophils Relative %: 83 %
Platelets: 174 10*3/uL (ref 150–400)
RBC: 3.77 MIL/uL — ABNORMAL LOW (ref 4.22–5.81)
RDW: 13.2 % (ref 11.5–15.5)
WBC: 12.3 10*3/uL — ABNORMAL HIGH (ref 4.0–10.5)
nRBC: 0 % (ref 0.0–0.2)

## 2021-12-04 LAB — CBG MONITORING, ED: Glucose-Capillary: 221 mg/dL — ABNORMAL HIGH (ref 70–99)

## 2021-12-04 LAB — BRAIN NATRIURETIC PEPTIDE: B Natriuretic Peptide: 228 pg/mL — ABNORMAL HIGH (ref 0.0–100.0)

## 2021-12-04 LAB — GLUCOSE, CAPILLARY: Glucose-Capillary: 226 mg/dL — ABNORMAL HIGH (ref 70–99)

## 2021-12-04 MED ORDER — ACETAMINOPHEN 325 MG PO TABS: 650.0000 mg | ORAL_TABLET | Freq: Four times a day (QID) | ORAL | Status: DC | PRN

## 2021-12-04 MED ORDER — LORATADINE 10 MG PO TABS
10.0000 mg | ORAL_TABLET | Freq: Every day | ORAL | Status: DC
  Administered 2021-12-05 – 2021-12-07 (×3): 10 mg via ORAL
  Filled 2021-12-04 (×4): qty 1

## 2021-12-04 MED ORDER — POLYETHYLENE GLYCOL 3350 17 G PO PACK: 17.0000 g | PACK | Freq: Every day | ORAL | Status: DC | PRN

## 2021-12-04 MED ORDER — MAGNESIUM OXIDE -MG SUPPLEMENT 400 (240 MG) MG PO TABS
400.0000 mg | ORAL_TABLET | Freq: Every day | ORAL | Status: DC
  Administered 2021-12-04 – 2021-12-07 (×4): 400 mg via ORAL
  Filled 2021-12-04 (×6): qty 1

## 2021-12-04 MED ORDER — SODIUM CHLORIDE 0.9 % IV SOLN: INTRAVENOUS | Status: DC

## 2021-12-04 MED ORDER — HYDRALAZINE HCL 20 MG/ML IJ SOLN
5.0000 mg | INTRAMUSCULAR | Status: DC | PRN
  Filled 2021-12-04: qty 1

## 2021-12-04 MED ORDER — SILVER SULFADIAZINE 1 % EX CREA
1.0000 | TOPICAL_CREAM | Freq: Every day | CUTANEOUS | Status: DC
  Administered 2021-12-05: 1 via TOPICAL
  Filled 2021-12-04: qty 85

## 2021-12-04 MED ORDER — TRAZODONE HCL 50 MG PO TABS: 25.0000 mg | ORAL_TABLET | Freq: Every evening | ORAL | Status: DC | PRN

## 2021-12-04 MED ORDER — POLYVINYL ALCOHOL 1.4 % OP SOLN: Freq: Every day | OPHTHALMIC | Status: DC | PRN

## 2021-12-04 MED ORDER — METHOCARBAMOL 500 MG PO TABS
500.0000 mg | ORAL_TABLET | Freq: Three times a day (TID) | ORAL | Status: DC | PRN
  Administered 2021-12-05 – 2021-12-06 (×2): 500 mg via ORAL
  Filled 2021-12-04 (×2): qty 1

## 2021-12-04 MED ORDER — ALBUTEROL SULFATE (2.5 MG/3ML) 0.083% IN NEBU: 3.0000 mL | INHALATION_SOLUTION | RESPIRATORY_TRACT | Status: DC | PRN

## 2021-12-04 MED ORDER — TRIAMTERENE-HCTZ 37.5-25 MG PO TABS
1.0000 | ORAL_TABLET | Freq: Every morning | ORAL | Status: DC
  Administered 2021-12-04 – 2021-12-07 (×4): 1 via ORAL
  Filled 2021-12-04 (×4): qty 1

## 2021-12-04 MED ORDER — INSULIN ASPART 100 UNIT/ML IJ SOLN
0.0000 [IU] | Freq: Every day | INTRAMUSCULAR | Status: DC
  Administered 2021-12-04 – 2021-12-06 (×3): 2 [IU] via SUBCUTANEOUS
  Filled 2021-12-04 (×3): qty 1

## 2021-12-04 MED ORDER — ONDANSETRON HCL 4 MG PO TABS: 4.0000 mg | ORAL_TABLET | Freq: Four times a day (QID) | ORAL | Status: DC | PRN

## 2021-12-04 MED ORDER — MAGNESIUM HYDROXIDE 400 MG/5ML PO SUSP: 30.0000 mL | Freq: Every day | ORAL | Status: DC | PRN

## 2021-12-04 MED ORDER — INSULIN ASPART 100 UNIT/ML IJ SOLN
0.0000 [IU] | Freq: Three times a day (TID) | INTRAMUSCULAR | Status: DC
  Administered 2021-12-04: 3 [IU] via SUBCUTANEOUS
  Administered 2021-12-05 – 2021-12-06 (×4): 2 [IU] via SUBCUTANEOUS
  Administered 2021-12-06 – 2021-12-07 (×3): 3 [IU] via SUBCUTANEOUS
  Administered 2021-12-07: 1 [IU] via SUBCUTANEOUS
  Filled 2021-12-04 (×9): qty 1

## 2021-12-04 MED ORDER — SODIUM CHLORIDE 0.9 % IV SOLN
1.0000 g | Freq: Once | INTRAVENOUS | Status: AC
  Administered 2021-12-04: 1 g via INTRAVENOUS
  Filled 2021-12-04: qty 10

## 2021-12-04 MED ORDER — ONDANSETRON HCL 4 MG/2ML IJ SOLN: 4.0000 mg | Freq: Four times a day (QID) | INTRAMUSCULAR | Status: DC | PRN

## 2021-12-04 MED ORDER — TAMSULOSIN HCL 0.4 MG PO CAPS
0.4000 mg | ORAL_CAPSULE | Freq: Every day | ORAL | Status: DC
  Administered 2021-12-04 – 2021-12-07 (×4): 0.4 mg via ORAL
  Filled 2021-12-04 (×4): qty 1

## 2021-12-04 MED ORDER — DOCUSATE SODIUM 100 MG PO CAPS
100.0000 mg | ORAL_CAPSULE | Freq: Two times a day (BID) | ORAL | Status: DC
  Administered 2021-12-04 – 2021-12-07 (×7): 100 mg via ORAL
  Filled 2021-12-04 (×7): qty 1

## 2021-12-04 MED ORDER — SODIUM CHLORIDE 0.9 % IV SOLN
1.0000 g | INTRAVENOUS | Status: DC
  Administered 2021-12-04 – 2021-12-05 (×2): 1 g via INTRAVENOUS
  Filled 2021-12-04: qty 1
  Filled 2021-12-04: qty 10

## 2021-12-04 MED ORDER — ACETAMINOPHEN 325 MG PO TABS
650.0000 mg | ORAL_TABLET | Freq: Once | ORAL | Status: AC
  Administered 2021-12-04: 650 mg via ORAL
  Filled 2021-12-04: qty 2

## 2021-12-04 MED ORDER — CYANOCOBALAMIN 500 MCG PO TABS
500.0000 ug | ORAL_TABLET | Freq: Every day | ORAL | Status: DC
  Administered 2021-12-04 – 2021-12-07 (×4): 500 ug via ORAL
  Filled 2021-12-04 (×4): qty 1

## 2021-12-04 MED ORDER — SENNOSIDES-DOCUSATE SODIUM 8.6-50 MG PO TABS: 1.0000 | ORAL_TABLET | Freq: Every evening | ORAL | Status: DC | PRN

## 2021-12-04 MED ORDER — FUROSEMIDE 20 MG PO TABS
20.0000 mg | ORAL_TABLET | Freq: Every day | ORAL | Status: DC
  Administered 2021-12-04 – 2021-12-07 (×4): 20 mg via ORAL
  Filled 2021-12-04 (×4): qty 1

## 2021-12-04 MED ORDER — ATORVASTATIN CALCIUM 20 MG PO TABS
80.0000 mg | ORAL_TABLET | Freq: Every day | ORAL | Status: DC
  Administered 2021-12-04 – 2021-12-07 (×4): 80 mg via ORAL
  Filled 2021-12-04 (×4): qty 4

## 2021-12-04 MED ORDER — MORPHINE SULFATE (PF) 2 MG/ML IV SOLN: 2.0000 mg | INTRAVENOUS | Status: DC | PRN

## 2021-12-04 MED ORDER — MORPHINE SULFATE (PF) 2 MG/ML IV SOLN
1.0000 mg | INTRAVENOUS | Status: DC | PRN
  Administered 2021-12-04 – 2021-12-06 (×4): 1 mg via INTRAVENOUS
  Filled 2021-12-04 (×6): qty 1

## 2021-12-04 MED ORDER — FINASTERIDE 5 MG PO TABS: 5.0000 mg | ORAL_TABLET | Freq: Every day | ORAL | Status: DC

## 2021-12-04 MED ORDER — LIDOCAINE 5 % EX PTCH
1.0000 | MEDICATED_PATCH | CUTANEOUS | Status: DC
  Administered 2021-12-04 – 2021-12-06 (×3): 1 via TRANSDERMAL
  Filled 2021-12-04 (×4): qty 1

## 2021-12-04 MED ORDER — DM-GUAIFENESIN ER 30-600 MG PO TB12: 1.0000 | ORAL_TABLET | Freq: Two times a day (BID) | ORAL | Status: DC | PRN

## 2021-12-04 MED ORDER — ACETAMINOPHEN 650 MG RE SUPP: 650.0000 mg | Freq: Four times a day (QID) | RECTAL | Status: DC | PRN

## 2021-12-04 NOTE — H&P (Signed)
History and Physical    RILAN EILAND EXN:[id number] DOB: 1930/05/29 DOA: 12/04/2021  Referring MD/NP/PA:   PCP: Venia Carbon, MD   Patient coming from:  The patient is coming from SNF  Chief Complaint: fall and right arm pain  HPI: Michael Colon is a 88 y.o. male with medical history significant of HTN, HLD, DM, stroke, asthma, s/p of PPM due to complete heart block, BPH, atrial fibrillation on Coumadin, recent left elbow injury without fracture (wearing sling), who presents with fall and right arm pain.   Per report pt had an unwitnessed fall in facility. No LOC.  Patient injured his head and right arm.  He complains of right upper arm pain, which is severe, constant, sharp, nonradiating.  He has hematoma in left forehead and bruise around the left eye.  Patient denies chest pain, cough, shortness breath.  Patient is constipated.  Denies nausea vomiting or abdominal pain.  He has increased urinary frequency, denies dysuria or burning on urination.  Data reviewed independently and ED Course: pt was found to have WBC 9.5, GFR> 60, positive urinalysis (cloudy appearance, large amount of leukocyte, positive nitrite, rare particular with WBC> 50), temperature normal, blood pressure 140/47, heart rate 66, RR 18, oxygen saturation 99% on room air.  CT of head and CT of C-spine is negative for acute injury.  CT of the maxillofacial showed subacute left nasal bone fractures with no acute fracture.  X-ray of right elbow is negative for fracture.  X-ray of R shoulder/humerus showed right proximal humerus fracture.  Patient is placed on TeleMed follow-up physician.  Dr. Sharlet Salina of Ortho is consulted.  EKG: I have personally reviewed.  Seem to be paced rhythm, QTc 474, bifascicular block, ST depression in lead I/aVL   Review of Systems:   General: no fevers, chills, no body weight gain, has fatigue HEENT: no blurry vision, hearing changes or sore throat Respiratory: no dyspnea, coughing,  wheezing CV: no chest pain, no palpitations GI: no nausea, vomiting, abdominal pain, diarrhea, constipation GU: no dysuria, burning on urination, increased urinary frequency, hematuria  Ext: no leg edema Neuro: no unilateral weakness, numbness, or tingling, no vision change or hearing loss.  Has fall Skin: Has bruise around left eye, has hematoma to left forehead MSK: Has tenderness in right upper arm.  Patient is wearing sling in the left arm. Heme: No easy bruising.  Travel history: No recent long distant travel.   Allergy:  Allergies  Allergen Reactions   Lovenox [Enoxaparin] Hives   Proscar [Finasteride] Other (See Comments)    Upper body and arm weakness   Latex Rash    Has trouble with BANDAIDS that have been left on for more than 24 hours. Prefers paper tape.   Nickel Rash    Localized rash   Percocet [Oxycodone-Acetaminophen] Rash   Tape Rash and Other (See Comments)    Sensitivity     Past Medical History:  Diagnosis Date   Asthma    Atrial flutter ([place]) 2007   Band keratopathy    Benign prostatic hypertrophy    Chronic ear infection    Chronic kidney disease    Chronic prostatitis    Dental bridge present    permanent - upper   Diabetes mellitus    Elbow stiffness, left    s/p fracture many yrs ago.  arm does not straighten   GERD (gastroesophageal reflux disease)    laryngeal involvement   Hyperlipidemia    Hypertension    Obstructive  sleep apnea    CPAP-9   Osteoarthrosis, localized, primary, knee    post-traumatic   Prostatitis    Stroke [facility])    TIA   Tachy-brady syndrome ([place])    UTI (urinary tract infection)     Past Surgical History:  Procedure Laterality Date   APPENDECTOMY     BELPHAROPTOSIS REPAIR     Dr [name]---didn't resolve weepy eye and eyelid drooping   CARDIOVERSION  04/13/2010   CATARACT EXTRACTION, BILATERAL  2009   Chest pain  8/12   Stress test benign   ESOPHAGEAL DILATION  05/26/2016   Procedure: ESOPHAGEAL DILATION;   Surgeon: Lucilla Lame, MD;  Location: [place];  Service: Endoscopy;;   ESOPHAGOGASTRODUODENOSCOPY (EGD) WITH PROPOFOL N/A 05/26/2016   Procedure: ESOPHAGOGASTRODUODENOSCOPY (EGD) WITH PROPOFOL;  Surgeon: Lucilla Lame, MD;  Location: [place];  Service: Endoscopy;  Laterality: N/A;  Diabetic - oral meds sleep apnea   EYE SURGERY     FRACTURE SURGERY Left    elbow   KNEE SURGERY  1998   plate after fracture, then removed for infection   left elbow surgery     MASTOIDECTOMY  8/08   Dr Idelle Crouch   PACEMAKER IMPLANT N/A 09/02/2021   Procedure: PACEMAKER IMPLANT;  Surgeon: Vickie Epley, MD;  Location: [place] [facility];  Service: Cardiovascular;  Laterality: N/A;   RHINOPLASTY  5/10   and septoplasty   SUBACROMIAL DECOMPRESSION Right 2005   Arthroscopic (for rotator cuff and biceps tendon ruptures)   TEAR DUCT PROBING  11/13   Dr Vickki Muff   TOTAL KNEE ARTHROPLASTY Left 09/01/2014   Procedure: TOTAL KNEE ARTHROPLASTY;  Surgeon: Dereck Leep, MD;  Location: [facility];  Service: Orthopedics;  Laterality: Left;   TRIGGER FINGER RELEASE Left 02/25/2015   Procedure: LEFT LONG TRIGGER RELEASE;  Surgeon: Dereck Leep, MD;  Location: [facility];  Service: Orthopedics;  Laterality: Left;   VENOUS ABLATION      Social History:  reports that he quit smoking about 52 years ago. His smoking use included cigarettes. He smoked an average of 1.00 packs per day. He has never been exposed to tobacco smoke. He has never used smokeless tobacco. He reports that he does not drink alcohol and does not use drugs.  Family History:  Family History  Problem Relation Age of Onset   Colon cancer Father    Diabetes Father    Hypertension Father    Other Mother        natural causes   Heart attack Neg Hx    Stroke Neg Hx      Prior to Admission medications   Medication Sig Start Date End Date Taking? Authorizing Provider  APPLE CIDER VINEGAR PO Take 1 capsule by mouth at bedtime.   Yes  [provider]  atorvastatin (LIPITOR) 80 MG tablet Take 1 tablet (80 mg total) by mouth daily. 08/24/20  Yes Venia Carbon, MD  cetirizine (ZYRTEC) 10 MG tablet Take 10 mg by mouth daily.   Yes [provider]  Cyanocobalamin (B-12 PO) Place 1 tablet under the tongue daily.   Yes [provider]  docusate sodium (COLACE) 100 MG capsule Take 100 mg by mouth 2 (two) times daily.   Yes [provider]  finasteride (PROSCAR) 5 MG tablet TAKE 1 TABLET BY MOUTH ONCE DAILY 09/22/20  Yes Venia Carbon, MD  ibuprofen (ADVIL,MOTRIN) 200 MG tablet Take 200 mg by mouth 2 (two) times daily.   Yes [provider]  loratadine (CLARITIN) 10 MG tablet Take 10 mg by mouth daily.   Yes [provider]  Magnesium Oxide 400 MG CAPS Take 1 capsule (400 mg total) by mouth daily. 10/20/21  Yes Baldwin Jamaica, PA-C  metFORMIN (GLUCOPHAGE) 500 MG tablet TAKE 1 TABLET BY MOUTH  TWICE DAILY WITH MEALS Patient taking differently: Take 500 mg by mouth 2 (two) times daily. 09/22/20  Yes Venia Carbon, MD  silver sulfADIAZINE (SILVADENE) 1 % cream Apply 1 Application topically daily. 11/08/21  Yes [name] I, MD  tamsulosin (FLOMAX) 0.4 MG CAPS capsule TAKE 1 CAPSULE BY MOUTH  DAILY 11/08/21  Yes Venia Carbon, MD  triamterene-hydrochlorothiazide (DYAZIDE) 37.5-25 MG capsule TAKE 1 CAPSULE BY MOUTH IN  THE MORNING 08/31/21  Yes Venia Carbon, MD  warfarin (COUMADIN) 5 MG tablet TAKE 1 TO 1 AND 1/2 TABLETS BY MOUTH DAILY AS DIRECTED  BY COUMADIN CLINIC Patient taking differently: Take 5-7.5 mg by mouth See admin instructions. 7.5 mg at bedtime on Sunday, Tuesday, Thursday 5 mg at bedtime on Monday, Wednesday, Friday, Saturday 01/05/21  Yes Gollan, Kathlene November, MD  furosemide (LASIX) 20 MG tablet Take 1 tablet (20 mg total) by mouth daily as needed (Extra pill as needed for weight 215 or higher). Once daily and extra pill as needed for weight 215 or  higher. Patient not taking: Reported on 09/01/2021 02/24/20 10/09/21  Venia Carbon, MD  Polyethyl Glycol-Propyl Glycol (SYSTANE ULTRA OP) Place 1 drop into both eyes daily as needed (dry eyes).    [provider]    Physical Exam: Vitals:   12/04/21 1600 12/04/21 1630 12/04/21 1700 12/04/21 1705  BP: (!) 139/43 (!) 163/48 (!) 150/48   Pulse: 60 61 60   Resp:      Temp:    98.2 F (36.8 C)  TempSrc:    Oral  SpO2: 100% 100% 100%    General: Not in acute distress HEENT:       Eyes: PERRL, EOMI, no scleral icterus.       ENT: No discharge from the ears and nose, no pharynx injection, no tonsillar enlargement.        Neck: No JVD, no bruit, no mass felt. Heme: No neck lymph node enlargement. Cardiac: S1/S2, RRR, No murmurs, No gallops or rubs. Respiratory: No rales, wheezing, rhonchi or rubs. GI: Soft, nondistended, nontender, no rebound pain, no organomegaly, BS present. GU: No hematuria Ext: No pitting leg edema bilaterally. 1+DP/PT pulse bilaterally. Musculoskeletal: has tenderness in right upper arm. wearing sling in the left arm. Skin:  Has bruise around left eye, has hematoma to left forehead Neuro: Alert, oriented X3, cranial nerves II-XII grossly intact, moves all extremities Psych: Patient is not psychotic, no suicidal or hemocidal ideation.  Labs on Admission: I have personally reviewed following labs and imaging studies  CBC: Recent Labs  Lab 12/04/21 0118 12/04/21 0500  WBC 12.3* 9.5  NEUTROABS 10.2*  --   HGB 12.0* 11.0*  HCT 34.8* 32.9*  MCV 92.3 93.7  PLT 174 174   Basic Metabolic Panel: Recent Labs  Lab 12/04/21 0118 12/04/21 0500  NA 137 136  K 3.7 3.6  CL 106 105  CO2 23 25  GLUCOSE 186* 164*  BUN 26* 25*  CREATININE 0.77 0.79  CALCIUM 10.5* 9.8   GFR: Estimated Creatinine Clearance: 71.2 mL/min (by C-G formula based on SCr of 0.79 mg/dL). Liver Function Tests: No results for input(s): "AST", "ALT", "ALKPHOS", "BILITOT", "PROT",  "  ALBUMIN" in the last 168 hours. No results for input(s): "LIPASE", "AMYLASE" in the last 168 hours. No results for input(s): "AMMONIA" in the last 168 hours. Coagulation Profile: No results for input(s): "INR", "PROTIME" in the last 168 hours. Cardiac Enzymes: No results for input(s): "CKTOTAL", "CKMB", "CKMBINDEX", "TROPONINI" in the last 168 hours. BNP (last 3 results) No results for input(s): "PROBNP" in the last 8760 hours. HbA1C: No results for input(s): "HGBA1C" in the last 72 hours. CBG: Recent Labs  Lab 12/04/21 1144  GLUCAP 221*   Lipid Profile: No results for input(s): "CHOL", "HDL", "LDLCALC", "TRIG", "CHOLHDL", "LDLDIRECT" in the last 72 hours. Thyroid Function Tests: No results for input(s): "TSH", "T4TOTAL", "FREET4", "T3FREE", "THYROIDAB" in the last 72 hours. Anemia Panel: No results for input(s): "VITAMINB12", "FOLATE", "FERRITIN", "TIBC", "IRON", "RETICCTPCT" in the last 72 hours. Urine analysis:    Component Value Date/Time   COLORURINE YELLOW (A) 12/03/2021 2253   APPEARANCEUR CLOUDY (A) 12/03/2021 2253   APPEARANCEUR Cloudy (A) 01/14/2016 1536   LABSPEC 1.014 12/03/2021 2253   LABSPEC 1.018 09/08/2013 1854   PHURINE 6.0 12/03/2021 2253   GLUCOSEU NEGATIVE 12/03/2021 2253   GLUCOSEU NEGATIVE 11/30/2020 1554   HGBUR NEGATIVE 12/03/2021 2253   BILIRUBINUR NEGATIVE 12/03/2021 2253   BILIRUBINUR Negative 01/14/2016 1536   BILIRUBINUR Negative 09/08/2013 1854   KETONESUR 5 (A) 12/03/2021 2253   PROTEINUR NEGATIVE 12/03/2021 2253   UROBILINOGEN 2.0 (A) 11/30/2020 1554   NITRITE POSITIVE (A) 12/03/2021 2253   LEUKOCYTESUR LARGE (A) 12/03/2021 2253   LEUKOCYTESUR 1+ 09/08/2013 1854   Sepsis Labs: '@LABRCNTIP'$ (procalcitonin:4,lacticidven:4) )No results found for this or any previous visit (from the past 240 hour(s)).   Radiological Exams on Admission: CT CERVICAL SPINE WO CONTRAST  Result Date: 12/03/2021 CLINICAL DATA:  Fall on coumadin EXAM: CT CERVICAL  SPINE WITHOUT CONTRAST TECHNIQUE: Multidetector CT imaging of the cervical spine was performed without intravenous contrast. Multiplanar CT image reconstructions were also generated. RADIATION DOSE REDUCTION: This exam was performed according to the departmental dose-optimization program which includes automated exposure control, adjustment of the mA and/or kV according to patient size and/or use of iterative reconstruction technique. COMPARISON:  11/10/2021 FINDINGS: Alignment: Normal Skull base and vertebrae: No acute fracture. No primary bone lesion or focal pathologic process. Soft tissues and spinal canal: No prevertebral fluid or swelling. No visible canal hematoma. Disc levels: Diffuse degenerative disc and facet disease. No visible disc herniation. Upper chest: No acute findings Other: None IMPRESSION: Diffuse degenerative disc and facet disease. No acute bony abnormality. Electronically Signed   By: Rolm Baptise M.D.   On: 12/03/2021 21:20   CT MAXILLOFACIAL WO CONTRAST  Result Date: 12/03/2021 CLINICAL DATA:  Fall, on blood thinners EXAM: CT MAXILLOFACIAL WITHOUT CONTRAST TECHNIQUE: Multidetector CT imaging of the maxillofacial structures was performed. Multiplanar CT image reconstructions were also generated. RADIATION DOSE REDUCTION: This exam was performed according to the departmental dose-optimization program which includes automated exposure control, adjustment of the mA and/or kV according to patient size and/or use of iterative reconstruction technique. COMPARISON:  11/10/2021 FINDINGS: Osseous: Left nasal bone fractures are again noted, unchanged since prior study. Nasal septum is deviated to the left, likely on a chronic basis, stable since prior study. No acute facial fracture. Orbits: Negative. No traumatic or inflammatory finding. Sinuses: Evidence of prior surgery.  No air-fluid levels. Soft tissues: Soft tissue swelling over the left forehead. Limited intracranial: See head CT report  IMPRESSION: Left nasal bone fractures are stable since prior study 11/10/2021. No acute  facial or orbital fracture. Electronically Signed   By: Rolm Baptise M.D.   On: 12/03/2021 21:18   CT Head Wo Contrast  Result Date: 12/03/2021 CLINICAL DATA:  Fall, on blood thinners EXAM: CT HEAD WITHOUT CONTRAST TECHNIQUE: Contiguous axial images were obtained from the base of the skull through the vertex without intravenous contrast. RADIATION DOSE REDUCTION: This exam was performed according to the departmental dose-optimization program which includes automated exposure control, adjustment of the mA and/or kV according to patient size and/or use of iterative reconstruction technique. COMPARISON:  11/10/2021 FINDINGS: Brain: There is atrophy and chronic small vessel disease changes. No acute intracranial abnormality. Specifically, no hemorrhage, hydrocephalus, mass lesion, acute infarction, or significant intracranial injury. Vascular: No hyperdense vessel or unexpected calcification. Skull: No acute calvarial abnormality. Sinuses/Orbits: No acute findings Other: Soft tissue swelling over the left forehead. IMPRESSION: Atrophy, chronic microvascular disease. No acute intracranial abnormality. Electronically Signed   By: Rolm Baptise M.D.   On: 12/03/2021 21:16   DG Humerus Right  Result Date: 12/03/2021 CLINICAL DATA:  Fall, right shoulder pain EXAM: RIGHT HUMERUS - 2+ VIEW COMPARISON:  Right shoulder series FINDINGS: Fracture seen in the proximal right humerus involving the greater tuberosity and likely the humeral neck. No subluxation or dislocation. No additional humeral abnormality. IMPRESSION: Proximal right humeral fracture involving the greater tuberosity likely humeral neck. Electronically Signed   By: Rolm Baptise M.D.   On: 12/03/2021 21:12   DG Shoulder Right  Result Date: 12/03/2021 CLINICAL DATA:  Fall, right shoulder pain EXAM: RIGHT SHOULDER - 2+ VIEW COMPARISON:  None Available. FINDINGS: Fracture  noted through the proximal right humerus in the region of the greater tuberosity. Probable humeral neck fracture. No subluxation or dislocation. Degenerative changes in the right AC joint. IMPRESSION: Proximal right humeral fracture involving the greater tuberosity and likely humeral neck. Electronically Signed   By: Rolm Baptise M.D.   On: 12/03/2021 21:11   DG Elbow 2 Views Right  Result Date: 12/03/2021 CLINICAL DATA:  Fall EXAM: RIGHT ELBOW - 2 VIEW COMPARISON:  None Available. FINDINGS: Degenerative changes in the right elbow with joint space narrowing and spurring. No acute bony abnormality. Specifically, no fracture, subluxation, or dislocation. No joint effusion. IMPRESSION: No acute bony abnormality. Electronically Signed   By: Rolm Baptise M.D.   On: 12/03/2021 21:10      Assessment/Plan Principal Problem:   Proximal humerus fracture_right Active Problems:   Fall   UTI (urinary tract infection)   Chronic diastolic heart failure (HCC)   Atrial fibrillation, chronic (HCC)   Diabetes mellitus without complication (HCC)   Essential hypertension   HLD (hyperlipidemia)   Stroke ([place])   BPH with obstruction/lower urinary tract symptoms   Assessment and Plan:  Proximal humerus fracture_right: X-ray of R shoulder/humerus showed right proximal humerus fracture. Consulted Dr. Sharlet Salina who recommended conservative treatment.  -will place tele bed for obs -Sling to right arm -As needed morphine, Robaxin, Tylenol -Lidoderm  Fall -Fall precaution -Needed PT OT in facility  UTI (urinary tract infection) -Rocephin -Follow-up urine culture  Chronic diastolic heart failure ([place]): 2D echo on 08/05/2021 showed a EF of 55 to 60% with grade 2 diastolic dysfunction.  Patient does not have leg edema, but has mildly elevated BNP 228.  Patient is a taking Lasix as needed at home. -Started Lasix 20 mg daily orally  Atrial fibrillation, chronic ([place]): Heart rate 66 -Hold Coumadin due to  hematoma  Diabetes mellitus without complication [facility]): Recent A1c 7.6, poorly  controlled.  Patient taking metformin at home. -SSI  Essential hypertension -IV hydralazine as needed -On Lasix 20 mg daily, orally -Dyazide  HLD (hyperlipidemia) -Lipitor  Stroke ([place]) -Lipitor  BPH with obstruction/lower urinary tract symptoms -Proscar and Flomax   DVT ppx: SCD  Code Status: Partial code (I discussed with the patient in the presence of his son, and explained the meaning of CODE STATUS, patient wants to be partial code, OK for CPR, but no intubation).  Family Communication:  Yes, patient's son   at bed side.    Disposition Plan:  Anticipate discharge back to previous environment,SNF  Consults called:  Dr. Sharlet Salina of ortho  Admission status and Level of care: Telemetry Medical:   as inpt   Dispo: The patient is from: SNF              Anticipated d/c is to: SNF              Anticipated d/c date is: 2 days              Patient currently is not medically stable to d/c.    Severity of Illness:  The appropriate patient status for this patient is INPATIENT. Inpatient status is judged to be reasonable and necessary in order to provide the required intensity of service to ensure the patient's safety. The patient's presenting symptoms, physical exam findings, and initial radiographic and laboratory data in the context of their chronic comorbidities is felt to place them at high risk for further clinical deterioration. Furthermore, it is not anticipated that the patient will be medically stable for discharge from the hospital within 2 midnights of admission.   * I certify that at the point of admission it is my clinical judgment that the patient will require inpatient hospital care spanning beyond 2 midnights from the point of admission due to high intensity of service, high risk for further deterioration and high frequency of surveillance required.*           Date of Service  12/04/2021    Ivor Costa [facility]   If 7PM-7AM, please contact night-coverage [email or web address] 12/04/2021, 5:36 PM

## 2021-12-04 NOTE — ED Provider Notes (Signed)
[facility] Provider Note    Event Date/Time   First MD Initiated Contact with Patient 12/04/21 [phone]     (approximate)   History   Fall  On Thinners   HPI  Michael Colon is a 88 y.o. male with history of paroxysmal atrial fibrillation on Coumadin, diabetes, hypertension, and TIA who presents after a mechanical fall.  The patient states that he has his left arm in a sling and was going to answer the door when he tripped and fell.  He did not feel dizzy or lightheaded.  He did hit his head but denies losing consciousness.  He reports a headache as well as pain to the right shoulder.  He denies other injuries.  I reviewed the past medical records.  The patient most recently admitted in late August.  Per the hospitalist discharge summary from 9/1 he presented at that time with complete heart block for pacemaker implantation.  He was also seen in the ED on 11/8 after a fall and had a left elbow injury which was subsequent diagnosed as a fracture.  He is in a partial cast and sling.   Physical Exam   Triage Vital Signs: ED Triage Vitals  Enc Vitals Group     BP 12/03/21 1923 (!) 122/49     Pulse Rate 12/03/21 1923 61     Resp 12/03/21 1923 18     Temp 12/03/21 1923 97.6 F (36.4 C)     Temp Source 12/03/21 2254 Oral     SpO2 12/03/21 1923 100 %     Weight --      Height --      Head Circumference --      Peak Flow --      Pain Score 12/03/21 1921 10     Pain Loc --      Pain Edu? --      Excl. in [place]? --     Most recent vital signs: Vitals:   12/03/21 2254 12/04/21 0101  BP: (!) 135/52 (!) 149/57  Pulse: 60 64  Resp: 20 17  Temp: 98.2 F (36.8 C) 97.9 F (36.6 C)  SpO2: 98% 99%    General: Alert and oriented, no distress.  CV:  Good peripheral perfusion.  Resp:  Normal effort.  Abd:  No distention.  Other:  EOMI.  PERRLA.  Left facial and periorbital bruising.  No midline cervical spinal tenderness.  Motor intact in all extremities.   Normal coordination.  Pain on range of motion of right shoulder.  2+ radial pulse.  Normal cap refill distally.  Motor and sensory intact and in the right upper extremity.   ED Results / Procedures / Treatments   Labs (all labs ordered are listed, but only abnormal results are displayed) Labs Reviewed  URINALYSIS, ROUTINE W REFLEX MICROSCOPIC - Abnormal; Notable for the following components:      Result Value   Color, Urine YELLOW (*)    APPearance CLOUDY (*)    Ketones, ur 5 (*)    Nitrite POSITIVE (*)    Leukocytes,Ua LARGE (*)    WBC, UA >50 (*)    Bacteria, UA RARE (*)    All other components within normal limits  BASIC METABOLIC PANEL - Abnormal; Notable for the following components:   Glucose, Bld 186 (*)    BUN 26 (*)    Calcium 10.5 (*)    All other components within normal limits  CBC WITH DIFFERENTIAL/PLATELET - Abnormal; Notable  for the following components:   WBC 12.3 (*)    RBC 3.77 (*)    Hemoglobin 12.0 (*)    HCT 34.8 (*)    Neutro Abs 10.2 (*)    Monocytes Absolute 1.2 (*)    All other components within normal limits     EKG     RADIOLOGY  XR R shoulder/humerus: I independently viewed and interpreted the images; there is a proximal humerus fracture  XR R elbow: No acute fracture  CT head: No ICH or other acute abnormality  CT maxillofacial: Subacute left nasal bone fractures with no acute fracture  CT cervical spine: No acute fracture   PROCEDURES:  Critical Care performed: No  Procedures   MEDICATIONS ORDERED IN ED: Medications  acetaminophen (TYLENOL) tablet 650 mg (650 mg Oral Given 12/04/21 0103)  cefTRIAXone (ROCEPHIN) 1 g in sodium chloride 0.9 % 100 mL IVPB (1 g Intravenous New Bag/Given 12/04/21 0120)     IMPRESSION / MDM / ASSESSMENT AND PLAN / ED COURSE  I reviewed the triage vital signs and the nursing notes.  88 year old male with PMH as noted above presents after mechanical fall from standing height with head injury and  right shoulder injury.  Neurologic exam is normal and the patient's vital signs are normal as well.  He does report some urinary discomfort and frequency as well.  Imaging was obtained from triage and shows a proximal humerus fracture.  CT head, maxillofacial, and cervical spine are negative for acute findings.  Urine also shows findings consistent with UTI.  Differential diagnosis includes, but is not limited to, UTI, proximal humerus fracture, minor head injury, concussion.  I have ordered basic labs and given Tylenol for pain control as well as ceftriaxone for the UTI.  The patient already has a left elbow fracture that is in a splint and sling.  Given that he now has an injury to the right humerus that will require immobilization, I will plan for admission for orthopedic evaluation and PT given that he cannot complete his ADLs.  Patient's presentation is most consistent with acute presentation with potential threat to life or bodily function.  ----------------------------------------- 2:14 AM on 12/04/2021 -----------------------------------------  Lab work-up reveals mild leukocytosis and normal electrolytes.  Creatinine is normal.  I consulted Dr. Sidney Ace from the hospitalist service; based on our discussion he agrees to admit the patient.    FINAL CLINICAL IMPRESSION(S) / ED DIAGNOSES   Final diagnoses:  Closed fracture of head of right humerus, initial encounter  Urinary tract infection without hematuria, site unspecified     Rx / DC Orders   ED Discharge Orders     None        Note:  This document was prepared using Dragon voice recognition software and may include unintentional dictation errors.    Arta Silence, MD 12/04/21 [phone]

## 2021-12-04 NOTE — Consult Note (Signed)
ORTHOPAEDIC CONSULTATION  REQUESTING PHYSICIAN: Ivor Costa, MD  Chief Complaint: Right shoulder pain  HPI: Michael Colon is a 88 y.o. male who complains of right shoulder pain after mechanical fall. The pain is sharp in character.  The pain is worse with movement and better with rest. Denies any numbness, tingling or constitutional symptoms.  Patient is reportedly being followed by Jefm Bryant for a fracture of the left elbow which occurred in early November.  X-rays from 11/8 show evidence of advanced left elbow arthritis.  X-rays in the ER showed the presence of a minimally displaced proximal humerus fracture through the greater tuberosity as well as possibly surgical neck.  Orthopedics was consulted regarding further management.  Past medical history notable for paroxysmal atrial fibrillation on Coumadin, diabetes, hypertension, and TIA.   Past Medical History:  Diagnosis Date   Asthma    Atrial flutter ([place]) 2007   Band keratopathy    Benign prostatic hypertrophy    Chronic ear infection    Chronic kidney disease    Chronic prostatitis    Dental bridge present    permanent - upper   Diabetes mellitus    Elbow stiffness, left    s/p fracture many yrs ago.  arm does not straighten   GERD (gastroesophageal reflux disease)    laryngeal involvement   Hyperlipidemia    Hypertension    Obstructive sleep apnea    CPAP-9   Osteoarthrosis, localized, primary, knee    post-traumatic   Prostatitis    Stroke ([place])    TIA   Tachy-brady syndrome ([place])    UTI (urinary tract infection)    Past Surgical History:  Procedure Laterality Date   APPENDECTOMY     BELPHAROPTOSIS REPAIR     Dr [name]---didn't resolve weepy eye and eyelid drooping   CARDIOVERSION  04/13/2010   CATARACT EXTRACTION, BILATERAL  2009   Chest pain  8/12   Stress test benign   ESOPHAGEAL DILATION  05/26/2016   Procedure: ESOPHAGEAL DILATION;  Surgeon: Lucilla Lame, MD;  Location: [place];  Service:  Endoscopy;;   ESOPHAGOGASTRODUODENOSCOPY (EGD) WITH PROPOFOL N/A 05/26/2016   Procedure: ESOPHAGOGASTRODUODENOSCOPY (EGD) WITH PROPOFOL;  Surgeon: Lucilla Lame, MD;  Location: [place];  Service: Endoscopy;  Laterality: N/A;  Diabetic - oral meds sleep apnea   EYE SURGERY     FRACTURE SURGERY Left    elbow   KNEE SURGERY  1998   plate after fracture, then removed for infection   left elbow surgery     MASTOIDECTOMY  8/08   Dr Idelle Crouch   PACEMAKER IMPLANT N/A 09/02/2021   Procedure: PACEMAKER IMPLANT;  Surgeon: Vickie Epley, MD;  Location: [place] [facility];  Service: Cardiovascular;  Laterality: N/A;   RHINOPLASTY  5/10   and septoplasty   SUBACROMIAL DECOMPRESSION Right 2005   Arthroscopic (for rotator cuff and biceps tendon ruptures)   TEAR DUCT PROBING  11/13   Dr Vickki Muff   TOTAL KNEE ARTHROPLASTY Left 09/01/2014   Procedure: TOTAL KNEE ARTHROPLASTY;  Surgeon: Dereck Leep, MD;  Location: [facility];  Service: Orthopedics;  Laterality: Left;   TRIGGER FINGER RELEASE Left 02/25/2015   Procedure: LEFT LONG TRIGGER RELEASE;  Surgeon: Dereck Leep, MD;  Location: [facility];  Service: Orthopedics;  Laterality: Left;   VENOUS ABLATION     Social History   Socioeconomic History   Marital status: Widowed    Spouse name: Not on file   Number of children: 2   Years  of education: Not on file   Highest education level: Not on file  Occupational History   Occupation: Pastor    Comment: Retired--[employer]  Tobacco Use   Smoking status: Former    Packs/day: 1.00    Years: 0.00    Total pack years: 0.00    Types: Cigarettes    Quit date: 01/03/1969    Years since quitting: 52.9    Passive exposure: Never   Smokeless tobacco: Never  Vaping Use   Vaping Use: Never used  Substance and Sexual Activity   Alcohol use: No    Alcohol/week: 1.0 standard drink of alcohol    Types: 1 Glasses of wine per week    Comment: rare wine. 1-2x/yr.   Drug use: No   Sexual activity:  Not on file  Other Topics Concern   Not on file  Social History Narrative   Has living will   Health care POA-- son Shanon Brow   Has DNR order from the past---form redone 06/25/10   Probably no feeding tube if cognitively unaware   Social Determinants of Health   Financial Resource Strain: Not on file  Food Insecurity: Not on file  Transportation Needs: Not on file  Physical Activity: Not on file  Stress: Not on file  Social Connections: Not on file   Family History  Problem Relation Age of Onset   Colon cancer Father    Diabetes Father    Hypertension Father    Other Mother        natural causes   Heart attack Neg Hx    Stroke Neg Hx    Allergies  Allergen Reactions   Lovenox [Enoxaparin] Hives   Proscar [Finasteride] Other (See Comments)    Upper body and arm weakness   Latex Rash    Has trouble with BANDAIDS that have been left on for more than 24 hours. Prefers paper tape.   Nickel Rash    Localized rash   Percocet [Oxycodone-Acetaminophen] Rash   Tape Rash and Other (See Comments)    Sensitivity    Prior to Admission medications   Medication Sig Start Date End Date Taking? Authorizing Provider  APPLE CIDER VINEGAR PO Take 1 capsule by mouth at bedtime.   Yes [provider]  atorvastatin (LIPITOR) 80 MG tablet Take 1 tablet (80 mg total) by mouth daily. 08/24/20  Yes Venia Carbon, MD  cetirizine (ZYRTEC) 10 MG tablet Take 10 mg by mouth daily.   Yes [provider]  Cyanocobalamin (B-12 PO) Place 1 tablet under the tongue daily.   Yes [provider]  docusate sodium (COLACE) 100 MG capsule Take 100 mg by mouth 2 (two) times daily.   Yes [provider]  finasteride (PROSCAR) 5 MG tablet TAKE 1 TABLET BY MOUTH ONCE DAILY 09/22/20  Yes Venia Carbon, MD  ibuprofen (ADVIL,MOTRIN) 200 MG tablet Take 200 mg by mouth 2 (two) times daily.   Yes [provider]  loratadine (CLARITIN) 10 MG tablet Take 10 mg by mouth daily.    Yes [provider]  Magnesium Oxide 400 MG CAPS Take 1 capsule (400 mg total) by mouth daily. 10/20/21  Yes Baldwin Jamaica, PA-C  metFORMIN (GLUCOPHAGE) 500 MG tablet TAKE 1 TABLET BY MOUTH  TWICE DAILY WITH MEALS Patient taking differently: Take 500 mg by mouth 2 (two) times daily. 09/22/20  Yes Venia Carbon, MD  silver sulfADIAZINE (SILVADENE) 1 % cream Apply 1 Application topically daily. 11/08/21  Yes Letvak,  Richard I, MD  tamsulosin (FLOMAX) 0.4 MG CAPS capsule TAKE 1 CAPSULE BY MOUTH  DAILY 11/08/21  Yes Venia Carbon, MD  triamterene-hydrochlorothiazide (DYAZIDE) 37.5-25 MG capsule TAKE 1 CAPSULE BY MOUTH IN  THE MORNING 08/31/21  Yes Venia Carbon, MD  warfarin (COUMADIN) 5 MG tablet TAKE 1 TO 1 AND 1/2 TABLETS BY MOUTH DAILY AS DIRECTED  BY COUMADIN CLINIC Patient taking differently: Take 5-7.5 mg by mouth See admin instructions. 7.5 mg at bedtime on Sunday, Tuesday, Thursday 5 mg at bedtime on Monday, Wednesday, Friday, Saturday 01/05/21  Yes Gollan, Kathlene November, MD  furosemide (LASIX) 20 MG tablet Take 1 tablet (20 mg total) by mouth daily as needed (Extra pill as needed for weight 215 or higher). Once daily and extra pill as needed for weight 215 or higher. Patient not taking: Reported on 09/01/2021 02/24/20 10/09/21  Venia Carbon, MD  Polyethyl Glycol-Propyl Glycol (SYSTANE ULTRA OP) Place 1 drop into both eyes daily as needed (dry eyes).    [provider]   CT CERVICAL SPINE WO CONTRAST  Result Date: 12/03/2021 CLINICAL DATA:  Fall on coumadin EXAM: CT CERVICAL SPINE WITHOUT CONTRAST TECHNIQUE: Multidetector CT imaging of the cervical spine was performed without intravenous contrast. Multiplanar CT image reconstructions were also generated. RADIATION DOSE REDUCTION: This exam was performed according to the departmental dose-optimization program which includes automated exposure control, adjustment of the mA and/or kV according to patient size and/or use  of iterative reconstruction technique. COMPARISON:  11/10/2021 FINDINGS: Alignment: Normal Skull base and vertebrae: No acute fracture. No primary bone lesion or focal pathologic process. Soft tissues and spinal canal: No prevertebral fluid or swelling. No visible canal hematoma. Disc levels: Diffuse degenerative disc and facet disease. No visible disc herniation. Upper chest: No acute findings Other: None IMPRESSION: Diffuse degenerative disc and facet disease. No acute bony abnormality. Electronically Signed   By: Rolm Baptise M.D.   On: 12/03/2021 21:20   CT MAXILLOFACIAL WO CONTRAST  Result Date: 12/03/2021 CLINICAL DATA:  Fall, on blood thinners EXAM: CT MAXILLOFACIAL WITHOUT CONTRAST TECHNIQUE: Multidetector CT imaging of the maxillofacial structures was performed. Multiplanar CT image reconstructions were also generated. RADIATION DOSE REDUCTION: This exam was performed according to the departmental dose-optimization program which includes automated exposure control, adjustment of the mA and/or kV according to patient size and/or use of iterative reconstruction technique. COMPARISON:  11/10/2021 FINDINGS: Osseous: Left nasal bone fractures are again noted, unchanged since prior study. Nasal septum is deviated to the left, likely on a chronic basis, stable since prior study. No acute facial fracture. Orbits: Negative. No traumatic or inflammatory finding. Sinuses: Evidence of prior surgery.  No air-fluid levels. Soft tissues: Soft tissue swelling over the left forehead. Limited intracranial: See head CT report IMPRESSION: Left nasal bone fractures are stable since prior study 11/10/2021. No acute facial or orbital fracture. Electronically Signed   By: Rolm Baptise M.D.   On: 12/03/2021 21:18   CT Head Wo Contrast  Result Date: 12/03/2021 CLINICAL DATA:  Fall, on blood thinners EXAM: CT HEAD WITHOUT CONTRAST TECHNIQUE: Contiguous axial images were obtained from the base of the skull through the vertex  without intravenous contrast. RADIATION DOSE REDUCTION: This exam was performed according to the departmental dose-optimization program which includes automated exposure control, adjustment of the mA and/or kV according to patient size and/or use of iterative reconstruction technique. COMPARISON:  11/10/2021 FINDINGS: Brain: There is atrophy and chronic small vessel disease changes. No acute intracranial abnormality. Specifically,  no hemorrhage, hydrocephalus, mass lesion, acute infarction, or significant intracranial injury. Vascular: No hyperdense vessel or unexpected calcification. Skull: No acute calvarial abnormality. Sinuses/Orbits: No acute findings Other: Soft tissue swelling over the left forehead. IMPRESSION: Atrophy, chronic microvascular disease. No acute intracranial abnormality. Electronically Signed   By: Rolm Baptise M.D.   On: 12/03/2021 21:16   DG Humerus Right  Result Date: 12/03/2021 CLINICAL DATA:  Fall, right shoulder pain EXAM: RIGHT HUMERUS - 2+ VIEW COMPARISON:  Right shoulder series FINDINGS: Fracture seen in the proximal right humerus involving the greater tuberosity and likely the humeral neck. No subluxation or dislocation. No additional humeral abnormality. IMPRESSION: Proximal right humeral fracture involving the greater tuberosity likely humeral neck. Electronically Signed   By: Rolm Baptise M.D.   On: 12/03/2021 21:12   DG Shoulder Right  Result Date: 12/03/2021 CLINICAL DATA:  Fall, right shoulder pain EXAM: RIGHT SHOULDER - 2+ VIEW COMPARISON:  None Available. FINDINGS: Fracture noted through the proximal right humerus in the region of the greater tuberosity. Probable humeral neck fracture. No subluxation or dislocation. Degenerative changes in the right AC joint. IMPRESSION: Proximal right humeral fracture involving the greater tuberosity and likely humeral neck. Electronically Signed   By: Rolm Baptise M.D.   On: 12/03/2021 21:11   DG Elbow 2 Views Right  Result Date:  12/03/2021 CLINICAL DATA:  Fall EXAM: RIGHT ELBOW - 2 VIEW COMPARISON:  None Available. FINDINGS: Degenerative changes in the right elbow with joint space narrowing and spurring. No acute bony abnormality. Specifically, no fracture, subluxation, or dislocation. No joint effusion. IMPRESSION: No acute bony abnormality. Electronically Signed   By: Rolm Baptise M.D.   On: 12/03/2021 21:10    Positive ROS: All other systems have been reviewed and were otherwise negative with the exception of those mentioned in the HPI and as above.  Physical Exam: General: Alert, no acute distress Cardiovascular: No pedal edema Respiratory: No cyanosis, no use of accessory musculature GI: No organomegaly, abdomen is soft and non-tender Skin: No lesions in the area of chief complaint Neurologic: Sensation intact distally Psychiatric: Patient is competent for consent with normal mood and affect Lymphatic: No axillary or cervical lymphadenopathy  MUSCULOSKELETAL: Tenderness to palpation about the right shoulder, no tenderness about the elbow wrist or hand, range of motion of shoulder deferred, tolerates appropriate passive motion of elbow wrist and hand.  Compartments soft. Good cap refill. Motor and sensory intact distally.  Assessment: 88 year old male with multiple medical comorbidities admitted with a minimally displaced right proximal humerus fracture  Plan: We discussed the nature of his fracture pattern and have recommended use of a sling for approximately 4 to 6 weeks.  Patient is okay to start elbow wrist and hand motion at this time as well as gentle shoulder motion in a few weeks pending improvement in his pain.  Recommend follow-up in approximately 4 weeks with repeat x-rays as an outpatient.    Renee Harder, MD    12/04/2021 10:58 AM

## 2021-12-04 NOTE — ED Notes (Signed)
Meal tray at bedside.  

## 2021-12-04 NOTE — ED Notes (Signed)
Pt up to bedside commode with one assist, had BM. Placed sling to right shoulder per Ortho note. Pt tolerated well. Pt with no needs.

## 2021-12-05 ENCOUNTER — Encounter: Payer: Self-pay | Admitting: Internal Medicine

## 2021-12-05 ENCOUNTER — Other Ambulatory Visit: Payer: Self-pay

## 2021-12-05 DIAGNOSIS — S42209A Unspecified fracture of upper end of unspecified humerus, initial encounter for closed fracture: Secondary | ICD-10-CM | POA: Diagnosis not present

## 2021-12-05 LAB — PROTIME-INR
INR: 3.9 — ABNORMAL HIGH (ref 0.8–1.2)
Prothrombin Time: 38 seconds — ABNORMAL HIGH (ref 11.4–15.2)

## 2021-12-05 LAB — GLUCOSE, CAPILLARY
Glucose-Capillary: 156 mg/dL — ABNORMAL HIGH (ref 70–99)
Glucose-Capillary: 192 mg/dL — ABNORMAL HIGH (ref 70–99)
Glucose-Capillary: 195 mg/dL — ABNORMAL HIGH (ref 70–99)
Glucose-Capillary: 203 mg/dL — ABNORMAL HIGH (ref 70–99)

## 2021-12-05 MED ORDER — SENNOSIDES-DOCUSATE SODIUM 8.6-50 MG PO TABS
1.0000 | ORAL_TABLET | Freq: Two times a day (BID) | ORAL | Status: DC
  Filled 2021-12-05: qty 1

## 2021-12-05 MED ORDER — TRAMADOL HCL 50 MG PO TABS
50.0000 mg | ORAL_TABLET | Freq: Four times a day (QID) | ORAL | Status: DC | PRN
  Administered 2021-12-07: 50 mg via ORAL
  Filled 2021-12-05: qty 1

## 2021-12-05 MED ORDER — POLYETHYLENE GLYCOL 3350 17 G PO PACK
17.0000 g | PACK | Freq: Every day | ORAL | Status: DC
  Filled 2021-12-05: qty 1

## 2021-12-05 MED ORDER — BISACODYL 5 MG PO TBEC
5.0000 mg | DELAYED_RELEASE_TABLET | Freq: Two times a day (BID) | ORAL | Status: DC
  Administered 2021-12-05 – 2021-12-07 (×4): 5 mg via ORAL
  Filled 2021-12-05 (×4): qty 1

## 2021-12-05 NOTE — Consult Note (Signed)
[name] for Warfarin Indication: atrial fibrillation  Allergies  Allergen Reactions   Lovenox [Enoxaparin] Hives   Proscar [Finasteride] Other (See Comments)    Upper body and arm weakness   Latex Rash    Has trouble with BANDAIDS that have been left on for more than 24 hours. Prefers paper tape.   Nickel Rash    Localized rash   Percocet [Oxycodone-Acetaminophen] Rash   Tape Rash and Other (See Comments)    Sensitivity     Patient Measurements: Height: '5\' 10"'$  (177.8 cm) IBW/kg (Calculated) : 73 Heparin Dosing Weight: 91.4 kg  Vital Signs: Temp: 97.8 F (36.6 C) (12/03 1013) Temp Source: Oral (12/03 0502) BP: 150/63 (12/03 1013) Pulse Rate: 59 (12/03 1013)  Labs: Recent Labs    12/04/21 0118 12/04/21 0500 12/05/21 1118  HGB 12.0* 11.0*  --   HCT 34.8* 32.9*  --   PLT 174 157  --   LABPROT  --   --  38.0*  INR  --   --  3.9*  CREATININE 0.77 0.79  --     Estimated Creatinine Clearance: 71.2 mL/min (by C-G formula based on SCr of 0.79 mg/dL).   Medical History: Past Medical History:  Diagnosis Date   Asthma    Atrial flutter ([place]) 2007   Band keratopathy    Benign prostatic hypertrophy    Chronic ear infection    Chronic kidney disease    Chronic prostatitis    Dental bridge present    permanent - upper   Diabetes mellitus    Elbow stiffness, left    s/p fracture many yrs ago.  arm does not straighten   GERD (gastroesophageal reflux disease)    laryngeal involvement   Hyperlipidemia    Hypertension    Obstructive sleep apnea    CPAP-9   Osteoarthrosis, localized, primary, knee    post-traumatic   Prostatitis    Stroke ([place])    TIA   Tachy-brady syndrome ([place])    UTI (urinary tract infection)     Medications:  PTA: warfarin '5mg'$  MWFSa, 7.'5mg'$  WuTuTh Last dose: patient unsure but knows it was sometime before 12/02/21  Assessment: Pharmacy has been consulted to reinitiate warfarin in 88yo male with  history of atrial fibrillation who presented to the ED on 12/02 after an unwitnessed fall. Patient injured his head and right arm, no loss of consciousness, however. X-ray of right shoulder/humerus showed right proximal humerus fracture. Orthopedics consulted, recommended conservative management and cleared to resume warfarin  Date INR Dose 12/3 3.9 hold  Goal of Therapy:  INR 2-3 Monitor platelets by anticoagulation protocol: Yes   Plan:  INR currently supratherapeutic Will hold dose for today INR/CBC daily  Michael Colon 12/05/2021,2:44 PM

## 2021-12-05 NOTE — Progress Notes (Addendum)
PROGRESS NOTE  Michael Colon  IDP:[id number] DOB: 1930/11/24 DOA: 12/04/2021 PCP: Venia Carbon, MD   Brief Narrative: Patient is a 88 year old male with history of hypertension, hyperlipidemia, diabetes type 2, stroke, asthma, status post pacemaker placement due to complete heart block, BPH, A-fib on Coumadin, recent left ankle injury who presented with fall and right arm pain from retirement  [employer].  No history of loss of consciousness.  Complained of right upper arm pain which was severe.  Found to have hematoma in the left forehead and bruise around the left eye.  On presentation, UA showed large amount of leukocytes, positive nitrates.  He was hemodynamically stable.  CT head/C-spine negative for acute findings.  CT of the maxillofacial showed subacute left nasal bone fractures but no acute fracture.  X-ray of the right lower showed right proximal humerus fracture.  Orthopedics consulted.  Plan for conservative management.  PT/OT consulted  Assessment & Plan:  Principal Problem:   Proximal humerus fracture_right Active Problems:   Fall   UTI (urinary tract infection)   Chronic diastolic heart failure (HCC)   Atrial fibrillation, chronic (HCC)   Diabetes mellitus without complication (HCC)   Essential hypertension   HLD (hyperlipidemia)   Stroke ([place])   BPH with obstruction/lower urinary tract symptoms  Right proximal humerus fracture/fall: X-ray of right shoulder/humerus showed right proximal humerus fracture.  Orthopedics ,Dr Randalyn Rhea.  Recommended conservative management, use of sling for 4 to 6 weeks.  Recommended outpatient follow-up in 4 weeks with repeat chest x-rays as outpatient.  PT/OT consulted.  Continue pain management, supportive care. Patient recently fractured his left elbow and currently on a cast. Patient is currently residing at retirement community, [place].  Most likely he will need skilled nursing on discharge.  TOC consulted.  UTI:  Unclear if he had urinary symptoms but UA showed leukocytes, positive nitrates.  He also had mild leukocytosis on presentation which has resolved.  Currently on ceftriaxone.  Follow-up urine culture.  Chronic diastolic CHF: 2D echo on 04/06/3152 showed EF of 55 to 00%, grade 2 diastolic dysfunction.  No leg edema.  Mild elevated BNP.  On Lasix as needed at home.  On Lasix 20 mg daily right now  Chronic A-fib: Currently rate is controlled.EKG showed junctional rhythm.  Takes Coumadin which is on hold due to hematoma.  We will restart  Diabetes type 2: Recent A1c of 7.6.  Takes metformin.  Continue sliding scale insulin for now  Hypertension: On  Dyazide.  Monitor blood pressure.  Continue.  Medications for severe hypertension  Hyperlipidemia: On Lipitor  Stroke: Takes Lipitor.  Ambulates with help of walker  BPH: On Proscar and Flomax.        DVT prophylaxis:SCDs Start: 12/04/21 8676     Code Status: Partial Code  Family Communication: Discussed with son on phone on 12/3  Patient status:Inpatient  Patient is from: [employer] retirement community  Anticipated discharge to:SNF  Estimated DC date: 1 to 2 days   Consultants: Orthopedics  Procedures:None  Antimicrobials:  Anti-infectives (From admission, onward)    Start     Dose/Rate Route Frequency Ordered Stop   12/04/21 2200  cefTRIAXone (ROCEPHIN) 1 g in sodium chloride 0.9 % 100 mL IVPB        1 g 200 mL/hr over 30 Minutes Intravenous Every 24 hours 12/04/21 0224     12/04/21 0100  cefTRIAXone (ROCEPHIN) 1 g in sodium chloride 0.9 % 100 mL IVPB  1 g 200 mL/hr over 30 Minutes Intravenous  Once 12/04/21 0049 12/04/21 0238       Subjective:  Patient seen and examined at bedside today.  Hemodynamically stable lying in bed.  Very frustrated because he is hungry and nobody has helped him to eat.  He has a cast on the left upper extremity in a sling on the right upper extremity and somebody needs to help him for  feeding.  Currently the pain is well controlled.  No new complaints  Objective: Vitals:   12/04/21 1705 12/04/21 1816 12/04/21 2023 12/05/21 0502  BP:  (!) 161/67 (!) 158/58 (!) 157/57  Pulse:  60 (!) 59 60  Resp:  '16 18 18  '$ Temp: 01.7 F (36.8 C) 98.5 F (36.9 C) 98.3 F (36.8 C) 97.9 F (36.6 C)  TempSrc: Oral  Oral Oral  SpO2:  99% 96% 98%  Height:   '5\' 10"'$  (1.778 m)     Intake/Output Summary (Last 24 hours) at 12/05/2021 7939 Last data filed at 12/05/2021 0300 Gross per 24 hour  Intake --  Output 1150 ml  Net -1150 ml   There were no vitals filed for this visit.  Examination:  General exam: Overall comfortable, not in distress, elderly gentleman HEENT: PERRL, facial bruises Respiratory system:  no wheezes or crackles  Cardiovascular system: S1 & S2 heard, RRR.  Gastrointestinal system: Abdomen is nondistended, soft and nontender. Central nervous system: Alert and oriented Extremities: No edema, no clubbing ,no cyanosis, cast on the left upper extremity, sling on the right upper extremity Skin: No rashes, no ulcers,no icterus     Data Reviewed: I have personally reviewed following labs and imaging studies  CBC: Recent Labs  Lab 12/04/21 0118 12/04/21 0500  WBC 12.3* 9.5  NEUTROABS 10.2*  --   HGB 12.0* 11.0*  HCT 34.8* 32.9*  MCV 92.3 93.7  PLT 174 923   Basic Metabolic Panel: Recent Labs  Lab 12/04/21 0118 12/04/21 0500  NA 137 136  K 3.7 3.6  CL 106 105  CO2 23 25  GLUCOSE 186* 164*  BUN 26* 25*  CREATININE 0.77 0.79  CALCIUM 10.5* 9.8     No results found for this or any previous visit (from the past 240 hour(s)).   Radiology Studies: CT CERVICAL SPINE WO CONTRAST  Result Date: 12/03/2021 CLINICAL DATA:  Fall on coumadin EXAM: CT CERVICAL SPINE WITHOUT CONTRAST TECHNIQUE: Multidetector CT imaging of the cervical spine was performed without intravenous contrast. Multiplanar CT image reconstructions were also generated. RADIATION DOSE  REDUCTION: This exam was performed according to the departmental dose-optimization program which includes automated exposure control, adjustment of the mA and/or kV according to patient size and/or use of iterative reconstruction technique. COMPARISON:  11/10/2021 FINDINGS: Alignment: Normal Skull base and vertebrae: No acute fracture. No primary bone lesion or focal pathologic process. Soft tissues and spinal canal: No prevertebral fluid or swelling. No visible canal hematoma. Disc levels: Diffuse degenerative disc and facet disease. No visible disc herniation. Upper chest: No acute findings Other: None IMPRESSION: Diffuse degenerative disc and facet disease. No acute bony abnormality. Electronically Signed   By: Rolm Baptise M.D.   On: 12/03/2021 21:20   CT MAXILLOFACIAL WO CONTRAST  Result Date: 12/03/2021 CLINICAL DATA:  Fall, on blood thinners EXAM: CT MAXILLOFACIAL WITHOUT CONTRAST TECHNIQUE: Multidetector CT imaging of the maxillofacial structures was performed. Multiplanar CT image reconstructions were also generated. RADIATION DOSE REDUCTION: This exam was performed according to the departmental dose-optimization program which  includes automated exposure control, adjustment of the mA and/or kV according to patient size and/or use of iterative reconstruction technique. COMPARISON:  11/10/2021 FINDINGS: Osseous: Left nasal bone fractures are again noted, unchanged since prior study. Nasal septum is deviated to the left, likely on a chronic basis, stable since prior study. No acute facial fracture. Orbits: Negative. No traumatic or inflammatory finding. Sinuses: Evidence of prior surgery.  No air-fluid levels. Soft tissues: Soft tissue swelling over the left forehead. Limited intracranial: See head CT report IMPRESSION: Left nasal bone fractures are stable since prior study 11/10/2021. No acute facial or orbital fracture. Electronically Signed   By: Rolm Baptise M.D.   On: 12/03/2021 21:18   CT Head Wo  Contrast  Result Date: 12/03/2021 CLINICAL DATA:  Fall, on blood thinners EXAM: CT HEAD WITHOUT CONTRAST TECHNIQUE: Contiguous axial images were obtained from the base of the skull through the vertex without intravenous contrast. RADIATION DOSE REDUCTION: This exam was performed according to the departmental dose-optimization program which includes automated exposure control, adjustment of the mA and/or kV according to patient size and/or use of iterative reconstruction technique. COMPARISON:  11/10/2021 FINDINGS: Brain: There is atrophy and chronic small vessel disease changes. No acute intracranial abnormality. Specifically, no hemorrhage, hydrocephalus, mass lesion, acute infarction, or significant intracranial injury. Vascular: No hyperdense vessel or unexpected calcification. Skull: No acute calvarial abnormality. Sinuses/Orbits: No acute findings Other: Soft tissue swelling over the left forehead. IMPRESSION: Atrophy, chronic microvascular disease. No acute intracranial abnormality. Electronically Signed   By: Rolm Baptise M.D.   On: 12/03/2021 21:16   DG Humerus Right  Result Date: 12/03/2021 CLINICAL DATA:  Fall, right shoulder pain EXAM: RIGHT HUMERUS - 2+ VIEW COMPARISON:  Right shoulder series FINDINGS: Fracture seen in the proximal right humerus involving the greater tuberosity and likely the humeral neck. No subluxation or dislocation. No additional humeral abnormality. IMPRESSION: Proximal right humeral fracture involving the greater tuberosity likely humeral neck. Electronically Signed   By: Rolm Baptise M.D.   On: 12/03/2021 21:12   DG Shoulder Right  Result Date: 12/03/2021 CLINICAL DATA:  Fall, right shoulder pain EXAM: RIGHT SHOULDER - 2+ VIEW COMPARISON:  None Available. FINDINGS: Fracture noted through the proximal right humerus in the region of the greater tuberosity. Probable humeral neck fracture. No subluxation or dislocation. Degenerative changes in the right AC joint. IMPRESSION:  Proximal right humeral fracture involving the greater tuberosity and likely humeral neck. Electronically Signed   By: Rolm Baptise M.D.   On: 12/03/2021 21:11   DG Elbow 2 Views Right  Result Date: 12/03/2021 CLINICAL DATA:  Fall EXAM: RIGHT ELBOW - 2 VIEW COMPARISON:  None Available. FINDINGS: Degenerative changes in the right elbow with joint space narrowing and spurring. No acute bony abnormality. Specifically, no fracture, subluxation, or dislocation. No joint effusion. IMPRESSION: No acute bony abnormality. Electronically Signed   By: Rolm Baptise M.D.   On: 12/03/2021 21:10    Scheduled Meds:  atorvastatin  80 mg Oral Daily   cyanocobalamin  500 mcg Oral Daily   docusate sodium  100 mg Oral BID   furosemide  20 mg Oral Daily   insulin aspart  0-5 Units Subcutaneous QHS   insulin aspart  0-9 Units Subcutaneous TID WC   lidocaine  1 patch Transdermal Q24H   loratadine  10 mg Oral Daily   magnesium oxide  400 mg Oral Daily   silver sulfADIAZINE  1 Application Topical Daily   tamsulosin  0.4 mg Oral Daily  triamterene-hydrochlorothiazide  1 tablet Oral q morning   Continuous Infusions:  sodium chloride 75 mL/hr at 12/04/21 1148   cefTRIAXone (ROCEPHIN)  IV 1 g (12/04/21 1956)     LOS: 1 day   Shelly Coss, MD [facility] P12/03/2021, 7:33 AM

## 2021-12-06 ENCOUNTER — Encounter: Payer: Medicare Other | Admitting: Internal Medicine

## 2021-12-06 DIAGNOSIS — S42209A Unspecified fracture of upper end of unspecified humerus, initial encounter for closed fracture: Secondary | ICD-10-CM | POA: Diagnosis not present

## 2021-12-06 LAB — PROTIME-INR
INR: 3.1 — ABNORMAL HIGH (ref 0.8–1.2)
Prothrombin Time: 31.3 seconds — ABNORMAL HIGH (ref 11.4–15.2)

## 2021-12-06 LAB — URINE CULTURE: Culture: >=100000 — AB

## 2021-12-06 LAB — CBC
HCT: 30.8 % — ABNORMAL LOW (ref 39.0–52.0)
Hemoglobin: 10.4 g/dL — ABNORMAL LOW (ref 13.0–17.0)
MCH: 31.4 pg (ref 26.0–34.0)
MCHC: 33.8 g/dL (ref 30.0–36.0)
MCV: 93.1 fL (ref 80.0–100.0)
Platelets: 138 10*3/uL — ABNORMAL LOW (ref 150–400)
RBC: 3.31 MIL/uL — ABNORMAL LOW (ref 4.22–5.81)
RDW: 13.4 % (ref 11.5–15.5)
WBC: 11 10*3/uL — ABNORMAL HIGH (ref 4.0–10.5)
nRBC: 0 % (ref 0.0–0.2)

## 2021-12-06 LAB — GLUCOSE, CAPILLARY
Glucose-Capillary: 190 mg/dL — ABNORMAL HIGH (ref 70–99)
Glucose-Capillary: 228 mg/dL — ABNORMAL HIGH (ref 70–99)
Glucose-Capillary: 226 mg/dL — ABNORMAL HIGH (ref 70–99)
Glucose-Capillary: 245 mg/dL — ABNORMAL HIGH (ref 70–99)
Glucose-Capillary: 206 mg/dL — ABNORMAL HIGH (ref 70–99)

## 2021-12-06 LAB — BASIC METABOLIC PANEL
Anion gap: 7 (ref 5–15)
BUN: 26 mg/dL — ABNORMAL HIGH (ref 8–23)
CO2: 22 mmol/L (ref 22–32)
Calcium: 9.8 mg/dL (ref 8.9–10.3)
Chloride: 103 mmol/L (ref 98–111)
Creatinine, Ser: 0.79 mg/dL (ref 0.61–1.24)
GFR, Estimated: >60 mL/min (ref >60)
Glucose, Bld: 166 mg/dL — ABNORMAL HIGH (ref 70–99)
Potassium: 3.7 mmol/L (ref 3.5–5.1)
Sodium: 132 mmol/L — ABNORMAL LOW (ref 135–145)

## 2021-12-06 MED ORDER — CEPHALEXIN 500 MG PO CAPS
500.0000 mg | ORAL_CAPSULE | Freq: Three times a day (TID) | ORAL | Status: DC
  Administered 2021-12-06 – 2021-12-07 (×3): 500 mg via ORAL
  Filled 2021-12-06 (×3): qty 1

## 2021-12-06 MED ORDER — WARFARIN SODIUM 2.5 MG PO TABS
2.5000 mg | ORAL_TABLET | Freq: Once | ORAL | Status: AC
  Administered 2021-12-06: 2.5 mg via ORAL
  Filled 2021-12-06: qty 1

## 2021-12-06 MED ORDER — WARFARIN - PHARMACIST DOSING INPATIENT: Freq: Every day | Status: DC

## 2021-12-06 NOTE — Evaluation (Signed)
Occupational Therapy Evaluation Patient Details Name: Michael Colon MRN: [id number] DOB: 07/06/1930 Today's Date: 12/06/2021   History of Present Illness Patient is a 88 year old male with history of hypertension, hyperlipidemia, diabetes type 2, stroke, asthma, status post pacemaker placement due to complete heart block, BPH, A-fib on Coumadin, recent left ankle injury who presented with fall and right arm pain from retirement  [employer]. 12/03/21 R humerus x-ray: Proximal right humeral fracture involving the greater tuberosity  likely humeral neck. Of note pt has L olecranon fx and L nasal bone fx from 11/10/21, L elbow in cast.   Clinical Impression   [name] was seen for OT/PT co-evaluation this date. Prior to hospital admission, pt was MOD I for mobility using walking stick, recent L olecranon fx however reports MOD I for ADLs. Pt lives alon eat [facility]. Pt presents to acute OT demonstrating impaired ADL performance and functional mobility 2/2 functional impaired use of BUE, decreased activity tolerance and functional strength/ROM/balance deficits. Pt currently requires MOD A don/doff B shoulder slings seated EOB. MAX A don B socks. MOD A x2 sit<>stand at EOB x2, pt appears fearful of falling, cues for transfer technique. MAX A x2 bed>chair SPT. SETUP self-drinking using LUE only. Left in chair with all needs in reach. Pt would benefit from skilled OT to address noted impairments and functional limitations (see below for any additional details). Upon hospital discharge, recommend STR to maximize pt safety and return to PLOF.    Recommendations for follow up therapy are one component of a multi-disciplinary discharge planning process, led by the attending physician.  Recommendations may be updated based on patient status, additional functional criteria and insurance authorization.   Follow Up Recommendations  Skilled nursing-short term rehab (<3 hours/day)     Assistance  Recommended at Discharge Frequent or constant Supervision/Assistance  Patient can return home with the following Two people to help with walking and/or transfers;Two people to help with bathing/dressing/bathroom;Help with stairs or ramp for entrance    Functional Status Assessment  Patient has had a recent decline in their functional status and demonstrates the ability to make significant improvements in function in a reasonable and predictable amount of time.  Equipment Recommendations  Other (comment) (defer)    Recommendations for Other Services       Precautions / Restrictions Precautions Precautions: Fall Restrictions Weight Bearing Restrictions: Yes RUE Weight Bearing: Non weight bearing LUE Weight Bearing: Non weight bearing      Mobility Bed Mobility Overal bed mobility: Needs Assistance Bed Mobility: Supine to Sit     Supine to sit: Min assist     General bed mobility comments: assist for trunk and scooting hips    Transfers Overall transfer level: Needs assistance   Transfers: Sit to/from Stand, Bed to chair/wheelchair/BSC Sit to Stand: Mod assist, +2 physical assistance, From elevated surface Stand pivot transfers: Max assist, +2 physical assistance                Balance Overall balance assessment: Needs assistance Sitting-balance support: No upper extremity supported, Feet supported Sitting balance-Leahy Scale: Fair     Standing balance support: No upper extremity supported, During functional activity Standing balance-Leahy Scale: Poor                             ADL either performed or assessed with clinical judgement   ADL Overall ADL's : Needs assistance/impaired  General ADL Comments: MOD A don/doff B shoulder slings seated EOB. MAX A don B socks. MAX A x2 for simulated BSC t/f. SETUP self-drinking using LUE only      Pertinent Vitals/Pain Pain Assessment Pain  Assessment: 0-10 Pain Score: 9  Pain Location: R shoulder/knee Pain Descriptors / Indicators: Dull, Grimacing, Aching, Constant Pain Intervention(s): Limited activity within patient's tolerance, Repositioned, Patient requesting pain meds-RN notified     Hand Dominance Right   Extremity/Trunk Assessment Upper Extremity Assessment Upper Extremity Assessment: RUE deficits/detail;LUE deficits/detail RUE: Unable to fully assess due to immobilization LUE: Unable to fully assess due to immobilization   Lower Extremity Assessment Lower Extremity Assessment: Generalized weakness       Communication Communication Communication: HOH   Cognition Arousal/Alertness: Awake/alert Behavior During Therapy: WFL for tasks assessed/performed Overall Cognitive Status: Within Functional Limits for tasks assessed                                 General Comments: requires step by step cues for trasnfer technique; appears fearful of falling                Home Living Family/patient expects to be discharged to:: Other (Comment)                                 Additional Comments: [facility]      Prior Functioning/Environment Prior Level of Function : Independent/Modified Independent             Mobility Comments: uses walking stick ADLs Comments: was managing with recent L elbow fx without assist for ADLs        OT Problem List: Decreased strength;Decreased range of motion;Decreased activity tolerance;Impaired balance (sitting and/or standing);Decreased safety awareness;Decreased knowledge of use of DME or AE;Impaired UE functional use      OT Treatment/Interventions: Self-care/ADL training;Therapeutic exercise;Energy conservation;DME and/or AE instruction;Therapeutic activities;Balance training;Patient/family education    OT Goals(Current goals can be found in the care plan section) Acute Rehab OT Goals Patient Stated Goal: to go home OT Goal  Formulation: With patient Time For Goal Achievement: 12/20/21 Potential to Achieve Goals: Good ADL Goals Pt Will Perform Grooming: with mod assist;sitting Pt Will Perform Lower Body Dressing: with mod assist;sit to/from stand Pt Will Transfer to Toilet: with min assist;stand pivot transfer;bedside commode  OT Frequency: Min 2X/week    Co-evaluation PT/OT/SLP Co-Evaluation/Treatment: Yes Reason for Co-Treatment: Complexity of the patient's impairments (multi-system involvement);For patient/therapist safety PT goals addressed during session: Mobility/safety with mobility OT goals addressed during session: ADL's and self-care      AM-PAC OT "6 Clicks" Daily Activity     Outcome Measure Help from another person eating meals?: A Little Help from another person taking care of personal grooming?: A Lot Help from another person toileting, which includes using toliet, bedpan, or urinal?: A Lot Help from another person bathing (including washing, rinsing, drying)?: A Lot Help from another person to put on and taking off regular upper body clothing?: A Lot Help from another person to put on and taking off regular lower body clothing?: A Lot 6 Click Score: 13   End of Session Nurse Communication: Patient requests pain meds;Mobility status;Need for lift equipment  Activity Tolerance: Patient tolerated treatment well;Patient limited by pain Patient left: in chair;with call bell/phone within reach;with nursing/sitter in room;with chair alarm set  OT Visit Diagnosis:  Other abnormalities of gait and mobility (R26.89);Muscle weakness (generalized) (M62.81)                Time: 3200-9417 OT Time Calculation (min): 36 min Charges:  OT General Charges $OT Visit: 1 Visit OT Evaluation $OT Eval Moderate Complexity: 1 Mod OT Treatments $Self Care/Home Management : 8-22 mins  Dessie Coma, M.S. OTR/L  12/06/21, 10:45 AM  ascom [phone]

## 2021-12-06 NOTE — Consult Note (Signed)
[place] for Warfarin Indication: atrial fibrillation  Allergies  Allergen Reactions   Lovenox [Enoxaparin] Hives   Proscar [Finasteride] Other (See Comments)    Upper body and arm weakness   Latex Rash    Has trouble with BANDAIDS that have been left on for more than 24 hours. Prefers paper tape.   Nickel Rash    Localized rash   Percocet [Oxycodone-Acetaminophen] Rash   Tape Rash and Other (See Comments)    Sensitivity     Patient Measurements: Height: '5\' 10"'$  (177.8 cm) IBW/kg (Calculated) : 73 Heparin Dosing Weight: 91.4 kg  Vital Signs: Temp: 97.7 F (36.5 C) (12/04 0638) Temp Source: Oral (12/04 0638) BP: 147/61 (12/04 3343) Pulse Rate: 61 (12/04 0638)  Labs: Recent Labs    12/04/21 0118 12/04/21 0500 12/05/21 1118 12/06/21 0348  HGB 12.0* 11.0*  --  10.4*  HCT 34.8* 32.9*  --  30.8*  PLT 174 157  --  138*  LABPROT  --   --  38.0* 31.3*  INR  --   --  3.9* 3.1*  CREATININE 0.77 0.79  --  0.79     Estimated Creatinine Clearance: 71.2 mL/min (by C-G formula based on SCr of 0.79 mg/dL).   Medical History: Past Medical History:  Diagnosis Date   Asthma    Atrial flutter ([place]) 2007   Band keratopathy    Benign prostatic hypertrophy    Chronic ear infection    Chronic kidney disease    Chronic prostatitis    Dental bridge present    permanent - upper   Diabetes mellitus    Elbow stiffness, left    s/p fracture many yrs ago.  arm does not straighten   GERD (gastroesophageal reflux disease)    laryngeal involvement   Hyperlipidemia    Hypertension    Obstructive sleep apnea    CPAP-9   Osteoarthrosis, localized, primary, knee    post-traumatic   Prostatitis    Stroke ([place])    TIA   Tachy-brady syndrome ([place])    UTI (urinary tract infection)     Medications:  PTA: warfarin '5mg'$  MWFSa, 7.'5mg'$  WuTuTh Last dose: patient unsure but knows it was sometime before 12/02/21  Assessment: Pharmacy has been  consulted to reinitiate warfarin in 88yo male with history of atrial fibrillation who presented to the ED on 12/02 after an unwitnessed fall. Patient injured his head and right arm, no loss of consciousness, however. X-ray of right shoulder/humerus showed right proximal humerus fracture. Orthopedics consulted, recommended conservative management and cleared to resume warfarin  Date INR Dose 12/3 3.9 hold 12/4 3.1 Warfarin 2.5 mg po x 1  Goal of Therapy:  INR 2-3 Monitor platelets by anticoagulation protocol: Yes   Plan:  INR currently supratherapeutic Will give reduced dose today of Warfarin 2.5 mg x 1, since INR has dropped to close to therapeutic range INR/CBC daily  Lorin Picket, PharmD 12/06/2021,9:12 AM

## 2021-12-06 NOTE — TOC Progression Note (Signed)
Transition of Care [facility]) - Progression Note    Patient Details  Name: Michael Colon MRN: [id number] Date of Birth: May 04, 1930  Transition of Care [facility] CM/SW Contact  Laurena Slimmer, RN Phone Number: 12/06/2021, 4:40 PM  Clinical Narrative:    Therapy rec for SNF Patient is from [facility] and is requesting SNF Spoke with Seth Bake in admissions.  [place] started.           Expected Discharge Plan and Services                                                 Social Determinants of Health (SDOH) Interventions Food Insecurity Interventions: Intervention Not Indicated Housing Interventions: Intervention Not Indicated Transportation Interventions: Intervention Not Indicated Utilities Interventions: Patient Refused  Readmission Risk Interventions     No data to display

## 2021-12-06 NOTE — Progress Notes (Signed)
PROGRESS NOTE  GAYLAN FAUVER  ZYS:[id number] DOB: 06-Feb-1930 DOA: 12/04/2021 PCP: Venia Carbon, MD   Brief Narrative: Patient is a 88 year old male with history of hypertension, hyperlipidemia, diabetes type 2, stroke, asthma, status post pacemaker placement due to complete heart block, BPH, A-fib on Coumadin, recent left ankle injury who presented with fall and right arm pain from retirement  [employer].  No history of loss of consciousness.  Complained of right upper arm pain which was severe.  Found to have hematoma in the left forehead and bruise around the left eye.  On presentation, UA showed large amount of leukocytes, positive nitrates.  He was hemodynamically stable.  CT head/C-spine negative for acute findings.  CT of the maxillofacial showed subacute left nasal bone fractures but no acute fracture.  X-ray of the right lower showed right proximal humerus fracture.  Orthopedics consulted.  Plan for conservative management.  PT/OT consulted,recommending SnF  Assessment & Plan:  Principal Problem:   Proximal humerus fracture_right Active Problems:   Fall   UTI (urinary tract infection)   Chronic diastolic heart failure (HCC)   Atrial fibrillation, chronic (HCC)   Diabetes mellitus without complication (HCC)   Essential hypertension   HLD (hyperlipidemia)   Stroke ([place])   BPH with obstruction/lower urinary tract symptoms  Right proximal humerus fracture/fall: X-ray of right shoulder/humerus showed right proximal humerus fracture.  Orthopedics ,Dr Randalyn Rhea.  Recommended conservative management, use of sling for 4 to 6 weeks.  Recommended outpatient follow-up in 4 weeks with repeat chest x-rays as outpatient.  PT/OT consulted,recommended SnF.  Continue pain management, supportive care. Patient recently fractured his left elbow and currently on a cast. Patient is currently residing at retirement community, [facility].TOC consulted.  UTI: Unclear if he had  urinary symptoms but UA showed leukocytes, positive nitrates.  He also had mild leukocytosis on presentation which has resolved. Started on ceftriaxone.  Urine culture showed pansensitive E. coli.  Antibiotics changed to oral  Chronic diastolic CHF: 2D echo on 09/05/2353 showed EF of 55 to 73%, grade 2 diastolic dysfunction.  No leg edema.  Mild elevated BNP.  On Lasix as needed at home.  On Lasix 20 mg daily right now  Chronic A-fib: Currently rate is controlled.EKG showed junctional rhythm.  Takes Coumadin. INR is supratherapeutic  Diabetes type 2: Recent A1c of 7.6.  Takes metformin.  Continue sliding scale insulin for now.  Monitor blood sugars.  Hypertension: On  Dyazide.  Monitor blood pressure.  Continue prn medications for severe hypertension  Hyperlipidemia: On Lipitor  Stroke: Takes Lipitor.  Ambulates with help of walker  BPH: On Proscar and Flomax.        DVT prophylaxis:SCDs Start: 12/04/21 2202 warfarin (COUMADIN) tablet 2.5 mg     Code Status: Partial Code  Family Communication: Discussed with son on phone on 12/3  Patient status:Inpatient  Patient is from: [facility] retirement community  Anticipated discharge to:SNF  Estimated DC date: whenever possible,medically stable fo dc   Consultants: Orthopedics  Procedures:None  Antimicrobials:  Anti-infectives (From admission, onward)    Start     Dose/Rate Route Frequency Ordered Stop   12/06/21 1400  cephALEXin (KEFLEX) capsule 500 mg        500 mg Oral Every 8 hours 12/06/21 1140 12/09/21 1359   12/04/21 2200  cefTRIAXone (ROCEPHIN) 1 g in sodium chloride 0.9 % 100 mL IVPB  Status:  Discontinued        1 g 200 mL/hr over 30 Minutes Intravenous  Every 24 hours 12/04/21 0224 12/06/21 1140   12/04/21 0100  cefTRIAXone (ROCEPHIN) 1 g in sodium chloride 0.9 % 100 mL IVPB        1 g 200 mL/hr over 30 Minutes Intravenous  Once 12/04/21 0049 12/04/21 0238       Subjective:  Patient seen and examined at  bedside today.  He was being evaluated by therapies.  He was lying in bed, comfortable.  Alert and oriented, hemodynamically stable.  Denies new complaints today.  Pain well controlled   Objective: Vitals:   12/05/21 1706 12/05/21 2155 12/06/21 0638 12/06/21 0928  BP: (!) 147/52 125/64 (!) 147/61 (!) 152/57  Pulse: 62 60 61 64  Resp: '16  16 18  '$ Temp: (!) 97.4 F (36.3 C)  97.7 F (36.5 C)   TempSrc:   Oral   SpO2: 98%  100% 100%  Height:        Intake/Output Summary (Last 24 hours) at 12/06/2021 1140 Last data filed at 12/06/2021 0703 Gross per 24 hour  Intake 120 ml  Output 850 ml  Net -730 ml   There were no vitals filed for this visit.  Examination:  General exam: Overall comfortable, not in distress, pleasant elderly gentleman HEENT: PERRL, facial ecchymosis Respiratory system:  no wheezes or crackles  Cardiovascular system: S1 & S2 heard, RRR.  Gastrointestinal system: Abdomen is nondistended, soft and nontender. Central nervous system: Alert and oriented Extremities: No edema, no clubbing ,no cyanosis, cast on the left upper extremity, sling on the right upper extremity Skin: No rashes, no ulcers,no icterus     Data Reviewed: I have personally reviewed following labs and imaging studies  CBC: Recent Labs  Lab 12/04/21 0118 12/04/21 0500 12/06/21 0348  WBC 12.3* 9.5 11.0*  NEUTROABS 10.2*  --   --   HGB 12.0* 11.0* 10.4*  HCT 34.8* 32.9* 30.8*  MCV 92.3 93.7 93.1  PLT 174 157 856*   Basic Metabolic Panel: Recent Labs  Lab 12/04/21 0118 12/04/21 0500 12/06/21 0348  NA 137 136 132*  K 3.7 3.6 3.7  CL 106 105 103  CO2 '23 25 22  '$ GLUCOSE 186* 164* 166*  BUN 26* 25* 26*  CREATININE 0.77 0.79 0.79  CALCIUM 10.5* 9.8 9.8     Recent Results (from the past 240 hour(s))  Urine Culture     Status: Abnormal   Collection Time: 12/03/21 10:53 PM   Specimen: Urine, Random  Result Value Ref Range Status   Specimen Description   Final    URINE,  RANDOM Performed at [facility], [address], [place], [place] [address]    Special Requests   Final    NONE Performed at [facility], [address] [place], [place] [address]    Culture >=100,000 COLONIES/mL ESCHERICHIA COLI (A)  Final   Report Status 12/06/2021 FINAL  Final   Organism ID, Bacteria ESCHERICHIA COLI (A)  Final      Susceptibility   Escherichia coli - MIC*    AMPICILLIN >=32 RESISTANT Resistant     CEFAZOLIN <=4 SENSITIVE Sensitive     CEFEPIME <=0.12 SENSITIVE Sensitive     CEFTRIAXONE <=0.25 SENSITIVE Sensitive     CIPROFLOXACIN <=0.25 SENSITIVE Sensitive     GENTAMICIN <=1 SENSITIVE Sensitive     IMIPENEM <=0.25 SENSITIVE Sensitive     NITROFURANTOIN <=16 SENSITIVE Sensitive     TRIMETH/SULFA <=20 SENSITIVE Sensitive     AMPICILLIN/SULBACTAM 16 INTERMEDIATE Intermediate     PIP/TAZO <=4  SENSITIVE Sensitive     * >=100,000 COLONIES/mL ESCHERICHIA COLI     Radiology Studies: No results found.  Scheduled Meds:  atorvastatin  80 mg Oral Daily   bisacodyl  5 mg Oral BID   cephALEXin  500 mg Oral Q8H   cyanocobalamin  500 mcg Oral Daily   docusate sodium  100 mg Oral BID   furosemide  20 mg Oral Daily   insulin aspart  0-5 Units Subcutaneous QHS   insulin aspart  0-9 Units Subcutaneous TID WC   lidocaine  1 patch Transdermal Q24H   loratadine  10 mg Oral Daily   magnesium oxide  400 mg Oral Daily   silver sulfADIAZINE  1 Application Topical Daily   tamsulosin  0.4 mg Oral Daily   triamterene-hydrochlorothiazide  1 tablet Oral q morning   warfarin  2.5 mg Oral ONCE-1600   Warfarin - Pharmacist Dosing Inpatient   Does not apply q1600   Continuous Infusions:     LOS: 2 days   Shelly Coss, MD [facility] P12/04/2021, 11:40 AM

## 2021-12-06 NOTE — Evaluation (Addendum)
Physical Therapy Evaluation Patient Details Name: Michael Colon MRN: [id number] DOB: [birth year] Today's Date: 12/06/2021  History of Present Illness  Patient is a 88 year old male with history of hypertension, hyperlipidemia, diabetes type 2, stroke, asthma, status post pacemaker placement due to complete heart block, BPH, A-fib on Coumadin, recent left ankle injury who presented with fall and right arm pain from retirement  [employer]. 12/03/21 R humerus x-ray: Proximal right humeral fracture involving the greater tuberosity  likely humeral neck. Of note pt has L olecranon fx and L nasal bone fx from 11/10/21, L elbow in cast.  Clinical Impression  Pt found supine in bed upon PT entry, seen as a co-evaluation to maximize safety and function.  Supine<>Sit with min assist x1. Sit<>Stand mod assist x2. Stand pivot transfer to chair max assist x2. Pt required max encouragement for mobility. Pt would benefit from skilled physical therapy to address the listed deficits (see below) to increase independence with ADLs and function. Current recommendation is SNF to return pt to PLOF.           Recommendations for follow up therapy are one component of a multi-disciplinary discharge planning process, led by the attending physician.  Recommendations may be updated based on patient status, additional functional criteria and insurance authorization.  Follow Up Recommendations Skilled nursing-short term rehab (<3 hours/day) Can patient physically be transported by private vehicle: No    Assistance Recommended at Discharge Frequent or constant Supervision/Assistance  Patient can return home with the following  A lot of help with walking and/or transfers;A lot of help with bathing/dressing/bathroom;Assistance with cooking/housework;Assist for transportation;Help with stairs or ramp for entrance    Equipment Recommendations None recommended by PT  Recommendations for Other Services        Functional Status Assessment Patient has had a recent decline in their functional status and demonstrates the ability to make significant improvements in function in a reasonable and predictable amount of time.     Precautions / Restrictions Precautions Precautions: Fall Restrictions Weight Bearing Restrictions: Yes RUE Weight Bearing: Non weight bearing LUE Weight Bearing: Non weight bearing      Mobility  Bed Mobility Overal bed mobility: Needs Assistance Bed Mobility: Supine to Sit     Supine to sit: Min assist          Transfers Overall transfer level: Needs assistance   Transfers: Sit to/from Stand, Bed to chair/wheelchair/BSC Sit to Stand: Mod assist, +2 physical assistance, From elevated surface Stand pivot transfers: Max assist, +2 physical assistance              Ambulation/Gait                  Stairs            Wheelchair Mobility    Modified Rankin (Stroke Patients Only)       Balance Overall balance assessment: Needs assistance Sitting-balance support: No upper extremity supported, Feet supported Sitting balance-Leahy Scale: Fair     Standing balance support: No upper extremity supported, During functional activity Standing balance-Leahy Scale: Poor                               Pertinent Vitals/Pain Pain Assessment Pain Score: 4  Pain Location: R shoulder/knee. Pain progressed to a 9/10 Pain Descriptors / Indicators: Dull, Grimacing, Aching, Constant Pain Intervention(s): Limited activity within patient's tolerance, Monitored during session, Patient requesting pain meds-RN notified, Repositioned  Home Living Family/patient expects to be discharged to:: Other (Comment)                   Additional Comments: [facility]    Prior Function Prior Level of Function : Independent/Modified Independent             Mobility Comments: uses walking stick ADLs Comments: was managing with recent L  elbow fx without assist for ADLs     Hand Dominance   Dominant Hand: Right    Extremity/Trunk Assessment   Upper Extremity Assessment Upper Extremity Assessment: RUE deficits/detail;LUE deficits/detail RUE: Unable to fully assess due to immobilization LUE: Unable to fully assess due to immobilization    Lower Extremity Assessment Lower Extremity Assessment: Generalized weakness    Cervical / Trunk Assessment Cervical / Trunk Assessment: Normal  Communication   Communication: HOH  Cognition Arousal/Alertness: Awake/alert Behavior During Therapy: WFL for tasks assessed/performed Overall Cognitive Status: Within Functional Limits for tasks assessed                                 General Comments: requires VC for stepping        General Comments      Exercises     Assessment/Plan    PT Assessment Patient needs continued PT services  PT Problem List Decreased strength;Decreased balance;Decreased cognition;Decreased range of motion;Decreased mobility;Decreased knowledge of use of DME;Decreased activity tolerance       PT Treatment Interventions DME instruction;Functional mobility training;Balance training;Patient/family education;Gait training;Therapeutic activities;Neuromuscular re-education;Stair training;Wheelchair mobility training;Therapeutic exercise    PT Goals (Current goals can be found in the Care Plan section)  Acute Rehab PT Goals Patient Stated Goal: to return home PT Goal Formulation: With patient Time For Goal Achievement: 12/20/21 Potential to Achieve Goals: Good    Frequency Min 2X/week     Co-evaluation PT/OT/SLP Co-Evaluation/Treatment: Yes Reason for Co-Treatment: Complexity of the patient's impairments (multi-system involvement) PT goals addressed during session: Mobility/safety with mobility;Balance;Proper use of DME;Strengthening/ROM OT goals addressed during session: ADL's and self-care       AM-PAC PT "6 Clicks"  Mobility  Outcome Measure Help needed turning from your back to your side while in a flat bed without using bedrails?: A Little Help needed moving from lying on your back to sitting on the side of a flat bed without using bedrails?: A Little Help needed moving to and from a bed to a chair (including a wheelchair)?: A Lot Help needed standing up from a chair using your arms (e.g., wheelchair or bedside chair)?: A Lot Help needed to walk in hospital room?: Total Help needed climbing 3-5 steps with a railing? : Total 6 Click Score: 12    End of Session   Activity Tolerance: Patient tolerated treatment well Patient left: in chair;with call bell/phone within reach;with chair alarm set Nurse Communication: Mobility status PT Visit Diagnosis: Unsteadiness on feet (R26.81);Other abnormalities of gait and mobility (R26.89);Repeated falls (R29.6);Muscle weakness (generalized) (M62.81);History of falling (Z91.81);Difficulty in walking, not elsewhere classified (R26.2)    Time: 1610-9604 PT Time Calculation (min) (ACUTE ONLY): 34 min   Charges:            Aletha Halim, SPT  12/06/2021, 12:37 PM

## 2021-12-06 NOTE — TOC Initial Note (Signed)
Transition of Care [facility] - Initial/Assessment Note    Patient Details  Name: Michael Colon MRN: [id number] Date of Birth: [birth year]  Transition of Care [facility]) CM/SW Contact:    Laurena Slimmer, RN Phone Number: 12/06/2021, 9:33 AM  Clinical Narrative:                  Transition of Care (TOC) Screening Note   Patient Details  Name: Michael Colon Date of Birth: [birth year]   Transition of Care [facility]) CM/SW Contact:    Laurena Slimmer, RN Phone Number: 12/06/2021, 9:33 AM    Transition of Care Department [facility]) has reviewed patient and no TOC needs have been identified at this time. We will continue to monitor patient advancement through interdisciplinary progression rounds. If new patient transition needs arise, please place a TOC consult.          Patient Goals and CMS Choice        Expected Discharge Plan and Services                                                Prior Living Arrangements/Services                       Activities of Daily Living Home Assistive Devices/Equipment: Cane (specify quad or straight) ADL Screening (condition at time of admission) Patient's cognitive ability adequate to safely complete daily activities?: Yes Is the patient deaf or have difficulty hearing?: No Does the patient have difficulty seeing, even when wearing glasses/contacts?: No Does the patient have difficulty concentrating, remembering, or making decisions?: No Patient able to express need for assistance with ADLs?: Yes Does the patient have difficulty dressing or bathing?: Yes Independently performs ADLs?: No Communication: Independent Grooming: Needs assistance Is this a change from baseline?: Change from baseline, expected to last >3 days Feeding: Independent with device (comment) Bathing: Needs assistance Toileting: Needs assistance In/Out Bed: Needs assistance Walks in Home: Independent with device (comment) Does the patient have difficulty  walking or climbing stairs?: Yes Weakness of Legs: Both Weakness of Arms/Hands: Both  Permission Sought/Granted                  Emotional Assessment              Admission diagnosis:  Fall [W19.XXXA] Proximal humerus fracture [S42.209A] Closed fracture of head of right humerus, initial encounter [S42.291A] Urinary tract infection without hematuria, site unspecified [N39.0] Patient Active Problem List   Diagnosis Date Noted   Fall 12/04/2021   Proximal humerus fracture_right 12/04/2021   UTI (urinary tract infection) 12/04/2021   HLD (hyperlipidemia) 12/04/2021   Diabetes mellitus without complication ([place]) 40/34/7425   Stroke ([place]) 12/04/2021   Atrial fibrillation, chronic ([place]) 12/04/2021   Left elbow fracture 11/30/2021   Leg ulcer ([place]) 11/08/2021   Complete heart block ([place]) 09/01/2021   Orchitis of right testicle 11/30/2020   Onychomycosis 11/30/2020   Hematoma 03/06/2020   Chronic diastolic heart failure ([place]) 10/25/2019   Hypercalcemia 10/25/2019   Venous stasis ulcer ([place]) 08/20/2019   Chondrocalcinosis 09/05/2018   Primary osteoarthritis of right knee 08/19/2018   Aortic atherosclerosis ([place]) 12/28/2016   Chronic venous insufficiency 04/12/2016   Thrombocytopenia ([place]) 06/25/2015   Obstructive sleep apnea 06/25/2015   Enlarged prostate without lower urinary tract symptoms (luts) 10/14/2014   Advanced  directives, counseling/discussion 12/13/2013   Encounter for therapeutic drug monitoring 01/30/2013   Routine general medical examination at a health care facility 12/11/2012   TIA (transient ischemic attack) 12/11/2012   Ventricular tachycardia-pause dependent 10/16/2012   Sinus bradycardia 09/07/2012   Bradycardia 09/07/2012   Edema 09/07/2012   Localized edema 09/07/2012   Mixed hyperlipidemia    Abnormal EKG 09/26/2011   Atrial flutter ([place]) 07/21/2010   Encounter for anticoagulation discussion and counseling 07/21/2010   Type 2 diabetes,  controlled, with neuropathy ([place])    Tachy-brady syndrome ([place])    BPH with obstruction/lower urinary tract symptoms    Unspecified asthma(493.90)    GERD (gastroesophageal reflux disease)    Essential hypertension    PCP:  Venia Carbon, MD Pharmacy:   [facility] [address], [place] - [place] [address] Phone: [phone] Fax: [place] [place] [address] Phone: [phone] Fax: [phone]  [facility] [address] [place] [address] Phone: [phone] Fax: [phone]     Social Determinants of Health (SDOH) Interventions Food Insecurity Interventions: Intervention Not Indicated Housing Interventions: Intervention Not Indicated Transportation Interventions: Intervention Not Indicated Utilities Interventions: Patient Refused  Readmission Risk Interventions     No data to display

## 2021-12-07 DIAGNOSIS — I5032 Chronic diastolic (congestive) heart failure: Secondary | ICD-10-CM | POA: Diagnosis not present

## 2021-12-07 DIAGNOSIS — Z743 Need for continuous supervision: Secondary | ICD-10-CM | POA: Diagnosis not present

## 2021-12-07 DIAGNOSIS — Z711 Person with feared health complaint in whom no diagnosis is made: Secondary | ICD-10-CM | POA: Diagnosis not present

## 2021-12-07 DIAGNOSIS — S42291D Other displaced fracture of upper end of right humerus, subsequent encounter for fracture with routine healing: Secondary | ICD-10-CM | POA: Diagnosis not present

## 2021-12-07 DIAGNOSIS — I495 Sick sinus syndrome: Secondary | ICD-10-CM | POA: Diagnosis not present

## 2021-12-07 DIAGNOSIS — Z950 Presence of cardiac pacemaker: Secondary | ICD-10-CM | POA: Diagnosis not present

## 2021-12-07 DIAGNOSIS — I442 Atrioventricular block, complete: Secondary | ICD-10-CM | POA: Diagnosis not present

## 2021-12-07 DIAGNOSIS — I10 Essential (primary) hypertension: Secondary | ICD-10-CM | POA: Diagnosis not present

## 2021-12-07 DIAGNOSIS — I4892 Unspecified atrial flutter: Secondary | ICD-10-CM | POA: Diagnosis not present

## 2021-12-07 DIAGNOSIS — M6281 Muscle weakness (generalized): Secondary | ICD-10-CM | POA: Diagnosis not present

## 2021-12-07 DIAGNOSIS — K219 Gastro-esophageal reflux disease without esophagitis: Secondary | ICD-10-CM | POA: Diagnosis not present

## 2021-12-07 DIAGNOSIS — R3989 Other symptoms and signs involving the genitourinary system: Secondary | ICD-10-CM | POA: Diagnosis not present

## 2021-12-07 DIAGNOSIS — I110 Hypertensive heart disease with heart failure: Secondary | ICD-10-CM | POA: Diagnosis not present

## 2021-12-07 DIAGNOSIS — S42209A Unspecified fracture of upper end of unspecified humerus, initial encounter for closed fracture: Secondary | ICD-10-CM | POA: Diagnosis not present

## 2021-12-07 DIAGNOSIS — W19XXXA Unspecified fall, initial encounter: Secondary | ICD-10-CM | POA: Diagnosis not present

## 2021-12-07 DIAGNOSIS — T148XXA Other injury of unspecified body region, initial encounter: Secondary | ICD-10-CM | POA: Diagnosis not present

## 2021-12-07 DIAGNOSIS — Z741 Need for assistance with personal care: Secondary | ICD-10-CM | POA: Diagnosis not present

## 2021-12-07 DIAGNOSIS — S42201D Unspecified fracture of upper end of right humerus, subsequent encounter for fracture with routine healing: Secondary | ICD-10-CM | POA: Diagnosis not present

## 2021-12-07 DIAGNOSIS — R278 Other lack of coordination: Secondary | ICD-10-CM | POA: Diagnosis not present

## 2021-12-07 DIAGNOSIS — S42402D Unspecified fracture of lower end of left humerus, subsequent encounter for fracture with routine healing: Secondary | ICD-10-CM | POA: Diagnosis not present

## 2021-12-07 DIAGNOSIS — S52022D Displaced fracture of olecranon process without intraarticular extension of left ulna, subsequent encounter for closed fracture with routine healing: Secondary | ICD-10-CM | POA: Diagnosis not present

## 2021-12-07 DIAGNOSIS — R531 Weakness: Secondary | ICD-10-CM | POA: Diagnosis not present

## 2021-12-07 DIAGNOSIS — I482 Chronic atrial fibrillation, unspecified: Secondary | ICD-10-CM | POA: Diagnosis not present

## 2021-12-07 DIAGNOSIS — N138 Other obstructive and reflux uropathy: Secondary | ICD-10-CM | POA: Diagnosis not present

## 2021-12-07 DIAGNOSIS — S42201A Unspecified fracture of upper end of right humerus, initial encounter for closed fracture: Secondary | ICD-10-CM | POA: Diagnosis not present

## 2021-12-07 DIAGNOSIS — E119 Type 2 diabetes mellitus without complications: Secondary | ICD-10-CM | POA: Diagnosis not present

## 2021-12-07 DIAGNOSIS — I700 Atherosclerosis of aorta: Secondary | ICD-10-CM | POA: Diagnosis not present

## 2021-12-07 DIAGNOSIS — N390 Urinary tract infection, site not specified: Secondary | ICD-10-CM | POA: Diagnosis not present

## 2021-12-07 DIAGNOSIS — R2681 Unsteadiness on feet: Secondary | ICD-10-CM | POA: Diagnosis not present

## 2021-12-07 DIAGNOSIS — N401 Enlarged prostate with lower urinary tract symptoms: Secondary | ICD-10-CM | POA: Diagnosis not present

## 2021-12-07 LAB — CBC
HCT: 30.9 % — ABNORMAL LOW (ref 39.0–52.0)
Hemoglobin: 10.7 g/dL — ABNORMAL LOW (ref 13.0–17.0)
MCH: 32.2 pg (ref 26.0–34.0)
MCHC: 34.6 g/dL (ref 30.0–36.0)
MCV: 93.1 fL (ref 80.0–100.0)
Platelets: 139 10*3/uL — ABNORMAL LOW (ref 150–400)
RBC: 3.32 MIL/uL — ABNORMAL LOW (ref 4.22–5.81)
RDW: 13.3 % (ref 11.5–15.5)
WBC: 10.6 10*3/uL — ABNORMAL HIGH (ref 4.0–10.5)
nRBC: 0 % (ref 0.0–0.2)

## 2021-12-07 LAB — PROTIME-INR
INR: 2.3 — ABNORMAL HIGH (ref 0.8–1.2)
Prothrombin Time: 25 seconds — ABNORMAL HIGH (ref 11.4–15.2)

## 2021-12-07 LAB — GLUCOSE, CAPILLARY
Glucose-Capillary: 150 mg/dL — ABNORMAL HIGH (ref 70–99)
Glucose-Capillary: 210 mg/dL — ABNORMAL HIGH (ref 70–99)

## 2021-12-07 MED ORDER — SENNA 8.6 MG PO TABS: 1.0000 | ORAL_TABLET | Freq: Every day | ORAL | 0 refills | Status: DC

## 2021-12-07 MED ORDER — FUROSEMIDE 20 MG PO TABS: 20.0000 mg | ORAL_TABLET | Freq: Every day | ORAL | Status: DC

## 2021-12-07 MED ORDER — TRAMADOL HCL 50 MG PO TABS: 50.0000 mg | ORAL_TABLET | Freq: Four times a day (QID) | ORAL | 0 refills | Status: DC | PRN

## 2021-12-07 MED ORDER — WARFARIN SODIUM 7.5 MG PO TABS
7.5000 mg | ORAL_TABLET | Freq: Once | ORAL | Status: DC
  Filled 2021-12-07: qty 1

## 2021-12-07 MED ORDER — LIDOCAINE 5 % EX PTCH: 1.0000 | MEDICATED_PATCH | CUTANEOUS | 0 refills | Status: AC

## 2021-12-07 MED ORDER — IBUPROFEN 200 MG PO TABS: 200.0000 mg | ORAL_TABLET | Freq: Four times a day (QID) | ORAL | 0 refills | Status: DC | PRN

## 2021-12-07 MED ORDER — CEPHALEXIN 500 MG PO CAPS: 500.0000 mg | ORAL_CAPSULE | Freq: Three times a day (TID) | ORAL | 0 refills | Status: AC

## 2021-12-07 MED ORDER — METHOCARBAMOL 500 MG PO TABS: 500.0000 mg | ORAL_TABLET | Freq: Three times a day (TID) | ORAL | Status: DC | PRN

## 2021-12-07 NOTE — NC FL2 (Signed)
[place] LEVEL OF CARE FORM     IDENTIFICATION  Patient Name: Michael Colon Birthdate: August 17, 1930 Sex: male Admission Date (Current Location): 12/04/2021  [facility] and [place] Number:  Engineering geologist and Address:  [facility], [address], [place], [place] [address]      Provider Number: [id number]  Attending Physician Name and Address:  Shelly Coss, MD  Relative Name and Phone Number:  Shanon Brow Laris,[phone]    Current Level of Care: Hospital Recommended Level of Care: [place] Prior Approval Number:    Date Approved/Denied:   PASRR Number: [id number]  Discharge Plan: SNF    Current Diagnoses: Patient Active Problem List   Diagnosis Date Noted   Fall 12/04/2021   Proximal humerus fracture_right 12/04/2021   UTI (urinary tract infection) 12/04/2021   HLD (hyperlipidemia) 12/04/2021   Diabetes mellitus without complication ([place]) 75/64/3329   Stroke ([place]) 12/04/2021   Atrial fibrillation, chronic ([place]) 12/04/2021   Left elbow fracture 11/30/2021   Leg ulcer ([place]) 11/08/2021   Complete heart block ([place]) 09/01/2021   Orchitis of right testicle 11/30/2020   Onychomycosis 11/30/2020   Hematoma 03/06/2020   Chronic diastolic heart failure ([place]) 10/25/2019   Hypercalcemia 10/25/2019   Venous stasis ulcer ([place]) 08/20/2019   Chondrocalcinosis 09/05/2018   Primary osteoarthritis of right knee 08/19/2018   Aortic atherosclerosis ([place]) 12/28/2016   Chronic venous insufficiency 04/12/2016   Thrombocytopenia ([place]) 06/25/2015   Obstructive sleep apnea 06/25/2015   Enlarged prostate without lower urinary tract symptoms (luts) 10/14/2014   Advanced directives, counseling/discussion 12/13/2013   Encounter for therapeutic drug monitoring 01/30/2013   Routine general medical examination at a health care facility 12/11/2012   TIA (transient ischemic attack) 12/11/2012   Ventricular tachycardia-pause  dependent 10/16/2012   Sinus bradycardia 09/07/2012   Bradycardia 09/07/2012   Edema 09/07/2012   Localized edema 09/07/2012   Mixed hyperlipidemia    Abnormal EKG 09/26/2011   Atrial flutter ([place]) 07/21/2010   Encounter for anticoagulation discussion and counseling 07/21/2010   Type 2 diabetes, controlled, with neuropathy (HCC)    Tachy-brady syndrome (HCC)    BPH with obstruction/lower urinary tract symptoms    Unspecified asthma(493.90)    GERD (gastroesophageal reflux disease)    Essential hypertension     Orientation RESPIRATION BLADDER Height & Weight     Self, Time, Situation, Place  Normal External catheter Weight:   Height:  '5\' 10"'$  (177.8 cm)  BEHAVIORAL SYMPTOMS/MOOD NEUROLOGICAL BOWEL NUTRITION STATUS   (n/a)  (n/a) Continent Diet (Low Sodium Heart Healthy)  AMBULATORY STATUS COMMUNICATION OF NEEDS Skin   Limited Assist Verbally Bruising, Skin abrasions (Bilateral face, buttocks- Abrasions to arm, eye, face)                       Personal Care Assistance Level of Assistance  Bathing, Dressing Bathing Assistance: Limited assistance   Dressing Assistance: Limited assistance     Functional Limitations Info             SPECIAL CARE FACTORS FREQUENCY  PT (By licensed PT), OT (By licensed OT)     PT Frequency: Min 2xweekly OT Frequency: Min 2xweekly            Contractures Contractures Info: Not present    Additional Factors Info  Code Status, Allergies Code Status Info: PARTIAL Allergies Info: Lovenox (Enoxaparin), Proscar (Finasteride), Latex, Nickel, Percocet (Oxycodone-acetaminophen), Tape           Current Medications (  12/07/2021):  This is the current hospital active medication list Current Facility-Administered Medications  Medication Dose Route Frequency Provider Last Rate Last Admin   acetaminophen (TYLENOL) tablet 650 mg  650 mg Oral Q6H PRN Mansy, Jan A, MD       Or   acetaminophen (TYLENOL) suppository 650 mg  650 mg Rectal Q6H  PRN Mansy, Jan A, MD       albuterol (PROVENTIL) (2.5 MG/3ML) 0.083% nebulizer solution 3 mL  3 mL Inhalation Q4H PRN Ivor Costa, MD       atorvastatin (LIPITOR) tablet 80 mg  80 mg Oral Daily Mansy, Jan A, MD   80 mg at 12/07/21 0844   bisacodyl (DULCOLAX) EC tablet 5 mg  5 mg Oral BID Shelly Coss, MD   5 mg at 12/07/21 0844   cephALEXin (KEFLEX) capsule 500 mg  500 mg Oral Q8H Adhikari, Amrit, MD   500 mg at 12/07/21 5170   cyanocobalamin (VITAMIN B12) tablet 500 mcg  500 mcg Oral Daily Mansy, Jan A, MD   500 mcg at 12/07/21 0844   dextromethorphan-guaiFENesin ([place] [facility]) 30-600 MG per 12 hr tablet 1 tablet  1 tablet Oral BID PRN Ivor Costa, MD       docusate sodium (COLACE) capsule 100 mg  100 mg Oral BID Mansy, Jan A, MD   100 mg at 12/07/21 0844   furosemide (LASIX) tablet 20 mg  20 mg Oral Daily Ivor Costa, MD   20 mg at 12/07/21 0844   hydrALAZINE (APRESOLINE) injection 5 mg  5 mg Intravenous Q2H PRN Ivor Costa, MD       insulin aspart (novoLOG) injection 0-5 Units  0-5 Units Subcutaneous QHS Ivor Costa, MD   2 Units at 12/06/21 2308   insulin aspart (novoLOG) injection 0-9 Units  0-9 Units Subcutaneous TID WC Ivor Costa, MD   3 Units at 12/07/21 1204   lidocaine (LIDODERM) 5 % 1 patch  1 patch Transdermal Q24H Ivor Costa, MD   1 patch at 12/06/21 0957   loratadine (CLARITIN) tablet 10 mg  10 mg Oral Daily Mansy, Jan A, MD   10 mg at 12/07/21 0844   magnesium hydroxide (MILK OF MAGNESIA) suspension 30 mL  30 mL Oral Daily PRN Mansy, Jan A, MD       magnesium oxide (MAG-OX) tablet 400 mg  400 mg Oral Daily Mansy, Jan A, MD   400 mg at 12/07/21 0844   methocarbamol (ROBAXIN) tablet 500 mg  500 mg Oral Q8H PRN Ivor Costa, MD   500 mg at 12/06/21 0955   morphine (PF) 2 MG/ML injection 1 mg  1 mg Intravenous Q3H PRN Ivor Costa, MD   1 mg at 12/06/21 0955   ondansetron (ZOFRAN) tablet 4 mg  4 mg Oral Q6H PRN Mansy, Jan A, MD       Or   ondansetron [facility]) injection 4 mg  4 mg Intravenous  Q6H PRN Mansy, Jan A, MD       polyvinyl alcohol (LIQUIFILM TEARS) 1.4 % ophthalmic solution   Both Eyes Daily PRN Mansy, Jan A, MD       silver sulfADIAZINE (SILVADENE) 1 % cream 1 Application  1 Application Topical Daily Mansy, Jan A, MD   1 Application at 01/74/94 1719   tamsulosin (FLOMAX) capsule 0.4 mg  0.4 mg Oral Daily Mansy, Jan A, MD   0.4 mg at 12/07/21 0844   traMADol (ULTRAM) tablet 50 mg  50 mg Oral Q6H PRN Shelly Coss,  MD   50 mg at 12/07/21 0844   traZODone (DESYREL) tablet 25 mg  25 mg Oral QHS PRN Mansy, Jan A, MD       triamterene-hydrochlorothiazide [facility]) 37.5-25 MG per tablet 1 tablet  1 tablet Oral q morning Mansy, Jan A, MD   1 tablet at 12/07/21 0844   warfarin (COUMADIN) tablet 7.5 mg  7.5 mg Oral ONCE-1600 Lorin Picket, [facility]       Warfarin - Pharmacist Dosing Inpatient   Does not apply [place], [place], [facility]         Discharge Medications: Please see discharge summary for a list of discharge medications.  Relevant Imaging Results:  Relevant Lab Results:   Additional Information Mariea Clonts RN, BSN, CCM    Patient's SS# [id number]  Laurena Slimmer, RN

## 2021-12-07 NOTE — TOC Progression Note (Addendum)
Transition of Care [facility]) - Progression Note    Patient Details  Name: Michael Colon MRN: [id number] Date of Birth: 01-27-1930  Transition [facility]) CM/SW Contact  Laurena Slimmer, RN Phone Number: 12/07/2021, 9:52 AM  Clinical Narrative:    Per [place] portal auth approved for [facility] from 12/5-12/7 #[id number]. Facility and MD notified.    12:10pm Spoke with patient at the bedside regarding his discharge plasn to [facility]. Patient stated he was told he was refused to go to SNF. Patient assured he as approved. He was advised EMS would transport him. Patient reluctant to go to facility by EMS. He was informed he needed more assistance than what transportation from [facility] could provide. Patient advised his status of Partial Code has to be a full code or DNR for EMS transport only. Patient request to be DNR.  MD notified.   D/C summary, transfer report, and FL2 sent in the hub EMS sheet and medical necessity form printed to the floor to be added to EMS packet.    Nurse will call report to [phone]  Patient assigned to room 107. EMS scheduled.   TOC signing off.            Expected Discharge Plan and Services                                                 Social Determinants of Health (SDOH) Interventions Food Insecurity Interventions: Intervention Not Indicated Housing Interventions: Intervention Not Indicated Transportation Interventions: Intervention Not Indicated Utilities Interventions: Patient Refused  Readmission Risk Interventions     No data to display

## 2021-12-07 NOTE — Progress Notes (Signed)
  Chaplain On-Call received a call from RN Doloris Hall, who reported the patient's condition with each arm in a sling due to injuries, and the patient's request for assistance in using his phone for calling his insurance provider.  Chaplain met the patient, and learned that he is a retired [employer]. He stated that he needs to contact the insurance provider to renew his health care coverage before December 7.  Chaplain used the patient's cell phone to dial the insurance provider, and we learned that there would be a waiting time of 75 minutes to speak with a representative. The patient asked this Chaplain to contact a friend, who in turn was able to contact another friend, who will be able to come visit and stay to assist the patient.  Chaplain provided spiritual and emotional support and prayer.  Chaplain Pollyann Samples M.Div., [facility]

## 2021-12-07 NOTE — Progress Notes (Signed)
Pt d/c'd to facility via EMS. IV removed intact. VSS. Report called to [facility] (See previous note). All belongings sent with pt.

## 2021-12-07 NOTE — Care Management Important Message (Signed)
Important Message  Patient Details  Name: Michael Colon MRN: [id number] Date of Birth: [birth year]   Medicare Important Message Given:  N/A - LOS <3 / Initial given by admissions     Juliann Pulse A Marquis Diles 12/07/2021, 11:25 AM

## 2021-12-07 NOTE — Plan of Care (Signed)

## 2021-12-07 NOTE — Progress Notes (Signed)
Occupational Therapy Treatment Patient Details Name: Michael Colon MRN: [id number] DOB: [birth year] Today's Date: 12/07/2021   History of present illness Patient is a 88 year old male with history of hypertension, hyperlipidemia, diabetes type 2, stroke, asthma, status post pacemaker placement due to complete heart block, BPH, A-fib on Coumadin, recent left ankle injury who presented with fall and right arm pain from retirement  [employer]. 12/03/21 R humerus x-ray: Proximal right humeral fracture involving the greater tuberosity  likely humeral neck. Of note pt has L olecranon fx and L nasal bone fx from 11/10/21, L elbow in cast.   OT comments  Michael Colon was seen for OT treatment on this date. Upon arrival to room pt reclined in bed, agreeable to tx. Pt requires MOD A exit bed, initial MIN A for sitting balance 2/2 R lateral lean. Slings doffed for gentle wrist/elbow ROM exercises, MAX A to don slings in sitting. Pt tolerated 8 sit<>stand from max elevated bed height - initial MAX A x2, improves to MIN A x2. MAX A x2 for lateral weight shifting and 1 step. Pt making good progress toward goals, will continue to follow POC. Discharge recommendation remains appropriate.     Recommendations for follow up therapy are one component of a multi-disciplinary discharge planning process, led by the attending physician.  Recommendations may be updated based on patient status, additional functional criteria and insurance authorization.    Follow Up Recommendations  Skilled nursing-short term rehab (<3 hours/day)     Assistance Recommended at Discharge Frequent or constant Supervision/Assistance  Patient can return home with the following  Two people to help with walking and/or transfers;Two people to help with bathing/dressing/bathroom;Help with stairs or ramp for entrance   Equipment Recommendations  Other (comment) (defer)    Recommendations for Other Services      Precautions / Restrictions  Precautions Precautions: Fall Restrictions Weight Bearing Restrictions: Yes RUE Weight Bearing: Non weight bearing LUE Weight Bearing: Non weight bearing       Mobility Bed Mobility Overal bed mobility: Needs Assistance Bed Mobility: Sit to Supine, Supine to Sit     Supine to sit: Mod assist Sit to supine: Max assist, +2 for physical assistance        Transfers Overall transfer level: Needs assistance   Transfers: Sit to/from Stand Sit to Stand: Mod assist, +2 physical assistance, From elevated surface           General transfer comment: initial MAX A x2 improving to MIN A x2; tolerates 8 standing trials     Balance Overall balance assessment: Needs assistance Sitting-balance support: No upper extremity supported, Feet supported Sitting balance-Leahy Scale: Poor   Postural control: Right lateral lean Standing balance support: No upper extremity supported, During functional activity Standing balance-Leahy Scale: Poor                             ADL either performed or assessed with clinical judgement   ADL Overall ADL's : Needs assistance/impaired                                       General ADL Comments: MOD A don/doff B shoulder slings seated EOB. SETUP self-drinking and phone use using LUE only      Cognition Arousal/Alertness: Awake/alert Behavior During Therapy: WFL for tasks assessed/performed Overall Cognitive Status: Within Functional Limits for tasks assessed  General Comments: fearful of falling                   Pertinent Vitals/ Pain       Pain Assessment Pain Assessment: 0-10 Pain Score: 3  Pain Location: generalized Pain Descriptors / Indicators: Discomfort, Dull Pain Intervention(s): Limited activity within patient's tolerance   Frequency  Min 2X/week        Progress Toward Goals  OT Goals(current goals can now be found in the care plan  section)  Progress towards OT goals: Progressing toward goals  Acute Rehab OT Goals Patient Stated Goal: to go home OT Goal Formulation: With patient Time For Goal Achievement: 12/20/21 Potential to Achieve Goals: Good ADL Goals Pt Will Perform Grooming: with mod assist;sitting Pt Will Perform Lower Body Dressing: with mod assist;sit to/from stand Pt Will Transfer to Toilet: with min assist;stand pivot transfer;bedside commode  Plan Discharge plan remains appropriate;Frequency remains appropriate    Co-evaluation    PT/OT/SLP Co-Evaluation/Treatment: Yes Reason for Co-Treatment: To address functional/ADL transfers PT goals addressed during session: Mobility/safety with mobility;Balance;Proper use of DME;Strengthening/ROM OT goals addressed during session: ADL's and self-care      AM-PAC OT "6 Clicks" Daily Activity     Outcome Measure   Help from another person eating meals?: A Little Help from another person taking care of personal grooming?: A Lot Help from another person toileting, which includes using toliet, bedpan, or urinal?: A Lot Help from another person bathing (including washing, rinsing, drying)?: A Lot Help from another person to put on and taking off regular upper body clothing?: A Lot Help from another person to put on and taking off regular lower body clothing?: A Lot 6 Click Score: 13    End of Session    OT Visit Diagnosis: Other abnormalities of gait and mobility (R26.89);Muscle weakness (generalized) (M62.81)   Activity Tolerance Patient tolerated treatment well   Patient Left in bed;with call bell/phone within reach;with bed alarm set   Nurse Communication Mobility status        Time: 9563-8756 OT Time Calculation (min): 46 min  Charges: OT General Charges $OT Visit: 1 Visit OT Treatments $Self Care/Home Management : 8-22 mins $Therapeutic Activity: 8-22 mins  Dessie Coma, M.S. OTR/L  12/07/21, 1:03 PM  ascom [phone]

## 2021-12-07 NOTE — Progress Notes (Signed)
Report called to [place] at [facility].

## 2021-12-07 NOTE — Discharge Summary (Signed)
Physician Discharge Summary  Michael Colon TMA:[id number] DOB: 1930-10-27 DOA: 12/04/2021  PCP: Venia Carbon, MD  Admit date: 12/04/2021 Discharge date: 12/07/2021  Admitted From: ILF Disposition: SNF Discharge Condition:Stable CODE STATUS:FULL Diet recommendation: Partial  Brief/Interim Summary: Patient is a 88 year old male with history of hypertension, hyperlipidemia, diabetes type 2, stroke, asthma, status post pacemaker placement due to complete heart block, BPH, A-fib on Coumadin, recent left ankle injury who presented with fall and right arm pain from retirement  [employer].  No history of loss of consciousness.  Complained of right upper arm pain which was severe.  Found to have hematoma in the left forehead and bruise around the left eye.  On presentation, UA showed large amount of leukocytes, positive nitrates.  He was hemodynamically stable.  CT head/C-spine negative for acute findings.  CT of the maxillofacial showed subacute left nasal bone fractures but no acute fracture.  X-ray of the right lower showed right proximal humerus fracture.  Orthopedics consulted.  Plan for conservative management and outpatient follow up.  PT/OT consulted,recommending SnF .  Medically stable for discharge to SNF today.  Following problems were addressed during the hospitalization:  ight proximal humerus fracture/fall: X-ray of right shoulder/humerus showed right proximal humerus fracture.  Orthopedics ,Dr Randalyn Rhea.  Recommended conservative management, use of sling for 4 to 6 weeks.  Recommended outpatient follow-up in 4 weeks with repeat chest x-rays as outpatient.  PT/OT consulted,recommended SnF.  Continue pain management, supportive care. Patient recently fractured his left elbow and currently on a cast. Patient is currently residing at retirement community, [email or web address] consulted.   UTI: Unclear if he had urinary symptoms but UA showed leukocytes, positive nitrates.  He  also had mild leukocytosis on presentation which has resolved. Started on ceftriaxone.  Urine culture showed pansensitive E. coli.  Antibiotics changed to oral   Chronic diastolic CHF: 2D echo on 02/08/6387 showed EF of 55 to 37%, grade 2 diastolic dysfunction.  No leg edema.  Mild elevated BNP.  On Lasix as needed at home.  On Lasix 20 mg daily right now   Chronic A-fib: Currently rate is controlled.EKG showed junctional rhythm.  Takes Coumadin.resume on dc   Diabetes type 2: Recent A1c of 7.6.  Takes metformin.  Monitor blood sugars.   Hypertension: On  Dyazide.  Monitor blood pressure.    Hyperlipidemia: On Lipitor   Stroke: Takes Lipitor.  Ambulates with help of walker   BPH: On Proscar and Flomax.  Discharge Diagnoses:  Principal Problem:   Proximal humerus fracture_right Active Problems:   Fall   UTI (urinary tract infection)   Chronic diastolic heart failure (HCC)   Atrial fibrillation, chronic (HCC)   Diabetes mellitus without complication (HCC)   Essential hypertension   HLD (hyperlipidemia)   Stroke ([place])   BPH with obstruction/lower urinary tract symptoms    Discharge Instructions  Discharge Instructions     Diet - low sodium heart healthy   Complete by: As directed    Discharge instructions   Complete by: As directed    1)Please take prescribed medications as instructed 2)Follow up with orthopedics in 2 weeks.  Number of the provider has been attached   Increase activity slowly   Complete by: As directed       Allergies as of 12/07/2021       Reactions   Lovenox [enoxaparin] Hives   Proscar [finasteride] Other (See Comments)   Upper body and arm weakness   Latex Rash   Has trouble  with BANDAIDS that have been left on for more than 24 hours. Prefers paper tape.   Nickel Rash   Localized rash   Percocet [oxycodone-acetaminophen] Rash   Tape Rash, Other (See Comments)   Sensitivity         Medication List     TAKE these medications    APPLE  CIDER VINEGAR PO Take 1 capsule by mouth at bedtime.   atorvastatin 80 MG tablet Commonly known as: LIPITOR Take 1 tablet (80 mg total) by mouth daily.   B-12 PO Place 1 tablet under the tongue daily.   cephALEXin 500 MG capsule Commonly known as: KEFLEX Take 1 capsule (500 mg total) by mouth every 8 (eight) hours for 2 days.   cetirizine 10 MG tablet Commonly known as: ZYRTEC Take 10 mg by mouth daily.   docusate sodium 100 MG capsule Commonly known as: COLACE Take 100 mg by mouth 2 (two) times daily.   finasteride 5 MG tablet Commonly known as: PROSCAR TAKE 1 TABLET BY MOUTH ONCE DAILY   furosemide 20 MG tablet Commonly known as: LASIX Take 1 tablet (20 mg total) by mouth daily. Start taking on: December 08, 2021 What changed:  when to take this reasons to take this additional instructions   ibuprofen 200 MG tablet Commonly known as: ADVIL Take 1 tablet (200 mg total) by mouth every 6 (six) hours as needed. What changed:  when to take this reasons to take this   lidocaine 5 % Commonly known as: LIDODERM Place 1 patch onto the skin daily. Remove & Discard patch within 12 hours or as directed by MD Start taking on: December 08, 2021   loratadine 10 MG tablet Commonly known as: CLARITIN Take 10 mg by mouth daily.   Magnesium Oxide 400 MG Caps Take 1 capsule (400 mg total) by mouth daily.   metFORMIN 500 MG tablet Commonly known as: GLUCOPHAGE TAKE 1 TABLET BY MOUTH  TWICE DAILY WITH MEALS What changed: when to take this   methocarbamol 500 MG tablet Commonly known as: ROBAXIN Take 1 tablet (500 mg total) by mouth every 8 (eight) hours as needed for muscle spasms.   senna 8.6 MG Tabs tablet Commonly known as: SENOKOT Take 1 tablet (8.6 mg total) by mouth daily.   silver sulfADIAZINE 1 % cream Commonly known as: SILVADENE Apply 1 Application topically daily.   SYSTANE ULTRA OP Place 1 drop into both eyes daily as needed (dry eyes).   tamsulosin 0.4  MG Caps capsule Commonly known as: FLOMAX TAKE 1 CAPSULE BY MOUTH  DAILY   traMADol 50 MG tablet Commonly known as: ULTRAM Take 1 tablet (50 mg total) by mouth every 6 (six) hours as needed for moderate pain.   triamterene-hydrochlorothiazide 37.5-25 MG capsule Commonly known as: DYAZIDE TAKE 1 CAPSULE BY MOUTH IN  THE MORNING   warfarin 5 MG tablet Commonly known as: COUMADIN Take as directed. If you are unsure how to take this medication, talk to your nurse or doctor. Original instructions: TAKE 1 TO 1 AND 1/2 TABLETS BY MOUTH DAILY AS DIRECTED  BY COUMADIN CLINIC What changed: See the new instructions.        Follow-up Information     Renee Harder, MD. Schedule an appointment as soon as possible for a visit in 2 week(s).   Specialty: Orthopedic Surgery Contact information: [address] [place] [address] [phone]                Allergies  Allergen Reactions   Lovenox [Enoxaparin] Hives   Proscar [Finasteride] Other (See Comments)    Upper body and arm weakness   Latex Rash    Has trouble with BANDAIDS that have been left on for more than 24 hours. Prefers paper tape.   Nickel Rash    Localized rash   Percocet [Oxycodone-Acetaminophen] Rash   Tape Rash and Other (See Comments)    Sensitivity     Consultations: Orthopedics   Procedures/Studies: CT CERVICAL SPINE WO CONTRAST  Result Date: 12/03/2021 CLINICAL DATA:  Fall on coumadin EXAM: CT CERVICAL SPINE WITHOUT CONTRAST TECHNIQUE: Multidetector CT imaging of the cervical spine was performed without intravenous contrast. Multiplanar CT image reconstructions were also generated. RADIATION DOSE REDUCTION: This exam was performed according to the departmental dose-optimization program which includes automated exposure control, adjustment of the mA and/or kV according to patient size and/or use of iterative reconstruction technique. COMPARISON:  11/10/2021 FINDINGS: Alignment: Normal Skull  base and vertebrae: No acute fracture. No primary bone lesion or focal pathologic process. Soft tissues and spinal canal: No prevertebral fluid or swelling. No visible canal hematoma. Disc levels: Diffuse degenerative disc and facet disease. No visible disc herniation. Upper chest: No acute findings Other: None IMPRESSION: Diffuse degenerative disc and facet disease. No acute bony abnormality. Electronically Signed   By: Rolm Baptise M.D.   On: 12/03/2021 21:20   CT MAXILLOFACIAL WO CONTRAST  Result Date: 12/03/2021 CLINICAL DATA:  Fall, on blood thinners EXAM: CT MAXILLOFACIAL WITHOUT CONTRAST TECHNIQUE: Multidetector CT imaging of the maxillofacial structures was performed. Multiplanar CT image reconstructions were also generated. RADIATION DOSE REDUCTION: This exam was performed according to the departmental dose-optimization program which includes automated exposure control, adjustment of the mA and/or kV according to patient size and/or use of iterative reconstruction technique. COMPARISON:  11/10/2021 FINDINGS: Osseous: Left nasal bone fractures are again noted, unchanged since prior study. Nasal septum is deviated to the left, likely on a chronic basis, stable since prior study. No acute facial fracture. Orbits: Negative. No traumatic or inflammatory finding. Sinuses: Evidence of prior surgery.  No air-fluid levels. Soft tissues: Soft tissue swelling over the left forehead. Limited intracranial: See head CT report IMPRESSION: Left nasal bone fractures are stable since prior study 11/10/2021. No acute facial or orbital fracture. Electronically Signed   By: Rolm Baptise M.D.   On: 12/03/2021 21:18   CT Head Wo Contrast  Result Date: 12/03/2021 CLINICAL DATA:  Fall, on blood thinners EXAM: CT HEAD WITHOUT CONTRAST TECHNIQUE: Contiguous axial images were obtained from the base of the skull through the vertex without intravenous contrast. RADIATION DOSE REDUCTION: This exam was performed according to the  departmental dose-optimization program which includes automated exposure control, adjustment of the mA and/or kV according to patient size and/or use of iterative reconstruction technique. COMPARISON:  11/10/2021 FINDINGS: Brain: There is atrophy and chronic small vessel disease changes. No acute intracranial abnormality. Specifically, no hemorrhage, hydrocephalus, mass lesion, acute infarction, or significant intracranial injury. Vascular: No hyperdense vessel or unexpected calcification. Skull: No acute calvarial abnormality. Sinuses/Orbits: No acute findings Other: Soft tissue swelling over the left forehead. IMPRESSION: Atrophy, chronic microvascular disease. No acute intracranial abnormality. Electronically Signed   By: Rolm Baptise M.D.   On: 12/03/2021 21:16   DG Humerus Right  Result Date: 12/03/2021 CLINICAL DATA:  Fall, right shoulder pain EXAM: RIGHT HUMERUS - 2+ VIEW COMPARISON:  Right shoulder series FINDINGS: Fracture seen in the proximal right humerus involving the greater tuberosity and likely  the humeral neck. No subluxation or dislocation. No additional humeral abnormality. IMPRESSION: Proximal right humeral fracture involving the greater tuberosity likely humeral neck. Electronically Signed   By: Rolm Baptise M.D.   On: 12/03/2021 21:12   DG Shoulder Right  Result Date: 12/03/2021 CLINICAL DATA:  Fall, right shoulder pain EXAM: RIGHT SHOULDER - 2+ VIEW COMPARISON:  None Available. FINDINGS: Fracture noted through the proximal right humerus in the region of the greater tuberosity. Probable humeral neck fracture. No subluxation or dislocation. Degenerative changes in the right AC joint. IMPRESSION: Proximal right humeral fracture involving the greater tuberosity and likely humeral neck. Electronically Signed   By: Rolm Baptise M.D.   On: 12/03/2021 21:11   DG Elbow 2 Views Right  Result Date: 12/03/2021 CLINICAL DATA:  Fall EXAM: RIGHT ELBOW - 2 VIEW COMPARISON:  None Available.  FINDINGS: Degenerative changes in the right elbow with joint space narrowing and spurring. No acute bony abnormality. Specifically, no fracture, subluxation, or dislocation. No joint effusion. IMPRESSION: No acute bony abnormality. Electronically Signed   By: Rolm Baptise M.D.   On: 12/03/2021 21:10   CT HEAD WO CONTRAST (5MM)  Result Date: 11/10/2021 CLINICAL DATA:  Head trauma, minor (Age >= 65y) Pt is on blood thinners; Facial trauma, blunt; Neck trauma (Age >= 65y) Fall EXAM: CT HEAD WITHOUT CONTRAST CT MAXILLOFACIAL WITHOUT CONTRAST CT CERVICAL SPINE WITHOUT CONTRAST TECHNIQUE: Multidetector CT imaging of the head, cervical spine, and maxillofacial structures were performed using the standard protocol without intravenous contrast. Multiplanar CT image reconstructions of the cervical spine and maxillofacial structures were also generated. RADIATION DOSE REDUCTION: This exam was performed according to the departmental dose-optimization program which includes automated exposure control, adjustment of the mA and/or kV according to patient size and/or use of iterative reconstruction technique. COMPARISON:  CT head and CT cervical spine 813, 23 FINDINGS: CT HEAD FINDINGS Brain: No evidence of acute infarction, hemorrhage, hydrocephalus, extra-axial collection or mass lesion/mass effect. Vascular: No hyperdense vessel. Skull: No acute fracture.  Left forehead/periorbital contusion. Other: No mastoid effusions.  Partial right mastoidectomy. CT MAXILLOFACIAL FINDINGS Osseous: Mildly displaced left nasal bone fracture, probably acute and new since the prior. No TMJ dislocation. Left TMJ degenerative change. Orbits: Negative. No traumatic or inflammatory finding. Sinuses: Evidence of prior endoscopic sinus surgery.  Clear sinuses. Soft tissues: Left periorbital/nasal/forehead contusion. CT CERVICAL SPINE FINDINGS Alignment: No substantial sagittal subluxation. Skull base and vertebrae: Vertebral body heights are  maintained. No evidence of acute fracture. Osteopenia. Soft tissues and spinal canal: No prevertebral fluid or swelling. No visible canal hematoma. Disc levels:  Moderate multilevel degenerative change. Upper chest: Visualized lung apices are clear. IMPRESSION: 1. No evidence of acute intracranial abnormality. 2. Probably acute left nasal bone fracture, mildly displaced. Left forehead/nasal/periorbital contusion. 3. No evidence of acute fracture or traumatic malalignment in the cervical spine. Electronically Signed   By: Margaretha Sheffield M.D.   On: 11/10/2021 09:47   CT Cervical Spine Wo Contrast  Result Date: 11/10/2021 CLINICAL DATA:  Head trauma, minor (Age >= 65y) Pt is on blood thinners; Facial trauma, blunt; Neck trauma (Age >= 65y) Fall EXAM: CT HEAD WITHOUT CONTRAST CT MAXILLOFACIAL WITHOUT CONTRAST CT CERVICAL SPINE WITHOUT CONTRAST TECHNIQUE: Multidetector CT imaging of the head, cervical spine, and maxillofacial structures were performed using the standard protocol without intravenous contrast. Multiplanar CT image reconstructions of the cervical spine and maxillofacial structures were also generated. RADIATION DOSE REDUCTION: This exam was performed according to the departmental dose-optimization program which includes  automated exposure control, adjustment of the mA and/or kV according to patient size and/or use of iterative reconstruction technique. COMPARISON:  CT head and CT cervical spine 813, 23 FINDINGS: CT HEAD FINDINGS Brain: No evidence of acute infarction, hemorrhage, hydrocephalus, extra-axial collection or mass lesion/mass effect. Vascular: No hyperdense vessel. Skull: No acute fracture.  Left forehead/periorbital contusion. Other: No mastoid effusions.  Partial right mastoidectomy. CT MAXILLOFACIAL FINDINGS Osseous: Mildly displaced left nasal bone fracture, probably acute and new since the prior. No TMJ dislocation. Left TMJ degenerative change. Orbits: Negative. No traumatic or  inflammatory finding. Sinuses: Evidence of prior endoscopic sinus surgery.  Clear sinuses. Soft tissues: Left periorbital/nasal/forehead contusion. CT CERVICAL SPINE FINDINGS Alignment: No substantial sagittal subluxation. Skull base and vertebrae: Vertebral body heights are maintained. No evidence of acute fracture. Osteopenia. Soft tissues and spinal canal: No prevertebral fluid or swelling. No visible canal hematoma. Disc levels:  Moderate multilevel degenerative change. Upper chest: Visualized lung apices are clear. IMPRESSION: 1. No evidence of acute intracranial abnormality. 2. Probably acute left nasal bone fracture, mildly displaced. Left forehead/nasal/periorbital contusion. 3. No evidence of acute fracture or traumatic malalignment in the cervical spine. Electronically Signed   By: Margaretha Sheffield M.D.   On: 11/10/2021 09:47   CT Maxillofacial Wo Contrast  Result Date: 11/10/2021 CLINICAL DATA:  Head trauma, minor (Age >= 65y) Pt is on blood thinners; Facial trauma, blunt; Neck trauma (Age >= 65y) Fall EXAM: CT HEAD WITHOUT CONTRAST CT MAXILLOFACIAL WITHOUT CONTRAST CT CERVICAL SPINE WITHOUT CONTRAST TECHNIQUE: Multidetector CT imaging of the head, cervical spine, and maxillofacial structures were performed using the standard protocol without intravenous contrast. Multiplanar CT image reconstructions of the cervical spine and maxillofacial structures were also generated. RADIATION DOSE REDUCTION: This exam was performed according to the departmental dose-optimization program which includes automated exposure control, adjustment of the mA and/or kV according to patient size and/or use of iterative reconstruction technique. COMPARISON:  CT head and CT cervical spine 813, 23 FINDINGS: CT HEAD FINDINGS Brain: No evidence of acute infarction, hemorrhage, hydrocephalus, extra-axial collection or mass lesion/mass effect. Vascular: No hyperdense vessel. Skull: No acute fracture.  Left forehead/periorbital  contusion. Other: No mastoid effusions.  Partial right mastoidectomy. CT MAXILLOFACIAL FINDINGS Osseous: Mildly displaced left nasal bone fracture, probably acute and new since the prior. No TMJ dislocation. Left TMJ degenerative change. Orbits: Negative. No traumatic or inflammatory finding. Sinuses: Evidence of prior endoscopic sinus surgery.  Clear sinuses. Soft tissues: Left periorbital/nasal/forehead contusion. CT CERVICAL SPINE FINDINGS Alignment: No substantial sagittal subluxation. Skull base and vertebrae: Vertebral body heights are maintained. No evidence of acute fracture. Osteopenia. Soft tissues and spinal canal: No prevertebral fluid or swelling. No visible canal hematoma. Disc levels:  Moderate multilevel degenerative change. Upper chest: Visualized lung apices are clear. IMPRESSION: 1. No evidence of acute intracranial abnormality. 2. Probably acute left nasal bone fracture, mildly displaced. Left forehead/nasal/periorbital contusion. 3. No evidence of acute fracture or traumatic malalignment in the cervical spine. Electronically Signed   By: Margaretha Sheffield M.D.   On: 11/10/2021 09:47   DG Elbow Complete Left  Result Date: 11/10/2021 CLINICAL DATA:  Pain, status post fall EXAM: LEFT ELBOW - COMPLETE 3+ VIEW; LEFT HAND - COMPLETE 3+ VIEW COMPARISON:  None Available. FINDINGS: Left elbow: No evidence of acute fracture. There is advanced arthritis of the left elbow joint. Nonvisualization of the radial head, which may be sequela of prior trauma or postsurgical changes. Ankylosis of the ulnar trochlear joint with heterotopic ossification. This may  be sequela of prior trauma or infectious/inflammatory process. Left wrist: There is no evidence of fracture, dislocation, or joint effusion. Degenerative changes with heterotopic ossification and cystic changes at distal ulna/distal radioulnar joint with surrounding soft tissue swelling. Mild degenerative changes of the first carpometacarpal joint and  metacarpophalangeal joint. Mild generalized arthritis of interphalangeal joints. Vascular calcifications. IMPRESSION: 1. No acute fracture or dislocation. 2. Advanced arthritis of the left elbow joint as above. 3. Degenerative changes of the hand and wrist. Electronically Signed   By: Keane Police D.O.   On: 11/10/2021 09:21   DG Hand Complete Left  Result Date: 11/10/2021 CLINICAL DATA:  Pain, status post fall EXAM: LEFT ELBOW - COMPLETE 3+ VIEW; LEFT HAND - COMPLETE 3+ VIEW COMPARISON:  None Available. FINDINGS: Left elbow: No evidence of acute fracture. There is advanced arthritis of the left elbow joint. Nonvisualization of the radial head, which may be sequela of prior trauma or postsurgical changes. Ankylosis of the ulnar trochlear joint with heterotopic ossification. This may be sequela of prior trauma or infectious/inflammatory process. Left wrist: There is no evidence of fracture, dislocation, or joint effusion. Degenerative changes with heterotopic ossification and cystic changes at distal ulna/distal radioulnar joint with surrounding soft tissue swelling. Mild degenerative changes of the first carpometacarpal joint and metacarpophalangeal joint. Mild generalized arthritis of interphalangeal joints. Vascular calcifications. IMPRESSION: 1. No acute fracture or dislocation. 2. Advanced arthritis of the left elbow joint as above. 3. Degenerative changes of the hand and wrist. Electronically Signed   By: Keane Police D.O.   On: 11/10/2021 09:21      Subjective: Patient seen and examined at bedside today.  Hemodynamically stable for discharge.  No new complaints.  I called the son and updated about the discharge plan.  Discharge Exam: Vitals:   12/07/21 0420 12/07/21 0754  BP: 118/60 (!) 145/57  Pulse: 60 65  Resp: 18 16  Temp: 98.4 F (36.9 C) 98.7 F (37.1 C)  SpO2: 97% 96%   Vitals:   12/06/21 1533 12/06/21 1900 12/07/21 0420 12/07/21 0754  BP: (!) 118/51 128/65 118/60 (!) 145/57   Pulse: 66 71 60 65  Resp: '18 18 18 16  '$ Temp: 98.6 F (37 C) 97.7 F (36.5 C) 98.4 F (36.9 C) 98.7 F (37.1 C)  TempSrc:  Oral Oral Oral  SpO2: 99% 98% 97% 96%  Height:        General: Pt is alert, awake, not in acute distress, facial bruising Cardiovascular: RRR, S1/S2 +, no rubs, no gallops Respiratory: CTA bilaterally, no wheezing, no rhonchi Abdominal: Soft, NT, ND, bowel sounds + Extremities: no edema, no cyanosis, cast on the left upper extremity, sling on the right upper extremity    The results of significant diagnostics from this hospitalization (including imaging, microbiology, ancillary and laboratory) are listed below for reference.     Microbiology: Recent Results (from the past 240 hour(s))  Urine Culture     Status: Abnormal   Collection Time: 12/03/21 10:53 PM   Specimen: Urine, Random  Result Value Ref Range Status   Specimen Description   Final    URINE, RANDOM Performed at [facility], [address] [place], [place] [address]    Special Requests   Final    NONE Performed at [facility], [address] [place], [place] [address]    Culture >=100,000 COLONIES/mL ESCHERICHIA COLI (A)  Final   Report Status 12/06/2021 FINAL  Final   Organism ID, Bacteria ESCHERICHIA COLI (A)  Final      Susceptibility   Escherichia coli - MIC*    AMPICILLIN >=32 RESISTANT Resistant     CEFAZOLIN <=4 SENSITIVE Sensitive     CEFEPIME <=0.12 SENSITIVE Sensitive     CEFTRIAXONE <=0.25 SENSITIVE Sensitive     CIPROFLOXACIN <=0.25 SENSITIVE Sensitive     GENTAMICIN <=1 SENSITIVE Sensitive     IMIPENEM <=0.25 SENSITIVE Sensitive     NITROFURANTOIN <=16 SENSITIVE Sensitive     TRIMETH/SULFA <=20 SENSITIVE Sensitive     AMPICILLIN/SULBACTAM 16 INTERMEDIATE Intermediate     PIP/TAZO <=4 SENSITIVE Sensitive     * >=100,000 COLONIES/mL ESCHERICHIA COLI     Labs: BNP (last 3 results) Recent Labs    12/04/21 0500  BNP 892.1*   Basic Metabolic  Panel: Recent Labs  Lab 12/04/21 0118 12/04/21 0500 12/06/21 0348  NA 137 136 132*  K 3.7 3.6 3.7  CL 106 105 103  CO2 '23 25 22  '$ GLUCOSE 186* 164* 166*  BUN 26* 25* 26*  CREATININE 0.77 0.79 0.79  CALCIUM 10.5* 9.8 9.8   Liver Function Tests: No results for input(s): "AST", "ALT", "ALKPHOS", "BILITOT", "PROT", "ALBUMIN" in the last 168 hours. No results for input(s): "LIPASE", "AMYLASE" in the last 168 hours. No results for input(s): "AMMONIA" in the last 168 hours. CBC: Recent Labs  Lab 12/04/21 0118 12/04/21 0500 12/06/21 0348 12/07/21 0623  WBC 12.3* 9.5 11.0* 10.6*  NEUTROABS 10.2*  --   --   --   HGB 12.0* 11.0* 10.4* 10.7*  HCT 34.8* 32.9* 30.8* 30.9*  MCV 92.3 93.7 93.1 93.1  PLT 174 157 138* 139*   Cardiac Enzymes: No results for input(s): "CKTOTAL", "CKMB", "CKMBINDEX", "TROPONINI" in the last 168 hours. BNP: Invalid input(s): "POCBNP" CBG: Recent Labs  Lab 12/06/21 1242 12/06/21 1645 12/06/21 2033 12/06/21 2222 12/07/21 0758  GLUCAP 228* 226* 245* 206* 150*   D-Dimer No results for input(s): "DDIMER" in the last 72 hours. Hgb A1c No results for input(s): "HGBA1C" in the last 72 hours. Lipid Profile No results for input(s): "CHOL", "HDL", "LDLCALC", "TRIG", "CHOLHDL", "LDLDIRECT" in the last 72 hours. Thyroid function studies No results for input(s): "TSH", "T4TOTAL", "T3FREE", "THYROIDAB" in the last 72 hours.  Invalid input(s): "FREET3" Anemia work up No results for input(s): "VITAMINB12", "FOLATE", "FERRITIN", "TIBC", "IRON", "RETICCTPCT" in the last 72 hours. Urinalysis    Component Value Date/Time   COLORURINE YELLOW (A) 12/03/2021 2253   APPEARANCEUR CLOUDY (A) 12/03/2021 2253   APPEARANCEUR Cloudy (A) 01/14/2016 1536   LABSPEC 1.014 12/03/2021 2253   LABSPEC 1.018 09/08/2013 1854   PHURINE 6.0 12/03/2021 2253   GLUCOSEU NEGATIVE 12/03/2021 2253   GLUCOSEU NEGATIVE 11/30/2020 1554   HGBUR NEGATIVE 12/03/2021 2253   BILIRUBINUR  NEGATIVE 12/03/2021 2253   BILIRUBINUR Negative 01/14/2016 1536   BILIRUBINUR Negative 09/08/2013 1854   KETONESUR 5 (A) 12/03/2021 2253   PROTEINUR NEGATIVE 12/03/2021 2253   UROBILINOGEN 2.0 (A) 11/30/2020 1554   NITRITE POSITIVE (A) 12/03/2021 2253   LEUKOCYTESUR LARGE (A) 12/03/2021 2253   LEUKOCYTESUR 1+ 09/08/2013 1854   Sepsis Labs Recent Labs  Lab 12/04/21 0118 12/04/21 0500 12/06/21 0348 12/07/21 0623  WBC 12.3* 9.5 11.0* 10.6*   Microbiology Recent Results (from the past 240 hour(s))  Urine Culture     Status: Abnormal   Collection Time: 12/03/21 10:53 PM   Specimen: Urine, Random  Result Value Ref Range Status   Specimen Description   Final    URINE, RANDOM Performed at [facility]  [facility], [address] [place], [place] [address]    Special Requests   Final    NONE Performed at [facility], [address] [place], [place] [address]    Culture >=100,000 COLONIES/mL ESCHERICHIA COLI (A)  Final   Report Status 12/06/2021 FINAL  Final   Organism ID, Bacteria ESCHERICHIA COLI (A)  Final      Susceptibility   Escherichia coli - MIC*    AMPICILLIN >=32 RESISTANT Resistant     CEFAZOLIN <=4 SENSITIVE Sensitive     CEFEPIME <=0.12 SENSITIVE Sensitive     CEFTRIAXONE <=0.25 SENSITIVE Sensitive     CIPROFLOXACIN <=0.25 SENSITIVE Sensitive     GENTAMICIN <=1 SENSITIVE Sensitive     IMIPENEM <=0.25 SENSITIVE Sensitive     NITROFURANTOIN <=16 SENSITIVE Sensitive     TRIMETH/SULFA <=20 SENSITIVE Sensitive     AMPICILLIN/SULBACTAM 16 INTERMEDIATE Intermediate     PIP/TAZO <=4 SENSITIVE Sensitive     * >=100,000 COLONIES/mL ESCHERICHIA COLI    Please note: You were cared for by a hospitalist during your hospital stay. Once you are discharged, your primary care physician will handle any further medical issues. Please note that NO REFILLS for any discharge medications will be authorized once you are discharged, as it is imperative that you return to your  primary care physician (or establish a relationship with a primary care physician if you do not have one) for your post hospital discharge needs so that they can reassess your need for medications and monitor your lab values.    Time coordinating discharge: 40 minutes  SIGNED:   Shelly Coss, MD  [facility] 12/07/2021, 11:10 AM Pager [phone]  If 7PM-7AM, please contact night-coverage [email or web address] Password [email or web address]

## 2021-12-07 NOTE — Progress Notes (Addendum)
Physical Therapy Treatment Patient Details Name: Michael Colon MRN: [id number] DOB: 1930/06/18 Today's Date: 12/07/2021   History of Present Illness Patient is a 88 year old male with history of hypertension, hyperlipidemia, diabetes type 2, stroke, asthma, status post pacemaker placement due to complete heart block, BPH, A-fib on Coumadin, recent left ankle injury who presented with fall and right arm pain from retirement  [employer]. 12/03/21 R humerus x-ray: Proximal right humeral fracture involving the greater tuberosity  likely humeral neck. Of note pt has L olecranon fx and L nasal bone fx from 11/10/21, L elbow in cast.    PT Comments    Pt found EOB with OT upon PT entry seen as a co-treat to maximize function and safety. Sit<>Stand ranging from max assist x2 and progressing to min assist x2. Pt required heavy education for sit to stand mechanics and was able to demonstrate understanding. Pt anxious with standing, and tolerated seated level exercises well with cues. Pt would benefit from skilled physical therapy to address the listed deficits (see below) to increase independence with ADLs and function. Current recommendation remains appropriate.      Recommendations for follow up therapy are one component of a multi-disciplinary discharge planning process, led by the attending physician.  Recommendations may be updated based on patient status, additional functional criteria and insurance authorization.  Follow Up Recommendations  Skilled nursing-short term rehab (<3 hours/day) Can patient physically be transported by private vehicle: No   Assistance Recommended at Discharge Frequent or constant Supervision/Assistance  Patient can return home with the following A lot of help with walking and/or transfers;A lot of help with bathing/dressing/bathroom;Assistance with cooking/housework;Assist for transportation;Help with stairs or ramp for entrance   Equipment Recommendations  None  recommended by PT    Recommendations for Other Services       Precautions / Restrictions Precautions Precautions: Fall Restrictions Weight Bearing Restrictions: Yes RUE Weight Bearing: Non weight bearing LUE Weight Bearing: Non weight bearing     Mobility  Bed Mobility Overal bed mobility: Needs Assistance Bed Mobility: Sit to Supine  maxAx2              Transfers Overall transfer level: Needs assistance   Transfers: Sit to/from Stand Sit to Stand: Mod assist, +2 physical assistance, From elevated surface, Min assist, Max assist                Ambulation/Gait                   Stairs             Wheelchair Mobility    Modified Rankin (Stroke Patients Only)       Balance Overall balance assessment: Needs assistance Sitting-balance support: No upper extremity supported, Feet supported Sitting balance-Leahy Scale: Fair     Standing balance support: No upper extremity supported, During functional activity Standing balance-Leahy Scale: Poor                              Cognition Arousal/Alertness: Awake/alert Behavior During Therapy: WFL for tasks assessed/performed Overall Cognitive Status: Within Functional Limits for tasks assessed                                 General Comments: pt unable to locate where his pain is        Exercises General Exercises - Lower Extremity Quad Sets:  AROM, Both, Seated, 10 reps Hip Flexion/Marching: AROM, Both, 10 reps, Seated Other Exercises Other Exercises: bilateral weight shifts    General Comments        Pertinent Vitals/Pain Pain Assessment Pain Score: 3  Pain Location: global pain Pain Descriptors / Indicators: Dull, Grimacing, Aching, Constant Pain Intervention(s): Limited activity within patient's tolerance, Monitored during session    Home Living                          Prior Function            PT Goals (current goals can now be  found in the care plan section) Acute Rehab PT Goals Patient Stated Goal: to return home PT Goal Formulation: With patient Time For Goal Achievement: 12/20/21 Potential to Achieve Goals: Good Progress towards PT goals: Progressing toward goals    Frequency    Min 2X/week      PT Plan Current plan remains appropriate    Co-evaluation PT/OT/SLP Co-Evaluation/Treatment: Yes   PT goals addressed during session: Mobility/safety with mobility;Balance;Proper use of DME;Strengthening/ROM OT goals addressed during session: ADL's and self-care      AM-PAC PT "6 Clicks" Mobility   Outcome Measure  Help needed turning from your back to your side while in a flat bed without using bedrails?: A Little Help needed moving from lying on your back to sitting on the side of a flat bed without using bedrails?: A Little Help needed moving to and from a bed to a chair (including a wheelchair)?: A Little Help needed standing up from a chair using your arms (e.g., wheelchair or bedside chair)?: A Lot Help needed to walk in hospital room?: Total Help needed climbing 3-5 steps with a railing? : Total 6 Click Score: 13    End of Session Equipment Utilized During Treatment: Gait belt Activity Tolerance: Patient tolerated treatment well Patient left: in bed;with bed alarm set;with call bell/phone within reach Nurse Communication: Mobility status PT Visit Diagnosis: Unsteadiness on feet (R26.81);Other abnormalities of gait and mobility (R26.89);Repeated falls (R29.6);Muscle weakness (generalized) (M62.81);History of falling (Z91.81);Difficulty in walking, not elsewhere classified (R26.2)     Time: 8588-5027 PT Time Calculation (min) (ACUTE ONLY): 29 min  Charges:                        Aletha Halim, SPT  12/07/2021, 12:46 PM

## 2021-12-07 NOTE — Consult Note (Signed)
[facility] for Warfarin Indication: atrial fibrillation  Allergies  Allergen Reactions   Lovenox [Enoxaparin] Hives   Proscar [Finasteride] Other (See Comments)    Upper body and arm weakness   Latex Rash    Has trouble with BANDAIDS that have been left on for more than 24 hours. Prefers paper tape.   Nickel Rash    Localized rash   Percocet [Oxycodone-Acetaminophen] Rash   Tape Rash and Other (See Comments)    Sensitivity     Patient Measurements: Height: '5\' 10"'$  (177.8 cm) IBW/kg (Calculated) : 73 Heparin Dosing Weight: 91.4 kg  Vital Signs: Temp: 98.7 F (37.1 C) (12/05 0754) Temp Source: Oral (12/05 0754) BP: 145/57 (12/05 0754) Pulse Rate: 65 (12/05 0754)  Labs: Recent Labs    12/05/21 1118 12/06/21 0348 12/07/21 0623  HGB  --  10.4* 10.7*  HCT  --  30.8* 30.9*  PLT  --  138* 139*  LABPROT 38.0* 31.3* 25.0*  INR 3.9* 3.1* 2.3*  CREATININE  --  0.79  --      Estimated Creatinine Clearance: 71.2 mL/min (by C-G formula based on SCr of 0.79 mg/dL).   Medical History: Past Medical History:  Diagnosis Date   Asthma    Atrial flutter ([place]) 2007   Band keratopathy    Benign prostatic hypertrophy    Chronic ear infection    Chronic kidney disease    Chronic prostatitis    Dental bridge present    permanent - upper   Diabetes mellitus    Elbow stiffness, left    s/p fracture many yrs ago.  arm does not straighten   GERD (gastroesophageal reflux disease)    laryngeal involvement   Hyperlipidemia    Hypertension    Obstructive sleep apnea    CPAP-9   Osteoarthrosis, localized, primary, knee    post-traumatic   Prostatitis    Stroke ([place])    TIA   Tachy-brady syndrome ([place])    UTI (urinary tract infection)     Medications:  PTA: warfarin '5mg'$  MWFSa, 7.'5mg'$  SunTuTh Last dose: patient unsure but knows it was sometime before 12/02/21  Assessment: Pharmacy has been consulted to reinitiate warfarin in 88yo male  with history of atrial fibrillation who presented to the ED on 12/02 after an unwitnessed fall. Patient injured his head and right arm, no loss of consciousness, however. X-ray of right shoulder/humerus showed right proximal humerus fracture. Orthopedics consulted, recommended conservative management and cleared to resume warfarin  Date INR Dose 12/3 3.9 hold 12/4 3.1 Warfarin 2.5 mg po x 1 12/5 2.3 Warfarin 7.5 mg po x 1   Goal of Therapy:  INR 2-3 Monitor platelets by anticoagulation protocol: Yes   Plan:  INR currently therapeutic Will give Warfarin 7.5 mg po x 1 INR/CBC daily  Lorin Picket, PharmD 12/07/2021,8:54 AM

## 2021-12-08 ENCOUNTER — Ambulatory Visit: Payer: Medicare Other | Attending: Cardiovascular Disease

## 2021-12-08 ENCOUNTER — Non-Acute Institutional Stay (SKILLED_NURSING_FACILITY): Payer: Medicare Other | Admitting: Student

## 2021-12-08 ENCOUNTER — Encounter: Payer: Medicare Other | Admitting: Cardiology

## 2021-12-08 ENCOUNTER — Encounter: Payer: Self-pay | Admitting: Student

## 2021-12-08 DIAGNOSIS — W19XXXA Unspecified fall, initial encounter: Secondary | ICD-10-CM | POA: Diagnosis not present

## 2021-12-08 DIAGNOSIS — S42402D Unspecified fracture of lower end of left humerus, subsequent encounter for fracture with routine healing: Secondary | ICD-10-CM

## 2021-12-08 DIAGNOSIS — I10 Essential (primary) hypertension: Secondary | ICD-10-CM

## 2021-12-08 DIAGNOSIS — I482 Chronic atrial fibrillation, unspecified: Secondary | ICD-10-CM

## 2021-12-08 DIAGNOSIS — E1140 Type 2 diabetes mellitus with diabetic neuropathy, unspecified: Secondary | ICD-10-CM

## 2021-12-08 DIAGNOSIS — D696 Thrombocytopenia, unspecified: Secondary | ICD-10-CM

## 2021-12-08 DIAGNOSIS — N3001 Acute cystitis with hematuria: Secondary | ICD-10-CM

## 2021-12-08 DIAGNOSIS — S42209A Unspecified fracture of upper end of unspecified humerus, initial encounter for closed fracture: Secondary | ICD-10-CM | POA: Diagnosis not present

## 2021-12-08 DIAGNOSIS — I442 Atrioventricular block, complete: Secondary | ICD-10-CM | POA: Diagnosis not present

## 2021-12-08 DIAGNOSIS — I4892 Unspecified atrial flutter: Secondary | ICD-10-CM

## 2021-12-08 DIAGNOSIS — I5032 Chronic diastolic (congestive) heart failure: Secondary | ICD-10-CM | POA: Diagnosis not present

## 2021-12-08 DIAGNOSIS — I700 Atherosclerosis of aorta: Secondary | ICD-10-CM

## 2021-12-08 DIAGNOSIS — K219 Gastro-esophageal reflux disease without esophagitis: Secondary | ICD-10-CM

## 2021-12-08 DIAGNOSIS — I495 Sick sinus syndrome: Secondary | ICD-10-CM

## 2021-12-08 DIAGNOSIS — T148XXA Other injury of unspecified body region, initial encounter: Secondary | ICD-10-CM | POA: Diagnosis not present

## 2021-12-08 NOTE — Progress Notes (Signed)
Provider:  Dr. Dewayne Shorter Location:  Other [place].  Nursing Home Room Number: [place] [address]A Place of Service:  SNF (31)  PCP: Dewayne Shorter, MD Patient Care Team: Dewayne Shorter, MD as PCP - General (Family Medicine) Minna Merritts, MD as PCP - Cardiology (Cardiology) Minna Merritts, MD as Consulting Physician (Cardiology)  Extended Emergency Contact Information Primary Emergency Contact: [place] Phone: [phone] Relation: Son  Code Status: DNR Goals of Care: Advanced Directive information    12/08/2021    3:38 PM  Advanced Directives  Does Patient Have a Medical Advance Directive? Yes  Type of Advance Directive [place]  Does patient want to make changes to medical advance directive? No - Patient declined  Copy of [place] in Chart? Yes - validated most recent copy scanned in chart (See row information)      Chief Complaint  Patient presents with   New Admit To SNF    Admission    HPI: Patient is a 88 y.o. male seen today for admission to [facility].   [place] a [place] at [facility]. He has been with Dr. Silvio Pate.   He had a fall  on 11/10/2021 he had an 8am appointment at [facility] cardiology. He was in a hazardous part of the sidewalk, and it was a patched area. Hi tripped and fell on his face and skinned it up. Went to his cardiology appointment and then he went to the ED. At that time he didn't have any bleeding or complication sin his head, but he did break his nose -- brusing everywhere. He went to orthopedist and found that the left elbow had been broken into the bone. And he has been in soft cast since then.   12/03/2021 he was coming home and he couldn't push the storm door far enouh becuas ehe had a sling - his foot caught on the storm door and he fell into the entrance of his home. He landed on the right elbow and found that he fx his humerous. Neither  break indicated surgical intervention. When he fell he was in an awkward position and couldn't move. Neighbor helped him get EMT to go to the ED.   He lives alone. Wife passed in 2020. [place] catheter.    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 Mostly worked with physicians.   Pace maker 08/31/21  Pain is well-controlled.    Past Medical History:  Diagnosis Date   Asthma    Atrial flutter ([place]) 2007   Band keratopathy    Benign prostatic hypertrophy    Chronic ear infection    Chronic kidney disease    Chronic prostatitis    Dental bridge present    permanent - upper   Diabetes mellitus    Elbow stiffness, left    s/p fracture many yrs ago.  arm does not straighten   GERD (gastroesophageal reflux disease)    laryngeal involvement   Hyperlipidemia    Hypertension    Obstructive sleep apnea    CPAP-9   Osteoarthrosis, localized, primary, knee    post-traumatic   Prostatitis    Stroke [facility])    TIA   Tachy-brady syndrome ([place])    UTI (urinary tract infection)    Past Surgical History:  Procedure Laterality Date   APPENDECTOMY     BELPHAROPTOSIS REPAIR     Dr  [name]---didn't resolve weepy eye and eyelid drooping   CARDIOVERSION  04/13/2010   CATARACT EXTRACTION, BILATERAL  2009   Chest pain  8/12   Stress test benign   ESOPHAGEAL DILATION  05/26/2016   Procedure: ESOPHAGEAL DILATION;  Surgeon: Lucilla Lame, MD;  Location: [place];  Service: Endoscopy;;   ESOPHAGOGASTRODUODENOSCOPY (EGD) WITH PROPOFOL N/A 05/26/2016   Procedure: ESOPHAGOGASTRODUODENOSCOPY (EGD) WITH PROPOFOL;  Surgeon: Lucilla Lame, MD;  Location: [place];  Service: Endoscopy;  Laterality: N/A;  Diabetic - oral meds sleep apnea   EYE SURGERY     FRACTURE SURGERY Left    elbow   KNEE SURGERY  1998   plate after fracture, then removed for infection   left elbow surgery     MASTOIDECTOMY  8/08   Dr  Idelle Crouch   PACEMAKER IMPLANT N/A 09/02/2021   Procedure: PACEMAKER IMPLANT;  Surgeon: Vickie Epley, MD;  Location: [place] CV LAB;  Service: Cardiovascular;  Laterality: N/A;   RHINOPLASTY  5/10   and septoplasty   SUBACROMIAL DECOMPRESSION Right 2005   Arthroscopic (for rotator cuff and biceps tendon ruptures)   TEAR DUCT PROBING  11/13   Dr Vickki Muff   TOTAL KNEE ARTHROPLASTY Left 09/01/2014   Procedure: TOTAL KNEE ARTHROPLASTY;  Surgeon: Dereck Leep, MD;  Location: [facility] ORS;  Service: Orthopedics;  Laterality: Left;   TRIGGER FINGER RELEASE Left 02/25/2015   Procedure: LEFT LONG TRIGGER RELEASE;  Surgeon: Dereck Leep, MD;  Location: [facility] ORS;  Service: Orthopedics;  Laterality: Left;   VENOUS ABLATION      reports that he quit smoking about 52 years ago. His smoking use included cigarettes. He smoked an average of 1.00 packs per day. He has never been exposed to tobacco smoke. He has never used smokeless tobacco. He reports that he does not drink alcohol and does not use drugs. Social History   Socioeconomic History   Marital status: Widowed    Spouse name: Not on file   Number of children: 2   Years of education: Not on file   Highest education level: Not on file  Occupational History   Occupation: Pastor    Comment: Retired--[employer]  Tobacco Use   Smoking status: Former    Packs/day: 1.00    Years: 0.00    Total pack years: 0.00    Types: Cigarettes    Quit date: 01/03/1969    Years since quitting: 52.9    Passive exposure: Never   Smokeless tobacco: Never  Vaping Use   Vaping Use: Never used  Substance and Sexual Activity   Alcohol use: No    Alcohol/week: 1.0 standard drink of alcohol    Types: 1 Glasses of wine per week    Comment: rare wine. 1-2x/yr.   Drug use: No   Sexual activity: Not Currently  Other Topics Concern   Not on file  Social History Narrative   Has living will   Health care POA-- son Shanon Brow   Has DNR order from the past---form  redone 06/25/10   Probably no feeding tube if cognitively unaware   Social Determinants of Health   Financial Resource Strain: Not on file  Food Insecurity: Unknown (12/05/2021)   Hunger Vital Sign    Worried About Running Out of Food in the Last Year: Patient refused    [place] in the Last Year: Patient refused  Transportation Needs: No Transportation Needs (12/05/2021)   PRAPARE - Hydrologist (  Medical): No    Lack of Transportation (Non-Medical): No  Physical Activity: Not on file  Stress: Not on file  Social Connections: Not on file  Intimate Partner Violence: Not At Risk (12/05/2021)   Humiliation, Afraid, Rape, and Kick questionnaire    Fear of Current or Ex-Partner: No    Emotionally Abused: No    Physically Abused: No    Sexually Abused: No    Functional Status Survey:    Family History  Problem Relation Age of Onset   Colon cancer Father    Diabetes Father    Hypertension Father    Other Mother        natural causes   Heart attack Neg Hx    Stroke Neg Hx     Health Maintenance  Topic Date Due   Zoster Vaccines- Shingrix (1 of 2) Never done   HEMOGLOBIN A1C  08/25/2021   COVID-19 Vaccine (4 - 2023-24 season) 09/03/2021   Medicare Annual Wellness (AWV)  02/25/2022   OPHTHALMOLOGY EXAM  04/17/2022   FOOT EXAM  08/20/2022   DTaP/Tdap/Td (3 - Td or Tdap) 08/16/2031   Pneumonia Vaccine 24+ Years old  Completed   INFLUENZA VACCINE  Completed   HPV VACCINES  Aged Out    Allergies  Allergen Reactions   Lovenox [Enoxaparin] Hives   Proscar [Finasteride] Other (See Comments)    Upper body and arm weakness   Latex Rash    Has trouble with BANDAIDS that have been left on for more than 24 hours. Prefers paper tape.   Nickel Rash    Localized rash   Percocet [Oxycodone-Acetaminophen] Rash   Tape Rash and Other (See Comments)    Sensitivity     Outpatient Encounter Medications as of 12/08/2021  Medication Sig   APPLE CIDER  VINEGAR PO Take 1 capsule by mouth at bedtime.   atorvastatin (LIPITOR) 80 MG tablet Take 1 tablet (80 mg total) by mouth daily.   cephALEXin (KEFLEX) 500 MG capsule Take 1 capsule (500 mg total) by mouth every 8 (eight) hours for 2 days.   cetirizine (ZYRTEC) 5 MG tablet Take 5 mg by mouth daily.   Cyanocobalamin (B-12 PO) Place 1 tablet under the tongue daily.   docusate sodium (COLACE) 100 MG capsule Take 100 mg by mouth 2 (two) times daily.   finasteride (PROSCAR) 5 MG tablet TAKE 1 TABLET BY MOUTH ONCE DAILY   furosemide (LASIX) 20 MG tablet Take 1 tablet (20 mg total) by mouth daily.   ibuprofen (ADVIL) 200 MG tablet Take 1 tablet (200 mg total) by mouth every 6 (six) hours as needed.   lidocaine (LIDODERM) 5 % Place 1 patch onto the skin daily. Remove & Discard patch within 12 hours or as directed by MD   loratadine (CLARITIN) 10 MG tablet Take 10 mg by mouth daily.   Magnesium Oxide 400 MG CAPS Take 1 capsule (400 mg total) by mouth daily.   metFORMIN (GLUCOPHAGE) 500 MG tablet TAKE 1 TABLET BY MOUTH  TWICE DAILY WITH MEALS   methocarbamol (ROBAXIN) 500 MG tablet Take 1 tablet (500 mg total) by mouth every 8 (eight) hours as needed for muscle spasms.   Polyethyl Glycol-Propyl Glycol (SYSTANE ULTRA OP) Place 1 drop into both eyes daily as needed (dry eyes).   senna (SENOKOT) 8.6 MG TABS tablet Take 1 tablet (8.6 mg total) by mouth daily.   silver sulfADIAZINE (SILVADENE) 1 % cream Apply 1 Application topically daily.   tamsulosin (FLOMAX) 0.4 MG CAPS  capsule TAKE 1 CAPSULE BY MOUTH  DAILY   traMADol (ULTRAM) 50 MG tablet Take 1 tablet (50 mg total) by mouth every 6 (six) hours as needed for moderate pain.   triamterene-hydrochlorothiazide (DYAZIDE) 37.5-25 MG capsule TAKE 1 CAPSULE BY MOUTH IN  THE MORNING   warfarin (COUMADIN) 5 MG tablet TAKE 1 TO 1 AND 1/2 TABLETS BY MOUTH DAILY AS DIRECTED  BY COUMADIN CLINIC (Patient taking differently: Take 5-7.5 mg by mouth See admin instructions.  7.5 mg at bedtime on Sunday, Tuesday, Thursday 5 mg at bedtime on Monday, Wednesday, Friday, Saturday)   [DISCONTINUED] cetirizine (ZYRTEC) 10 MG tablet Take 10 mg by mouth daily.   No facility-administered encounter medications on file as of 12/08/2021.    Review of Systems  All other systems reviewed and are negative.   Vitals:   12/08/21 1528  BP: (!) 146/78  Pulse: 68  Resp: 18  Temp: 99.5 F (37.5 C)  SpO2: 96%  Weight: 194 lb 3.2 oz (88.1 kg)  Height: '5\' 10"'$  (1.778 m)   Body mass index is 27.86 kg/m. Physical Exam HENT:     Head:     Comments: Bilateral bruising and swelling around the eyes, scalp hematoma of the forehead.     Mouth/Throat:     Mouth: Mucous membranes are dry.  Cardiovascular:     Rate and Rhythm: Normal rate.     Pulses: Normal pulses.     Heart sounds: Normal heart sounds.  Pulmonary:     Effort: Pulmonary effort is normal.     Breath sounds: Normal breath sounds.  Abdominal:     General: Bowel sounds are normal.     Palpations: Abdomen is soft.  Musculoskeletal:     Comments: Left arm in cast to mid-humerous. Right arm with 2+ edema and large ecchymosis of the tricep  Skin:    General: Skin is warm.  Neurological:     General: No focal deficit present.     Mental Status: He is alert and oriented to person, place, and time. Mental status is at baseline.  Psychiatric:        Mood and Affect: Mood normal.        Behavior: Behavior normal.        Thought Content: Thought content normal.        Judgment: Judgment normal.     Labs reviewed: Basic Metabolic Panel: Recent Labs    09/02/21 0311 09/03/21 0457 09/16/21 0949 12/04/21 0118 12/04/21 0500 12/06/21 0348  NA 138 137  --  137 136 132*  K 3.6 3.9  --  3.7 3.6 3.7  CL 109 105  --  106 105 103  CO2 25 25  --  '23 25 22  '$ GLUCOSE 125* 112*  --  186* 164* 166*  BUN 26* 22  --  26* 25* 26*  CREATININE 0.91 0.92  --  0.77 0.79 0.79  CALCIUM 10.0 10.5*  --  10.5* 9.8 9.8  MG 1.5*  1.6* 1.6  --   --   --    Liver Function Tests: Recent Labs    02/25/21 1507  AST 15  ALT 11  ALKPHOS 66  BILITOT 0.8  PROT 6.9  ALBUMIN 3.8   No results for input(s): "LIPASE", "AMYLASE" in the last 8760 hours. No results for input(s): "AMMONIA" in the last 8760 hours. CBC: Recent Labs    09/01/21 1139 12/04/21 0118 12/04/21 0500 12/06/21 0348 12/07/21 0623  WBC 7.4 12.3* 9.5 11.0* 10.6*  NEUTROABS 5.0 10.2*  --   --   --   HGB 14.0 12.0* 11.0* 10.4* 10.7*  HCT 41.7 34.8* 32.9* 30.8* 30.9*  MCV 94.3 92.3 93.7 93.1 93.1  PLT 134* 174 157 138* 139*   Cardiac Enzymes: No results for input(s): "CKTOTAL", "CKMB", "CKMBINDEX", "TROPONINI" in the last 8760 hours. BNP: Invalid input(s): "POCBNP" Lab Results  Component Value Date   HGBA1C 7.6 (H) 02/25/2021   Lab Results  Component Value Date   TSH 0.76 12/11/2012   No results found for: "VITAMINB12" No results found for: "FOLATE" No results found for: "IRON", "TIBC", "FERRITIN"  Imaging and Procedures obtained prior to SNF admission: No results found.  Assessment/Plan 1. Closed fracture of proximal end of humerus, unspecified fracture morphology, unspecified laterality, initial encounter DOI 12/03/21 Patient's pain is well-controlled on current regimen. Discussed concern for use of iburpofen at his age, he already had understanding of the risk of bleeding. Plan to discontinue ibuprofen. Continue Tyelenol, tramadol, and methocarbamol at this time. Cntinue lidocaine patch. Continue colace, senna, and miralax for bowel regimen given decreased activity.   2. Closed fracture of left elbow with routine healing, subsequent encounter Followed by orthopedic surgery given DOI on 11/10/2021. Continue pain medications as outlined above. PT/OT as tolerated.   3. Fall, initial encounter Fall on 11/8 and 12/1. PT/OT   4. Hematoma Secondary to fall outlined above. No interventions at this time, continue to monitor.   5. Essential  hypertension BP slightly elevated today, continue to monitor. Goal of 135/80 given DM and cardiac hx. Continue Dyazide 37.5-25 mg daily for BP control.   6. Tachy-brady syndrome ([place])   7. Atrial flutter, unspecified type ([place]) 8. Complete heart block ([place]) 9. Atrial fibrillation, chronic ([place]) 10. Chronic diastolic heart failure ([place]) 11. Aortic atherosclerosis ([place]) Patient with significant cardiovascular history. No symptoms at this time. Pacemaker in place. Continue atorvastatin 80 mg daily. Continue Warfarin at home dose 5 mg Monday, Wednesday, Friday, Saturday. 7.5 mg Sunday, Tuesday, Thursday. INR with next set of labs. Continue magnesium oxide supplementation 400 mg daily. Continue lasix 20 mg daily.   12. Gastroesophageal reflux disease without esophagitis Patient did not endorse symptoms at this time. CTM  13. Type 2 diabetes, controlled, with neuropathy (HCC) A1c well-controlled at 7.6 earlier this year with metformin 500 mg BID. Continue.   14. Acute cystitis with hematuria Continue keflex 500 mg q8 hours for 2 additioanl days. Continue flomax and finasteride.   15. Thrombocytopenia ([place]) Stable at 139 yesterday. Continue to monitor.   16. BPH Continue finasteride and flomax   Family/ staff Communication: Nursing, unable to contact son  Labs/tests ordered: CBC and BMP 1 week

## 2021-12-13 ENCOUNTER — Ambulatory Visit (INDEPENDENT_AMBULATORY_CARE_PROVIDER_SITE_OTHER): Payer: Medicare Other

## 2021-12-13 DIAGNOSIS — I442 Atrioventricular block, complete: Secondary | ICD-10-CM | POA: Diagnosis not present

## 2021-12-14 LAB — CUP PACEART REMOTE DEVICE CHECK
Battery Remaining Longevity: 126 mo
Battery Remaining Percentage: 100 %
Brady Statistic RA Percent Paced: 47 %
Brady Statistic RV Percent Paced: 97 %
Date Time Interrogation Session: 20231211000100
Implantable Lead Connection Status: 753985
Implantable Lead Connection Status: 753985
Implantable Lead Implant Date: 20230831
Implantable Lead Implant Date: 20230831
Implantable Lead Location: 753859
Implantable Lead Location: 753860
Implantable Lead Model: 7841
Implantable Lead Model: 7842
Implantable Lead Serial Number: 1220926
Implantable Lead Serial Number: 1322603
Implantable Pulse Generator Implant Date: 20230831
Lead Channel Impedance Value: 547 Ohm
Lead Channel Impedance Value: 561 Ohm
Lead Channel Pacing Threshold Amplitude: 0.5 V
Lead Channel Pacing Threshold Amplitude: 1.2 V
Lead Channel Pacing Threshold Pulse Width: 0.4 ms
Lead Channel Pacing Threshold Pulse Width: 0.4 ms
Lead Channel Setting Pacing Amplitude: 3.5 V
Lead Channel Setting Pacing Amplitude: 3.5 V
Lead Channel Setting Pacing Pulse Width: 0.4 ms
Lead Channel Setting Sensing Sensitivity: 3.5 mV
Pulse Gen Serial Number: 613820
Zone Setting Status: 755011

## 2021-12-20 DIAGNOSIS — S42201A Unspecified fracture of upper end of right humerus, initial encounter for closed fracture: Secondary | ICD-10-CM | POA: Diagnosis not present

## 2021-12-22 ENCOUNTER — Ambulatory Visit: Payer: Medicare Other | Admitting: Urology

## 2021-12-22 ENCOUNTER — Encounter: Payer: Self-pay | Admitting: Urology

## 2021-12-22 VITALS — Temp 98.4°F

## 2021-12-22 DIAGNOSIS — R3989 Other symptoms and signs involving the genitourinary system: Secondary | ICD-10-CM | POA: Diagnosis not present

## 2021-12-22 DIAGNOSIS — N401 Enlarged prostate with lower urinary tract symptoms: Secondary | ICD-10-CM

## 2021-12-22 DIAGNOSIS — N390 Urinary tract infection, site not specified: Secondary | ICD-10-CM

## 2021-12-22 DIAGNOSIS — N138 Other obstructive and reflux uropathy: Secondary | ICD-10-CM | POA: Diagnosis not present

## 2021-12-22 MED ORDER — FINASTERIDE 5 MG PO TABS: 5.0000 mg | ORAL_TABLET | Freq: Every day | ORAL | 3 refills | Status: AC

## 2021-12-22 MED ORDER — TAMSULOSIN HCL 0.4 MG PO CAPS: 0.8000 mg | ORAL_CAPSULE | Freq: Every day | ORAL | 3 refills | Status: DC

## 2021-12-22 MED ORDER — CEPHALEXIN 500 MG PO CAPS: 500.0000 mg | ORAL_CAPSULE | Freq: Three times a day (TID) | ORAL | 0 refills | Status: AC

## 2021-12-22 NOTE — Progress Notes (Signed)
   12/22/2021 9:22 AM   Michael Colon January 12, 1930 [id number]  Reason for visit: Follow up BPH, history of epididymitis  HPI: 88 year old extremely comorbid male who resides in a facility who I have followed previously for BPH on maximal medical therapy with Flomax and finasteride, and history of epididymitis/prostatitis.  At our last visit in February 2023 PVR was normal at 148 mL, and he was overall stable from a urinary perspective with some occasional frequency during the day and dribbling, but nocturia only 1 time overnight.  We previously had discussed alternatives including cystoscopy/TRUS for further evaluation of outlet procedures, but he deferred.  He has had multiple hospitalizations for falls recently, and I reviewed the recent hospital notes with discharge 12/07/2021.  He denied any specific urinary symptoms at that time, but was found to have an E. coli UTI.  It looks like he was only treated with 5 days of Keflex.  He denies any dysuria today but reports some persistent frequency and dribbling.  He is in a wheelchair and appears quite frail.  He was unable to get out of the wheelchair to void or check PVR today.  I recommended extending his antibiotic course(keflex) to 2 weeks total for complicated UTI in a male Flomax increased to 0.8 mg nightly, continue finasteride for maximal medical therapy Start cranberry tablets twice daily to help with infection prevention RTC 3 to 4 months when more mobile for PVR to confirm emptying appropriately  Billey Co, MD  [facility] [address] [place], [place] [address] [phone]

## 2021-12-23 ENCOUNTER — Telehealth: Payer: Self-pay | Admitting: Family

## 2021-12-23 DIAGNOSIS — S42291D Other displaced fracture of upper end of right humerus, subsequent encounter for fracture with routine healing: Secondary | ICD-10-CM | POA: Diagnosis not present

## 2021-12-23 DIAGNOSIS — S52022D Displaced fracture of olecranon process without intraarticular extension of left ulna, subsequent encounter for closed fracture with routine healing: Secondary | ICD-10-CM | POA: Diagnosis not present

## 2021-12-23 NOTE — Telephone Encounter (Signed)
Facility nurse called to notify provider that patient complaining of inability to void.  No abdominal pain.  Bladder scan ordered.  Labs returned phone calls states 200 mL noted on bladder scan.  No bladder distention noted.  Vital signs stable.  Nurse advised to monitor intake and output.  Bladder scan every shift x 48 hours.  Please follow-up

## 2021-12-24 ENCOUNTER — Encounter: Payer: Self-pay | Admitting: Student

## 2021-12-24 ENCOUNTER — Non-Acute Institutional Stay (SKILLED_NURSING_FACILITY): Payer: Medicare Other | Admitting: Student

## 2021-12-24 DIAGNOSIS — Z711 Person with feared health complaint in whom no diagnosis is made: Secondary | ICD-10-CM | POA: Diagnosis not present

## 2021-12-24 NOTE — Progress Notes (Unsigned)
Location:  Other [facility] Room Number: [place] [address]A Place of Service:  SNF [phone]) Provider:  Dr. Amada Kingfisher, MD  Patient Care Team: Dewayne Shorter, MD as PCP - General (Family Medicine) Rockey Situ Kathlene November, MD as PCP - Cardiology (Cardiology) Minna Merritts, MD as Consulting Physician (Cardiology)  Extended Emergency Contact Information Primary Emergency Contact: [place] Phone: [phone] Relation: Son  Code Status:  DNR Goals of care: Advanced Directive information    12/24/2021    1:54 PM  Advanced Directives  Does Patient Have a Medical Advance Directive? Yes  Type of Paramedic of [place];Living will;Out of facility DNR (pink MOST or yellow form)  Does patient want to make changes to medical advance directive? No - Patient declined  Copy of [place] in Chart? Yes - validated most recent copy scanned in chart (See row information)     Chief Complaint  Patient presents with   Acute Visit    Urinary Retention.     HPI:  Pt is a 88 y.o. male seen today for an acute visit for concern for urinary retention.  Patient stated last evening that he had gone without urination.  Bladder scan for 200, postvoid residual 75.  Continued to urinate throughout the evening.  Patient's only concern at this time is that he is not driving the food as it is not the same as when he is at home.  Patient had cast removed from left arm yesterday.  Right continues to stay in sling.   Past Medical History:  Diagnosis Date   Asthma    Atrial flutter ([place]) 2007   Band keratopathy    Benign prostatic hypertrophy    Chronic ear infection    Chronic kidney disease    Chronic prostatitis    Dental bridge present    permanent - upper   Diabetes mellitus    Elbow stiffness, left    s/p fracture many yrs ago.  arm does not straighten   GERD (gastroesophageal reflux disease)    laryngeal  involvement   Hyperlipidemia    Hypertension    Obstructive sleep apnea    CPAP-9   Osteoarthrosis, localized, primary, knee    post-traumatic   Prostatitis    Stroke ([place])    TIA   Tachy-brady syndrome ([place])    UTI (urinary tract infection)    Past Surgical History:  Procedure Laterality Date   APPENDECTOMY     BELPHAROPTOSIS REPAIR     Dr [name]---didn't resolve weepy eye and eyelid drooping   CARDIOVERSION  04/13/2010   CATARACT EXTRACTION, BILATERAL  2009   Chest pain  8/12   Stress test benign   ESOPHAGEAL DILATION  05/26/2016   Procedure: ESOPHAGEAL DILATION;  Surgeon: Lucilla Lame, MD;  Location: [facility];  Service: Endoscopy;;   ESOPHAGOGASTRODUODENOSCOPY (EGD) WITH PROPOFOL N/A 05/26/2016   Procedure: ESOPHAGOGASTRODUODENOSCOPY (EGD) WITH PROPOFOL;  Surgeon: Lucilla Lame, MD;  Location: [facility];  Service: Endoscopy;  Laterality: N/A;  Diabetic - oral meds sleep apnea   EYE SURGERY     FRACTURE SURGERY Left    elbow   KNEE SURGERY  1998   plate after fracture, then removed for infection   left elbow surgery     MASTOIDECTOMY  8/08   Dr Idelle Crouch   PACEMAKER IMPLANT N/A 09/02/2021   Procedure: PACEMAKER IMPLANT;  Surgeon: Vickie Epley, MD;  Location: [facility];  Service: Cardiovascular;  Laterality: N/A;   RHINOPLASTY  5/10   and septoplasty   SUBACROMIAL DECOMPRESSION Right 2005   Arthroscopic (for rotator cuff and biceps tendon ruptures)   TEAR DUCT PROBING  11/13   Dr Vickki Muff   TOTAL KNEE ARTHROPLASTY Left 09/01/2014   Procedure: TOTAL KNEE ARTHROPLASTY;  Surgeon: Dereck Leep, MD;  Location: [facility];  Service: Orthopedics;  Laterality: Left;   TRIGGER FINGER RELEASE Left 02/25/2015   Procedure: LEFT LONG TRIGGER RELEASE;  Surgeon: Dereck Leep, MD;  Location: [facility];  Service: Orthopedics;  Laterality: Left;   VENOUS ABLATION      Allergies  Allergen Reactions   Lovenox [Enoxaparin] Hives   Proscar [Finasteride]  Other (See Comments)    Upper body and arm weakness   Latex Rash    Has trouble with BANDAIDS that have been left on for more than 24 hours. Prefers paper tape.   Nickel Rash    Localized rash   Percocet [Oxycodone-Acetaminophen] Rash   Tape Rash and Other (See Comments)    Sensitivity     Outpatient Encounter Medications as of 12/24/2021  Medication Sig   acetaminophen (TYLENOL) 500 MG tablet Take 1,000 mg by mouth every 8 (eight) hours as needed.   APPLE CIDER VINEGAR PO Take 1 capsule by mouth at bedtime.   atorvastatin (LIPITOR) 80 MG tablet Take 1 tablet (80 mg total) by mouth daily.   cephALEXin (KEFLEX) 500 MG capsule Take 1 capsule (500 mg total) by mouth 3 (three) times daily for 10 days.   cetirizine (ZYRTEC) 5 MG tablet Take 5 mg by mouth daily.   Cranberry 250 MG TABS Take 1 tablet by mouth 2 (two) times daily.   Cyanocobalamin (B-12 PO) Place 1 tablet under the tongue daily.   docusate sodium (COLACE) 100 MG capsule Take 100 mg by mouth 2 (two) times daily.   finasteride (PROSCAR) 5 MG tablet Take 1 tablet (5 mg total) by mouth daily.   lidocaine (LIDODERM) 5 % Place 1 patch onto the skin daily. Remove & Discard patch within 12 hours or as directed by MD   Magnesium Oxide 400 MG CAPS Take 1 capsule (400 mg total) by mouth daily.   metFORMIN (GLUCOPHAGE) 500 MG tablet TAKE 1 TABLET BY MOUTH  TWICE DAILY WITH MEALS   methocarbamol (ROBAXIN) 500 MG tablet Take 1 tablet (500 mg total) by mouth every 8 (eight) hours as needed for muscle spasms.   Polyethyl Glycol-Propyl Glycol (SYSTANE ULTRA OP) Place 1 drop into both eyes daily as needed (dry eyes).   senna (SENOKOT) 8.6 MG TABS tablet Take 1 tablet (8.6 mg total) by mouth daily.   silver sulfADIAZINE (SILVADENE) 1 % cream Apply 1 Application topically daily.   tamsulosin (FLOMAX) 0.4 MG CAPS capsule Take 2 capsules (0.8 mg total) by mouth at bedtime.   traMADol (ULTRAM) 50 MG tablet Take 1 tablet (50 mg total) by mouth every 6  (six) hours as needed for moderate pain.   triamterene-hydrochlorothiazide (DYAZIDE) 37.5-25 MG capsule TAKE 1 CAPSULE BY MOUTH IN  THE MORNING   warfarin (COUMADIN) 7.5 MG tablet Take one tablet by mouth ine the evening Mon,Wed,Fri,Sun. On HOLD from 12/23/21-12/28/21   [DISCONTINUED] furosemide (LASIX) 20 MG tablet Take 1 tablet (20 mg total) by mouth daily.   [DISCONTINUED] warfarin (COUMADIN) 5 MG tablet TAKE 1 TO 1 AND 1/2 TABLETS BY MOUTH DAILY AS DIRECTED  BY COUMADIN CLINIC (Patient taking differently: Take 5-7.5 mg by mouth See admin instructions. 7.5 mg  at bedtime on 'Sunday, Tuesday, Thursday 5 mg at bedtime on Monday, Wednesday, Friday, Saturday)   No facility-administered encounter medications on file as of 12/24/2021.    Review of Systems  All other systems reviewed and are negative.   Immunization History  Administered Date(s) Administered   Fluad Quad(high Dose 65+) 09/14/2018   H1N1 12/13/2007   Influenza Split 09/11/2010   Influenza Whole 09/12/2011   Influenza, High Dose Seasonal PF 09/20/2012, 09/07/2017   Influenza, Seasonal, Injecte, Preservative Fre 09/08/2007, 09/26/2008, 10/02/2014, 09/18/2015   Influenza,inj,Quad PF,6+ Mos 09/19/2013, 09/21/2015   Influenza-Unspecified 09/07/2017, 09/11/2019, 09/03/2020, 10/03/2021   Moderna Sars-Covid-2 Vaccination 01/18/2019, 02/15/2019   Pneumococcal Conjugate-13 09/19/2013   Pneumococcal Polysaccharide-23 09/26/2003, 06/28/2016   Tdap 06/04/2010, 08/15/2021   Tetanus 08/04/1998   Unspecified SARS-COV-2 Vaccination 06/01/2021   Zoster, Live 01/16/2004   Pertinent  Health Maintenance Due  Topic Date Due   HEMOGLOBIN A1C  08/25/2021   OPHTHALMOLOGY EXAM  04/17/2022   FOOT EXAM  08/20/2022   INFLUENZA VACCINE  Completed      12'$ /03/2021    8:00 AM 12/05/2021   10:38 PM 12/06/2021    7:05 AM 12/06/2021    7:40 PM 12/07/2021    8:45 AM  Fall Risk  Patient Fall Risk Level High fall risk Moderate fall risk High fall risk  High fall risk High fall risk   Functional Status Survey:    Vitals:   12/24/21 1251  BP: 132/60  Pulse: (!) 104  Resp: 18  Temp: 98.3 F (36.8 C)  SpO2: 97%  Weight: 192 lb (87.1 kg)  Height: '5\' 10"'$  (1.778 m)   Body mass index is 27.55 kg/m. Physical Exam Vitals reviewed.  HENT:     Head:     Comments: Face with healing ecchymoses of bilateral eyes. Cardiovascular:     Rate and Rhythm: Normal rate and regular rhythm.     Pulses: Normal pulses.     Heart sounds: Normal heart sounds.  Pulmonary:     Effort: Pulmonary effort is normal.     Breath sounds: Normal breath sounds.  Abdominal:     General: Abdomen is flat. Bowel sounds are normal.     Palpations: Abdomen is soft.  Neurological:     Mental Status: He is alert and oriented to person, place, and time.     Labs reviewed: Recent Labs    09/02/21 0311 09/03/21 0457 09/16/21 0949 12/04/21 0118 12/04/21 0500 12/06/21 0348  NA 138 137  --  137 136 132*  K 3.6 3.9  --  3.7 3.6 3.7  CL 109 105  --  106 105 103  CO2 25 25  --  '23 25 22  '$ GLUCOSE 125* 112*  --  186* 164* 166*  BUN 26* 22  --  26* 25* 26*  CREATININE 0.91 0.92  --  0.77 0.79 0.79  CALCIUM 10.0 10.5*  --  10.5* 9.8 9.8  MG 1.5* 1.6* 1.6  --   --   --    Recent Labs    02/25/21 1507  AST 15  ALT 11  ALKPHOS 66  BILITOT 0.8  PROT 6.9  ALBUMIN 3.8   Recent Labs    09/01/21 1139 12/04/21 0118 12/04/21 0500 12/06/21 0348 12/07/21 0623  WBC 7.4 12.3* 9.5 11.0* 10.6*  NEUTROABS 5.0 10.2*  --   --   --   HGB 14.0 12.0* 11.0* 10.4* 10.7*  HCT 41.7 34.8* 32.9* 30.8* 30.9*  MCV 94.3 92.3 93.7 93.1 93.1  PLT 134* 174 157 138* 139*   Lab Results  Component Value Date   TSH 0.76 12/11/2012   Lab Results  Component Value Date   HGBA1C 7.6 (H) 02/25/2021   Lab Results  Component Value Date   CHOL 131 02/25/2021   HDL 70.00 02/25/2021   LDLCALC 47 02/25/2021   TRIG 67.0 02/25/2021   CHOLHDL 2 02/25/2021    Significant  Diagnostic Results in last 30 days:  CUP PACEART REMOTE DEVICE CHECK  Result Date: 12/14/2021 Scheduled remote reviewed. Normal device function.  Next remote 91 days. LA  CT CERVICAL SPINE WO CONTRAST  Result Date: 12/03/2021 CLINICAL DATA:  Fall on coumadin EXAM: CT CERVICAL SPINE WITHOUT CONTRAST TECHNIQUE: Multidetector CT imaging of the cervical spine was performed without intravenous contrast. Multiplanar CT image reconstructions were also generated. RADIATION DOSE REDUCTION: This exam was performed according to the departmental dose-optimization program which includes automated exposure control, adjustment of the mA and/or kV according to patient size and/or use of iterative reconstruction technique. COMPARISON:  11/10/2021 FINDINGS: Alignment: Normal Skull base and vertebrae: No acute fracture. No primary bone lesion or focal pathologic process. Soft tissues and spinal canal: No prevertebral fluid or swelling. No visible canal hematoma. Disc levels: Diffuse degenerative disc and facet disease. No visible disc herniation. Upper chest: No acute findings Other: None IMPRESSION: Diffuse degenerative disc and facet disease. No acute bony abnormality. Electronically Signed   By: Rolm Baptise M.D.   On: 12/03/2021 21:20   CT MAXILLOFACIAL WO CONTRAST  Result Date: 12/03/2021 CLINICAL DATA:  Fall, on blood thinners EXAM: CT MAXILLOFACIAL WITHOUT CONTRAST TECHNIQUE: Multidetector CT imaging of the maxillofacial structures was performed. Multiplanar CT image reconstructions were also generated. RADIATION DOSE REDUCTION: This exam was performed according to the departmental dose-optimization program which includes automated exposure control, adjustment of the mA and/or kV according to patient size and/or use of iterative reconstruction technique. COMPARISON:  11/10/2021 FINDINGS: Osseous: Left nasal bone fractures are again noted, unchanged since prior study. Nasal septum is deviated to the left, likely on a  chronic basis, stable since prior study. No acute facial fracture. Orbits: Negative. No traumatic or inflammatory finding. Sinuses: Evidence of prior surgery.  No air-fluid levels. Soft tissues: Soft tissue swelling over the left forehead. Limited intracranial: See head CT report IMPRESSION: Left nasal bone fractures are stable since prior study 11/10/2021. No acute facial or orbital fracture. Electronically Signed   By: Rolm Baptise M.D.   On: 12/03/2021 21:18   CT Head Wo Contrast  Result Date: 12/03/2021 CLINICAL DATA:  Fall, on blood thinners EXAM: CT HEAD WITHOUT CONTRAST TECHNIQUE: Contiguous axial images were obtained from the base of the skull through the vertex without intravenous contrast. RADIATION DOSE REDUCTION: This exam was performed according to the departmental dose-optimization program which includes automated exposure control, adjustment of the mA and/or kV according to patient size and/or use of iterative reconstruction technique. COMPARISON:  11/10/2021 FINDINGS: Brain: There is atrophy and chronic small vessel disease changes. No acute intracranial abnormality. Specifically, no hemorrhage, hydrocephalus, mass lesion, acute infarction, or significant intracranial injury. Vascular: No hyperdense vessel or unexpected calcification. Skull: No acute calvarial abnormality. Sinuses/Orbits: No acute findings Other: Soft tissue swelling over the left forehead. IMPRESSION: Atrophy, chronic microvascular disease. No acute intracranial abnormality. Electronically Signed   By: Rolm Baptise M.D.   On: 12/03/2021 21:16   DG Humerus Right  Result Date: 12/03/2021 CLINICAL DATA:  Fall, right shoulder pain EXAM: RIGHT HUMERUS - 2+ VIEW COMPARISON:  Right shoulder series FINDINGS: Fracture seen in the proximal right humerus involving the greater tuberosity and likely the humeral neck. No subluxation or dislocation. No additional humeral abnormality. IMPRESSION: Proximal right humeral fracture involving  the greater tuberosity likely humeral neck. Electronically Signed   By: Rolm Baptise M.D.   On: 12/03/2021 21:12   DG Shoulder Right  Result Date: 12/03/2021 CLINICAL DATA:  Fall, right shoulder pain EXAM: RIGHT SHOULDER - 2+ VIEW COMPARISON:  None Available. FINDINGS: Fracture noted through the proximal right humerus in the region of the greater tuberosity. Probable humeral neck fracture. No subluxation or dislocation. Degenerative changes in the right AC joint. IMPRESSION: Proximal right humeral fracture involving the greater tuberosity and likely humeral neck. Electronically Signed   By: Rolm Baptise M.D.   On: 12/03/2021 21:11   DG Elbow 2 Views Right  Result Date: 12/03/2021 CLINICAL DATA:  Fall EXAM: RIGHT ELBOW - 2 VIEW COMPARISON:  None Available. FINDINGS: Degenerative changes in the right elbow with joint space narrowing and spurring. No acute bony abnormality. Specifically, no fracture, subluxation, or dislocation. No joint effusion. IMPRESSION: No acute bony abnormality. Electronically Signed   By: Rolm Baptise M.D.   On: 12/03/2021 21:10    Assessment/Plan 1. Worried well Overnight provider sent message for concern of urinary retention.  Since last evening patient has urinated multiple times with a normal postvoid residual.  Will continue to monitor with bladder scans for the next 24 hours.  Follow-up as indicated   Family/ staff Communication: Nursing  Labs/tests ordered: None

## 2021-12-28 ENCOUNTER — Encounter: Payer: Self-pay | Admitting: Adult Health

## 2021-12-28 ENCOUNTER — Non-Acute Institutional Stay (SKILLED_NURSING_FACILITY): Payer: Medicare Other | Admitting: Adult Health

## 2021-12-28 DIAGNOSIS — Z9181 History of falling: Secondary | ICD-10-CM | POA: Diagnosis not present

## 2021-12-28 DIAGNOSIS — I495 Sick sinus syndrome: Secondary | ICD-10-CM | POA: Diagnosis not present

## 2021-12-28 DIAGNOSIS — M19122 Post-traumatic osteoarthritis, left elbow: Secondary | ICD-10-CM | POA: Diagnosis not present

## 2021-12-28 DIAGNOSIS — Z7901 Long term (current) use of anticoagulants: Secondary | ICD-10-CM

## 2021-12-28 DIAGNOSIS — R262 Difficulty in walking, not elsewhere classified: Secondary | ICD-10-CM | POA: Diagnosis not present

## 2021-12-28 DIAGNOSIS — S42201D Unspecified fracture of upper end of right humerus, subsequent encounter for fracture with routine healing: Secondary | ICD-10-CM | POA: Diagnosis not present

## 2021-12-28 DIAGNOSIS — R2681 Unsteadiness on feet: Secondary | ICD-10-CM | POA: Diagnosis not present

## 2021-12-28 DIAGNOSIS — D696 Thrombocytopenia, unspecified: Secondary | ICD-10-CM | POA: Diagnosis not present

## 2021-12-28 DIAGNOSIS — S42402D Unspecified fracture of lower end of left humerus, subsequent encounter for fracture with routine healing: Secondary | ICD-10-CM | POA: Diagnosis not present

## 2021-12-28 DIAGNOSIS — R2689 Other abnormalities of gait and mobility: Secondary | ICD-10-CM | POA: Diagnosis not present

## 2021-12-28 DIAGNOSIS — I442 Atrioventricular block, complete: Secondary | ICD-10-CM | POA: Diagnosis not present

## 2021-12-28 DIAGNOSIS — E1140 Type 2 diabetes mellitus with diabetic neuropathy, unspecified: Secondary | ICD-10-CM | POA: Diagnosis not present

## 2021-12-28 DIAGNOSIS — Z950 Presence of cardiac pacemaker: Secondary | ICD-10-CM | POA: Diagnosis not present

## 2021-12-28 DIAGNOSIS — I4891 Unspecified atrial fibrillation: Secondary | ICD-10-CM | POA: Diagnosis not present

## 2021-12-28 DIAGNOSIS — I482 Chronic atrial fibrillation, unspecified: Secondary | ICD-10-CM | POA: Diagnosis not present

## 2021-12-28 DIAGNOSIS — R278 Other lack of coordination: Secondary | ICD-10-CM | POA: Diagnosis not present

## 2021-12-28 DIAGNOSIS — M7022 Olecranon bursitis, left elbow: Secondary | ICD-10-CM | POA: Diagnosis not present

## 2021-12-28 DIAGNOSIS — M6281 Muscle weakness (generalized): Secondary | ICD-10-CM | POA: Diagnosis not present

## 2021-12-28 NOTE — Progress Notes (Signed)
Location:   [place] Room Number: 107 A Place of Service:  SNF [phone]) Provider:  Durenda Age, DNP  Dewayne Shorter, MD  Patient Care Team: Dewayne Shorter, MD as PCP - General (Family Medicine) Minna Merritts, MD as PCP - Cardiology (Cardiology) Minna Merritts, MD as Consulting Physician (Cardiology)  Extended Emergency Contact Information Primary Emergency Contact: Calvin Phone: [phone] Relation: Son  Code Status:  DNR Goals of care: Advanced Directive information    12/28/2021   11:03 AM  Advanced Directives  Does Patient Have a Medical Advance Directive? Yes  Type of Paramedic of [place];Living will;Out of facility DNR (pink MOST or yellow form)  Does patient want to make changes to medical advance directive? No - Patient declined  Copy of [place] in Chart? Yes - validated most recent copy scanned in chart (See row information)  Pre-existing out of facility DNR order (yellow form or pink MOST form) Yellow form placed in chart (order not valid for inpatient use)     Chief Complaint  Patient presents with   Acute Visit    Coumadin therapy    HPI:  Pt is a 88 y.o. male seen today for an acute visit for Coumadin therapy. He takes Coumadin 7.5 mg daily on Sun, Tues and Thurs and Coumadin 5 mg daily on MWF Sat. INR goal 2.5 to 3.5. He takes Coumadin for anticoagulation for atrial fibrillation. INR on 12/21/21 was 7.3. Coumadin has been on hold for a week now. Today, INR was 3.6.    Past Medical History:  Diagnosis Date   Asthma    Atrial flutter ([place]) 2007   Band keratopathy    Benign prostatic hypertrophy    Chronic ear infection    Chronic kidney disease    Chronic prostatitis    Dental bridge present    permanent - upper   Diabetes mellitus    Elbow stiffness, left    s/p fracture many yrs ago.  arm does not straighten   GERD  (gastroesophageal reflux disease)    laryngeal involvement   Hyperlipidemia    Hypertension    Obstructive sleep apnea    CPAP-9   Osteoarthrosis, localized, primary, knee    post-traumatic   Prostatitis    Stroke ([place])    TIA   Tachy-brady syndrome ([place])    UTI (urinary tract infection)    Past Surgical History:  Procedure Laterality Date   APPENDECTOMY     BELPHAROPTOSIS REPAIR     Dr [name]---didn't resolve weepy eye and eyelid drooping   CARDIOVERSION  04/13/2010   CATARACT EXTRACTION, BILATERAL  2009   Chest pain  8/12   Stress test benign   ESOPHAGEAL DILATION  05/26/2016   Procedure: ESOPHAGEAL DILATION;  Surgeon: Lucilla Lame, MD;  Location: [facility] [facility];  Service: Endoscopy;;   ESOPHAGOGASTRODUODENOSCOPY (EGD) WITH PROPOFOL N/A 05/26/2016   Procedure: ESOPHAGOGASTRODUODENOSCOPY (EGD) WITH PROPOFOL;  Surgeon: Lucilla Lame, MD;  Location: [facility];  Service: Endoscopy;  Laterality: N/A;  Diabetic - oral meds sleep apnea   EYE SURGERY     FRACTURE SURGERY Left    elbow   KNEE SURGERY  1998   plate after fracture, then removed for infection   left elbow surgery     MASTOIDECTOMY  8/08   Dr Idelle Crouch   PACEMAKER IMPLANT N/A 09/02/2021   Procedure: PACEMAKER IMPLANT;  Surgeon: Vickie Epley, MD;  Location: [facility];  Service: Cardiovascular;  Laterality: N/A;   RHINOPLASTY  5/10   and septoplasty   SUBACROMIAL DECOMPRESSION Right 2005   Arthroscopic (for rotator cuff and biceps tendon ruptures)   TEAR DUCT PROBING  11/13   Dr Vickki Muff   TOTAL KNEE ARTHROPLASTY Left 09/01/2014   Procedure: TOTAL KNEE ARTHROPLASTY;  Surgeon: Dereck Leep, MD;  Location: [facility];  Service: Orthopedics;  Laterality: Left;   TRIGGER FINGER RELEASE Left 02/25/2015   Procedure: LEFT LONG TRIGGER RELEASE;  Surgeon: Dereck Leep, MD;  Location: [facility];  Service: Orthopedics;  Laterality: Left;   VENOUS ABLATION      Allergies  Allergen Reactions   Lovenox  [Enoxaparin] Hives   Proscar [Finasteride] Other (See Comments)    Upper body and arm weakness   Latex Rash    Has trouble with BANDAIDS that have been left on for more than 24 hours. Prefers paper tape.   Nickel Rash    Localized rash   Percocet [Oxycodone-Acetaminophen] Rash   Tape Rash and Other (See Comments)    Sensitivity     Allergies as of 12/28/2021       Reactions   Lovenox [enoxaparin] Hives   Proscar [finasteride] Other (See Comments)   Upper body and arm weakness   Latex Rash   Has trouble with BANDAIDS that have been left on for more than 24 hours. Prefers paper tape.   Nickel Rash   Localized rash   Percocet [oxycodone-acetaminophen] Rash   Tape Rash, Other (See Comments)   Sensitivity         Medication List        Accurate as of December 28, 2021 11:13 AM. If you have any questions, ask your nurse or doctor.          acetaminophen 500 MG tablet Commonly known as: TYLENOL Take 1,000 mg by mouth every 8 (eight) hours as needed.   APPLE CIDER VINEGAR PO Take 1 capsule by mouth at bedtime.   atorvastatin 80 MG tablet Commonly known as: LIPITOR Take 1 tablet (80 mg total) by mouth daily.   B-12 PO Place 1 tablet under the tongue daily.   cephALEXin 500 MG capsule Commonly known as: Keflex Take 1 capsule (500 mg total) by mouth 3 (three) times daily for 10 days.   cetirizine 5 MG tablet Commonly known as: ZYRTEC Take 5 mg by mouth daily.   Cranberry 250 MG Tabs Take 1 tablet by mouth 2 (two) times daily.   docusate sodium 100 MG capsule Commonly known as: COLACE Take 100 mg by mouth 2 (two) times daily.   finasteride 5 MG tablet Commonly known as: PROSCAR Take 1 tablet (5 mg total) by mouth daily.   lidocaine 5 % Commonly known as: LIDODERM Place 1 patch onto the skin daily. Remove & Discard patch within 12 hours or as directed by MD   Magnesium Oxide 400 MG Caps Take 1 capsule (400 mg total) by mouth daily.   metFORMIN 500 MG  tablet Commonly known as: GLUCOPHAGE TAKE 1 TABLET BY MOUTH  TWICE DAILY WITH MEALS   methocarbamol 500 MG tablet Commonly known as: ROBAXIN Take 1 tablet (500 mg total) by mouth every 8 (eight) hours as needed for muscle spasms.   senna 8.6 MG Tabs tablet Commonly known as: SENOKOT Take 1 tablet (8.6 mg total) by mouth daily.   silver sulfADIAZINE 1 % cream Commonly known as: SILVADENE Apply 1 Application topically daily.   SYSTANE ULTRA OP Place 1  drop into both eyes daily as needed (dry eyes).   tamsulosin 0.4 MG Caps capsule Commonly known as: FLOMAX Take 2 capsules (0.8 mg total) by mouth at bedtime.   traMADol 50 MG tablet Commonly known as: ULTRAM Take 1 tablet (50 mg total) by mouth every 6 (six) hours as needed for moderate pain.   triamterene-hydrochlorothiazide 37.5-25 MG capsule Commonly known as: DYAZIDE TAKE 1 CAPSULE BY MOUTH IN  THE MORNING   warfarin 7.5 MG tablet Commonly known as: COUMADIN Take as directed by the anticoagulation clinic. If you are unsure how to take this medication, talk to your nurse or doctor. Original instructions: Take one tablet by mouth ine the evening Mon,Wed,Fri,Sun. On HOLD from 12/23/21-12/28/21        Review of Systems  Constitutional:  Negative for activity change, appetite change and fever.  HENT:  Negative for sore throat.   Eyes: Negative.   Cardiovascular:  Negative for chest pain and leg swelling.  Gastrointestinal:  Negative for abdominal distention, diarrhea and vomiting.  Genitourinary:  Negative for dysuria, frequency and urgency.  Skin:  Negative for color change.  Neurological:  Negative for dizziness and headaches.  Psychiatric/Behavioral:  Negative for behavioral problems and sleep disturbance. The patient is not nervous/anxious.     Immunization History  Administered Date(s) Administered   Fluad Quad(high Dose 65+) 09/14/2018   H1N1 12/13/2007   Influenza Split 09/11/2010   Influenza Whole  09/12/2011   Influenza, High Dose Seasonal PF 09/20/2012, 09/07/2017   Influenza, Seasonal, Injecte, Preservative Fre 09/08/2007, 09/26/2008, 10/02/2014, 09/18/2015   Influenza,inj,Quad PF,6+ Mos 09/19/2013, 09/21/2015   Influenza-Unspecified 09/07/2017, 09/11/2019, 09/03/2020, 10/03/2021   Moderna Sars-Covid-2 Vaccination 01/18/2019, 02/15/2019   Pneumococcal Conjugate-13 09/19/2013   Pneumococcal Polysaccharide-23 09/26/2003, 06/28/2016   Tdap 06/04/2010, 08/15/2021   Tetanus 08/04/1998   Unspecified SARS-COV-2 Vaccination 06/01/2021   Zoster, Live 01/16/2004   Pertinent  Health Maintenance Due  Topic Date Due   HEMOGLOBIN A1C  08/25/2021   OPHTHALMOLOGY EXAM  04/17/2022   FOOT EXAM  08/20/2022   INFLUENZA VACCINE  Completed      12/05/2021    8:00 AM 12/05/2021   10:38 PM 12/06/2021    7:05 AM 12/06/2021    7:40 PM 12/07/2021    8:45 AM  Fall Risk  Patient Fall Risk Level High fall risk Moderate fall risk High fall risk High fall risk High fall risk   Functional Status Survey:    Vitals:   12/28/21 1100  BP: (!) 116/53  Pulse: 70  Resp: 16  Temp: (!) 97.2 F (36.2 C)  SpO2: 92%  Weight: 192 lb (87.1 kg)  Height: '5\' 10"'$  (1.778 m)   Body mass index is 27.55 kg/m. Physical Exam Constitutional:      Appearance: Normal appearance.  HENT:     Head: Normocephalic and atraumatic.     Mouth/Throat:     Mouth: Mucous membranes are moist.  Eyes:     Conjunctiva/sclera: Conjunctivae normal.  Cardiovascular:     Rate and Rhythm: Normal rate. Rhythm irregular.     Pulses: Normal pulses.     Heart sounds: Normal heart sounds.  Pulmonary:     Effort: Pulmonary effort is normal.     Breath sounds: Normal breath sounds.  Abdominal:     General: Bowel sounds are normal.     Palpations: Abdomen is soft.  Musculoskeletal:        General: No swelling. Normal range of motion.     Cervical back: Normal range  of motion.  Skin:    General: Skin is warm and dry.     Findings:  Bruising present.     Comments: Bruise on left eye  Neurological:     General: No focal deficit present.     Mental Status: He is alert and oriented to person, place, and time.  Psychiatric:        Mood and Affect: Mood normal.        Behavior: Behavior normal.        Thought Content: Thought content normal.        Judgment: Judgment normal.     Labs reviewed: Recent Labs    09/02/21 0311 09/03/21 0457 09/16/21 0949 12/04/21 0118 12/04/21 0500 12/06/21 0348  NA 138 137  --  137 136 132*  K 3.6 3.9  --  3.7 3.6 3.7  CL 109 105  --  106 105 103  CO2 25 25  --  '23 25 22  '$ GLUCOSE 125* 112*  --  186* 164* 166*  BUN 26* 22  --  26* 25* 26*  CREATININE 0.91 0.92  --  0.77 0.79 0.79  CALCIUM 10.0 10.5*  --  10.5* 9.8 9.8  MG 1.5* 1.6* 1.6  --   --   --    Recent Labs    02/25/21 1507  AST 15  ALT 11  ALKPHOS 66  BILITOT 0.8  PROT 6.9  ALBUMIN 3.8   Recent Labs    09/01/21 1139 12/04/21 0118 12/04/21 0500 12/06/21 0348 12/07/21 0623  WBC 7.4 12.3* 9.5 11.0* 10.6*  NEUTROABS 5.0 10.2*  --   --   --   HGB 14.0 12.0* 11.0* 10.4* 10.7*  HCT 41.7 34.8* 32.9* 30.8* 30.9*  MCV 94.3 92.3 93.7 93.1 93.1  PLT 134* 174 157 138* 139*   Lab Results  Component Value Date   TSH 0.76 12/11/2012   Lab Results  Component Value Date   HGBA1C 7.6 (H) 02/25/2021   Lab Results  Component Value Date   CHOL 131 02/25/2021   HDL 70.00 02/25/2021   LDLCALC 47 02/25/2021   TRIG 67.0 02/25/2021   CHOLHDL 2 02/25/2021    Significant Diagnostic Results in last 30 days:  CUP PACEART REMOTE DEVICE CHECK  Result Date: 12/14/2021 Scheduled remote reviewed. Normal device function.  Next remote 91 days. [place]  CT CERVICAL SPINE WO CONTRAST  Result Date: 12/03/2021 CLINICAL DATA:  Fall on coumadin EXAM: CT CERVICAL SPINE WITHOUT CONTRAST TECHNIQUE: Multidetector CT imaging of the cervical spine was performed without intravenous contrast. Multiplanar CT image reconstructions were also  generated. RADIATION DOSE REDUCTION: This exam was performed according to the departmental dose-optimization program which includes automated exposure control, adjustment of the mA and/or kV according to patient size and/or use of iterative reconstruction technique. COMPARISON:  11/10/2021 FINDINGS: Alignment: Normal Skull base and vertebrae: No acute fracture. No primary bone lesion or focal pathologic process. Soft tissues and spinal canal: No prevertebral fluid or swelling. No visible canal hematoma. Disc levels: Diffuse degenerative disc and facet disease. No visible disc herniation. Upper chest: No acute findings Other: None IMPRESSION: Diffuse degenerative disc and facet disease. No acute bony abnormality. Electronically Signed   By: Rolm Baptise M.D.   On: 12/03/2021 21:20   CT MAXILLOFACIAL WO CONTRAST  Result Date: 12/03/2021 CLINICAL DATA:  Fall, on blood thinners EXAM: CT MAXILLOFACIAL WITHOUT CONTRAST TECHNIQUE: Multidetector CT imaging of the maxillofacial structures was performed. Multiplanar CT image reconstructions were also generated. RADIATION  DOSE REDUCTION: This exam was performed according to the departmental dose-optimization program which includes automated exposure control, adjustment of the mA and/or kV according to patient size and/or use of iterative reconstruction technique. COMPARISON:  11/10/2021 FINDINGS: Osseous: Left nasal bone fractures are again noted, unchanged since prior study. Nasal septum is deviated to the left, likely on a chronic basis, stable since prior study. No acute facial fracture. Orbits: Negative. No traumatic or inflammatory finding. Sinuses: Evidence of prior surgery.  No air-fluid levels. Soft tissues: Soft tissue swelling over the left forehead. Limited intracranial: See head CT report IMPRESSION: Left nasal bone fractures are stable since prior study 11/10/2021. No acute facial or orbital fracture. Electronically Signed   By: Rolm Baptise M.D.   On:  12/03/2021 21:18   CT Head Wo Contrast  Result Date: 12/03/2021 CLINICAL DATA:  Fall, on blood thinners EXAM: CT HEAD WITHOUT CONTRAST TECHNIQUE: Contiguous axial images were obtained from the base of the skull through the vertex without intravenous contrast. RADIATION DOSE REDUCTION: This exam was performed according to the departmental dose-optimization program which includes automated exposure control, adjustment of the mA and/or kV according to patient size and/or use of iterative reconstruction technique. COMPARISON:  11/10/2021 FINDINGS: Brain: There is atrophy and chronic small vessel disease changes. No acute intracranial abnormality. Specifically, no hemorrhage, hydrocephalus, mass lesion, acute infarction, or significant intracranial injury. Vascular: No hyperdense vessel or unexpected calcification. Skull: No acute calvarial abnormality. Sinuses/Orbits: No acute findings Other: Soft tissue swelling over the left forehead. IMPRESSION: Atrophy, chronic microvascular disease. No acute intracranial abnormality. Electronically Signed   By: Rolm Baptise M.D.   On: 12/03/2021 21:16   DG Humerus Right  Result Date: 12/03/2021 CLINICAL DATA:  Fall, right shoulder pain EXAM: RIGHT HUMERUS - 2+ VIEW COMPARISON:  Right shoulder series FINDINGS: Fracture seen in the proximal right humerus involving the greater tuberosity and likely the humeral neck. No subluxation or dislocation. No additional humeral abnormality. IMPRESSION: Proximal right humeral fracture involving the greater tuberosity likely humeral neck. Electronically Signed   By: Rolm Baptise M.D.   On: 12/03/2021 21:12   DG Shoulder Right  Result Date: 12/03/2021 CLINICAL DATA:  Fall, right shoulder pain EXAM: RIGHT SHOULDER - 2+ VIEW COMPARISON:  None Available. FINDINGS: Fracture noted through the proximal right humerus in the region of the greater tuberosity. Probable humeral neck fracture. No subluxation or dislocation. Degenerative changes in  the right AC joint. IMPRESSION: Proximal right humeral fracture involving the greater tuberosity and likely humeral neck. Electronically Signed   By: Rolm Baptise M.D.   On: 12/03/2021 21:11   DG Elbow 2 Views Right  Result Date: 12/03/2021 CLINICAL DATA:  Fall EXAM: RIGHT ELBOW - 2 VIEW COMPARISON:  None Available. FINDINGS: Degenerative changes in the right elbow with joint space narrowing and spurring. No acute bony abnormality. Specifically, no fracture, subluxation, or dislocation. No joint effusion. IMPRESSION: No acute bony abnormality. Electronically Signed   By: Rolm Baptise M.D.   On: 12/03/2021 21:10    Assessment/Plan  1. Long term (current) use of anticoagulants -  INR 3.6 - warfarin (COUMADIN) 5 MG tablet; Take 1 tablet (5 mg total) by mouth daily. -  re-check INR on 12/30/21  2. Atrial fibrillation, chronic (HCC) - warfarin (COUMADIN) 5 MG tablet; Take 1 tablet (5 mg total) by mouth daily for anticoagulation -      Family/ staff Communication:  Discussed plan of care with resident and charge nurse.  Labs/tests ordered:   INR  on 12/30/21

## 2021-12-29 DIAGNOSIS — D696 Thrombocytopenia, unspecified: Secondary | ICD-10-CM | POA: Diagnosis not present

## 2021-12-29 DIAGNOSIS — S42201D Unspecified fracture of upper end of right humerus, subsequent encounter for fracture with routine healing: Secondary | ICD-10-CM | POA: Diagnosis not present

## 2021-12-29 DIAGNOSIS — Z9181 History of falling: Secondary | ICD-10-CM | POA: Diagnosis not present

## 2021-12-29 DIAGNOSIS — M19122 Post-traumatic osteoarthritis, left elbow: Secondary | ICD-10-CM | POA: Diagnosis not present

## 2021-12-29 DIAGNOSIS — M6281 Muscle weakness (generalized): Secondary | ICD-10-CM | POA: Diagnosis not present

## 2021-12-29 DIAGNOSIS — I442 Atrioventricular block, complete: Secondary | ICD-10-CM | POA: Diagnosis not present

## 2021-12-29 DIAGNOSIS — Z950 Presence of cardiac pacemaker: Secondary | ICD-10-CM | POA: Diagnosis not present

## 2021-12-29 DIAGNOSIS — R262 Difficulty in walking, not elsewhere classified: Secondary | ICD-10-CM | POA: Diagnosis not present

## 2021-12-29 DIAGNOSIS — R2689 Other abnormalities of gait and mobility: Secondary | ICD-10-CM | POA: Diagnosis not present

## 2021-12-29 DIAGNOSIS — S42402D Unspecified fracture of lower end of left humerus, subsequent encounter for fracture with routine healing: Secondary | ICD-10-CM | POA: Diagnosis not present

## 2021-12-29 DIAGNOSIS — M7022 Olecranon bursitis, left elbow: Secondary | ICD-10-CM | POA: Diagnosis not present

## 2021-12-29 DIAGNOSIS — I495 Sick sinus syndrome: Secondary | ICD-10-CM | POA: Diagnosis not present

## 2021-12-29 DIAGNOSIS — E1140 Type 2 diabetes mellitus with diabetic neuropathy, unspecified: Secondary | ICD-10-CM | POA: Diagnosis not present

## 2021-12-29 DIAGNOSIS — R278 Other lack of coordination: Secondary | ICD-10-CM | POA: Diagnosis not present

## 2021-12-29 DIAGNOSIS — R2681 Unsteadiness on feet: Secondary | ICD-10-CM | POA: Diagnosis not present

## 2021-12-30 DIAGNOSIS — M7022 Olecranon bursitis, left elbow: Secondary | ICD-10-CM | POA: Diagnosis not present

## 2021-12-30 DIAGNOSIS — R2689 Other abnormalities of gait and mobility: Secondary | ICD-10-CM | POA: Diagnosis not present

## 2021-12-30 DIAGNOSIS — D696 Thrombocytopenia, unspecified: Secondary | ICD-10-CM | POA: Diagnosis not present

## 2021-12-30 DIAGNOSIS — M25561 Pain in right knee: Secondary | ICD-10-CM | POA: Diagnosis not present

## 2021-12-30 DIAGNOSIS — M19122 Post-traumatic osteoarthritis, left elbow: Secondary | ICD-10-CM | POA: Diagnosis not present

## 2021-12-30 DIAGNOSIS — R278 Other lack of coordination: Secondary | ICD-10-CM | POA: Diagnosis not present

## 2021-12-30 DIAGNOSIS — R2681 Unsteadiness on feet: Secondary | ICD-10-CM | POA: Diagnosis not present

## 2021-12-30 DIAGNOSIS — I482 Chronic atrial fibrillation, unspecified: Secondary | ICD-10-CM | POA: Diagnosis not present

## 2021-12-30 DIAGNOSIS — M6281 Muscle weakness (generalized): Secondary | ICD-10-CM | POA: Diagnosis not present

## 2021-12-30 DIAGNOSIS — I442 Atrioventricular block, complete: Secondary | ICD-10-CM | POA: Diagnosis not present

## 2021-12-30 DIAGNOSIS — I495 Sick sinus syndrome: Secondary | ICD-10-CM | POA: Diagnosis not present

## 2021-12-30 DIAGNOSIS — R262 Difficulty in walking, not elsewhere classified: Secondary | ICD-10-CM | POA: Diagnosis not present

## 2021-12-30 DIAGNOSIS — S42402D Unspecified fracture of lower end of left humerus, subsequent encounter for fracture with routine healing: Secondary | ICD-10-CM | POA: Diagnosis not present

## 2021-12-30 DIAGNOSIS — G8929 Other chronic pain: Secondary | ICD-10-CM | POA: Diagnosis not present

## 2021-12-30 DIAGNOSIS — Z9181 History of falling: Secondary | ICD-10-CM | POA: Diagnosis not present

## 2021-12-30 DIAGNOSIS — Z950 Presence of cardiac pacemaker: Secondary | ICD-10-CM | POA: Diagnosis not present

## 2021-12-30 DIAGNOSIS — S42201D Unspecified fracture of upper end of right humerus, subsequent encounter for fracture with routine healing: Secondary | ICD-10-CM | POA: Diagnosis not present

## 2021-12-30 DIAGNOSIS — E1140 Type 2 diabetes mellitus with diabetic neuropathy, unspecified: Secondary | ICD-10-CM | POA: Diagnosis not present

## 2021-12-31 DIAGNOSIS — I442 Atrioventricular block, complete: Secondary | ICD-10-CM | POA: Diagnosis not present

## 2021-12-31 DIAGNOSIS — Z950 Presence of cardiac pacemaker: Secondary | ICD-10-CM | POA: Diagnosis not present

## 2021-12-31 DIAGNOSIS — S42402D Unspecified fracture of lower end of left humerus, subsequent encounter for fracture with routine healing: Secondary | ICD-10-CM | POA: Diagnosis not present

## 2021-12-31 DIAGNOSIS — M7022 Olecranon bursitis, left elbow: Secondary | ICD-10-CM | POA: Diagnosis not present

## 2021-12-31 DIAGNOSIS — R2689 Other abnormalities of gait and mobility: Secondary | ICD-10-CM | POA: Diagnosis not present

## 2021-12-31 DIAGNOSIS — I495 Sick sinus syndrome: Secondary | ICD-10-CM | POA: Diagnosis not present

## 2021-12-31 DIAGNOSIS — M6281 Muscle weakness (generalized): Secondary | ICD-10-CM | POA: Diagnosis not present

## 2021-12-31 DIAGNOSIS — S42201D Unspecified fracture of upper end of right humerus, subsequent encounter for fracture with routine healing: Secondary | ICD-10-CM | POA: Diagnosis not present

## 2021-12-31 DIAGNOSIS — R278 Other lack of coordination: Secondary | ICD-10-CM | POA: Diagnosis not present

## 2021-12-31 DIAGNOSIS — R2681 Unsteadiness on feet: Secondary | ICD-10-CM | POA: Diagnosis not present

## 2021-12-31 DIAGNOSIS — I482 Chronic atrial fibrillation, unspecified: Secondary | ICD-10-CM | POA: Diagnosis not present

## 2021-12-31 DIAGNOSIS — D696 Thrombocytopenia, unspecified: Secondary | ICD-10-CM | POA: Diagnosis not present

## 2021-12-31 DIAGNOSIS — M19122 Post-traumatic osteoarthritis, left elbow: Secondary | ICD-10-CM | POA: Diagnosis not present

## 2021-12-31 DIAGNOSIS — Z9181 History of falling: Secondary | ICD-10-CM | POA: Diagnosis not present

## 2021-12-31 DIAGNOSIS — E1140 Type 2 diabetes mellitus with diabetic neuropathy, unspecified: Secondary | ICD-10-CM | POA: Diagnosis not present

## 2021-12-31 DIAGNOSIS — R262 Difficulty in walking, not elsewhere classified: Secondary | ICD-10-CM | POA: Diagnosis not present

## 2022-01-01 DIAGNOSIS — S42201D Unspecified fracture of upper end of right humerus, subsequent encounter for fracture with routine healing: Secondary | ICD-10-CM | POA: Diagnosis not present

## 2022-01-01 DIAGNOSIS — R2681 Unsteadiness on feet: Secondary | ICD-10-CM | POA: Diagnosis not present

## 2022-01-01 DIAGNOSIS — Z950 Presence of cardiac pacemaker: Secondary | ICD-10-CM | POA: Diagnosis not present

## 2022-01-01 DIAGNOSIS — R2689 Other abnormalities of gait and mobility: Secondary | ICD-10-CM | POA: Diagnosis not present

## 2022-01-01 DIAGNOSIS — I495 Sick sinus syndrome: Secondary | ICD-10-CM | POA: Diagnosis not present

## 2022-01-01 DIAGNOSIS — S42402D Unspecified fracture of lower end of left humerus, subsequent encounter for fracture with routine healing: Secondary | ICD-10-CM | POA: Diagnosis not present

## 2022-01-01 DIAGNOSIS — M7022 Olecranon bursitis, left elbow: Secondary | ICD-10-CM | POA: Diagnosis not present

## 2022-01-01 DIAGNOSIS — E1140 Type 2 diabetes mellitus with diabetic neuropathy, unspecified: Secondary | ICD-10-CM | POA: Diagnosis not present

## 2022-01-01 DIAGNOSIS — D696 Thrombocytopenia, unspecified: Secondary | ICD-10-CM | POA: Diagnosis not present

## 2022-01-01 DIAGNOSIS — R278 Other lack of coordination: Secondary | ICD-10-CM | POA: Diagnosis not present

## 2022-01-01 DIAGNOSIS — Z9181 History of falling: Secondary | ICD-10-CM | POA: Diagnosis not present

## 2022-01-01 DIAGNOSIS — M19122 Post-traumatic osteoarthritis, left elbow: Secondary | ICD-10-CM | POA: Diagnosis not present

## 2022-01-01 DIAGNOSIS — R262 Difficulty in walking, not elsewhere classified: Secondary | ICD-10-CM | POA: Diagnosis not present

## 2022-01-01 DIAGNOSIS — I442 Atrioventricular block, complete: Secondary | ICD-10-CM | POA: Diagnosis not present

## 2022-01-01 DIAGNOSIS — M6281 Muscle weakness (generalized): Secondary | ICD-10-CM | POA: Diagnosis not present

## 2022-01-03 DIAGNOSIS — M19122 Post-traumatic osteoarthritis, left elbow: Secondary | ICD-10-CM | POA: Diagnosis not present

## 2022-01-03 DIAGNOSIS — Z950 Presence of cardiac pacemaker: Secondary | ICD-10-CM | POA: Diagnosis not present

## 2022-01-03 DIAGNOSIS — S42402D Unspecified fracture of lower end of left humerus, subsequent encounter for fracture with routine healing: Secondary | ICD-10-CM | POA: Diagnosis not present

## 2022-01-03 DIAGNOSIS — I442 Atrioventricular block, complete: Secondary | ICD-10-CM | POA: Diagnosis not present

## 2022-01-03 DIAGNOSIS — R278 Other lack of coordination: Secondary | ICD-10-CM | POA: Diagnosis not present

## 2022-01-03 DIAGNOSIS — R262 Difficulty in walking, not elsewhere classified: Secondary | ICD-10-CM | POA: Diagnosis not present

## 2022-01-03 DIAGNOSIS — I495 Sick sinus syndrome: Secondary | ICD-10-CM | POA: Diagnosis not present

## 2022-01-03 DIAGNOSIS — E1140 Type 2 diabetes mellitus with diabetic neuropathy, unspecified: Secondary | ICD-10-CM | POA: Diagnosis not present

## 2022-01-03 DIAGNOSIS — Z9181 History of falling: Secondary | ICD-10-CM | POA: Diagnosis not present

## 2022-01-03 DIAGNOSIS — R2681 Unsteadiness on feet: Secondary | ICD-10-CM | POA: Diagnosis not present

## 2022-01-03 DIAGNOSIS — D696 Thrombocytopenia, unspecified: Secondary | ICD-10-CM | POA: Diagnosis not present

## 2022-01-03 DIAGNOSIS — M7022 Olecranon bursitis, left elbow: Secondary | ICD-10-CM | POA: Diagnosis not present

## 2022-01-03 DIAGNOSIS — R2689 Other abnormalities of gait and mobility: Secondary | ICD-10-CM | POA: Diagnosis not present

## 2022-01-03 DIAGNOSIS — M6281 Muscle weakness (generalized): Secondary | ICD-10-CM | POA: Diagnosis not present

## 2022-01-03 DIAGNOSIS — S42201D Unspecified fracture of upper end of right humerus, subsequent encounter for fracture with routine healing: Secondary | ICD-10-CM | POA: Diagnosis not present

## 2022-01-04 DIAGNOSIS — E1140 Type 2 diabetes mellitus with diabetic neuropathy, unspecified: Secondary | ICD-10-CM | POA: Diagnosis not present

## 2022-01-04 DIAGNOSIS — S42201D Unspecified fracture of upper end of right humerus, subsequent encounter for fracture with routine healing: Secondary | ICD-10-CM | POA: Diagnosis not present

## 2022-01-04 DIAGNOSIS — I495 Sick sinus syndrome: Secondary | ICD-10-CM | POA: Diagnosis not present

## 2022-01-04 DIAGNOSIS — S42402D Unspecified fracture of lower end of left humerus, subsequent encounter for fracture with routine healing: Secondary | ICD-10-CM | POA: Diagnosis not present

## 2022-01-04 DIAGNOSIS — Z9181 History of falling: Secondary | ICD-10-CM | POA: Diagnosis not present

## 2022-01-04 DIAGNOSIS — M19122 Post-traumatic osteoarthritis, left elbow: Secondary | ICD-10-CM | POA: Diagnosis not present

## 2022-01-04 DIAGNOSIS — D696 Thrombocytopenia, unspecified: Secondary | ICD-10-CM | POA: Diagnosis not present

## 2022-01-04 DIAGNOSIS — R262 Difficulty in walking, not elsewhere classified: Secondary | ICD-10-CM | POA: Diagnosis not present

## 2022-01-04 DIAGNOSIS — R2681 Unsteadiness on feet: Secondary | ICD-10-CM | POA: Diagnosis not present

## 2022-01-04 DIAGNOSIS — M6281 Muscle weakness (generalized): Secondary | ICD-10-CM | POA: Diagnosis not present

## 2022-01-04 DIAGNOSIS — Z950 Presence of cardiac pacemaker: Secondary | ICD-10-CM | POA: Diagnosis not present

## 2022-01-04 DIAGNOSIS — I442 Atrioventricular block, complete: Secondary | ICD-10-CM | POA: Diagnosis not present

## 2022-01-04 DIAGNOSIS — R2689 Other abnormalities of gait and mobility: Secondary | ICD-10-CM | POA: Diagnosis not present

## 2022-01-04 DIAGNOSIS — M7022 Olecranon bursitis, left elbow: Secondary | ICD-10-CM | POA: Diagnosis not present

## 2022-01-04 DIAGNOSIS — R278 Other lack of coordination: Secondary | ICD-10-CM | POA: Diagnosis not present

## 2022-01-05 DIAGNOSIS — M7022 Olecranon bursitis, left elbow: Secondary | ICD-10-CM | POA: Diagnosis not present

## 2022-01-05 DIAGNOSIS — S42402D Unspecified fracture of lower end of left humerus, subsequent encounter for fracture with routine healing: Secondary | ICD-10-CM | POA: Diagnosis not present

## 2022-01-05 DIAGNOSIS — E1140 Type 2 diabetes mellitus with diabetic neuropathy, unspecified: Secondary | ICD-10-CM | POA: Diagnosis not present

## 2022-01-05 DIAGNOSIS — Z9181 History of falling: Secondary | ICD-10-CM | POA: Diagnosis not present

## 2022-01-05 DIAGNOSIS — M6281 Muscle weakness (generalized): Secondary | ICD-10-CM | POA: Diagnosis not present

## 2022-01-05 DIAGNOSIS — R2689 Other abnormalities of gait and mobility: Secondary | ICD-10-CM | POA: Diagnosis not present

## 2022-01-05 DIAGNOSIS — I495 Sick sinus syndrome: Secondary | ICD-10-CM | POA: Diagnosis not present

## 2022-01-05 DIAGNOSIS — R278 Other lack of coordination: Secondary | ICD-10-CM | POA: Diagnosis not present

## 2022-01-05 DIAGNOSIS — S42201D Unspecified fracture of upper end of right humerus, subsequent encounter for fracture with routine healing: Secondary | ICD-10-CM | POA: Diagnosis not present

## 2022-01-05 DIAGNOSIS — I442 Atrioventricular block, complete: Secondary | ICD-10-CM | POA: Diagnosis not present

## 2022-01-05 DIAGNOSIS — M19122 Post-traumatic osteoarthritis, left elbow: Secondary | ICD-10-CM | POA: Diagnosis not present

## 2022-01-05 DIAGNOSIS — R2681 Unsteadiness on feet: Secondary | ICD-10-CM | POA: Diagnosis not present

## 2022-01-05 DIAGNOSIS — Z950 Presence of cardiac pacemaker: Secondary | ICD-10-CM | POA: Diagnosis not present

## 2022-01-05 DIAGNOSIS — R262 Difficulty in walking, not elsewhere classified: Secondary | ICD-10-CM | POA: Diagnosis not present

## 2022-01-05 DIAGNOSIS — D696 Thrombocytopenia, unspecified: Secondary | ICD-10-CM | POA: Diagnosis not present

## 2022-01-06 DIAGNOSIS — M7022 Olecranon bursitis, left elbow: Secondary | ICD-10-CM | POA: Diagnosis not present

## 2022-01-06 DIAGNOSIS — Z950 Presence of cardiac pacemaker: Secondary | ICD-10-CM | POA: Diagnosis not present

## 2022-01-06 DIAGNOSIS — S42201D Unspecified fracture of upper end of right humerus, subsequent encounter for fracture with routine healing: Secondary | ICD-10-CM | POA: Diagnosis not present

## 2022-01-06 DIAGNOSIS — D696 Thrombocytopenia, unspecified: Secondary | ICD-10-CM | POA: Diagnosis not present

## 2022-01-06 DIAGNOSIS — E1140 Type 2 diabetes mellitus with diabetic neuropathy, unspecified: Secondary | ICD-10-CM | POA: Diagnosis not present

## 2022-01-06 DIAGNOSIS — M19122 Post-traumatic osteoarthritis, left elbow: Secondary | ICD-10-CM | POA: Diagnosis not present

## 2022-01-06 DIAGNOSIS — I442 Atrioventricular block, complete: Secondary | ICD-10-CM | POA: Diagnosis not present

## 2022-01-06 DIAGNOSIS — S42402D Unspecified fracture of lower end of left humerus, subsequent encounter for fracture with routine healing: Secondary | ICD-10-CM | POA: Diagnosis not present

## 2022-01-06 DIAGNOSIS — R2689 Other abnormalities of gait and mobility: Secondary | ICD-10-CM | POA: Diagnosis not present

## 2022-01-06 DIAGNOSIS — R2681 Unsteadiness on feet: Secondary | ICD-10-CM | POA: Diagnosis not present

## 2022-01-06 DIAGNOSIS — I482 Chronic atrial fibrillation, unspecified: Secondary | ICD-10-CM | POA: Diagnosis not present

## 2022-01-06 DIAGNOSIS — R278 Other lack of coordination: Secondary | ICD-10-CM | POA: Diagnosis not present

## 2022-01-06 DIAGNOSIS — M6281 Muscle weakness (generalized): Secondary | ICD-10-CM | POA: Diagnosis not present

## 2022-01-06 DIAGNOSIS — R262 Difficulty in walking, not elsewhere classified: Secondary | ICD-10-CM | POA: Diagnosis not present

## 2022-01-06 DIAGNOSIS — Z9181 History of falling: Secondary | ICD-10-CM | POA: Diagnosis not present

## 2022-01-06 DIAGNOSIS — I495 Sick sinus syndrome: Secondary | ICD-10-CM | POA: Diagnosis not present

## 2022-01-06 LAB — POCT INR: INR: 6.4 — AB (ref 0.80–1.20)

## 2022-01-06 LAB — PROTIME-INR: Protime: 57.9 — AB (ref 10.0–13.8)

## 2022-01-06 NOTE — Progress Notes (Incomplete)
Location:   [facility] Room Number: 107 A Place of Service:  SNF [phone]) Provider:  Durenda Age, DNP  Dewayne Shorter, MD  Patient Care Team: Dewayne Shorter, MD as PCP - General (Family Medicine) Minna Merritts, MD as PCP - Cardiology (Cardiology) Minna Merritts, MD as Consulting Physician (Cardiology)  Extended Emergency Contact Information Primary Emergency Contact: [place] Phone: [phone] Relation: Son  Code Status:  DNR Goals of care: Advanced Directive information    12/28/2021   11:03 AM  Advanced Directives  Does Patient Have a Medical Advance Directive? Yes  Type of Paramedic of [place];Living will;Out of facility DNR (pink MOST or yellow form)  Does patient want to make changes to medical advance directive? No - Patient declined  Copy of [place] in Chart? Yes - validated most recent copy scanned in chart (See row information)  Pre-existing out of facility DNR order (yellow form or pink MOST form) Yellow form placed in chart (order not valid for inpatient use)     Chief Complaint  Patient presents with  . Acute Visit    Coumadin therapy    HPI:  Pt is a 89 y.o. male seen today for an acute visit for Coumadin therapy.    Past Medical History:  Diagnosis Date  . Asthma   . Atrial flutter ([place]) 2007  . Band keratopathy   . Benign prostatic hypertrophy   . Chronic ear infection   . Chronic kidney disease   . Chronic prostatitis   . Dental bridge present    permanent - upper  . Diabetes mellitus   . Elbow stiffness, left    s/p fracture many yrs ago.  arm does not straighten  . GERD (gastroesophageal reflux disease)    laryngeal involvement  . Hyperlipidemia   . Hypertension   . Obstructive sleep apnea    CPAP-9  . Osteoarthrosis, localized, primary, knee    post-traumatic  . Prostatitis   . Stroke [facility])    TIA  . Tachy-brady  syndrome ([place])   . UTI (urinary tract infection)    Past Surgical History:  Procedure Laterality Date  . APPENDECTOMY    . BELPHAROPTOSIS REPAIR     Dr [name]---didn't resolve weepy eye and eyelid drooping  . CARDIOVERSION  04/13/2010  . CATARACT EXTRACTION, BILATERAL  2009  . Chest pain  8/12   Stress test benign  . ESOPHAGEAL DILATION  05/26/2016   Procedure: ESOPHAGEAL DILATION;  Surgeon: Lucilla Lame, MD;  Location: [facility];  Service: Endoscopy;;  . ESOPHAGOGASTRODUODENOSCOPY (EGD) WITH PROPOFOL N/A 05/26/2016   Procedure: ESOPHAGOGASTRODUODENOSCOPY (EGD) WITH PROPOFOL;  Surgeon: Lucilla Lame, MD;  Location: [facility];  Service: Endoscopy;  Laterality: N/A;  Diabetic - oral meds sleep apnea  . EYE SURGERY    . FRACTURE SURGERY Left    elbow  . KNEE SURGERY  1998   plate after fracture, then removed for infection  . left elbow surgery    . MASTOIDECTOMY  8/08   Dr Idelle Crouch  . PACEMAKER IMPLANT N/A 09/02/2021   Procedure: PACEMAKER IMPLANT;  Surgeon: Vickie Epley, MD;  Location: [facility] CV [facility];  Service: Cardiovascular;  Laterality: N/A;  . RHINOPLASTY  5/10   and septoplasty  . SUBACROMIAL DECOMPRESSION Right 2005   Arthroscopic (for rotator cuff and biceps tendon ruptures)  . TEAR DUCT PROBING  11/13   Dr Vickki Muff  . TOTAL KNEE ARTHROPLASTY Left 09/01/2014  Procedure: TOTAL KNEE ARTHROPLASTY;  Surgeon: Dereck Leep, MD;  Location: [facility];  Service: Orthopedics;  Laterality: Left;  . TRIGGER FINGER RELEASE Left 02/25/2015   Procedure: LEFT LONG TRIGGER RELEASE;  Surgeon: Dereck Leep, MD;  Location: [facility];  Service: Orthopedics;  Laterality: Left;  Marland Kitchen VENOUS ABLATION      Allergies  Allergen Reactions  . Lovenox [Enoxaparin] Hives  . Proscar [Finasteride] Other (See Comments)    Upper body and arm weakness  . Latex Rash    Has trouble with BANDAIDS that have been left on for more than 24 hours. Prefers paper tape.  . Nickel Rash     Localized rash  . Percocet [Oxycodone-Acetaminophen] Rash  . Tape Rash and Other (See Comments)    Sensitivity     Allergies as of 12/28/2021       Reactions   Lovenox [enoxaparin] Hives   Proscar [finasteride] Other (See Comments)   Upper body and arm weakness   Latex Rash   Has trouble with BANDAIDS that have been left on for more than 24 hours. Prefers paper tape.   Nickel Rash   Localized rash   Percocet [oxycodone-acetaminophen] Rash   Tape Rash, Other (See Comments)   Sensitivity         Medication List        Accurate as of December 28, 2021 11:13 AM. If you have any questions, ask your nurse or doctor.          acetaminophen 500 MG tablet Commonly known as: TYLENOL Take 1,000 mg by mouth every 8 (eight) hours as needed.   APPLE CIDER VINEGAR PO Take 1 capsule by mouth at bedtime.   atorvastatin 80 MG tablet Commonly known as: LIPITOR Take 1 tablet (80 mg total) by mouth daily.   B-12 PO Place 1 tablet under the tongue daily.   cephALEXin 500 MG capsule Commonly known as: Keflex Take 1 capsule (500 mg total) by mouth 3 (three) times daily for 10 days.   cetirizine 5 MG tablet Commonly known as: ZYRTEC Take 5 mg by mouth daily.   Cranberry 250 MG Tabs Take 1 tablet by mouth 2 (two) times daily.   docusate sodium 100 MG capsule Commonly known as: COLACE Take 100 mg by mouth 2 (two) times daily.   finasteride 5 MG tablet Commonly known as: PROSCAR Take 1 tablet (5 mg total) by mouth daily.   lidocaine 5 % Commonly known as: LIDODERM Place 1 patch onto the skin daily. Remove & Discard patch within 12 hours or as directed by MD   Magnesium Oxide 400 MG Caps Take 1 capsule (400 mg total) by mouth daily.   metFORMIN 500 MG tablet Commonly known as: GLUCOPHAGE TAKE 1 TABLET BY MOUTH  TWICE DAILY WITH MEALS   methocarbamol 500 MG tablet Commonly known as: ROBAXIN Take 1 tablet (500 mg total) by mouth every 8 (eight) hours as needed for  muscle spasms.   senna 8.6 MG Tabs tablet Commonly known as: SENOKOT Take 1 tablet (8.6 mg total) by mouth daily.   silver sulfADIAZINE 1 % cream Commonly known as: SILVADENE Apply 1 Application topically daily.   SYSTANE ULTRA OP Place 1 drop into both eyes daily as needed (dry eyes).   tamsulosin 0.4 MG Caps capsule Commonly known as: FLOMAX Take 2 capsules (0.8 mg total) by mouth at bedtime.   traMADol 50 MG tablet Commonly known as: ULTRAM Take 1 tablet (50 mg total) by mouth every 6 (  six) hours as needed for moderate pain.   triamterene-hydrochlorothiazide 37.5-25 MG capsule Commonly known as: DYAZIDE TAKE 1 CAPSULE BY MOUTH IN  THE MORNING   warfarin 7.5 MG tablet Commonly known as: COUMADIN Take as directed by the anticoagulation clinic. If you are unsure how to take this medication, talk to your nurse or doctor. Original instructions: Take one tablet by mouth ine the evening Mon,Wed,Fri,Sun. On HOLD from 12/23/21-12/28/21        Review of Systems  Immunization History  Administered Date(s) Administered  . Fluad Quad(high Dose 65+) 09/14/2018  . H1N1 12/13/2007  . Influenza Split 09/11/2010  . Influenza Whole 09/12/2011  . Influenza, High Dose Seasonal PF 09/20/2012, 09/07/2017  . Influenza, Seasonal, Injecte, Preservative Fre 09/08/2007, 09/26/2008, 10/02/2014, 09/18/2015  . Influenza,inj,Quad PF,6+ Mos 09/19/2013, 09/21/2015  . Influenza-Unspecified 09/07/2017, 09/11/2019, 09/03/2020, 10/03/2021  . Moderna Sars-Covid-2 Vaccination 01/18/2019, 02/15/2019  . Pneumococcal Conjugate-13 09/19/2013  . Pneumococcal Polysaccharide-23 09/26/2003, 06/28/2016  . Tdap 06/04/2010, 08/15/2021  . Tetanus 08/04/1998  . Unspecified SARS-COV-2 Vaccination 06/01/2021  . Zoster, Live 01/16/2004   Pertinent  Health Maintenance Due  Topic Date Due  . HEMOGLOBIN A1C  08/25/2021  . OPHTHALMOLOGY EXAM  04/17/2022  . FOOT EXAM  08/20/2022  . INFLUENZA VACCINE  Completed       12/05/2021    8:00 AM 12/05/2021   10:38 PM 12/06/2021    7:05 AM 12/06/2021    7:40 PM 12/07/2021    8:45 AM  Fall Risk  Patient Fall Risk Level High fall risk Moderate fall risk High fall risk High fall risk High fall risk   Functional Status Survey:    Vitals:   12/28/21 1100  BP: (!) 116/53  Pulse: 70  Resp: 16  Temp: (!) 97.2 F (36.2 C)  SpO2: 92%  Weight: 192 lb (87.1 kg)  Height: '5\' 10"'$  (1.778 m)   Body mass index is 27.55 kg/m. Physical Exam Constitutional:      Appearance: Normal appearance.  HENT:     Head: Normocephalic and atraumatic.     Mouth/Throat:     Mouth: Mucous membranes are moist.  Eyes:     Conjunctiva/sclera: Conjunctivae normal.  Cardiovascular:     Rate and Rhythm: Normal rate. Rhythm irregular.     Pulses: Normal pulses.     Heart sounds: Normal heart sounds.  Pulmonary:     Effort: Pulmonary effort is normal.     Breath sounds: Normal breath sounds.  Abdominal:     General: Bowel sounds are normal.     Palpations: Abdomen is soft.  Musculoskeletal:        General: No swelling. Normal range of motion.     Cervical back: Normal range of motion.  Skin:    General: Skin is warm and dry.     Findings: Bruising present.     Comments: Bruise on left eye  Neurological:     General: No focal deficit present.     Mental Status: He is alert and oriented to person, place, and time.  Psychiatric:        Mood and Affect: Mood normal.        Behavior: Behavior normal.        Thought Content: Thought content normal.        Judgment: Judgment normal.     Labs reviewed: Recent Labs    09/02/21 0311 09/03/21 0457 09/16/21 0949 12/04/21 0118 12/04/21 0500 12/06/21 0348  NA 138 137  --  137 136 132*  K 3.6 3.9  --  3.7 3.6 3.7  CL 109 105  --  106 105 103  CO2 25 25  --  '23 25 22  '$ GLUCOSE 125* 112*  --  186* 164* 166*  BUN 26* 22  --  26* 25* 26*  CREATININE 0.91 0.92  --  0.77 0.79 0.79  CALCIUM 10.0 10.5*  --  10.5* 9.8 9.8   MG 1.5* 1.6* 1.6  --   --   --    Recent Labs    02/25/21 1507  AST 15  ALT 11  ALKPHOS 66  BILITOT 0.8  PROT 6.9  ALBUMIN 3.8   Recent Labs    09/01/21 1139 12/04/21 0118 12/04/21 0500 12/06/21 0348 12/07/21 0623  WBC 7.4 12.3* 9.5 11.0* 10.6*  NEUTROABS 5.0 10.2*  --   --   --   HGB 14.0 12.0* 11.0* 10.4* 10.7*  HCT 41.7 34.8* 32.9* 30.8* 30.9*  MCV 94.3 92.3 93.7 93.1 93.1  PLT 134* 174 157 138* 139*   Lab Results  Component Value Date   TSH 0.76 12/11/2012   Lab Results  Component Value Date   HGBA1C 7.6 (H) 02/25/2021   Lab Results  Component Value Date   CHOL 131 02/25/2021   HDL 70.00 02/25/2021   LDLCALC 47 02/25/2021   TRIG 67.0 02/25/2021   CHOLHDL 2 02/25/2021    Significant Diagnostic Results in last 30 days:  CUP PACEART REMOTE DEVICE CHECK  Result Date: 12/14/2021 Scheduled remote reviewed. Normal device function.  Next remote 91 days. LA  CT CERVICAL SPINE WO CONTRAST  Result Date: 12/03/2021 CLINICAL DATA:  Fall on coumadin EXAM: CT CERVICAL SPINE WITHOUT CONTRAST TECHNIQUE: Multidetector CT imaging of the cervical spine was performed without intravenous contrast. Multiplanar CT image reconstructions were also generated. RADIATION DOSE REDUCTION: This exam was performed according to the departmental dose-optimization program which includes automated exposure control, adjustment of the mA and/or kV according to patient size and/or use of iterative reconstruction technique. COMPARISON:  11/10/2021 FINDINGS: Alignment: Normal Skull base and vertebrae: No acute fracture. No primary bone lesion or focal pathologic process. Soft tissues and spinal canal: No prevertebral fluid or swelling. No visible canal hematoma. Disc levels: Diffuse degenerative disc and facet disease. No visible disc herniation. Upper chest: No acute findings Other: None IMPRESSION: Diffuse degenerative disc and facet disease. No acute bony abnormality. Electronically Signed   By:  Rolm Baptise M.D.   On: 12/03/2021 21:20   CT MAXILLOFACIAL WO CONTRAST  Result Date: 12/03/2021 CLINICAL DATA:  Fall, on blood thinners EXAM: CT MAXILLOFACIAL WITHOUT CONTRAST TECHNIQUE: Multidetector CT imaging of the maxillofacial structures was performed. Multiplanar CT image reconstructions were also generated. RADIATION DOSE REDUCTION: This exam was performed according to the departmental dose-optimization program which includes automated exposure control, adjustment of the mA and/or kV according to patient size and/or use of iterative reconstruction technique. COMPARISON:  11/10/2021 FINDINGS: Osseous: Left nasal bone fractures are again noted, unchanged since prior study. Nasal septum is deviated to the left, likely on a chronic basis, stable since prior study. No acute facial fracture. Orbits: Negative. No traumatic or inflammatory finding. Sinuses: Evidence of prior surgery.  No air-fluid levels. Soft tissues: Soft tissue swelling over the left forehead. Limited intracranial: See head CT report IMPRESSION: Left nasal bone fractures are stable since prior study 11/10/2021. No acute facial or orbital fracture. Electronically Signed   By: Rolm Baptise M.D.   On: 12/03/2021 21:18   CT Head Wo  Contrast  Result Date: 12/03/2021 CLINICAL DATA:  Fall, on blood thinners EXAM: CT HEAD WITHOUT CONTRAST TECHNIQUE: Contiguous axial images were obtained from the base of the skull through the vertex without intravenous contrast. RADIATION DOSE REDUCTION: This exam was performed according to the departmental dose-optimization program which includes automated exposure control, adjustment of the mA and/or kV according to patient size and/or use of iterative reconstruction technique. COMPARISON:  11/10/2021 FINDINGS: Brain: There is atrophy and chronic small vessel disease changes. No acute intracranial abnormality. Specifically, no hemorrhage, hydrocephalus, mass lesion, acute infarction, or significant intracranial  injury. Vascular: No hyperdense vessel or unexpected calcification. Skull: No acute calvarial abnormality. Sinuses/Orbits: No acute findings Other: Soft tissue swelling over the left forehead. IMPRESSION: Atrophy, chronic microvascular disease. No acute intracranial abnormality. Electronically Signed   By: Rolm Baptise M.D.   On: 12/03/2021 21:16   DG Humerus Right  Result Date: 12/03/2021 CLINICAL DATA:  Fall, right shoulder pain EXAM: RIGHT HUMERUS - 2+ VIEW COMPARISON:  Right shoulder series FINDINGS: Fracture seen in the proximal right humerus involving the greater tuberosity and likely the humeral neck. No subluxation or dislocation. No additional humeral abnormality. IMPRESSION: Proximal right humeral fracture involving the greater tuberosity likely humeral neck. Electronically Signed   By: Rolm Baptise M.D.   On: 12/03/2021 21:12   DG Shoulder Right  Result Date: 12/03/2021 CLINICAL DATA:  Fall, right shoulder pain EXAM: RIGHT SHOULDER - 2+ VIEW COMPARISON:  None Available. FINDINGS: Fracture noted through the proximal right humerus in the region of the greater tuberosity. Probable humeral neck fracture. No subluxation or dislocation. Degenerative changes in the right AC joint. IMPRESSION: Proximal right humeral fracture involving the greater tuberosity and likely humeral neck. Electronically Signed   By: Rolm Baptise M.D.   On: 12/03/2021 21:11   DG Elbow 2 Views Right  Result Date: 12/03/2021 CLINICAL DATA:  Fall EXAM: RIGHT ELBOW - 2 VIEW COMPARISON:  None Available. FINDINGS: Degenerative changes in the right elbow with joint space narrowing and spurring. No acute bony abnormality. Specifically, no fracture, subluxation, or dislocation. No joint effusion. IMPRESSION: No acute bony abnormality. Electronically Signed   By: Rolm Baptise M.D.   On: 12/03/2021 21:10    Assessment/Plan There are no diagnoses linked to this encounter.   Family/ staff Communication:   Labs/tests ordered:

## 2022-01-07 DIAGNOSIS — M1711 Unilateral primary osteoarthritis, right knee: Secondary | ICD-10-CM | POA: Diagnosis not present

## 2022-01-07 DIAGNOSIS — M25461 Effusion, right knee: Secondary | ICD-10-CM | POA: Diagnosis not present

## 2022-01-07 DIAGNOSIS — G8929 Other chronic pain: Secondary | ICD-10-CM | POA: Diagnosis not present

## 2022-01-07 MED ORDER — WARFARIN SODIUM 5 MG PO TABS: 5.0000 mg | ORAL_TABLET | Freq: Every day | ORAL | Status: DC

## 2022-01-08 DIAGNOSIS — I4891 Unspecified atrial fibrillation: Secondary | ICD-10-CM | POA: Diagnosis not present

## 2022-01-08 DIAGNOSIS — M19122 Post-traumatic osteoarthritis, left elbow: Secondary | ICD-10-CM | POA: Diagnosis not present

## 2022-01-08 DIAGNOSIS — D696 Thrombocytopenia, unspecified: Secondary | ICD-10-CM | POA: Diagnosis not present

## 2022-01-08 DIAGNOSIS — R262 Difficulty in walking, not elsewhere classified: Secondary | ICD-10-CM | POA: Diagnosis not present

## 2022-01-08 DIAGNOSIS — I442 Atrioventricular block, complete: Secondary | ICD-10-CM | POA: Diagnosis not present

## 2022-01-08 DIAGNOSIS — R278 Other lack of coordination: Secondary | ICD-10-CM | POA: Diagnosis not present

## 2022-01-08 DIAGNOSIS — R2689 Other abnormalities of gait and mobility: Secondary | ICD-10-CM | POA: Diagnosis not present

## 2022-01-08 DIAGNOSIS — Z9181 History of falling: Secondary | ICD-10-CM | POA: Diagnosis not present

## 2022-01-08 DIAGNOSIS — I495 Sick sinus syndrome: Secondary | ICD-10-CM | POA: Diagnosis not present

## 2022-01-08 DIAGNOSIS — S42201D Unspecified fracture of upper end of right humerus, subsequent encounter for fracture with routine healing: Secondary | ICD-10-CM | POA: Diagnosis not present

## 2022-01-08 DIAGNOSIS — M6281 Muscle weakness (generalized): Secondary | ICD-10-CM | POA: Diagnosis not present

## 2022-01-08 DIAGNOSIS — E1140 Type 2 diabetes mellitus with diabetic neuropathy, unspecified: Secondary | ICD-10-CM | POA: Diagnosis not present

## 2022-01-08 DIAGNOSIS — R2681 Unsteadiness on feet: Secondary | ICD-10-CM | POA: Diagnosis not present

## 2022-01-08 DIAGNOSIS — S42402D Unspecified fracture of lower end of left humerus, subsequent encounter for fracture with routine healing: Secondary | ICD-10-CM | POA: Diagnosis not present

## 2022-01-08 DIAGNOSIS — Z950 Presence of cardiac pacemaker: Secondary | ICD-10-CM | POA: Diagnosis not present

## 2022-01-08 DIAGNOSIS — M7022 Olecranon bursitis, left elbow: Secondary | ICD-10-CM | POA: Diagnosis not present

## 2022-01-08 LAB — POCT INR: INR: 2.1 — AB (ref 0.80–1.20)

## 2022-01-08 LAB — PROTIME-INR: Protime: 21.5 — AB (ref 10.0–13.8)

## 2022-01-10 ENCOUNTER — Other Ambulatory Visit: Payer: Self-pay | Admitting: Student

## 2022-01-10 ENCOUNTER — Non-Acute Institutional Stay (SKILLED_NURSING_FACILITY): Payer: Medicare Other | Admitting: Student

## 2022-01-10 ENCOUNTER — Encounter: Payer: Self-pay | Admitting: Student

## 2022-01-10 DIAGNOSIS — I482 Chronic atrial fibrillation, unspecified: Secondary | ICD-10-CM | POA: Diagnosis not present

## 2022-01-10 DIAGNOSIS — I4892 Unspecified atrial flutter: Secondary | ICD-10-CM

## 2022-01-10 DIAGNOSIS — G459 Transient cerebral ischemic attack, unspecified: Secondary | ICD-10-CM | POA: Diagnosis not present

## 2022-01-10 DIAGNOSIS — S42402D Unspecified fracture of lower end of left humerus, subsequent encounter for fracture with routine healing: Secondary | ICD-10-CM

## 2022-01-10 DIAGNOSIS — I10 Essential (primary) hypertension: Secondary | ICD-10-CM

## 2022-01-10 DIAGNOSIS — S42209A Unspecified fracture of upper end of unspecified humerus, initial encounter for closed fracture: Secondary | ICD-10-CM

## 2022-01-10 DIAGNOSIS — M19122 Post-traumatic osteoarthritis, left elbow: Secondary | ICD-10-CM | POA: Diagnosis not present

## 2022-01-10 DIAGNOSIS — S42201D Unspecified fracture of upper end of right humerus, subsequent encounter for fracture with routine healing: Secondary | ICD-10-CM | POA: Diagnosis not present

## 2022-01-10 DIAGNOSIS — I495 Sick sinus syndrome: Secondary | ICD-10-CM | POA: Diagnosis not present

## 2022-01-10 DIAGNOSIS — Z7189 Other specified counseling: Secondary | ICD-10-CM | POA: Diagnosis not present

## 2022-01-10 DIAGNOSIS — I700 Atherosclerosis of aorta: Secondary | ICD-10-CM

## 2022-01-10 DIAGNOSIS — I639 Cerebral infarction, unspecified: Secondary | ICD-10-CM | POA: Diagnosis not present

## 2022-01-10 DIAGNOSIS — E782 Mixed hyperlipidemia: Secondary | ICD-10-CM

## 2022-01-10 DIAGNOSIS — E1140 Type 2 diabetes mellitus with diabetic neuropathy, unspecified: Secondary | ICD-10-CM | POA: Diagnosis not present

## 2022-01-10 DIAGNOSIS — R2681 Unsteadiness on feet: Secondary | ICD-10-CM | POA: Diagnosis not present

## 2022-01-10 DIAGNOSIS — R278 Other lack of coordination: Secondary | ICD-10-CM | POA: Diagnosis not present

## 2022-01-10 DIAGNOSIS — D696 Thrombocytopenia, unspecified: Secondary | ICD-10-CM | POA: Diagnosis not present

## 2022-01-10 DIAGNOSIS — I442 Atrioventricular block, complete: Secondary | ICD-10-CM

## 2022-01-10 DIAGNOSIS — I5032 Chronic diastolic (congestive) heart failure: Secondary | ICD-10-CM

## 2022-01-10 DIAGNOSIS — R2689 Other abnormalities of gait and mobility: Secondary | ICD-10-CM | POA: Diagnosis not present

## 2022-01-10 DIAGNOSIS — R262 Difficulty in walking, not elsewhere classified: Secondary | ICD-10-CM | POA: Diagnosis not present

## 2022-01-10 DIAGNOSIS — M7022 Olecranon bursitis, left elbow: Secondary | ICD-10-CM | POA: Diagnosis not present

## 2022-01-10 DIAGNOSIS — M6281 Muscle weakness (generalized): Secondary | ICD-10-CM | POA: Diagnosis not present

## 2022-01-10 DIAGNOSIS — Z950 Presence of cardiac pacemaker: Secondary | ICD-10-CM | POA: Diagnosis not present

## 2022-01-10 DIAGNOSIS — Z9181 History of falling: Secondary | ICD-10-CM | POA: Diagnosis not present

## 2022-01-10 MED ORDER — APIXABAN 5 MG PO TABS: 5.0000 mg | ORAL_TABLET | Freq: Two times a day (BID) | ORAL | Status: DC

## 2022-01-10 MED ORDER — APIXABAN 5 MG PO TABS: 5.0000 mg | ORAL_TABLET | Freq: Two times a day (BID) | ORAL | 0 refills | Status: DC

## 2022-01-10 NOTE — Progress Notes (Signed)
Location:  Other [facility].  Nursing Home Room Number: [place]:  SNF [phone]) Provider:  Dr. Amada Kingfisher, MD  Patient Care Team: Dewayne Shorter, MD as PCP - General (Family Medicine) Rockey Situ Kathlene November, MD as PCP - Cardiology (Cardiology) Minna Merritts, MD as Consulting Physician (Cardiology)  Extended Emergency Contact Information Primary Emergency Contact: [place] Phone: [phone] Relation: Son  Code Status:  DNR Goals of care: Advanced Directive information    01/10/2022   11:42 AM  Advanced Directives  Does Patient Have a Medical Advance Directive? Yes  Type of Paramedic of [place];Out of facility DNR (pink MOST or yellow form);Living will  Does patient want to make changes to medical advance directive? No - Patient declined  Copy of [place] in Chart? Yes - validated most recent copy scanned in chart (See row information)     Chief Complaint  Patient presents with   Acute Visit    Anticoagulation.     HPI:  Pt is a 89 y.o. male seen today for an acute and routine follow up visit. Patient has had erratic INR levels since arrival to the facility. He would like to get on a different medication, but the pricing he was told was too expensive. Denies other issues at this time, "sometimes it takes a long time for urinal to be given to him." He has been cleared for movement of his non-dominant hand (left). No longer wearing stents on bilateral upper extremities. He has a visitor friend from church present during conversation.    Past Medical History:  Diagnosis Date   Asthma    Atrial flutter ([place]) 2007   Band keratopathy    Benign prostatic hypertrophy    Chronic ear infection    Chronic kidney disease    Chronic prostatitis    Dental bridge present    permanent - upper   Diabetes mellitus    Elbow stiffness, left    s/p fracture many yrs ago.  arm  does not straighten   GERD (gastroesophageal reflux disease)    laryngeal involvement   Hyperlipidemia    Hypertension    Obstructive sleep apnea    CPAP-9   Osteoarthrosis, localized, primary, knee    post-traumatic   Prostatitis    Stroke ([place])    TIA   Tachy-brady syndrome ([place])    UTI (urinary tract infection)    Past Surgical History:  Procedure Laterality Date   APPENDECTOMY     BELPHAROPTOSIS REPAIR     Dr [name]---didn't resolve weepy eye and eyelid drooping   CARDIOVERSION  04/13/2010   CATARACT EXTRACTION, BILATERAL  2009   Chest pain  8/12   Stress test benign   ESOPHAGEAL DILATION  05/26/2016   Procedure: ESOPHAGEAL DILATION;  Surgeon: Lucilla Lame, MD;  Location: [facility];  Service: Endoscopy;;   ESOPHAGOGASTRODUODENOSCOPY (EGD) WITH PROPOFOL N/A 05/26/2016   Procedure: ESOPHAGOGASTRODUODENOSCOPY (EGD) WITH PROPOFOL;  Surgeon: Lucilla Lame, MD;  Location: [place] [facility];  Service: Endoscopy;  Laterality: N/A;  Diabetic - oral meds sleep apnea   EYE SURGERY     FRACTURE SURGERY Left    elbow   KNEE SURGERY  1998   plate after fracture, then removed for infection   left elbow surgery     MASTOIDECTOMY  8/08   Dr Idelle Crouch   PACEMAKER IMPLANT N/A 09/02/2021   Procedure: PACEMAKER IMPLANT;  Surgeon: Vickie Epley, MD;  Location:  [facility];  Service: Cardiovascular;  Laterality: N/A;   RHINOPLASTY  5/10   and septoplasty   SUBACROMIAL DECOMPRESSION Right 2005   Arthroscopic (for rotator cuff and biceps tendon ruptures)   TEAR DUCT PROBING  11/13   Dr Vickki Muff   TOTAL KNEE ARTHROPLASTY Left 09/01/2014   Procedure: TOTAL KNEE ARTHROPLASTY;  Surgeon: Dereck Leep, MD;  Location: [facility];  Service: Orthopedics;  Laterality: Left;   TRIGGER FINGER RELEASE Left 02/25/2015   Procedure: LEFT LONG TRIGGER RELEASE;  Surgeon: Dereck Leep, MD;  Location: [facility];  Service: Orthopedics;  Laterality: Left;   VENOUS ABLATION      Allergies   Allergen Reactions   Lovenox [Enoxaparin] Hives   Proscar [Finasteride] Other (See Comments)    Upper body and arm weakness   Latex Rash    Has trouble with BANDAIDS that have been left on for more than 24 hours. Prefers paper tape.   Nickel Rash    Localized rash   Percocet [Oxycodone-Acetaminophen] Rash   Tape Rash and Other (See Comments)    Sensitivity     Outpatient Encounter Medications as of 01/10/2022  Medication Sig   acetaminophen (TYLENOL) 500 MG tablet Take 1,000 mg by mouth every 8 (eight) hours as needed.   APPLE CIDER VINEGAR PO Take 1 capsule by mouth at bedtime.   atorvastatin (LIPITOR) 80 MG tablet Take 1 tablet (80 mg total) by mouth daily.   cetirizine (ZYRTEC) 5 MG tablet Take 5 mg by mouth daily.   Cranberry 250 MG TABS Take 1 tablet by mouth 2 (two) times daily.   Cyanocobalamin (B-12 PO) Place 1 tablet under the tongue daily.   docusate sodium (COLACE) 100 MG capsule Take 100 mg by mouth 2 (two) times daily.   finasteride (PROSCAR) 5 MG tablet Take 1 tablet (5 mg total) by mouth daily.   lidocaine (LIDODERM) 5 % Place 1 patch onto the skin daily. Remove & Discard patch within 12 hours or as directed by MD   Magnesium Oxide 400 MG CAPS Take 1 capsule (400 mg total) by mouth daily.   metFORMIN (GLUCOPHAGE) 500 MG tablet TAKE 1 TABLET BY MOUTH  TWICE DAILY WITH MEALS   methocarbamol (ROBAXIN) 500 MG tablet Take 1 tablet (500 mg total) by mouth every 8 (eight) hours as needed for muscle spasms.   Polyethyl Glycol-Propyl Glycol (SYSTANE ULTRA OP) Place 1 drop into both eyes daily as needed (dry eyes).   senna (SENOKOT) 8.6 MG TABS tablet Take 1 tablet (8.6 mg total) by mouth daily.   silver sulfADIAZINE (SILVADENE) 1 % cream Apply 1 Application topically daily.   tamsulosin (FLOMAX) 0.4 MG CAPS capsule Take 0.4 mg by mouth at bedtime.   traMADol (ULTRAM) 50 MG tablet Take 1 tablet (50 mg total) by mouth every 6 (six) hours as needed for moderate pain.    triamterene-hydrochlorothiazide (DYAZIDE) 37.5-25 MG capsule TAKE 1 CAPSULE BY MOUTH IN  THE MORNING   warfarin (COUMADIN) 3 MG tablet Take 3 mg by mouth daily.   [DISCONTINUED] tamsulosin (FLOMAX) 0.4 MG CAPS capsule Take 2 capsules (0.8 mg total) by mouth at bedtime. (Patient taking differently: Take 0.4 mg by mouth at bedtime.)   [DISCONTINUED] warfarin (COUMADIN) 5 MG tablet Take 1 tablet (5 mg total) by mouth daily.   No facility-administered encounter medications on file as of 01/10/2022.    Review of Systems  Immunization History  Administered Date(s) Administered   Fluad Quad(high Dose 65+) 09/14/2018  H1N1 12/13/2007   Influenza Split 09/11/2010   Influenza Whole 09/12/2011   Influenza, High Dose Seasonal PF 09/20/2012, 09/07/2017   Influenza, Seasonal, Injecte, Preservative Fre 09/08/2007, 09/26/2008, 10/02/2014, 09/18/2015   Influenza,inj,Quad PF,6+ Mos 09/19/2013, 09/21/2015   Influenza-Unspecified 09/07/2017, 09/11/2019, 09/03/2020, 10/03/2021   Moderna Sars-Covid-2 Vaccination 01/18/2019, 02/15/2019, 06/01/2021   Pneumococcal Conjugate-13 09/19/2013   Pneumococcal Polysaccharide-23 09/26/2003, 06/28/2016   Tdap 06/04/2010, 08/15/2021   Tetanus 08/04/1998   Unspecified SARS-COV-2 Vaccination 06/01/2021   Zoster, Live 01/16/2004   Pertinent  Health Maintenance Due  Topic Date Due   HEMOGLOBIN A1C  08/25/2021   OPHTHALMOLOGY EXAM  04/17/2022   FOOT EXAM  08/20/2022   INFLUENZA VACCINE  Completed      12/05/2021    8:00 AM 12/05/2021   10:38 PM 12/06/2021    7:05 AM 12/06/2021    7:40 PM 12/07/2021    8:45 AM  Fall Risk  Patient Fall Risk Level High fall risk Moderate fall risk High fall risk High fall risk High fall risk   Functional Status Survey:    Vitals:   01/10/22 1133  BP: (!) 109/58  Pulse: 68  Resp: 20  Temp: 97.6 F (36.4 C)  SpO2: 92%  Weight: 189 lb (85.7 kg)  Height: '5\' 10"'$  (1.778 m)   Body mass index is 27.12 kg/m. Physical Exam  Labs  reviewed: Recent Labs    09/02/21 0311 09/03/21 0457 09/16/21 0949 12/04/21 0118 12/04/21 0500 12/06/21 0348  NA 138 137  --  137 136 132*  K 3.6 3.9  --  3.7 3.6 3.7  CL 109 105  --  106 105 103  CO2 25 25  --  '23 25 22  '$ GLUCOSE 125* 112*  --  186* 164* 166*  BUN 26* 22  --  26* 25* 26*  CREATININE 0.91 0.92  --  0.77 0.79 0.79  CALCIUM 10.0 10.5*  --  10.5* 9.8 9.8  MG 1.5* 1.6* 1.6  --   --   --    Recent Labs    02/25/21 1507  AST 15  ALT 11  ALKPHOS 66  BILITOT 0.8  PROT 6.9  ALBUMIN 3.8   Recent Labs    09/01/21 1139 12/04/21 0118 12/04/21 0500 12/06/21 0348 12/07/21 0623  WBC 7.4 12.3* 9.5 11.0* 10.6*  NEUTROABS 5.0 10.2*  --   --   --   HGB 14.0 12.0* 11.0* 10.4* 10.7*  HCT 41.7 34.8* 32.9* 30.8* 30.9*  MCV 94.3 92.3 93.7 93.1 93.1  PLT 134* 174 157 138* 139*   Lab Results  Component Value Date   TSH 0.76 12/11/2012   Lab Results  Component Value Date   HGBA1C 7.6 (H) 02/25/2021   Lab Results  Component Value Date   CHOL 131 02/25/2021   HDL 70.00 02/25/2021   LDLCALC 47 02/25/2021   TRIG 67.0 02/25/2021   CHOLHDL 2 02/25/2021    Significant Diagnostic Results in last 30 days:  CUP PACEART REMOTE DEVICE CHECK  Result Date: 12/14/2021 Scheduled remote reviewed. Normal device function.  Next remote 91 days. LA   Assessment/Plan 1. Atrial fibrillation, chronic ([place]) 2. Encounter for anticoagulation discussion and counseling 3. Cerebrovascular accident (CVA), unspecified mechanism ([place]) 4. Complete heart block ([place]) 5. TIA (transient ischemic attack) 6. Atrial flutter, unspecified type ([place]) 7. Tachy-brady syndrome ([place]) 8. Aortic atherosclerosis [facility]) Patient has had difficulty with management of AC. Weekly INR has been supra therapeutic multiple times. Patient has declined transitioning to [place] due to  price. After discussion with pharmacist, patient states pricing will be okay for him to pay co-pay $45.30/mo. Will plan to discontinue  warfarin and start Apixiban 5.0 mg BID for anticoagulation. Rate well-controlled.   9. Chronic diastolic heart failure (HCC) Euvolemic on exam. Dry mucous membranes. Patient has lost 4 lbs since being in LTC, likely due to poor PO intake.   10. Essential hypertension BP well-controlled. Continue lipitor 80 mg daily. Continue Triamterene-HCTZ  11. Type 2 diabetes, controlled, with neuropathy (HCC) Continue Metformin  12. Closed fracture of proximal end of humerus, unspecified fracture morphology, unspecified laterality, initial encounter Injury 12/25/21. Continues to require therapy. Still limited ROM, however, no sling in place at this time. Continue Robaxin 500 mg TID PRN.   13. Closed fracture of left elbow with routine healing, subsequent encounter Injury 11/30/21 No longer in cast. Weight baring as tolerated. CTM  14. Mixed hyperlipidemia Continue Lipitor 80 mg daily.   15. BPH Continue proscar and flomax.   Family/ staff Communication: nursing  Labs/tests ordered:   none

## 2022-01-11 DIAGNOSIS — M6281 Muscle weakness (generalized): Secondary | ICD-10-CM | POA: Diagnosis not present

## 2022-01-11 DIAGNOSIS — Z9181 History of falling: Secondary | ICD-10-CM | POA: Diagnosis not present

## 2022-01-11 DIAGNOSIS — M19122 Post-traumatic osteoarthritis, left elbow: Secondary | ICD-10-CM | POA: Diagnosis not present

## 2022-01-11 DIAGNOSIS — D696 Thrombocytopenia, unspecified: Secondary | ICD-10-CM | POA: Diagnosis not present

## 2022-01-11 DIAGNOSIS — S42402D Unspecified fracture of lower end of left humerus, subsequent encounter for fracture with routine healing: Secondary | ICD-10-CM | POA: Diagnosis not present

## 2022-01-11 DIAGNOSIS — I495 Sick sinus syndrome: Secondary | ICD-10-CM | POA: Diagnosis not present

## 2022-01-11 DIAGNOSIS — E1140 Type 2 diabetes mellitus with diabetic neuropathy, unspecified: Secondary | ICD-10-CM | POA: Diagnosis not present

## 2022-01-11 DIAGNOSIS — R2681 Unsteadiness on feet: Secondary | ICD-10-CM | POA: Diagnosis not present

## 2022-01-11 DIAGNOSIS — R262 Difficulty in walking, not elsewhere classified: Secondary | ICD-10-CM | POA: Diagnosis not present

## 2022-01-11 DIAGNOSIS — I442 Atrioventricular block, complete: Secondary | ICD-10-CM | POA: Diagnosis not present

## 2022-01-11 DIAGNOSIS — R2689 Other abnormalities of gait and mobility: Secondary | ICD-10-CM | POA: Diagnosis not present

## 2022-01-11 DIAGNOSIS — R278 Other lack of coordination: Secondary | ICD-10-CM | POA: Diagnosis not present

## 2022-01-11 DIAGNOSIS — M7022 Olecranon bursitis, left elbow: Secondary | ICD-10-CM | POA: Diagnosis not present

## 2022-01-11 DIAGNOSIS — Z950 Presence of cardiac pacemaker: Secondary | ICD-10-CM | POA: Diagnosis not present

## 2022-01-11 DIAGNOSIS — S42201D Unspecified fracture of upper end of right humerus, subsequent encounter for fracture with routine healing: Secondary | ICD-10-CM | POA: Diagnosis not present

## 2022-01-12 DIAGNOSIS — I495 Sick sinus syndrome: Secondary | ICD-10-CM | POA: Diagnosis not present

## 2022-01-12 DIAGNOSIS — M19122 Post-traumatic osteoarthritis, left elbow: Secondary | ICD-10-CM | POA: Diagnosis not present

## 2022-01-12 DIAGNOSIS — Z9181 History of falling: Secondary | ICD-10-CM | POA: Diagnosis not present

## 2022-01-12 DIAGNOSIS — S42201D Unspecified fracture of upper end of right humerus, subsequent encounter for fracture with routine healing: Secondary | ICD-10-CM | POA: Diagnosis not present

## 2022-01-12 DIAGNOSIS — I442 Atrioventricular block, complete: Secondary | ICD-10-CM | POA: Diagnosis not present

## 2022-01-12 DIAGNOSIS — R2681 Unsteadiness on feet: Secondary | ICD-10-CM | POA: Diagnosis not present

## 2022-01-12 DIAGNOSIS — D696 Thrombocytopenia, unspecified: Secondary | ICD-10-CM | POA: Diagnosis not present

## 2022-01-12 DIAGNOSIS — S42402D Unspecified fracture of lower end of left humerus, subsequent encounter for fracture with routine healing: Secondary | ICD-10-CM | POA: Diagnosis not present

## 2022-01-12 DIAGNOSIS — M7022 Olecranon bursitis, left elbow: Secondary | ICD-10-CM | POA: Diagnosis not present

## 2022-01-12 DIAGNOSIS — E1140 Type 2 diabetes mellitus with diabetic neuropathy, unspecified: Secondary | ICD-10-CM | POA: Diagnosis not present

## 2022-01-12 DIAGNOSIS — R278 Other lack of coordination: Secondary | ICD-10-CM | POA: Diagnosis not present

## 2022-01-12 DIAGNOSIS — Z950 Presence of cardiac pacemaker: Secondary | ICD-10-CM | POA: Diagnosis not present

## 2022-01-12 DIAGNOSIS — R2689 Other abnormalities of gait and mobility: Secondary | ICD-10-CM | POA: Diagnosis not present

## 2022-01-12 DIAGNOSIS — R262 Difficulty in walking, not elsewhere classified: Secondary | ICD-10-CM | POA: Diagnosis not present

## 2022-01-12 DIAGNOSIS — M6281 Muscle weakness (generalized): Secondary | ICD-10-CM | POA: Diagnosis not present

## 2022-01-13 DIAGNOSIS — R278 Other lack of coordination: Secondary | ICD-10-CM | POA: Diagnosis not present

## 2022-01-13 DIAGNOSIS — Z950 Presence of cardiac pacemaker: Secondary | ICD-10-CM | POA: Diagnosis not present

## 2022-01-13 DIAGNOSIS — Z9181 History of falling: Secondary | ICD-10-CM | POA: Diagnosis not present

## 2022-01-13 DIAGNOSIS — M6281 Muscle weakness (generalized): Secondary | ICD-10-CM | POA: Diagnosis not present

## 2022-01-13 DIAGNOSIS — I4891 Unspecified atrial fibrillation: Secondary | ICD-10-CM | POA: Diagnosis not present

## 2022-01-13 DIAGNOSIS — I442 Atrioventricular block, complete: Secondary | ICD-10-CM | POA: Diagnosis not present

## 2022-01-13 DIAGNOSIS — R262 Difficulty in walking, not elsewhere classified: Secondary | ICD-10-CM | POA: Diagnosis not present

## 2022-01-13 DIAGNOSIS — M19122 Post-traumatic osteoarthritis, left elbow: Secondary | ICD-10-CM | POA: Diagnosis not present

## 2022-01-13 DIAGNOSIS — D696 Thrombocytopenia, unspecified: Secondary | ICD-10-CM | POA: Diagnosis not present

## 2022-01-13 DIAGNOSIS — R2681 Unsteadiness on feet: Secondary | ICD-10-CM | POA: Diagnosis not present

## 2022-01-13 DIAGNOSIS — S42201D Unspecified fracture of upper end of right humerus, subsequent encounter for fracture with routine healing: Secondary | ICD-10-CM | POA: Diagnosis not present

## 2022-01-13 DIAGNOSIS — M7022 Olecranon bursitis, left elbow: Secondary | ICD-10-CM | POA: Diagnosis not present

## 2022-01-13 DIAGNOSIS — R2689 Other abnormalities of gait and mobility: Secondary | ICD-10-CM | POA: Diagnosis not present

## 2022-01-13 DIAGNOSIS — E1140 Type 2 diabetes mellitus with diabetic neuropathy, unspecified: Secondary | ICD-10-CM | POA: Diagnosis not present

## 2022-01-13 DIAGNOSIS — I495 Sick sinus syndrome: Secondary | ICD-10-CM | POA: Diagnosis not present

## 2022-01-13 DIAGNOSIS — S42402D Unspecified fracture of lower end of left humerus, subsequent encounter for fracture with routine healing: Secondary | ICD-10-CM | POA: Diagnosis not present

## 2022-01-13 LAB — POCT INR: INR: 1.3 — AB (ref 0.80–1.20)

## 2022-01-13 LAB — PROTIME-INR: Protime: 12.6 (ref 10.0–13.8)

## 2022-01-14 DIAGNOSIS — M7022 Olecranon bursitis, left elbow: Secondary | ICD-10-CM | POA: Diagnosis not present

## 2022-01-14 DIAGNOSIS — R2689 Other abnormalities of gait and mobility: Secondary | ICD-10-CM | POA: Diagnosis not present

## 2022-01-14 DIAGNOSIS — Z9181 History of falling: Secondary | ICD-10-CM | POA: Diagnosis not present

## 2022-01-14 DIAGNOSIS — M6281 Muscle weakness (generalized): Secondary | ICD-10-CM | POA: Diagnosis not present

## 2022-01-14 DIAGNOSIS — I442 Atrioventricular block, complete: Secondary | ICD-10-CM | POA: Diagnosis not present

## 2022-01-14 DIAGNOSIS — I495 Sick sinus syndrome: Secondary | ICD-10-CM | POA: Diagnosis not present

## 2022-01-14 DIAGNOSIS — M19122 Post-traumatic osteoarthritis, left elbow: Secondary | ICD-10-CM | POA: Diagnosis not present

## 2022-01-14 DIAGNOSIS — S42201D Unspecified fracture of upper end of right humerus, subsequent encounter for fracture with routine healing: Secondary | ICD-10-CM | POA: Diagnosis not present

## 2022-01-14 DIAGNOSIS — E1140 Type 2 diabetes mellitus with diabetic neuropathy, unspecified: Secondary | ICD-10-CM | POA: Diagnosis not present

## 2022-01-14 DIAGNOSIS — R278 Other lack of coordination: Secondary | ICD-10-CM | POA: Diagnosis not present

## 2022-01-14 DIAGNOSIS — R2681 Unsteadiness on feet: Secondary | ICD-10-CM | POA: Diagnosis not present

## 2022-01-14 DIAGNOSIS — D696 Thrombocytopenia, unspecified: Secondary | ICD-10-CM | POA: Diagnosis not present

## 2022-01-14 DIAGNOSIS — Z950 Presence of cardiac pacemaker: Secondary | ICD-10-CM | POA: Diagnosis not present

## 2022-01-14 DIAGNOSIS — R262 Difficulty in walking, not elsewhere classified: Secondary | ICD-10-CM | POA: Diagnosis not present

## 2022-01-14 DIAGNOSIS — S42402D Unspecified fracture of lower end of left humerus, subsequent encounter for fracture with routine healing: Secondary | ICD-10-CM | POA: Diagnosis not present

## 2022-01-15 DIAGNOSIS — S42402D Unspecified fracture of lower end of left humerus, subsequent encounter for fracture with routine healing: Secondary | ICD-10-CM | POA: Diagnosis not present

## 2022-01-15 DIAGNOSIS — Z950 Presence of cardiac pacemaker: Secondary | ICD-10-CM | POA: Diagnosis not present

## 2022-01-15 DIAGNOSIS — E1140 Type 2 diabetes mellitus with diabetic neuropathy, unspecified: Secondary | ICD-10-CM | POA: Diagnosis not present

## 2022-01-15 DIAGNOSIS — I495 Sick sinus syndrome: Secondary | ICD-10-CM | POA: Diagnosis not present

## 2022-01-15 DIAGNOSIS — S42201D Unspecified fracture of upper end of right humerus, subsequent encounter for fracture with routine healing: Secondary | ICD-10-CM | POA: Diagnosis not present

## 2022-01-15 DIAGNOSIS — R2681 Unsteadiness on feet: Secondary | ICD-10-CM | POA: Diagnosis not present

## 2022-01-15 DIAGNOSIS — M6281 Muscle weakness (generalized): Secondary | ICD-10-CM | POA: Diagnosis not present

## 2022-01-15 DIAGNOSIS — M7022 Olecranon bursitis, left elbow: Secondary | ICD-10-CM | POA: Diagnosis not present

## 2022-01-15 DIAGNOSIS — R262 Difficulty in walking, not elsewhere classified: Secondary | ICD-10-CM | POA: Diagnosis not present

## 2022-01-15 DIAGNOSIS — R278 Other lack of coordination: Secondary | ICD-10-CM | POA: Diagnosis not present

## 2022-01-15 DIAGNOSIS — D696 Thrombocytopenia, unspecified: Secondary | ICD-10-CM | POA: Diagnosis not present

## 2022-01-15 DIAGNOSIS — Z9181 History of falling: Secondary | ICD-10-CM | POA: Diagnosis not present

## 2022-01-15 DIAGNOSIS — M19122 Post-traumatic osteoarthritis, left elbow: Secondary | ICD-10-CM | POA: Diagnosis not present

## 2022-01-15 DIAGNOSIS — I442 Atrioventricular block, complete: Secondary | ICD-10-CM | POA: Diagnosis not present

## 2022-01-15 DIAGNOSIS — R2689 Other abnormalities of gait and mobility: Secondary | ICD-10-CM | POA: Diagnosis not present

## 2022-01-17 ENCOUNTER — Non-Acute Institutional Stay (SKILLED_NURSING_FACILITY): Payer: Medicare Other | Admitting: Student

## 2022-01-17 ENCOUNTER — Encounter: Payer: Self-pay | Admitting: Student

## 2022-01-17 DIAGNOSIS — S42291D Other displaced fracture of upper end of right humerus, subsequent encounter for fracture with routine healing: Secondary | ICD-10-CM | POA: Diagnosis not present

## 2022-01-17 DIAGNOSIS — M7022 Olecranon bursitis, left elbow: Secondary | ICD-10-CM | POA: Diagnosis not present

## 2022-01-17 DIAGNOSIS — Z7901 Long term (current) use of anticoagulants: Secondary | ICD-10-CM

## 2022-01-17 DIAGNOSIS — R2689 Other abnormalities of gait and mobility: Secondary | ICD-10-CM | POA: Diagnosis not present

## 2022-01-17 DIAGNOSIS — I442 Atrioventricular block, complete: Secondary | ICD-10-CM | POA: Diagnosis not present

## 2022-01-17 DIAGNOSIS — R2681 Unsteadiness on feet: Secondary | ICD-10-CM | POA: Diagnosis not present

## 2022-01-17 DIAGNOSIS — M19122 Post-traumatic osteoarthritis, left elbow: Secondary | ICD-10-CM | POA: Diagnosis not present

## 2022-01-17 DIAGNOSIS — I495 Sick sinus syndrome: Secondary | ICD-10-CM | POA: Diagnosis not present

## 2022-01-17 DIAGNOSIS — Z7189 Other specified counseling: Secondary | ICD-10-CM | POA: Diagnosis not present

## 2022-01-17 DIAGNOSIS — R278 Other lack of coordination: Secondary | ICD-10-CM | POA: Diagnosis not present

## 2022-01-17 DIAGNOSIS — Z950 Presence of cardiac pacemaker: Secondary | ICD-10-CM | POA: Diagnosis not present

## 2022-01-17 DIAGNOSIS — S42402D Unspecified fracture of lower end of left humerus, subsequent encounter for fracture with routine healing: Secondary | ICD-10-CM | POA: Diagnosis not present

## 2022-01-17 DIAGNOSIS — S51011S Laceration without foreign body of right elbow, sequela: Secondary | ICD-10-CM | POA: Diagnosis not present

## 2022-01-17 DIAGNOSIS — R262 Difficulty in walking, not elsewhere classified: Secondary | ICD-10-CM | POA: Diagnosis not present

## 2022-01-17 DIAGNOSIS — E1140 Type 2 diabetes mellitus with diabetic neuropathy, unspecified: Secondary | ICD-10-CM | POA: Diagnosis not present

## 2022-01-17 DIAGNOSIS — S52022D Displaced fracture of olecranon process without intraarticular extension of left ulna, subsequent encounter for closed fracture with routine healing: Secondary | ICD-10-CM | POA: Diagnosis not present

## 2022-01-17 DIAGNOSIS — S42201D Unspecified fracture of upper end of right humerus, subsequent encounter for fracture with routine healing: Secondary | ICD-10-CM | POA: Diagnosis not present

## 2022-01-17 DIAGNOSIS — D696 Thrombocytopenia, unspecified: Secondary | ICD-10-CM | POA: Diagnosis not present

## 2022-01-17 DIAGNOSIS — M6281 Muscle weakness (generalized): Secondary | ICD-10-CM | POA: Diagnosis not present

## 2022-01-17 DIAGNOSIS — R319 Hematuria, unspecified: Secondary | ICD-10-CM | POA: Diagnosis not present

## 2022-01-17 DIAGNOSIS — Z9181 History of falling: Secondary | ICD-10-CM | POA: Diagnosis not present

## 2022-01-17 NOTE — Progress Notes (Addendum)
Location:  Other [facility].  Nursing Home Room Number: [place]:  SNF [phone]) Provider:  Dewayne Shorter, MD  Patient Care Team: Dewayne Shorter, MD as PCP - General (Family Medicine) Minna Merritts, MD as PCP - Cardiology (Cardiology) Minna Merritts, MD as Consulting Physician (Cardiology)  Extended Emergency Contact Information Primary Emergency Contact: [place] Phone: [phone] Relation: Son  Code Status:  DNR Goals of care: Advanced Directive information    01/17/2022    3:21 PM  Advanced Directives  Does Patient Have a Medical Advance Directive? Yes  Type of Paramedic of [place];Out of facility DNR (pink MOST or yellow form)  Does patient want to make changes to medical advance directive? No - Patient declined  Copy of [place] in Chart? Yes - validated most recent copy scanned in chart (See row information)     Chief Complaint  Patient presents with   Acute Visit    Wound Check.     HPI:  Pt is a 89 y.o. male seen today for an acute visit for elbow wound. Patient states he has no concerns about the wound since it has been there for weeks since he arrived to healthcare. He does have concern about the fact that he has had frank hematuria today for the first time. Denies dysuria, abdominal pain, fevers, chills, n/v.   Past Medical History:  Diagnosis Date   Asthma    Atrial flutter ([place]) 2007   Band keratopathy    Benign prostatic hypertrophy    Chronic ear infection    Chronic kidney disease    Chronic prostatitis    Dental bridge present    permanent - upper   Diabetes mellitus    Elbow stiffness, left    s/p fracture many yrs ago.  arm does not straighten   GERD (gastroesophageal reflux disease)    laryngeal involvement   Hyperlipidemia    Hypertension    Obstructive sleep apnea    CPAP-9   Osteoarthrosis, localized, primary, knee    post-traumatic    Prostatitis    Stroke ([place])    TIA   Tachy-brady syndrome ([place])    UTI (urinary tract infection)    Past Surgical History:  Procedure Laterality Date   APPENDECTOMY     BELPHAROPTOSIS REPAIR     Dr [name]---didn't resolve weepy eye and eyelid drooping   CARDIOVERSION  04/13/2010   CATARACT EXTRACTION, BILATERAL  2009   Chest pain  8/12   Stress test benign   ESOPHAGEAL DILATION  05/26/2016   Procedure: ESOPHAGEAL DILATION;  Surgeon: Lucilla Lame, MD;  Location: [place];  Service: Endoscopy;;   ESOPHAGOGASTRODUODENOSCOPY (EGD) WITH PROPOFOL N/A 05/26/2016   Procedure: ESOPHAGOGASTRODUODENOSCOPY (EGD) WITH PROPOFOL;  Surgeon: Lucilla Lame, MD;  Location: [place];  Service: Endoscopy;  Laterality: N/A;  Diabetic - oral meds sleep apnea   EYE SURGERY     FRACTURE SURGERY Left    elbow   KNEE SURGERY  1998   plate after fracture, then removed for infection   left elbow surgery     MASTOIDECTOMY  8/08   Dr Idelle Crouch   PACEMAKER IMPLANT N/A 09/02/2021   Procedure: PACEMAKER IMPLANT;  Surgeon: Vickie Epley, MD;  Location: [place] CV LAB;  Service: Cardiovascular;  Laterality: N/A;   RHINOPLASTY  5/10   and septoplasty   SUBACROMIAL DECOMPRESSION Right 2005   Arthroscopic (for rotator cuff and biceps tendon ruptures)  TEAR DUCT PROBING  11/13   Dr Vickki Muff   TOTAL KNEE ARTHROPLASTY Left 09/01/2014   Procedure: TOTAL KNEE ARTHROPLASTY;  Surgeon: Dereck Leep, MD;  Location: [facility];  Service: Orthopedics;  Laterality: Left;   TRIGGER FINGER RELEASE Left 02/25/2015   Procedure: LEFT LONG TRIGGER RELEASE;  Surgeon: Dereck Leep, MD;  Location: [facility];  Service: Orthopedics;  Laterality: Left;   VENOUS ABLATION      Allergies  Allergen Reactions   Lovenox [Enoxaparin] Hives   Proscar [Finasteride] Other (See Comments)    Upper body and arm weakness   Latex Rash    Has trouble with BANDAIDS that have been left on for more than 24 hours. Prefers  paper tape.   Nickel Rash    Localized rash   Percocet [Oxycodone-Acetaminophen] Rash   Tape Rash and Other (See Comments)    Sensitivity     Outpatient Encounter Medications as of 01/17/2022  Medication Sig   acetaminophen (TYLENOL) 500 MG tablet Take 1,000 mg by mouth every 8 (eight) hours as needed.   apixaban (ELIQUIS) 2.5 MG TABS tablet Take 2.5 mg by mouth 2 (two) times daily.   APPLE CIDER VINEGAR PO Take 1 capsule by mouth at bedtime.   atorvastatin (LIPITOR) 80 MG tablet Take 1 tablet (80 mg total) by mouth daily.   cetirizine (ZYRTEC) 5 MG tablet Take 5 mg by mouth daily.   Cranberry 250 MG TABS Take 1 tablet by mouth 2 (two) times daily.   Cyanocobalamin (B-12 PO) Take 1,000 mg by mouth daily.   docusate sodium (COLACE) 100 MG capsule Take 100 mg by mouth 2 (two) times daily.   finasteride (PROSCAR) 5 MG tablet Take 1 tablet (5 mg total) by mouth daily.   lidocaine (LIDODERM) 5 % Place 1 patch onto the skin daily. Remove & Discard patch within 12 hours or as directed by MD   Magnesium Oxide 400 MG CAPS Take 1 capsule (400 mg total) by mouth daily.   metFORMIN (GLUCOPHAGE) 500 MG tablet TAKE 1 TABLET BY MOUTH  TWICE DAILY WITH MEALS   methocarbamol (ROBAXIN) 500 MG tablet Take 1 tablet (500 mg total) by mouth every 8 (eight) hours as needed for muscle spasms.   Polyethyl Glycol-Propyl Glycol (SYSTANE ULTRA OP) Place 1 drop into both eyes daily as needed (dry eyes).   senna (SENOKOT) 8.6 MG TABS tablet Take 1 tablet (8.6 mg total) by mouth daily.   silver sulfADIAZINE (SILVADENE) 1 % cream Apply 1 Application topically daily.   tamsulosin (FLOMAX) 0.4 MG CAPS capsule Take 0.4 mg by mouth at bedtime.   traMADol (ULTRAM) 50 MG tablet Take 1 tablet (50 mg total) by mouth every 6 (six) hours as needed for moderate pain.   triamterene-hydrochlorothiazide (DYAZIDE) 37.5-25 MG capsule TAKE 1 CAPSULE BY MOUTH IN  THE MORNING   [DISCONTINUED] apixaban (ELIQUIS) 5 MG TABS tablet Take 1  tablet (5 mg total) by mouth 2 (two) times daily.   No facility-administered encounter medications on file as of 01/17/2022.    Review of Systems  All other systems reviewed and are negative.   Immunization History  Administered Date(s) Administered   Fluad Quad(high Dose 65+) 09/14/2018   H1N1 12/13/2007   Influenza Split 09/11/2010   Influenza Whole 09/12/2011   Influenza, High Dose Seasonal PF 09/20/2012, 09/07/2017   Influenza, Seasonal, Injecte, Preservative Fre 09/08/2007, 09/26/2008, 10/02/2014, 09/18/2015   Influenza,inj,Quad PF,6+ Mos 09/19/2013, 09/21/2015   Influenza-Unspecified 09/07/2017, 09/11/2019, 09/03/2020, 10/19/2021   Moderna  Sars-Covid-2 Vaccination 01/18/2019, 02/15/2019, 10/17/2020, 06/01/2021   Pneumococcal Conjugate-13 09/19/2013   Pneumococcal Polysaccharide-23 09/26/2003, 06/28/2016   Td 08/04/1998   Tdap 06/04/2010, 08/15/2021   Tetanus 08/04/1998   Unspecified SARS-COV-2 Vaccination 06/01/2021   Zoster, Live 01/16/2004   Pertinent  Health Maintenance Due  Topic Date Due   HEMOGLOBIN A1C  08/25/2021   OPHTHALMOLOGY EXAM  04/17/2022   FOOT EXAM  08/20/2022   INFLUENZA VACCINE  Completed      12/05/2021    8:00 AM 12/05/2021   10:38 PM 12/06/2021    7:05 AM 12/06/2021    7:40 PM 12/07/2021    8:45 AM  Fall Risk  (RETIRED) Patient Fall Risk Level High fall risk Moderate fall risk High fall risk High fall risk High fall risk   Functional Status Survey:    Vitals:   01/17/22 1510  BP: 116/78  Pulse: (!) 55  Resp: 16  Temp: (!) 97.5 F (36.4 C)  SpO2: 92%  Weight: 189 lb (85.7 kg)  Height: '5\' 10"'$  (1.778 m)   Body mass index is 27.12 kg/m. Physical Exam Vitals reviewed.  Constitutional:      Appearance: Normal appearance.  Cardiovascular:     Rate and Rhythm: Normal rate.  Pulmonary:     Effort: Pulmonary effort is normal.  Abdominal:     General: Bowel sounds are normal.     Palpations: Abdomen is soft.     Comments: nontender   Skin:    General: Skin is warm and dry.  Neurological:     Mental Status: He is alert and oriented to person, place, and time.     Labs reviewed: Recent Labs    09/02/21 0311 09/03/21 0457 09/16/21 0949 12/04/21 0118 12/04/21 0500 12/06/21 0348  NA 138 137  --  137 136 132*  K 3.6 3.9  --  3.7 3.6 3.7  CL 109 105  --  106 105 103  CO2 25 25  --  '23 25 22  '$ GLUCOSE 125* 112*  --  186* 164* 166*  BUN 26* 22  --  26* 25* 26*  CREATININE 0.91 0.92  --  0.77 0.79 0.79  CALCIUM 10.0 10.5*  --  10.5* 9.8 9.8  MG 1.5* 1.6* 1.6  --   --   --    Recent Labs    02/25/21 1507  AST 15  ALT 11  ALKPHOS 66  BILITOT 0.8  PROT 6.9  ALBUMIN 3.8   Recent Labs    09/01/21 1139 12/04/21 0118 12/04/21 0500 12/06/21 0348 12/07/21 0623  WBC 7.4 12.3* 9.5 11.0* 10.6*  NEUTROABS 5.0 10.2*  --   --   --   HGB 14.0 12.0* 11.0* 10.4* 10.7*  HCT 41.7 34.8* 32.9* 30.8* 30.9*  MCV 94.3 92.3 93.7 93.1 93.1  PLT 134* 174 157 138* 139*   Lab Results  Component Value Date   TSH 0.76 12/11/2012   Lab Results  Component Value Date   HGBA1C 7.6 (H) 02/25/2021   Lab Results  Component Value Date   CHOL 131 02/25/2021   HDL 70.00 02/25/2021   LDLCALC 47 02/25/2021   TRIG 67.0 02/25/2021   CHOLHDL 2 02/25/2021    Significant Diagnostic Results in last 30 days:  No results found.  Assessment/Plan Hematuria, unspecified type  Long term (current) use of anticoagulants  Encounter for anticoagulation discussion and counseling  Elbow laceration, right, sequela Patient with 1 episode of hematuria in the setting of recent change from warfarin to  eliquis. Will plan to decrease dose of eliquis and follow up to see if hematuria persists or stops. Denies symptoms of dysuria, suprapubic tenderness, or illness symptoms. Will continue to monitor. Decrease dose of eliquis to 2.5 mg BID. Elbow laceration from initial fall/ [place] on 12/04/2021.   Family/ staff Communication:  nursing  Labs/tests ordered:  none

## 2022-01-18 DIAGNOSIS — D696 Thrombocytopenia, unspecified: Secondary | ICD-10-CM | POA: Diagnosis not present

## 2022-01-18 DIAGNOSIS — S42201D Unspecified fracture of upper end of right humerus, subsequent encounter for fracture with routine healing: Secondary | ICD-10-CM | POA: Diagnosis not present

## 2022-01-18 DIAGNOSIS — E1140 Type 2 diabetes mellitus with diabetic neuropathy, unspecified: Secondary | ICD-10-CM | POA: Diagnosis not present

## 2022-01-18 DIAGNOSIS — R2689 Other abnormalities of gait and mobility: Secondary | ICD-10-CM | POA: Diagnosis not present

## 2022-01-18 DIAGNOSIS — R262 Difficulty in walking, not elsewhere classified: Secondary | ICD-10-CM | POA: Diagnosis not present

## 2022-01-18 DIAGNOSIS — R2681 Unsteadiness on feet: Secondary | ICD-10-CM | POA: Diagnosis not present

## 2022-01-18 DIAGNOSIS — I442 Atrioventricular block, complete: Secondary | ICD-10-CM | POA: Diagnosis not present

## 2022-01-18 DIAGNOSIS — I495 Sick sinus syndrome: Secondary | ICD-10-CM | POA: Diagnosis not present

## 2022-01-18 DIAGNOSIS — M19122 Post-traumatic osteoarthritis, left elbow: Secondary | ICD-10-CM | POA: Diagnosis not present

## 2022-01-18 DIAGNOSIS — Z950 Presence of cardiac pacemaker: Secondary | ICD-10-CM | POA: Diagnosis not present

## 2022-01-18 DIAGNOSIS — M6281 Muscle weakness (generalized): Secondary | ICD-10-CM | POA: Diagnosis not present

## 2022-01-18 DIAGNOSIS — R278 Other lack of coordination: Secondary | ICD-10-CM | POA: Diagnosis not present

## 2022-01-18 DIAGNOSIS — Z9181 History of falling: Secondary | ICD-10-CM | POA: Diagnosis not present

## 2022-01-18 DIAGNOSIS — S42402D Unspecified fracture of lower end of left humerus, subsequent encounter for fracture with routine healing: Secondary | ICD-10-CM | POA: Diagnosis not present

## 2022-01-18 DIAGNOSIS — M7022 Olecranon bursitis, left elbow: Secondary | ICD-10-CM | POA: Diagnosis not present

## 2022-01-19 DIAGNOSIS — Z950 Presence of cardiac pacemaker: Secondary | ICD-10-CM | POA: Diagnosis not present

## 2022-01-19 DIAGNOSIS — M19122 Post-traumatic osteoarthritis, left elbow: Secondary | ICD-10-CM | POA: Diagnosis not present

## 2022-01-19 DIAGNOSIS — I495 Sick sinus syndrome: Secondary | ICD-10-CM | POA: Diagnosis not present

## 2022-01-19 DIAGNOSIS — M7022 Olecranon bursitis, left elbow: Secondary | ICD-10-CM | POA: Diagnosis not present

## 2022-01-19 DIAGNOSIS — R2681 Unsteadiness on feet: Secondary | ICD-10-CM | POA: Diagnosis not present

## 2022-01-19 DIAGNOSIS — R2689 Other abnormalities of gait and mobility: Secondary | ICD-10-CM | POA: Diagnosis not present

## 2022-01-19 DIAGNOSIS — S42402D Unspecified fracture of lower end of left humerus, subsequent encounter for fracture with routine healing: Secondary | ICD-10-CM | POA: Diagnosis not present

## 2022-01-19 DIAGNOSIS — I442 Atrioventricular block, complete: Secondary | ICD-10-CM | POA: Diagnosis not present

## 2022-01-19 DIAGNOSIS — R278 Other lack of coordination: Secondary | ICD-10-CM | POA: Diagnosis not present

## 2022-01-19 DIAGNOSIS — R262 Difficulty in walking, not elsewhere classified: Secondary | ICD-10-CM | POA: Diagnosis not present

## 2022-01-19 DIAGNOSIS — S42201D Unspecified fracture of upper end of right humerus, subsequent encounter for fracture with routine healing: Secondary | ICD-10-CM | POA: Diagnosis not present

## 2022-01-19 DIAGNOSIS — M6281 Muscle weakness (generalized): Secondary | ICD-10-CM | POA: Diagnosis not present

## 2022-01-19 DIAGNOSIS — E1140 Type 2 diabetes mellitus with diabetic neuropathy, unspecified: Secondary | ICD-10-CM | POA: Diagnosis not present

## 2022-01-19 DIAGNOSIS — Z9181 History of falling: Secondary | ICD-10-CM | POA: Diagnosis not present

## 2022-01-19 DIAGNOSIS — D696 Thrombocytopenia, unspecified: Secondary | ICD-10-CM | POA: Diagnosis not present

## 2022-01-20 DIAGNOSIS — R2689 Other abnormalities of gait and mobility: Secondary | ICD-10-CM | POA: Diagnosis not present

## 2022-01-20 DIAGNOSIS — R262 Difficulty in walking, not elsewhere classified: Secondary | ICD-10-CM | POA: Diagnosis not present

## 2022-01-20 DIAGNOSIS — M6281 Muscle weakness (generalized): Secondary | ICD-10-CM | POA: Diagnosis not present

## 2022-01-20 DIAGNOSIS — M19122 Post-traumatic osteoarthritis, left elbow: Secondary | ICD-10-CM | POA: Diagnosis not present

## 2022-01-20 DIAGNOSIS — I442 Atrioventricular block, complete: Secondary | ICD-10-CM | POA: Diagnosis not present

## 2022-01-20 DIAGNOSIS — Z9181 History of falling: Secondary | ICD-10-CM | POA: Diagnosis not present

## 2022-01-20 DIAGNOSIS — S42402D Unspecified fracture of lower end of left humerus, subsequent encounter for fracture with routine healing: Secondary | ICD-10-CM | POA: Diagnosis not present

## 2022-01-20 DIAGNOSIS — S42201D Unspecified fracture of upper end of right humerus, subsequent encounter for fracture with routine healing: Secondary | ICD-10-CM | POA: Diagnosis not present

## 2022-01-20 DIAGNOSIS — E1140 Type 2 diabetes mellitus with diabetic neuropathy, unspecified: Secondary | ICD-10-CM | POA: Diagnosis not present

## 2022-01-20 DIAGNOSIS — R2681 Unsteadiness on feet: Secondary | ICD-10-CM | POA: Diagnosis not present

## 2022-01-20 DIAGNOSIS — M7022 Olecranon bursitis, left elbow: Secondary | ICD-10-CM | POA: Diagnosis not present

## 2022-01-20 DIAGNOSIS — R278 Other lack of coordination: Secondary | ICD-10-CM | POA: Diagnosis not present

## 2022-01-20 DIAGNOSIS — D696 Thrombocytopenia, unspecified: Secondary | ICD-10-CM | POA: Diagnosis not present

## 2022-01-20 DIAGNOSIS — Z950 Presence of cardiac pacemaker: Secondary | ICD-10-CM | POA: Diagnosis not present

## 2022-01-20 DIAGNOSIS — I495 Sick sinus syndrome: Secondary | ICD-10-CM | POA: Diagnosis not present

## 2022-01-21 DIAGNOSIS — I495 Sick sinus syndrome: Secondary | ICD-10-CM | POA: Diagnosis not present

## 2022-01-21 DIAGNOSIS — R2681 Unsteadiness on feet: Secondary | ICD-10-CM | POA: Diagnosis not present

## 2022-01-21 DIAGNOSIS — I442 Atrioventricular block, complete: Secondary | ICD-10-CM | POA: Diagnosis not present

## 2022-01-21 DIAGNOSIS — R2689 Other abnormalities of gait and mobility: Secondary | ICD-10-CM | POA: Diagnosis not present

## 2022-01-21 DIAGNOSIS — R262 Difficulty in walking, not elsewhere classified: Secondary | ICD-10-CM | POA: Diagnosis not present

## 2022-01-21 DIAGNOSIS — S42201D Unspecified fracture of upper end of right humerus, subsequent encounter for fracture with routine healing: Secondary | ICD-10-CM | POA: Diagnosis not present

## 2022-01-21 DIAGNOSIS — S42402D Unspecified fracture of lower end of left humerus, subsequent encounter for fracture with routine healing: Secondary | ICD-10-CM | POA: Diagnosis not present

## 2022-01-21 DIAGNOSIS — M6281 Muscle weakness (generalized): Secondary | ICD-10-CM | POA: Diagnosis not present

## 2022-01-21 DIAGNOSIS — D696 Thrombocytopenia, unspecified: Secondary | ICD-10-CM | POA: Diagnosis not present

## 2022-01-21 DIAGNOSIS — Z9181 History of falling: Secondary | ICD-10-CM | POA: Diagnosis not present

## 2022-01-21 DIAGNOSIS — M19122 Post-traumatic osteoarthritis, left elbow: Secondary | ICD-10-CM | POA: Diagnosis not present

## 2022-01-21 DIAGNOSIS — Z950 Presence of cardiac pacemaker: Secondary | ICD-10-CM | POA: Diagnosis not present

## 2022-01-21 DIAGNOSIS — R278 Other lack of coordination: Secondary | ICD-10-CM | POA: Diagnosis not present

## 2022-01-21 DIAGNOSIS — E1140 Type 2 diabetes mellitus with diabetic neuropathy, unspecified: Secondary | ICD-10-CM | POA: Diagnosis not present

## 2022-01-21 DIAGNOSIS — M7022 Olecranon bursitis, left elbow: Secondary | ICD-10-CM | POA: Diagnosis not present

## 2022-01-21 NOTE — Progress Notes (Signed)
Remote pacemaker transmission.   

## 2022-01-24 DIAGNOSIS — Z9181 History of falling: Secondary | ICD-10-CM | POA: Diagnosis not present

## 2022-01-24 DIAGNOSIS — R2681 Unsteadiness on feet: Secondary | ICD-10-CM | POA: Diagnosis not present

## 2022-01-24 DIAGNOSIS — E1140 Type 2 diabetes mellitus with diabetic neuropathy, unspecified: Secondary | ICD-10-CM | POA: Diagnosis not present

## 2022-01-24 DIAGNOSIS — M19122 Post-traumatic osteoarthritis, left elbow: Secondary | ICD-10-CM | POA: Diagnosis not present

## 2022-01-24 DIAGNOSIS — I442 Atrioventricular block, complete: Secondary | ICD-10-CM | POA: Diagnosis not present

## 2022-01-24 DIAGNOSIS — I495 Sick sinus syndrome: Secondary | ICD-10-CM | POA: Diagnosis not present

## 2022-01-24 DIAGNOSIS — R2689 Other abnormalities of gait and mobility: Secondary | ICD-10-CM | POA: Diagnosis not present

## 2022-01-24 DIAGNOSIS — M6281 Muscle weakness (generalized): Secondary | ICD-10-CM | POA: Diagnosis not present

## 2022-01-24 DIAGNOSIS — S42402D Unspecified fracture of lower end of left humerus, subsequent encounter for fracture with routine healing: Secondary | ICD-10-CM | POA: Diagnosis not present

## 2022-01-24 DIAGNOSIS — D696 Thrombocytopenia, unspecified: Secondary | ICD-10-CM | POA: Diagnosis not present

## 2022-01-24 DIAGNOSIS — R262 Difficulty in walking, not elsewhere classified: Secondary | ICD-10-CM | POA: Diagnosis not present

## 2022-01-24 DIAGNOSIS — M7022 Olecranon bursitis, left elbow: Secondary | ICD-10-CM | POA: Diagnosis not present

## 2022-01-24 DIAGNOSIS — S42201D Unspecified fracture of upper end of right humerus, subsequent encounter for fracture with routine healing: Secondary | ICD-10-CM | POA: Diagnosis not present

## 2022-01-24 DIAGNOSIS — Z950 Presence of cardiac pacemaker: Secondary | ICD-10-CM | POA: Diagnosis not present

## 2022-01-24 DIAGNOSIS — R278 Other lack of coordination: Secondary | ICD-10-CM | POA: Diagnosis not present

## 2022-01-25 DIAGNOSIS — R2681 Unsteadiness on feet: Secondary | ICD-10-CM | POA: Diagnosis not present

## 2022-01-25 DIAGNOSIS — M19122 Post-traumatic osteoarthritis, left elbow: Secondary | ICD-10-CM | POA: Diagnosis not present

## 2022-01-25 DIAGNOSIS — Z9181 History of falling: Secondary | ICD-10-CM | POA: Diagnosis not present

## 2022-01-25 DIAGNOSIS — R262 Difficulty in walking, not elsewhere classified: Secondary | ICD-10-CM | POA: Diagnosis not present

## 2022-01-25 DIAGNOSIS — Z950 Presence of cardiac pacemaker: Secondary | ICD-10-CM | POA: Diagnosis not present

## 2022-01-25 DIAGNOSIS — R278 Other lack of coordination: Secondary | ICD-10-CM | POA: Diagnosis not present

## 2022-01-25 DIAGNOSIS — I442 Atrioventricular block, complete: Secondary | ICD-10-CM | POA: Diagnosis not present

## 2022-01-25 DIAGNOSIS — E1140 Type 2 diabetes mellitus with diabetic neuropathy, unspecified: Secondary | ICD-10-CM | POA: Diagnosis not present

## 2022-01-25 DIAGNOSIS — D696 Thrombocytopenia, unspecified: Secondary | ICD-10-CM | POA: Diagnosis not present

## 2022-01-25 DIAGNOSIS — S42201D Unspecified fracture of upper end of right humerus, subsequent encounter for fracture with routine healing: Secondary | ICD-10-CM | POA: Diagnosis not present

## 2022-01-25 DIAGNOSIS — R2689 Other abnormalities of gait and mobility: Secondary | ICD-10-CM | POA: Diagnosis not present

## 2022-01-25 DIAGNOSIS — I495 Sick sinus syndrome: Secondary | ICD-10-CM | POA: Diagnosis not present

## 2022-01-25 DIAGNOSIS — M7022 Olecranon bursitis, left elbow: Secondary | ICD-10-CM | POA: Diagnosis not present

## 2022-01-25 DIAGNOSIS — M6281 Muscle weakness (generalized): Secondary | ICD-10-CM | POA: Diagnosis not present

## 2022-01-25 DIAGNOSIS — S42402D Unspecified fracture of lower end of left humerus, subsequent encounter for fracture with routine healing: Secondary | ICD-10-CM | POA: Diagnosis not present

## 2022-01-26 DIAGNOSIS — R2689 Other abnormalities of gait and mobility: Secondary | ICD-10-CM | POA: Diagnosis not present

## 2022-01-26 DIAGNOSIS — Z950 Presence of cardiac pacemaker: Secondary | ICD-10-CM | POA: Diagnosis not present

## 2022-01-26 DIAGNOSIS — R262 Difficulty in walking, not elsewhere classified: Secondary | ICD-10-CM | POA: Diagnosis not present

## 2022-01-26 DIAGNOSIS — M19122 Post-traumatic osteoarthritis, left elbow: Secondary | ICD-10-CM | POA: Diagnosis not present

## 2022-01-26 DIAGNOSIS — I495 Sick sinus syndrome: Secondary | ICD-10-CM | POA: Diagnosis not present

## 2022-01-26 DIAGNOSIS — S42201D Unspecified fracture of upper end of right humerus, subsequent encounter for fracture with routine healing: Secondary | ICD-10-CM | POA: Diagnosis not present

## 2022-01-26 DIAGNOSIS — R278 Other lack of coordination: Secondary | ICD-10-CM | POA: Diagnosis not present

## 2022-01-26 DIAGNOSIS — S42402D Unspecified fracture of lower end of left humerus, subsequent encounter for fracture with routine healing: Secondary | ICD-10-CM | POA: Diagnosis not present

## 2022-01-26 DIAGNOSIS — M6281 Muscle weakness (generalized): Secondary | ICD-10-CM | POA: Diagnosis not present

## 2022-01-26 DIAGNOSIS — R2681 Unsteadiness on feet: Secondary | ICD-10-CM | POA: Diagnosis not present

## 2022-01-26 DIAGNOSIS — E1140 Type 2 diabetes mellitus with diabetic neuropathy, unspecified: Secondary | ICD-10-CM | POA: Diagnosis not present

## 2022-01-26 DIAGNOSIS — D696 Thrombocytopenia, unspecified: Secondary | ICD-10-CM | POA: Diagnosis not present

## 2022-01-26 DIAGNOSIS — M7022 Olecranon bursitis, left elbow: Secondary | ICD-10-CM | POA: Diagnosis not present

## 2022-01-26 DIAGNOSIS — I442 Atrioventricular block, complete: Secondary | ICD-10-CM | POA: Diagnosis not present

## 2022-01-26 DIAGNOSIS — Z9181 History of falling: Secondary | ICD-10-CM | POA: Diagnosis not present

## 2022-01-26 NOTE — Addendum Note (Signed)
Addended by: Dewayne Shorter on: 01/26/2022 10:03 PM   Modules accepted: Level of Service

## 2022-01-27 DIAGNOSIS — M6281 Muscle weakness (generalized): Secondary | ICD-10-CM | POA: Diagnosis not present

## 2022-01-27 DIAGNOSIS — M7022 Olecranon bursitis, left elbow: Secondary | ICD-10-CM | POA: Diagnosis not present

## 2022-01-27 DIAGNOSIS — I495 Sick sinus syndrome: Secondary | ICD-10-CM | POA: Diagnosis not present

## 2022-01-27 DIAGNOSIS — R278 Other lack of coordination: Secondary | ICD-10-CM | POA: Diagnosis not present

## 2022-01-27 DIAGNOSIS — R2681 Unsteadiness on feet: Secondary | ICD-10-CM | POA: Diagnosis not present

## 2022-01-27 DIAGNOSIS — Z9181 History of falling: Secondary | ICD-10-CM | POA: Diagnosis not present

## 2022-01-27 DIAGNOSIS — I442 Atrioventricular block, complete: Secondary | ICD-10-CM | POA: Diagnosis not present

## 2022-01-27 DIAGNOSIS — E1140 Type 2 diabetes mellitus with diabetic neuropathy, unspecified: Secondary | ICD-10-CM | POA: Diagnosis not present

## 2022-01-27 DIAGNOSIS — S42201D Unspecified fracture of upper end of right humerus, subsequent encounter for fracture with routine healing: Secondary | ICD-10-CM | POA: Diagnosis not present

## 2022-01-27 DIAGNOSIS — D696 Thrombocytopenia, unspecified: Secondary | ICD-10-CM | POA: Diagnosis not present

## 2022-01-27 DIAGNOSIS — S42402D Unspecified fracture of lower end of left humerus, subsequent encounter for fracture with routine healing: Secondary | ICD-10-CM | POA: Diagnosis not present

## 2022-01-27 DIAGNOSIS — Z950 Presence of cardiac pacemaker: Secondary | ICD-10-CM | POA: Diagnosis not present

## 2022-01-27 DIAGNOSIS — M19122 Post-traumatic osteoarthritis, left elbow: Secondary | ICD-10-CM | POA: Diagnosis not present

## 2022-01-27 DIAGNOSIS — R2689 Other abnormalities of gait and mobility: Secondary | ICD-10-CM | POA: Diagnosis not present

## 2022-01-27 DIAGNOSIS — R262 Difficulty in walking, not elsewhere classified: Secondary | ICD-10-CM | POA: Diagnosis not present

## 2022-01-28 ENCOUNTER — Encounter: Payer: Self-pay | Admitting: Student

## 2022-01-28 ENCOUNTER — Non-Acute Institutional Stay (SKILLED_NURSING_FACILITY): Payer: Medicare Other | Admitting: Student

## 2022-01-28 DIAGNOSIS — R262 Difficulty in walking, not elsewhere classified: Secondary | ICD-10-CM | POA: Diagnosis not present

## 2022-01-28 DIAGNOSIS — R2689 Other abnormalities of gait and mobility: Secondary | ICD-10-CM | POA: Diagnosis not present

## 2022-01-28 DIAGNOSIS — R278 Other lack of coordination: Secondary | ICD-10-CM | POA: Diagnosis not present

## 2022-01-28 DIAGNOSIS — R2681 Unsteadiness on feet: Secondary | ICD-10-CM | POA: Diagnosis not present

## 2022-01-28 DIAGNOSIS — S42201D Unspecified fracture of upper end of right humerus, subsequent encounter for fracture with routine healing: Secondary | ICD-10-CM | POA: Diagnosis not present

## 2022-01-28 DIAGNOSIS — M19122 Post-traumatic osteoarthritis, left elbow: Secondary | ICD-10-CM | POA: Diagnosis not present

## 2022-01-28 DIAGNOSIS — Z950 Presence of cardiac pacemaker: Secondary | ICD-10-CM | POA: Diagnosis not present

## 2022-01-28 DIAGNOSIS — M7022 Olecranon bursitis, left elbow: Secondary | ICD-10-CM | POA: Diagnosis not present

## 2022-01-28 DIAGNOSIS — I495 Sick sinus syndrome: Secondary | ICD-10-CM | POA: Diagnosis not present

## 2022-01-28 DIAGNOSIS — Z9181 History of falling: Secondary | ICD-10-CM | POA: Diagnosis not present

## 2022-01-28 DIAGNOSIS — I442 Atrioventricular block, complete: Secondary | ICD-10-CM | POA: Diagnosis not present

## 2022-01-28 DIAGNOSIS — E1140 Type 2 diabetes mellitus with diabetic neuropathy, unspecified: Secondary | ICD-10-CM | POA: Diagnosis not present

## 2022-01-28 DIAGNOSIS — M6281 Muscle weakness (generalized): Secondary | ICD-10-CM | POA: Diagnosis not present

## 2022-01-28 DIAGNOSIS — M1711 Unilateral primary osteoarthritis, right knee: Secondary | ICD-10-CM

## 2022-01-28 DIAGNOSIS — S42402D Unspecified fracture of lower end of left humerus, subsequent encounter for fracture with routine healing: Secondary | ICD-10-CM | POA: Diagnosis not present

## 2022-01-28 DIAGNOSIS — D696 Thrombocytopenia, unspecified: Secondary | ICD-10-CM | POA: Diagnosis not present

## 2022-01-28 NOTE — Progress Notes (Unsigned)
Location:  Other [place].  Nursing Home Room Number: [place]:  SNF [phone]) Provider:  Dewayne Shorter, MD  Patient Care Team: Dewayne Shorter, MD as PCP - General (Family Medicine) Minna Merritts, MD as PCP - Cardiology (Cardiology) Minna Merritts, MD as Consulting Physician (Cardiology)  Extended Emergency Contact Information Primary Emergency Contact: [place] Phone: [phone] Relation: Son  Code Status:  DNR Goals of care: Advanced Directive information    01/28/2022   11:24 AM  Advanced Directives  Does Patient Have a Medical Advance Directive? Yes  Type of Paramedic of [place];Out of facility DNR (pink MOST or yellow form);Living will  Does patient want to make changes to medical advance directive? No - Patient declined  Copy of [place] in Chart? Yes - validated most recent copy scanned in chart (See row information)     Chief Complaint  Patient presents with   Acute Visit    Knee Pain.     HPI:  Pt is a 89 y.o. male seen today for an acute visit for knee pain.  He has had more pain in the right knee. Had an injection 3 weeks ago. It's limiting his physical therapy. Wants to go back to orthopedic surgery as well.    Past Medical History:  Diagnosis Date   Asthma    Atrial flutter ([place]) 2007   Band keratopathy    Benign prostatic hypertrophy    Chronic ear infection    Chronic kidney disease    Chronic prostatitis    Dental bridge present    permanent - upper   Diabetes mellitus    Elbow stiffness, left    s/p fracture many yrs ago.  arm does not straighten   GERD (gastroesophageal reflux disease)    laryngeal involvement   Hyperlipidemia    Hypertension    Obstructive sleep apnea    CPAP-9   Osteoarthrosis, localized, primary, knee    post-traumatic   Prostatitis    Stroke ([place])    TIA   Tachy-brady syndrome ([place])    UTI (urinary tract infection)     Past Surgical History:  Procedure Laterality Date   APPENDECTOMY     BELPHAROPTOSIS REPAIR     Dr [name]---didn't resolve weepy eye and eyelid drooping   CARDIOVERSION  04/13/2010   CATARACT EXTRACTION, BILATERAL  2009   Chest pain  8/12   Stress test benign   ESOPHAGEAL DILATION  05/26/2016   Procedure: ESOPHAGEAL DILATION;  Surgeon: Lucilla Lame, MD;  Location: [facility];  Service: Endoscopy;;   ESOPHAGOGASTRODUODENOSCOPY (EGD) WITH PROPOFOL N/A 05/26/2016   Procedure: ESOPHAGOGASTRODUODENOSCOPY (EGD) WITH PROPOFOL;  Surgeon: Lucilla Lame, MD;  Location: [facility];  Service: Endoscopy;  Laterality: N/A;  Diabetic - oral meds sleep apnea   EYE SURGERY     FRACTURE SURGERY Left    elbow   KNEE SURGERY  1998   plate after fracture, then removed for infection   left elbow surgery     MASTOIDECTOMY  8/08   Dr Idelle Crouch   PACEMAKER IMPLANT N/A 09/02/2021   Procedure: PACEMAKER IMPLANT;  Surgeon: Vickie Epley, MD;  Location: [facility];  Service: Cardiovascular;  Laterality: N/A;   RHINOPLASTY  5/10   and septoplasty   SUBACROMIAL DECOMPRESSION Right 2005   Arthroscopic (for rotator cuff and biceps tendon ruptures)   TEAR DUCT PROBING  11/13   Dr Vickki Muff   TOTAL KNEE ARTHROPLASTY  Left 09/01/2014   Procedure: TOTAL KNEE ARTHROPLASTY;  Surgeon: Dereck Leep, MD;  Location: [facility];  Service: Orthopedics;  Laterality: Left;   TRIGGER FINGER RELEASE Left 02/25/2015   Procedure: LEFT LONG TRIGGER RELEASE;  Surgeon: Dereck Leep, MD;  Location: [facility];  Service: Orthopedics;  Laterality: Left;   VENOUS ABLATION      Allergies  Allergen Reactions   Lovenox [Enoxaparin] Hives   Proscar [Finasteride] Other (See Comments)    Upper body and arm weakness   Latex Rash    Has trouble with BANDAIDS that have been left on for more than 24 hours. Prefers paper tape.   Nickel Rash    Localized rash   Percocet [Oxycodone-Acetaminophen] Rash   Tape Rash and  Other (See Comments)    Sensitivity     Outpatient Encounter Medications as of 01/28/2022  Medication Sig   acetaminophen (TYLENOL) 500 MG tablet Take 1,000 mg by mouth every 8 (eight) hours as needed.   apixaban (ELIQUIS) 2.5 MG TABS tablet Take 2.5 mg by mouth 2 (two) times daily.   APPLE CIDER VINEGAR PO Take 1 capsule by mouth at bedtime.   atorvastatin (LIPITOR) 80 MG tablet Take 1 tablet (80 mg total) by mouth daily.   cetirizine (ZYRTEC) 5 MG tablet Take 5 mg by mouth daily.   Cranberry 250 MG TABS Take 1 tablet by mouth 2 (two) times daily.   Cyanocobalamin (B-12 PO) Take 1,000 mg by mouth daily.   docusate sodium (COLACE) 100 MG capsule Take 100 mg by mouth 2 (two) times daily.   finasteride (PROSCAR) 5 MG tablet Take 1 tablet (5 mg total) by mouth daily.   lidocaine (LIDODERM) 5 % Place 1 patch onto the skin daily. Remove & Discard patch within 12 hours or as directed by MD   Magnesium Oxide 400 MG CAPS Take 1 capsule (400 mg total) by mouth daily.   metFORMIN (GLUCOPHAGE) 500 MG tablet TAKE 1 TABLET BY MOUTH  TWICE DAILY WITH MEALS   methocarbamol (ROBAXIN) 500 MG tablet Take 1 tablet (500 mg total) by mouth every 8 (eight) hours as needed for muscle spasms.   Polyethyl Glycol-Propyl Glycol (SYSTANE ULTRA OP) Place 1 drop into both eyes daily as needed (dry eyes).   senna (SENOKOT) 8.6 MG TABS tablet Take 1 tablet (8.6 mg total) by mouth daily.   silver sulfADIAZINE (SILVADENE) 1 % cream Apply 1 Application topically daily.   tamsulosin (FLOMAX) 0.4 MG CAPS capsule Take 0.4 mg by mouth at bedtime.   traMADol (ULTRAM) 50 MG tablet Take 1 tablet (50 mg total) by mouth every 6 (six) hours as needed for moderate pain.   triamterene-hydrochlorothiazide (DYAZIDE) 37.5-25 MG capsule TAKE 1 CAPSULE BY MOUTH IN  THE MORNING   No facility-administered encounter medications on file as of 01/28/2022.    Review of Systems  Immunization History  Administered Date(s) Administered   Fluad  Quad(high Dose 65+) 09/14/2018   H1N1 12/13/2007   Influenza Split 09/11/2010   Influenza Whole 09/12/2011   Influenza, High Dose Seasonal PF 09/20/2012, 09/07/2017   Influenza, Seasonal, Injecte, Preservative Fre 09/08/2007, 09/26/2008, 10/02/2014, 09/18/2015   Influenza,inj,Quad PF,6+ Mos 09/19/2013, 09/21/2015   Influenza-Unspecified 09/07/2017, 09/11/2019, 09/03/2020, 10/19/2021   Moderna Sars-Covid-2 Vaccination 01/18/2019, 02/15/2019, 10/17/2020, 06/01/2021   Pneumococcal Conjugate-13 09/19/2013   Pneumococcal Polysaccharide-23 09/26/2003, 06/28/2016   Td 08/04/1998   Tdap 06/04/2010, 08/15/2021   Tetanus 08/04/1998   Unspecified SARS-COV-2 Vaccination 06/01/2021   Zoster, Live 01/16/2004   Pertinent  Health Maintenance Due  Topic Date Due   HEMOGLOBIN A1C  08/25/2021   OPHTHALMOLOGY EXAM  04/17/2022   FOOT EXAM  08/20/2022   INFLUENZA VACCINE  Completed      12/05/2021    8:00 AM 12/05/2021   10:38 PM 12/06/2021    7:05 AM 12/06/2021    7:40 PM 12/07/2021    8:45 AM  Fall Risk  (RETIRED) Patient Fall Risk Level High fall risk Moderate fall risk High fall risk High fall risk High fall risk   Functional Status Survey:    Vitals:   01/28/22 1117  BP: 126/81  Pulse: 90  Resp: 18  Temp: (!) 96.2 F (35.7 C)  SpO2: 94%  Weight: 186 lb (84.4 kg)  Height: '5\' 10"'$  (1.778 m)   Body mass index is 26.69 kg/m. Physical Exam Constitutional:      Appearance: Normal appearance.  Musculoskeletal:     Comments: Right knee with erythema from previous injection site. No obvious effusion on exam. Tenderness to palpation surrounding patella  Neurological:     Mental Status: He is alert and oriented to person, place, and time.     Labs reviewed: Recent Labs    09/02/21 0311 09/03/21 0457 09/16/21 0949 12/04/21 0118 12/04/21 0500 12/06/21 0348  NA 138 137  --  137 136 132*  K 3.6 3.9  --  3.7 3.6 3.7  CL 109 105  --  106 105 103  CO2 25 25  --  '23 25 22  '$ GLUCOSE 125*  112*  --  186* 164* 166*  BUN 26* 22  --  26* 25* 26*  CREATININE 0.91 0.92  --  0.77 0.79 0.79  CALCIUM 10.0 10.5*  --  10.5* 9.8 9.8  MG 1.5* 1.6* 1.6  --   --   --    Recent Labs    02/25/21 1507  AST 15  ALT 11  ALKPHOS 66  BILITOT 0.8  PROT 6.9  ALBUMIN 3.8   Recent Labs    09/01/21 1139 12/04/21 0118 12/04/21 0500 12/06/21 0348 12/07/21 0623  WBC 7.4 12.3* 9.5 11.0* 10.6*  NEUTROABS 5.0 10.2*  --   --   --   HGB 14.0 12.0* 11.0* 10.4* 10.7*  HCT 41.7 34.8* 32.9* 30.8* 30.9*  MCV 94.3 92.3 93.7 93.1 93.1  PLT 134* 174 157 138* 139*   Lab Results  Component Value Date   TSH 0.76 12/11/2012   Lab Results  Component Value Date   HGBA1C 7.6 (H) 02/25/2021   Lab Results  Component Value Date   CHOL 131 02/25/2021   HDL 70.00 02/25/2021   LDLCALC 47 02/25/2021   TRIG 67.0 02/25/2021   CHOLHDL 2 02/25/2021    Significant Diagnostic Results in last 30 days:  No results found.  Assessment/Plan Primary osteoarthritis of right knee Patient is followed by orthopedic surgery for triple injections periodically.  Most recent was 3 weeks ago, however patient continued to have pain which is recently worsened.  Will contact orthopedic group for follow-up evaluation given continued pain.  Continue Tylenol 3 times daily as needed. Will send message to Rosalia Hammers, DO.    Family/ staff Communication: nursing  Labs/tests ordered:  none

## 2022-01-29 DIAGNOSIS — Z9181 History of falling: Secondary | ICD-10-CM | POA: Diagnosis not present

## 2022-01-29 DIAGNOSIS — R2681 Unsteadiness on feet: Secondary | ICD-10-CM | POA: Diagnosis not present

## 2022-01-29 DIAGNOSIS — R278 Other lack of coordination: Secondary | ICD-10-CM | POA: Diagnosis not present

## 2022-01-29 DIAGNOSIS — E1140 Type 2 diabetes mellitus with diabetic neuropathy, unspecified: Secondary | ICD-10-CM | POA: Diagnosis not present

## 2022-01-29 DIAGNOSIS — R262 Difficulty in walking, not elsewhere classified: Secondary | ICD-10-CM | POA: Diagnosis not present

## 2022-01-29 DIAGNOSIS — R2689 Other abnormalities of gait and mobility: Secondary | ICD-10-CM | POA: Diagnosis not present

## 2022-01-29 DIAGNOSIS — S42201D Unspecified fracture of upper end of right humerus, subsequent encounter for fracture with routine healing: Secondary | ICD-10-CM | POA: Diagnosis not present

## 2022-01-29 DIAGNOSIS — I442 Atrioventricular block, complete: Secondary | ICD-10-CM | POA: Diagnosis not present

## 2022-01-29 DIAGNOSIS — M19122 Post-traumatic osteoarthritis, left elbow: Secondary | ICD-10-CM | POA: Diagnosis not present

## 2022-01-29 DIAGNOSIS — I495 Sick sinus syndrome: Secondary | ICD-10-CM | POA: Diagnosis not present

## 2022-01-29 DIAGNOSIS — M6281 Muscle weakness (generalized): Secondary | ICD-10-CM | POA: Diagnosis not present

## 2022-01-29 DIAGNOSIS — D696 Thrombocytopenia, unspecified: Secondary | ICD-10-CM | POA: Diagnosis not present

## 2022-01-29 DIAGNOSIS — M7022 Olecranon bursitis, left elbow: Secondary | ICD-10-CM | POA: Diagnosis not present

## 2022-01-29 DIAGNOSIS — S42402D Unspecified fracture of lower end of left humerus, subsequent encounter for fracture with routine healing: Secondary | ICD-10-CM | POA: Diagnosis not present

## 2022-01-29 DIAGNOSIS — Z950 Presence of cardiac pacemaker: Secondary | ICD-10-CM | POA: Diagnosis not present

## 2022-01-30 ENCOUNTER — Other Ambulatory Visit: Payer: Self-pay | Admitting: Student

## 2022-01-30 DIAGNOSIS — I4892 Unspecified atrial flutter: Secondary | ICD-10-CM

## 2022-01-31 ENCOUNTER — Telehealth: Payer: Self-pay | Admitting: Student

## 2022-01-31 DIAGNOSIS — R278 Other lack of coordination: Secondary | ICD-10-CM | POA: Diagnosis not present

## 2022-01-31 DIAGNOSIS — S42402D Unspecified fracture of lower end of left humerus, subsequent encounter for fracture with routine healing: Secondary | ICD-10-CM | POA: Diagnosis not present

## 2022-01-31 DIAGNOSIS — I495 Sick sinus syndrome: Secondary | ICD-10-CM | POA: Diagnosis not present

## 2022-01-31 DIAGNOSIS — M6281 Muscle weakness (generalized): Secondary | ICD-10-CM | POA: Diagnosis not present

## 2022-01-31 DIAGNOSIS — Z9181 History of falling: Secondary | ICD-10-CM | POA: Diagnosis not present

## 2022-01-31 DIAGNOSIS — R2689 Other abnormalities of gait and mobility: Secondary | ICD-10-CM | POA: Diagnosis not present

## 2022-01-31 DIAGNOSIS — Z950 Presence of cardiac pacemaker: Secondary | ICD-10-CM | POA: Diagnosis not present

## 2022-01-31 DIAGNOSIS — R262 Difficulty in walking, not elsewhere classified: Secondary | ICD-10-CM | POA: Diagnosis not present

## 2022-01-31 DIAGNOSIS — R2681 Unsteadiness on feet: Secondary | ICD-10-CM | POA: Diagnosis not present

## 2022-01-31 DIAGNOSIS — M19122 Post-traumatic osteoarthritis, left elbow: Secondary | ICD-10-CM | POA: Diagnosis not present

## 2022-01-31 DIAGNOSIS — M7022 Olecranon bursitis, left elbow: Secondary | ICD-10-CM | POA: Diagnosis not present

## 2022-01-31 DIAGNOSIS — I442 Atrioventricular block, complete: Secondary | ICD-10-CM | POA: Diagnosis not present

## 2022-01-31 DIAGNOSIS — D696 Thrombocytopenia, unspecified: Secondary | ICD-10-CM | POA: Diagnosis not present

## 2022-01-31 DIAGNOSIS — E1140 Type 2 diabetes mellitus with diabetic neuropathy, unspecified: Secondary | ICD-10-CM | POA: Diagnosis not present

## 2022-01-31 DIAGNOSIS — S42201D Unspecified fracture of upper end of right humerus, subsequent encounter for fracture with routine healing: Secondary | ICD-10-CM | POA: Diagnosis not present

## 2022-01-31 NOTE — Telephone Encounter (Signed)
Received call from nurse on call that patient had urinary retention with I&O for 800 mL. Declined placement of foley. Will send urinarlysis and culture. Increase flomax to 0.'8mg'$  nightly.

## 2022-02-01 ENCOUNTER — Telehealth: Payer: Self-pay

## 2022-02-01 DIAGNOSIS — Z950 Presence of cardiac pacemaker: Secondary | ICD-10-CM | POA: Diagnosis not present

## 2022-02-01 DIAGNOSIS — R2689 Other abnormalities of gait and mobility: Secondary | ICD-10-CM | POA: Diagnosis not present

## 2022-02-01 DIAGNOSIS — M6281 Muscle weakness (generalized): Secondary | ICD-10-CM | POA: Diagnosis not present

## 2022-02-01 DIAGNOSIS — D696 Thrombocytopenia, unspecified: Secondary | ICD-10-CM | POA: Diagnosis not present

## 2022-02-01 DIAGNOSIS — R278 Other lack of coordination: Secondary | ICD-10-CM | POA: Diagnosis not present

## 2022-02-01 DIAGNOSIS — Z9181 History of falling: Secondary | ICD-10-CM | POA: Diagnosis not present

## 2022-02-01 DIAGNOSIS — I442 Atrioventricular block, complete: Secondary | ICD-10-CM | POA: Diagnosis not present

## 2022-02-01 DIAGNOSIS — S42201D Unspecified fracture of upper end of right humerus, subsequent encounter for fracture with routine healing: Secondary | ICD-10-CM | POA: Diagnosis not present

## 2022-02-01 DIAGNOSIS — M19122 Post-traumatic osteoarthritis, left elbow: Secondary | ICD-10-CM | POA: Diagnosis not present

## 2022-02-01 DIAGNOSIS — I495 Sick sinus syndrome: Secondary | ICD-10-CM | POA: Diagnosis not present

## 2022-02-01 DIAGNOSIS — R262 Difficulty in walking, not elsewhere classified: Secondary | ICD-10-CM | POA: Diagnosis not present

## 2022-02-01 DIAGNOSIS — R2681 Unsteadiness on feet: Secondary | ICD-10-CM | POA: Diagnosis not present

## 2022-02-01 DIAGNOSIS — S42402D Unspecified fracture of lower end of left humerus, subsequent encounter for fracture with routine healing: Secondary | ICD-10-CM | POA: Diagnosis not present

## 2022-02-01 DIAGNOSIS — M7022 Olecranon bursitis, left elbow: Secondary | ICD-10-CM | POA: Diagnosis not present

## 2022-02-01 DIAGNOSIS — E1140 Type 2 diabetes mellitus with diabetic neuropathy, unspecified: Secondary | ICD-10-CM | POA: Diagnosis not present

## 2022-02-01 NOTE — Telephone Encounter (Signed)
I called pt to ask about moving his appt on 03-02-22 from 1215 to a time earlier that day. Also, It looks like he may have changed to the Dr at [facility]. If so, we can cancel this appt all together.

## 2022-02-03 ENCOUNTER — Non-Acute Institutional Stay (SKILLED_NURSING_FACILITY): Payer: Medicare Other | Admitting: Nurse Practitioner

## 2022-02-03 ENCOUNTER — Telehealth: Payer: Self-pay | Admitting: Student

## 2022-02-03 ENCOUNTER — Encounter: Payer: Self-pay | Admitting: Nurse Practitioner

## 2022-02-03 DIAGNOSIS — M19122 Post-traumatic osteoarthritis, left elbow: Secondary | ICD-10-CM | POA: Diagnosis not present

## 2022-02-03 DIAGNOSIS — R2689 Other abnormalities of gait and mobility: Secondary | ICD-10-CM | POA: Diagnosis not present

## 2022-02-03 DIAGNOSIS — M6281 Muscle weakness (generalized): Secondary | ICD-10-CM | POA: Diagnosis not present

## 2022-02-03 DIAGNOSIS — D696 Thrombocytopenia, unspecified: Secondary | ICD-10-CM | POA: Diagnosis not present

## 2022-02-03 DIAGNOSIS — E109 Type 1 diabetes mellitus without complications: Secondary | ICD-10-CM | POA: Diagnosis not present

## 2022-02-03 DIAGNOSIS — R278 Other lack of coordination: Secondary | ICD-10-CM | POA: Diagnosis not present

## 2022-02-03 DIAGNOSIS — I10 Essential (primary) hypertension: Secondary | ICD-10-CM | POA: Diagnosis not present

## 2022-02-03 DIAGNOSIS — E1140 Type 2 diabetes mellitus with diabetic neuropathy, unspecified: Secondary | ICD-10-CM | POA: Diagnosis not present

## 2022-02-03 DIAGNOSIS — R630 Anorexia: Secondary | ICD-10-CM | POA: Diagnosis not present

## 2022-02-03 DIAGNOSIS — R262 Difficulty in walking, not elsewhere classified: Secondary | ICD-10-CM | POA: Diagnosis not present

## 2022-02-03 DIAGNOSIS — R4586 Emotional lability: Secondary | ICD-10-CM

## 2022-02-03 DIAGNOSIS — Z950 Presence of cardiac pacemaker: Secondary | ICD-10-CM | POA: Diagnosis not present

## 2022-02-03 DIAGNOSIS — S42402D Unspecified fracture of lower end of left humerus, subsequent encounter for fracture with routine healing: Secondary | ICD-10-CM | POA: Diagnosis not present

## 2022-02-03 DIAGNOSIS — R338 Other retention of urine: Secondary | ICD-10-CM

## 2022-02-03 DIAGNOSIS — N138 Other obstructive and reflux uropathy: Secondary | ICD-10-CM | POA: Diagnosis not present

## 2022-02-03 DIAGNOSIS — S42201D Unspecified fracture of upper end of right humerus, subsequent encounter for fracture with routine healing: Secondary | ICD-10-CM | POA: Diagnosis not present

## 2022-02-03 DIAGNOSIS — N401 Enlarged prostate with lower urinary tract symptoms: Secondary | ICD-10-CM

## 2022-02-03 DIAGNOSIS — R2681 Unsteadiness on feet: Secondary | ICD-10-CM | POA: Diagnosis not present

## 2022-02-03 DIAGNOSIS — M7022 Olecranon bursitis, left elbow: Secondary | ICD-10-CM | POA: Diagnosis not present

## 2022-02-03 DIAGNOSIS — Z9181 History of falling: Secondary | ICD-10-CM | POA: Diagnosis not present

## 2022-02-03 DIAGNOSIS — I495 Sick sinus syndrome: Secondary | ICD-10-CM | POA: Diagnosis not present

## 2022-02-03 DIAGNOSIS — I5032 Chronic diastolic (congestive) heart failure: Secondary | ICD-10-CM | POA: Diagnosis not present

## 2022-02-03 DIAGNOSIS — I442 Atrioventricular block, complete: Secondary | ICD-10-CM | POA: Diagnosis not present

## 2022-02-03 NOTE — Progress Notes (Unsigned)
Location:   [facility] Room Number: 107A Place of Service:  SNF [phone]) Provider:  Sherrie Mustache, NP  Dewayne Shorter, MD  Patient Care Team: Dewayne Shorter, MD as PCP - General (Family Medicine) Minna Merritts, MD as PCP - Cardiology (Cardiology) Minna Merritts, MD as Consulting Physician (Cardiology)  Extended Emergency Contact Information Primary Emergency Contact: Michael Colon Phone: [phone] Relation: Son  Code Status:  DNR Goals of care: Advanced Directive information    02/03/2022    9:26 AM  Advanced Directives  Does Patient Have a Medical Advance Directive? Yes  Type of Paramedic of [place];Living will;Out of facility DNR (pink MOST or yellow form)  Does patient want to make changes to medical advance directive? No - Patient declined  Copy of [place] in Chart? Yes - validated most recent copy scanned in chart (See row information)  Pre-existing out of facility DNR order (yellow form or pink MOST form) Yellow form placed in chart (order not valid for inpatient use)     Chief Complaint  Patient presents with   Follow-up    Follow up from dehydration.     HPI:  Pt is a 89 y.o. male seen today for acute visit. Per report was very dry, dark urine,  and nursing obtained order for IV fluids yesterday.  Pt had urinary retention earlier in the week and required foley catheter placement.  Pending appt with urology for follow up. Continues on proscar and flomax   Staff question if he is having some delirium.   He reports he has lost interest in eating.  Reports he is not sleeping well.  He is not interested in taking medication to help his sleep or appetitie.  Denies depression.  Reports he is having some nausea but no vomiting. Reports food is too spicy or salt  Eggs vvercooked He is aware of the place, year, day of the week.  [place]     Past Medical History:  Diagnosis Date   Asthma    Atrial flutter ([place]) 2007   Band keratopathy    Benign prostatic hypertrophy    Chronic ear infection    Chronic kidney disease    Chronic prostatitis    Dental bridge present    permanent - upper   Diabetes mellitus    Elbow stiffness, left    s/p fracture many yrs ago.  arm does not straighten   GERD (gastroesophageal reflux disease)    laryngeal involvement   Hyperlipidemia    Hypertension    Obstructive sleep apnea    CPAP-9   Osteoarthrosis, localized, primary, knee    post-traumatic   Prostatitis    Stroke ([place])    TIA   Tachy-brady syndrome ([place])    UTI (urinary tract infection)    Past Surgical History:  Procedure Laterality Date   APPENDECTOMY     BELPHAROPTOSIS REPAIR     Dr [name]---didn't resolve weepy eye and eyelid drooping   CARDIOVERSION  04/13/2010   CATARACT EXTRACTION, BILATERAL  2009   Chest pain  8/12   Stress test benign   ESOPHAGEAL DILATION  05/26/2016   Procedure: ESOPHAGEAL DILATION;  Surgeon: Lucilla Lame, MD;  Location: [facility];  Service: Endoscopy;;   ESOPHAGOGASTRODUODENOSCOPY (EGD) WITH PROPOFOL N/A 05/26/2016   Procedure: ESOPHAGOGASTRODUODENOSCOPY (EGD) WITH PROPOFOL;  Surgeon: Lucilla Lame, MD;  Location: [facility];  Service: Endoscopy;  Laterality:  N/A;  Diabetic - oral meds sleep apnea   EYE SURGERY     FRACTURE SURGERY Left    elbow   KNEE SURGERY  1998   plate after fracture, then removed for infection   left elbow surgery     MASTOIDECTOMY  8/08   Dr Idelle Crouch   PACEMAKER IMPLANT N/A 09/02/2021   Procedure: PACEMAKER IMPLANT;  Surgeon: Vickie Epley, MD;  Location: [facility];  Service: Cardiovascular;  Laterality: N/A;   RHINOPLASTY  5/10   and septoplasty   SUBACROMIAL DECOMPRESSION Right 2005   Arthroscopic (for rotator cuff and biceps tendon ruptures)   TEAR DUCT PROBING  11/13   Dr Vickki Muff   TOTAL KNEE ARTHROPLASTY Left  09/01/2014   Procedure: TOTAL KNEE ARTHROPLASTY;  Surgeon: Dereck Leep, MD;  Location: [facility];  Service: Orthopedics;  Laterality: Left;   TRIGGER FINGER RELEASE Left 02/25/2015   Procedure: LEFT LONG TRIGGER RELEASE;  Surgeon: Dereck Leep, MD;  Location: [facility];  Service: Orthopedics;  Laterality: Left;   VENOUS ABLATION      Allergies  Allergen Reactions   Lovenox [Enoxaparin] Hives   Proscar [Finasteride] Other (See Comments)    Upper body and arm weakness   Latex Rash    Has trouble with BANDAIDS that have been left on for more than 24 hours. Prefers paper tape.   Nickel Rash    Localized rash   Percocet [Oxycodone-Acetaminophen] Rash   Tape Rash and Other (See Comments)    Sensitivity     Allergies as of 02/03/2022       Reactions   Lovenox [enoxaparin] Hives   Proscar [finasteride] Other (See Comments)   Upper body and arm weakness   Latex Rash   Has trouble with BANDAIDS that have been left on for more than 24 hours. Prefers paper tape.   Nickel Rash   Localized rash   Percocet [oxycodone-acetaminophen] Rash   Tape Rash, Other (See Comments)   Sensitivity         Medication List        Accurate as of February 03, 2022  9:37 AM. If you have any questions, ask your nurse or doctor.          acetaminophen 500 MG tablet Commonly known as: TYLENOL Take 1,000 mg by mouth every 8 (eight) hours as needed.   APPLE CIDER VINEGAR PO Take 1 capsule by mouth at bedtime.   atorvastatin 80 MG tablet Commonly known as: LIPITOR Take 1 tablet (80 mg total) by mouth daily.   B-12 PO Take 1,000 mg by mouth daily.   cetirizine 5 MG tablet Commonly known as: ZYRTEC Take 5 mg by mouth daily.   Cranberry 250 MG Tabs Take 1 tablet by mouth 2 (two) times daily.   docusate sodium 100 MG capsule Commonly known as: COLACE Take 100 mg by mouth 2 (two) times daily.   Eliquis 2.5 MG Tabs tablet Generic drug: apixaban Take 2.5 mg by mouth 2 (two) times daily.    finasteride 5 MG tablet Commonly known as: PROSCAR Take 1 tablet (5 mg total) by mouth daily.   lidocaine 5 % Commonly known as: LIDODERM Place 1 patch onto the skin daily. Remove & Discard patch within 12 hours or as directed by MD   Magnesium Oxide 400 MG Caps Take 1 capsule (400 mg total) by mouth daily.   metFORMIN 500 MG tablet Commonly known as: GLUCOPHAGE TAKE 1 TABLET BY MOUTH  TWICE DAILY  WITH MEALS   methocarbamol 500 MG tablet Commonly known as: ROBAXIN Take 1 tablet (500 mg total) by mouth every 8 (eight) hours as needed for muscle spasms.   senna 8.6 MG Tabs tablet Commonly known as: SENOKOT Take 1 tablet (8.6 mg total) by mouth daily.   silver sulfADIAZINE 1 % cream Commonly known as: SILVADENE Apply 1 Application topically daily.   sodium chloride 0.9 % Soln by CRRT route as needed.  Use 1 liter intravenously every shift for fluid resuscitation 250cc/hr then decrease to 100cc/hr x one liter then d/c   SYSTANE ULTRA OP Place 1 drop into both eyes daily as needed (dry eyes).   tamsulosin 0.4 MG Caps capsule Commonly known as: FLOMAX Take 0.8 mg by mouth at bedtime.   traMADol 50 MG tablet Commonly known as: ULTRAM Take 1 tablet (50 mg total) by mouth every 6 (six) hours as needed for moderate pain.   triamterene-hydrochlorothiazide 37.5-25 MG capsule Commonly known as: DYAZIDE TAKE 1 CAPSULE BY MOUTH IN  THE MORNING   TUSSIN PO Take 10 mLs by mouth every 4 (four) hours as needed.        Review of Systems  Constitutional:  Negative for activity change, appetite change, fatigue and unexpected weight change.  HENT:  Negative for congestion and hearing loss.   Eyes: Negative.   Respiratory:  Negative for cough and shortness of breath.   Cardiovascular:  Negative for chest pain, palpitations and leg swelling.  Gastrointestinal:  Negative for abdominal pain, constipation and diarrhea.  Genitourinary:  Positive for difficulty urinating. Negative for  dysuria and hematuria.  Musculoskeletal:  Negative for arthralgias and myalgias.  Skin:  Negative for color change and wound.  Neurological:  Negative for dizziness and weakness.  Psychiatric/Behavioral:  Negative for agitation, behavioral problems and confusion.     Immunization History  Administered Date(s) Administered   Fluad Quad(high Dose 65+) 09/14/2018   H1N1 12/13/2007   Influenza Split 09/11/2010   Influenza Whole 09/12/2011   Influenza, High Dose Seasonal PF 09/20/2012, 09/07/2017   Influenza, Seasonal, Injecte, Preservative Fre 09/08/2007, 09/26/2008, 10/02/2014, 09/18/2015   Influenza,inj,Quad PF,6+ Mos 09/19/2013, 09/21/2015   Influenza-Unspecified 09/07/2017, 09/11/2019, 09/03/2020, 10/19/2021   Moderna Sars-Covid-2 Vaccination 01/18/2019, 02/15/2019, 10/17/2020, 06/01/2021   Pneumococcal Conjugate-13 09/19/2013   Pneumococcal Polysaccharide-23 09/26/2003, 06/28/2016   Td 08/04/1998   Tdap 06/04/2010, 08/15/2021   Tetanus 08/04/1998   Unspecified SARS-COV-2 Vaccination 06/01/2021   Zoster, Live 01/16/2004   Pertinent  Health Maintenance Due  Topic Date Due   HEMOGLOBIN A1C  08/25/2021   OPHTHALMOLOGY EXAM  04/17/2022   FOOT EXAM  08/20/2022   INFLUENZA VACCINE  Completed      12/05/2021    8:00 AM 12/05/2021   10:38 PM 12/06/2021    7:05 AM 12/06/2021    7:40 PM 12/07/2021    8:45 AM  Fall Risk  (RETIRED) Patient Fall Risk Level High fall risk Moderate fall risk High fall risk High fall risk High fall risk   Functional Status Survey:    Vitals:   02/03/22 0923  BP: 131/80  Pulse: 82  Resp: 18  Temp: (!) 97.5 F (36.4 C)  SpO2: 93%  Weight: 186 lb (84.4 kg)  Height: '5\' 10"'$  (1.778 m)   Body mass index is 26.69 kg/m. Physical Exam Constitutional:      General: He is not in acute distress.    Appearance: He is well-developed. He is not diaphoretic.  HENT:     Head: Normocephalic and atraumatic.  Right Ear: External ear normal.     Left Ear:  External ear normal.     Mouth/Throat:     Pharynx: No oropharyngeal exudate.  Eyes:     Conjunctiva/sclera: Conjunctivae normal.     Pupils: Pupils are equal, round, and reactive to light.  Cardiovascular:     Rate and Rhythm: Normal rate and regular rhythm.     Heart sounds: Normal heart sounds.  Pulmonary:     Effort: Pulmonary effort is normal.     Breath sounds: Normal breath sounds.  Abdominal:     General: Bowel sounds are normal.     Palpations: Abdomen is soft.  Musculoskeletal:        General: No tenderness.     Cervical back: Normal range of motion and neck supple.     Right lower leg: No edema.     Left lower leg: No edema.  Skin:    General: Skin is warm and dry.  Neurological:     Mental Status: He is alert and oriented to person, place, and time.     Motor: Weakness present.     Labs reviewed: Recent Labs    09/02/21 0311 09/03/21 0457 09/16/21 0949 12/04/21 0118 12/04/21 0500 12/06/21 0348  NA 138 137  --  137 136 132*  K 3.6 3.9  --  3.7 3.6 3.7  CL 109 105  --  106 105 103  CO2 25 25  --  '23 25 22  '$ GLUCOSE 125* 112*  --  186* 164* 166*  BUN 26* 22  --  26* 25* 26*  CREATININE 0.91 0.92  --  0.77 0.79 0.79  CALCIUM 10.0 10.5*  --  10.5* 9.8 9.8  MG 1.5* 1.6* 1.6  --   --   --    Recent Labs    02/25/21 1507  AST 15  ALT 11  ALKPHOS 66  BILITOT 0.8  PROT 6.9  ALBUMIN 3.8   Recent Labs    09/01/21 1139 12/04/21 0118 12/04/21 0500 12/06/21 0348 12/07/21 0623  WBC 7.4 12.3* 9.5 11.0* 10.6*  NEUTROABS 5.0 10.2*  --   --   --   HGB 14.0 12.0* 11.0* 10.4* 10.7*  HCT 41.7 34.8* 32.9* 30.8* 30.9*  MCV 94.3 92.3 93.7 93.1 93.1  PLT 134* 174 157 138* 139*   Lab Results  Component Value Date   TSH 0.76 12/11/2012   Lab Results  Component Value Date   HGBA1C 7.6 (H) 02/25/2021   Lab Results  Component Value Date   CHOL 131 02/25/2021   HDL 70.00 02/25/2021   LDLCALC 47 02/25/2021   TRIG 67.0 02/25/2021   CHOLHDL 2 02/25/2021     Significant Diagnostic Results in last 30 days:  No results found.  Assessment/Plan  1. Urinary retention due to benign prostatic hyperplasia -continues with foley catheter and follow up with urology   2. Loss of appetite -pt reports decrease interest in eating. Declines depression, trouble swallowing, pain with swallowing. Does not wish to be on medication to stimulate appetite. Discuss need for food and nutrition. He is aware of this and also reports he is aware of the natural consequences of not eating.   3. Mood changes Noted by staff, he denies low mood, depression or anxiety. He does not wish to be on medication for sleep or appetite.  He is A & O x 4 today. Will continue to monitor at this time.   Michael Colon. Harle Battiest [facility] [phone]

## 2022-02-03 NOTE — Telephone Encounter (Signed)
Late entry note: Receieved call yesterday afternoonthat patient has been less interactive and has tea-colored urine. Ordered 1L fluid over 8 hours. After initial 246m patient was more interactive and alert. Of note, patient had foley placed recently due to urinary retention. Will plan for CBC, BMP. Hold Eliquis and Triamterene/HCTZ. Patient will need an in person evaluation on following day.

## 2022-02-04 DIAGNOSIS — R278 Other lack of coordination: Secondary | ICD-10-CM | POA: Diagnosis not present

## 2022-02-04 DIAGNOSIS — M6281 Muscle weakness (generalized): Secondary | ICD-10-CM | POA: Diagnosis not present

## 2022-02-04 DIAGNOSIS — S42402D Unspecified fracture of lower end of left humerus, subsequent encounter for fracture with routine healing: Secondary | ICD-10-CM | POA: Diagnosis not present

## 2022-02-04 DIAGNOSIS — R2689 Other abnormalities of gait and mobility: Secondary | ICD-10-CM | POA: Diagnosis not present

## 2022-02-04 DIAGNOSIS — S42201D Unspecified fracture of upper end of right humerus, subsequent encounter for fracture with routine healing: Secondary | ICD-10-CM | POA: Diagnosis not present

## 2022-02-04 DIAGNOSIS — Z950 Presence of cardiac pacemaker: Secondary | ICD-10-CM | POA: Diagnosis not present

## 2022-02-04 DIAGNOSIS — D696 Thrombocytopenia, unspecified: Secondary | ICD-10-CM | POA: Diagnosis not present

## 2022-02-04 DIAGNOSIS — I495 Sick sinus syndrome: Secondary | ICD-10-CM | POA: Diagnosis not present

## 2022-02-04 DIAGNOSIS — M19122 Post-traumatic osteoarthritis, left elbow: Secondary | ICD-10-CM | POA: Diagnosis not present

## 2022-02-04 DIAGNOSIS — E1140 Type 2 diabetes mellitus with diabetic neuropathy, unspecified: Secondary | ICD-10-CM | POA: Diagnosis not present

## 2022-02-04 DIAGNOSIS — I442 Atrioventricular block, complete: Secondary | ICD-10-CM | POA: Diagnosis not present

## 2022-02-04 DIAGNOSIS — M7022 Olecranon bursitis, left elbow: Secondary | ICD-10-CM | POA: Diagnosis not present

## 2022-02-04 DIAGNOSIS — Z9181 History of falling: Secondary | ICD-10-CM | POA: Diagnosis not present

## 2022-02-04 DIAGNOSIS — R2681 Unsteadiness on feet: Secondary | ICD-10-CM | POA: Diagnosis not present

## 2022-02-04 DIAGNOSIS — R262 Difficulty in walking, not elsewhere classified: Secondary | ICD-10-CM | POA: Diagnosis not present

## 2022-02-05 DIAGNOSIS — S42201D Unspecified fracture of upper end of right humerus, subsequent encounter for fracture with routine healing: Secondary | ICD-10-CM | POA: Diagnosis not present

## 2022-02-05 DIAGNOSIS — R2689 Other abnormalities of gait and mobility: Secondary | ICD-10-CM | POA: Diagnosis not present

## 2022-02-05 DIAGNOSIS — M7022 Olecranon bursitis, left elbow: Secondary | ICD-10-CM | POA: Diagnosis not present

## 2022-02-05 DIAGNOSIS — R262 Difficulty in walking, not elsewhere classified: Secondary | ICD-10-CM | POA: Diagnosis not present

## 2022-02-05 DIAGNOSIS — M6281 Muscle weakness (generalized): Secondary | ICD-10-CM | POA: Diagnosis not present

## 2022-02-05 DIAGNOSIS — I442 Atrioventricular block, complete: Secondary | ICD-10-CM | POA: Diagnosis not present

## 2022-02-05 DIAGNOSIS — S42402D Unspecified fracture of lower end of left humerus, subsequent encounter for fracture with routine healing: Secondary | ICD-10-CM | POA: Diagnosis not present

## 2022-02-05 DIAGNOSIS — R2681 Unsteadiness on feet: Secondary | ICD-10-CM | POA: Diagnosis not present

## 2022-02-05 DIAGNOSIS — Z9181 History of falling: Secondary | ICD-10-CM | POA: Diagnosis not present

## 2022-02-05 DIAGNOSIS — M19122 Post-traumatic osteoarthritis, left elbow: Secondary | ICD-10-CM | POA: Diagnosis not present

## 2022-02-05 DIAGNOSIS — I495 Sick sinus syndrome: Secondary | ICD-10-CM | POA: Diagnosis not present

## 2022-02-05 DIAGNOSIS — D696 Thrombocytopenia, unspecified: Secondary | ICD-10-CM | POA: Diagnosis not present

## 2022-02-05 DIAGNOSIS — E1140 Type 2 diabetes mellitus with diabetic neuropathy, unspecified: Secondary | ICD-10-CM | POA: Diagnosis not present

## 2022-02-05 DIAGNOSIS — Z950 Presence of cardiac pacemaker: Secondary | ICD-10-CM | POA: Diagnosis not present

## 2022-02-05 DIAGNOSIS — R278 Other lack of coordination: Secondary | ICD-10-CM | POA: Diagnosis not present

## 2022-02-07 DIAGNOSIS — Z9181 History of falling: Secondary | ICD-10-CM | POA: Diagnosis not present

## 2022-02-07 DIAGNOSIS — M7022 Olecranon bursitis, left elbow: Secondary | ICD-10-CM | POA: Diagnosis not present

## 2022-02-07 DIAGNOSIS — R2689 Other abnormalities of gait and mobility: Secondary | ICD-10-CM | POA: Diagnosis not present

## 2022-02-07 DIAGNOSIS — I495 Sick sinus syndrome: Secondary | ICD-10-CM | POA: Diagnosis not present

## 2022-02-07 DIAGNOSIS — R262 Difficulty in walking, not elsewhere classified: Secondary | ICD-10-CM | POA: Diagnosis not present

## 2022-02-07 DIAGNOSIS — R278 Other lack of coordination: Secondary | ICD-10-CM | POA: Diagnosis not present

## 2022-02-07 DIAGNOSIS — E1140 Type 2 diabetes mellitus with diabetic neuropathy, unspecified: Secondary | ICD-10-CM | POA: Diagnosis not present

## 2022-02-07 DIAGNOSIS — R2681 Unsteadiness on feet: Secondary | ICD-10-CM | POA: Diagnosis not present

## 2022-02-07 DIAGNOSIS — I442 Atrioventricular block, complete: Secondary | ICD-10-CM | POA: Diagnosis not present

## 2022-02-07 DIAGNOSIS — M19122 Post-traumatic osteoarthritis, left elbow: Secondary | ICD-10-CM | POA: Diagnosis not present

## 2022-02-07 DIAGNOSIS — S42402D Unspecified fracture of lower end of left humerus, subsequent encounter for fracture with routine healing: Secondary | ICD-10-CM | POA: Diagnosis not present

## 2022-02-07 DIAGNOSIS — S42201D Unspecified fracture of upper end of right humerus, subsequent encounter for fracture with routine healing: Secondary | ICD-10-CM | POA: Diagnosis not present

## 2022-02-07 DIAGNOSIS — Z950 Presence of cardiac pacemaker: Secondary | ICD-10-CM | POA: Diagnosis not present

## 2022-02-07 DIAGNOSIS — D696 Thrombocytopenia, unspecified: Secondary | ICD-10-CM | POA: Diagnosis not present

## 2022-02-07 DIAGNOSIS — M6281 Muscle weakness (generalized): Secondary | ICD-10-CM | POA: Diagnosis not present

## 2022-02-08 ENCOUNTER — Emergency Department: Payer: Medicare Other

## 2022-02-08 ENCOUNTER — Emergency Department
Admission: EM | Admit: 2022-02-08 | Discharge: 2022-02-08 | Disposition: A | Discharge status: Home / Self Care | Payer: Medicare Other | Attending: Emergency Medicine | Admitting: Emergency Medicine

## 2022-02-08 ENCOUNTER — Encounter: Payer: Self-pay | Admitting: Radiology

## 2022-02-08 ENCOUNTER — Other Ambulatory Visit: Payer: Self-pay

## 2022-02-08 DIAGNOSIS — K625 Hemorrhage of anus and rectum: Secondary | ICD-10-CM | POA: Diagnosis not present

## 2022-02-08 DIAGNOSIS — R102 Pelvic and perineal pain: Secondary | ICD-10-CM | POA: Insufficient documentation

## 2022-02-08 DIAGNOSIS — Z8673 Personal history of transient ischemic attack (TIA), and cerebral infarction without residual deficits: Secondary | ICD-10-CM | POA: Diagnosis not present

## 2022-02-08 DIAGNOSIS — Z950 Presence of cardiac pacemaker: Secondary | ICD-10-CM | POA: Insufficient documentation

## 2022-02-08 DIAGNOSIS — K6289 Other specified diseases of anus and rectum: Secondary | ICD-10-CM | POA: Diagnosis not present

## 2022-02-08 DIAGNOSIS — E1122 Type 2 diabetes mellitus with diabetic chronic kidney disease: Secondary | ICD-10-CM | POA: Diagnosis not present

## 2022-02-08 DIAGNOSIS — N189 Chronic kidney disease, unspecified: Secondary | ICD-10-CM | POA: Diagnosis not present

## 2022-02-08 DIAGNOSIS — J45909 Unspecified asthma, uncomplicated: Secondary | ICD-10-CM | POA: Insufficient documentation

## 2022-02-08 DIAGNOSIS — I2510 Atherosclerotic heart disease of native coronary artery without angina pectoris: Secondary | ICD-10-CM | POA: Diagnosis not present

## 2022-02-08 DIAGNOSIS — R7989 Other specified abnormal findings of blood chemistry: Secondary | ICD-10-CM | POA: Diagnosis not present

## 2022-02-08 DIAGNOSIS — R319 Hematuria, unspecified: Secondary | ICD-10-CM | POA: Diagnosis not present

## 2022-02-08 DIAGNOSIS — K5641 Fecal impaction: Secondary | ICD-10-CM | POA: Diagnosis not present

## 2022-02-08 DIAGNOSIS — I129 Hypertensive chronic kidney disease with stage 1 through stage 4 chronic kidney disease, or unspecified chronic kidney disease: Secondary | ICD-10-CM | POA: Diagnosis not present

## 2022-02-08 DIAGNOSIS — Z87440 Personal history of urinary (tract) infections: Secondary | ICD-10-CM | POA: Insufficient documentation

## 2022-02-08 DIAGNOSIS — K869 Disease of pancreas, unspecified: Secondary | ICD-10-CM | POA: Diagnosis not present

## 2022-02-08 DIAGNOSIS — N3289 Other specified disorders of bladder: Secondary | ICD-10-CM | POA: Diagnosis not present

## 2022-02-08 DIAGNOSIS — Z7901 Long term (current) use of anticoagulants: Secondary | ICD-10-CM | POA: Insufficient documentation

## 2022-02-08 DIAGNOSIS — I700 Atherosclerosis of aorta: Secondary | ICD-10-CM | POA: Diagnosis not present

## 2022-02-08 DIAGNOSIS — K8020 Calculus of gallbladder without cholecystitis without obstruction: Secondary | ICD-10-CM | POA: Insufficient documentation

## 2022-02-08 DIAGNOSIS — Z743 Need for continuous supervision: Secondary | ICD-10-CM | POA: Diagnosis not present

## 2022-02-08 DIAGNOSIS — R58 Hemorrhage, not elsewhere classified: Secondary | ICD-10-CM | POA: Diagnosis not present

## 2022-02-08 DIAGNOSIS — R0902 Hypoxemia: Secondary | ICD-10-CM | POA: Diagnosis not present

## 2022-02-08 LAB — CBC
HCT: 39 % (ref 39.0–52.0)
Hemoglobin: 12.5 g/dL — ABNORMAL LOW (ref 13.0–17.0)
MCH: 30.2 pg (ref 26.0–34.0)
MCHC: 32.1 g/dL (ref 30.0–36.0)
MCV: 94.2 fL (ref 80.0–100.0)
Platelets: 189 10*3/uL (ref 150–400)
RBC: 4.14 MIL/uL — ABNORMAL LOW (ref 4.22–5.81)
RDW: 14.6 % (ref 11.5–15.5)
WBC: 11.9 10*3/uL — ABNORMAL HIGH (ref 4.0–10.5)
nRBC: 0 % (ref 0.0–0.2)

## 2022-02-08 LAB — TYPE AND SCREEN
ABO/RH(D): AB NEG
Antibody Screen: NEGATIVE

## 2022-02-08 LAB — URINALYSIS, ROUTINE W REFLEX MICROSCOPIC
Bilirubin Urine: NEGATIVE
Glucose, UA: NEGATIVE mg/dL
Ketones, ur: NEGATIVE mg/dL
Nitrite: NEGATIVE
Protein, ur: 100 mg/dL — AB
RBC / HPF: >50 RBC/hpf (ref 0–5)
Specific Gravity, Urine: 1.019 (ref 1.005–1.030)
Squamous Epithelial / HPF: NONE SEEN /HPF (ref 0–5)
WBC, UA: >50 WBC/hpf (ref 0–5)
pH: 5 (ref 5.0–8.0)

## 2022-02-08 LAB — COMPREHENSIVE METABOLIC PANEL
ALT: 25 U/L (ref 0–44)
AST: 42 U/L — ABNORMAL HIGH (ref 15–41)
Albumin: 2.9 g/dL — ABNORMAL LOW (ref 3.5–5.0)
Alkaline Phosphatase: 77 U/L (ref 38–126)
Anion gap: 14 (ref 5–15)
BUN: 50 mg/dL — ABNORMAL HIGH (ref 8–23)
CO2: 24 mmol/L (ref 22–32)
Calcium: 10 mg/dL (ref 8.9–10.3)
Chloride: 96 mmol/L — ABNORMAL LOW (ref 98–111)
Creatinine, Ser: 1.2 mg/dL (ref 0.61–1.24)
GFR, Estimated: 58 mL/min — ABNORMAL LOW (ref >60)
Glucose, Bld: 281 mg/dL — ABNORMAL HIGH (ref 70–99)
Potassium: 3.9 mmol/L (ref 3.5–5.1)
Sodium: 134 mmol/L — ABNORMAL LOW (ref 135–145)
Total Bilirubin: 1 mg/dL (ref 0.3–1.2)
Total Protein: 6.6 g/dL (ref 6.5–8.1)

## 2022-02-08 LAB — PROTIME-INR
INR: 1.4 — ABNORMAL HIGH (ref 0.8–1.2)
Prothrombin Time: 16.6 seconds — ABNORMAL HIGH (ref 11.4–15.2)

## 2022-02-08 MED ORDER — POLYETHYLENE GLYCOL 3350 17 G PO PACK: 17.0000 g | PACK | Freq: Two times a day (BID) | ORAL | 0 refills | Status: DC

## 2022-02-08 MED ORDER — FLEET ENEMA 7-19 GM/118ML RE ENEM
1.0000 | ENEMA | Freq: Once | RECTAL | Status: AC
  Administered 2022-02-08: 1 via RECTAL

## 2022-02-08 MED ORDER — IOHEXOL 300 MG/ML  SOLN
100.0000 mL | Freq: Once | INTRAMUSCULAR | Status: AC | PRN
  Administered 2022-02-08: 100 mL via INTRAVENOUS

## 2022-02-08 MED ORDER — FENTANYL CITRATE PF 50 MCG/ML IJ SOSY
50.0000 ug | PREFILLED_SYRINGE | Freq: Once | INTRAMUSCULAR | Status: AC
  Administered 2022-02-08: 50 ug via INTRAVENOUS
  Filled 2022-02-08: qty 1

## 2022-02-08 NOTE — ED Notes (Signed)
Pt out of dept with [facility] after bedside report given

## 2022-02-08 NOTE — ED Provider Notes (Signed)
[facility] Provider Note    Event Date/Time   First MD Initiated Contact with Patient 02/08/22 1637     (approximate)   History   Rectal Bleeding   HPI  Michael Colon is a 89 y.o. male past medical history of atrial flutter on eliquis, BPH, complete heart block status post pacemaker, type 2 diabetes hypertension hyperlipidemia presents with rectal bleeding.  Patient's son tells me that the facility said that he had bright red blood per rectum today.  They were not able to quantify.  Patient did not see it so he is not sure what it looked like or how much.  Apparently there was also some bleeding in his urine as well.  He is being treated for UTI and had Foley placed just several days ago.  Patient complains of pain in the abdomen and pelvic region.  Apparently did have a fall several days ago at the facility, facility did not think he hit his head.  Past Medical History:  Diagnosis Date   Asthma    Atrial flutter ([place]) 2007   Band keratopathy    Benign prostatic hypertrophy    Chronic ear infection    Chronic kidney disease    Chronic prostatitis    Dental bridge present    permanent - upper   Diabetes mellitus    Elbow stiffness, left    s/p fracture many yrs ago.  arm does not straighten   GERD (gastroesophageal reflux disease)    laryngeal involvement   Hyperlipidemia    Hypertension    Obstructive sleep apnea    CPAP-9   Osteoarthrosis, localized, primary, knee    post-traumatic   Prostatitis    Stroke ([place])    TIA   Tachy-brady syndrome ([place])    UTI (urinary tract infection)     Patient Active Problem List   Diagnosis Date Noted   Fall 12/04/2021   Proximal humerus fracture_right 12/04/2021   UTI (urinary tract infection) 12/04/2021   HLD (hyperlipidemia) 12/04/2021   Stroke ([place]) 12/04/2021   Atrial fibrillation, chronic ([place]) 12/04/2021   Left elbow fracture 11/30/2021   Leg ulcer ([place]) 11/08/2021   Complete heart block ([place])  09/01/2021   Hand and wrist extensor tendon rupture 02/26/2021   Arthritis of left wrist 02/26/2021   Orchitis of right testicle 11/30/2020   Onychomycosis 11/30/2020   Hematoma 03/06/2020   Chronic diastolic heart failure ([name]) 10/25/2019   Hypercalcemia 10/25/2019   Venous stasis ulcer ([place]) 08/20/2019   Chondrocalcinosis 09/05/2018   Primary osteoarthritis of right knee 08/19/2018   Aortic atherosclerosis ([place]) 12/28/2016   Chronic venous insufficiency 04/12/2016   Thrombocytopenia ([place]) 06/25/2015   Obstructive sleep apnea 06/25/2015   Enlarged prostate without lower urinary tract symptoms (luts) 10/14/2014   Advanced directives, counseling/discussion 12/13/2013   Encounter for therapeutic drug monitoring 01/30/2013   Routine general medical examination at a health care facility 12/11/2012   TIA (transient ischemic attack) 12/11/2012   Ventricular tachycardia-pause dependent 10/16/2012   Sinus bradycardia 09/07/2012   Bradycardia 09/07/2012   Edema 09/07/2012   Localized edema 09/07/2012   Mixed hyperlipidemia    Abnormal EKG 09/26/2011   Atrial flutter ([place]) 07/21/2010   Encounter for anticoagulation discussion and counseling 07/21/2010   Type 2 diabetes, controlled, with neuropathy ([place])    Tachy-brady syndrome ([place])    BPH with obstruction/lower urinary tract symptoms    Unspecified asthma(493.90)    GERD (gastroesophageal reflux disease)    Essential hypertension  Physical Exam  Triage Vital Signs: ED Triage Vitals  Enc Vitals Group     BP 02/08/22 1458 132/84     Pulse Rate 02/08/22 1458 80     Resp 02/08/22 1458 19     Temp 02/08/22 1458 98 F (36.7 C)     Temp src --      SpO2 02/08/22 1458 100 %     Weight --      Height --      Head Circumference --      Peak Flow --      Pain Score 02/08/22 1435 8     Pain Loc --      Pain Edu? --      Excl. in [place]? --     Most recent vital signs: Vitals:   02/08/22 1458 02/08/22 1800  BP: 132/84 105/75   Pulse: 80 97  Resp: 19 19  Temp: 98 F (36.7 C)   SpO2: 100% 97%     General: Awake, patient looks uncomfortable CV:  Good peripheral perfusion.  Resp:  Normal effort.  Abd:  No distention.  Abdomen is diffusely tender mildly distended voluntary guarding throughout Neuro:             Awake, Alert, Oriented x 3  Other:  Rectum is distended with large stool ball there is brown stool no gross blood Foley catheter placed draining cola colored urine   ED Results / Procedures / Treatments  Labs (all labs ordered are listed, but only abnormal results are displayed) Labs Reviewed  COMPREHENSIVE METABOLIC PANEL - Abnormal; Notable for the following components:      Result Value   Sodium 134 (*)    Chloride 96 (*)    Glucose, Bld 281 (*)    BUN 50 (*)    Albumin 2.9 (*)    AST 42 (*)    GFR, Estimated 58 (*)    All other components within normal limits  CBC - Abnormal; Notable for the following components:   WBC 11.9 (*)    RBC 4.14 (*)    Hemoglobin 12.5 (*)    All other components within normal limits  PROTIME-INR - Abnormal; Notable for the following components:   Prothrombin Time 16.6 (*)    INR 1.4 (*)    All other components within normal limits  URINALYSIS, ROUTINE W REFLEX MICROSCOPIC - Abnormal; Notable for the following components:   Color, Urine AMBER (*)    APPearance CLOUDY (*)    Hgb urine dipstick LARGE (*)    Protein, ur 100 (*)    Leukocytes,Ua TRACE (*)    Bacteria, UA MANY (*)    All other components within normal limits  URINE CULTURE  POC OCCULT BLOOD, ED  TYPE AND SCREEN     EKG     RADIOLOGY I reviewed interpreted CT of the abdomen pelvis which shows large stool ball and distended rectum   PROCEDURES:  Critical Care performed: No  Fecal disimpaction  Date/Time: 02/08/2022 9:13 PM  Performed by: Rada Hay, MD Authorized by: Rada Hay, MD  Consent: Verbal consent obtained. Consent given by: patient Patient identity  confirmed: verbally with patient Time out: Immediately prior to procedure a "time out" was called to verify the correct patient, procedure, equipment, support staff and site/side marked as required. Local anesthesia used: no  Anesthesia: Local anesthesia used: no  Sedation: Patient sedated: no  Patient tolerance: patient tolerated the procedure well with no immediate complications  The patient is on the cardiac monitor to evaluate for evidence of arrhythmia and/or significant heart rate changes.   MEDICATIONS ORDERED IN ED: Medications  iohexol (OMNIPAQUE) 300 MG/ML solution 100 mL (100 mLs Intravenous Contrast Given 02/08/22 1717)  fentaNYL (SUBLIMAZE) injection 50 mcg (50 mcg Intravenous Given 02/08/22 1839)  sodium phosphate (FLEET) 7-19 GM/118ML enema 1 enema (1 enema Rectal Given 02/08/22 2039)     IMPRESSION / MDM / ASSESSMENT AND PLAN / ED COURSE  I reviewed the triage vital signs and the nursing notes.                              Patient's presentation is most consistent with acute presentation with potential threat to life or bodily function.  Differential diagnosis includes, but is not limited to, fecal impaction, bowel obstruction, stercoral colitis, rectal ulcer, diverticulosis, malignancy  The patient is a 89 year old male past medical history of A-fib on Eliquis who presents today with abdominal pain and rectal bleeding.  Patient is at [facility] they called the patient's son saying that he had bright red blood per rectum around 2 PM today.  Patient did not see any bleeding so he is not aware of how much or what it looked like.  He does endorse diffuse lower abdominal pain as well as pain in the pelvic region.  He was recently started on ciprofloxacin several days ago for UTI and had Foley catheter placed due to urinary retention.  Patient's vitals are reassuring.  On exam he looks uncomfortable with abdomen is somewhat diffusely tender and he has a large fecal  impaction in the rectum.  Stool was brown.  Foley is draining cola colored urine.  Given bleeding I am concerned for possible stercoral ulcer and he is also at risk for bowel obstruction so we will obtain CT of abdomen pelvis.  Will check labs and urine.  Patient's labs are overall reassuring hemoglobin 12.5 which is higher than his baseline.  Creatinine mildly increased from baseline but not true AKI.  Urine with greater than 50 white cells and red cells.  I suspect his urinary retention is in part due to his fecal impaction which also could have led to UTI.  He is anticoagulated which is likely causing hematuria however there is no gross blood or clots in the Foley and it is draining well so do not feel strongly about irrigating at this time.  Patient CT shows a large fecal impaction with distended rectum no findings of stercoral lightest or stercoral ulcer no bowel obstruction.  I performed a manual disimpaction which was successful.  Patient was then given an enema and had per nursing very large bowel movement.  Patient did not have any significant rectal bleeding observed in the ED.  He feels much improved after these interventions.  I think given he is not having active bleeding he has no bowel obstruction and that this fecal impaction seems to have been significantly proved that he can safely be discharged.  Discussed with both him and his son possibility of admission for further cleanout he did not feel that this was necessary.  Will start on aggressive bowel regimen.  He is taking docusate already will start on MiraLAX twice a day.  Also recommended enemas or suppositories is not having good BMs with MiraLAX.        FINAL CLINICAL IMPRESSION(S) / ED DIAGNOSES   Final diagnoses:  Fecal impaction ([place])  Rx / DC Orders   ED Discharge Orders     None        Note:  This document was prepared using Dragon voice recognition software and may include unintentional dictation errors.    Rada Hay, MD 02/08/22 2119

## 2022-02-08 NOTE — ED Notes (Signed)
Pt assisted with enema. Large hard bowel movement noted followed by multiple large soft bowel movements. Pt voices slight relief. MD Mchugh made aware

## 2022-02-08 NOTE — ED Triage Notes (Signed)
Pt comes via EMS from [facility] with c/o rectal bleeding that started today. Pt is having it from rectal and penis. Pt has hx of this and is on thinners. Pt did have fall yesterday and unknown if any injuries.   Pt is A&O. Pt stats 8/10 pain all over. Pt does have some tenderness and swelling to abdomen area. Pt does have pacemaker.   Pt does have left arm contracture. BP and IV to right arm only.

## 2022-02-08 NOTE — Discharge Instructions (Signed)
We found that you had a fecal impaction.  You were both manually disimpacted and received an enema.  There was no rectal bleeding.  You do likely have a UTI so she should continue the ciprofloxacin.  In addition to the docusate you are taking please start taking MiraLAX twice a day.  If you are not having regular bowel movements you can increase to 2 capfuls twice a day.  You can also add on suppositories or enemas if you are not having regular bowel movements.

## 2022-02-08 NOTE — ED Notes (Signed)
Called [facility] to arrange transport back to [facility] waiting on arrival

## 2022-02-08 NOTE — ED Notes (Signed)
Pt Dc to  [facility]. Report called to [facility]. IV removed, cath intact, pressure dressing applied. No bleeding noted at site. Pt remains in room 25 awaiting EMS transport

## 2022-02-08 NOTE — Progress Notes (Unsigned)
02/09/2022 8:56 AM   Michael Colon [birth year] [id number]  Referring provider: Dewayne Shorter, MD [place],  [place] [id number]  Urological history: 1. BPH w/ LU TS/Retention -tamsulosin 0.8 mg daily and finasteride 5 mg daily   2. Right epididymitis -scrotal US (11/2020) - Possible right-sided epididymitis  3. Left hydrocele -scrotal US (11/2020) - Small left hydrocele is noted.   4. Left varicocele -scrotal US (11/2020) - Possible small left varicocele   5. Remote history of prostatitis   Chief Complaint  Patient presents with   Acute Visit    Patient not voiding, foley placed    HPI: Michael Colon is a 89 y.o. male who presents today for urinary retention.  He went into retention sometime around 01/31/2021.  He was seen in the ED yesterday for impaction.    Foley catheter is removed without incident.    PMH: Past Medical History:  Diagnosis Date   Asthma    Atrial flutter ([place]) 2007   Band keratopathy    Benign prostatic hypertrophy    Chronic ear infection    Chronic kidney disease    Chronic prostatitis    Dental bridge present    permanent - upper   Diabetes mellitus    Elbow stiffness, left    s/p fracture many yrs ago.  arm does not straighten   GERD (gastroesophageal reflux disease)    laryngeal involvement   Hyperlipidemia    Hypertension    Obstructive sleep apnea    CPAP-9   Osteoarthrosis, localized, primary, knee    post-traumatic   Prostatitis    Stroke ([place])    TIA   Tachy-brady syndrome ([place])    UTI (urinary tract infection)     Surgical History: Past Surgical History:  Procedure Laterality Date   APPENDECTOMY     BELPHAROPTOSIS REPAIR     Dr [name]---didn't resolve weepy eye and eyelid drooping   CARDIOVERSION  04/13/2010   CATARACT EXTRACTION, BILATERAL  2009   Chest pain  8/12   Stress test benign   ESOPHAGEAL DILATION  05/26/2016   Procedure: ESOPHAGEAL DILATION;  Surgeon: Lucilla Lame, MD;  Location:  [facility];  Service: Endoscopy;;   ESOPHAGOGASTRODUODENOSCOPY (EGD) WITH PROPOFOL N/A 05/26/2016   Procedure: ESOPHAGOGASTRODUODENOSCOPY (EGD) WITH PROPOFOL;  Surgeon: Lucilla Lame, MD;  Location: [facility];  Service: Endoscopy;  Laterality: N/A;  Diabetic - oral meds sleep apnea   EYE SURGERY     FRACTURE SURGERY Left    elbow   KNEE SURGERY  1998   plate after fracture, then removed for infection   left elbow surgery     MASTOIDECTOMY  8/08   Dr Idelle Crouch   PACEMAKER IMPLANT N/A 09/02/2021   Procedure: PACEMAKER IMPLANT;  Surgeon: Vickie Epley, MD;  Location: [facility];  Service: Cardiovascular;  Laterality: N/A;   RHINOPLASTY  5/10   and septoplasty   SUBACROMIAL DECOMPRESSION Right 2005   Arthroscopic (for rotator cuff and biceps tendon ruptures)   TEAR DUCT PROBING  11/13   Dr Vickki Muff   TOTAL KNEE ARTHROPLASTY Left 09/01/2014   Procedure: TOTAL KNEE ARTHROPLASTY;  Surgeon: Dereck Leep, MD;  Location: [facility];  Service: Orthopedics;  Laterality: Left;   TRIGGER FINGER RELEASE Left 02/25/2015   Procedure: LEFT LONG TRIGGER RELEASE;  Surgeon: Dereck Leep, MD;  Location: [facility];  Service: Orthopedics;  Laterality: Left;   VENOUS ABLATION      Home Medications:  Allergies  as of 02/09/2022       Reactions   Lovenox [enoxaparin] Hives   Proscar [finasteride] Other (See Comments)   Upper body and arm weakness   Latex Rash   Has trouble with BANDAIDS that have been left on for more than 24 hours. Prefers paper tape.   Nickel Rash   Localized rash   Percocet [oxycodone-acetaminophen] Rash   Tape Rash, Other (See Comments)   Sensitivity         Medication List        Accurate as of February 09, 2022  8:56 AM. If you have any questions, ask your nurse or doctor.          acetaminophen 500 MG tablet Commonly known as: TYLENOL Take 1,000 mg by mouth every 8 (eight) hours as needed.   APPLE CIDER VINEGAR PO Take 1 capsule by mouth  at bedtime.   atorvastatin 80 MG tablet Commonly known as: LIPITOR Take 1 tablet (80 mg total) by mouth daily.   B-12 PO Take 1,000 mg by mouth daily.   cetirizine 5 MG tablet Commonly known as: ZYRTEC Take 5 mg by mouth daily.   ciprofloxacin 500 MG tablet Commonly known as: CIPRO Take 500 mg by mouth 2 (two) times daily.   Cranberry 250 MG Tabs Take 1 tablet by mouth 2 (two) times daily.   docusate sodium 100 MG capsule Commonly known as: COLACE Take 100 mg by mouth 2 (two) times daily.   Eliquis 2.5 MG Tabs tablet Generic drug: apixaban Take 2.5 mg by mouth 2 (two) times daily.   finasteride 5 MG tablet Commonly known as: PROSCAR Take 1 tablet (5 mg total) by mouth daily.   lidocaine 5 % Commonly known as: LIDODERM Place 1 patch onto the skin daily. Remove & Discard patch within 12 hours or as directed by MD   Magnesium Oxide 400 MG Caps Take 1 capsule (400 mg total) by mouth daily.   metFORMIN 500 MG tablet Commonly known as: GLUCOPHAGE TAKE 1 TABLET BY MOUTH  TWICE DAILY WITH MEALS   methocarbamol 500 MG tablet Commonly known as: ROBAXIN Take 1 tablet (500 mg total) by mouth every 8 (eight) hours as needed for muscle spasms.   polyethylene glycol 17 g packet Commonly known as: MiraLax Take 17 g by mouth 2 (two) times daily.   senna 8.6 MG Tabs tablet Commonly known as: SENOKOT Take 1 tablet (8.6 mg total) by mouth daily.   silver sulfADIAZINE 1 % cream Commonly known as: SILVADENE Apply 1 Application topically daily.   sodium chloride 0.9 % Soln by CRRT route as needed.  Use 1 liter intravenously every shift for fluid resuscitation 250cc/hr then decrease to 100cc/hr x one liter then d/c   SYSTANE ULTRA OP Place 1 drop into both eyes daily as needed (dry eyes).   tamsulosin 0.4 MG Caps capsule Commonly known as: FLOMAX Take 0.8 mg by mouth at bedtime.   traMADol 50 MG tablet Commonly known as: ULTRAM Take 1 tablet (50 mg total) by mouth every  6 (six) hours as needed for moderate pain.   triamterene-hydrochlorothiazide 37.5-25 MG capsule Commonly known as: DYAZIDE TAKE 1 CAPSULE BY MOUTH IN  THE MORNING   TUSSIN PO Take 10 mLs by mouth every 4 (four) hours as needed.        Allergies:  Allergies  Allergen Reactions   Lovenox [Enoxaparin] Hives   Proscar [Finasteride] Other (See Comments)    Upper body and arm weakness  Latex Rash    Has trouble with BANDAIDS that have been left on for more than 24 hours. Prefers paper tape.   Nickel Rash    Localized rash   Percocet [Oxycodone-Acetaminophen] Rash   Tape Rash and Other (See Comments)    Sensitivity     Family History: Family History  Problem Relation Age of Onset   Colon cancer Father    Diabetes Father    Hypertension Father    Other Mother        natural causes   Heart attack Neg Hx    Stroke Neg Hx     Social History:  reports that he quit smoking about 53 years ago. His smoking use included cigarettes. He smoked an average of 1.00 packs per day. He has never been exposed to tobacco smoke. He has never used smokeless tobacco. He reports that he does not drink alcohol and does not use drugs.  ROS: Pertinent ROS in HPI  Physical Exam: Ht '5\' 10"'$  (1.778 m)   Wt 186 lb (84.4 kg)   BMI 26.69 kg/m   Constitutional:  Well nourished. Alert and oriented, No acute distress. HEENT: [place] AT, moist mucus membranes.  Trachea midline Cardiovascular: No clubbing, cyanosis, or edema. Respiratory: Normal respiratory effort, no increased work of breathing. GU: No CVA tenderness.  No bladder fullness or masses.  Patient with uncircumcised phallus. Foreskin easily retracted  Urethral meatus is patent.  No penile discharge. No penile lesions or rashes. Scrotum without lesions, cysts, rashes and/or edema.   Neurologic: Grossly intact, no focal deficits, moving all 4 extremities. Psychiatric: Normal mood and affect.  Laboratory Data: Lab Results  Component Value Date    WBC 11.9 (H) 02/08/2022   HGB 12.5 (L) 02/08/2022   HCT 39.0 02/08/2022   MCV 94.2 02/08/2022   PLT 189 02/08/2022    Lab Results  Component Value Date   CREATININE 1.20 02/08/2022    Lab Results  Component Value Date   HGBA1C 7.6 (H) 02/25/2021       Component Value Date/Time   CHOL 131 02/25/2021 1507   HDL 70.00 02/25/2021 1507   CHOLHDL 2 02/25/2021 1507   VLDL 13.4 02/25/2021 1507   LDLCALC 47 02/25/2021 1507    Lab Results  Component Value Date   AST 42 (H) 02/08/2022   Lab Results  Component Value Date   ALT 25 02/08/2022    Urinalysis    Component Value Date/Time   COLORURINE AMBER (A) 02/08/2022 1749   APPEARANCEUR CLOUDY (A) 02/08/2022 1749   APPEARANCEUR Cloudy (A) 01/14/2016 1536   LABSPEC 1.019 02/08/2022 1749   LABSPEC 1.018 09/08/2013 1854   PHURINE 5.0 02/08/2022 1749   GLUCOSEU NEGATIVE 02/08/2022 1749   GLUCOSEU NEGATIVE 11/30/2020 1554   HGBUR LARGE (A) 02/08/2022 1749   BILIRUBINUR NEGATIVE 02/08/2022 1749   BILIRUBINUR Negative 01/14/2016 1536   BILIRUBINUR Negative 09/08/2013 1854   KETONESUR NEGATIVE 02/08/2022 1749   PROTEINUR 100 (A) 02/08/2022 1749   UROBILINOGEN 2.0 (A) 11/30/2020 1554   NITRITE NEGATIVE 02/08/2022 1749   LEUKOCYTESUR TRACE (A) 02/08/2022 1749   LEUKOCYTESUR 1+ 09/08/2013 1854  I have reviewed the labs.   Pertinent Imaging: N/A   Catheter Removal Patient is present today for a catheter removal.  9 ml of water was drained from the balloon. A 16FR foley cath was removed from the bladder, no complications were noted. Patient tolerated well.  Assessment & Plan:    1. Urinary retention -Foley catheter removed this am  -  continue tamsulosin and finasteride  -return this afternoon for PVR   Return for this pm for PVR .  These notes generated with voice recognition software. I apologize for typographical errors.  [place], [place] [address]  [place] [address], [place] [address] [phone]

## 2022-02-08 NOTE — ED Notes (Signed)
Report called to [facility], [employer] [employer], and [facility].

## 2022-02-09 ENCOUNTER — Encounter: Payer: Self-pay | Admitting: Urology

## 2022-02-09 ENCOUNTER — Encounter: Payer: Self-pay | Admitting: Physician Assistant

## 2022-02-09 ENCOUNTER — Ambulatory Visit (INDEPENDENT_AMBULATORY_CARE_PROVIDER_SITE_OTHER): Payer: Medicare Other | Admitting: Physician Assistant

## 2022-02-09 ENCOUNTER — Telehealth: Payer: Self-pay

## 2022-02-09 ENCOUNTER — Ambulatory Visit (INDEPENDENT_AMBULATORY_CARE_PROVIDER_SITE_OTHER): Payer: Medicare Other | Admitting: Urology

## 2022-02-09 VITALS — Ht 70.0 in | Wt 186.0 lb

## 2022-02-09 DIAGNOSIS — E1140 Type 2 diabetes mellitus with diabetic neuropathy, unspecified: Secondary | ICD-10-CM | POA: Diagnosis not present

## 2022-02-09 DIAGNOSIS — S42201D Unspecified fracture of upper end of right humerus, subsequent encounter for fracture with routine healing: Secondary | ICD-10-CM | POA: Diagnosis not present

## 2022-02-09 DIAGNOSIS — I442 Atrioventricular block, complete: Secondary | ICD-10-CM | POA: Diagnosis not present

## 2022-02-09 DIAGNOSIS — I495 Sick sinus syndrome: Secondary | ICD-10-CM | POA: Diagnosis not present

## 2022-02-09 DIAGNOSIS — R278 Other lack of coordination: Secondary | ICD-10-CM | POA: Diagnosis not present

## 2022-02-09 DIAGNOSIS — D696 Thrombocytopenia, unspecified: Secondary | ICD-10-CM | POA: Diagnosis not present

## 2022-02-09 DIAGNOSIS — R338 Other retention of urine: Secondary | ICD-10-CM

## 2022-02-09 DIAGNOSIS — M6281 Muscle weakness (generalized): Secondary | ICD-10-CM | POA: Diagnosis not present

## 2022-02-09 DIAGNOSIS — R2689 Other abnormalities of gait and mobility: Secondary | ICD-10-CM | POA: Diagnosis not present

## 2022-02-09 DIAGNOSIS — R262 Difficulty in walking, not elsewhere classified: Secondary | ICD-10-CM | POA: Diagnosis not present

## 2022-02-09 DIAGNOSIS — S42402D Unspecified fracture of lower end of left humerus, subsequent encounter for fracture with routine healing: Secondary | ICD-10-CM | POA: Diagnosis not present

## 2022-02-09 DIAGNOSIS — M19122 Post-traumatic osteoarthritis, left elbow: Secondary | ICD-10-CM | POA: Diagnosis not present

## 2022-02-09 DIAGNOSIS — Z9181 History of falling: Secondary | ICD-10-CM | POA: Diagnosis not present

## 2022-02-09 DIAGNOSIS — R2681 Unsteadiness on feet: Secondary | ICD-10-CM | POA: Diagnosis not present

## 2022-02-09 DIAGNOSIS — M7022 Olecranon bursitis, left elbow: Secondary | ICD-10-CM | POA: Diagnosis not present

## 2022-02-09 DIAGNOSIS — Z950 Presence of cardiac pacemaker: Secondary | ICD-10-CM | POA: Diagnosis not present

## 2022-02-09 LAB — BLADDER SCAN AMB NON-IMAGING

## 2022-02-09 LAB — URINE CULTURE: Culture: NO GROWTH

## 2022-02-09 NOTE — Progress Notes (Signed)
Afternoon follow-up  Patient returned to clinic this afternoon for repeat PVR. He reports drinking approximately 6oz of fluid. He does not think he has voided. He denies the urge to void or bladder discomfort; he reports generalized discomfort. He is unsure if he's had additional bowel movements since leaving the ED yesterday. PVR 238m.  Results for orders placed or performed in visit on 02/09/22  Bladder Scan (Post Void Residual) in office  Result Value Ref Range   Scan Result 2355m    Voiding trial equivocal.  We discussed various options including Foley catheter replacement with repeat voiding trial versus prolonged trial without catheter with [facility].  I also spoke with the patient's son, DaShanon Browvia telephone.  Ultimately, we elected for prolonged trial without catheter.  I provided the patient with a letter to take back to his facility recommending Foley catheter replacement as needed for recurrent retention..  Follow up: Return in about 1 week (around 02/16/2022) for Repeat PVR.

## 2022-02-09 NOTE — Telephone Encounter (Signed)
Called pt to see how he is doing after recent ER visit and to pass on a note from Dr Silvio Pate: If he is not already taking laxatives, he should probably take miralax daily and even 2 senna-s also.  No answer and VM was full.

## 2022-02-10 ENCOUNTER — Other Ambulatory Visit: Payer: Self-pay | Admitting: Nurse Practitioner

## 2022-02-10 DIAGNOSIS — M19122 Post-traumatic osteoarthritis, left elbow: Secondary | ICD-10-CM | POA: Diagnosis not present

## 2022-02-10 DIAGNOSIS — S42201D Unspecified fracture of upper end of right humerus, subsequent encounter for fracture with routine healing: Secondary | ICD-10-CM | POA: Diagnosis not present

## 2022-02-10 DIAGNOSIS — R262 Difficulty in walking, not elsewhere classified: Secondary | ICD-10-CM | POA: Diagnosis not present

## 2022-02-10 DIAGNOSIS — S42402D Unspecified fracture of lower end of left humerus, subsequent encounter for fracture with routine healing: Secondary | ICD-10-CM | POA: Diagnosis not present

## 2022-02-10 DIAGNOSIS — M6281 Muscle weakness (generalized): Secondary | ICD-10-CM | POA: Diagnosis not present

## 2022-02-10 DIAGNOSIS — I495 Sick sinus syndrome: Secondary | ICD-10-CM | POA: Diagnosis not present

## 2022-02-10 DIAGNOSIS — R2689 Other abnormalities of gait and mobility: Secondary | ICD-10-CM | POA: Diagnosis not present

## 2022-02-10 DIAGNOSIS — D696 Thrombocytopenia, unspecified: Secondary | ICD-10-CM | POA: Diagnosis not present

## 2022-02-10 DIAGNOSIS — M7022 Olecranon bursitis, left elbow: Secondary | ICD-10-CM | POA: Diagnosis not present

## 2022-02-10 DIAGNOSIS — Z950 Presence of cardiac pacemaker: Secondary | ICD-10-CM | POA: Diagnosis not present

## 2022-02-10 DIAGNOSIS — Z9181 History of falling: Secondary | ICD-10-CM | POA: Diagnosis not present

## 2022-02-10 DIAGNOSIS — E1140 Type 2 diabetes mellitus with diabetic neuropathy, unspecified: Secondary | ICD-10-CM | POA: Diagnosis not present

## 2022-02-10 DIAGNOSIS — R2681 Unsteadiness on feet: Secondary | ICD-10-CM | POA: Diagnosis not present

## 2022-02-10 DIAGNOSIS — I442 Atrioventricular block, complete: Secondary | ICD-10-CM | POA: Diagnosis not present

## 2022-02-10 DIAGNOSIS — R278 Other lack of coordination: Secondary | ICD-10-CM | POA: Diagnosis not present

## 2022-02-10 MED ORDER — TRAMADOL HCL 50 MG PO TABS: 50.0000 mg | ORAL_TABLET | Freq: Four times a day (QID) | ORAL | 0 refills | Status: DC | PRN

## 2022-02-11 ENCOUNTER — Non-Acute Institutional Stay (SKILLED_NURSING_FACILITY): Payer: Medicare Other | Admitting: Student

## 2022-02-11 ENCOUNTER — Telehealth: Payer: Self-pay | Admitting: Student

## 2022-02-11 ENCOUNTER — Encounter: Payer: Self-pay | Admitting: Student

## 2022-02-11 DIAGNOSIS — E1140 Type 2 diabetes mellitus with diabetic neuropathy, unspecified: Secondary | ICD-10-CM | POA: Diagnosis not present

## 2022-02-11 DIAGNOSIS — I495 Sick sinus syndrome: Secondary | ICD-10-CM | POA: Diagnosis not present

## 2022-02-11 DIAGNOSIS — M19122 Post-traumatic osteoarthritis, left elbow: Secondary | ICD-10-CM | POA: Diagnosis not present

## 2022-02-11 DIAGNOSIS — I4892 Unspecified atrial flutter: Secondary | ICD-10-CM | POA: Diagnosis not present

## 2022-02-11 DIAGNOSIS — R278 Other lack of coordination: Secondary | ICD-10-CM | POA: Diagnosis not present

## 2022-02-11 DIAGNOSIS — K5900 Constipation, unspecified: Secondary | ICD-10-CM | POA: Diagnosis not present

## 2022-02-11 DIAGNOSIS — N401 Enlarged prostate with lower urinary tract symptoms: Secondary | ICD-10-CM | POA: Diagnosis not present

## 2022-02-11 DIAGNOSIS — Z9181 History of falling: Secondary | ICD-10-CM | POA: Diagnosis not present

## 2022-02-11 DIAGNOSIS — M6281 Muscle weakness (generalized): Secondary | ICD-10-CM | POA: Diagnosis not present

## 2022-02-11 DIAGNOSIS — J4540 Moderate persistent asthma, uncomplicated: Secondary | ICD-10-CM | POA: Diagnosis not present

## 2022-02-11 DIAGNOSIS — I482 Chronic atrial fibrillation, unspecified: Secondary | ICD-10-CM

## 2022-02-11 DIAGNOSIS — S42209S Unspecified fracture of upper end of unspecified humerus, sequela: Secondary | ICD-10-CM

## 2022-02-11 DIAGNOSIS — R2681 Unsteadiness on feet: Secondary | ICD-10-CM | POA: Diagnosis not present

## 2022-02-11 DIAGNOSIS — R2689 Other abnormalities of gait and mobility: Secondary | ICD-10-CM | POA: Diagnosis not present

## 2022-02-11 DIAGNOSIS — R34 Anuria and oliguria: Secondary | ICD-10-CM

## 2022-02-11 DIAGNOSIS — R262 Difficulty in walking, not elsewhere classified: Secondary | ICD-10-CM | POA: Diagnosis not present

## 2022-02-11 DIAGNOSIS — I442 Atrioventricular block, complete: Secondary | ICD-10-CM | POA: Diagnosis not present

## 2022-02-11 DIAGNOSIS — I5032 Chronic diastolic (congestive) heart failure: Secondary | ICD-10-CM | POA: Diagnosis not present

## 2022-02-11 DIAGNOSIS — N138 Other obstructive and reflux uropathy: Secondary | ICD-10-CM | POA: Diagnosis not present

## 2022-02-11 DIAGNOSIS — D696 Thrombocytopenia, unspecified: Secondary | ICD-10-CM | POA: Diagnosis not present

## 2022-02-11 DIAGNOSIS — S42402D Unspecified fracture of lower end of left humerus, subsequent encounter for fracture with routine healing: Secondary | ICD-10-CM | POA: Diagnosis not present

## 2022-02-11 DIAGNOSIS — F39 Unspecified mood [affective] disorder: Secondary | ICD-10-CM | POA: Diagnosis not present

## 2022-02-11 DIAGNOSIS — S42201D Unspecified fracture of upper end of right humerus, subsequent encounter for fracture with routine healing: Secondary | ICD-10-CM | POA: Diagnosis not present

## 2022-02-11 DIAGNOSIS — Z950 Presence of cardiac pacemaker: Secondary | ICD-10-CM | POA: Diagnosis not present

## 2022-02-11 DIAGNOSIS — M7022 Olecranon bursitis, left elbow: Secondary | ICD-10-CM | POA: Diagnosis not present

## 2022-02-11 NOTE — Telephone Encounter (Signed)
Spoke with his son regarding concern for patient's recent change in mood. He has noticed decline in his mood as well. He has had poor PO intake. He isn't really enjoying life.   It's sad to have him decline in this way and wish there was a way to make him become more engaged and involved.   Will plan to discuss with patient as well today.

## 2022-02-11 NOTE — Progress Notes (Unsigned)
Location:  Other [place].  Nursing Home Room Number: [place]:  [id number] [phone]) Provider:  Dr. Dewayne Shorter  PCP: Venia Carbon, MD  Patient Care Team: Venia Carbon, MD as PCP - General (Internal Medicine) Rockey Situ Kathlene November, MD as PCP - Cardiology (Cardiology) Minna Merritts, MD as Consulting Physician (Cardiology)  Extended Emergency Contact Information Primary Emergency Contact: Dartagnan, Hughes [facility]) Mobile Phone: [phone] Relation: Son  Code Status:  DNR Goals of care: Advanced Directive information    02/11/2022    4:46 PM  Advanced Directives  Does Patient Have a Medical Advance Directive? Yes  Type of Paramedic of [place];Out of facility DNR (pink MOST or yellow form);Living will  Does patient want to make changes to medical advance directive? No - Patient declined  Copy of [place] in Chart? Yes - validated most recent copy scanned in chart (See row information)     Chief Complaint  Patient presents with   Acute Visit    Depressed Mood    HPI:  Pt is a 89 y.o. male seen today for an acute visit for    Past Medical History:  Diagnosis Date   Asthma    Atrial flutter ([place]) 2007   Band keratopathy    Benign prostatic hypertrophy    Chronic ear infection    Chronic kidney disease    Chronic prostatitis    Dental bridge present    permanent - upper   Diabetes mellitus    Elbow stiffness, left    s/p fracture many yrs ago.  arm does not straighten   GERD (gastroesophageal reflux disease)    laryngeal involvement   Hyperlipidemia    Hypertension    Obstructive sleep apnea    CPAP-9   Osteoarthrosis, localized, primary, knee    post-traumatic   Prostatitis    Stroke ([place])    TIA   Tachy-brady syndrome ([place])    UTI (urinary tract infection)    Past Surgical History:  Procedure Laterality Date   APPENDECTOMY     BELPHAROPTOSIS REPAIR     Dr [name]---didn't  resolve weepy eye and eyelid drooping   CARDIOVERSION  04/13/2010   CATARACT EXTRACTION, BILATERAL  2009   Chest pain  8/12   Stress test benign   ESOPHAGEAL DILATION  05/26/2016   Procedure: ESOPHAGEAL DILATION;  Surgeon: Lucilla Lame, MD;  Location: [place];  Service: Endoscopy;;   ESOPHAGOGASTRODUODENOSCOPY (EGD) WITH PROPOFOL N/A 05/26/2016   Procedure: ESOPHAGOGASTRODUODENOSCOPY (EGD) WITH PROPOFOL;  Surgeon: Lucilla Lame, MD;  Location: [place];  Service: Endoscopy;  Laterality: N/A;  Diabetic - oral meds sleep apnea   EYE SURGERY     FRACTURE SURGERY Left    elbow   KNEE SURGERY  1998   plate after fracture, then removed for infection   left elbow surgery     MASTOIDECTOMY  8/08   Dr Idelle Crouch   PACEMAKER IMPLANT N/A 09/02/2021   Procedure: PACEMAKER IMPLANT;  Surgeon: Vickie Epley, MD;  Location: [place] [facility];  Service: Cardiovascular;  Laterality: N/A;   RHINOPLASTY  5/10   and septoplasty   SUBACROMIAL DECOMPRESSION Right 2005   Arthroscopic (for rotator cuff and biceps tendon ruptures)   TEAR DUCT PROBING  11/13   Dr Vickki Muff   TOTAL KNEE ARTHROPLASTY Left 09/01/2014   Procedure: TOTAL KNEE ARTHROPLASTY;  Surgeon: Dereck Leep, MD;  Location: [facility];  Service: Orthopedics;  Laterality: Left;  TRIGGER FINGER RELEASE Left 02/25/2015   Procedure: LEFT LONG TRIGGER RELEASE;  Surgeon: Dereck Leep, MD;  Location: [facility];  Service: Orthopedics;  Laterality: Left;   VENOUS ABLATION      Allergies  Allergen Reactions   Lovenox [Enoxaparin] Hives   Proscar [Finasteride] Other (See Comments)    Upper body and arm weakness   Latex Rash    Has trouble with BANDAIDS that have been left on for more than 24 hours. Prefers paper tape.   Nickel Rash    Localized rash   Percocet [Oxycodone-Acetaminophen] Rash   Tape Rash and Other (See Comments)    Sensitivity     Outpatient Encounter Medications as of 02/11/2022  Medication Sig    acetaminophen (TYLENOL) 500 MG tablet Take 1,000 mg by mouth every 8 (eight) hours as needed.   apixaban (ELIQUIS) 2.5 MG TABS tablet Take 2.5 mg by mouth 2 (two) times daily.   APPLE CIDER VINEGAR PO Take 1 capsule by mouth at bedtime.   atorvastatin (LIPITOR) 80 MG tablet Take 1 tablet (80 mg total) by mouth daily.   cetirizine (ZYRTEC) 5 MG tablet Take 5 mg by mouth daily.   ciprofloxacin (CIPRO) 500 MG tablet Take 500 mg by mouth 2 (two) times daily.   Cranberry 250 MG TABS Take 1 tablet by mouth 2 (two) times daily.   Cyanocobalamin (B-12 PO) Take 1,000 mg by mouth daily.   docusate sodium (COLACE) 100 MG capsule Take 100 mg by mouth 2 (two) times daily.   finasteride (PROSCAR) 5 MG tablet Take 1 tablet (5 mg total) by mouth daily.   guaiFENesin (TUSSIN PO) Take 10 mLs by mouth every 4 (four) hours as needed.   lidocaine (LIDODERM) 5 % Place 1 patch onto the skin daily. Remove & Discard patch within 12 hours or as directed by MD   Magnesium Oxide 400 MG CAPS Take 1 capsule (400 mg total) by mouth daily.   metFORMIN (GLUCOPHAGE) 500 MG tablet TAKE 1 TABLET BY MOUTH  TWICE DAILY WITH MEALS   methocarbamol (ROBAXIN) 500 MG tablet Take 1 tablet (500 mg total) by mouth every 8 (eight) hours as needed for muscle spasms.   Polyethyl Glycol-Propyl Glycol (SYSTANE ULTRA OP) Place 1 drop into both eyes daily as needed (dry eyes).   polyethylene glycol (MIRALAX) 17 g packet Take 17 g by mouth 2 (two) times daily.   senna (SENOKOT) 8.6 MG TABS tablet Take 1 tablet (8.6 mg total) by mouth daily.   silver sulfADIAZINE (SILVADENE) 1 % cream Apply 1 Application topically daily.   sodium chloride 0.9 % SOLN by CRRT route as needed.  Use 1 liter intravenously every shift for fluid resuscitation 250cc/hr then decrease to 100cc/hr x one liter then d/c   tamsulosin (FLOMAX) 0.4 MG CAPS capsule Take 0.8 mg by mouth at bedtime.   traMADol (ULTRAM) 50 MG tablet Take 1 tablet (50 mg total) by mouth every 6 (six)  hours as needed for moderate pain.   [DISCONTINUED] triamterene-hydrochlorothiazide (DYAZIDE) 37.5-25 MG capsule TAKE 1 CAPSULE BY MOUTH IN  THE MORNING   No facility-administered encounter medications on file as of 02/11/2022.    Review of Systems  Immunization History  Administered Date(s) Administered   Fluad Quad(high Dose 65+) 09/14/2018   H1N1 12/13/2007   Influenza Split 09/11/2010   Influenza Whole 09/12/2011   Influenza, High Dose Seasonal PF 09/20/2012, 09/07/2017   Influenza, Seasonal, Injecte, Preservative Fre 09/08/2007, 09/26/2008, 10/02/2014, 09/18/2015   Influenza,inj,Quad PF,6+ Mos  09/19/2013, 09/21/2015   Influenza-Unspecified 09/07/2017, 09/11/2019, 09/03/2020, 10/19/2021   Moderna Sars-Covid-2 Vaccination 01/18/2019, 02/15/2019, 10/17/2020, 06/01/2021   Pneumococcal Conjugate-13 09/19/2013   Pneumococcal Polysaccharide-23 09/26/2003, 06/28/2016   Td 08/04/1998   Tdap 06/04/2010, 08/15/2021   Tetanus 08/04/1998   Unspecified SARS-COV-2 Vaccination 06/01/2021   Zoster, Live 01/16/2004   Pertinent  Health Maintenance Due  Topic Date Due   HEMOGLOBIN A1C  08/25/2021   OPHTHALMOLOGY EXAM  04/17/2022   FOOT EXAM  08/20/2022   INFLUENZA VACCINE  Completed      12/05/2021    8:00 AM 12/05/2021   10:38 PM 12/06/2021    7:05 AM 12/06/2021    7:40 PM 12/07/2021    8:45 AM  Fall Risk  (RETIRED) Patient Fall Risk Level High fall risk Moderate fall risk High fall risk High fall risk High fall risk   Functional Status Survey:    Vitals:   02/11/22 1634  BP: 132/78  Pulse: 75  Resp: 18  Temp: 98 F (36.7 C)  SpO2: 91%  Weight: 162 lb 8 oz (73.7 kg)  Height: 5' 10"$  (1.778 m)   Body mass index is 23.32 kg/m. Physical Exam  Labs reviewed: Recent Labs    09/02/21 0311 09/03/21 0457 09/16/21 0949 12/04/21 0118 12/04/21 0500 12/06/21 0348 02/08/22 1438  NA 138 137  --    < > 136 132* 134*  K 3.6 3.9  --    < > 3.6 3.7 3.9  CL 109 105  --    < > 105 103  96*  CO2 25 25  --    < > 25 22 24  $ GLUCOSE 125* 112*  --    < > 164* 166* 281*  BUN 26* 22  --    < > 25* 26* 50*  CREATININE 0.91 0.92  --    < > 0.79 0.79 1.20  CALCIUM 10.0 10.5*  --    < > 9.8 9.8 10.0  MG 1.5* 1.6* 1.6  --   --   --   --    < > = values in this interval not displayed.   Recent Labs    02/25/21 1507 02/08/22 1438  AST 15 42*  ALT 11 25  ALKPHOS 66 77  BILITOT 0.8 1.0  PROT 6.9 6.6  ALBUMIN 3.8 2.9*   Recent Labs    09/01/21 1139 12/04/21 0118 12/04/21 0500 12/06/21 0348 12/07/21 0623 02/08/22 1438  WBC 7.4 12.3*   < > 11.0* 10.6* 11.9*  NEUTROABS 5.0 10.2*  --   --   --   --   HGB 14.0 12.0*   < > 10.4* 10.7* 12.5*  HCT 41.7 34.8*   < > 30.8* 30.9* 39.0  MCV 94.3 92.3   < > 93.1 93.1 94.2  PLT 134* 174   < > 138* 139* 189   < > = values in this interval not displayed.   Lab Results  Component Value Date   TSH 0.76 12/11/2012   Lab Results  Component Value Date   HGBA1C 7.6 (H) 02/25/2021   Lab Results  Component Value Date   CHOL 131 02/25/2021   HDL 70.00 02/25/2021   LDLCALC 47 02/25/2021   TRIG 67.0 02/25/2021   CHOLHDL 2 02/25/2021    Significant Diagnostic Results in last 30 days:  CT ABDOMEN PELVIS W CONTRAST  Result Date: 02/08/2022 CLINICAL DATA:  Suspected bowel obstruction. Fecal impaction. Rectal bleeding. EXAM: CT ABDOMEN AND PELVIS WITH CONTRAST TECHNIQUE: Multidetector CT  imaging of the abdomen and pelvis was performed using the standard protocol following bolus administration of intravenous contrast. RADIATION DOSE REDUCTION: This exam was performed according to the departmental dose-optimization program which includes automated exposure control, adjustment of the mA and/or kV according to patient size and/or use of iterative reconstruction technique. CONTRAST:  127m OMNIPAQUE IOHEXOL 300 MG/ML  SOLN COMPARISON:  None Available. FINDINGS: Lower chest: Subpleural reticulation in the bilateral lung bases. Enlarged heart.  Calcific atherosclerotic disease of the coronary arteries and aorta. Hepatobiliary: Normal appearance of the liver. Cholelithiasis without evidence of acute cholecystitis. Pancreas: 1.7 cm hypoattenuated lesion in the body of the pancreas. Spleen: Normal in size without focal abnormality. Adrenals/Urinary Tract: Adrenal glands are unremarkable. Kidneys are normal, without renal calculi, focal lesion, or hydronephrosis. Bladder is decompressed around urinary Foley. Stomach/Bowel: No evidence of small-bowel obstruction. Large amount of formed stool in the left colon and rectum. The rectum is distended to 10 cm. No evidence of free gas in the abdomen. Vascular/Lymphatic: Aortic atherosclerosis. No enlarged abdominal or pelvic lymph nodes. Reproductive: Nonenlarged prostate gland. Other: No abdominal wall hernia or abnormality. No abdominopelvic ascites. Musculoskeletal: No acute osseous findings. Spondylosis of the spine. IMPRESSION: 1. Large amount of formed stool in the left colon and rectum. The rectum is distended to 10 cm. 2. No evidence of small-bowel obstruction. 3. Cholelithiasis without evidence of acute cholecystitis. 4. 1.7 cm hypoattenuated lesion in the body of the pancreas. Further evaluation with pancreatic protocol MRI may be considered on a nonemergent basis, if found clinically appropriate. 5. Enlarged heart with calcific atherosclerotic disease of the coronary arteries and aorta. 6. Subpleural reticulation in the bilateral lung bases, which may represent interstitial lung disease. 7. Aortic atherosclerosis. Aortic Atherosclerosis (ICD10-I70.0). Electronically Signed   By: DFidela SalisburyM.D.   On: 02/08/2022 17:46    Assessment/Plan There are no diagnoses linked to this encounter.   Family/ staff Communication: ***  Labs/tests ordered:  ***

## 2022-02-14 DIAGNOSIS — I442 Atrioventricular block, complete: Secondary | ICD-10-CM | POA: Diagnosis not present

## 2022-02-14 DIAGNOSIS — Z950 Presence of cardiac pacemaker: Secondary | ICD-10-CM | POA: Diagnosis not present

## 2022-02-14 DIAGNOSIS — Z9181 History of falling: Secondary | ICD-10-CM | POA: Diagnosis not present

## 2022-02-14 DIAGNOSIS — M6281 Muscle weakness (generalized): Secondary | ICD-10-CM | POA: Diagnosis not present

## 2022-02-14 DIAGNOSIS — I495 Sick sinus syndrome: Secondary | ICD-10-CM | POA: Diagnosis not present

## 2022-02-14 DIAGNOSIS — D696 Thrombocytopenia, unspecified: Secondary | ICD-10-CM | POA: Diagnosis not present

## 2022-02-14 DIAGNOSIS — S42402D Unspecified fracture of lower end of left humerus, subsequent encounter for fracture with routine healing: Secondary | ICD-10-CM | POA: Diagnosis not present

## 2022-02-14 DIAGNOSIS — E1140 Type 2 diabetes mellitus with diabetic neuropathy, unspecified: Secondary | ICD-10-CM | POA: Diagnosis not present

## 2022-02-14 DIAGNOSIS — S42201D Unspecified fracture of upper end of right humerus, subsequent encounter for fracture with routine healing: Secondary | ICD-10-CM | POA: Diagnosis not present

## 2022-02-14 DIAGNOSIS — M7022 Olecranon bursitis, left elbow: Secondary | ICD-10-CM | POA: Diagnosis not present

## 2022-02-14 DIAGNOSIS — I110 Hypertensive heart disease with heart failure: Secondary | ICD-10-CM | POA: Diagnosis not present

## 2022-02-14 DIAGNOSIS — R262 Difficulty in walking, not elsewhere classified: Secondary | ICD-10-CM | POA: Diagnosis not present

## 2022-02-14 DIAGNOSIS — R2689 Other abnormalities of gait and mobility: Secondary | ICD-10-CM | POA: Diagnosis not present

## 2022-02-14 DIAGNOSIS — M19122 Post-traumatic osteoarthritis, left elbow: Secondary | ICD-10-CM | POA: Diagnosis not present

## 2022-02-14 DIAGNOSIS — R278 Other lack of coordination: Secondary | ICD-10-CM | POA: Diagnosis not present

## 2022-02-14 DIAGNOSIS — R2681 Unsteadiness on feet: Secondary | ICD-10-CM | POA: Diagnosis not present

## 2022-02-14 LAB — BASIC METABOLIC PANEL
BUN: 37 — AB (ref 4–21)
CO2: 31 — AB (ref 13–22)
Chloride: 103 (ref 99–108)
Creatinine: 1 (ref 0.6–1.3)
Glucose: 162
Potassium: 3.5 mEq/L (ref 3.5–5.1)
Sodium: 138 (ref 137–147)

## 2022-02-14 LAB — CBC: RBC: 3.75 — AB (ref 3.87–5.11)

## 2022-02-14 LAB — COMPREHENSIVE METABOLIC PANEL
Calcium: 9.8 (ref 8.7–10.7)
eGFR: 72

## 2022-02-14 LAB — CBC AND DIFFERENTIAL
HCT: 34 — AB (ref 41–53)
Hemoglobin: 11.5 — AB (ref 13.5–17.5)
Neutrophils Absolute: 3440
Platelets: 110 10*3/uL — AB (ref 150–400)
WBC: 5.4

## 2022-02-15 ENCOUNTER — Ambulatory Visit (INDEPENDENT_AMBULATORY_CARE_PROVIDER_SITE_OTHER): Payer: Medicare Other | Admitting: Physician Assistant

## 2022-02-15 DIAGNOSIS — S42201D Unspecified fracture of upper end of right humerus, subsequent encounter for fracture with routine healing: Secondary | ICD-10-CM | POA: Diagnosis not present

## 2022-02-15 DIAGNOSIS — M7022 Olecranon bursitis, left elbow: Secondary | ICD-10-CM | POA: Diagnosis not present

## 2022-02-15 DIAGNOSIS — R2689 Other abnormalities of gait and mobility: Secondary | ICD-10-CM | POA: Diagnosis not present

## 2022-02-15 DIAGNOSIS — R278 Other lack of coordination: Secondary | ICD-10-CM | POA: Diagnosis not present

## 2022-02-15 DIAGNOSIS — R338 Other retention of urine: Secondary | ICD-10-CM

## 2022-02-15 DIAGNOSIS — R2681 Unsteadiness on feet: Secondary | ICD-10-CM | POA: Diagnosis not present

## 2022-02-15 DIAGNOSIS — I495 Sick sinus syndrome: Secondary | ICD-10-CM | POA: Diagnosis not present

## 2022-02-15 DIAGNOSIS — Z9181 History of falling: Secondary | ICD-10-CM | POA: Diagnosis not present

## 2022-02-15 DIAGNOSIS — Z950 Presence of cardiac pacemaker: Secondary | ICD-10-CM | POA: Diagnosis not present

## 2022-02-15 DIAGNOSIS — M19122 Post-traumatic osteoarthritis, left elbow: Secondary | ICD-10-CM | POA: Diagnosis not present

## 2022-02-15 DIAGNOSIS — I442 Atrioventricular block, complete: Secondary | ICD-10-CM | POA: Diagnosis not present

## 2022-02-15 DIAGNOSIS — M6281 Muscle weakness (generalized): Secondary | ICD-10-CM | POA: Diagnosis not present

## 2022-02-15 DIAGNOSIS — D696 Thrombocytopenia, unspecified: Secondary | ICD-10-CM | POA: Diagnosis not present

## 2022-02-15 DIAGNOSIS — E1140 Type 2 diabetes mellitus with diabetic neuropathy, unspecified: Secondary | ICD-10-CM | POA: Diagnosis not present

## 2022-02-15 DIAGNOSIS — R262 Difficulty in walking, not elsewhere classified: Secondary | ICD-10-CM | POA: Diagnosis not present

## 2022-02-15 DIAGNOSIS — S42402D Unspecified fracture of lower end of left humerus, subsequent encounter for fracture with routine healing: Secondary | ICD-10-CM | POA: Diagnosis not present

## 2022-02-15 NOTE — Progress Notes (Signed)
02/15/2022 4:39 PM   Michael Colon 10/28/1930 [id number]  CC: Chief Complaint  Patient presents with   Follow-up   HPI: Michael Colon is a 89 y.o. male with PMH BPH on maximal medical therapy with Flomax and finasteride, epididymitis/prostatitis, and a recent history of urinary retention with an equivocal voiding trial with me last week who presents today for 1 week follow-up with repeat PVR.  On arrival today, his Foley catheter has been replaced.  Per chart review, Foley catheter was replaced due to oliguria but urine output at the time of replacement was not documented.  He reports displeasure today that his catheter was replaced.  The catheter in place draining clear, yellow urine.  PMH: Past Medical History:  Diagnosis Date   Asthma    Atrial flutter ([place]) 2007   Band keratopathy    Benign prostatic hypertrophy    Chronic ear infection    Chronic kidney disease    Chronic prostatitis    Dental bridge present    permanent - upper   Diabetes mellitus    Elbow stiffness, left    s/p fracture many yrs ago.  arm does not straighten   GERD (gastroesophageal reflux disease)    laryngeal involvement   Hyperlipidemia    Hypertension    Obstructive sleep apnea    CPAP-9   Osteoarthrosis, localized, primary, knee    post-traumatic   Prostatitis    Stroke ([place])    TIA   Tachy-brady syndrome ([place])    UTI (urinary tract infection)     Surgical History: Past Surgical History:  Procedure Laterality Date   APPENDECTOMY     BELPHAROPTOSIS REPAIR     Dr [name]---didn't resolve weepy eye and eyelid drooping   CARDIOVERSION  04/13/2010   CATARACT EXTRACTION, BILATERAL  2009   Chest pain  8/12   Stress test benign   ESOPHAGEAL DILATION  05/26/2016   Procedure: ESOPHAGEAL DILATION;  Surgeon: Lucilla Lame, MD;  Location: [place];  Service: Endoscopy;;   ESOPHAGOGASTRODUODENOSCOPY (EGD) WITH PROPOFOL N/A 05/26/2016   Procedure: ESOPHAGOGASTRODUODENOSCOPY (EGD)  WITH PROPOFOL;  Surgeon: Lucilla Lame, MD;  Location: [place];  Service: Endoscopy;  Laterality: N/A;  Diabetic - oral meds sleep apnea   EYE SURGERY     FRACTURE SURGERY Left    elbow   KNEE SURGERY  1998   plate after fracture, then removed for infection   left elbow surgery     MASTOIDECTOMY  8/08   Dr Idelle Crouch   PACEMAKER IMPLANT N/A 09/02/2021   Procedure: PACEMAKER IMPLANT;  Surgeon: Vickie Epley, MD;  Location: [place] [facility];  Service: Cardiovascular;  Laterality: N/A;   RHINOPLASTY  5/10   and septoplasty   SUBACROMIAL DECOMPRESSION Right 2005   Arthroscopic (for rotator cuff and biceps tendon ruptures)   TEAR DUCT PROBING  11/13   Dr Vickki Muff   TOTAL KNEE ARTHROPLASTY Left 09/01/2014   Procedure: TOTAL KNEE ARTHROPLASTY;  Surgeon: Dereck Leep, MD;  Location: [facility];  Service: Orthopedics;  Laterality: Left;   TRIGGER FINGER RELEASE Left 02/25/2015   Procedure: LEFT LONG TRIGGER RELEASE;  Surgeon: Dereck Leep, MD;  Location: [facility];  Service: Orthopedics;  Laterality: Left;   VENOUS ABLATION      Home Medications:  Allergies as of 02/15/2022       Reactions   Lovenox [enoxaparin] Hives   Proscar [finasteride] Other (See Comments)   Upper body and arm weakness   Latex Rash  Has trouble with BANDAIDS that have been left on for more than 24 hours. Prefers paper tape.   Nickel Rash   Localized rash   Percocet [oxycodone-acetaminophen] Rash   Tape Rash, Other (See Comments)   Sensitivity         Medication List        Accurate as of February 15, 2022  4:39 PM. If you have any questions, ask your nurse or doctor.          acetaminophen 500 MG tablet Commonly known as: TYLENOL Take 1,000 mg by mouth every 8 (eight) hours as needed.   APPLE CIDER VINEGAR PO Take 1 capsule by mouth at bedtime.   atorvastatin 80 MG tablet Commonly known as: LIPITOR Take 1 tablet (80 mg total) by mouth daily.   B-12 PO Take 1,000 mg by mouth  daily.   cetirizine 5 MG tablet Commonly known as: ZYRTEC Take 5 mg by mouth daily.   ciprofloxacin 500 MG tablet Commonly known as: CIPRO Take 500 mg by mouth 2 (two) times daily.   Cranberry 250 MG Tabs Take 1 tablet by mouth 2 (two) times daily.   docusate sodium 100 MG capsule Commonly known as: COLACE Take 100 mg by mouth 2 (two) times daily.   Eliquis 2.5 MG Tabs tablet Generic drug: apixaban Take 2.5 mg by mouth 2 (two) times daily.   finasteride 5 MG tablet Commonly known as: PROSCAR Take 1 tablet (5 mg total) by mouth daily.   lidocaine 5 % Commonly known as: LIDODERM Place 1 patch onto the skin daily. Remove & Discard patch within 12 hours or as directed by MD   Magnesium Oxide 400 MG Caps Take 1 capsule (400 mg total) by mouth daily.   metFORMIN 500 MG tablet Commonly known as: GLUCOPHAGE TAKE 1 TABLET BY MOUTH  TWICE DAILY WITH MEALS   methocarbamol 500 MG tablet Commonly known as: ROBAXIN Take 1 tablet (500 mg total) by mouth every 8 (eight) hours as needed for muscle spasms.   polyethylene glycol 17 g packet Commonly known as: MiraLax Take 17 g by mouth 2 (two) times daily.   senna 8.6 MG Tabs tablet Commonly known as: SENOKOT Take 1 tablet (8.6 mg total) by mouth daily.   silver sulfADIAZINE 1 % cream Commonly known as: SILVADENE Apply 1 Application topically daily.   sodium chloride 0.9 % Soln by CRRT route as needed.  Use 1 liter intravenously every shift for fluid resuscitation 250cc/hr then decrease to 100cc/hr x one liter then d/c   SYSTANE ULTRA OP Place 1 drop into both eyes daily as needed (dry eyes).   tamsulosin 0.4 MG Caps capsule Commonly known as: FLOMAX Take 0.8 mg by mouth at bedtime.   traMADol 50 MG tablet Commonly known as: ULTRAM Take 1 tablet (50 mg total) by mouth every 6 (six) hours as needed for moderate pain.   TUSSIN PO Take 10 mLs by mouth every 4 (four) hours as needed.        Allergies:  Allergies   Allergen Reactions   Lovenox [Enoxaparin] Hives   Proscar [Finasteride] Other (See Comments)    Upper body and arm weakness   Latex Rash    Has trouble with BANDAIDS that have been left on for more than 24 hours. Prefers paper tape.   Nickel Rash    Localized rash   Percocet [Oxycodone-Acetaminophen] Rash   Tape Rash and Other (See Comments)    Sensitivity     Family History:  Family History  Problem Relation Age of Onset   Colon cancer Father    Diabetes Father    Hypertension Father    Other Mother        natural causes   Heart attack Neg Hx    Stroke Neg Hx     Social History:   reports that he quit smoking about 53 years ago. His smoking use included cigarettes. He smoked an average of 1.00 packs per day. He has never been exposed to tobacco smoke. He has never used smokeless tobacco. He reports that he does not drink alcohol and does not use drugs.  Physical Exam: There were no vitals taken for this visit.  Constitutional:  Alert, no acute distress, nontoxic appearing HEENT: [place], AT Cardiovascular: No clubbing, cyanosis, or edema Respiratory: Normal respiratory effort, no increased work of breathing Skin: No rashes, bruises or suspicious lesions Neurologic: Grossly intact, no focal deficits, moving all 4 extremities Psychiatric: Normal mood and affect  Assessment & Plan:   1. Acute urinary retention Foley catheter replaced since last clinic visit.  We discussed that he is already on maximum medical therapy for his BPH.  At this point, I recommended pursuing outlet workup with a cystoscopy.  He was rather argumentative about this and stated he has no information about cystoscopy despite me having explained them to him just prior; he subsequently stated he has [place].  Ultimately, he agreed with scheduling.  Return in about 2 weeks (around 03/01/2022) for Cysto with Dr. Diamantina Providence.  Debroah Loop, PA-C  [facility]  [facility] [address], [place], [place] [address] [phone]

## 2022-02-16 DIAGNOSIS — M6281 Muscle weakness (generalized): Secondary | ICD-10-CM | POA: Diagnosis not present

## 2022-02-16 DIAGNOSIS — M19122 Post-traumatic osteoarthritis, left elbow: Secondary | ICD-10-CM | POA: Diagnosis not present

## 2022-02-16 DIAGNOSIS — M7022 Olecranon bursitis, left elbow: Secondary | ICD-10-CM | POA: Diagnosis not present

## 2022-02-16 DIAGNOSIS — R262 Difficulty in walking, not elsewhere classified: Secondary | ICD-10-CM | POA: Diagnosis not present

## 2022-02-16 DIAGNOSIS — S42402D Unspecified fracture of lower end of left humerus, subsequent encounter for fracture with routine healing: Secondary | ICD-10-CM | POA: Diagnosis not present

## 2022-02-16 DIAGNOSIS — I442 Atrioventricular block, complete: Secondary | ICD-10-CM | POA: Diagnosis not present

## 2022-02-16 DIAGNOSIS — Z9181 History of falling: Secondary | ICD-10-CM | POA: Diagnosis not present

## 2022-02-16 DIAGNOSIS — R2689 Other abnormalities of gait and mobility: Secondary | ICD-10-CM | POA: Diagnosis not present

## 2022-02-16 DIAGNOSIS — D696 Thrombocytopenia, unspecified: Secondary | ICD-10-CM | POA: Diagnosis not present

## 2022-02-16 DIAGNOSIS — R278 Other lack of coordination: Secondary | ICD-10-CM | POA: Diagnosis not present

## 2022-02-16 DIAGNOSIS — E1140 Type 2 diabetes mellitus with diabetic neuropathy, unspecified: Secondary | ICD-10-CM | POA: Diagnosis not present

## 2022-02-16 DIAGNOSIS — I495 Sick sinus syndrome: Secondary | ICD-10-CM | POA: Diagnosis not present

## 2022-02-16 DIAGNOSIS — R2681 Unsteadiness on feet: Secondary | ICD-10-CM | POA: Diagnosis not present

## 2022-02-16 DIAGNOSIS — S42201D Unspecified fracture of upper end of right humerus, subsequent encounter for fracture with routine healing: Secondary | ICD-10-CM | POA: Diagnosis not present

## 2022-02-16 DIAGNOSIS — Z950 Presence of cardiac pacemaker: Secondary | ICD-10-CM | POA: Diagnosis not present

## 2022-02-16 NOTE — Telephone Encounter (Addendum)
Tried to call pt, again. VM is still full. Sent a Therapist, music.

## 2022-02-17 ENCOUNTER — Encounter: Payer: Self-pay | Admitting: Nurse Practitioner

## 2022-02-17 ENCOUNTER — Non-Acute Institutional Stay (SKILLED_NURSING_FACILITY): Payer: Medicare Other | Admitting: Nurse Practitioner

## 2022-02-17 DIAGNOSIS — M7022 Olecranon bursitis, left elbow: Secondary | ICD-10-CM | POA: Diagnosis not present

## 2022-02-17 DIAGNOSIS — S42201D Unspecified fracture of upper end of right humerus, subsequent encounter for fracture with routine healing: Secondary | ICD-10-CM | POA: Diagnosis not present

## 2022-02-17 DIAGNOSIS — E1140 Type 2 diabetes mellitus with diabetic neuropathy, unspecified: Secondary | ICD-10-CM | POA: Diagnosis not present

## 2022-02-17 DIAGNOSIS — E440 Moderate protein-calorie malnutrition: Secondary | ICD-10-CM

## 2022-02-17 DIAGNOSIS — M19122 Post-traumatic osteoarthritis, left elbow: Secondary | ICD-10-CM | POA: Diagnosis not present

## 2022-02-17 DIAGNOSIS — D696 Thrombocytopenia, unspecified: Secondary | ICD-10-CM | POA: Diagnosis not present

## 2022-02-17 DIAGNOSIS — Z9181 History of falling: Secondary | ICD-10-CM | POA: Diagnosis not present

## 2022-02-17 DIAGNOSIS — R2681 Unsteadiness on feet: Secondary | ICD-10-CM | POA: Diagnosis not present

## 2022-02-17 DIAGNOSIS — F39 Unspecified mood [affective] disorder: Secondary | ICD-10-CM | POA: Diagnosis not present

## 2022-02-17 DIAGNOSIS — I442 Atrioventricular block, complete: Secondary | ICD-10-CM | POA: Diagnosis not present

## 2022-02-17 DIAGNOSIS — M6281 Muscle weakness (generalized): Secondary | ICD-10-CM | POA: Diagnosis not present

## 2022-02-17 DIAGNOSIS — I495 Sick sinus syndrome: Secondary | ICD-10-CM | POA: Diagnosis not present

## 2022-02-17 DIAGNOSIS — R2689 Other abnormalities of gait and mobility: Secondary | ICD-10-CM | POA: Diagnosis not present

## 2022-02-17 DIAGNOSIS — S42402D Unspecified fracture of lower end of left humerus, subsequent encounter for fracture with routine healing: Secondary | ICD-10-CM | POA: Diagnosis not present

## 2022-02-17 DIAGNOSIS — Z950 Presence of cardiac pacemaker: Secondary | ICD-10-CM | POA: Diagnosis not present

## 2022-02-17 DIAGNOSIS — R278 Other lack of coordination: Secondary | ICD-10-CM | POA: Diagnosis not present

## 2022-02-17 DIAGNOSIS — R262 Difficulty in walking, not elsewhere classified: Secondary | ICD-10-CM | POA: Diagnosis not present

## 2022-02-17 NOTE — Progress Notes (Signed)
Location:   [facility] Service:  SNF (31) Provider:  Sherrie Mustache, NP  Venia Carbon, MD  Patient Care Team: Venia Carbon, MD as PCP - General (Internal Medicine) Minna Merritts, MD as PCP - Cardiology (Cardiology) Minna Merritts, MD as Consulting Physician (Cardiology)  Extended Emergency Contact Information Primary Emergency Contact: Ev[place], [facility]) Mobile Phone: [phone] Relation: Son  Code Status:  DNR Goals of care: Advanced Directive information    02/17/2022    1:45 PM  Advanced Directives  Does Patient Have a Medical Advance Directive? Yes  Type of Paramedic of [place];Living will;Out of facility DNR (pink MOST or yellow form)  Does patient want to make changes to medical advance directive? No - Patient declined  Copy of [place] in Chart? Yes - validated most recent copy scanned in chart (See row information)  Pre-existing out of facility DNR order (yellow form or pink MOST form) Yellow form placed in chart (order not valid for inpatient use)     Chief Complaint  Patient presents with   Acute Visit    HPI:  Pt is a 89 y.o. male seen today for an acute visit at the request of nursing for appetite stimulate.  Pt had reported to staff that he had trouble swallowing however today states he has no issues swallowing. He reports he feels like he is eating enough. He has lost over 20 lbs since he has been in SNF. Staff helps assist with feedings as he needs but he continues to eat minimally. Reports he feels like his appetite is fine. Denies pain in his mouth, trouble chewing or swallowing. Denies trouble eating or feeding himself .   Past Medical History:  Diagnosis Date   Asthma    Atrial flutter ([place]) 2007   Band keratopathy    Benign prostatic hypertrophy    Chronic ear infection    Chronic kidney disease    Chronic prostatitis    Dental bridge present     permanent - upper   Diabetes mellitus    Elbow stiffness, left    s/p fracture many yrs ago.  arm does not straighten   GERD (gastroesophageal reflux disease)    laryngeal involvement   Hyperlipidemia    Hypertension    Obstructive sleep apnea    CPAP-9   Osteoarthrosis, localized, primary, knee    post-traumatic   Prostatitis    Stroke ([place])    TIA   Tachy-brady syndrome ([place])    UTI (urinary tract infection)    Past Surgical History:  Procedure Laterality Date   APPENDECTOMY     BELPHAROPTOSIS REPAIR     Dr [name]---didn't resolve weepy eye and eyelid drooping   CARDIOVERSION  04/13/2010   CATARACT EXTRACTION, BILATERAL  2009   Chest pain  8/12   Stress test benign   ESOPHAGEAL DILATION  05/26/2016   Procedure: ESOPHAGEAL DILATION;  Surgeon: Lucilla Lame, MD;  Location: [place];  Service: Endoscopy;;   ESOPHAGOGASTRODUODENOSCOPY (EGD) WITH PROPOFOL N/A 05/26/2016   Procedure: ESOPHAGOGASTRODUODENOSCOPY (EGD) WITH PROPOFOL;  Surgeon: Lucilla Lame, MD;  Location: [place];  Service: Endoscopy;  Laterality: N/A;  Diabetic - oral meds sleep apnea   EYE SURGERY     FRACTURE SURGERY Left    elbow   KNEE SURGERY  1998   plate after fracture, then removed for infection   left elbow surgery     MASTOIDECTOMY  8/08  Dr Idelle Crouch   PACEMAKER IMPLANT N/A 09/02/2021   Procedure: PACEMAKER IMPLANT;  Surgeon: Vickie Epley, MD;  Location: [place] [facility];  Service: Cardiovascular;  Laterality: N/A;   RHINOPLASTY  5/10   and septoplasty   SUBACROMIAL DECOMPRESSION Right 2005   Arthroscopic (for rotator cuff and biceps tendon ruptures)   TEAR DUCT PROBING  11/13   Dr Vickki Muff   TOTAL KNEE ARTHROPLASTY Left 09/01/2014   Procedure: TOTAL KNEE ARTHROPLASTY;  Surgeon: Dereck Leep, MD;  Location: [facility];  Service: Orthopedics;  Laterality: Left;   TRIGGER FINGER RELEASE Left 02/25/2015   Procedure: LEFT LONG TRIGGER RELEASE;  Surgeon: Dereck Leep, MD;   Location: [facility];  Service: Orthopedics;  Laterality: Left;   VENOUS ABLATION      Allergies  Allergen Reactions   Lovenox [Enoxaparin] Hives   Proscar [Finasteride] Other (See Comments)    Upper body and arm weakness   Latex Rash    Has trouble with BANDAIDS that have been left on for more than 24 hours. Prefers paper tape.   Nickel Rash    Localized rash   Percocet [Oxycodone-Acetaminophen] Rash   Tape Rash and Other (See Comments)    Sensitivity     Allergies as of 02/17/2022       Reactions   Lovenox [enoxaparin] Hives   Proscar [finasteride] Other (See Comments)   Upper body and arm weakness   Latex Rash   Has trouble with BANDAIDS that have been left on for more than 24 hours. Prefers paper tape.   Nickel Rash   Localized rash   Percocet [oxycodone-acetaminophen] Rash   Tape Rash, Other (See Comments)   Sensitivity         Medication List        Accurate as of February 17, 2022  2:10 PM. If you have any questions, ask your nurse or doctor.          STOP taking these medications    ciprofloxacin 500 MG tablet Commonly known as: CIPRO Stopped by: Lauree Chandler, NP       TAKE these medications    acetaminophen 500 MG tablet Commonly known as: TYLENOL Take 1,000 mg by mouth every 8 (eight) hours as needed.   APPLE CIDER VINEGAR PO Take 1 capsule by mouth at bedtime.   atorvastatin 80 MG tablet Commonly known as: LIPITOR Take 1 tablet (80 mg total) by mouth daily.   B-12 PO Take 1,000 mg by mouth daily.   cetirizine 5 MG tablet Commonly known as: ZYRTEC Take 5 mg by mouth daily.   Cranberry 250 MG Tabs Take 1 tablet by mouth 2 (two) times daily.   docusate sodium 100 MG capsule Commonly known as: COLACE Take 100 mg by mouth 2 (two) times daily.   Eliquis 2.5 MG Tabs tablet Generic drug: apixaban Take 2.5 mg by mouth 2 (two) times daily.   finasteride 5 MG tablet Commonly known as: PROSCAR Take 1 tablet (5 mg total) by mouth  daily.   lidocaine 5 % Commonly known as: LIDODERM Place 1 patch onto the skin daily. Remove & Discard patch within 12 hours or as directed by MD   Magnesium Oxide 400 MG Caps Take 1 capsule (400 mg total) by mouth daily.   metFORMIN 500 MG tablet Commonly known as: GLUCOPHAGE TAKE 1 TABLET BY MOUTH  TWICE DAILY WITH MEALS   methocarbamol 500 MG tablet Commonly known as: ROBAXIN Take 1 tablet (500 mg total) by  mouth every 8 (eight) hours as needed for muscle spasms.   polyethylene glycol 17 g packet Commonly known as: MiraLax Take 17 g by mouth 2 (two) times daily.   senna 8.6 MG Tabs tablet Commonly known as: SENOKOT Take 1 tablet (8.6 mg total) by mouth daily.   sertraline 50 MG tablet Commonly known as: ZOLOFT Take 50 mg by mouth daily.   silver sulfADIAZINE 1 % cream Commonly known as: SILVADENE Apply 1 Application topically daily.   sodium chloride 0.9 % Soln by CRRT route as needed.  Use 1 liter intravenously every shift for fluid resuscitation 250cc/hr then decrease to 100cc/hr x one liter then d/c   SYSTANE ULTRA OP Place 1 drop into both eyes daily as needed (dry eyes).   tamsulosin 0.4 MG Caps capsule Commonly known as: FLOMAX Take 0.8 mg by mouth at bedtime.   traMADol 50 MG tablet Commonly known as: ULTRAM Take 1 tablet (50 mg total) by mouth every 6 (six) hours as needed for moderate pain.   TUSSIN PO Take 10 mLs by mouth every 4 (four) hours as needed.        Review of Systems  Constitutional:  Positive for appetite change, fatigue and unexpected weight change. Negative for activity change.  Gastrointestinal:  Negative for abdominal distention, abdominal pain, constipation and diarrhea.    Immunization History  Administered Date(s) Administered   Fluad Quad(high Dose 65+) 09/14/2018   H1N1 12/13/2007   Influenza Split 09/11/2010   Influenza Whole 09/12/2011   Influenza, High Dose Seasonal PF 09/20/2012, 09/07/2017   Influenza, Seasonal,  Injecte, Preservative Fre 09/08/2007, 09/26/2008, 10/02/2014, 09/18/2015   Influenza,inj,Quad PF,6+ Mos 09/19/2013, 09/21/2015   Influenza-Unspecified 09/07/2017, 09/11/2019, 09/03/2020, 10/19/2021   Moderna Sars-Covid-2 Vaccination 01/18/2019, 02/15/2019, 10/17/2020, 06/01/2021   Pneumococcal Conjugate-13 09/19/2013   Pneumococcal Polysaccharide-23 09/26/2003, 06/28/2016   Td 08/04/1998   Tdap 06/04/2010, 08/15/2021   Tetanus 08/04/1998   Unspecified SARS-COV-2 Vaccination 06/01/2021   Zoster, Live 01/16/2004   Pertinent  Health Maintenance Due  Topic Date Due   HEMOGLOBIN A1C  08/25/2021   OPHTHALMOLOGY EXAM  04/17/2022   FOOT EXAM  08/20/2022   INFLUENZA VACCINE  Completed      12/05/2021    8:00 AM 12/05/2021   10:38 PM 12/06/2021    7:05 AM 12/06/2021    7:40 PM 12/07/2021    8:45 AM  Fall Risk  (RETIRED) Patient Fall Risk Level High fall risk Moderate fall risk High fall risk High fall risk High fall risk   Functional Status Survey:    Vitals:   02/17/22 1340  BP: 113/78  Pulse: 95  Resp: 18  Temp: 98 F (36.7 C)  SpO2: 96%  Weight: 162 lb 8 oz (73.7 kg)  Height: 5' 10"$  (1.778 m)   Body mass index is 23.32 kg/m.  Wt Readings from Last 3 Encounters:  02/17/22 162 lb 8 oz (73.7 kg)  02/11/22 162 lb 8 oz (73.7 kg)  02/09/22 186 lb (84.4 kg)    Physical Exam Constitutional:      General: He is not in acute distress.    Appearance: He is well-developed. He is not diaphoretic.  HENT:     Head: Normocephalic and atraumatic.     Right Ear: External ear normal.     Left Ear: External ear normal.     Mouth/Throat:     Pharynx: No oropharyngeal exudate.  Eyes:     Conjunctiva/sclera: Conjunctivae normal.     Pupils: Pupils are equal, round,  and reactive to light.  Cardiovascular:     Rate and Rhythm: Normal rate and regular rhythm.     Heart sounds: Normal heart sounds.  Pulmonary:     Effort: Pulmonary effort is normal.     Breath sounds: Normal breath  sounds.  Abdominal:     General: Bowel sounds are normal.     Palpations: Abdomen is soft.  Musculoskeletal:        General: No tenderness.     Cervical back: Normal range of motion and neck supple.     Right lower leg: No edema.     Left lower leg: No edema.  Skin:    General: Skin is warm and dry.  Neurological:     Mental Status: He is alert and oriented to person, place, and time.     Labs reviewed: Recent Labs    09/02/21 0311 09/03/21 0457 09/16/21 0949 12/04/21 0118 12/04/21 0500 12/06/21 0348 02/08/22 1438  NA 138 137  --    < > 136 132* 134*  K 3.6 3.9  --    < > 3.6 3.7 3.9  CL 109 105  --    < > 105 103 96*  CO2 25 25  --    < > 25 22 24  $ GLUCOSE 125* 112*  --    < > 164* 166* 281*  BUN 26* 22  --    < > 25* 26* 50*  CREATININE 0.91 0.92  --    < > 0.79 0.79 1.20  CALCIUM 10.0 10.5*  --    < > 9.8 9.8 10.0  MG 1.5* 1.6* 1.6  --   --   --   --    < > = values in this interval not displayed.   Recent Labs    02/25/21 1507 02/08/22 1438  AST 15 42*  ALT 11 25  ALKPHOS 66 77  BILITOT 0.8 1.0  PROT 6.9 6.6  ALBUMIN 3.8 2.9*   Recent Labs    09/01/21 1139 12/04/21 0118 12/04/21 0500 12/06/21 0348 12/07/21 0623 02/08/22 1438  WBC 7.4 12.3*   < > 11.0* 10.6* 11.9*  NEUTROABS 5.0 10.2*  --   --   --   --   HGB 14.0 12.0*   < > 10.4* 10.7* 12.5*  HCT 41.7 34.8*   < > 30.8* 30.9* 39.0  MCV 94.3 92.3   < > 93.1 93.1 94.2  PLT 134* 174   < > 138* 139* 189   < > = values in this interval not displayed.   Lab Results  Component Value Date   TSH 0.76 12/11/2012   Lab Results  Component Value Date   HGBA1C 7.6 (H) 02/25/2021   Lab Results  Component Value Date   CHOL 131 02/25/2021   HDL 70.00 02/25/2021   LDLCALC 47 02/25/2021   TRIG 67.0 02/25/2021   CHOLHDL 2 02/25/2021    Significant Diagnostic Results in last 30 days:  CT ABDOMEN PELVIS W CONTRAST  Result Date: 02/08/2022 CLINICAL DATA:  Suspected bowel obstruction. Fecal impaction.  Rectal bleeding. EXAM: CT ABDOMEN AND PELVIS WITH CONTRAST TECHNIQUE: Multidetector CT imaging of the abdomen and pelvis was performed using the standard protocol following bolus administration of intravenous contrast. RADIATION DOSE REDUCTION: This exam was performed according to the departmental dose-optimization program which includes automated exposure control, adjustment of the mA and/or kV according to patient size and/or use of iterative reconstruction technique. CONTRAST:  181m OMNIPAQUE IOHEXOL 300 MG/ML  SOLN COMPARISON:  None Available. FINDINGS: Lower chest: Subpleural reticulation in the bilateral lung bases. Enlarged heart. Calcific atherosclerotic disease of the coronary arteries and aorta. Hepatobiliary: Normal appearance of the liver. Cholelithiasis without evidence of acute cholecystitis. Pancreas: 1.7 cm hypoattenuated lesion in the body of the pancreas. Spleen: Normal in size without focal abnormality. Adrenals/Urinary Tract: Adrenal glands are unremarkable. Kidneys are normal, without renal calculi, focal lesion, or hydronephrosis. Bladder is decompressed around urinary Foley. Stomach/Bowel: No evidence of small-bowel obstruction. Large amount of formed stool in the left colon and rectum. The rectum is distended to 10 cm. No evidence of free gas in the abdomen. Vascular/Lymphatic: Aortic atherosclerosis. No enlarged abdominal or pelvic lymph nodes. Reproductive: Nonenlarged prostate gland. Other: No abdominal wall hernia or abnormality. No abdominopelvic ascites. Musculoskeletal: No acute osseous findings. Spondylosis of the spine. IMPRESSION: 1. Large amount of formed stool in the left colon and rectum. The rectum is distended to 10 cm. 2. No evidence of small-bowel obstruction. 3. Cholelithiasis without evidence of acute cholecystitis. 4. 1.7 cm hypoattenuated lesion in the body of the pancreas. Further evaluation with pancreatic protocol MRI may be considered on a nonemergent basis, if found  clinically appropriate. 5. Enlarged heart with calcific atherosclerotic disease of the coronary arteries and aorta. 6. Subpleural reticulation in the bilateral lung bases, which may represent interstitial lung disease. 7. Aortic atherosclerosis. Aortic Atherosclerosis (ICD10-I70.0). Electronically Signed   By: Fidela Salisbury M.D.   On: 02/08/2022 17:46    Assessment/Plan 1. Moderate protein-calorie malnutrition ([place]) -over 20 lb weight loss since admission, he has been followed by nursing and RD.  -on supplements at this time.  -he has poor appetite and declines appetite stimulant at this time -will continue with supportive care  2. Mood disorder ([place]) -states his mood has been stable, no anxiety or depression at this time.   Michael Colon. [place], [facility] [phone]

## 2022-02-18 ENCOUNTER — Telehealth: Payer: Self-pay

## 2022-02-18 DIAGNOSIS — D696 Thrombocytopenia, unspecified: Secondary | ICD-10-CM | POA: Diagnosis not present

## 2022-02-18 DIAGNOSIS — M6281 Muscle weakness (generalized): Secondary | ICD-10-CM | POA: Diagnosis not present

## 2022-02-18 DIAGNOSIS — R2681 Unsteadiness on feet: Secondary | ICD-10-CM | POA: Diagnosis not present

## 2022-02-18 DIAGNOSIS — M19122 Post-traumatic osteoarthritis, left elbow: Secondary | ICD-10-CM | POA: Diagnosis not present

## 2022-02-18 DIAGNOSIS — Z950 Presence of cardiac pacemaker: Secondary | ICD-10-CM | POA: Diagnosis not present

## 2022-02-18 DIAGNOSIS — E1140 Type 2 diabetes mellitus with diabetic neuropathy, unspecified: Secondary | ICD-10-CM | POA: Diagnosis not present

## 2022-02-18 DIAGNOSIS — R2689 Other abnormalities of gait and mobility: Secondary | ICD-10-CM | POA: Diagnosis not present

## 2022-02-18 DIAGNOSIS — R262 Difficulty in walking, not elsewhere classified: Secondary | ICD-10-CM | POA: Diagnosis not present

## 2022-02-18 DIAGNOSIS — S42201D Unspecified fracture of upper end of right humerus, subsequent encounter for fracture with routine healing: Secondary | ICD-10-CM | POA: Diagnosis not present

## 2022-02-18 DIAGNOSIS — Z9181 History of falling: Secondary | ICD-10-CM | POA: Diagnosis not present

## 2022-02-18 DIAGNOSIS — S42402D Unspecified fracture of lower end of left humerus, subsequent encounter for fracture with routine healing: Secondary | ICD-10-CM | POA: Diagnosis not present

## 2022-02-18 DIAGNOSIS — I495 Sick sinus syndrome: Secondary | ICD-10-CM | POA: Diagnosis not present

## 2022-02-18 DIAGNOSIS — R278 Other lack of coordination: Secondary | ICD-10-CM | POA: Diagnosis not present

## 2022-02-18 DIAGNOSIS — I442 Atrioventricular block, complete: Secondary | ICD-10-CM | POA: Diagnosis not present

## 2022-02-18 DIAGNOSIS — M7022 Olecranon bursitis, left elbow: Secondary | ICD-10-CM | POA: Diagnosis not present

## 2022-02-18 NOTE — Telephone Encounter (Signed)
        Patient  visited [facility] on 2/6     Telephone encounter attempt : 1st     Unable to leave a message     [facility], [place] [address], [place], [place] [address] Phone: [phone] Email: [email or web address]

## 2022-02-19 DIAGNOSIS — S42402D Unspecified fracture of lower end of left humerus, subsequent encounter for fracture with routine healing: Secondary | ICD-10-CM | POA: Diagnosis not present

## 2022-02-19 DIAGNOSIS — Z9181 History of falling: Secondary | ICD-10-CM | POA: Diagnosis not present

## 2022-02-19 DIAGNOSIS — R278 Other lack of coordination: Secondary | ICD-10-CM | POA: Diagnosis not present

## 2022-02-19 DIAGNOSIS — M7022 Olecranon bursitis, left elbow: Secondary | ICD-10-CM | POA: Diagnosis not present

## 2022-02-19 DIAGNOSIS — Z950 Presence of cardiac pacemaker: Secondary | ICD-10-CM | POA: Diagnosis not present

## 2022-02-19 DIAGNOSIS — R2689 Other abnormalities of gait and mobility: Secondary | ICD-10-CM | POA: Diagnosis not present

## 2022-02-19 DIAGNOSIS — M6281 Muscle weakness (generalized): Secondary | ICD-10-CM | POA: Diagnosis not present

## 2022-02-19 DIAGNOSIS — R262 Difficulty in walking, not elsewhere classified: Secondary | ICD-10-CM | POA: Diagnosis not present

## 2022-02-19 DIAGNOSIS — I495 Sick sinus syndrome: Secondary | ICD-10-CM | POA: Diagnosis not present

## 2022-02-19 DIAGNOSIS — M19122 Post-traumatic osteoarthritis, left elbow: Secondary | ICD-10-CM | POA: Diagnosis not present

## 2022-02-19 DIAGNOSIS — S42201D Unspecified fracture of upper end of right humerus, subsequent encounter for fracture with routine healing: Secondary | ICD-10-CM | POA: Diagnosis not present

## 2022-02-19 DIAGNOSIS — E1140 Type 2 diabetes mellitus with diabetic neuropathy, unspecified: Secondary | ICD-10-CM | POA: Diagnosis not present

## 2022-02-19 DIAGNOSIS — D696 Thrombocytopenia, unspecified: Secondary | ICD-10-CM | POA: Diagnosis not present

## 2022-02-19 DIAGNOSIS — I442 Atrioventricular block, complete: Secondary | ICD-10-CM | POA: Diagnosis not present

## 2022-02-19 DIAGNOSIS — R2681 Unsteadiness on feet: Secondary | ICD-10-CM | POA: Diagnosis not present

## 2022-02-20 DIAGNOSIS — R319 Hematuria, unspecified: Secondary | ICD-10-CM | POA: Diagnosis not present

## 2022-02-20 DIAGNOSIS — I10 Essential (primary) hypertension: Secondary | ICD-10-CM | POA: Diagnosis not present

## 2022-02-20 LAB — CBC AND DIFFERENTIAL
HCT: 32 — AB (ref 41–53)
Hemoglobin: 10.7 — AB (ref 13.5–17.5)
Neutrophils Absolute: 4736
Platelets: 143 10*3/uL — AB (ref 150–400)
WBC: 6.4

## 2022-02-20 LAB — CBC: RBC: 3.49 — AB (ref 3.87–5.11)

## 2022-02-21 ENCOUNTER — Telehealth: Payer: Self-pay

## 2022-02-21 ENCOUNTER — Encounter: Payer: Self-pay | Admitting: Student

## 2022-02-21 ENCOUNTER — Non-Acute Institutional Stay (SKILLED_NURSING_FACILITY): Payer: Medicare Other | Admitting: Student

## 2022-02-21 DIAGNOSIS — R319 Hematuria, unspecified: Secondary | ICD-10-CM | POA: Diagnosis not present

## 2022-02-21 DIAGNOSIS — I700 Atherosclerosis of aorta: Secondary | ICD-10-CM

## 2022-02-21 DIAGNOSIS — I482 Chronic atrial fibrillation, unspecified: Secondary | ICD-10-CM

## 2022-02-21 DIAGNOSIS — S42209S Unspecified fracture of upper end of unspecified humerus, sequela: Secondary | ICD-10-CM

## 2022-02-21 DIAGNOSIS — I5032 Chronic diastolic (congestive) heart failure: Secondary | ICD-10-CM

## 2022-02-21 DIAGNOSIS — D696 Thrombocytopenia, unspecified: Secondary | ICD-10-CM

## 2022-02-21 DIAGNOSIS — S42402D Unspecified fracture of lower end of left humerus, subsequent encounter for fracture with routine healing: Secondary | ICD-10-CM

## 2022-02-21 DIAGNOSIS — R634 Abnormal weight loss: Secondary | ICD-10-CM | POA: Diagnosis not present

## 2022-02-21 DIAGNOSIS — E1140 Type 2 diabetes mellitus with diabetic neuropathy, unspecified: Secondary | ICD-10-CM | POA: Diagnosis not present

## 2022-02-21 DIAGNOSIS — I4892 Unspecified atrial flutter: Secondary | ICD-10-CM | POA: Diagnosis not present

## 2022-02-21 NOTE — Progress Notes (Unsigned)
Location:  Other [place].  Nursing Home Room Number: [place]:  SNF [phone]) Provider:  Dr. Dewayne Shorter  [id number], Theophilus Kinds, MD  Patient Care Team: Venia Carbon, MD as PCP - General (Internal Medicine) Minna Merritts, MD as PCP - Cardiology (Cardiology) Minna Merritts, MD as Consulting Physician (Cardiology)  Extended Emergency Contact Information Primary Emergency Contact: [place], [place] [facility]) Mobile Phone: [phone] Relation: Son  Code Status:  DNR Goals of care: Advanced Directive information    02/21/2022    1:03 PM  Advanced Directives  Does Patient Have a Medical Advance Directive? Yes  Type of Paramedic of [place];Out of facility DNR (pink MOST or yellow form);Living will  Does patient want to make changes to medical advance directive? No - Patient declined  Copy of [place] in Chart? Yes - validated most recent copy scanned in chart (See row information)     Chief Complaint  Patient presents with   Acute Visit    Hematuria     HPI:  Pt is a 89 y.o. male seen today for an acute visit for    Past Medical History:  Diagnosis Date   Asthma    Atrial flutter ([place]) 2007   Band keratopathy    Benign prostatic hypertrophy    Chronic ear infection    Chronic kidney disease    Chronic prostatitis    Dental bridge present    permanent - upper   Diabetes mellitus    Elbow stiffness, left    s/p fracture many yrs ago.  arm does not straighten   GERD (gastroesophageal reflux disease)    laryngeal involvement   Hyperlipidemia    Hypertension    Obstructive sleep apnea    CPAP-9   Osteoarthrosis, localized, primary, knee    post-traumatic   Prostatitis    Stroke ([place])    TIA   Tachy-brady syndrome ([place])    UTI (urinary tract infection)    Past Surgical History:  Procedure Laterality Date   APPENDECTOMY     BELPHAROPTOSIS REPAIR     Dr [name]---didn't resolve  weepy eye and eyelid drooping   CARDIOVERSION  04/13/2010   CATARACT EXTRACTION, BILATERAL  2009   Chest pain  8/12   Stress test benign   ESOPHAGEAL DILATION  05/26/2016   Procedure: ESOPHAGEAL DILATION;  Surgeon: Lucilla Lame, MD;  Location: [place];  Service: Endoscopy;;   ESOPHAGOGASTRODUODENOSCOPY (EGD) WITH PROPOFOL N/A 05/26/2016   Procedure: ESOPHAGOGASTRODUODENOSCOPY (EGD) WITH PROPOFOL;  Surgeon: Lucilla Lame, MD;  Location: [place];  Service: Endoscopy;  Laterality: N/A;  Diabetic - oral meds sleep apnea   EYE SURGERY     FRACTURE SURGERY Left    elbow   KNEE SURGERY  1998   plate after fracture, then removed for infection   left elbow surgery     MASTOIDECTOMY  8/08   Dr Idelle Crouch   PACEMAKER IMPLANT N/A 09/02/2021   Procedure: PACEMAKER IMPLANT;  Surgeon: Vickie Epley, MD;  Location: [place] [facility];  Service: Cardiovascular;  Laterality: N/A;   RHINOPLASTY  5/10   and septoplasty   SUBACROMIAL DECOMPRESSION Right 2005   Arthroscopic (for rotator cuff and biceps tendon ruptures)   TEAR DUCT PROBING  11/13   Dr Vickki Muff   TOTAL KNEE ARTHROPLASTY Left 09/01/2014   Procedure: TOTAL KNEE ARTHROPLASTY;  Surgeon: Dereck Leep, MD;  Location: [facility];  Service: Orthopedics;  Laterality: Left;  TRIGGER FINGER RELEASE Left 02/25/2015   Procedure: LEFT LONG TRIGGER RELEASE;  Surgeon: Dereck Leep, MD;  Location: [facility];  Service: Orthopedics;  Laterality: Left;   VENOUS ABLATION      Allergies  Allergen Reactions   Lovenox [Enoxaparin] Hives   Proscar [Finasteride] Other (See Comments)    Upper body and arm weakness   Latex Rash    Has trouble with BANDAIDS that have been left on for more than 24 hours. Prefers paper tape.   Nickel Rash    Localized rash   Percocet [Oxycodone-Acetaminophen] Rash   Tape Rash and Other (See Comments)    Sensitivity     Outpatient Encounter Medications as of 02/21/2022  Medication Sig   acetaminophen  (TYLENOL) 500 MG tablet Take 1,000 mg by mouth every 8 (eight) hours as needed.   apixaban (ELIQUIS) 2.5 MG TABS tablet Take 2.5 mg by mouth 2 (two) times daily.   APPLE CIDER VINEGAR PO Take 1 capsule by mouth at bedtime.   atorvastatin (LIPITOR) 80 MG tablet Take 1 tablet (80 mg total) by mouth daily.   cetirizine (ZYRTEC) 5 MG tablet Take 5 mg by mouth daily.   Cranberry 250 MG TABS Take 1 tablet by mouth 2 (two) times daily.   Cyanocobalamin (B-12 PO) Take 1,000 mg by mouth daily.   docusate sodium (COLACE) 100 MG capsule Take 100 mg by mouth 2 (two) times daily.   finasteride (PROSCAR) 5 MG tablet Take 1 tablet (5 mg total) by mouth daily.   guaiFENesin (TUSSIN PO) Take 10 mLs by mouth every 4 (four) hours as needed.   lidocaine (LIDODERM) 5 % Place 1 patch onto the skin daily. Remove & Discard patch within 12 hours or as directed by MD   Magnesium Oxide 400 MG CAPS Take 1 capsule (400 mg total) by mouth daily.   metFORMIN (GLUCOPHAGE) 500 MG tablet TAKE 1 TABLET BY MOUTH  TWICE DAILY WITH MEALS   methocarbamol (ROBAXIN) 500 MG tablet Take 1 tablet (500 mg total) by mouth every 8 (eight) hours as needed for muscle spasms.   Polyethyl Glycol-Propyl Glycol (SYSTANE ULTRA OP) Place 1 drop into both eyes daily as needed (dry eyes).   polyethylene glycol (MIRALAX) 17 g packet Take 17 g by mouth 2 (two) times daily.   Protein (PROSOURCE PLUS PO) Give one every shift.   senna (SENOKOT) 8.6 MG TABS tablet Take 1 tablet (8.6 mg total) by mouth daily.   sertraline (ZOLOFT) 50 MG tablet Take 50 mg by mouth daily.   silver sulfADIAZINE (SILVADENE) 1 % cream Apply 1 Application topically daily.   tamsulosin (FLOMAX) 0.4 MG CAPS capsule Take 0.8 mg by mouth at bedtime.   traMADol (ULTRAM) 50 MG tablet Take 1 tablet (50 mg total) by mouth every 6 (six) hours as needed for moderate pain.   [DISCONTINUED] sodium chloride 0.9 % SOLN by CRRT route as needed.  Use 1 liter intravenously every shift for fluid  resuscitation 250cc/hr then decrease to 100cc/hr x one liter then d/c   No facility-administered encounter medications on file as of 02/21/2022.    Review of Systems  Immunization History  Administered Date(s) Administered   Fluad Quad(high Dose 65+) 09/14/2018   H1N1 12/13/2007   Influenza Split 09/11/2010   Influenza Whole 09/12/2011   Influenza, High Dose Seasonal PF 09/20/2012, 09/07/2017   Influenza, Seasonal, Injecte, Preservative Fre 09/08/2007, 09/26/2008, 10/02/2014, 09/18/2015   Influenza,inj,Quad PF,6+ Mos 09/19/2013, 09/21/2015   Influenza-Unspecified 09/07/2017, 09/11/2019, 09/03/2020, 10/19/2021  Moderna Sars-Covid-2 Vaccination 01/18/2019, 02/15/2019, 10/17/2020, 06/01/2021   Pneumococcal Conjugate-13 09/19/2013   Pneumococcal Polysaccharide-23 09/26/2003, 06/28/2016   Td 08/04/1998   Tdap 06/04/2010, 08/15/2021   Tetanus 08/04/1998   Unspecified SARS-COV-2 Vaccination 06/01/2021   Zoster, Live 01/16/2004   Pertinent  Health Maintenance Due  Topic Date Due   HEMOGLOBIN A1C  08/25/2021   OPHTHALMOLOGY EXAM  04/17/2022   FOOT EXAM  08/20/2022   INFLUENZA VACCINE  Completed      12/05/2021    8:00 AM 12/05/2021   10:38 PM 12/06/2021    7:05 AM 12/06/2021    7:40 PM 12/07/2021    8:45 AM  Fall Risk  (RETIRED) Patient Fall Risk Level High fall risk Moderate fall risk High fall risk High fall risk High fall risk   Functional Status Survey:    Vitals:   02/21/22 1250  BP: 111/71  Pulse: 73  Resp: 16  Temp: 98.4 F (36.9 C)  SpO2: 95%  Weight: 162 lb 8 oz (73.7 kg)  Height: 5' 10"$  (1.778 m)   Body mass index is 23.32 kg/m. Physical Exam  Labs reviewed: Recent Labs    09/02/21 0311 09/03/21 0457 09/16/21 0949 12/04/21 0118 12/04/21 0500 12/06/21 0348 02/08/22 1438 02/14/22 0000  NA 138 137  --    < > 136 132* 134* 138  K 3.6 3.9  --    < > 3.6 3.7 3.9 3.5  CL 109 105  --    < > 105 103 96* 103  CO2 25 25  --    < > 25 22 24 $ 31*  GLUCOSE  125* 112*  --    < > 164* 166* 281*  --   BUN 26* 22  --    < > 25* 26* 50* 37*  CREATININE 0.91 0.92  --    < > 0.79 0.79 1.20 1.0  CALCIUM 10.0 10.5*  --    < > 9.8 9.8 10.0 9.8  MG 1.5* 1.6* 1.6  --   --   --   --   --    < > = values in this interval not displayed.   Recent Labs    02/25/21 1507 02/08/22 1438  AST 15 42*  ALT 11 25  ALKPHOS 66 77  BILITOT 0.8 1.0  PROT 6.9 6.6  ALBUMIN 3.8 2.9*   Recent Labs    12/04/21 0118 12/04/21 0500 12/06/21 0348 12/07/21 0623 02/08/22 1438 02/14/22 0000 02/20/22 0000  WBC 12.3*   < > 11.0* 10.6* 11.9* 5.4 6.4  NEUTROABS 10.2*  --   --   --   --  3,440.00 4,736.00  HGB 12.0*   < > 10.4* 10.7* 12.5* 11.5* 10.7*  HCT 34.8*   < > 30.8* 30.9* 39.0 34* 32*  MCV 92.3   < > 93.1 93.1 94.2  --   --   PLT 174   < > 138* 139* 189 110* 143*   < > = values in this interval not displayed.   Lab Results  Component Value Date   TSH 0.76 12/11/2012   Lab Results  Component Value Date   HGBA1C 7.6 (H) 02/25/2021   Lab Results  Component Value Date   CHOL 131 02/25/2021   HDL 70.00 02/25/2021   LDLCALC 47 02/25/2021   TRIG 67.0 02/25/2021   CHOLHDL 2 02/25/2021    Significant Diagnostic Results in last 30 days:  CT ABDOMEN PELVIS W CONTRAST  Result Date: 02/08/2022 CLINICAL DATA:  Suspected  bowel obstruction. Fecal impaction. Rectal bleeding. EXAM: CT ABDOMEN AND PELVIS WITH CONTRAST TECHNIQUE: Multidetector CT imaging of the abdomen and pelvis was performed using the standard protocol following bolus administration of intravenous contrast. RADIATION DOSE REDUCTION: This exam was performed according to the departmental dose-optimization program which includes automated exposure control, adjustment of the mA and/or kV according to patient size and/or use of iterative reconstruction technique. CONTRAST:  149m OMNIPAQUE IOHEXOL 300 MG/ML  SOLN COMPARISON:  None Available. FINDINGS: Lower chest: Subpleural reticulation in the bilateral lung  bases. Enlarged heart. Calcific atherosclerotic disease of the coronary arteries and aorta. Hepatobiliary: Normal appearance of the liver. Cholelithiasis without evidence of acute cholecystitis. Pancreas: 1.7 cm hypoattenuated lesion in the body of the pancreas. Spleen: Normal in size without focal abnormality. Adrenals/Urinary Tract: Adrenal glands are unremarkable. Kidneys are normal, without renal calculi, focal lesion, or hydronephrosis. Bladder is decompressed around urinary Foley. Stomach/Bowel: No evidence of small-bowel obstruction. Large amount of formed stool in the left colon and rectum. The rectum is distended to 10 cm. No evidence of free gas in the abdomen. Vascular/Lymphatic: Aortic atherosclerosis. No enlarged abdominal or pelvic lymph nodes. Reproductive: Nonenlarged prostate gland. Other: No abdominal wall hernia or abnormality. No abdominopelvic ascites. Musculoskeletal: No acute osseous findings. Spondylosis of the spine. IMPRESSION: 1. Large amount of formed stool in the left colon and rectum. The rectum is distended to 10 cm. 2. No evidence of small-bowel obstruction. 3. Cholelithiasis without evidence of acute cholecystitis. 4. 1.7 cm hypoattenuated lesion in the body of the pancreas. Further evaluation with pancreatic protocol MRI may be considered on a nonemergent basis, if found clinically appropriate. 5. Enlarged heart with calcific atherosclerotic disease of the coronary arteries and aorta. 6. Subpleural reticulation in the bilateral lung bases, which may represent interstitial lung disease. 7. Aortic atherosclerosis. Aortic Atherosclerosis (ICD10-I70.0). Electronically Signed   By: DFidela SalisburyM.D.   On: 02/08/2022 17:46    Assessment/Plan There are no diagnoses linked to this encounter.   Family/ staff Communication: ***  Labs/tests ordered:  ***

## 2022-02-21 NOTE — Telephone Encounter (Signed)
       Patient  visited [place] on 2/6   Telephone encounter attempt :  2nd  A HIPAA compliant voice message was left requesting a return call.  Instructed patient to call back    [place] [phone] [address], [place], [place] [address] Phone: [phone] Email: [email or web address]

## 2022-02-22 DIAGNOSIS — R2689 Other abnormalities of gait and mobility: Secondary | ICD-10-CM | POA: Diagnosis not present

## 2022-02-22 DIAGNOSIS — E1140 Type 2 diabetes mellitus with diabetic neuropathy, unspecified: Secondary | ICD-10-CM | POA: Diagnosis not present

## 2022-02-22 DIAGNOSIS — D696 Thrombocytopenia, unspecified: Secondary | ICD-10-CM | POA: Diagnosis not present

## 2022-02-22 DIAGNOSIS — M7022 Olecranon bursitis, left elbow: Secondary | ICD-10-CM | POA: Diagnosis not present

## 2022-02-22 DIAGNOSIS — R262 Difficulty in walking, not elsewhere classified: Secondary | ICD-10-CM | POA: Diagnosis not present

## 2022-02-22 DIAGNOSIS — R278 Other lack of coordination: Secondary | ICD-10-CM | POA: Diagnosis not present

## 2022-02-22 DIAGNOSIS — I495 Sick sinus syndrome: Secondary | ICD-10-CM | POA: Diagnosis not present

## 2022-02-22 DIAGNOSIS — Z950 Presence of cardiac pacemaker: Secondary | ICD-10-CM | POA: Diagnosis not present

## 2022-02-22 DIAGNOSIS — Z9181 History of falling: Secondary | ICD-10-CM | POA: Diagnosis not present

## 2022-02-22 DIAGNOSIS — S42402D Unspecified fracture of lower end of left humerus, subsequent encounter for fracture with routine healing: Secondary | ICD-10-CM | POA: Diagnosis not present

## 2022-02-22 DIAGNOSIS — R2681 Unsteadiness on feet: Secondary | ICD-10-CM | POA: Diagnosis not present

## 2022-02-22 DIAGNOSIS — M19122 Post-traumatic osteoarthritis, left elbow: Secondary | ICD-10-CM | POA: Diagnosis not present

## 2022-02-22 DIAGNOSIS — I442 Atrioventricular block, complete: Secondary | ICD-10-CM | POA: Diagnosis not present

## 2022-02-22 DIAGNOSIS — S42201D Unspecified fracture of upper end of right humerus, subsequent encounter for fracture with routine healing: Secondary | ICD-10-CM | POA: Diagnosis not present

## 2022-02-22 DIAGNOSIS — M6281 Muscle weakness (generalized): Secondary | ICD-10-CM | POA: Diagnosis not present

## 2022-02-22 MED ORDER — MIRTAZAPINE 7.5 MG PO TABS: 7.5000 mg | ORAL_TABLET | Freq: Every day | ORAL | Status: DC

## 2022-02-23 DIAGNOSIS — D696 Thrombocytopenia, unspecified: Secondary | ICD-10-CM | POA: Diagnosis not present

## 2022-02-23 DIAGNOSIS — M7022 Olecranon bursitis, left elbow: Secondary | ICD-10-CM | POA: Diagnosis not present

## 2022-02-23 DIAGNOSIS — M6281 Muscle weakness (generalized): Secondary | ICD-10-CM | POA: Diagnosis not present

## 2022-02-23 DIAGNOSIS — R278 Other lack of coordination: Secondary | ICD-10-CM | POA: Diagnosis not present

## 2022-02-23 DIAGNOSIS — R2681 Unsteadiness on feet: Secondary | ICD-10-CM | POA: Diagnosis not present

## 2022-02-23 DIAGNOSIS — S42201D Unspecified fracture of upper end of right humerus, subsequent encounter for fracture with routine healing: Secondary | ICD-10-CM | POA: Diagnosis not present

## 2022-02-23 DIAGNOSIS — R262 Difficulty in walking, not elsewhere classified: Secondary | ICD-10-CM | POA: Diagnosis not present

## 2022-02-23 DIAGNOSIS — I495 Sick sinus syndrome: Secondary | ICD-10-CM | POA: Diagnosis not present

## 2022-02-23 DIAGNOSIS — Z950 Presence of cardiac pacemaker: Secondary | ICD-10-CM | POA: Diagnosis not present

## 2022-02-23 DIAGNOSIS — R2689 Other abnormalities of gait and mobility: Secondary | ICD-10-CM | POA: Diagnosis not present

## 2022-02-23 DIAGNOSIS — I442 Atrioventricular block, complete: Secondary | ICD-10-CM | POA: Diagnosis not present

## 2022-02-23 DIAGNOSIS — S42402D Unspecified fracture of lower end of left humerus, subsequent encounter for fracture with routine healing: Secondary | ICD-10-CM | POA: Diagnosis not present

## 2022-02-23 DIAGNOSIS — M19122 Post-traumatic osteoarthritis, left elbow: Secondary | ICD-10-CM | POA: Diagnosis not present

## 2022-02-23 DIAGNOSIS — E1140 Type 2 diabetes mellitus with diabetic neuropathy, unspecified: Secondary | ICD-10-CM | POA: Diagnosis not present

## 2022-02-23 DIAGNOSIS — Z9181 History of falling: Secondary | ICD-10-CM | POA: Diagnosis not present

## 2022-02-24 DIAGNOSIS — N390 Urinary tract infection, site not specified: Secondary | ICD-10-CM | POA: Diagnosis not present

## 2022-02-24 DIAGNOSIS — I5032 Chronic diastolic (congestive) heart failure: Secondary | ICD-10-CM | POA: Diagnosis not present

## 2022-02-24 LAB — CBC AND DIFFERENTIAL
HCT: 33 — AB (ref 41–53)
Hemoglobin: 10.7 — AB (ref 13.5–17.5)
Neutrophils Absolute: 4720
Platelets: 185 10*3/uL (ref 150–400)
WBC: 6.9

## 2022-02-24 LAB — CBC: RBC: 3.56 — AB (ref 3.87–5.11)

## 2022-02-27 DIAGNOSIS — R262 Difficulty in walking, not elsewhere classified: Secondary | ICD-10-CM | POA: Diagnosis not present

## 2022-02-27 DIAGNOSIS — R2689 Other abnormalities of gait and mobility: Secondary | ICD-10-CM | POA: Diagnosis not present

## 2022-02-27 DIAGNOSIS — Z950 Presence of cardiac pacemaker: Secondary | ICD-10-CM | POA: Diagnosis not present

## 2022-02-27 DIAGNOSIS — Z9181 History of falling: Secondary | ICD-10-CM | POA: Diagnosis not present

## 2022-02-27 DIAGNOSIS — M19122 Post-traumatic osteoarthritis, left elbow: Secondary | ICD-10-CM | POA: Diagnosis not present

## 2022-02-27 DIAGNOSIS — D696 Thrombocytopenia, unspecified: Secondary | ICD-10-CM | POA: Diagnosis not present

## 2022-02-27 DIAGNOSIS — M6281 Muscle weakness (generalized): Secondary | ICD-10-CM | POA: Diagnosis not present

## 2022-02-27 DIAGNOSIS — E1140 Type 2 diabetes mellitus with diabetic neuropathy, unspecified: Secondary | ICD-10-CM | POA: Diagnosis not present

## 2022-02-27 DIAGNOSIS — S42201D Unspecified fracture of upper end of right humerus, subsequent encounter for fracture with routine healing: Secondary | ICD-10-CM | POA: Diagnosis not present

## 2022-02-27 DIAGNOSIS — R2681 Unsteadiness on feet: Secondary | ICD-10-CM | POA: Diagnosis not present

## 2022-02-27 DIAGNOSIS — M7022 Olecranon bursitis, left elbow: Secondary | ICD-10-CM | POA: Diagnosis not present

## 2022-02-27 DIAGNOSIS — S42402D Unspecified fracture of lower end of left humerus, subsequent encounter for fracture with routine healing: Secondary | ICD-10-CM | POA: Diagnosis not present

## 2022-02-27 DIAGNOSIS — I495 Sick sinus syndrome: Secondary | ICD-10-CM | POA: Diagnosis not present

## 2022-02-27 DIAGNOSIS — I442 Atrioventricular block, complete: Secondary | ICD-10-CM | POA: Diagnosis not present

## 2022-02-27 DIAGNOSIS — R278 Other lack of coordination: Secondary | ICD-10-CM | POA: Diagnosis not present

## 2022-02-28 DIAGNOSIS — E1140 Type 2 diabetes mellitus with diabetic neuropathy, unspecified: Secondary | ICD-10-CM | POA: Diagnosis not present

## 2022-02-28 DIAGNOSIS — R262 Difficulty in walking, not elsewhere classified: Secondary | ICD-10-CM | POA: Diagnosis not present

## 2022-02-28 DIAGNOSIS — S42402D Unspecified fracture of lower end of left humerus, subsequent encounter for fracture with routine healing: Secondary | ICD-10-CM | POA: Diagnosis not present

## 2022-02-28 DIAGNOSIS — M6281 Muscle weakness (generalized): Secondary | ICD-10-CM | POA: Diagnosis not present

## 2022-02-28 DIAGNOSIS — M19122 Post-traumatic osteoarthritis, left elbow: Secondary | ICD-10-CM | POA: Diagnosis not present

## 2022-02-28 DIAGNOSIS — Z9181 History of falling: Secondary | ICD-10-CM | POA: Diagnosis not present

## 2022-02-28 DIAGNOSIS — Z950 Presence of cardiac pacemaker: Secondary | ICD-10-CM | POA: Diagnosis not present

## 2022-02-28 DIAGNOSIS — M7022 Olecranon bursitis, left elbow: Secondary | ICD-10-CM | POA: Diagnosis not present

## 2022-02-28 DIAGNOSIS — R2681 Unsteadiness on feet: Secondary | ICD-10-CM | POA: Diagnosis not present

## 2022-02-28 DIAGNOSIS — S42201D Unspecified fracture of upper end of right humerus, subsequent encounter for fracture with routine healing: Secondary | ICD-10-CM | POA: Diagnosis not present

## 2022-02-28 DIAGNOSIS — I495 Sick sinus syndrome: Secondary | ICD-10-CM | POA: Diagnosis not present

## 2022-02-28 DIAGNOSIS — D696 Thrombocytopenia, unspecified: Secondary | ICD-10-CM | POA: Diagnosis not present

## 2022-02-28 DIAGNOSIS — I442 Atrioventricular block, complete: Secondary | ICD-10-CM | POA: Diagnosis not present

## 2022-02-28 DIAGNOSIS — R278 Other lack of coordination: Secondary | ICD-10-CM | POA: Diagnosis not present

## 2022-02-28 DIAGNOSIS — R2689 Other abnormalities of gait and mobility: Secondary | ICD-10-CM | POA: Diagnosis not present

## 2022-03-02 ENCOUNTER — Ambulatory Visit: Payer: Medicare Other | Admitting: Internal Medicine

## 2022-03-02 DIAGNOSIS — Z950 Presence of cardiac pacemaker: Secondary | ICD-10-CM | POA: Diagnosis not present

## 2022-03-02 DIAGNOSIS — S42201D Unspecified fracture of upper end of right humerus, subsequent encounter for fracture with routine healing: Secondary | ICD-10-CM | POA: Diagnosis not present

## 2022-03-02 DIAGNOSIS — I495 Sick sinus syndrome: Secondary | ICD-10-CM | POA: Diagnosis not present

## 2022-03-02 DIAGNOSIS — M6281 Muscle weakness (generalized): Secondary | ICD-10-CM | POA: Diagnosis not present

## 2022-03-02 DIAGNOSIS — R278 Other lack of coordination: Secondary | ICD-10-CM | POA: Diagnosis not present

## 2022-03-02 DIAGNOSIS — I442 Atrioventricular block, complete: Secondary | ICD-10-CM | POA: Diagnosis not present

## 2022-03-02 DIAGNOSIS — R2681 Unsteadiness on feet: Secondary | ICD-10-CM | POA: Diagnosis not present

## 2022-03-02 DIAGNOSIS — M19122 Post-traumatic osteoarthritis, left elbow: Secondary | ICD-10-CM | POA: Diagnosis not present

## 2022-03-02 DIAGNOSIS — M7022 Olecranon bursitis, left elbow: Secondary | ICD-10-CM | POA: Diagnosis not present

## 2022-03-02 DIAGNOSIS — R2689 Other abnormalities of gait and mobility: Secondary | ICD-10-CM | POA: Diagnosis not present

## 2022-03-02 DIAGNOSIS — R262 Difficulty in walking, not elsewhere classified: Secondary | ICD-10-CM | POA: Diagnosis not present

## 2022-03-02 DIAGNOSIS — S42402D Unspecified fracture of lower end of left humerus, subsequent encounter for fracture with routine healing: Secondary | ICD-10-CM | POA: Diagnosis not present

## 2022-03-02 DIAGNOSIS — D696 Thrombocytopenia, unspecified: Secondary | ICD-10-CM | POA: Diagnosis not present

## 2022-03-02 DIAGNOSIS — Z9181 History of falling: Secondary | ICD-10-CM | POA: Diagnosis not present

## 2022-03-02 DIAGNOSIS — E1140 Type 2 diabetes mellitus with diabetic neuropathy, unspecified: Secondary | ICD-10-CM | POA: Diagnosis not present

## 2022-03-03 ENCOUNTER — Encounter: Payer: Self-pay | Admitting: Podiatry

## 2022-03-03 ENCOUNTER — Ambulatory Visit (INDEPENDENT_AMBULATORY_CARE_PROVIDER_SITE_OTHER): Payer: Medicare Other | Admitting: Podiatry

## 2022-03-03 DIAGNOSIS — I442 Atrioventricular block, complete: Secondary | ICD-10-CM | POA: Diagnosis not present

## 2022-03-03 DIAGNOSIS — R2681 Unsteadiness on feet: Secondary | ICD-10-CM | POA: Diagnosis not present

## 2022-03-03 DIAGNOSIS — Z9181 History of falling: Secondary | ICD-10-CM | POA: Diagnosis not present

## 2022-03-03 DIAGNOSIS — B351 Tinea unguium: Secondary | ICD-10-CM

## 2022-03-03 DIAGNOSIS — M79675 Pain in left toe(s): Secondary | ICD-10-CM

## 2022-03-03 DIAGNOSIS — R278 Other lack of coordination: Secondary | ICD-10-CM | POA: Diagnosis not present

## 2022-03-03 DIAGNOSIS — E1140 Type 2 diabetes mellitus with diabetic neuropathy, unspecified: Secondary | ICD-10-CM

## 2022-03-03 DIAGNOSIS — R2689 Other abnormalities of gait and mobility: Secondary | ICD-10-CM | POA: Diagnosis not present

## 2022-03-03 DIAGNOSIS — S42402D Unspecified fracture of lower end of left humerus, subsequent encounter for fracture with routine healing: Secondary | ICD-10-CM | POA: Diagnosis not present

## 2022-03-03 DIAGNOSIS — M7022 Olecranon bursitis, left elbow: Secondary | ICD-10-CM | POA: Diagnosis not present

## 2022-03-03 DIAGNOSIS — S42201D Unspecified fracture of upper end of right humerus, subsequent encounter for fracture with routine healing: Secondary | ICD-10-CM | POA: Diagnosis not present

## 2022-03-03 DIAGNOSIS — Z950 Presence of cardiac pacemaker: Secondary | ICD-10-CM | POA: Diagnosis not present

## 2022-03-03 DIAGNOSIS — R262 Difficulty in walking, not elsewhere classified: Secondary | ICD-10-CM | POA: Diagnosis not present

## 2022-03-03 DIAGNOSIS — M6281 Muscle weakness (generalized): Secondary | ICD-10-CM | POA: Diagnosis not present

## 2022-03-03 DIAGNOSIS — M79674 Pain in right toe(s): Secondary | ICD-10-CM | POA: Diagnosis not present

## 2022-03-03 DIAGNOSIS — D696 Thrombocytopenia, unspecified: Secondary | ICD-10-CM | POA: Diagnosis not present

## 2022-03-03 DIAGNOSIS — I495 Sick sinus syndrome: Secondary | ICD-10-CM | POA: Diagnosis not present

## 2022-03-03 DIAGNOSIS — M19122 Post-traumatic osteoarthritis, left elbow: Secondary | ICD-10-CM | POA: Diagnosis not present

## 2022-03-03 NOTE — Progress Notes (Signed)
This patient returns to my office for at risk foot care.  This patient requires this care by a professional since this patient will be at risk due to having diabetic neuropathy.  This patient is unable to cut nails himself since the patient cannot reach his nails.These nails are painful walking and wearing shoes.  This patient presents for at risk foot care today.  General Appearance  Alert, conversant and in no acute stress.  Vascular  Dorsalis pedis and posterior tibial  pulses are  weakly  palpable  bilaterally.  Capillary return is within normal limits  bilaterally. Cold feet  bilaterally.  Neurologic  Senn-Weinstein monofilament wire test within normal limits  bilaterally. Muscle power within normal limits bilaterally.  Nails Thick disfigured discolored nails with subungual debris  from hallux to fifth toes bilaterally. No evidence of bacterial infection or drainage bilaterally.  Orthopedic  No limitations of motion  feet .  No crepitus or effusions noted.  No bony pathology or digital deformities noted.  Skin  normotropic skin with no porokeratosis noted bilaterally.  No signs of infections or ulcers noted.     Onychomycosis  Pain in right toes  Pain in left toes  Consent was obtained for treatment procedures.   Mechanical debridement of nails 1-5  bilaterally performed with a nail nipper.  Filed with dremel without incident.    Return office visit   3 months                  Told patient to return for periodic foot care and evaluation due to potential at risk complications.   Gardiner Barefoot DPM

## 2022-03-04 DIAGNOSIS — D696 Thrombocytopenia, unspecified: Secondary | ICD-10-CM | POA: Diagnosis not present

## 2022-03-04 DIAGNOSIS — R2681 Unsteadiness on feet: Secondary | ICD-10-CM | POA: Diagnosis not present

## 2022-03-04 DIAGNOSIS — R2689 Other abnormalities of gait and mobility: Secondary | ICD-10-CM | POA: Diagnosis not present

## 2022-03-04 DIAGNOSIS — Z950 Presence of cardiac pacemaker: Secondary | ICD-10-CM | POA: Diagnosis not present

## 2022-03-04 DIAGNOSIS — S42402D Unspecified fracture of lower end of left humerus, subsequent encounter for fracture with routine healing: Secondary | ICD-10-CM | POA: Diagnosis not present

## 2022-03-04 DIAGNOSIS — R278 Other lack of coordination: Secondary | ICD-10-CM | POA: Diagnosis not present

## 2022-03-04 DIAGNOSIS — M6281 Muscle weakness (generalized): Secondary | ICD-10-CM | POA: Diagnosis not present

## 2022-03-04 DIAGNOSIS — S42201D Unspecified fracture of upper end of right humerus, subsequent encounter for fracture with routine healing: Secondary | ICD-10-CM | POA: Diagnosis not present

## 2022-03-04 DIAGNOSIS — E1140 Type 2 diabetes mellitus with diabetic neuropathy, unspecified: Secondary | ICD-10-CM | POA: Diagnosis not present

## 2022-03-04 DIAGNOSIS — R262 Difficulty in walking, not elsewhere classified: Secondary | ICD-10-CM | POA: Diagnosis not present

## 2022-03-04 DIAGNOSIS — I495 Sick sinus syndrome: Secondary | ICD-10-CM | POA: Diagnosis not present

## 2022-03-04 DIAGNOSIS — Z9181 History of falling: Secondary | ICD-10-CM | POA: Diagnosis not present

## 2022-03-04 DIAGNOSIS — M7022 Olecranon bursitis, left elbow: Secondary | ICD-10-CM | POA: Diagnosis not present

## 2022-03-04 DIAGNOSIS — M19122 Post-traumatic osteoarthritis, left elbow: Secondary | ICD-10-CM | POA: Diagnosis not present

## 2022-03-04 DIAGNOSIS — I442 Atrioventricular block, complete: Secondary | ICD-10-CM | POA: Diagnosis not present

## 2022-03-06 DIAGNOSIS — I495 Sick sinus syndrome: Secondary | ICD-10-CM | POA: Diagnosis not present

## 2022-03-06 DIAGNOSIS — M6281 Muscle weakness (generalized): Secondary | ICD-10-CM | POA: Diagnosis not present

## 2022-03-06 DIAGNOSIS — Z9181 History of falling: Secondary | ICD-10-CM | POA: Diagnosis not present

## 2022-03-06 DIAGNOSIS — R278 Other lack of coordination: Secondary | ICD-10-CM | POA: Diagnosis not present

## 2022-03-06 DIAGNOSIS — R2689 Other abnormalities of gait and mobility: Secondary | ICD-10-CM | POA: Diagnosis not present

## 2022-03-06 DIAGNOSIS — M19122 Post-traumatic osteoarthritis, left elbow: Secondary | ICD-10-CM | POA: Diagnosis not present

## 2022-03-06 DIAGNOSIS — S42201D Unspecified fracture of upper end of right humerus, subsequent encounter for fracture with routine healing: Secondary | ICD-10-CM | POA: Diagnosis not present

## 2022-03-06 DIAGNOSIS — E1140 Type 2 diabetes mellitus with diabetic neuropathy, unspecified: Secondary | ICD-10-CM | POA: Diagnosis not present

## 2022-03-06 DIAGNOSIS — S42402D Unspecified fracture of lower end of left humerus, subsequent encounter for fracture with routine healing: Secondary | ICD-10-CM | POA: Diagnosis not present

## 2022-03-06 DIAGNOSIS — R2681 Unsteadiness on feet: Secondary | ICD-10-CM | POA: Diagnosis not present

## 2022-03-06 DIAGNOSIS — R262 Difficulty in walking, not elsewhere classified: Secondary | ICD-10-CM | POA: Diagnosis not present

## 2022-03-06 DIAGNOSIS — D696 Thrombocytopenia, unspecified: Secondary | ICD-10-CM | POA: Diagnosis not present

## 2022-03-06 DIAGNOSIS — I442 Atrioventricular block, complete: Secondary | ICD-10-CM | POA: Diagnosis not present

## 2022-03-06 DIAGNOSIS — M7022 Olecranon bursitis, left elbow: Secondary | ICD-10-CM | POA: Diagnosis not present

## 2022-03-06 DIAGNOSIS — Z950 Presence of cardiac pacemaker: Secondary | ICD-10-CM | POA: Diagnosis not present

## 2022-03-07 DIAGNOSIS — R278 Other lack of coordination: Secondary | ICD-10-CM | POA: Diagnosis not present

## 2022-03-07 DIAGNOSIS — S42201D Unspecified fracture of upper end of right humerus, subsequent encounter for fracture with routine healing: Secondary | ICD-10-CM | POA: Diagnosis not present

## 2022-03-07 DIAGNOSIS — M7022 Olecranon bursitis, left elbow: Secondary | ICD-10-CM | POA: Diagnosis not present

## 2022-03-07 DIAGNOSIS — I442 Atrioventricular block, complete: Secondary | ICD-10-CM | POA: Diagnosis not present

## 2022-03-07 DIAGNOSIS — R262 Difficulty in walking, not elsewhere classified: Secondary | ICD-10-CM | POA: Diagnosis not present

## 2022-03-07 DIAGNOSIS — M6281 Muscle weakness (generalized): Secondary | ICD-10-CM | POA: Diagnosis not present

## 2022-03-07 DIAGNOSIS — I495 Sick sinus syndrome: Secondary | ICD-10-CM | POA: Diagnosis not present

## 2022-03-07 DIAGNOSIS — R2681 Unsteadiness on feet: Secondary | ICD-10-CM | POA: Diagnosis not present

## 2022-03-07 DIAGNOSIS — Z9181 History of falling: Secondary | ICD-10-CM | POA: Diagnosis not present

## 2022-03-07 DIAGNOSIS — R2689 Other abnormalities of gait and mobility: Secondary | ICD-10-CM | POA: Diagnosis not present

## 2022-03-07 DIAGNOSIS — M19122 Post-traumatic osteoarthritis, left elbow: Secondary | ICD-10-CM | POA: Diagnosis not present

## 2022-03-07 DIAGNOSIS — S42402D Unspecified fracture of lower end of left humerus, subsequent encounter for fracture with routine healing: Secondary | ICD-10-CM | POA: Diagnosis not present

## 2022-03-07 DIAGNOSIS — D696 Thrombocytopenia, unspecified: Secondary | ICD-10-CM | POA: Diagnosis not present

## 2022-03-07 DIAGNOSIS — E1140 Type 2 diabetes mellitus with diabetic neuropathy, unspecified: Secondary | ICD-10-CM | POA: Diagnosis not present

## 2022-03-07 DIAGNOSIS — Z950 Presence of cardiac pacemaker: Secondary | ICD-10-CM | POA: Diagnosis not present

## 2022-03-08 DIAGNOSIS — Z9181 History of falling: Secondary | ICD-10-CM | POA: Diagnosis not present

## 2022-03-08 DIAGNOSIS — I495 Sick sinus syndrome: Secondary | ICD-10-CM | POA: Diagnosis not present

## 2022-03-08 DIAGNOSIS — R262 Difficulty in walking, not elsewhere classified: Secondary | ICD-10-CM | POA: Diagnosis not present

## 2022-03-08 DIAGNOSIS — D696 Thrombocytopenia, unspecified: Secondary | ICD-10-CM | POA: Diagnosis not present

## 2022-03-08 DIAGNOSIS — I442 Atrioventricular block, complete: Secondary | ICD-10-CM | POA: Diagnosis not present

## 2022-03-08 DIAGNOSIS — M6281 Muscle weakness (generalized): Secondary | ICD-10-CM | POA: Diagnosis not present

## 2022-03-08 DIAGNOSIS — R278 Other lack of coordination: Secondary | ICD-10-CM | POA: Diagnosis not present

## 2022-03-08 DIAGNOSIS — E1140 Type 2 diabetes mellitus with diabetic neuropathy, unspecified: Secondary | ICD-10-CM | POA: Diagnosis not present

## 2022-03-08 DIAGNOSIS — S42402D Unspecified fracture of lower end of left humerus, subsequent encounter for fracture with routine healing: Secondary | ICD-10-CM | POA: Diagnosis not present

## 2022-03-08 DIAGNOSIS — R2689 Other abnormalities of gait and mobility: Secondary | ICD-10-CM | POA: Diagnosis not present

## 2022-03-08 DIAGNOSIS — R2681 Unsteadiness on feet: Secondary | ICD-10-CM | POA: Diagnosis not present

## 2022-03-08 DIAGNOSIS — Z950 Presence of cardiac pacemaker: Secondary | ICD-10-CM | POA: Diagnosis not present

## 2022-03-08 DIAGNOSIS — S42201D Unspecified fracture of upper end of right humerus, subsequent encounter for fracture with routine healing: Secondary | ICD-10-CM | POA: Diagnosis not present

## 2022-03-08 DIAGNOSIS — M19122 Post-traumatic osteoarthritis, left elbow: Secondary | ICD-10-CM | POA: Diagnosis not present

## 2022-03-08 DIAGNOSIS — M7022 Olecranon bursitis, left elbow: Secondary | ICD-10-CM | POA: Diagnosis not present

## 2022-03-09 ENCOUNTER — Non-Acute Institutional Stay (SKILLED_NURSING_FACILITY): Payer: Medicare Other | Admitting: Student

## 2022-03-09 ENCOUNTER — Encounter: Payer: Self-pay | Admitting: Student

## 2022-03-09 DIAGNOSIS — Q6470 Unspecified congenital malformation of bladder and urethra: Secondary | ICD-10-CM

## 2022-03-09 DIAGNOSIS — E1140 Type 2 diabetes mellitus with diabetic neuropathy, unspecified: Secondary | ICD-10-CM | POA: Diagnosis not present

## 2022-03-09 DIAGNOSIS — I4892 Unspecified atrial flutter: Secondary | ICD-10-CM

## 2022-03-09 DIAGNOSIS — R278 Other lack of coordination: Secondary | ICD-10-CM | POA: Diagnosis not present

## 2022-03-09 DIAGNOSIS — D696 Thrombocytopenia, unspecified: Secondary | ICD-10-CM | POA: Diagnosis not present

## 2022-03-09 DIAGNOSIS — J3489 Other specified disorders of nose and nasal sinuses: Secondary | ICD-10-CM

## 2022-03-09 DIAGNOSIS — R2689 Other abnormalities of gait and mobility: Secondary | ICD-10-CM | POA: Diagnosis not present

## 2022-03-09 DIAGNOSIS — I5032 Chronic diastolic (congestive) heart failure: Secondary | ICD-10-CM | POA: Diagnosis not present

## 2022-03-09 DIAGNOSIS — I495 Sick sinus syndrome: Secondary | ICD-10-CM | POA: Diagnosis not present

## 2022-03-09 DIAGNOSIS — M7022 Olecranon bursitis, left elbow: Secondary | ICD-10-CM | POA: Diagnosis not present

## 2022-03-09 DIAGNOSIS — Z978 Presence of other specified devices: Secondary | ICD-10-CM

## 2022-03-09 DIAGNOSIS — I482 Chronic atrial fibrillation, unspecified: Secondary | ICD-10-CM | POA: Diagnosis not present

## 2022-03-09 DIAGNOSIS — R262 Difficulty in walking, not elsewhere classified: Secondary | ICD-10-CM | POA: Diagnosis not present

## 2022-03-09 DIAGNOSIS — R2681 Unsteadiness on feet: Secondary | ICD-10-CM | POA: Diagnosis not present

## 2022-03-09 DIAGNOSIS — S42201D Unspecified fracture of upper end of right humerus, subsequent encounter for fracture with routine healing: Secondary | ICD-10-CM | POA: Diagnosis not present

## 2022-03-09 DIAGNOSIS — I442 Atrioventricular block, complete: Secondary | ICD-10-CM | POA: Diagnosis not present

## 2022-03-09 DIAGNOSIS — Z9181 History of falling: Secondary | ICD-10-CM | POA: Diagnosis not present

## 2022-03-09 DIAGNOSIS — R634 Abnormal weight loss: Secondary | ICD-10-CM | POA: Diagnosis not present

## 2022-03-09 DIAGNOSIS — Z950 Presence of cardiac pacemaker: Secondary | ICD-10-CM | POA: Diagnosis not present

## 2022-03-09 DIAGNOSIS — I10 Essential (primary) hypertension: Secondary | ICD-10-CM | POA: Diagnosis not present

## 2022-03-09 DIAGNOSIS — M19122 Post-traumatic osteoarthritis, left elbow: Secondary | ICD-10-CM | POA: Diagnosis not present

## 2022-03-09 DIAGNOSIS — S42402D Unspecified fracture of lower end of left humerus, subsequent encounter for fracture with routine healing: Secondary | ICD-10-CM | POA: Diagnosis not present

## 2022-03-09 DIAGNOSIS — M6281 Muscle weakness (generalized): Secondary | ICD-10-CM | POA: Diagnosis not present

## 2022-03-09 NOTE — Progress Notes (Signed)
Location:  Other Nursing Home Room Number: 107 A Place of Service:  SNF (31) Provider:  Wynn Banker, Jordan Hawks, MD  Patient Care Team: Dewayne Shorter, MD as PCP - General (Family Medicine) Rockey Situ Kathlene November, MD as PCP - Cardiology (Cardiology) Minna Merritts, MD as Consulting Physician (Cardiology)  Extended Emergency Contact Information Primary Emergency Contact: [place], [place] [facility]) Mobile Phone: [phone] Relation: Son  Code Status:  DNR Goals of care: Advanced Directive information    02/21/2022    1:03 PM  Advanced Directives  Does Patient Have a Medical Advance Directive? Yes  Type of Paramedic of [place];Out of facility DNR (pink MOST or yellow form);Living will  Does patient want to make changes to medical advance directive? No - Patient declined  Copy of [place] in Chart? Yes - validated most recent copy scanned in chart (See row information)     Chief Complaint  Patient presents with   catheter pain    HPI:  Pt is a 89 y.o. male seen today for an acute visit for concern for penile pain. Has a slit on one left side of the urethra. Recommended by urology to change leg of foley bag. No blood from the area.   Patient complains of thick throat secretions. Difficult to cough up.   He is planning on doing some leg exercises in the bed  His ultimate goal is to get back home and live independently.   He has had more dreams about his wife recently which has been comforting for him.   Past Medical History:  Diagnosis Date   Asthma    Atrial flutter ([place]) 2007   Band keratopathy    Benign prostatic hypertrophy    Chronic ear infection    Chronic kidney disease    Chronic prostatitis    Dental bridge present    permanent - upper   Diabetes mellitus    Elbow stiffness, left    s/p fracture many yrs ago.  arm does not straighten   GERD (gastroesophageal reflux disease)    laryngeal involvement    Hyperlipidemia    Hypertension    Obstructive sleep apnea    CPAP-9   Osteoarthrosis, localized, primary, knee    post-traumatic   Prostatitis    Stroke ([place])    TIA   Tachy-brady syndrome ([place])    UTI (urinary tract infection)    Past Surgical History:  Procedure Laterality Date   APPENDECTOMY     BELPHAROPTOSIS REPAIR     Dr [name]---didn't resolve weepy eye and eyelid drooping   CARDIOVERSION  04/13/2010   CATARACT EXTRACTION, BILATERAL  2009   Chest pain  8/12   Stress test benign   ESOPHAGEAL DILATION  05/26/2016   Procedure: ESOPHAGEAL DILATION;  Surgeon: Lucilla Lame, MD;  Location: [place];  Service: Endoscopy;;   ESOPHAGOGASTRODUODENOSCOPY (EGD) WITH PROPOFOL N/A 05/26/2016   Procedure: ESOPHAGOGASTRODUODENOSCOPY (EGD) WITH PROPOFOL;  Surgeon: Lucilla Lame, MD;  Location: [place];  Service: Endoscopy;  Laterality: N/A;  Diabetic - oral meds sleep apnea   EYE SURGERY     FRACTURE SURGERY Left    elbow   KNEE SURGERY  1998   plate after fracture, then removed for infection   left elbow surgery     MASTOIDECTOMY  8/08   Dr Idelle Crouch   PACEMAKER IMPLANT N/A 09/02/2021   Procedure: PACEMAKER IMPLANT;  Surgeon: Vickie Epley, MD;  Location: [place] [facility];  Service: Cardiovascular;  Laterality: N/A;   RHINOPLASTY  5/10   and septoplasty   SUBACROMIAL DECOMPRESSION Right 2005   Arthroscopic (for rotator cuff and biceps tendon ruptures)   TEAR DUCT PROBING  11/13   Dr Vickki Muff   TOTAL KNEE ARTHROPLASTY Left 09/01/2014   Procedure: TOTAL KNEE ARTHROPLASTY;  Surgeon: Dereck Leep, MD;  Location: [facility];  Service: Orthopedics;  Laterality: Left;   TRIGGER FINGER RELEASE Left 02/25/2015   Procedure: LEFT LONG TRIGGER RELEASE;  Surgeon: Dereck Leep, MD;  Location: [facility];  Service: Orthopedics;  Laterality: Left;   VENOUS ABLATION      Allergies  Allergen Reactions   Lovenox [Enoxaparin] Hives   Proscar [Finasteride] Other (See  Comments)    Upper body and arm weakness   Latex Rash    Has trouble with BANDAIDS that have been left on for more than 24 hours. Prefers paper tape.   Nickel Rash    Localized rash   Percocet [Oxycodone-Acetaminophen] Rash   Tape Rash and Other (See Comments)    Sensitivity     Outpatient Encounter Medications as of 03/09/2022  Medication Sig   fluticasone (FLONASE) 50 MCG/ACT nasal spray Place into both nostrils.   triamterene-hydrochlorothiazide (MAXZIDE-25) 37.5-25 MG tablet Take 1 tablet by mouth daily.   acetaminophen (TYLENOL) 500 MG tablet Take 1,000 mg by mouth every 8 (eight) hours as needed.   apixaban (ELIQUIS) 2.5 MG TABS tablet Take 2.5 mg by mouth 2 (two) times daily.   APPLE CIDER VINEGAR PO Take 1 capsule by mouth at bedtime.   atorvastatin (LIPITOR) 80 MG tablet Take 1 tablet (80 mg total) by mouth daily.   cetirizine (ZYRTEC) 5 MG tablet Take 5 mg by mouth daily.   Cranberry 250 MG TABS Take 1 tablet by mouth 2 (two) times daily.   Cyanocobalamin (B-12 PO) Take 1,000 mg by mouth daily.   docusate sodium (COLACE) 100 MG capsule Take 100 mg by mouth 2 (two) times daily.   finasteride (PROSCAR) 5 MG tablet Take 1 tablet (5 mg total) by mouth daily.   guaiFENesin (TUSSIN PO) Take 10 mLs by mouth every 4 (four) hours as needed.   lidocaine (LIDODERM) 5 % Place 1 patch onto the skin daily. Remove & Discard patch within 12 hours or as directed by MD   Magnesium Oxide 400 MG CAPS Take 1 capsule (400 mg total) by mouth daily.   metFORMIN (GLUCOPHAGE) 500 MG tablet TAKE 1 TABLET BY MOUTH  TWICE DAILY WITH MEALS   methocarbamol (ROBAXIN) 500 MG tablet Take 1 tablet (500 mg total) by mouth every 8 (eight) hours as needed for muscle spasms.   mirtazapine (REMERON) 7.5 MG tablet Take 1 tablet (7.5 mg total) by mouth at bedtime.   Polyethyl Glycol-Propyl Glycol (SYSTANE ULTRA OP) Place 1 drop into both eyes daily as needed (dry eyes).   polyethylene glycol (MIRALAX) 17 g packet Take  17 g by mouth 2 (two) times daily.   Protein (PROSOURCE PLUS PO) Give one every shift.   senna (SENOKOT) 8.6 MG TABS tablet Take 1 tablet (8.6 mg total) by mouth daily.   sertraline (ZOLOFT) 50 MG tablet Take 50 mg by mouth daily.   silver sulfADIAZINE (SILVADENE) 1 % cream Apply 1 Application topically daily.   tamsulosin (FLOMAX) 0.4 MG CAPS capsule Take 0.8 mg by mouth at bedtime.   No facility-administered encounter medications on file as of 03/09/2022.    Review of Systems  Immunization History  Administered Date(s) Administered  Fluad Quad(high Dose 65+) 09/14/2018   H1N1 12/13/2007   Influenza Split 09/11/2010   Influenza Whole 09/12/2011   Influenza, High Dose Seasonal PF 09/20/2012, 09/07/2017   Influenza, Seasonal, Injecte, Preservative Fre 09/08/2007, 09/26/2008, 10/02/2014, 09/18/2015   Influenza,inj,Quad PF,6+ Mos 09/19/2013, 09/21/2015   Influenza-Unspecified 09/07/2017, 09/11/2019, 09/03/2020, 10/19/2021   Moderna Sars-Covid-2 Vaccination 01/18/2019, 02/15/2019, 10/17/2020, 06/01/2021   Pneumococcal Conjugate-13 09/19/2013   Pneumococcal Polysaccharide-23 09/26/2003, 06/28/2016   Td 08/04/1998   Tdap 06/04/2010, 08/15/2021   Tetanus 08/04/1998   Unspecified SARS-COV-2 Vaccination 06/01/2021   Zoster, Live 01/16/2004   Pertinent  Health Maintenance Due  Topic Date Due   HEMOGLOBIN A1C  08/25/2021   OPHTHALMOLOGY EXAM  04/17/2022   FOOT EXAM  08/20/2022   INFLUENZA VACCINE  Completed      12/05/2021    8:00 AM 12/05/2021   10:38 PM 12/06/2021    7:05 AM 12/06/2021    7:40 PM 12/07/2021    8:45 AM  Fall Risk  (RETIRED) Patient Fall Risk Level High fall risk Moderate fall risk High fall risk High fall risk High fall risk   Functional Status Survey:    Vitals:   03/09/22 1111  BP: 124/72  Pulse: 68  Resp: 18  Temp: 97.7 F (36.5 C)  SpO2: 95%   There is no height or weight on file to calculate BMI. Physical Exam Constitutional:      Appearance:  Normal appearance.  HENT:     Mouth/Throat:     Comments: Green mucous adhered to the left oropharynx Cardiovascular:     Rate and Rhythm: Normal rate.     Pulses: Normal pulses.  Pulmonary:     Effort: Pulmonary effort is normal.  Skin:    General: Skin is warm and dry.  Neurological:     Mental Status: He is alert.  Psychiatric:        Mood and Affect: Mood normal.     Labs reviewed: Recent Labs    09/02/21 0311 09/03/21 0457 09/16/21 0949 12/04/21 0118 12/04/21 0500 12/06/21 0348 02/08/22 1438 02/14/22 0000  NA 138 137  --    < > 136 132* 134* 138  K 3.6 3.9  --    < > 3.6 3.7 3.9 3.5  CL 109 105  --    < > 105 103 96* 103  CO2 25 25  --    < > '25 22 24 '$ 31*  GLUCOSE 125* 112*  --    < > 164* 166* 281*  --   BUN 26* 22  --    < > 25* 26* 50* 37*  CREATININE 0.91 0.92  --    < > 0.79 0.79 1.20 1.0  CALCIUM 10.0 10.5*  --    < > 9.8 9.8 10.0 9.8  MG 1.5* 1.6* 1.6  --   --   --   --   --    < > = values in this interval not displayed.   Recent Labs    02/08/22 1438  AST 42*  ALT 25  ALKPHOS 77  BILITOT 1.0  PROT 6.6  ALBUMIN 2.9*   Recent Labs    12/04/21 0118 12/04/21 0500 12/06/21 0348 12/07/21 0623 02/08/22 1438 02/14/22 0000 02/20/22 0000  WBC 12.3*   < > 11.0* 10.6* 11.9* 5.4 6.4  NEUTROABS 10.2*  --   --   --   --  3,440.00 4,736.00  HGB 12.0*   < > 10.4* 10.7* 12.5* 11.5* 10.7*  HCT  34.8*   < > 30.8* 30.9* 39.0 34* 32*  MCV 92.3   < > 93.1 93.1 94.2  --   --   PLT 174   < > 138* 139* 189 110* 143*   < > = values in this interval not displayed.   Lab Results  Component Value Date   TSH 0.76 12/11/2012   Lab Results  Component Value Date   HGBA1C 7.6 (H) 02/25/2021   Lab Results  Component Value Date   CHOL 131 02/25/2021   HDL 70.00 02/25/2021   LDLCALC 47 02/25/2021   TRIG 67.0 02/25/2021   CHOLHDL 2 02/25/2021    Significant Diagnostic Results in last 30 days:  No results found.  Assessment/Plan Atrial flutter, unspecified  type ([place]) - Plan: Do not attempt resuscitation (DNR)  Sinus drainage  Essential hypertension  Atrial fibrillation, chronic (HCC)  Chronic diastolic heart failure (HCC)  Type 2 diabetes, controlled, with neuropathy (HCC)  Weight loss  Chronic indwelling Foley catheter  Abnormality of urethral meatus Patient's weight has stabilized in the last few weeks. Some increased sinus drainage, will plan to start daily zyrtec for the next 7 days and nasal spray. Discontinue flonase given no relief. Encourage adequate nutrition. Alternate leg for securing foley catheter. Continue remeron and sertraline. Continue prosource plus. Continue proscar 5 mg. Continue metformin 500 mg BID. Continue flomax.    Family/ staff Communication: nursing  Labs/tests ordered:  none

## 2022-03-11 DIAGNOSIS — R2681 Unsteadiness on feet: Secondary | ICD-10-CM | POA: Diagnosis not present

## 2022-03-11 DIAGNOSIS — I442 Atrioventricular block, complete: Secondary | ICD-10-CM | POA: Diagnosis not present

## 2022-03-11 DIAGNOSIS — S42201D Unspecified fracture of upper end of right humerus, subsequent encounter for fracture with routine healing: Secondary | ICD-10-CM | POA: Diagnosis not present

## 2022-03-11 DIAGNOSIS — I495 Sick sinus syndrome: Secondary | ICD-10-CM | POA: Diagnosis not present

## 2022-03-11 DIAGNOSIS — M7022 Olecranon bursitis, left elbow: Secondary | ICD-10-CM | POA: Diagnosis not present

## 2022-03-11 DIAGNOSIS — E1140 Type 2 diabetes mellitus with diabetic neuropathy, unspecified: Secondary | ICD-10-CM | POA: Diagnosis not present

## 2022-03-11 DIAGNOSIS — R278 Other lack of coordination: Secondary | ICD-10-CM | POA: Diagnosis not present

## 2022-03-11 DIAGNOSIS — M6281 Muscle weakness (generalized): Secondary | ICD-10-CM | POA: Diagnosis not present

## 2022-03-11 DIAGNOSIS — S42402D Unspecified fracture of lower end of left humerus, subsequent encounter for fracture with routine healing: Secondary | ICD-10-CM | POA: Diagnosis not present

## 2022-03-11 DIAGNOSIS — M19122 Post-traumatic osteoarthritis, left elbow: Secondary | ICD-10-CM | POA: Diagnosis not present

## 2022-03-11 DIAGNOSIS — D696 Thrombocytopenia, unspecified: Secondary | ICD-10-CM | POA: Diagnosis not present

## 2022-03-11 DIAGNOSIS — J3489 Other specified disorders of nose and nasal sinuses: Secondary | ICD-10-CM

## 2022-03-11 DIAGNOSIS — R2689 Other abnormalities of gait and mobility: Secondary | ICD-10-CM | POA: Diagnosis not present

## 2022-03-11 DIAGNOSIS — Z978 Presence of other specified devices: Secondary | ICD-10-CM | POA: Insufficient documentation

## 2022-03-11 DIAGNOSIS — R262 Difficulty in walking, not elsewhere classified: Secondary | ICD-10-CM | POA: Diagnosis not present

## 2022-03-11 DIAGNOSIS — Z950 Presence of cardiac pacemaker: Secondary | ICD-10-CM | POA: Diagnosis not present

## 2022-03-11 DIAGNOSIS — Q6470 Unspecified congenital malformation of bladder and urethra: Secondary | ICD-10-CM | POA: Insufficient documentation

## 2022-03-11 DIAGNOSIS — Z9181 History of falling: Secondary | ICD-10-CM | POA: Diagnosis not present

## 2022-03-11 HISTORY — DX: Other specified disorders of nose and nasal sinuses: J34.89

## 2022-03-14 ENCOUNTER — Ambulatory Visit (INDEPENDENT_AMBULATORY_CARE_PROVIDER_SITE_OTHER): Payer: Medicare Other

## 2022-03-14 ENCOUNTER — Encounter: Payer: Self-pay | Admitting: Adult Health

## 2022-03-14 ENCOUNTER — Non-Acute Institutional Stay (SKILLED_NURSING_FACILITY): Payer: Medicare Other | Admitting: Adult Health

## 2022-03-14 DIAGNOSIS — I442 Atrioventricular block, complete: Secondary | ICD-10-CM | POA: Diagnosis not present

## 2022-03-14 DIAGNOSIS — E1140 Type 2 diabetes mellitus with diabetic neuropathy, unspecified: Secondary | ICD-10-CM

## 2022-03-14 DIAGNOSIS — M25473 Effusion, unspecified ankle: Secondary | ICD-10-CM

## 2022-03-14 DIAGNOSIS — I482 Chronic atrial fibrillation, unspecified: Secondary | ICD-10-CM | POA: Diagnosis not present

## 2022-03-14 DIAGNOSIS — N401 Enlarged prostate with lower urinary tract symptoms: Secondary | ICD-10-CM | POA: Diagnosis not present

## 2022-03-14 DIAGNOSIS — N138 Other obstructive and reflux uropathy: Secondary | ICD-10-CM | POA: Diagnosis not present

## 2022-03-14 NOTE — Progress Notes (Signed)
Location:  [facility] [place].  Nursing Home Room Number: [place] of Service:  [phone]) Provider:  Armond Hang  PCP: Dewayne Shorter, MD  Patient Care Team: Dewayne Shorter, MD as PCP - General (Family Medicine) Minna Merritts, MD as PCP - Cardiology (Cardiology) Minna Merritts, MD as Consulting Physician (Cardiology)  Extended Emergency Contact Information Primary Emergency Contact: [place], [facility]) Mobile Phone: [phone] Relation: Son  Code Status:  DNR Goals of care: Advanced Directive information    03/14/2022    3:43 PM  Advanced Directives  Does Patient Have a Medical Advance Directive? Yes  Type of Paramedic of [place];Out of facility DNR (pink MOST or yellow form);Living will  Does patient want to make changes to medical advance directive? No - Patient declined  Copy of [place] in Chart? Yes - validated most recent copy scanned in chart (See row information)     Chief Complaint  Patient presents with   Acute Visit    Right Ankle Edema    HPI:  Pt is a 89 y.o. male seen today for an acute visit for right ankle edema. He is a long-term care resident of [facility]. He has a PMH of BPH, asthma, diabetes mellitus and hypertension. He was seen  today and noted to have right ankle 2+ edema with no erythema. He denies trauma to RLE. He denies having urinary retention. He takes Flomax and Finasteride for BPH. He takes Eliquis for atrial fibrillation. He takes Metformin for diabetes mellitus. CBGs ranging from 118 to 183.   Past Medical History:  Diagnosis Date   Asthma    Atrial flutter ([place]) 2007   Band keratopathy    Benign prostatic hypertrophy    Chronic ear infection    Chronic kidney disease    Chronic prostatitis    Dental bridge present    permanent - upper   Diabetes mellitus    Elbow stiffness, left    s/p fracture many yrs ago.  arm does not straighten   GERD  (gastroesophageal reflux disease)    laryngeal involvement   Hyperlipidemia    Hypertension    Obstructive sleep apnea    CPAP-9   Osteoarthrosis, localized, primary, knee    post-traumatic   Prostatitis    Stroke ([place])    TIA   Tachy-brady syndrome ([place])    UTI (urinary tract infection)    Past Surgical History:  Procedure Laterality Date   APPENDECTOMY     BELPHAROPTOSIS REPAIR     Dr [name]---didn't resolve weepy eye and eyelid drooping   CARDIOVERSION  04/13/2010   CATARACT EXTRACTION, BILATERAL  2009   Chest pain  8/12   Stress test benign   ESOPHAGEAL DILATION  05/26/2016   Procedure: ESOPHAGEAL DILATION;  Surgeon: Lucilla Lame, MD;  Location: [place];  Service: Endoscopy;;   ESOPHAGOGASTRODUODENOSCOPY (EGD) WITH PROPOFOL N/A 05/26/2016   Procedure: ESOPHAGOGASTRODUODENOSCOPY (EGD) WITH PROPOFOL;  Surgeon: Lucilla Lame, MD;  Location: [facility];  Service: Endoscopy;  Laterality: N/A;  Diabetic - oral meds sleep apnea   EYE SURGERY     FRACTURE SURGERY Left    elbow   KNEE SURGERY  1998   plate after fracture, then removed for infection   left elbow surgery     MASTOIDECTOMY  8/08   Dr Idelle Crouch   PACEMAKER IMPLANT N/A 09/02/2021   Procedure: PACEMAKER IMPLANT;  Surgeon: Vickie Epley, MD;  Location: [facility];  Service: Cardiovascular;  Laterality: N/A;   RHINOPLASTY  5/10   and septoplasty   SUBACROMIAL DECOMPRESSION Right 2005   Arthroscopic (for rotator cuff and biceps tendon ruptures)   TEAR DUCT PROBING  11/13   Dr Vickki Muff   TOTAL KNEE ARTHROPLASTY Left 09/01/2014   Procedure: TOTAL KNEE ARTHROPLASTY;  Surgeon: Dereck Leep, MD;  Location: [facility];  Service: Orthopedics;  Laterality: Left;   TRIGGER FINGER RELEASE Left 02/25/2015   Procedure: LEFT LONG TRIGGER RELEASE;  Surgeon: Dereck Leep, MD;  Location: [facility];  Service: Orthopedics;  Laterality: Left;   VENOUS ABLATION      Allergies  Allergen Reactions   Lovenox  [Enoxaparin] Hives   Proscar [Finasteride] Other (See Comments)    Upper body and arm weakness   Latex Rash    Has trouble with BANDAIDS that have been left on for more than 24 hours. Prefers paper tape.   Nickel Rash    Localized rash   Percocet [Oxycodone-Acetaminophen] Rash   Tape Rash and Other (See Comments)    Sensitivity     Outpatient Encounter Medications as of 03/14/2022  Medication Sig   acetaminophen (TYLENOL) 500 MG tablet Take 1,000 mg by mouth every 8 (eight) hours as needed.   apixaban (ELIQUIS) 2.5 MG TABS tablet Take 2.5 mg by mouth 2 (two) times daily.   APPLE CIDER VINEGAR PO Take 1 capsule by mouth at bedtime.   atorvastatin (LIPITOR) 80 MG tablet Take 1 tablet (80 mg total) by mouth daily.   cetirizine (ZYRTEC) 5 MG tablet Take 5 mg by mouth daily.   Cranberry 250 MG TABS Take 1 tablet by mouth 2 (two) times daily.   Cyanocobalamin (B-12 PO) Take 1,000 mg by mouth daily.   docusate sodium (COLACE) 100 MG capsule Take 100 mg by mouth 2 (two) times daily.   finasteride (PROSCAR) 5 MG tablet Take 1 tablet (5 mg total) by mouth daily.   guaiFENesin (TUSSIN PO) Take 10 mLs by mouth every 4 (four) hours as needed.   lactose free nutrition (BOOST) LIQD Take 237 mLs by mouth 3 (three) times daily between meals.   lidocaine (LIDODERM) 5 % Place 1 patch onto the skin daily. Remove & Discard patch within 12 hours or as directed by MD   Magnesium Oxide 400 MG CAPS Take 1 capsule (400 mg total) by mouth daily.   metFORMIN (GLUCOPHAGE) 500 MG tablet TAKE 1 TABLET BY MOUTH  TWICE DAILY WITH MEALS   mirtazapine (REMERON) 7.5 MG tablet Take 1 tablet (7.5 mg total) by mouth at bedtime.   Polyethyl Glycol-Propyl Glycol (SYSTANE ULTRA OP) Place 1 drop into both eyes daily as needed (dry eyes).   polyethylene glycol (MIRALAX / GLYCOLAX) 17 g packet Take 17 g by mouth. Daily every Monday, Wednesday and Friday.   senna (SENOKOT) 8.6 MG TABS tablet Take 1 tablet (8.6 mg total) by mouth  daily.   sertraline (ZOLOFT) 50 MG tablet Take 50 mg by mouth daily.   silver sulfADIAZINE (SILVADENE) 1 % cream Apply 1 Application topically daily.   sodium chloride (OCEAN) 0.65 % SOLN nasal spray Place 2 sprays into both nostrils 2 (two) times daily as needed for congestion.   tamsulosin (FLOMAX) 0.4 MG CAPS capsule Take 0.8 mg by mouth at bedtime.   traMADol (ULTRAM) 50 MG tablet Take 50 mg by mouth every 6 (six) hours as needed.   [DISCONTINUED] polyethylene glycol (MIRALAX) 17 g packet Take 17 g by mouth 2 (  two) times daily. (Patient taking differently: Take 17 g by mouth. Every Monday,Wednesday and Friday)   [DISCONTINUED] fluticasone (FLONASE) 50 MCG/ACT nasal spray Place into both nostrils.   [DISCONTINUED] Protein (PROSOURCE PLUS PO) Give one every shift.   No facility-administered encounter medications on file as of 03/14/2022.    Review of Systems  Constitutional:  Negative for activity change, appetite change and fever.  HENT:  Negative for sore throat.   Eyes: Negative.   Cardiovascular:  Negative for chest pain and leg swelling.  Gastrointestinal:  Negative for abdominal distention, diarrhea and vomiting.  Genitourinary:  Negative for dysuria, frequency and urgency.  Skin:  Negative for color change.  Neurological:  Negative for dizziness and headaches.  Psychiatric/Behavioral:  Negative for behavioral problems and sleep disturbance. The patient is not nervous/anxious.     Immunization History  Administered Date(s) Administered   Fluad Quad(high Dose 65+) 09/14/2018   H1N1 12/13/2007   Influenza Split 09/11/2010   Influenza Whole 09/12/2011   Influenza, High Dose Seasonal PF 09/20/2012, 09/07/2017   Influenza, Seasonal, Injecte, Preservative Fre 09/08/2007, 09/26/2008, 10/02/2014, 09/18/2015   Influenza,inj,Quad PF,6+ Mos 09/19/2013, 09/21/2015   Influenza-Unspecified 09/07/2017, 09/11/2019, 09/03/2020, 10/19/2021   Moderna Sars-Covid-2 Vaccination 01/18/2019,  02/15/2019, 10/17/2020, 06/01/2021   Pneumococcal Conjugate-13 09/19/2013   Pneumococcal Polysaccharide-23 09/26/2003, 06/28/2016   Td 08/04/1998   Tdap 06/04/2010, 08/15/2021   Tetanus 08/04/1998   Unspecified SARS-COV-2 Vaccination 06/01/2021   Zoster, Live 01/16/2004   Pertinent  Health Maintenance Due  Topic Date Due   HEMOGLOBIN A1C  08/25/2021   OPHTHALMOLOGY EXAM  04/17/2022   FOOT EXAM  08/20/2022   INFLUENZA VACCINE  Completed      12/05/2021    8:00 AM 12/05/2021   10:38 PM 12/06/2021    7:05 AM 12/06/2021    7:40 PM 12/07/2021    8:45 AM  Fall Risk  (RETIRED) Patient Fall Risk Level High fall risk Moderate fall risk High fall risk High fall risk High fall risk   Functional Status Survey:    Vitals:   03/14/22 1531  BP: 117/73  Pulse: 86  Resp: 18  Temp: 98.5 F (36.9 C)  SpO2: 91%  Weight: 172 lb (78 kg)  Height: 5\' 10"  (1.778 m)   Body mass index is 24.68 kg/m. Physical Exam Constitutional:      Appearance: Normal appearance.  HENT:     Head: Normocephalic and atraumatic.     Mouth/Throat:     Mouth: Mucous membranes are moist.  Eyes:     Conjunctiva/sclera: Conjunctivae normal.  Cardiovascular:     Rate and Rhythm: Normal rate and regular rhythm.     Pulses: Normal pulses.     Heart sounds: Normal heart sounds.     Comments: Has left chest pacemaker Pulmonary:     Effort: Pulmonary effort is normal.     Breath sounds: Normal breath sounds.  Abdominal:     General: Bowel sounds are normal.     Palpations: Abdomen is soft.  Musculoskeletal:        General: Swelling present. Normal range of motion.     Cervical back: Normal range of motion.     Right lower leg: Edema present.     Comments: RLE 2+edema  Skin:    General: Skin is warm and dry.  Neurological:     Mental Status: He is alert and oriented to person, place, and time. Mental status is at baseline.  Psychiatric:        Mood and  Affect: Mood normal.        Behavior: Behavior normal.      Labs reviewed: Recent Labs    09/02/21 0311 09/03/21 0457 09/16/21 0949 12/04/21 0118 12/04/21 0500 12/06/21 0348 02/08/22 1438 02/14/22 0000  NA 138 137  --    < > 136 132* 134* 138  K 3.6 3.9  --    < > 3.6 3.7 3.9 3.5  CL 109 105  --    < > 105 103 96* 103  CO2 25 25  --    < > 25 22 24  31*  GLUCOSE 125* 112*  --    < > 164* 166* 281*  --   BUN 26* 22  --    < > 25* 26* 50* 37*  CREATININE 0.91 0.92  --    < > 0.79 0.79 1.20 1.0  CALCIUM 10.0 10.5*  --    < > 9.8 9.8 10.0 9.8  MG 1.5* 1.6* 1.6  --   --   --   --   --    < > = values in this interval not displayed.   Recent Labs    02/08/22 1438  AST 42*  ALT 25  ALKPHOS 77  BILITOT 1.0  PROT 6.6  ALBUMIN 2.9*   Recent Labs    12/06/21 0348 12/07/21 0623 02/08/22 1438 02/14/22 0000 02/20/22 0000 02/24/22 0000  WBC 11.0* 10.6* 11.9* 5.4 6.4 6.9  NEUTROABS  --   --   --  3,440.00 4,736.00 4,720.00  HGB 10.4* 10.7* 12.5* 11.5* 10.7* 10.7*  HCT 30.8* 30.9* 39.0 34* 32* 33*  MCV 93.1 93.1 94.2  --   --   --   PLT 138* 139* 189 110* 143* 185   Lab Results  Component Value Date   TSH 0.76 12/11/2012   Lab Results  Component Value Date   HGBA1C 7.6 (H) 02/25/2021   Lab Results  Component Value Date   CHOL 131 02/25/2021   HDL 70.00 02/25/2021   LDLCALC 47 02/25/2021   TRIG 67.0 02/25/2021   CHOLHDL 2 02/25/2021    Significant Diagnostic Results in last 30 days:  No results found.  Assessment/Plan  1. Ankle edema -  denies trauma -  encouraged elevation of extremities -  will get x-ray of right ankle to rule out fracture and uric acid to rule out gout - venous ultrasound of RLE to sheck for DVT  2. BPH with obstruction/lower urinary tract symptoms -  continue Flomax and Finasteride  3. Atrial fibrillation, chronic (HCC) -  rate-controlled -  continue Eliquis  4. Type 2 diabetes, controlled, with neuropathy ([place]) Lab Results  Component Value Date   HGBA1C 7.6 (H) 02/25/2021   -   CBGs stable -   continue Metformin   Family/ staff Communication:  Discussed plan of care with resident and charge nurse.  Labs/tests ordered:  uric acid, x-ray of RLE and venous ultrasound of RLE

## 2022-03-15 DIAGNOSIS — Z9181 History of falling: Secondary | ICD-10-CM | POA: Diagnosis not present

## 2022-03-15 DIAGNOSIS — R278 Other lack of coordination: Secondary | ICD-10-CM | POA: Diagnosis not present

## 2022-03-15 DIAGNOSIS — R2681 Unsteadiness on feet: Secondary | ICD-10-CM | POA: Diagnosis not present

## 2022-03-15 DIAGNOSIS — Z950 Presence of cardiac pacemaker: Secondary | ICD-10-CM | POA: Diagnosis not present

## 2022-03-15 DIAGNOSIS — M7022 Olecranon bursitis, left elbow: Secondary | ICD-10-CM | POA: Diagnosis not present

## 2022-03-15 DIAGNOSIS — S42201D Unspecified fracture of upper end of right humerus, subsequent encounter for fracture with routine healing: Secondary | ICD-10-CM | POA: Diagnosis not present

## 2022-03-15 DIAGNOSIS — R262 Difficulty in walking, not elsewhere classified: Secondary | ICD-10-CM | POA: Diagnosis not present

## 2022-03-15 DIAGNOSIS — S42402D Unspecified fracture of lower end of left humerus, subsequent encounter for fracture with routine healing: Secondary | ICD-10-CM | POA: Diagnosis not present

## 2022-03-15 DIAGNOSIS — M19122 Post-traumatic osteoarthritis, left elbow: Secondary | ICD-10-CM | POA: Diagnosis not present

## 2022-03-15 DIAGNOSIS — M79604 Pain in right leg: Secondary | ICD-10-CM | POA: Diagnosis not present

## 2022-03-15 DIAGNOSIS — M6281 Muscle weakness (generalized): Secondary | ICD-10-CM | POA: Diagnosis not present

## 2022-03-15 DIAGNOSIS — I442 Atrioventricular block, complete: Secondary | ICD-10-CM | POA: Diagnosis not present

## 2022-03-15 DIAGNOSIS — D696 Thrombocytopenia, unspecified: Secondary | ICD-10-CM | POA: Diagnosis not present

## 2022-03-15 DIAGNOSIS — I495 Sick sinus syndrome: Secondary | ICD-10-CM | POA: Diagnosis not present

## 2022-03-15 DIAGNOSIS — E1140 Type 2 diabetes mellitus with diabetic neuropathy, unspecified: Secondary | ICD-10-CM | POA: Diagnosis not present

## 2022-03-15 DIAGNOSIS — M25571 Pain in right ankle and joints of right foot: Secondary | ICD-10-CM | POA: Diagnosis not present

## 2022-03-15 DIAGNOSIS — R2689 Other abnormalities of gait and mobility: Secondary | ICD-10-CM | POA: Diagnosis not present

## 2022-03-16 LAB — CUP PACEART REMOTE DEVICE CHECK
Battery Remaining Longevity: 120 mo
Battery Remaining Percentage: 100 %
Brady Statistic RA Percent Paced: 22 %
Brady Statistic RV Percent Paced: 94 %
Date Time Interrogation Session: 20240311000100
Implantable Lead Connection Status: 753985
Implantable Lead Connection Status: 753985
Implantable Lead Implant Date: 20230831
Implantable Lead Implant Date: 20230831
Implantable Lead Location: 753859
Implantable Lead Location: 753860
Implantable Lead Model: 7841
Implantable Lead Model: 7842
Implantable Lead Serial Number: 1220926
Implantable Lead Serial Number: 1322603
Implantable Pulse Generator Implant Date: 20230831
Lead Channel Impedance Value: 490 Ohm
Lead Channel Impedance Value: 511 Ohm
Lead Channel Pacing Threshold Amplitude: 0.5 V
Lead Channel Pacing Threshold Amplitude: 1.1 V
Lead Channel Pacing Threshold Pulse Width: 0.4 ms
Lead Channel Pacing Threshold Pulse Width: 0.4 ms
Lead Channel Setting Pacing Amplitude: 3.5 V
Lead Channel Setting Pacing Amplitude: 3.5 V
Lead Channel Setting Pacing Pulse Width: 0.4 ms
Lead Channel Setting Sensing Sensitivity: 3.5 mV
Pulse Gen Serial Number: 613820
Zone Setting Status: 755011

## 2022-03-17 ENCOUNTER — Ambulatory Visit (INDEPENDENT_AMBULATORY_CARE_PROVIDER_SITE_OTHER): Payer: Medicare Other | Admitting: Urology

## 2022-03-17 ENCOUNTER — Encounter: Payer: Self-pay | Admitting: Urology

## 2022-03-17 VITALS — BP 102/66 | HR 99

## 2022-03-17 DIAGNOSIS — R338 Other retention of urine: Secondary | ICD-10-CM

## 2022-03-17 DIAGNOSIS — Z466 Encounter for fitting and adjustment of urinary device: Secondary | ICD-10-CM

## 2022-03-17 DIAGNOSIS — E785 Hyperlipidemia, unspecified: Secondary | ICD-10-CM | POA: Diagnosis not present

## 2022-03-17 LAB — CBC AND DIFFERENTIAL
HCT: 29 — AB (ref 41–53)
Hemoglobin: 9.5 — AB (ref 13.5–17.5)
Platelets: 152 10*3/uL (ref 150–400)
WBC: 8.4

## 2022-03-17 LAB — CBC: RBC: 3.07 — AB (ref 3.87–5.11)

## 2022-03-17 MED ORDER — CEPHALEXIN 500 MG PO CAPS: 500.0000 mg | ORAL_CAPSULE | Freq: Two times a day (BID) | ORAL | 0 refills | Status: AC

## 2022-03-17 MED ORDER — CEPHALEXIN 250 MG PO CAPS
500.0000 mg | ORAL_CAPSULE | Freq: Once | ORAL | Status: AC
  Administered 2022-03-17: 500 mg via ORAL

## 2022-03-17 MED ORDER — TAMSULOSIN HCL 0.4 MG PO CAPS: 0.8000 mg | ORAL_CAPSULE | Freq: Every day | ORAL | 6 refills | Status: AC

## 2022-03-17 MED ORDER — LIDOCAINE HCL URETHRAL/MUCOSAL 2 % EX GEL: 1.0000 | Freq: Once | CUTANEOUS | Status: DC

## 2022-03-17 NOTE — Progress Notes (Signed)
   03/17/2022 9:33 AM   [name] June 22, 1930 [id number]  Reason for visit: Urinary retention  HPI: Extremely comorbid and frail 89 year old male who is wheelchair-bound who was set up by Debroah Loop, PA for a cystoscopy today.  He is unable to even transfer out of the wheelchair, and not a candidate for cystoscopy, let alone an outlet procedure at this time.  He resides in a facility, and history is difficult to obtain with his dementia.  On extensive chart review, it sounds like he developed retention in late January 2023, however this was likely associated with fecal impaction based on the CT scan from 02/08/2022 showing a small prostate measuring only 38g, Foley in place, no hydronephrosis, severe fecal impaction.  I personally viewed and interpreted those images.  His Foley was then removed in our clinic on 02/09/2022, and PVR was borderline at 252ml, and using shared decision making they opted to continue trial of void without Foley replacement.  Foley was reportedly replaced at his facility for unclear reasons, and sounds like that was more related to [place] as opposed to any true retention, and urine output with catheter placement was not recorded.  He is very bothered by the catheter.  He is on maximal medical therapy with Flomax and finasteride  With his comorbidities, frailty, and minimal ability to even transfer out of the wheelchair, he is not a candidate for cystoscopy today.  High suspicion that his urinary retention is more related to decreased mobility and fecal impaction than true BPH.  Regardless of cystoscopy findings, I do not think he would be a candidate for an outlet procedure with his frailty.  We opted to defer cystoscopy today, and Foley catheter was removed for a trial of void.  Keflex x 5 days to sterilize the urine, continue Flomax and finasteride.  Instructions were given to his facility to not replace the catheter and was bladder scan greater than 400 mL  with lower abdominal discomfort or inability to urinate.  I think we can tolerate borderline PVRs and him if he is not having any discomfort or infections.  Foley removed today, Keflex 500 mg twice daily x 5 days to sterilize urine Continue maximal medical therapy with Flomax and finasteride RTC 4 weeks PVR-> if unable to urinate or Foley is replaced, need to consider chronic Foley changes or suprapubic tube with IR   Billey Co, [place] [address], [place], [place] [address] [phone]

## 2022-03-17 NOTE — Progress Notes (Signed)
Catheter Removal  Patient is present today for a catheter removal. 47m of water was drained from the balloon. A 18FR foley cath was removed from the bladder, no complications were noted. Patient tolerated well.  Performed by: OGordy Clement CMA  Follow up/ Additional notes: RTC in 1 month for PVR & follow up

## 2022-03-18 ENCOUNTER — Non-Acute Institutional Stay (SKILLED_NURSING_FACILITY): Payer: Medicare Other | Admitting: Student

## 2022-03-18 ENCOUNTER — Encounter: Payer: Self-pay | Admitting: Student

## 2022-03-18 DIAGNOSIS — R338 Other retention of urine: Secondary | ICD-10-CM | POA: Diagnosis not present

## 2022-03-18 DIAGNOSIS — R54 Age-related physical debility: Secondary | ICD-10-CM | POA: Diagnosis not present

## 2022-03-18 DIAGNOSIS — S42402D Unspecified fracture of lower end of left humerus, subsequent encounter for fracture with routine healing: Secondary | ICD-10-CM | POA: Diagnosis not present

## 2022-03-18 DIAGNOSIS — R278 Other lack of coordination: Secondary | ICD-10-CM | POA: Diagnosis not present

## 2022-03-18 DIAGNOSIS — N3001 Acute cystitis with hematuria: Secondary | ICD-10-CM

## 2022-03-18 DIAGNOSIS — N401 Enlarged prostate with lower urinary tract symptoms: Secondary | ICD-10-CM

## 2022-03-18 DIAGNOSIS — M7022 Olecranon bursitis, left elbow: Secondary | ICD-10-CM | POA: Diagnosis not present

## 2022-03-18 DIAGNOSIS — R2689 Other abnormalities of gait and mobility: Secondary | ICD-10-CM | POA: Diagnosis not present

## 2022-03-18 DIAGNOSIS — S42201D Unspecified fracture of upper end of right humerus, subsequent encounter for fracture with routine healing: Secondary | ICD-10-CM | POA: Diagnosis not present

## 2022-03-18 DIAGNOSIS — Z9181 History of falling: Secondary | ICD-10-CM | POA: Diagnosis not present

## 2022-03-18 DIAGNOSIS — E1140 Type 2 diabetes mellitus with diabetic neuropathy, unspecified: Secondary | ICD-10-CM | POA: Diagnosis not present

## 2022-03-18 DIAGNOSIS — I442 Atrioventricular block, complete: Secondary | ICD-10-CM | POA: Diagnosis not present

## 2022-03-18 DIAGNOSIS — M19122 Post-traumatic osteoarthritis, left elbow: Secondary | ICD-10-CM | POA: Diagnosis not present

## 2022-03-18 DIAGNOSIS — B950 Streptococcus, group A, as the cause of diseases classified elsewhere: Secondary | ICD-10-CM | POA: Diagnosis not present

## 2022-03-18 DIAGNOSIS — I495 Sick sinus syndrome: Secondary | ICD-10-CM | POA: Diagnosis not present

## 2022-03-18 DIAGNOSIS — R262 Difficulty in walking, not elsewhere classified: Secondary | ICD-10-CM | POA: Diagnosis not present

## 2022-03-18 DIAGNOSIS — D696 Thrombocytopenia, unspecified: Secondary | ICD-10-CM | POA: Diagnosis not present

## 2022-03-18 DIAGNOSIS — M6281 Muscle weakness (generalized): Secondary | ICD-10-CM | POA: Diagnosis not present

## 2022-03-18 DIAGNOSIS — R2681 Unsteadiness on feet: Secondary | ICD-10-CM | POA: Diagnosis not present

## 2022-03-18 DIAGNOSIS — Z950 Presence of cardiac pacemaker: Secondary | ICD-10-CM | POA: Diagnosis not present

## 2022-03-18 NOTE — Progress Notes (Unsigned)
Location:  Other [place].  Nursing Home Room Number: [place] of Service:  SNF [phone]) Provider:  Dewayne Shorter, MD  Patient Care Team: Dewayne Shorter, MD as PCP - General (Family Medicine) Minna Merritts, MD as PCP - Cardiology (Cardiology) Minna Merritts, MD as Consulting Physician (Cardiology)  Extended Emergency Contact Information Primary Emergency Contact: [place], [place] [facility]) Mobile Phone: [phone] Relation: Son  Code Status:  DNR Goals of care: Advanced Directive information    03/18/2022   10:29 AM  Advanced Directives  Does Patient Have a Medical Advance Directive? Yes  Type of Paramedic of [place];Out of facility DNR (pink MOST or yellow form);Living will  Does patient want to make changes to medical advance directive? No - Patient declined  Copy of [place] in Chart? Yes - validated most recent copy scanned in chart (See row information)     Chief Complaint  Patient presents with   Acute Visit    Urinary Problems.     HPI:  Pt is a 89 y.o. male seen today for an acute visit for follow up after specialist visit. Patient had foley catheter removed yesterday and had urinary retention of 500 mL overnight.   He denies pain at this time.   He has had daily bowel movements TID which have been soft (but not watery). He is eating better. He is hoping to get stronger and work with physical therapy again now that he has more motivation. Discussed concern of the significant muscle loss he has had in the last few months and importance of adequate nutrition.    Past Medical History:  Diagnosis Date   Asthma    Atrial flutter ([place]) 2007   Band keratopathy    Benign prostatic hypertrophy    Chronic ear infection    Chronic kidney disease    Chronic prostatitis    Dental bridge present    permanent - upper   Diabetes mellitus    Elbow stiffness, left    s/p fracture many yrs ago.  arm  does not straighten   GERD (gastroesophageal reflux disease)    laryngeal involvement   Hyperlipidemia    Hypertension    Obstructive sleep apnea    CPAP-9   Osteoarthrosis, localized, primary, knee    post-traumatic   Prostatitis    Stroke ([place])    TIA   Tachy-brady syndrome ([place])    UTI (urinary tract infection)    Past Surgical History:  Procedure Laterality Date   APPENDECTOMY     BELPHAROPTOSIS REPAIR     Dr [name]---didn't resolve weepy eye and eyelid drooping   CARDIOVERSION  04/13/2010   CATARACT EXTRACTION, BILATERAL  2009   Chest pain  8/12   Stress test benign   ESOPHAGEAL DILATION  05/26/2016   Procedure: ESOPHAGEAL DILATION;  Surgeon: Lucilla Lame, MD;  Location: [place];  Service: Endoscopy;;   ESOPHAGOGASTRODUODENOSCOPY (EGD) WITH PROPOFOL N/A 05/26/2016   Procedure: ESOPHAGOGASTRODUODENOSCOPY (EGD) WITH PROPOFOL;  Surgeon: Lucilla Lame, MD;  Location: [place];  Service: Endoscopy;  Laterality: N/A;  Diabetic - oral meds sleep apnea   EYE SURGERY     FRACTURE SURGERY Left    elbow   KNEE SURGERY  1998   plate after fracture, then removed for infection   left elbow surgery     MASTOIDECTOMY  8/08   Dr Idelle Crouch   PACEMAKER IMPLANT N/A 09/02/2021   Procedure: PACEMAKER IMPLANT;  Surgeon: Vickie Epley,  MD;  Location: [place] [facility];  Service: Cardiovascular;  Laterality: N/A;   RHINOPLASTY  5/10   and septoplasty   SUBACROMIAL DECOMPRESSION Right 2005   Arthroscopic (for rotator cuff and biceps tendon ruptures)   TEAR DUCT PROBING  11/13   Dr Vickki Muff   TOTAL KNEE ARTHROPLASTY Left 09/01/2014   Procedure: TOTAL KNEE ARTHROPLASTY;  Surgeon: Dereck Leep, MD;  Location: [facility];  Service: Orthopedics;  Laterality: Left;   TRIGGER FINGER RELEASE Left 02/25/2015   Procedure: LEFT LONG TRIGGER RELEASE;  Surgeon: Dereck Leep, MD;  Location: [facility];  Service: Orthopedics;  Laterality: Left;   VENOUS ABLATION      Allergies   Allergen Reactions   Lovenox [Enoxaparin] Hives   Proscar [Finasteride] Other (See Comments)    Upper body and arm weakness   Latex Rash    Has trouble with BANDAIDS that have been left on for more than 24 hours. Prefers paper tape.   Nickel Rash    Localized rash   Percocet [Oxycodone-Acetaminophen] Rash   Tape Rash and Other (See Comments)    Sensitivity     Outpatient Encounter Medications as of 03/18/2022  Medication Sig   acetaminophen (TYLENOL) 500 MG tablet Take 1,000 mg by mouth every 8 (eight) hours as needed.   apixaban (ELIQUIS) 2.5 MG TABS tablet Take 2.5 mg by mouth 2 (two) times daily.   APPLE CIDER VINEGAR PO Take 1 capsule by mouth at bedtime.   atorvastatin (LIPITOR) 80 MG tablet Take 1 tablet (80 mg total) by mouth daily.   cephALEXin (KEFLEX) 500 MG capsule Take 1 capsule (500 mg total) by mouth 2 (two) times daily for 7 days.   Cranberry 250 MG TABS Take 1 tablet by mouth 2 (two) times daily.   Cyanocobalamin (B-12 PO) Take 1,000 mg by mouth daily.   finasteride (PROSCAR) 5 MG tablet Take 1 tablet (5 mg total) by mouth daily.   guaiFENesin (TUSSIN PO) Take 10 mLs by mouth every 4 (four) hours as needed.   lactose free nutrition (BOOST) LIQD Take 237 mLs by mouth 3 (three) times daily between meals.   lidocaine (LIDODERM) 5 % Place 1 patch onto the skin daily. Remove & Discard patch within 12 hours or as directed by MD   Magnesium Oxide 400 MG CAPS Take 1 capsule (400 mg total) by mouth daily.   metFORMIN (GLUCOPHAGE) 500 MG tablet TAKE 1 TABLET BY MOUTH  TWICE DAILY WITH MEALS   mirtazapine (REMERON) 7.5 MG tablet Take 1 tablet (7.5 mg total) by mouth at bedtime.   Polyethyl Glycol-Propyl Glycol (SYSTANE ULTRA OP) Place 1 drop into both eyes daily as needed (dry eyes).   polyethylene glycol (MIRALAX / GLYCOLAX) 17 g packet Take 17 g by mouth. Daily every Monday, Wednesday and Friday.   senna (SENOKOT) 8.6 MG TABS tablet Take 1 tablet (8.6 mg total) by mouth daily.    sertraline (ZOLOFT) 50 MG tablet Take 50 mg by mouth daily.   silver sulfADIAZINE (SILVADENE) 1 % cream Apply 1 Application topically daily.   sodium chloride (OCEAN) 0.65 % SOLN nasal spray Place 2 sprays into both nostrils 2 (two) times daily as needed for congestion.   tamsulosin (FLOMAX) 0.4 MG CAPS capsule Take 2 capsules (0.8 mg total) by mouth at bedtime.   traMADol (ULTRAM) 50 MG tablet Take 50 mg by mouth every 6 (six) hours as needed.   [DISCONTINUED] cetirizine (ZYRTEC) 5 MG tablet Take 5 mg by mouth daily.   [  DISCONTINUED] docusate sodium (COLACE) 100 MG capsule Take 100 mg by mouth 2 (two) times daily.   No facility-administered encounter medications on file as of 03/18/2022.    Review of Systems  Immunization History  Administered Date(s) Administered   Fluad Quad(high Dose 65+) 09/14/2018   H1N1 12/13/2007   Influenza Split 09/11/2010   Influenza Whole 09/12/2011   Influenza, High Dose Seasonal PF 09/20/2012, 09/07/2017   Influenza, Seasonal, Injecte, Preservative Fre 09/08/2007, 09/26/2008, 10/02/2014, 09/18/2015   Influenza,inj,Quad PF,6+ Mos 09/19/2013, 09/21/2015   Influenza-Unspecified 09/07/2017, 09/11/2019, 09/03/2020, 10/19/2021   Moderna Sars-Covid-2 Vaccination 01/18/2019, 02/15/2019, 10/17/2020, 06/01/2021   Pneumococcal Conjugate-13 09/19/2013   Pneumococcal Polysaccharide-23 09/26/2003, 06/28/2016   Td 08/04/1998   Tdap 06/04/2010, 08/15/2021   Tetanus 08/04/1998   Unspecified SARS-COV-2 Vaccination 06/01/2021   Zoster, Live 01/16/2004   Pertinent  Health Maintenance Due  Topic Date Due   HEMOGLOBIN A1C  08/25/2021   OPHTHALMOLOGY EXAM  04/17/2022   FOOT EXAM  08/20/2022   INFLUENZA VACCINE  Completed      12/05/2021    8:00 AM 12/05/2021   10:38 PM 12/06/2021    7:05 AM 12/06/2021    7:40 PM 12/07/2021    8:45 AM  Fall Risk  (RETIRED) Patient Fall Risk Level High fall risk Moderate fall risk High fall risk High fall risk High fall risk    Functional Status Survey:    Vitals:   03/18/22 1022  BP: 117/73  Pulse: 86  Resp: 18  Temp: 98.5 F (36.9 C)  SpO2: 91%  Weight: 171 lb (77.6 kg)  Height: 5\' 10"  (1.778 m)   Body mass index is 24.54 kg/m. Physical Exam Constitutional:      Comments: Thin, frail, lying in bed.   Cardiovascular:     Rate and Rhythm: Normal rate.     Pulses: Normal pulses.  Pulmonary:     Effort: Pulmonary effort is normal.  Abdominal:     General: Bowel sounds are normal.  Neurological:     Mental Status: He is alert and oriented to person, place, and time.     Labs reviewed: Recent Labs    09/02/21 0311 09/03/21 0457 09/16/21 0949 12/04/21 0118 12/04/21 0500 12/06/21 0348 02/08/22 1438 02/14/22 0000  NA 138 137  --    < > 136 132* 134* 138  K 3.6 3.9  --    < > 3.6 3.7 3.9 3.5  CL 109 105  --    < > 105 103 96* 103  CO2 25 25  --    < > 25 22 24  31*  GLUCOSE 125* 112*  --    < > 164* 166* 281*  --   BUN 26* 22  --    < > 25* 26* 50* 37*  CREATININE 0.91 0.92  --    < > 0.79 0.79 1.20 1.0  CALCIUM 10.0 10.5*  --    < > 9.8 9.8 10.0 9.8  MG 1.5* 1.6* 1.6  --   --   --   --   --    < > = values in this interval not displayed.   Recent Labs    02/08/22 1438  AST 42*  ALT 25  ALKPHOS 77  BILITOT 1.0  PROT 6.6  ALBUMIN 2.9*   Recent Labs    12/06/21 0348 12/07/21 0623 02/08/22 1438 02/08/22 1438 02/14/22 0000 02/20/22 0000 02/24/22 0000 03/17/22 0000  WBC 11.0* 10.6* 11.9*   < > 5.4 6.4 6.9 8.4  NEUTROABS  --   --   --   --  3,440.00 4,736.00 4,720.00  --   HGB 10.4* 10.7* 12.5*  --  11.5* 10.7* 10.7* 9.5*  HCT 30.8* 30.9* 39.0  --  34* 32* 33* 29*  MCV 93.1 93.1 94.2  --   --   --   --   --   PLT 138* 139* 189  --  110* 143* 185 152   < > = values in this interval not displayed.   Lab Results  Component Value Date   TSH 0.76 12/11/2012   Lab Results  Component Value Date   HGBA1C 7.6 (H) 02/25/2021   Lab Results  Component Value Date   CHOL 131  02/25/2021   HDL 70.00 02/25/2021   LDLCALC 47 02/25/2021   TRIG 67.0 02/25/2021   CHOLHDL 2 02/25/2021    Significant Diagnostic Results in last 30 days:  CUP PACEART REMOTE DEVICE CHECK  Result Date: 03/16/2022 Scheduled remote reviewed. Normal device function.  Brief ATR's, hx of PAF, Eliquis Next remote 91 days. LA   Assessment/Plan Urinary retention due to benign prostatic hyperplasia  Acute cystitis with hematuria  Frailty syndrome in geriatric patient Patient to complete 5 days of keflex per recommendations of urologist. Discussed with urologist concern that patient has already had one I&O catheterization. He may need chronic foley vs suprapubic catheter if he continues to have retention. Plan to bladder scan q8 hours. I&O for >350 mL. If he needs >1 more I&O catheterization, RN notified he should have a foley catheter placed. Continue bowel regimen as previously ordered. Reorder physical therapy to aid with strength and gait training.    Family/ staff Communication: nursing  Labs/tests ordered:  none

## 2022-03-21 DIAGNOSIS — M6281 Muscle weakness (generalized): Secondary | ICD-10-CM | POA: Diagnosis not present

## 2022-03-21 DIAGNOSIS — M7022 Olecranon bursitis, left elbow: Secondary | ICD-10-CM | POA: Diagnosis not present

## 2022-03-21 DIAGNOSIS — D696 Thrombocytopenia, unspecified: Secondary | ICD-10-CM | POA: Diagnosis not present

## 2022-03-21 DIAGNOSIS — I442 Atrioventricular block, complete: Secondary | ICD-10-CM | POA: Diagnosis not present

## 2022-03-21 DIAGNOSIS — M19122 Post-traumatic osteoarthritis, left elbow: Secondary | ICD-10-CM | POA: Diagnosis not present

## 2022-03-21 DIAGNOSIS — Z950 Presence of cardiac pacemaker: Secondary | ICD-10-CM | POA: Diagnosis not present

## 2022-03-21 DIAGNOSIS — I495 Sick sinus syndrome: Secondary | ICD-10-CM | POA: Diagnosis not present

## 2022-03-21 DIAGNOSIS — S42402D Unspecified fracture of lower end of left humerus, subsequent encounter for fracture with routine healing: Secondary | ICD-10-CM | POA: Diagnosis not present

## 2022-03-21 DIAGNOSIS — R278 Other lack of coordination: Secondary | ICD-10-CM | POA: Diagnosis not present

## 2022-03-21 DIAGNOSIS — R2681 Unsteadiness on feet: Secondary | ICD-10-CM | POA: Diagnosis not present

## 2022-03-21 DIAGNOSIS — E1140 Type 2 diabetes mellitus with diabetic neuropathy, unspecified: Secondary | ICD-10-CM | POA: Diagnosis not present

## 2022-03-21 DIAGNOSIS — S42201D Unspecified fracture of upper end of right humerus, subsequent encounter for fracture with routine healing: Secondary | ICD-10-CM | POA: Diagnosis not present

## 2022-03-21 DIAGNOSIS — Z9181 History of falling: Secondary | ICD-10-CM | POA: Diagnosis not present

## 2022-03-21 DIAGNOSIS — R262 Difficulty in walking, not elsewhere classified: Secondary | ICD-10-CM | POA: Diagnosis not present

## 2022-03-21 DIAGNOSIS — R2689 Other abnormalities of gait and mobility: Secondary | ICD-10-CM | POA: Diagnosis not present

## 2022-03-22 DIAGNOSIS — B950 Streptococcus, group A, as the cause of diseases classified elsewhere: Secondary | ICD-10-CM | POA: Diagnosis not present

## 2022-03-23 DIAGNOSIS — E1140 Type 2 diabetes mellitus with diabetic neuropathy, unspecified: Secondary | ICD-10-CM | POA: Diagnosis not present

## 2022-03-23 DIAGNOSIS — M19122 Post-traumatic osteoarthritis, left elbow: Secondary | ICD-10-CM | POA: Diagnosis not present

## 2022-03-23 DIAGNOSIS — Z950 Presence of cardiac pacemaker: Secondary | ICD-10-CM | POA: Diagnosis not present

## 2022-03-23 DIAGNOSIS — S42201D Unspecified fracture of upper end of right humerus, subsequent encounter for fracture with routine healing: Secondary | ICD-10-CM | POA: Diagnosis not present

## 2022-03-23 DIAGNOSIS — I495 Sick sinus syndrome: Secondary | ICD-10-CM | POA: Diagnosis not present

## 2022-03-23 DIAGNOSIS — D696 Thrombocytopenia, unspecified: Secondary | ICD-10-CM | POA: Diagnosis not present

## 2022-03-23 DIAGNOSIS — Z9181 History of falling: Secondary | ICD-10-CM | POA: Diagnosis not present

## 2022-03-23 DIAGNOSIS — R2689 Other abnormalities of gait and mobility: Secondary | ICD-10-CM | POA: Diagnosis not present

## 2022-03-23 DIAGNOSIS — S42402D Unspecified fracture of lower end of left humerus, subsequent encounter for fracture with routine healing: Secondary | ICD-10-CM | POA: Diagnosis not present

## 2022-03-23 DIAGNOSIS — R2681 Unsteadiness on feet: Secondary | ICD-10-CM | POA: Diagnosis not present

## 2022-03-23 DIAGNOSIS — M6281 Muscle weakness (generalized): Secondary | ICD-10-CM | POA: Diagnosis not present

## 2022-03-23 DIAGNOSIS — R262 Difficulty in walking, not elsewhere classified: Secondary | ICD-10-CM | POA: Diagnosis not present

## 2022-03-23 DIAGNOSIS — M7022 Olecranon bursitis, left elbow: Secondary | ICD-10-CM | POA: Diagnosis not present

## 2022-03-23 DIAGNOSIS — R278 Other lack of coordination: Secondary | ICD-10-CM | POA: Diagnosis not present

## 2022-03-23 DIAGNOSIS — I442 Atrioventricular block, complete: Secondary | ICD-10-CM | POA: Diagnosis not present

## 2022-03-24 ENCOUNTER — Non-Acute Institutional Stay (SKILLED_NURSING_FACILITY): Payer: Medicare Other | Admitting: Nurse Practitioner

## 2022-03-24 ENCOUNTER — Encounter: Payer: Self-pay | Admitting: Nurse Practitioner

## 2022-03-24 DIAGNOSIS — R5383 Other fatigue: Secondary | ICD-10-CM | POA: Diagnosis not present

## 2022-03-24 DIAGNOSIS — N401 Enlarged prostate with lower urinary tract symptoms: Secondary | ICD-10-CM

## 2022-03-24 DIAGNOSIS — D649 Anemia, unspecified: Secondary | ICD-10-CM | POA: Diagnosis not present

## 2022-03-24 DIAGNOSIS — I442 Atrioventricular block, complete: Secondary | ICD-10-CM

## 2022-03-24 DIAGNOSIS — R338 Other retention of urine: Secondary | ICD-10-CM | POA: Diagnosis not present

## 2022-03-24 DIAGNOSIS — K5900 Constipation, unspecified: Secondary | ICD-10-CM

## 2022-03-24 LAB — CBC AND DIFFERENTIAL
HCT: 30 — AB (ref 41–53)
Hemoglobin: 10 — AB (ref 13.5–17.5)
Platelets: 176 10*3/uL (ref 150–400)
WBC: 9.3

## 2022-03-24 LAB — CBC: RBC: 3.14 — AB (ref 3.87–5.11)

## 2022-03-24 NOTE — Progress Notes (Signed)
Location:  Other [place].  Nursing Home Room Number: [place] of Service:  SNF [phone]) Provider:  Sherrie Mustache, NP  PCP: Dewayne Shorter, MD  Patient Care Team: Dewayne Shorter, MD as PCP - General (Family Medicine) Minna Merritts, MD as PCP - Cardiology (Cardiology) Minna Merritts, MD as Consulting Physician (Cardiology)  Extended Emergency Contact Information Primary Emergency Contact: Michael Colon, [name] [facility]) Mobile Phone: [phone] Relation: Son  Code Status:  DNR Goals of care: Advanced Directive information    03/24/2022   12:10 PM  Advanced Directives  Does Patient Have a Medical Advance Directive? Yes  Type of Paramedic of [place];Out of facility DNR (pink MOST or yellow form);Living will  Does patient want to make changes to medical advance directive? No - Patient declined  Copy of [place] in Chart? Yes - validated most recent copy scanned in chart (See row information)     Chief Complaint  Patient presents with   Acute Visit    Lump on Left upper Chest.     HPI:  Pt is a 89 y.o. male seen today for an acute visit. Pt requested visit to discuss area on left chest. Staff educated patient that this was his pacemaker but he wanted a provider to see him.  He states he felt like the pacer was in a different place and wanted to make sure everything was okay.   Pt also reports feeling of constipation. He has daily BMs and has had 2 today. Staff did a bladder scan and showed 800cc in bladder.     Past Medical History:  Diagnosis Date   Asthma    Atrial flutter ([name]) 2007   Band keratopathy    Benign prostatic hypertrophy    Chronic ear infection    Chronic kidney disease    Chronic prostatitis    Dental bridge present    permanent - upper   Diabetes mellitus    Elbow stiffness, left    s/p fracture many yrs ago.  arm does not straighten   GERD (gastroesophageal reflux disease)     laryngeal involvement   Hyperlipidemia    Hypertension    Obstructive sleep apnea    CPAP-9   Osteoarthrosis, localized, primary, knee    post-traumatic   Prostatitis    Stroke ([name])    TIA   Tachy-brady syndrome ([name])    UTI (urinary tract infection)    Past Surgical History:  Procedure Laterality Date   APPENDECTOMY     BELPHAROPTOSIS REPAIR     Dr [name]---didn't resolve weepy eye and eyelid drooping   CARDIOVERSION  04/13/2010   CATARACT EXTRACTION, BILATERAL  2009   Chest pain  8/12   Stress test benign   ESOPHAGEAL DILATION  05/26/2016   Procedure: ESOPHAGEAL DILATION;  Surgeon: Lucilla Lame, MD;  Location: [place];  Service: Endoscopy;;   ESOPHAGOGASTRODUODENOSCOPY (EGD) WITH PROPOFOL N/A 05/26/2016   Procedure: ESOPHAGOGASTRODUODENOSCOPY (EGD) WITH PROPOFOL;  Surgeon: Lucilla Lame, MD;  Location: [place];  Service: Endoscopy;  Laterality: N/A;  Diabetic - oral meds sleep apnea   EYE SURGERY     FRACTURE SURGERY Left    elbow   KNEE SURGERY  1998   plate after fracture, then removed for infection   left elbow surgery     MASTOIDECTOMY  8/08   Dr Idelle Crouch   PACEMAKER IMPLANT N/A 09/02/2021   Procedure: PACEMAKER IMPLANT;  Surgeon: Vickie Epley, MD;  Location: [facility]  INVASIVE [facility];  Service: Cardiovascular;  Laterality: N/A;   RHINOPLASTY  5/10   and septoplasty   SUBACROMIAL DECOMPRESSION Right 2005   Arthroscopic (for rotator cuff and biceps tendon ruptures)   TEAR DUCT PROBING  11/13   Dr Vickki Muff   TOTAL KNEE ARTHROPLASTY Left 09/01/2014   Procedure: TOTAL KNEE ARTHROPLASTY;  Surgeon: Dereck Leep, MD;  Location: [facility];  Service: Orthopedics;  Laterality: Left;   TRIGGER FINGER RELEASE Left 02/25/2015   Procedure: LEFT LONG TRIGGER RELEASE;  Surgeon: Dereck Leep, MD;  Location: [facility];  Service: Orthopedics;  Laterality: Left;   VENOUS ABLATION      Allergies  Allergen Reactions   Lovenox [Enoxaparin] Hives   Proscar  [Finasteride] Other (See Comments)    Upper body and arm weakness   Latex Rash    Has trouble with BANDAIDS that have been left on for more than 24 hours. Prefers paper tape.   Nickel Rash    Localized rash   Percocet [Oxycodone-Acetaminophen] Rash   Tape Rash and Other (See Comments)    Sensitivity     Outpatient Encounter Medications as of 03/24/2022  Medication Sig   acetaminophen (TYLENOL) 500 MG tablet Take 1,000 mg by mouth every 8 (eight) hours as needed.   apixaban (ELIQUIS) 2.5 MG TABS tablet Take 2.5 mg by mouth 2 (two) times daily.   APPLE CIDER VINEGAR PO Take 1 capsule by mouth at bedtime.   atorvastatin (LIPITOR) 80 MG tablet Take 1 tablet (80 mg total) by mouth daily.   cephALEXin (KEFLEX) 500 MG capsule Take 1 capsule (500 mg total) by mouth 2 (two) times daily for 7 days.   Cranberry 250 MG TABS Take 1 tablet by mouth 2 (two) times daily.   Cyanocobalamin (B-12 PO) Take 1,000 mg by mouth daily.   finasteride (PROSCAR) 5 MG tablet Take 1 tablet (5 mg total) by mouth daily.   guaiFENesin (TUSSIN PO) Take 10 mLs by mouth every 4 (four) hours as needed.   lactose free nutrition (BOOST) LIQD Take 237 mLs by mouth 3 (three) times daily between meals.   lidocaine (LIDODERM) 5 % Place 1 patch onto the skin daily. Remove & Discard patch within 12 hours or as directed by MD   Magnesium Oxide 400 MG CAPS Take 1 capsule (400 mg total) by mouth daily.   metFORMIN (GLUCOPHAGE) 500 MG tablet TAKE 1 TABLET BY MOUTH  TWICE DAILY WITH MEALS   mirtazapine (REMERON) 7.5 MG tablet Take 1 tablet (7.5 mg total) by mouth at bedtime.   Polyethyl Glycol-Propyl Glycol (SYSTANE ULTRA OP) Place 1 drop into both eyes daily as needed (dry eyes).   polyethylene glycol (MIRALAX / GLYCOLAX) 17 g packet Take 17 g by mouth. Daily every Monday, Wednesday and Friday.   senna (SENOKOT) 8.6 MG TABS tablet Take 1 tablet (8.6 mg total) by mouth daily.   sertraline (ZOLOFT) 50 MG tablet Take 50 mg by mouth daily.    silver sulfADIAZINE (SILVADENE) 1 % cream Apply 1 Application topically daily.   sodium chloride (OCEAN) 0.65 % SOLN nasal spray Place 2 sprays into both nostrils 2 (two) times daily as needed for congestion.   tamsulosin (FLOMAX) 0.4 MG CAPS capsule Take 2 capsules (0.8 mg total) by mouth at bedtime.   traMADol (ULTRAM) 50 MG tablet Take 50 mg by mouth every 6 (six) hours as needed.   No facility-administered encounter medications on file as of 03/24/2022.    Review of Systems  Constitutional:  Negative for activity change, appetite change, fatigue and unexpected weight change.  HENT:  Negative for congestion and hearing loss.   Eyes: Negative.   Respiratory:  Negative for cough and shortness of breath.   Cardiovascular:  Negative for chest pain, palpitations and leg swelling.  Gastrointestinal:  Positive for constipation. Negative for abdominal pain and diarrhea.  Genitourinary:  Negative for difficulty urinating and dysuria.  Musculoskeletal:  Negative for arthralgias and myalgias.  Skin:  Negative for color change and wound.  Neurological:  Negative for dizziness and weakness.  Psychiatric/Behavioral:  Negative for agitation, behavioral problems and confusion.     Immunization History  Administered Date(s) Administered   Fluad Quad(high Dose 65+) 09/14/2018   H1N1 12/13/2007   Influenza Split 09/11/2010   Influenza Whole 09/12/2011   Influenza, High Dose Seasonal PF 09/20/2012, 09/07/2017   Influenza, Seasonal, Injecte, Preservative Fre 09/08/2007, 09/26/2008, 10/02/2014, 09/18/2015   Influenza,inj,Quad PF,6+ Mos 09/19/2013, 09/21/2015   Influenza-Unspecified 09/07/2017, 09/11/2019, 09/03/2020, 10/19/2021   Moderna Sars-Covid-2 Vaccination 01/18/2019, 02/15/2019, 10/17/2020, 06/01/2021   Pneumococcal Conjugate-13 09/19/2013   Pneumococcal Polysaccharide-23 09/26/2003, 06/28/2016   Td 08/04/1998   Tdap 06/04/2010, 08/15/2021   Tetanus 08/04/1998   Unspecified SARS-COV-2  Vaccination 06/01/2021   Zoster, Live 01/16/2004   Pertinent  Health Maintenance Due  Topic Date Due   HEMOGLOBIN A1C  08/25/2021   OPHTHALMOLOGY EXAM  04/17/2022   FOOT EXAM  08/20/2022   INFLUENZA VACCINE  Completed      12/05/2021    8:00 AM 12/05/2021   10:38 PM 12/06/2021    7:05 AM 12/06/2021    7:40 PM 12/07/2021    8:45 AM  Fall Risk  (RETIRED) Patient Fall Risk Level High fall risk Moderate fall risk High fall risk High fall risk High fall risk   Functional Status Survey:    Vitals:   03/24/22 1204  BP: 118/77  Pulse: 86  Resp: 18  Temp: 98.5 F (36.9 C)  SpO2: 91%  Weight: 165 lb 12.8 oz (75.2 kg)  Height: 5\' 10"  (1.778 m)   Body mass index is 23.79 kg/m. Physical Exam  Labs reviewed: Recent Labs    09/02/21 0311 09/03/21 0457 09/16/21 0949 12/04/21 0118 12/04/21 0500 12/06/21 0348 02/08/22 1438 02/14/22 0000  NA 138 137  --    < > 136 132* 134* 138  K 3.6 3.9  --    < > 3.6 3.7 3.9 3.5  CL 109 105  --    < > 105 103 96* 103  CO2 25 25  --    < > 25 22 24  31*  GLUCOSE 125* 112*  --    < > 164* 166* 281*  --   BUN 26* 22  --    < > 25* 26* 50* 37*  CREATININE 0.91 0.92  --    < > 0.79 0.79 1.20 1.0  CALCIUM 10.0 10.5*  --    < > 9.8 9.8 10.0 9.8  MG 1.5* 1.6* 1.6  --   --   --   --   --    < > = values in this interval not displayed.   Recent Labs    02/08/22 1438  AST 42*  ALT 25  ALKPHOS 77  BILITOT 1.0  PROT 6.6  ALBUMIN 2.9*   Recent Labs    12/06/21 0348 12/07/21 0623 02/08/22 1438 02/08/22 1438 02/14/22 0000 02/20/22 0000 02/24/22 0000 03/17/22 0000  WBC 11.0* 10.6* 11.9*   < > 5.4  6.4 6.9 8.4  NEUTROABS  --   --   --   --  3,440.00 4,736.00 4,720.00  --   HGB 10.4* 10.7* 12.5*  --  11.5* 10.7* 10.7* 9.5*  HCT 30.8* 30.9* 39.0  --  34* 32* 33* 29*  MCV 93.1 93.1 94.2  --   --   --   --   --   PLT 138* 139* 189  --  110* 143* 185 152   < > = values in this interval not displayed.   Lab Results  Component Value Date    TSH 0.76 12/11/2012   Lab Results  Component Value Date   HGBA1C 7.6 (H) 02/25/2021   Lab Results  Component Value Date   CHOL 131 02/25/2021   HDL 70.00 02/25/2021   LDLCALC 47 02/25/2021   TRIG 67.0 02/25/2021   CHOLHDL 2 02/25/2021    Significant Diagnostic Results in last 30 days:  CUP PACEART REMOTE DEVICE CHECK  Result Date: 03/16/2022 Scheduled remote reviewed. Normal device function.  Brief ATR's, hx of PAF, Eliquis Next remote 91 days. [place]   Assessment/Plan 1. Complete heart block (HCC) -s/p pacer, has follow up with cardiology and routine pacer checks scheduled.  Pt given reassurance  2. Urinary retention due to benign prostatic hyperplasia -staff repositioning patient and encouraged to urinate, will follow up and notify if unable.   3. Constipation, unspecified constipation type -well controlled on current regimen.   Michael Colon. [place], [facility] [phone]

## 2022-03-25 DIAGNOSIS — M19122 Post-traumatic osteoarthritis, left elbow: Secondary | ICD-10-CM | POA: Diagnosis not present

## 2022-03-25 DIAGNOSIS — I495 Sick sinus syndrome: Secondary | ICD-10-CM | POA: Diagnosis not present

## 2022-03-25 DIAGNOSIS — S42201D Unspecified fracture of upper end of right humerus, subsequent encounter for fracture with routine healing: Secondary | ICD-10-CM | POA: Diagnosis not present

## 2022-03-25 DIAGNOSIS — M7022 Olecranon bursitis, left elbow: Secondary | ICD-10-CM | POA: Diagnosis not present

## 2022-03-25 DIAGNOSIS — R2681 Unsteadiness on feet: Secondary | ICD-10-CM | POA: Diagnosis not present

## 2022-03-25 DIAGNOSIS — M6281 Muscle weakness (generalized): Secondary | ICD-10-CM | POA: Diagnosis not present

## 2022-03-25 DIAGNOSIS — I442 Atrioventricular block, complete: Secondary | ICD-10-CM | POA: Diagnosis not present

## 2022-03-25 DIAGNOSIS — R262 Difficulty in walking, not elsewhere classified: Secondary | ICD-10-CM | POA: Diagnosis not present

## 2022-03-25 DIAGNOSIS — R2689 Other abnormalities of gait and mobility: Secondary | ICD-10-CM | POA: Diagnosis not present

## 2022-03-25 DIAGNOSIS — Z9181 History of falling: Secondary | ICD-10-CM | POA: Diagnosis not present

## 2022-03-25 DIAGNOSIS — E1140 Type 2 diabetes mellitus with diabetic neuropathy, unspecified: Secondary | ICD-10-CM | POA: Diagnosis not present

## 2022-03-25 DIAGNOSIS — D696 Thrombocytopenia, unspecified: Secondary | ICD-10-CM | POA: Diagnosis not present

## 2022-03-25 DIAGNOSIS — R278 Other lack of coordination: Secondary | ICD-10-CM | POA: Diagnosis not present

## 2022-03-25 DIAGNOSIS — S42402D Unspecified fracture of lower end of left humerus, subsequent encounter for fracture with routine healing: Secondary | ICD-10-CM | POA: Diagnosis not present

## 2022-03-25 DIAGNOSIS — Z950 Presence of cardiac pacemaker: Secondary | ICD-10-CM | POA: Diagnosis not present

## 2022-04-04 DIAGNOSIS — Z9181 History of falling: Secondary | ICD-10-CM | POA: Diagnosis not present

## 2022-04-04 DIAGNOSIS — I495 Sick sinus syndrome: Secondary | ICD-10-CM | POA: Diagnosis not present

## 2022-04-04 DIAGNOSIS — E1140 Type 2 diabetes mellitus with diabetic neuropathy, unspecified: Secondary | ICD-10-CM | POA: Diagnosis not present

## 2022-04-04 DIAGNOSIS — S42402D Unspecified fracture of lower end of left humerus, subsequent encounter for fracture with routine healing: Secondary | ICD-10-CM | POA: Diagnosis not present

## 2022-04-04 DIAGNOSIS — M6281 Muscle weakness (generalized): Secondary | ICD-10-CM | POA: Diagnosis not present

## 2022-04-04 DIAGNOSIS — S42201D Unspecified fracture of upper end of right humerus, subsequent encounter for fracture with routine healing: Secondary | ICD-10-CM | POA: Diagnosis not present

## 2022-04-04 DIAGNOSIS — M7022 Olecranon bursitis, left elbow: Secondary | ICD-10-CM | POA: Diagnosis not present

## 2022-04-04 DIAGNOSIS — Z950 Presence of cardiac pacemaker: Secondary | ICD-10-CM | POA: Diagnosis not present

## 2022-04-04 DIAGNOSIS — R262 Difficulty in walking, not elsewhere classified: Secondary | ICD-10-CM | POA: Diagnosis not present

## 2022-04-04 DIAGNOSIS — R278 Other lack of coordination: Secondary | ICD-10-CM | POA: Diagnosis not present

## 2022-04-04 DIAGNOSIS — D696 Thrombocytopenia, unspecified: Secondary | ICD-10-CM | POA: Diagnosis not present

## 2022-04-04 DIAGNOSIS — I442 Atrioventricular block, complete: Secondary | ICD-10-CM | POA: Diagnosis not present

## 2022-04-04 DIAGNOSIS — R2689 Other abnormalities of gait and mobility: Secondary | ICD-10-CM | POA: Diagnosis not present

## 2022-04-04 DIAGNOSIS — R2681 Unsteadiness on feet: Secondary | ICD-10-CM | POA: Diagnosis not present

## 2022-04-04 DIAGNOSIS — M19122 Post-traumatic osteoarthritis, left elbow: Secondary | ICD-10-CM | POA: Diagnosis not present

## 2022-04-08 DIAGNOSIS — M6281 Muscle weakness (generalized): Secondary | ICD-10-CM | POA: Diagnosis not present

## 2022-04-08 DIAGNOSIS — I442 Atrioventricular block, complete: Secondary | ICD-10-CM | POA: Diagnosis not present

## 2022-04-08 DIAGNOSIS — M7022 Olecranon bursitis, left elbow: Secondary | ICD-10-CM | POA: Diagnosis not present

## 2022-04-08 DIAGNOSIS — D696 Thrombocytopenia, unspecified: Secondary | ICD-10-CM | POA: Diagnosis not present

## 2022-04-08 DIAGNOSIS — R262 Difficulty in walking, not elsewhere classified: Secondary | ICD-10-CM | POA: Diagnosis not present

## 2022-04-08 DIAGNOSIS — M19122 Post-traumatic osteoarthritis, left elbow: Secondary | ICD-10-CM | POA: Diagnosis not present

## 2022-04-08 DIAGNOSIS — R2681 Unsteadiness on feet: Secondary | ICD-10-CM | POA: Diagnosis not present

## 2022-04-08 DIAGNOSIS — R2689 Other abnormalities of gait and mobility: Secondary | ICD-10-CM | POA: Diagnosis not present

## 2022-04-08 DIAGNOSIS — R278 Other lack of coordination: Secondary | ICD-10-CM | POA: Diagnosis not present

## 2022-04-08 DIAGNOSIS — Z950 Presence of cardiac pacemaker: Secondary | ICD-10-CM | POA: Diagnosis not present

## 2022-04-08 DIAGNOSIS — S42402D Unspecified fracture of lower end of left humerus, subsequent encounter for fracture with routine healing: Secondary | ICD-10-CM | POA: Diagnosis not present

## 2022-04-08 DIAGNOSIS — I495 Sick sinus syndrome: Secondary | ICD-10-CM | POA: Diagnosis not present

## 2022-04-08 DIAGNOSIS — S42201D Unspecified fracture of upper end of right humerus, subsequent encounter for fracture with routine healing: Secondary | ICD-10-CM | POA: Diagnosis not present

## 2022-04-08 DIAGNOSIS — Z9181 History of falling: Secondary | ICD-10-CM | POA: Diagnosis not present

## 2022-04-08 DIAGNOSIS — E1140 Type 2 diabetes mellitus with diabetic neuropathy, unspecified: Secondary | ICD-10-CM | POA: Diagnosis not present

## 2022-04-09 DIAGNOSIS — S42402D Unspecified fracture of lower end of left humerus, subsequent encounter for fracture with routine healing: Secondary | ICD-10-CM | POA: Diagnosis not present

## 2022-04-09 DIAGNOSIS — R262 Difficulty in walking, not elsewhere classified: Secondary | ICD-10-CM | POA: Diagnosis not present

## 2022-04-09 DIAGNOSIS — M7022 Olecranon bursitis, left elbow: Secondary | ICD-10-CM | POA: Diagnosis not present

## 2022-04-09 DIAGNOSIS — R2689 Other abnormalities of gait and mobility: Secondary | ICD-10-CM | POA: Diagnosis not present

## 2022-04-09 DIAGNOSIS — I442 Atrioventricular block, complete: Secondary | ICD-10-CM | POA: Diagnosis not present

## 2022-04-09 DIAGNOSIS — M6281 Muscle weakness (generalized): Secondary | ICD-10-CM | POA: Diagnosis not present

## 2022-04-09 DIAGNOSIS — Z950 Presence of cardiac pacemaker: Secondary | ICD-10-CM | POA: Diagnosis not present

## 2022-04-09 DIAGNOSIS — R2681 Unsteadiness on feet: Secondary | ICD-10-CM | POA: Diagnosis not present

## 2022-04-09 DIAGNOSIS — D696 Thrombocytopenia, unspecified: Secondary | ICD-10-CM | POA: Diagnosis not present

## 2022-04-09 DIAGNOSIS — M19122 Post-traumatic osteoarthritis, left elbow: Secondary | ICD-10-CM | POA: Diagnosis not present

## 2022-04-09 DIAGNOSIS — E1140 Type 2 diabetes mellitus with diabetic neuropathy, unspecified: Secondary | ICD-10-CM | POA: Diagnosis not present

## 2022-04-09 DIAGNOSIS — S42201D Unspecified fracture of upper end of right humerus, subsequent encounter for fracture with routine healing: Secondary | ICD-10-CM | POA: Diagnosis not present

## 2022-04-09 DIAGNOSIS — Z9181 History of falling: Secondary | ICD-10-CM | POA: Diagnosis not present

## 2022-04-09 DIAGNOSIS — I495 Sick sinus syndrome: Secondary | ICD-10-CM | POA: Diagnosis not present

## 2022-04-09 DIAGNOSIS — R278 Other lack of coordination: Secondary | ICD-10-CM | POA: Diagnosis not present

## 2022-04-11 ENCOUNTER — Non-Acute Institutional Stay (SKILLED_NURSING_FACILITY): Payer: Medicare Other | Admitting: Student

## 2022-04-11 ENCOUNTER — Encounter: Payer: Self-pay | Admitting: Student

## 2022-04-11 DIAGNOSIS — I495 Sick sinus syndrome: Secondary | ICD-10-CM | POA: Diagnosis not present

## 2022-04-11 DIAGNOSIS — R278 Other lack of coordination: Secondary | ICD-10-CM | POA: Diagnosis not present

## 2022-04-11 DIAGNOSIS — E1140 Type 2 diabetes mellitus with diabetic neuropathy, unspecified: Secondary | ICD-10-CM | POA: Diagnosis not present

## 2022-04-11 DIAGNOSIS — R2689 Other abnormalities of gait and mobility: Secondary | ICD-10-CM | POA: Diagnosis not present

## 2022-04-11 DIAGNOSIS — Z950 Presence of cardiac pacemaker: Secondary | ICD-10-CM | POA: Diagnosis not present

## 2022-04-11 DIAGNOSIS — I442 Atrioventricular block, complete: Secondary | ICD-10-CM | POA: Diagnosis not present

## 2022-04-11 DIAGNOSIS — Z9181 History of falling: Secondary | ICD-10-CM | POA: Diagnosis not present

## 2022-04-11 DIAGNOSIS — M6281 Muscle weakness (generalized): Secondary | ICD-10-CM | POA: Diagnosis not present

## 2022-04-11 DIAGNOSIS — R2681 Unsteadiness on feet: Secondary | ICD-10-CM | POA: Diagnosis not present

## 2022-04-11 DIAGNOSIS — M19122 Post-traumatic osteoarthritis, left elbow: Secondary | ICD-10-CM | POA: Diagnosis not present

## 2022-04-11 DIAGNOSIS — B029 Zoster without complications: Secondary | ICD-10-CM | POA: Diagnosis not present

## 2022-04-11 DIAGNOSIS — R262 Difficulty in walking, not elsewhere classified: Secondary | ICD-10-CM | POA: Diagnosis not present

## 2022-04-11 DIAGNOSIS — S42402D Unspecified fracture of lower end of left humerus, subsequent encounter for fracture with routine healing: Secondary | ICD-10-CM | POA: Diagnosis not present

## 2022-04-11 DIAGNOSIS — D696 Thrombocytopenia, unspecified: Secondary | ICD-10-CM | POA: Diagnosis not present

## 2022-04-11 DIAGNOSIS — S42201D Unspecified fracture of upper end of right humerus, subsequent encounter for fracture with routine healing: Secondary | ICD-10-CM | POA: Diagnosis not present

## 2022-04-11 DIAGNOSIS — M7022 Olecranon bursitis, left elbow: Secondary | ICD-10-CM | POA: Diagnosis not present

## 2022-04-11 NOTE — Progress Notes (Unsigned)
Location:  Other [place].  Nursing Home Room Number: [facility] [address] Place of Service:  SNF [phone]) Provider:  [name], MD  Patient Care Team: [name], MD as PCP - General (Family Medicine) Antonieta Iba, MD as PCP - Cardiology (Cardiology) Antonieta Iba, MD as Consulting Physician (Cardiology)  Extended Emergency Contact Information Primary Emergency Contact: Venus, Moriarity [facility]) Mobile Phone: [phone] Relation: Son  Code Status:  DNR Goals of care: Advanced Directive information    04/11/2022    9:39 AM  Advanced Directives  Does Patient Have a Medical Advance Directive? Yes  Type of Estate agent of [place];Out of facility DNR (pink MOST or yellow form);Living will  Does patient want to make changes to medical advance directive? No - Patient declined  Copy of Healthcare Power of Attorney in Chart? Yes - validated most recent copy scanned in chart (See row information)     Chief Complaint  Patient presents with   Acute Visit    Rash     HPI:  Pt is a 89 y.o. male seen today for an acute visit for new rash around his ear. He states it feels like when he had zoster on his face previously.   He has some crusting areas and a couple of new spots.    Past Medical History:  Diagnosis Date   Asthma    Atrial flutter 2007   Band keratopathy    Benign prostatic hypertrophy    Chronic ear infection    Chronic kidney disease    Chronic prostatitis    Dental bridge present    permanent - upper   Diabetes mellitus    Elbow stiffness, left    s/p fracture many yrs ago.  arm does not straighten   GERD (gastroesophageal reflux disease)    laryngeal involvement   Hyperlipidemia    Hypertension    Obstructive sleep apnea    CPAP-9   Osteoarthrosis, localized, primary, knee    post-traumatic   Prostatitis    Stroke    TIA   Tachy-brady syndrome    UTI (urinary tract infection)    Past Surgical History:   Procedure Laterality Date   APPENDECTOMY     BELPHAROPTOSIS REPAIR     Dr [name]---didn't resolve weepy eye and eyelid drooping   CARDIOVERSION  04/13/2010   CATARACT EXTRACTION, BILATERAL  2009   Chest pain  8/12   Stress test benign   ESOPHAGEAL DILATION  05/26/2016   Procedure: ESOPHAGEAL DILATION;  Surgeon: [name], MD;  Location: [facility];  Service: Endoscopy;;   ESOPHAGOGASTRODUODENOSCOPY (EGD) WITH PROPOFOL N/A 05/26/2016   Procedure: ESOPHAGOGASTRODUODENOSCOPY (EGD) WITH PROPOFOL;  Surgeon: [name], MD;  Location: [facility];  Service: Endoscopy;  Laterality: N/A;  Diabetic - oral meds sleep apnea   EYE SURGERY     FRACTURE SURGERY Left    elbow   KNEE SURGERY  1998   plate after fracture, then removed for infection   left elbow surgery     MASTOIDECTOMY  8/08   Dr [name]   PACEMAKER IMPLANT N/A 09/02/2021   Procedure: PACEMAKER IMPLANT;  Surgeon: [name], MD;  Location: [facility] INVASIVE CV LAB;  Service: Cardiovascular;  Laterality: N/A;   RHINOPLASTY  5/10   and septoplasty   SUBACROMIAL DECOMPRESSION Right 2005   Arthroscopic (for rotator cuff and biceps tendon ruptures)   TEAR DUCT PROBING  11/13   Dr [name]   TOTAL KNEE ARTHROPLASTY Left 09/01/2014  Procedure: TOTAL KNEE ARTHROPLASTY;  Surgeon: [name], MD;  Location: [facility] ORS;  Service: Orthopedics;  Laterality: Left;   TRIGGER FINGER RELEASE Left 02/25/2015   Procedure: LEFT LONG TRIGGER RELEASE;  Surgeon: [name], MD;  Location: [facility] ORS;  Service: Orthopedics;  Laterality: Left;   VENOUS ABLATION      Allergies  Allergen Reactions   Lovenox [Enoxaparin] Hives   Proscar [Finasteride] Other (See Comments)    Upper body and arm weakness   Latex Rash    Has trouble with BANDAIDS that have been left on for more than 24 hours. Prefers paper tape.   Nickel Rash    Localized rash   Percocet [Oxycodone-Acetaminophen] Rash   Tape Rash and Other (See Comments)     Sensitivity     Outpatient Encounter Medications as of 04/11/2022  Medication Sig   acetaminophen (TYLENOL) 500 MG tablet Take 1,000 mg by mouth every 8 (eight) hours as needed.   apixaban (ELIQUIS) 2.5 MG TABS tablet Take 2.5 mg by mouth 2 (two) times daily.   APPLE CIDER VINEGAR PO Take 1 capsule by mouth at bedtime.   atorvastatin (LIPITOR) 80 MG tablet Take 1 tablet (80 mg total) by mouth daily.   Cranberry 250 MG TABS Take 1 tablet by mouth 2 (two) times daily.   Cyanocobalamin (B-12 PO) Take 1,000 mg by mouth daily.   finasteride (PROSCAR) 5 MG tablet Take 1 tablet (5 mg total) by mouth daily.   guaiFENesin (TUSSIN PO) Take 10 mLs by mouth every 4 (four) hours as needed.   lactose free nutrition (BOOST) LIQD Take 237 mLs by mouth 3 (three) times daily between meals.   lidocaine (LIDODERM) 5 % Place 1 patch onto the skin daily. Remove & Discard patch within 12 hours or as directed by MD   Magnesium Oxide 400 MG CAPS Take 1 capsule (400 mg total) by mouth daily.   metFORMIN (GLUCOPHAGE) 500 MG tablet TAKE 1 TABLET BY MOUTH  TWICE DAILY WITH MEALS   mirtazapine (REMERON) 7.5 MG tablet Take 1 tablet (7.5 mg total) by mouth at bedtime.   Polyethyl Glycol-Propyl Glycol (SYSTANE ULTRA OP) Place 1 drop into both eyes daily as needed (dry eyes).   polyethylene glycol (MIRALAX / GLYCOLAX) 17 g packet Take 17 g by mouth. Daily every Monday, Wednesday and Friday.   senna (SENOKOT) 8.6 MG TABS tablet Take 1 tablet (8.6 mg total) by mouth daily.   sertraline (ZOLOFT) 50 MG tablet Take 50 mg by mouth daily.   silver sulfADIAZINE (SILVADENE) 1 % cream Apply 1 Application topically daily.   sodium chloride (OCEAN) 0.65 % SOLN nasal spray Place 2 sprays into both nostrils 2 (two) times daily as needed for congestion.   tamsulosin (FLOMAX) 0.4 MG CAPS capsule Take 2 capsules (0.8 mg total) by mouth at bedtime.   [DISCONTINUED] traMADol (ULTRAM) 50 MG tablet Take 50 mg by mouth every 6 (six) hours as  needed.   No facility-administered encounter medications on file as of 04/11/2022.    Review of Systems  Immunization History  Administered Date(s) Administered   Fluad Quad(high Dose 65+) 09/14/2018   H1N1 12/13/2007   Influenza Split 09/11/2010   Influenza Whole 09/12/2011   Influenza, High Dose Seasonal PF 09/20/2012, 09/07/2017   Influenza, Seasonal, Injecte, Preservative Fre 09/08/2007, 09/26/2008, 10/02/2014, 09/18/2015   Influenza,inj,Quad PF,6+ Mos 09/19/2013, 09/21/2015   Influenza-Unspecified 09/07/2017, 09/11/2019, 09/03/2020, 10/19/2021   Moderna Sars-Covid-2 Vaccination 01/18/2019, 02/15/2019, 10/17/2020, 06/01/2021   Pneumococcal Conjugate-13 09/19/2013  Pneumococcal Polysaccharide-23 09/26/2003, 06/28/2016   Td 08/04/1998   Tdap 06/04/2010, 08/15/2021   Tetanus 08/04/1998   Unspecified SARS-COV-2 Vaccination 06/01/2021   Zoster, Live 01/16/2004   Pertinent  Health Maintenance Due  Topic Date Due   HEMOGLOBIN A1C  08/25/2021   OPHTHALMOLOGY EXAM  04/17/2022   INFLUENZA VACCINE  08/04/2022   FOOT EXAM  08/20/2022      12/05/2021    8:00 AM 12/05/2021   10:38 PM 12/06/2021    7:05 AM 12/06/2021    7:40 PM 12/07/2021    8:45 AM  Fall Risk  (RETIRED) Patient Fall Risk Level High fall risk Moderate fall risk High fall risk High fall risk High fall risk   Functional Status Survey:    Vitals:   04/11/22 0930  BP: 118/73  Pulse: 98  Resp: 20  Temp: 98.4 F (36.9 C)  SpO2: 93%  Weight: 173 lb (78.5 kg)  Height: 5\' 10"  (1.778 m)   Body mass index is 24.82 kg/m. Physical Exam Skin:    Comments: 4 small crusted papules. 1 pustule of the right ear  Neurological:     Mental Status: He is alert.     Labs reviewed: Recent Labs    09/02/21 0311 09/03/21 0457 09/16/21 0949 12/04/21 0118 12/04/21 0500 12/06/21 0348 02/08/22 1438 02/14/22 0000  NA 138 137  --    < > 136 132* 134* 138  K 3.6 3.9  --    < > 3.6 3.7 3.9 3.5  CL 109 105  --    < > 105  103 96* 103  CO2 25 25  --    < > 25 22 24  31*  GLUCOSE 125* 112*  --    < > 164* 166* 281*  --   BUN 26* 22  --    < > 25* 26* 50* 37*  CREATININE 0.91 0.92  --    < > 0.79 0.79 1.20 1.0  CALCIUM 10.0 10.5*  --    < > 9.8 9.8 10.0 9.8  MG 1.5* 1.6* 1.6  --   --   --   --   --    < > = values in this interval not displayed.   Recent Labs    02/08/22 1438  AST 42*  ALT 25  ALKPHOS 77  BILITOT 1.0  PROT 6.6  ALBUMIN 2.9*   Recent Labs    12/06/21 0348 12/07/21 0623 02/08/22 1438 02/08/22 1438 02/14/22 0000 02/20/22 0000 02/24/22 0000 03/17/22 0000 03/24/22 0000  WBC 11.0* 10.6* 11.9*   < > 5.4 6.4 6.9 8.4 9.3  NEUTROABS  --   --   --   --  3,440.00 4,736.00 4,720.00  --   --   HGB 10.4* 10.7* 12.5*  --  11.5* 10.7* 10.7* 9.5* 10.0*  HCT 30.8* 30.9* 39.0  --  34* 32* 33* 29* 30*  MCV 93.1 93.1 94.2  --   --   --   --   --   --   PLT 138* 139* 189  --  110* 143* 185 152 176   < > = values in this interval not displayed.   Lab Results  Component Value Date   TSH 0.76 12/11/2012   Lab Results  Component Value Date   HGBA1C 7.6 (H) 02/25/2021   Lab Results  Component Value Date   CHOL 131 02/25/2021   HDL 70.00 02/25/2021   LDLCALC 47 02/25/2021   TRIG 67.0 02/25/2021  CHOLHDL 2 02/25/2021    Significant Diagnostic Results in last 30 days:  CUP PACEART REMOTE DEVICE CHECK  Result Date: 03/16/2022 Scheduled remote reviewed. Normal device function.  Brief ATR's, hx of PAF, Eliquis Next remote 91 days. LA   Assessment/Plan Herpes zoster without complication Patient with history of zoster. Now has lesions on right ear. Due to concern for ear involvement will start acyclovir 800 mg 5x daily for 7 days. Advised to keep covered and minimize exposure to other people.    Family/ staff Communication: nursing  Labs/tests ordered:  none

## 2022-04-12 DIAGNOSIS — Z950 Presence of cardiac pacemaker: Secondary | ICD-10-CM | POA: Diagnosis not present

## 2022-04-12 DIAGNOSIS — S42201D Unspecified fracture of upper end of right humerus, subsequent encounter for fracture with routine healing: Secondary | ICD-10-CM | POA: Diagnosis not present

## 2022-04-12 DIAGNOSIS — R278 Other lack of coordination: Secondary | ICD-10-CM | POA: Diagnosis not present

## 2022-04-12 DIAGNOSIS — M19122 Post-traumatic osteoarthritis, left elbow: Secondary | ICD-10-CM | POA: Diagnosis not present

## 2022-04-12 DIAGNOSIS — E1140 Type 2 diabetes mellitus with diabetic neuropathy, unspecified: Secondary | ICD-10-CM | POA: Diagnosis not present

## 2022-04-12 DIAGNOSIS — R2681 Unsteadiness on feet: Secondary | ICD-10-CM | POA: Diagnosis not present

## 2022-04-12 DIAGNOSIS — S42402D Unspecified fracture of lower end of left humerus, subsequent encounter for fracture with routine healing: Secondary | ICD-10-CM | POA: Diagnosis not present

## 2022-04-12 DIAGNOSIS — Z9181 History of falling: Secondary | ICD-10-CM | POA: Diagnosis not present

## 2022-04-12 DIAGNOSIS — M6281 Muscle weakness (generalized): Secondary | ICD-10-CM | POA: Diagnosis not present

## 2022-04-12 DIAGNOSIS — R262 Difficulty in walking, not elsewhere classified: Secondary | ICD-10-CM | POA: Diagnosis not present

## 2022-04-12 DIAGNOSIS — M7022 Olecranon bursitis, left elbow: Secondary | ICD-10-CM | POA: Diagnosis not present

## 2022-04-12 DIAGNOSIS — R2689 Other abnormalities of gait and mobility: Secondary | ICD-10-CM | POA: Diagnosis not present

## 2022-04-12 DIAGNOSIS — D696 Thrombocytopenia, unspecified: Secondary | ICD-10-CM | POA: Diagnosis not present

## 2022-04-12 DIAGNOSIS — I442 Atrioventricular block, complete: Secondary | ICD-10-CM | POA: Diagnosis not present

## 2022-04-12 DIAGNOSIS — I495 Sick sinus syndrome: Secondary | ICD-10-CM | POA: Diagnosis not present

## 2022-04-13 DIAGNOSIS — S42201D Unspecified fracture of upper end of right humerus, subsequent encounter for fracture with routine healing: Secondary | ICD-10-CM | POA: Diagnosis not present

## 2022-04-13 DIAGNOSIS — I495 Sick sinus syndrome: Secondary | ICD-10-CM | POA: Diagnosis not present

## 2022-04-13 DIAGNOSIS — D696 Thrombocytopenia, unspecified: Secondary | ICD-10-CM | POA: Diagnosis not present

## 2022-04-13 DIAGNOSIS — E1140 Type 2 diabetes mellitus with diabetic neuropathy, unspecified: Secondary | ICD-10-CM | POA: Diagnosis not present

## 2022-04-13 DIAGNOSIS — Z9181 History of falling: Secondary | ICD-10-CM | POA: Diagnosis not present

## 2022-04-13 DIAGNOSIS — S42402D Unspecified fracture of lower end of left humerus, subsequent encounter for fracture with routine healing: Secondary | ICD-10-CM | POA: Diagnosis not present

## 2022-04-13 DIAGNOSIS — I442 Atrioventricular block, complete: Secondary | ICD-10-CM | POA: Diagnosis not present

## 2022-04-13 DIAGNOSIS — R262 Difficulty in walking, not elsewhere classified: Secondary | ICD-10-CM | POA: Diagnosis not present

## 2022-04-13 DIAGNOSIS — M7022 Olecranon bursitis, left elbow: Secondary | ICD-10-CM | POA: Diagnosis not present

## 2022-04-13 DIAGNOSIS — M19122 Post-traumatic osteoarthritis, left elbow: Secondary | ICD-10-CM | POA: Diagnosis not present

## 2022-04-13 DIAGNOSIS — R2689 Other abnormalities of gait and mobility: Secondary | ICD-10-CM | POA: Diagnosis not present

## 2022-04-13 DIAGNOSIS — R278 Other lack of coordination: Secondary | ICD-10-CM | POA: Diagnosis not present

## 2022-04-13 DIAGNOSIS — R2681 Unsteadiness on feet: Secondary | ICD-10-CM | POA: Diagnosis not present

## 2022-04-13 DIAGNOSIS — Z950 Presence of cardiac pacemaker: Secondary | ICD-10-CM | POA: Diagnosis not present

## 2022-04-13 DIAGNOSIS — M6281 Muscle weakness (generalized): Secondary | ICD-10-CM | POA: Diagnosis not present

## 2022-04-14 DIAGNOSIS — R278 Other lack of coordination: Secondary | ICD-10-CM | POA: Diagnosis not present

## 2022-04-14 DIAGNOSIS — Z9181 History of falling: Secondary | ICD-10-CM | POA: Diagnosis not present

## 2022-04-14 DIAGNOSIS — M6281 Muscle weakness (generalized): Secondary | ICD-10-CM | POA: Diagnosis not present

## 2022-04-14 DIAGNOSIS — Z950 Presence of cardiac pacemaker: Secondary | ICD-10-CM | POA: Diagnosis not present

## 2022-04-14 DIAGNOSIS — S42402D Unspecified fracture of lower end of left humerus, subsequent encounter for fracture with routine healing: Secondary | ICD-10-CM | POA: Diagnosis not present

## 2022-04-14 DIAGNOSIS — D696 Thrombocytopenia, unspecified: Secondary | ICD-10-CM | POA: Diagnosis not present

## 2022-04-14 DIAGNOSIS — I495 Sick sinus syndrome: Secondary | ICD-10-CM | POA: Diagnosis not present

## 2022-04-14 DIAGNOSIS — S42201D Unspecified fracture of upper end of right humerus, subsequent encounter for fracture with routine healing: Secondary | ICD-10-CM | POA: Diagnosis not present

## 2022-04-14 DIAGNOSIS — M19122 Post-traumatic osteoarthritis, left elbow: Secondary | ICD-10-CM | POA: Diagnosis not present

## 2022-04-14 DIAGNOSIS — E1140 Type 2 diabetes mellitus with diabetic neuropathy, unspecified: Secondary | ICD-10-CM | POA: Diagnosis not present

## 2022-04-14 DIAGNOSIS — R2681 Unsteadiness on feet: Secondary | ICD-10-CM | POA: Diagnosis not present

## 2022-04-14 DIAGNOSIS — M7022 Olecranon bursitis, left elbow: Secondary | ICD-10-CM | POA: Diagnosis not present

## 2022-04-14 DIAGNOSIS — R262 Difficulty in walking, not elsewhere classified: Secondary | ICD-10-CM | POA: Diagnosis not present

## 2022-04-14 DIAGNOSIS — R2689 Other abnormalities of gait and mobility: Secondary | ICD-10-CM | POA: Diagnosis not present

## 2022-04-14 DIAGNOSIS — I442 Atrioventricular block, complete: Secondary | ICD-10-CM | POA: Diagnosis not present

## 2022-04-15 DIAGNOSIS — S42201D Unspecified fracture of upper end of right humerus, subsequent encounter for fracture with routine healing: Secondary | ICD-10-CM | POA: Diagnosis not present

## 2022-04-15 DIAGNOSIS — Z9181 History of falling: Secondary | ICD-10-CM | POA: Diagnosis not present

## 2022-04-15 DIAGNOSIS — M7022 Olecranon bursitis, left elbow: Secondary | ICD-10-CM | POA: Diagnosis not present

## 2022-04-15 DIAGNOSIS — R262 Difficulty in walking, not elsewhere classified: Secondary | ICD-10-CM | POA: Diagnosis not present

## 2022-04-15 DIAGNOSIS — M6281 Muscle weakness (generalized): Secondary | ICD-10-CM | POA: Diagnosis not present

## 2022-04-15 DIAGNOSIS — Z950 Presence of cardiac pacemaker: Secondary | ICD-10-CM | POA: Diagnosis not present

## 2022-04-15 DIAGNOSIS — I442 Atrioventricular block, complete: Secondary | ICD-10-CM | POA: Diagnosis not present

## 2022-04-15 DIAGNOSIS — S42402D Unspecified fracture of lower end of left humerus, subsequent encounter for fracture with routine healing: Secondary | ICD-10-CM | POA: Diagnosis not present

## 2022-04-15 DIAGNOSIS — M19122 Post-traumatic osteoarthritis, left elbow: Secondary | ICD-10-CM | POA: Diagnosis not present

## 2022-04-15 DIAGNOSIS — I495 Sick sinus syndrome: Secondary | ICD-10-CM | POA: Diagnosis not present

## 2022-04-15 DIAGNOSIS — R2689 Other abnormalities of gait and mobility: Secondary | ICD-10-CM | POA: Diagnosis not present

## 2022-04-15 DIAGNOSIS — R2681 Unsteadiness on feet: Secondary | ICD-10-CM | POA: Diagnosis not present

## 2022-04-15 DIAGNOSIS — D696 Thrombocytopenia, unspecified: Secondary | ICD-10-CM | POA: Diagnosis not present

## 2022-04-15 DIAGNOSIS — R278 Other lack of coordination: Secondary | ICD-10-CM | POA: Diagnosis not present

## 2022-04-15 DIAGNOSIS — E1140 Type 2 diabetes mellitus with diabetic neuropathy, unspecified: Secondary | ICD-10-CM | POA: Diagnosis not present

## 2022-04-16 DIAGNOSIS — E1140 Type 2 diabetes mellitus with diabetic neuropathy, unspecified: Secondary | ICD-10-CM | POA: Diagnosis not present

## 2022-04-16 DIAGNOSIS — R2689 Other abnormalities of gait and mobility: Secondary | ICD-10-CM | POA: Diagnosis not present

## 2022-04-16 DIAGNOSIS — S42402D Unspecified fracture of lower end of left humerus, subsequent encounter for fracture with routine healing: Secondary | ICD-10-CM | POA: Diagnosis not present

## 2022-04-16 DIAGNOSIS — D696 Thrombocytopenia, unspecified: Secondary | ICD-10-CM | POA: Diagnosis not present

## 2022-04-16 DIAGNOSIS — M19122 Post-traumatic osteoarthritis, left elbow: Secondary | ICD-10-CM | POA: Diagnosis not present

## 2022-04-16 DIAGNOSIS — M7022 Olecranon bursitis, left elbow: Secondary | ICD-10-CM | POA: Diagnosis not present

## 2022-04-16 DIAGNOSIS — R278 Other lack of coordination: Secondary | ICD-10-CM | POA: Diagnosis not present

## 2022-04-16 DIAGNOSIS — Z950 Presence of cardiac pacemaker: Secondary | ICD-10-CM | POA: Diagnosis not present

## 2022-04-16 DIAGNOSIS — S42201D Unspecified fracture of upper end of right humerus, subsequent encounter for fracture with routine healing: Secondary | ICD-10-CM | POA: Diagnosis not present

## 2022-04-16 DIAGNOSIS — I495 Sick sinus syndrome: Secondary | ICD-10-CM | POA: Diagnosis not present

## 2022-04-16 DIAGNOSIS — M6281 Muscle weakness (generalized): Secondary | ICD-10-CM | POA: Diagnosis not present

## 2022-04-16 DIAGNOSIS — I442 Atrioventricular block, complete: Secondary | ICD-10-CM | POA: Diagnosis not present

## 2022-04-16 DIAGNOSIS — R262 Difficulty in walking, not elsewhere classified: Secondary | ICD-10-CM | POA: Diagnosis not present

## 2022-04-16 DIAGNOSIS — R2681 Unsteadiness on feet: Secondary | ICD-10-CM | POA: Diagnosis not present

## 2022-04-16 DIAGNOSIS — Z9181 History of falling: Secondary | ICD-10-CM | POA: Diagnosis not present

## 2022-04-18 DIAGNOSIS — S42402D Unspecified fracture of lower end of left humerus, subsequent encounter for fracture with routine healing: Secondary | ICD-10-CM | POA: Diagnosis not present

## 2022-04-18 DIAGNOSIS — R262 Difficulty in walking, not elsewhere classified: Secondary | ICD-10-CM | POA: Diagnosis not present

## 2022-04-18 DIAGNOSIS — R278 Other lack of coordination: Secondary | ICD-10-CM | POA: Diagnosis not present

## 2022-04-18 DIAGNOSIS — Z9181 History of falling: Secondary | ICD-10-CM | POA: Diagnosis not present

## 2022-04-18 DIAGNOSIS — S42201D Unspecified fracture of upper end of right humerus, subsequent encounter for fracture with routine healing: Secondary | ICD-10-CM | POA: Diagnosis not present

## 2022-04-18 DIAGNOSIS — I442 Atrioventricular block, complete: Secondary | ICD-10-CM | POA: Diagnosis not present

## 2022-04-18 DIAGNOSIS — R2689 Other abnormalities of gait and mobility: Secondary | ICD-10-CM | POA: Diagnosis not present

## 2022-04-18 DIAGNOSIS — E1140 Type 2 diabetes mellitus with diabetic neuropathy, unspecified: Secondary | ICD-10-CM | POA: Diagnosis not present

## 2022-04-18 DIAGNOSIS — M19122 Post-traumatic osteoarthritis, left elbow: Secondary | ICD-10-CM | POA: Diagnosis not present

## 2022-04-18 DIAGNOSIS — D696 Thrombocytopenia, unspecified: Secondary | ICD-10-CM | POA: Diagnosis not present

## 2022-04-18 DIAGNOSIS — M7022 Olecranon bursitis, left elbow: Secondary | ICD-10-CM | POA: Diagnosis not present

## 2022-04-18 DIAGNOSIS — Z950 Presence of cardiac pacemaker: Secondary | ICD-10-CM | POA: Diagnosis not present

## 2022-04-18 DIAGNOSIS — M6281 Muscle weakness (generalized): Secondary | ICD-10-CM | POA: Diagnosis not present

## 2022-04-18 DIAGNOSIS — I495 Sick sinus syndrome: Secondary | ICD-10-CM | POA: Diagnosis not present

## 2022-04-18 DIAGNOSIS — R2681 Unsteadiness on feet: Secondary | ICD-10-CM | POA: Diagnosis not present

## 2022-04-19 ENCOUNTER — Ambulatory Visit (INDEPENDENT_AMBULATORY_CARE_PROVIDER_SITE_OTHER): Payer: Medicare Other | Admitting: Physician Assistant

## 2022-04-19 ENCOUNTER — Encounter: Payer: Self-pay | Admitting: Physician Assistant

## 2022-04-19 VITALS — BP 128/70 | HR 68 | Ht 70.0 in | Wt 170.0 lb

## 2022-04-19 DIAGNOSIS — R338 Other retention of urine: Secondary | ICD-10-CM | POA: Diagnosis not present

## 2022-04-19 LAB — BLADDER SCAN AMB NON-IMAGING: Scan Result: 168

## 2022-04-19 NOTE — Progress Notes (Signed)
04/19/2022 9:11 AM   Michael Colon [birth year] [id number]  CC: Chief Complaint  Patient presents with   Urinary Retention   HPI: Michael Colon is a 89 y.o. male with PMH BPH on Flomax and finasteride, epididymitis/prostatitis, and history of urinary retention in the setting of fecal impaction who presents today for repeat PVR.   Today he reports he continues to void without difficulty and has no acute concerns today.  He does not understand why he is here.  PVR 168 mL.  PMH: Past Medical History:  Diagnosis Date   Asthma    Atrial flutter 2007   Band keratopathy    Benign prostatic hypertrophy    Chronic ear infection    Chronic kidney disease    Chronic prostatitis    Dental bridge present    permanent - upper   Diabetes mellitus    Elbow stiffness, left    s/p fracture many yrs ago.  arm does not straighten   GERD (gastroesophageal reflux disease)    laryngeal involvement   Hyperlipidemia    Hypertension    Obstructive sleep apnea    CPAP-9   Osteoarthrosis, localized, primary, knee    post-traumatic   Prostatitis    Stroke    TIA   Tachy-brady syndrome    UTI (urinary tract infection)     Surgical History: Past Surgical History:  Procedure Laterality Date   APPENDECTOMY     BELPHAROPTOSIS REPAIR     Dr [name]---didn't resolve weepy eye and eyelid drooping   CARDIOVERSION  04/13/2010   CATARACT EXTRACTION, BILATERAL  2009   Chest pain  8/12   Stress test benign   ESOPHAGEAL DILATION  05/26/2016   Procedure: ESOPHAGEAL DILATION;  Surgeon: [name], MD;  Location: [name];  Service: Endoscopy;;   ESOPHAGOGASTRODUODENOSCOPY (EGD) WITH PROPOFOL N/A 05/26/2016   Procedure: ESOPHAGOGASTRODUODENOSCOPY (EGD) WITH PROPOFOL;  Surgeon: [name], MD;  Location: [facility];  Service: Endoscopy;  Laterality: N/A;  Diabetic - oral meds sleep apnea   EYE SURGERY     FRACTURE SURGERY Left    elbow   KNEE SURGERY  1998   plate after  fracture, then removed for infection   left elbow surgery     MASTOIDECTOMY  8/08   Dr [name]   PACEMAKER IMPLANT N/A 09/02/2021   Procedure: PACEMAKER IMPLANT;  Surgeon: [name], MD;  Location: [facility] INVASIVE CV LAB;  Service: Cardiovascular;  Laterality: N/A;   RHINOPLASTY  5/10   and septoplasty   SUBACROMIAL DECOMPRESSION Right 2005   Arthroscopic (for rotator cuff and biceps tendon ruptures)   TEAR DUCT PROBING  11/13   Dr [name]   TOTAL KNEE ARTHROPLASTY Left 09/01/2014   Procedure: TOTAL KNEE ARTHROPLASTY;  Surgeon: [name], MD;  Location: [facility] ORS;  Service: Orthopedics;  Laterality: Left;   TRIGGER FINGER RELEASE Left 02/25/2015   Procedure: LEFT LONG TRIGGER RELEASE;  Surgeon: [name], MD;  Location: [facility] ORS;  Service: Orthopedics;  Laterality: Left;   VENOUS ABLATION      Home Medications:  Allergies as of 04/19/2022       Reactions   Lovenox [enoxaparin] Hives   Proscar [finasteride] Other (See Comments)   Upper body and arm weakness   Latex Rash   Has trouble with BANDAIDS that have been left on for more than 24 hours. Prefers paper tape.   Nickel Rash   Localized rash   Percocet [oxycodone-acetaminophen] Rash   Tape Rash,  Other (See Comments)   Sensitivity         Medication List        Accurate as of April 19, 2022  9:11 AM. If you have any questions, ask your nurse or doctor.          acetaminophen 500 MG tablet Commonly known as: TYLENOL Take 1,000 mg by mouth every 8 (eight) hours as needed.   APPLE CIDER VINEGAR PO Take 1 capsule by mouth at bedtime.   atorvastatin 80 MG tablet Commonly known as: LIPITOR Take 1 tablet (80 mg total) by mouth daily.   B-12 PO Take 1,000 mg by mouth daily.   Cranberry 250 MG Tabs Take 1 tablet by mouth 2 (two) times daily.   Eliquis 2.5 MG Tabs tablet Generic drug: apixaban Take 2.5 mg by mouth 2 (two) times daily.   finasteride 5 MG tablet Commonly known as: PROSCAR Take 1  tablet (5 mg total) by mouth daily.   lactose free nutrition Liqd Take 237 mLs by mouth 3 (three) times daily between meals.   lidocaine 5 % Commonly known as: LIDODERM Place 1 patch onto the skin daily. Remove & Discard patch within 12 hours or as directed by MD   Magnesium Oxide 400 MG Caps Take 1 capsule (400 mg total) by mouth daily.   metFORMIN 500 MG tablet Commonly known as: GLUCOPHAGE TAKE 1 TABLET BY MOUTH  TWICE DAILY WITH MEALS   mirtazapine 7.5 MG tablet Commonly known as: REMERON Take 1 tablet (7.5 mg total) by mouth at bedtime.   polyethylene glycol 17 g packet Commonly known as: MIRALAX / GLYCOLAX Take 17 g by mouth. Daily every Monday, Wednesday and Friday.   senna 8.6 MG Tabs tablet Commonly known as: SENOKOT Take 1 tablet (8.6 mg total) by mouth daily.   sertraline 50 MG tablet Commonly known as: ZOLOFT Take 50 mg by mouth daily.   silver sulfADIAZINE 1 % cream Commonly known as: SILVADENE Apply 1 Application topically daily.   sodium chloride 0.65 % Soln nasal spray Commonly known as: OCEAN Place 2 sprays into both nostrils 2 (two) times daily as needed for congestion.   SYSTANE ULTRA OP Place 1 drop into both eyes daily as needed (dry eyes).   tamsulosin 0.4 MG Caps capsule Commonly known as: FLOMAX Take 2 capsules (0.8 mg total) by mouth at bedtime.   TUSSIN PO Take 10 mLs by mouth every 4 (four) hours as needed.        Allergies:  Allergies  Allergen Reactions   Lovenox [Enoxaparin] Hives   Proscar [Finasteride] Other (See Comments)    Upper body and arm weakness   Latex Rash    Has trouble with BANDAIDS that have been left on for more than 24 hours. Prefers paper tape.   Nickel Rash    Localized rash   Percocet [Oxycodone-Acetaminophen] Rash   Tape Rash and Other (See Comments)    Sensitivity     Family History: Family History  Problem Relation Age of Onset   Colon cancer Father    Diabetes Father    Hypertension Father     Other Mother        natural causes   Heart attack Neg Hx    Stroke Neg Hx     Social History:   reports that he quit smoking about 53 years ago. His smoking use included cigarettes. He smoked an average of 1.00 packs per day. He has never been exposed to tobacco smoke.  He has never used smokeless tobacco. He reports that he does not drink alcohol and does not use drugs.  Physical Exam: BP 128/70   Pulse 68   Ht  (1.778 m)   Wt 170 lb (77.1 kg)   BMI 24.39 kg/m   Constitutional:  Alert, no acute distress, nontoxic appearing HEENT: [place], AT Cardiovascular: No clubbing, cyanosis, or edema Respiratory: Normal respiratory effort, no increased work of breathing Skin: No rashes, bruises or suspicious lesions Neurologic: Grossly intact, no focal deficits, moving all 4 extremities Psychiatric: Normal mood and affect  Laboratory Data: Results for orders placed or performed in visit on 04/19/22  Bladder Scan (Post Void Residual) in office  Result Value Ref Range   Scan Result 168    Assessment & Plan:   1. Acute urinary retention Resolved.  PVR slightly elevated, though he is tolerating this well and asymptomatic today.  Recommend conservative management.  We discussed continuing Flomax and finasteride and only replacing Foley catheter in the setting of significantly elevated PVR, greater than 400 mL, or reports of the inability to void or abdominal pain.  Okay to follow-up as needed - Bladder Scan (Post Void Residual) in office . Return if symptoms worsen or fail to improve.  Carman Ching, PA-C  [facility] [place] [address], [address] [place], [place] [address] [phone]

## 2022-04-20 DIAGNOSIS — M7022 Olecranon bursitis, left elbow: Secondary | ICD-10-CM | POA: Diagnosis not present

## 2022-04-20 DIAGNOSIS — Z9181 History of falling: Secondary | ICD-10-CM | POA: Diagnosis not present

## 2022-04-20 DIAGNOSIS — I442 Atrioventricular block, complete: Secondary | ICD-10-CM | POA: Diagnosis not present

## 2022-04-20 DIAGNOSIS — D696 Thrombocytopenia, unspecified: Secondary | ICD-10-CM | POA: Diagnosis not present

## 2022-04-20 DIAGNOSIS — M19122 Post-traumatic osteoarthritis, left elbow: Secondary | ICD-10-CM | POA: Diagnosis not present

## 2022-04-20 DIAGNOSIS — S42402D Unspecified fracture of lower end of left humerus, subsequent encounter for fracture with routine healing: Secondary | ICD-10-CM | POA: Diagnosis not present

## 2022-04-20 DIAGNOSIS — R2681 Unsteadiness on feet: Secondary | ICD-10-CM | POA: Diagnosis not present

## 2022-04-20 DIAGNOSIS — R2689 Other abnormalities of gait and mobility: Secondary | ICD-10-CM | POA: Diagnosis not present

## 2022-04-20 DIAGNOSIS — Z950 Presence of cardiac pacemaker: Secondary | ICD-10-CM | POA: Diagnosis not present

## 2022-04-20 DIAGNOSIS — R262 Difficulty in walking, not elsewhere classified: Secondary | ICD-10-CM | POA: Diagnosis not present

## 2022-04-20 DIAGNOSIS — R278 Other lack of coordination: Secondary | ICD-10-CM | POA: Diagnosis not present

## 2022-04-20 DIAGNOSIS — S42201D Unspecified fracture of upper end of right humerus, subsequent encounter for fracture with routine healing: Secondary | ICD-10-CM | POA: Diagnosis not present

## 2022-04-20 DIAGNOSIS — M6281 Muscle weakness (generalized): Secondary | ICD-10-CM | POA: Diagnosis not present

## 2022-04-20 DIAGNOSIS — E1140 Type 2 diabetes mellitus with diabetic neuropathy, unspecified: Secondary | ICD-10-CM | POA: Diagnosis not present

## 2022-04-20 DIAGNOSIS — I495 Sick sinus syndrome: Secondary | ICD-10-CM | POA: Diagnosis not present

## 2022-04-21 DIAGNOSIS — M19122 Post-traumatic osteoarthritis, left elbow: Secondary | ICD-10-CM | POA: Diagnosis not present

## 2022-04-21 DIAGNOSIS — S42402D Unspecified fracture of lower end of left humerus, subsequent encounter for fracture with routine healing: Secondary | ICD-10-CM | POA: Diagnosis not present

## 2022-04-21 DIAGNOSIS — I442 Atrioventricular block, complete: Secondary | ICD-10-CM | POA: Diagnosis not present

## 2022-04-21 DIAGNOSIS — M7022 Olecranon bursitis, left elbow: Secondary | ICD-10-CM | POA: Diagnosis not present

## 2022-04-21 DIAGNOSIS — D696 Thrombocytopenia, unspecified: Secondary | ICD-10-CM | POA: Diagnosis not present

## 2022-04-21 DIAGNOSIS — R2689 Other abnormalities of gait and mobility: Secondary | ICD-10-CM | POA: Diagnosis not present

## 2022-04-21 DIAGNOSIS — S42201D Unspecified fracture of upper end of right humerus, subsequent encounter for fracture with routine healing: Secondary | ICD-10-CM | POA: Diagnosis not present

## 2022-04-21 DIAGNOSIS — Z9181 History of falling: Secondary | ICD-10-CM | POA: Diagnosis not present

## 2022-04-21 DIAGNOSIS — R2681 Unsteadiness on feet: Secondary | ICD-10-CM | POA: Diagnosis not present

## 2022-04-21 DIAGNOSIS — M6281 Muscle weakness (generalized): Secondary | ICD-10-CM | POA: Diagnosis not present

## 2022-04-21 DIAGNOSIS — R278 Other lack of coordination: Secondary | ICD-10-CM | POA: Diagnosis not present

## 2022-04-21 DIAGNOSIS — Z950 Presence of cardiac pacemaker: Secondary | ICD-10-CM | POA: Diagnosis not present

## 2022-04-21 DIAGNOSIS — I495 Sick sinus syndrome: Secondary | ICD-10-CM | POA: Diagnosis not present

## 2022-04-21 DIAGNOSIS — R262 Difficulty in walking, not elsewhere classified: Secondary | ICD-10-CM | POA: Diagnosis not present

## 2022-04-21 DIAGNOSIS — E1140 Type 2 diabetes mellitus with diabetic neuropathy, unspecified: Secondary | ICD-10-CM | POA: Diagnosis not present

## 2022-04-22 DIAGNOSIS — S42201D Unspecified fracture of upper end of right humerus, subsequent encounter for fracture with routine healing: Secondary | ICD-10-CM | POA: Diagnosis not present

## 2022-04-22 DIAGNOSIS — R2681 Unsteadiness on feet: Secondary | ICD-10-CM | POA: Diagnosis not present

## 2022-04-22 DIAGNOSIS — M6281 Muscle weakness (generalized): Secondary | ICD-10-CM | POA: Diagnosis not present

## 2022-04-22 DIAGNOSIS — I442 Atrioventricular block, complete: Secondary | ICD-10-CM | POA: Diagnosis not present

## 2022-04-22 DIAGNOSIS — S42402D Unspecified fracture of lower end of left humerus, subsequent encounter for fracture with routine healing: Secondary | ICD-10-CM | POA: Diagnosis not present

## 2022-04-22 DIAGNOSIS — Z9181 History of falling: Secondary | ICD-10-CM | POA: Diagnosis not present

## 2022-04-22 DIAGNOSIS — R278 Other lack of coordination: Secondary | ICD-10-CM | POA: Diagnosis not present

## 2022-04-22 DIAGNOSIS — M7022 Olecranon bursitis, left elbow: Secondary | ICD-10-CM | POA: Diagnosis not present

## 2022-04-22 DIAGNOSIS — Z950 Presence of cardiac pacemaker: Secondary | ICD-10-CM | POA: Diagnosis not present

## 2022-04-22 DIAGNOSIS — E1140 Type 2 diabetes mellitus with diabetic neuropathy, unspecified: Secondary | ICD-10-CM | POA: Diagnosis not present

## 2022-04-22 DIAGNOSIS — D696 Thrombocytopenia, unspecified: Secondary | ICD-10-CM | POA: Diagnosis not present

## 2022-04-22 DIAGNOSIS — R2689 Other abnormalities of gait and mobility: Secondary | ICD-10-CM | POA: Diagnosis not present

## 2022-04-22 DIAGNOSIS — I495 Sick sinus syndrome: Secondary | ICD-10-CM | POA: Diagnosis not present

## 2022-04-22 DIAGNOSIS — R262 Difficulty in walking, not elsewhere classified: Secondary | ICD-10-CM | POA: Diagnosis not present

## 2022-04-22 DIAGNOSIS — M19122 Post-traumatic osteoarthritis, left elbow: Secondary | ICD-10-CM | POA: Diagnosis not present

## 2022-04-23 DIAGNOSIS — M6281 Muscle weakness (generalized): Secondary | ICD-10-CM | POA: Diagnosis not present

## 2022-04-23 DIAGNOSIS — M7022 Olecranon bursitis, left elbow: Secondary | ICD-10-CM | POA: Diagnosis not present

## 2022-04-23 DIAGNOSIS — S42201D Unspecified fracture of upper end of right humerus, subsequent encounter for fracture with routine healing: Secondary | ICD-10-CM | POA: Diagnosis not present

## 2022-04-23 DIAGNOSIS — E1140 Type 2 diabetes mellitus with diabetic neuropathy, unspecified: Secondary | ICD-10-CM | POA: Diagnosis not present

## 2022-04-23 DIAGNOSIS — I442 Atrioventricular block, complete: Secondary | ICD-10-CM | POA: Diagnosis not present

## 2022-04-23 DIAGNOSIS — S42402D Unspecified fracture of lower end of left humerus, subsequent encounter for fracture with routine healing: Secondary | ICD-10-CM | POA: Diagnosis not present

## 2022-04-23 DIAGNOSIS — R278 Other lack of coordination: Secondary | ICD-10-CM | POA: Diagnosis not present

## 2022-04-23 DIAGNOSIS — I495 Sick sinus syndrome: Secondary | ICD-10-CM | POA: Diagnosis not present

## 2022-04-23 DIAGNOSIS — R2681 Unsteadiness on feet: Secondary | ICD-10-CM | POA: Diagnosis not present

## 2022-04-23 DIAGNOSIS — R262 Difficulty in walking, not elsewhere classified: Secondary | ICD-10-CM | POA: Diagnosis not present

## 2022-04-23 DIAGNOSIS — R2689 Other abnormalities of gait and mobility: Secondary | ICD-10-CM | POA: Diagnosis not present

## 2022-04-23 DIAGNOSIS — D696 Thrombocytopenia, unspecified: Secondary | ICD-10-CM | POA: Diagnosis not present

## 2022-04-23 DIAGNOSIS — Z9181 History of falling: Secondary | ICD-10-CM | POA: Diagnosis not present

## 2022-04-23 DIAGNOSIS — Z950 Presence of cardiac pacemaker: Secondary | ICD-10-CM | POA: Diagnosis not present

## 2022-04-23 DIAGNOSIS — M19122 Post-traumatic osteoarthritis, left elbow: Secondary | ICD-10-CM | POA: Diagnosis not present

## 2022-04-24 DIAGNOSIS — S42402D Unspecified fracture of lower end of left humerus, subsequent encounter for fracture with routine healing: Secondary | ICD-10-CM | POA: Diagnosis not present

## 2022-04-24 DIAGNOSIS — M6281 Muscle weakness (generalized): Secondary | ICD-10-CM | POA: Diagnosis not present

## 2022-04-24 DIAGNOSIS — R262 Difficulty in walking, not elsewhere classified: Secondary | ICD-10-CM | POA: Diagnosis not present

## 2022-04-24 DIAGNOSIS — S42201D Unspecified fracture of upper end of right humerus, subsequent encounter for fracture with routine healing: Secondary | ICD-10-CM | POA: Diagnosis not present

## 2022-04-24 DIAGNOSIS — E1140 Type 2 diabetes mellitus with diabetic neuropathy, unspecified: Secondary | ICD-10-CM | POA: Diagnosis not present

## 2022-04-24 DIAGNOSIS — R2681 Unsteadiness on feet: Secondary | ICD-10-CM | POA: Diagnosis not present

## 2022-04-24 DIAGNOSIS — D696 Thrombocytopenia, unspecified: Secondary | ICD-10-CM | POA: Diagnosis not present

## 2022-04-24 DIAGNOSIS — R2689 Other abnormalities of gait and mobility: Secondary | ICD-10-CM | POA: Diagnosis not present

## 2022-04-24 DIAGNOSIS — I442 Atrioventricular block, complete: Secondary | ICD-10-CM | POA: Diagnosis not present

## 2022-04-24 DIAGNOSIS — R278 Other lack of coordination: Secondary | ICD-10-CM | POA: Diagnosis not present

## 2022-04-24 DIAGNOSIS — Z9181 History of falling: Secondary | ICD-10-CM | POA: Diagnosis not present

## 2022-04-24 DIAGNOSIS — I495 Sick sinus syndrome: Secondary | ICD-10-CM | POA: Diagnosis not present

## 2022-04-24 DIAGNOSIS — M7022 Olecranon bursitis, left elbow: Secondary | ICD-10-CM | POA: Diagnosis not present

## 2022-04-24 DIAGNOSIS — M19122 Post-traumatic osteoarthritis, left elbow: Secondary | ICD-10-CM | POA: Diagnosis not present

## 2022-04-24 DIAGNOSIS — Z950 Presence of cardiac pacemaker: Secondary | ICD-10-CM | POA: Diagnosis not present

## 2022-04-25 DIAGNOSIS — S42201D Unspecified fracture of upper end of right humerus, subsequent encounter for fracture with routine healing: Secondary | ICD-10-CM | POA: Diagnosis not present

## 2022-04-25 DIAGNOSIS — Z950 Presence of cardiac pacemaker: Secondary | ICD-10-CM | POA: Diagnosis not present

## 2022-04-25 DIAGNOSIS — I495 Sick sinus syndrome: Secondary | ICD-10-CM | POA: Diagnosis not present

## 2022-04-25 DIAGNOSIS — S42402D Unspecified fracture of lower end of left humerus, subsequent encounter for fracture with routine healing: Secondary | ICD-10-CM | POA: Diagnosis not present

## 2022-04-25 DIAGNOSIS — Z9181 History of falling: Secondary | ICD-10-CM | POA: Diagnosis not present

## 2022-04-25 DIAGNOSIS — R262 Difficulty in walking, not elsewhere classified: Secondary | ICD-10-CM | POA: Diagnosis not present

## 2022-04-25 DIAGNOSIS — M19122 Post-traumatic osteoarthritis, left elbow: Secondary | ICD-10-CM | POA: Diagnosis not present

## 2022-04-25 DIAGNOSIS — M6281 Muscle weakness (generalized): Secondary | ICD-10-CM | POA: Diagnosis not present

## 2022-04-25 DIAGNOSIS — R2689 Other abnormalities of gait and mobility: Secondary | ICD-10-CM | POA: Diagnosis not present

## 2022-04-25 DIAGNOSIS — I442 Atrioventricular block, complete: Secondary | ICD-10-CM | POA: Diagnosis not present

## 2022-04-25 DIAGNOSIS — R278 Other lack of coordination: Secondary | ICD-10-CM | POA: Diagnosis not present

## 2022-04-25 DIAGNOSIS — D696 Thrombocytopenia, unspecified: Secondary | ICD-10-CM | POA: Diagnosis not present

## 2022-04-25 DIAGNOSIS — R2681 Unsteadiness on feet: Secondary | ICD-10-CM | POA: Diagnosis not present

## 2022-04-25 DIAGNOSIS — E1140 Type 2 diabetes mellitus with diabetic neuropathy, unspecified: Secondary | ICD-10-CM | POA: Diagnosis not present

## 2022-04-25 DIAGNOSIS — M7022 Olecranon bursitis, left elbow: Secondary | ICD-10-CM | POA: Diagnosis not present

## 2022-04-26 NOTE — Progress Notes (Signed)
Remote pacemaker transmission.   

## 2022-04-27 DIAGNOSIS — M19122 Post-traumatic osteoarthritis, left elbow: Secondary | ICD-10-CM | POA: Diagnosis not present

## 2022-04-27 DIAGNOSIS — E1140 Type 2 diabetes mellitus with diabetic neuropathy, unspecified: Secondary | ICD-10-CM | POA: Diagnosis not present

## 2022-04-27 DIAGNOSIS — R278 Other lack of coordination: Secondary | ICD-10-CM | POA: Diagnosis not present

## 2022-04-27 DIAGNOSIS — R262 Difficulty in walking, not elsewhere classified: Secondary | ICD-10-CM | POA: Diagnosis not present

## 2022-04-27 DIAGNOSIS — M6281 Muscle weakness (generalized): Secondary | ICD-10-CM | POA: Diagnosis not present

## 2022-04-27 DIAGNOSIS — S42402D Unspecified fracture of lower end of left humerus, subsequent encounter for fracture with routine healing: Secondary | ICD-10-CM | POA: Diagnosis not present

## 2022-04-27 DIAGNOSIS — I495 Sick sinus syndrome: Secondary | ICD-10-CM | POA: Diagnosis not present

## 2022-04-27 DIAGNOSIS — S42201D Unspecified fracture of upper end of right humerus, subsequent encounter for fracture with routine healing: Secondary | ICD-10-CM | POA: Diagnosis not present

## 2022-04-27 DIAGNOSIS — Z9181 History of falling: Secondary | ICD-10-CM | POA: Diagnosis not present

## 2022-04-27 DIAGNOSIS — D696 Thrombocytopenia, unspecified: Secondary | ICD-10-CM | POA: Diagnosis not present

## 2022-04-27 DIAGNOSIS — R2689 Other abnormalities of gait and mobility: Secondary | ICD-10-CM | POA: Diagnosis not present

## 2022-04-27 DIAGNOSIS — Z950 Presence of cardiac pacemaker: Secondary | ICD-10-CM | POA: Diagnosis not present

## 2022-04-27 DIAGNOSIS — I442 Atrioventricular block, complete: Secondary | ICD-10-CM | POA: Diagnosis not present

## 2022-04-27 DIAGNOSIS — R2681 Unsteadiness on feet: Secondary | ICD-10-CM | POA: Diagnosis not present

## 2022-04-27 DIAGNOSIS — M7022 Olecranon bursitis, left elbow: Secondary | ICD-10-CM | POA: Diagnosis not present

## 2022-04-28 ENCOUNTER — Ambulatory Visit: Payer: Medicare Other | Admitting: Urology

## 2022-04-28 DIAGNOSIS — R2689 Other abnormalities of gait and mobility: Secondary | ICD-10-CM | POA: Diagnosis not present

## 2022-04-28 DIAGNOSIS — I442 Atrioventricular block, complete: Secondary | ICD-10-CM | POA: Diagnosis not present

## 2022-04-28 DIAGNOSIS — I495 Sick sinus syndrome: Secondary | ICD-10-CM | POA: Diagnosis not present

## 2022-04-28 DIAGNOSIS — E1140 Type 2 diabetes mellitus with diabetic neuropathy, unspecified: Secondary | ICD-10-CM | POA: Diagnosis not present

## 2022-04-28 DIAGNOSIS — R262 Difficulty in walking, not elsewhere classified: Secondary | ICD-10-CM | POA: Diagnosis not present

## 2022-04-28 DIAGNOSIS — R2681 Unsteadiness on feet: Secondary | ICD-10-CM | POA: Diagnosis not present

## 2022-04-28 DIAGNOSIS — Z9181 History of falling: Secondary | ICD-10-CM | POA: Diagnosis not present

## 2022-04-28 DIAGNOSIS — D696 Thrombocytopenia, unspecified: Secondary | ICD-10-CM | POA: Diagnosis not present

## 2022-04-28 DIAGNOSIS — M19122 Post-traumatic osteoarthritis, left elbow: Secondary | ICD-10-CM | POA: Diagnosis not present

## 2022-04-28 DIAGNOSIS — Z950 Presence of cardiac pacemaker: Secondary | ICD-10-CM | POA: Diagnosis not present

## 2022-04-28 DIAGNOSIS — M7022 Olecranon bursitis, left elbow: Secondary | ICD-10-CM | POA: Diagnosis not present

## 2022-04-28 DIAGNOSIS — S42201D Unspecified fracture of upper end of right humerus, subsequent encounter for fracture with routine healing: Secondary | ICD-10-CM | POA: Diagnosis not present

## 2022-04-28 DIAGNOSIS — R278 Other lack of coordination: Secondary | ICD-10-CM | POA: Diagnosis not present

## 2022-04-28 DIAGNOSIS — S42402D Unspecified fracture of lower end of left humerus, subsequent encounter for fracture with routine healing: Secondary | ICD-10-CM | POA: Diagnosis not present

## 2022-04-28 DIAGNOSIS — M6281 Muscle weakness (generalized): Secondary | ICD-10-CM | POA: Diagnosis not present

## 2022-04-29 DIAGNOSIS — S42402D Unspecified fracture of lower end of left humerus, subsequent encounter for fracture with routine healing: Secondary | ICD-10-CM | POA: Diagnosis not present

## 2022-04-29 DIAGNOSIS — R2689 Other abnormalities of gait and mobility: Secondary | ICD-10-CM | POA: Diagnosis not present

## 2022-04-29 DIAGNOSIS — R262 Difficulty in walking, not elsewhere classified: Secondary | ICD-10-CM | POA: Diagnosis not present

## 2022-04-29 DIAGNOSIS — S42201D Unspecified fracture of upper end of right humerus, subsequent encounter for fracture with routine healing: Secondary | ICD-10-CM | POA: Diagnosis not present

## 2022-04-29 DIAGNOSIS — I442 Atrioventricular block, complete: Secondary | ICD-10-CM | POA: Diagnosis not present

## 2022-04-29 DIAGNOSIS — I495 Sick sinus syndrome: Secondary | ICD-10-CM | POA: Diagnosis not present

## 2022-04-29 DIAGNOSIS — M7022 Olecranon bursitis, left elbow: Secondary | ICD-10-CM | POA: Diagnosis not present

## 2022-04-29 DIAGNOSIS — M6281 Muscle weakness (generalized): Secondary | ICD-10-CM | POA: Diagnosis not present

## 2022-04-29 DIAGNOSIS — R278 Other lack of coordination: Secondary | ICD-10-CM | POA: Diagnosis not present

## 2022-04-29 DIAGNOSIS — Z950 Presence of cardiac pacemaker: Secondary | ICD-10-CM | POA: Diagnosis not present

## 2022-04-29 DIAGNOSIS — R2681 Unsteadiness on feet: Secondary | ICD-10-CM | POA: Diagnosis not present

## 2022-04-29 DIAGNOSIS — D696 Thrombocytopenia, unspecified: Secondary | ICD-10-CM | POA: Diagnosis not present

## 2022-04-29 DIAGNOSIS — M19122 Post-traumatic osteoarthritis, left elbow: Secondary | ICD-10-CM | POA: Diagnosis not present

## 2022-04-29 DIAGNOSIS — Z9181 History of falling: Secondary | ICD-10-CM | POA: Diagnosis not present

## 2022-04-29 DIAGNOSIS — E1140 Type 2 diabetes mellitus with diabetic neuropathy, unspecified: Secondary | ICD-10-CM | POA: Diagnosis not present

## 2022-05-03 ENCOUNTER — Encounter: Payer: Self-pay | Admitting: Nurse Practitioner

## 2022-05-03 ENCOUNTER — Non-Acute Institutional Stay (SKILLED_NURSING_FACILITY): Payer: Medicare Other | Admitting: Nurse Practitioner

## 2022-05-03 DIAGNOSIS — I495 Sick sinus syndrome: Secondary | ICD-10-CM | POA: Diagnosis not present

## 2022-05-03 DIAGNOSIS — M1711 Unilateral primary osteoarthritis, right knee: Secondary | ICD-10-CM | POA: Diagnosis not present

## 2022-05-03 DIAGNOSIS — N401 Enlarged prostate with lower urinary tract symptoms: Secondary | ICD-10-CM

## 2022-05-03 DIAGNOSIS — E440 Moderate protein-calorie malnutrition: Secondary | ICD-10-CM | POA: Diagnosis not present

## 2022-05-03 DIAGNOSIS — Z950 Presence of cardiac pacemaker: Secondary | ICD-10-CM | POA: Diagnosis not present

## 2022-05-03 DIAGNOSIS — M19122 Post-traumatic osteoarthritis, left elbow: Secondary | ICD-10-CM | POA: Diagnosis not present

## 2022-05-03 DIAGNOSIS — I442 Atrioventricular block, complete: Secondary | ICD-10-CM | POA: Diagnosis not present

## 2022-05-03 DIAGNOSIS — N138 Other obstructive and reflux uropathy: Secondary | ICD-10-CM

## 2022-05-03 DIAGNOSIS — M7022 Olecranon bursitis, left elbow: Secondary | ICD-10-CM | POA: Diagnosis not present

## 2022-05-03 DIAGNOSIS — R262 Difficulty in walking, not elsewhere classified: Secondary | ICD-10-CM | POA: Diagnosis not present

## 2022-05-03 DIAGNOSIS — R2689 Other abnormalities of gait and mobility: Secondary | ICD-10-CM | POA: Diagnosis not present

## 2022-05-03 DIAGNOSIS — R2681 Unsteadiness on feet: Secondary | ICD-10-CM | POA: Diagnosis not present

## 2022-05-03 DIAGNOSIS — S42402D Unspecified fracture of lower end of left humerus, subsequent encounter for fracture with routine healing: Secondary | ICD-10-CM | POA: Diagnosis not present

## 2022-05-03 DIAGNOSIS — Z9181 History of falling: Secondary | ICD-10-CM | POA: Diagnosis not present

## 2022-05-03 DIAGNOSIS — E1140 Type 2 diabetes mellitus with diabetic neuropathy, unspecified: Secondary | ICD-10-CM

## 2022-05-03 DIAGNOSIS — K5900 Constipation, unspecified: Secondary | ICD-10-CM | POA: Diagnosis not present

## 2022-05-03 DIAGNOSIS — I4892 Unspecified atrial flutter: Secondary | ICD-10-CM

## 2022-05-03 DIAGNOSIS — S42201D Unspecified fracture of upper end of right humerus, subsequent encounter for fracture with routine healing: Secondary | ICD-10-CM | POA: Diagnosis not present

## 2022-05-03 DIAGNOSIS — M6281 Muscle weakness (generalized): Secondary | ICD-10-CM | POA: Diagnosis not present

## 2022-05-03 DIAGNOSIS — F39 Unspecified mood [affective] disorder: Secondary | ICD-10-CM

## 2022-05-03 DIAGNOSIS — E782 Mixed hyperlipidemia: Secondary | ICD-10-CM | POA: Diagnosis not present

## 2022-05-03 DIAGNOSIS — R278 Other lack of coordination: Secondary | ICD-10-CM | POA: Diagnosis not present

## 2022-05-03 DIAGNOSIS — D696 Thrombocytopenia, unspecified: Secondary | ICD-10-CM | POA: Diagnosis not present

## 2022-05-03 NOTE — Progress Notes (Signed)
Location:  Other [facility]) Nursing Home Room Number: 501 A Place of Service:  SNF (31)  Michael Colon K. Janyth Contes, NP    Patient Care Team: [name], MD as PCP - General (Family Medicine) [name], MD as PCP - Cardiology (Cardiology) Antonieta Iba, MD as Consulting Physician (Cardiology)  Extended Emergency Contact Information Primary Emergency Contact: Jullian, [facility]) Mobile Phone: [phone] Relation: Son  Goals of care: Advanced Directive information    05/03/2022   11:24 AM  Advanced Directives  Does Patient Have a Medical Advance Directive? Yes  Type of Estate agent of [place];Out of facility DNR (pink MOST or yellow form);Living will  Does patient want to make changes to medical advance directive? No - Patient declined  Copy of Healthcare Power of Attorney in Chart? Yes - validated most recent copy scanned in chart (See row information)  Pre-existing out of facility DNR order (yellow form or pink MOST form) Yellow form placed in chart (order not valid for inpatient use)     Chief Complaint  Patient presents with   Medical Management of Chronic Issues    Routine visit. Discuss need for A1c, shingrix, covid booster, and eye exam     HPI:  Pt is a 89 y.o. male seen today for medical management of chronic disease. Pt at [facility] for long term care.  Pt with hx of BPH, constipation, OA, protein calorie malnutrition. His appetite has improved and he is eating better.  Getting around more by self propelling in wheelchair. States his right knee is causing him more pain and in the past he was seen by orthopedics and had injections.  Denies trouble with urination No constipation.    Past Medical History:  Diagnosis Date   Asthma    Atrial flutter (HCC) 2007   Band keratopathy    Benign prostatic hypertrophy    Chronic ear infection    Chronic kidney disease    Chronic prostatitis    Dental bridge present     permanent - upper   Diabetes mellitus    Elbow stiffness, left    s/p fracture many yrs ago.  arm does not straighten   GERD (gastroesophageal reflux disease)    laryngeal involvement   Hyperlipidemia    Hypertension    Obstructive sleep apnea    CPAP-9   Osteoarthrosis, localized, primary, knee    post-traumatic   Prostatitis    Stroke (HCC)    TIA   Tachy-brady syndrome (HCC)    UTI (urinary tract infection)    Past Surgical History:  Procedure Laterality Date   APPENDECTOMY     BELPHAROPTOSIS REPAIR     Dr [name]---didn't resolve weepy eye and eyelid drooping   CARDIOVERSION  04/13/2010   CATARACT EXTRACTION, BILATERAL  2009   Chest pain  8/12   Stress test benign   ESOPHAGEAL DILATION  05/26/2016   Procedure: ESOPHAGEAL DILATION;  Surgeon: [name], MD;  Location: [facility];  Service: Endoscopy;;   ESOPHAGOGASTRODUODENOSCOPY (EGD) WITH PROPOFOL N/A 05/26/2016   Procedure: ESOPHAGOGASTRODUODENOSCOPY (EGD) WITH PROPOFOL;  Surgeon: [name], MD;  Location: [facility];  Service: Endoscopy;  Laterality: N/A;  Diabetic - oral meds sleep apnea   EYE SURGERY     FRACTURE SURGERY Left    elbow   KNEE SURGERY  1998   plate after fracture, then removed for infection   left elbow surgery     MASTOIDECTOMY  8/08   Dr [name]  PACEMAKER IMPLANT N/A 09/02/2021   Procedure: PACEMAKER IMPLANT;  Surgeon: [name], MD;  Location: [facility] INVASIVE CV LAB;  Service: Cardiovascular;  Laterality: N/A;   RHINOPLASTY  5/10   and septoplasty   SUBACROMIAL DECOMPRESSION Right 2005   Arthroscopic (for rotator cuff and biceps tendon ruptures)   TEAR DUCT PROBING  11/13   Dr [name]   TOTAL KNEE ARTHROPLASTY Left 09/01/2014   Procedure: TOTAL KNEE ARTHROPLASTY;  Surgeon: [name], MD;  Location: [facility] ORS;  Service: Orthopedics;  Laterality: Left;   TRIGGER FINGER RELEASE Left 02/25/2015   Procedure: LEFT LONG TRIGGER RELEASE;  Surgeon: [name], MD;   Location: [facility] ORS;  Service: Orthopedics;  Laterality: Left;   VENOUS ABLATION      Allergies  Allergen Reactions   Lovenox [Enoxaparin] Hives   Proscar [Finasteride] Other (See Comments)    Upper body and arm weakness   Latex Rash    Has trouble with BANDAIDS that have been left on for more than 24 hours. Prefers paper tape.   Nickel Rash    Localized rash   Percocet [Oxycodone-Acetaminophen] Rash   Tape Rash and Other (See Comments)    Sensitivity     Outpatient Encounter Medications as of 05/03/2022  Medication Sig   acetaminophen (TYLENOL) 500 MG tablet Take 1,000 mg by mouth every 8 (eight) hours as needed.   apixaban (ELIQUIS) 2.5 MG TABS tablet Take 2.5 mg by mouth 2 (two) times daily.   APPLE CIDER VINEGAR PO Take 1 capsule by mouth at bedtime.   atorvastatin (LIPITOR) 80 MG tablet Take 1 tablet (80 mg total) by mouth daily.   Cranberry 250 MG TABS Take 1 tablet by mouth 2 (two) times daily.   Cyanocobalamin (B-12 PO) Take 1,000 mg by mouth daily.   finasteride (PROSCAR) 5 MG tablet Take 1 tablet (5 mg total) by mouth daily.   guaiFENesin (TUSSIN PO) Take 10 mLs by mouth every 4 (four) hours as needed.   lactose free nutrition (BOOST) LIQD Take 237 mLs by mouth 3 (three) times daily between meals.   lidocaine (LIDODERM) 5 % Place 1 patch onto the skin daily. Remove & Discard patch within 12 hours or as directed by MD   Magnesium Oxide 400 MG CAPS Take 1 capsule (400 mg total) by mouth daily.   metFORMIN (GLUCOPHAGE) 500 MG tablet TAKE 1 TABLET BY MOUTH  TWICE DAILY WITH MEALS   mirtazapine (REMERON) 7.5 MG tablet Take 1 tablet (7.5 mg total) by mouth at bedtime.   Polyethyl Glycol-Propyl Glycol (SYSTANE ULTRA OP) Place 1 drop into both eyes daily as needed (dry eyes).   polyethylene glycol (MIRALAX / GLYCOLAX) 17 g packet Take 17 g by mouth. Daily every Monday, Wednesday and Friday.   senna (SENOKOT) 8.6 MG TABS tablet Take 1 tablet (8.6 mg total) by mouth daily.    sertraline (ZOLOFT) 50 MG tablet Take 50 mg by mouth daily.   silver sulfADIAZINE (SILVADENE) 1 % cream Apply 1 Application topically daily.   sodium chloride (OCEAN) 0.65 % SOLN nasal spray Place 2 sprays into both nostrils 2 (two) times daily as needed for congestion.   tamsulosin (FLOMAX) 0.4 MG CAPS capsule Take 2 capsules (0.8 mg total) by mouth at bedtime.   Wheat Dextrin (BENEFIBER) POWD Give 2 tsp by mouth in the morning for Bowel Regulation mix in 8oz liquid of resident's choice   No facility-administered encounter medications on file as of 05/03/2022.    Review of  Systems  Constitutional:  Negative for activity change, appetite change, fatigue and unexpected weight change.  HENT:  Negative for congestion and hearing loss.   Eyes: Negative.   Respiratory:  Negative for cough and shortness of breath.   Cardiovascular:  Negative for chest pain, palpitations and leg swelling.  Gastrointestinal:  Negative for abdominal pain, constipation and diarrhea.  Genitourinary:  Negative for difficulty urinating and dysuria.  Musculoskeletal:  Negative for arthralgias and myalgias.  Skin:  Negative for color change and wound.  Neurological:  Negative for dizziness and weakness.  Psychiatric/Behavioral:  Negative for agitation, behavioral problems and confusion.      Immunization History  Administered Date(s) Administered   Fluad Quad(high Dose 65+) 09/14/2018   H1N1 12/13/2007   Influenza Split 09/11/2010   Influenza Whole 09/12/2011   Influenza, High Dose Seasonal PF 09/20/2012, 09/07/2017   Influenza, Seasonal, Injecte, Preservative Fre 09/08/2007, 09/26/2008, 10/02/2014, 09/18/2015   Influenza,inj,Quad PF,6+ Mos 09/19/2013, 09/21/2015   Influenza-Unspecified 09/07/2017, 09/11/2019, 09/03/2020, 10/19/2021   Moderna Sars-Covid-2 Vaccination 01/18/2019, 02/15/2019, 10/17/2020, 06/01/2021   Pneumococcal Conjugate-13 09/19/2013   Pneumococcal Polysaccharide-23 09/26/2003, 06/28/2016   Td  08/04/1998   Tdap 06/04/2010, 08/15/2021   Tetanus 08/04/1998   Unspecified SARS-COV-2 Vaccination 06/01/2021   Zoster, Live 01/16/2004   Pertinent  Health Maintenance Due  Topic Date Due   HEMOGLOBIN A1C  08/25/2021   OPHTHALMOLOGY EXAM  04/17/2022   INFLUENZA VACCINE  08/04/2022   FOOT EXAM  08/20/2022      12/05/2021    8:00 AM 12/05/2021   10:38 PM 12/06/2021    7:05 AM 12/06/2021    7:40 PM 12/07/2021    8:45 AM  Fall Risk  (RETIRED) Patient Fall Risk Level High fall risk Moderate fall risk High fall risk High fall risk High fall risk   Functional Status Survey:    Vitals:   05/03/22 1059  BP: 118/73  Pulse: 98  Weight: 168 lb (76.2 kg)  Height: 5\' 10"  (1.778 m)   Body mass index is 24.11 kg/m. Physical Exam Constitutional:      General: He is not in acute distress.    Appearance: He is well-developed. He is not diaphoretic.  HENT:     Head: Normocephalic and atraumatic.     Right Ear: External ear normal.     Left Ear: External ear normal.     Mouth/Throat:     Pharynx: No oropharyngeal exudate.  Eyes:     Conjunctiva/sclera: Conjunctivae normal.     Pupils: Pupils are equal, round, and reactive to light.  Cardiovascular:     Rate and Rhythm: Normal rate and regular rhythm.     Heart sounds: Normal heart sounds.  Pulmonary:     Effort: Pulmonary effort is normal.     Breath sounds: Normal breath sounds.  Abdominal:     General: Bowel sounds are normal.     Palpations: Abdomen is soft.  Musculoskeletal:     Cervical back: Normal range of motion and neck supple.     Right knee: Tenderness present.     Right lower leg: No edema.     Left lower leg: No edema.  Skin:    General: Skin is warm and dry.  Neurological:     Mental Status: He is alert and oriented to person, place, and time.     Labs reviewed: Recent Labs    09/02/21 0311 09/03/21 0457 09/16/21 0949 12/04/21 0118 12/04/21 0500 12/06/21 0348 02/08/22 1438 02/14/22 0000  NA 138 137   --    < >  136 132* 134* 138  K 3.6 3.9  --    < > 3.6 3.7 3.9 3.5  CL 109 105  --    < > 105 103 96* 103  CO2 25 25  --    < > 25 22 24  31*  GLUCOSE 125* 112*  --    < > 164* 166* 281*  --   BUN 26* 22  --    < > 25* 26* 50* 37*  CREATININE 0.91 0.92  --    < > 0.79 0.79 1.20 1.0  CALCIUM 10.0 10.5*  --    < > 9.8 9.8 10.0 9.8  MG 1.5* 1.6* 1.6  --   --   --   --   --    < > = values in this interval not displayed.   Recent Labs    02/08/22 1438  AST 42*  ALT 25  ALKPHOS 77  BILITOT 1.0  PROT 6.6  ALBUMIN 2.9*   Recent Labs    12/06/21 0348 12/07/21 0623 02/08/22 1438 02/08/22 1438 02/14/22 0000 02/20/22 0000 02/24/22 0000 03/17/22 0000 03/24/22 0000  WBC 11.0* 10.6* 11.9*   < > 5.4 6.4 6.9 8.4 9.3  NEUTROABS  --   --   --   --  3,440.00 4,736.00 4,720.00  --   --   HGB 10.4* 10.7* 12.5*  --  11.5* 10.7* 10.7* 9.5* 10.0*  HCT 30.8* 30.9* 39.0  --  34* 32* 33* 29* 30*  MCV 93.1 93.1 94.2  --   --   --   --   --   --   PLT 138* 139* 189  --  110* 143* 185 152 176   < > = values in this interval not displayed.   Lab Results  Component Value Date   TSH 0.76 12/11/2012   Lab Results  Component Value Date   HGBA1C 7.6 (H) 02/25/2021   Lab Results  Component Value Date   CHOL 131 02/25/2021   HDL 70.00 02/25/2021   LDLCALC 47 02/25/2021   TRIG 67.0 02/25/2021   CHOLHDL 2 02/25/2021    Significant Diagnostic Results in last 30 days:  No results found.  Assessment/Plan 1. Constipation, unspecified constipation type -controlled on current regimen.   2. BPH with obstruction/lower urinary tract symptoms -stable, continues on flomax and proscar  3. Atrial flutter, unspecified type (HCC) -rate controlled, continues on eliquis for anticoagulation.   4. Type 2 diabetes, controlled, with neuropathy (HCC) -blood sugars well controlled, will need A1c with next blood work Continues on metformin BID Encouraged dietary compliance, routine foot care/monitoring and to  keep up with diabetic eye exams through ophthalmology   5. Primary osteoarthritis of right knee -to follow up with orthopedic, last injection was January. Continue tylenol PRN  6. Moderate protein-calorie malnutrition (HCC) Eating better, weight has been stable. Continue supplements.   7. Mood disorder (HCC) Improved on remeron and zoloft   8.hyperlipidemia Continues on lipitor, follow up lipid next lab day.     Janene Harvey. Biagio Borg [facility] [phone]

## 2022-05-05 DIAGNOSIS — E119 Type 2 diabetes mellitus without complications: Secondary | ICD-10-CM | POA: Diagnosis not present

## 2022-05-05 DIAGNOSIS — E785 Hyperlipidemia, unspecified: Secondary | ICD-10-CM | POA: Diagnosis not present

## 2022-05-05 LAB — LIPID PANEL
Cholesterol: 99 (ref 0–200)
HDL: 48 (ref 35–70)
LDL Cholesterol: 37
Triglycerides: 48 (ref 40–160)

## 2022-05-05 LAB — HEMOGLOBIN A1C: Hemoglobin A1C: 7.1

## 2022-05-07 NOTE — Progress Notes (Signed)
Date:  05/09/2022   ID:  Michael Colon, DOB 12/18/1930, MRN [id number]  Patient Location:  [address] [place] [address]   Provider location:   [facility], [place] office  PCP:  [name], MD  Cardiologist:  Fonnie Mu  Chief Complaint  Patient presents with   6 month follow up     "Doing well." Medications reviewed by the patient's medication list from [facility].     History of Present Illness:    Michael Colon is a 89 y.o. male  past medical history of CT calcium score 1300,  atrial flutter (3/14), previous ablation , on warfarin,  hypertension,  borderline diabetes,  Obstructive sleep apnea  [facility] on August 04, 2010 with chest pain.  improvement in symptoms with nitroglycerin and GI cocktail in the emergency room.  reports having a TIA 2014, workup negative at [facility] Previous vein ablation surgery at [facility] diabetes type 2 S/p  right great saphenous vein and small saphenous vein laser ablation.  Pacemaker for complete heart block Frequent falls He presents today for follow-up of his atrial flutter, hyperlipidemia  Last seen by myself in clinic 11/23  Fall 11/23, broke elbow/closed fracture of right humerus At [facility] rehab Chronic Limited range of mobility left arm  Recently treated for UTI, Foley placed In the emergency room for rectal bleeding February 2024  Reports he continues to have difficulty standing, legs weak Can walk with walker, unsteady  Denies chest pain concerning for angina, no shortness of breath, no significant leg swelling  EKG personally reviewed by myself on todays visit Normal sinus rhythm rate 91 bpm PVCs  Other past medical history reviewed  seen in the ED on 08/15/2021 after sustaining a mechanical fall, losing his balance while going down the stairs and hitting his head on a piece of furniture with a sharp corner.  He had a laceration to the head.  His laceration was repaired with staples.    CT head showed no acute intracranial findings.   CT cervical spine showed no recent fracture.    EKG concerning for AV conduction delay  Seen in the office September 01, 2021, complete heart block noted Was transferred to [facility], pacemaker placed September 02, 2021  Labs A1c 7.6 Total chol 110, LDL 36  echocardiogram October 2021 Normal ejection fraction asymmetric left ventricular  hypertrophy of the basal-septal segment Aorta 38 mm Mildly elevated right heart pressures  Lab work reviewed most recent creatinine 0.94 BUN 31, improvement from September BUN 42 creatinine 1.01  Chronic leg swelling worse on the left leg than the right secondary to previous knee surgeries  Previously evaluated for bradycardia. Pacemaker was not recommended at that time. Previously seen by Dr. Graciela Husbands, EP  Previous Holter monitor showed normal sinus rhythm with pauses up to 2.86 seconds, bradycardia with heart rates in the 20s to 30s at nighttime, 30s to 50 during the daytime.  Hemoglobin A1c in December 2014 7.5, total cholesterol up from 132 now 200  CT calcium score 1300,  stress test 04/2015 Following the CT scan, no ischemia    Past Medical History:  Diagnosis Date   Asthma    Atrial flutter (HCC) 2007   Band keratopathy    Benign prostatic hypertrophy    Chronic ear infection    Chronic kidney disease    Chronic prostatitis    Dental bridge present    permanent - upper   Diabetes mellitus  Elbow stiffness, left    s/p fracture many yrs ago.  arm does not straighten   GERD (gastroesophageal reflux disease)    laryngeal involvement   Hyperlipidemia    Hypertension    Obstructive sleep apnea    CPAP-9   Osteoarthrosis, localized, primary, knee    post-traumatic   Prostatitis    Stroke (HCC)    TIA   Tachy-brady syndrome (HCC)    UTI (urinary tract infection)    Past Surgical History:  Procedure Laterality Date   APPENDECTOMY     BELPHAROPTOSIS REPAIR     Dr [name]---didn't  resolve weepy eye and eyelid drooping   CARDIOVERSION  04/13/2010   CATARACT EXTRACTION, BILATERAL  2009   Chest pain  8/12   Stress test benign   ESOPHAGEAL DILATION  05/26/2016   Procedure: ESOPHAGEAL DILATION;  Surgeon: [name], MD;  Location: [facility];  Service: Endoscopy;;   ESOPHAGOGASTRODUODENOSCOPY (EGD) WITH PROPOFOL N/A 05/26/2016   Procedure: ESOPHAGOGASTRODUODENOSCOPY (EGD) WITH PROPOFOL;  Surgeon: [name], MD;  Location: [facility];  Service: Endoscopy;  Laterality: N/A;  Diabetic - oral meds sleep apnea   EYE SURGERY     FRACTURE SURGERY Left    elbow   KNEE SURGERY  1998   plate after fracture, then removed for infection   left elbow surgery     MASTOIDECTOMY  8/08   Dr [name]   PACEMAKER IMPLANT N/A 09/02/2021   Procedure: PACEMAKER IMPLANT;  Surgeon: [name], MD;  Location: [facility] INVASIVE CV LAB;  Service: Cardiovascular;  Laterality: N/A;   RHINOPLASTY  5/10   and septoplasty   SUBACROMIAL DECOMPRESSION Right 2005   Arthroscopic (for rotator cuff and biceps tendon ruptures)   TEAR DUCT PROBING  11/13   Dr [name]   TOTAL KNEE ARTHROPLASTY Left 09/01/2014   Procedure: TOTAL KNEE ARTHROPLASTY;  Surgeon: [name], MD;  Location: [facility] ORS;  Service: Orthopedics;  Laterality: Left;   TRIGGER FINGER RELEASE Left 02/25/2015   Procedure: LEFT LONG TRIGGER RELEASE;  Surgeon: [name], MD;  Location: [facility] ORS;  Service: Orthopedics;  Laterality: Left;   VENOUS ABLATION       Current Outpatient Medications on File Prior to Visit  Medication Sig Dispense Refill   acetaminophen (TYLENOL) 500 MG tablet Take 1,000 mg by mouth every 8 (eight) hours as needed.     apixaban (ELIQUIS) 2.5 MG TABS tablet Take 2.5 mg by mouth 2 (two) times daily.     APPLE CIDER VINEGAR PO Take 1 capsule by mouth at bedtime.     atorvastatin (LIPITOR) 80 MG tablet Take 1 tablet (80 mg total) by mouth daily. 90 tablet 3   Cranberry 250 MG TABS Take 1  tablet by mouth 2 (two) times daily.     Cyanocobalamin (B-12 PO) Take 1,000 mg by mouth daily.     finasteride (PROSCAR) 5 MG tablet Take 1 tablet (5 mg total) by mouth daily. 90 tablet 3   guaiFENesin (TUSSIN PO) Take 10 mLs by mouth every 4 (four) hours as needed.     lactose free nutrition (BOOST) LIQD Take 237 mLs by mouth 3 (three) times daily between meals.     lidocaine (LIDODERM) 5 % Place 1 patch onto the skin daily. Remove & Discard patch within 12 hours or as directed by MD 30 patch 0   Magnesium Oxide 400 MG CAPS Take 1 capsule (400 mg total) by mouth daily. 90 capsule 3   metFORMIN (GLUCOPHAGE)  500 MG tablet TAKE 1 TABLET BY MOUTH  TWICE DAILY WITH MEALS 180 tablet 3   mirtazapine (REMERON) 7.5 MG tablet Take 1 tablet (7.5 mg total) by mouth at bedtime.     Polyethyl Glycol-Propyl Glycol (SYSTANE ULTRA OP) Place 1 drop into both eyes daily as needed (dry eyes).     polyethylene glycol (MIRALAX / GLYCOLAX) 17 g packet Take 17 g by mouth. Daily every Monday, Wednesday and Friday.     senna (SENOKOT) 8.6 MG TABS tablet Take 1 tablet (8.6 mg total) by mouth daily. 120 tablet 0   sertraline (ZOLOFT) 50 MG tablet Take 50 mg by mouth daily.     silver sulfADIAZINE (SILVADENE) 1 % cream Apply 1 Application topically daily. 50 g 0   sodium chloride (OCEAN) 0.65 % SOLN nasal spray Place 2 sprays into both nostrils 2 (two) times daily as needed for congestion.     tamsulosin (FLOMAX) 0.4 MG CAPS capsule Take 2 capsules (0.8 mg total) by mouth at bedtime. 90 capsule 6   Wheat Dextrin (BENEFIBER) POWD Give 2 tsp by mouth in the morning for Bowel Regulation mix in 8oz liquid of resident's choice     No current facility-administered medications on file prior to visit.     Allergies:   Lovenox [enoxaparin], Proscar [finasteride], Latex, Nickel, Percocet [oxycodone-acetaminophen], and Tape   Social History   Tobacco Use   Smoking status: Former    Packs/day: 1.00    Years: 0.00     Additional pack years: 0.00    Total pack years: 0.00    Types: Cigarettes    Quit date: 01/03/1969    Years since quitting: 53.3    Passive exposure: Never   Smokeless tobacco: Never  Vaping Use   Vaping Use: Never used  Substance Use Topics   Alcohol use: No    Alcohol/week: 1.0 standard drink of alcohol    Types: 1 Glasses of wine per week    Comment: rare wine. 1-2x/yr.   Drug use: No     Family Hx: The patient's family history includes Colon cancer in his father; Diabetes in his father; Hypertension in his father; Other in his mother. There is no history of Heart attack or Stroke.  ROS:   Please see the history of present illness.    Review of Systems  Constitutional: Negative.   HENT: Negative.    Respiratory: Negative.    Cardiovascular: Negative.   Gastrointestinal: Negative.   Musculoskeletal:  Positive for falls and joint pain.  Neurological: Negative.   Psychiatric/Behavioral: Negative.    All other systems reviewed and are negative.    Labs/Other Tests and Data Reviewed:    Recent Labs: 09/16/2021: Magnesium 1.6 12/04/2021: B Natriuretic Peptide 228.0 02/08/2022: ALT 25 02/14/2022: BUN 37; Creatinine 1.0; Potassium 3.5; Sodium 138 03/24/2022: Hemoglobin 10.0; Platelets 176   Recent Lipid Panel Lab Results  Component Value Date/Time   CHOL 131 02/25/2021 03:07 PM   TRIG 67.0 02/25/2021 03:07 PM   HDL 70.00 02/25/2021 03:07 PM   CHOLHDL 2 02/25/2021 03:07 PM   LDLCALC 47 02/25/2021 03:07 PM    Wt Readings from Last 3 Encounters:  05/03/22 168 lb (76.2 kg)  04/19/22 170 lb (77.1 kg)  04/11/22 173 lb (78.5 kg)     Exam:    BP (!) 118/52 (BP Location: Left Arm, Patient Position: Sitting, Cuff Size: Normal)   Pulse 91   Ht 5\' 10"  (1.778 m)   SpO2 96%   BMI 24.11  kg/m  Constitutional:  oriented to person, place, and time. No distress.  HENT:  Head: Grossly normal Eyes:  no discharge. No scleral icterus.  Neck: No JVD, no carotid bruits   Cardiovascular: Regular rate and rhythm, no murmurs appreciated Pulmonary/Chest: Clear to auscultation bilaterally, no wheezes or rails Abdominal: Soft.  no distension.  no tenderness.  Musculoskeletal: Normal range of motion Neurological:  normal muscle tone. Coordination normal. No atrophy Skin: Skin warm and dry Psychiatric: normal affect, pleasant   ASSESSMENT & PLAN:   Frequent falls mechanical fall August 2023 with head laceration, Fall Colon 2023 with fracture to elbow Long recovery, living at [facility], and performing rehab Difficulty standing from a sitting position without assistance  Atrial fibrillation/flutter On anticoagulation, Eliquis  pacemaker for complete heart block, followed by EP  Chronic diastolic CHF Appears euvolemic, currently not on diuretics  Complete heart block Pacer placed by Dr. Lalla Brothers  Mixed hyperlipidemia Cholesterol is at goal on the current lipid regimen. No changes to the medications were made.  Essential hypertension Blood pressure is well controlled on today's visit. No changes made to the medications.  Type 2 diabetes, controlled, with neuropathy (HCC) Managed by primary care  Aortic atherosclerosis (HCC) Cholesterol is at goal on the current lipid regimen. No changes to the medications were made.  Obstructive sleep apnea On cpap, weight stable Followed by pulmonary   Total encounter time more than 30 minutes  Greater than 50% was spent in counseling and coordination of care with the patient   Signed, Julien Nordmann, MD  05/09/2022 2:04 PM    [facility] [address], [place], [place] [address]

## 2022-05-09 ENCOUNTER — Ambulatory Visit: Payer: Medicare Other | Attending: Cardiovascular Disease | Admitting: Cardiovascular Disease

## 2022-05-09 ENCOUNTER — Encounter: Payer: Self-pay | Admitting: Cardiovascular Disease

## 2022-05-09 VITALS — BP 118/52 | HR 91 | Ht 70.0 in

## 2022-05-09 DIAGNOSIS — M7022 Olecranon bursitis, left elbow: Secondary | ICD-10-CM | POA: Diagnosis not present

## 2022-05-09 DIAGNOSIS — I700 Atherosclerosis of aorta: Secondary | ICD-10-CM

## 2022-05-09 DIAGNOSIS — Z950 Presence of cardiac pacemaker: Secondary | ICD-10-CM | POA: Diagnosis not present

## 2022-05-09 DIAGNOSIS — I442 Atrioventricular block, complete: Secondary | ICD-10-CM

## 2022-05-09 DIAGNOSIS — I5032 Chronic diastolic (congestive) heart failure: Secondary | ICD-10-CM

## 2022-05-09 DIAGNOSIS — Z9181 History of falling: Secondary | ICD-10-CM | POA: Diagnosis not present

## 2022-05-09 DIAGNOSIS — E782 Mixed hyperlipidemia: Secondary | ICD-10-CM | POA: Diagnosis not present

## 2022-05-09 DIAGNOSIS — I4892 Unspecified atrial flutter: Secondary | ICD-10-CM

## 2022-05-09 DIAGNOSIS — I2510 Atherosclerotic heart disease of native coronary artery without angina pectoris: Secondary | ICD-10-CM | POA: Diagnosis not present

## 2022-05-09 DIAGNOSIS — E1140 Type 2 diabetes mellitus with diabetic neuropathy, unspecified: Secondary | ICD-10-CM | POA: Diagnosis not present

## 2022-05-09 DIAGNOSIS — I10 Essential (primary) hypertension: Secondary | ICD-10-CM

## 2022-05-09 DIAGNOSIS — R262 Difficulty in walking, not elsewhere classified: Secondary | ICD-10-CM | POA: Diagnosis not present

## 2022-05-09 DIAGNOSIS — S42201D Unspecified fracture of upper end of right humerus, subsequent encounter for fracture with routine healing: Secondary | ICD-10-CM | POA: Diagnosis not present

## 2022-05-09 DIAGNOSIS — I77810 Thoracic aortic ectasia: Secondary | ICD-10-CM | POA: Diagnosis not present

## 2022-05-09 DIAGNOSIS — R2689 Other abnormalities of gait and mobility: Secondary | ICD-10-CM | POA: Diagnosis not present

## 2022-05-09 DIAGNOSIS — D696 Thrombocytopenia, unspecified: Secondary | ICD-10-CM | POA: Diagnosis not present

## 2022-05-09 DIAGNOSIS — R2681 Unsteadiness on feet: Secondary | ICD-10-CM | POA: Diagnosis not present

## 2022-05-09 DIAGNOSIS — R278 Other lack of coordination: Secondary | ICD-10-CM | POA: Diagnosis not present

## 2022-05-09 DIAGNOSIS — I495 Sick sinus syndrome: Secondary | ICD-10-CM

## 2022-05-09 DIAGNOSIS — M6281 Muscle weakness (generalized): Secondary | ICD-10-CM | POA: Diagnosis not present

## 2022-05-09 DIAGNOSIS — S42402D Unspecified fracture of lower end of left humerus, subsequent encounter for fracture with routine healing: Secondary | ICD-10-CM | POA: Diagnosis not present

## 2022-05-09 DIAGNOSIS — M19122 Post-traumatic osteoarthritis, left elbow: Secondary | ICD-10-CM | POA: Diagnosis not present

## 2022-05-09 NOTE — Patient Instructions (Signed)
Medication Instructions:  Your physician recommends that you continue on your current medications as directed. Please refer to the Current Medication list given to you today.  *If you need a refill on your cardiac medications before your next appointment, please call your pharmacy*   Lab Work: No labs ordered  If you have labs (blood work) drawn today and your tests are completely normal, you will receive your results only by: MyChart Message (if you have MyChart) OR A paper copy in the mail If you have any lab test that is abnormal or we need to change your treatment, we will call you to review the results.   Testing/Procedures: No testing ordered  Follow-Up: At [facility], you and your health needs are our priority.  As part of our continuing mission to provide you with exceptional heart care, we have created designated Provider Care Teams.  These Care Teams include your primary Cardiologist (physician) and Advanced Practice Providers (APPs -  Physician Assistants and Nurse Practitioners) who all work together to provide you with the care you need, when you need it.  We recommend signing up for the patient portal called "MyChart".  Sign up information is provided on this After Visit Summary.  MyChart is used to connect with patients for Virtual Visits (Telemedicine).  Patients are able to view lab/test results, encounter notes, upcoming appointments, etc.  Non-urgent messages can be sent to your provider as well.   To learn more about what you can do with MyChart, go to [email or web address].    Your next appointment:   1 year(s)  Provider:   You may see Timothy Gollan, MD or one of the following Advanced Practice Providers on your designated Care Team:   Christopher Berge, NP Ryan Dunn, PA-C Cadence Furth, PA-C Sheri Hammock, NP  

## 2022-05-12 DIAGNOSIS — M7022 Olecranon bursitis, left elbow: Secondary | ICD-10-CM | POA: Diagnosis not present

## 2022-05-12 DIAGNOSIS — Z950 Presence of cardiac pacemaker: Secondary | ICD-10-CM | POA: Diagnosis not present

## 2022-05-12 DIAGNOSIS — M19122 Post-traumatic osteoarthritis, left elbow: Secondary | ICD-10-CM | POA: Diagnosis not present

## 2022-05-12 DIAGNOSIS — R2681 Unsteadiness on feet: Secondary | ICD-10-CM | POA: Diagnosis not present

## 2022-05-12 DIAGNOSIS — Z0189 Encounter for other specified special examinations: Secondary | ICD-10-CM | POA: Diagnosis not present

## 2022-05-12 DIAGNOSIS — R2689 Other abnormalities of gait and mobility: Secondary | ICD-10-CM | POA: Diagnosis not present

## 2022-05-12 DIAGNOSIS — I442 Atrioventricular block, complete: Secondary | ICD-10-CM | POA: Diagnosis not present

## 2022-05-12 DIAGNOSIS — R262 Difficulty in walking, not elsewhere classified: Secondary | ICD-10-CM | POA: Diagnosis not present

## 2022-05-12 DIAGNOSIS — I495 Sick sinus syndrome: Secondary | ICD-10-CM | POA: Diagnosis not present

## 2022-05-12 DIAGNOSIS — D696 Thrombocytopenia, unspecified: Secondary | ICD-10-CM | POA: Diagnosis not present

## 2022-05-12 DIAGNOSIS — S42402D Unspecified fracture of lower end of left humerus, subsequent encounter for fracture with routine healing: Secondary | ICD-10-CM | POA: Diagnosis not present

## 2022-05-12 DIAGNOSIS — Z9181 History of falling: Secondary | ICD-10-CM | POA: Diagnosis not present

## 2022-05-12 DIAGNOSIS — I10 Essential (primary) hypertension: Secondary | ICD-10-CM | POA: Diagnosis not present

## 2022-05-12 DIAGNOSIS — M6281 Muscle weakness (generalized): Secondary | ICD-10-CM | POA: Diagnosis not present

## 2022-05-12 DIAGNOSIS — R278 Other lack of coordination: Secondary | ICD-10-CM | POA: Diagnosis not present

## 2022-05-12 DIAGNOSIS — M25461 Effusion, right knee: Secondary | ICD-10-CM | POA: Diagnosis not present

## 2022-05-12 DIAGNOSIS — M1711 Unilateral primary osteoarthritis, right knee: Secondary | ICD-10-CM | POA: Diagnosis not present

## 2022-05-12 DIAGNOSIS — G8929 Other chronic pain: Secondary | ICD-10-CM | POA: Diagnosis not present

## 2022-05-12 DIAGNOSIS — E1140 Type 2 diabetes mellitus with diabetic neuropathy, unspecified: Secondary | ICD-10-CM | POA: Diagnosis not present

## 2022-05-12 DIAGNOSIS — S42201D Unspecified fracture of upper end of right humerus, subsequent encounter for fracture with routine healing: Secondary | ICD-10-CM | POA: Diagnosis not present

## 2022-05-12 LAB — CBC: RBC: 3.35 — AB (ref 3.87–5.11)

## 2022-05-12 LAB — CBC AND DIFFERENTIAL
HCT: 32 — AB (ref 41–53)
Hemoglobin: 10.6 — AB (ref 13.5–17.5)
Platelets: 144 10*3/uL — AB (ref 150–400)
WBC: 6.9

## 2022-05-12 LAB — HEPATIC FUNCTION PANEL
ALT: 12 U/L (ref 10–40)
AST: 13 — AB (ref 14–40)
Alkaline Phosphatase: 55 (ref 25–125)
Bilirubin, Total: 0.4

## 2022-05-12 LAB — COMPREHENSIVE METABOLIC PANEL
Albumin: 3.1 — AB (ref 3.5–5.0)
Calcium: 10.3 (ref 8.7–10.7)
Globulin: 2.4
eGFR: 87

## 2022-05-12 LAB — BASIC METABOLIC PANEL
BUN: 26 — AB (ref 4–21)
CO2: 30 — AB (ref 13–22)
Chloride: 103 (ref 99–108)
Creatinine: 0.7 (ref 0.6–1.3)
Glucose: 109
Potassium: 3.9 mEq/L (ref 3.5–5.1)
Sodium: 138 (ref 137–147)

## 2022-05-13 DIAGNOSIS — S42402D Unspecified fracture of lower end of left humerus, subsequent encounter for fracture with routine healing: Secondary | ICD-10-CM | POA: Diagnosis not present

## 2022-05-13 DIAGNOSIS — I442 Atrioventricular block, complete: Secondary | ICD-10-CM | POA: Diagnosis not present

## 2022-05-13 DIAGNOSIS — S42201D Unspecified fracture of upper end of right humerus, subsequent encounter for fracture with routine healing: Secondary | ICD-10-CM | POA: Diagnosis not present

## 2022-05-13 DIAGNOSIS — R2681 Unsteadiness on feet: Secondary | ICD-10-CM | POA: Diagnosis not present

## 2022-05-13 DIAGNOSIS — M19122 Post-traumatic osteoarthritis, left elbow: Secondary | ICD-10-CM | POA: Diagnosis not present

## 2022-05-13 DIAGNOSIS — R278 Other lack of coordination: Secondary | ICD-10-CM | POA: Diagnosis not present

## 2022-05-13 DIAGNOSIS — M6281 Muscle weakness (generalized): Secondary | ICD-10-CM | POA: Diagnosis not present

## 2022-05-13 DIAGNOSIS — R262 Difficulty in walking, not elsewhere classified: Secondary | ICD-10-CM | POA: Diagnosis not present

## 2022-05-13 DIAGNOSIS — M7022 Olecranon bursitis, left elbow: Secondary | ICD-10-CM | POA: Diagnosis not present

## 2022-05-13 DIAGNOSIS — Z950 Presence of cardiac pacemaker: Secondary | ICD-10-CM | POA: Diagnosis not present

## 2022-05-13 DIAGNOSIS — I495 Sick sinus syndrome: Secondary | ICD-10-CM | POA: Diagnosis not present

## 2022-05-13 DIAGNOSIS — Z9181 History of falling: Secondary | ICD-10-CM | POA: Diagnosis not present

## 2022-05-13 DIAGNOSIS — E1140 Type 2 diabetes mellitus with diabetic neuropathy, unspecified: Secondary | ICD-10-CM | POA: Diagnosis not present

## 2022-05-13 DIAGNOSIS — R2689 Other abnormalities of gait and mobility: Secondary | ICD-10-CM | POA: Diagnosis not present

## 2022-05-13 DIAGNOSIS — D696 Thrombocytopenia, unspecified: Secondary | ICD-10-CM | POA: Diagnosis not present

## 2022-05-14 DIAGNOSIS — R262 Difficulty in walking, not elsewhere classified: Secondary | ICD-10-CM | POA: Diagnosis not present

## 2022-05-14 DIAGNOSIS — R278 Other lack of coordination: Secondary | ICD-10-CM | POA: Diagnosis not present

## 2022-05-14 DIAGNOSIS — M6281 Muscle weakness (generalized): Secondary | ICD-10-CM | POA: Diagnosis not present

## 2022-05-14 DIAGNOSIS — E1140 Type 2 diabetes mellitus with diabetic neuropathy, unspecified: Secondary | ICD-10-CM | POA: Diagnosis not present

## 2022-05-14 DIAGNOSIS — D696 Thrombocytopenia, unspecified: Secondary | ICD-10-CM | POA: Diagnosis not present

## 2022-05-14 DIAGNOSIS — M7022 Olecranon bursitis, left elbow: Secondary | ICD-10-CM | POA: Diagnosis not present

## 2022-05-14 DIAGNOSIS — S42402D Unspecified fracture of lower end of left humerus, subsequent encounter for fracture with routine healing: Secondary | ICD-10-CM | POA: Diagnosis not present

## 2022-05-14 DIAGNOSIS — Z950 Presence of cardiac pacemaker: Secondary | ICD-10-CM | POA: Diagnosis not present

## 2022-05-14 DIAGNOSIS — Z9181 History of falling: Secondary | ICD-10-CM | POA: Diagnosis not present

## 2022-05-14 DIAGNOSIS — M19122 Post-traumatic osteoarthritis, left elbow: Secondary | ICD-10-CM | POA: Diagnosis not present

## 2022-05-14 DIAGNOSIS — R2681 Unsteadiness on feet: Secondary | ICD-10-CM | POA: Diagnosis not present

## 2022-05-14 DIAGNOSIS — I495 Sick sinus syndrome: Secondary | ICD-10-CM | POA: Diagnosis not present

## 2022-05-14 DIAGNOSIS — I442 Atrioventricular block, complete: Secondary | ICD-10-CM | POA: Diagnosis not present

## 2022-05-14 DIAGNOSIS — S42201D Unspecified fracture of upper end of right humerus, subsequent encounter for fracture with routine healing: Secondary | ICD-10-CM | POA: Diagnosis not present

## 2022-05-14 DIAGNOSIS — R2689 Other abnormalities of gait and mobility: Secondary | ICD-10-CM | POA: Diagnosis not present

## 2022-05-15 ENCOUNTER — Telehealth: Payer: Self-pay | Admitting: Orthopedic Surgery

## 2022-05-15 NOTE — Telephone Encounter (Signed)
[facility] nursing called to report left sided chest pain. Pain began last night. Pain stimulated with movement. BP 150/67, HR 87, O2 sat 98 on RA, RR 20, temp 98.2. He was given tylenol 1000 mg at 1:50 pm. It does not appear to have relieved pain at this time. Nursing asked if he wanted to go to ED and he refused. He is a DNR. He denies sob, left arm pain, N/V, diaphoresis.He reported pain after lunch for first time to nursing. Unclear if indigestion. He is not on PPI. Orders given to try Maalox. Advised to contact on call provider if symptoms do not improve in 1 hour.

## 2022-05-16 ENCOUNTER — Non-Acute Institutional Stay (SKILLED_NURSING_FACILITY): Payer: Medicare Other | Admitting: Student

## 2022-05-16 ENCOUNTER — Encounter: Payer: Self-pay | Admitting: Student

## 2022-05-16 DIAGNOSIS — M7022 Olecranon bursitis, left elbow: Secondary | ICD-10-CM | POA: Diagnosis not present

## 2022-05-16 DIAGNOSIS — M19122 Post-traumatic osteoarthritis, left elbow: Secondary | ICD-10-CM | POA: Diagnosis not present

## 2022-05-16 DIAGNOSIS — I442 Atrioventricular block, complete: Secondary | ICD-10-CM | POA: Diagnosis not present

## 2022-05-16 DIAGNOSIS — R2681 Unsteadiness on feet: Secondary | ICD-10-CM | POA: Diagnosis not present

## 2022-05-16 DIAGNOSIS — E1140 Type 2 diabetes mellitus with diabetic neuropathy, unspecified: Secondary | ICD-10-CM | POA: Diagnosis not present

## 2022-05-16 DIAGNOSIS — S42201D Unspecified fracture of upper end of right humerus, subsequent encounter for fracture with routine healing: Secondary | ICD-10-CM | POA: Diagnosis not present

## 2022-05-16 DIAGNOSIS — Z9181 History of falling: Secondary | ICD-10-CM | POA: Diagnosis not present

## 2022-05-16 DIAGNOSIS — S42402D Unspecified fracture of lower end of left humerus, subsequent encounter for fracture with routine healing: Secondary | ICD-10-CM | POA: Diagnosis not present

## 2022-05-16 DIAGNOSIS — I495 Sick sinus syndrome: Secondary | ICD-10-CM | POA: Diagnosis not present

## 2022-05-16 DIAGNOSIS — D696 Thrombocytopenia, unspecified: Secondary | ICD-10-CM | POA: Diagnosis not present

## 2022-05-16 DIAGNOSIS — Z950 Presence of cardiac pacemaker: Secondary | ICD-10-CM | POA: Diagnosis not present

## 2022-05-16 DIAGNOSIS — R278 Other lack of coordination: Secondary | ICD-10-CM | POA: Diagnosis not present

## 2022-05-16 DIAGNOSIS — M6281 Muscle weakness (generalized): Secondary | ICD-10-CM | POA: Diagnosis not present

## 2022-05-16 DIAGNOSIS — R2689 Other abnormalities of gait and mobility: Secondary | ICD-10-CM | POA: Diagnosis not present

## 2022-05-16 DIAGNOSIS — M940 Chondrocostal junction syndrome [Tietze]: Secondary | ICD-10-CM | POA: Diagnosis not present

## 2022-05-16 DIAGNOSIS — R262 Difficulty in walking, not elsewhere classified: Secondary | ICD-10-CM | POA: Diagnosis not present

## 2022-05-16 NOTE — Progress Notes (Unsigned)
Location:  Other [facility].  Nursing Home Room Number: [facility] Place of Service:  SNF [phone]) Provider:  [name], MD  Patient Care Team: [name], MD as PCP - General (Family Medicine) Antonieta Iba, MD as PCP - Cardiology (Cardiology) Antonieta Iba, MD as Consulting Physician (Cardiology)  Extended Emergency Contact Information Primary Emergency Contact: [name] [facility]) Address: [address]          [place], [place] [address] [name] of [employer] Phone: [phone] Relation: Son  Code Status:  DNR Goals of care: Advanced Directive information    05/16/2022    9:11 AM  Advanced Directives  Does Patient Have a Medical Advance Directive? Yes  Type of Estate agent of [place];Out of facility DNR (pink MOST or yellow form);Living will  Does patient want to make changes to medical advance directive? No - Patient declined  Copy of Healthcare Power of Attorney in Chart? Yes - validated most recent copy scanned in chart (See row information)     Chief Complaint  Patient presents with   Acute Visit    Arm Pain.     HPI:  Pt is a 89 y.o. male seen today for an acute visit for Arm Pain. Patient states he was reaching for something and has a bit of muscle strain in the left under arm area. He states there is a bruise underneath. He denies pain at rest, but has pain with palpation. He denies chest pain.    Past Medical History:  Diagnosis Date   Asthma    Atrial flutter (HCC) 2007   Band keratopathy    Benign prostatic hypertrophy    Chronic ear infection    Chronic kidney disease    Chronic prostatitis    Dental bridge present    permanent - upper   Diabetes mellitus    Elbow stiffness, left    s/p fracture many yrs ago.  arm does not straighten   GERD (gastroesophageal reflux disease)    laryngeal involvement   Hyperlipidemia    Hypertension    Obstructive sleep apnea    CPAP-9    Osteoarthrosis, localized, primary, knee    post-traumatic   Prostatitis    Stroke (HCC)    TIA   Tachy-brady syndrome (HCC)    UTI (urinary tract infection)    Past Surgical History:  Procedure Laterality Date   APPENDECTOMY     BELPHAROPTOSIS REPAIR     Dr [name]---didn't resolve weepy eye and eyelid drooping   CARDIOVERSION  04/13/2010   CATARACT EXTRACTION, BILATERAL  2009   Chest pain  8/12   Stress test benign   ESOPHAGEAL DILATION  05/26/2016   Procedure: ESOPHAGEAL DILATION;  Surgeon: [name], MD;  Location: [facility];  Service: Endoscopy;;   ESOPHAGOGASTRODUODENOSCOPY (EGD) WITH PROPOFOL N/A 05/26/2016   Procedure: ESOPHAGOGASTRODUODENOSCOPY (EGD) WITH PROPOFOL;  Surgeon: [name], MD;  Location: [facility];  Service: Endoscopy;  Laterality: N/A;  Diabetic - oral meds sleep apnea   EYE SURGERY     FRACTURE SURGERY Left    elbow   KNEE SURGERY  1998   plate after fracture, then removed for infection   left elbow surgery     MASTOIDECTOMY  8/08   Dr [name]   PACEMAKER IMPLANT N/A 09/02/2021   Procedure: PACEMAKER IMPLANT;  Surgeon: [name], MD;  Location: [facility] IN[facility];  Service: Cardiovascular;  Laterality: N/A;   RHINOPLASTY  5/10   and  septoplasty   SUBACROMIAL DECOMPRESSION Right 2005   Arthroscopic (for rotator cuff and biceps tendon ruptures)   TEAR DUCT PROBING  11/13   Dr [name]   TOTAL KNEE ARTHROPLASTY Left 09/01/2014   Procedure: TOTAL KNEE ARTHROPLASTY;  Surgeon: [name], MD;  Location: [facility];  Service: Orthopedics;  Laterality: Left;   TRIGGER FINGER RELEASE Left 02/25/2015   Procedure: LEFT LONG TRIGGER RELEASE;  Surgeon: [name], MD;  Location: [facility];  Service: Orthopedics;  Laterality: Left;   VENOUS ABLATION      Allergies  Allergen Reactions   Lovenox [Enoxaparin] Hives   Proscar [Finasteride] Other (See Comments)    Upper body and arm weakness   Latex Rash    Has trouble with  BANDAIDS that have been left on for more than 24 hours. Prefers paper tape.   Nickel Rash    Localized rash   Percocet [Oxycodone-Acetaminophen] Rash   Tape Rash and Other (See Comments)    Sensitivity     Outpatient Encounter Medications as of 05/16/2022  Medication Sig   acetaminophen (TYLENOL) 500 MG tablet Take 1,000 mg by mouth every 8 (eight) hours as needed.   apixaban (ELIQUIS) 2.5 MG TABS tablet Take 2.5 mg by mouth 2 (two) times daily.   APPLE CIDER VINEGAR PO Take 1 capsule by mouth at bedtime.   atorvastatin (LIPITOR) 80 MG tablet Take 1 tablet (80 mg total) by mouth daily.   Cranberry 250 MG TABS Take 1 tablet by mouth 2 (two) times daily.   Cyanocobalamin (B-12 PO) Take 1,000 mg by mouth daily.   finasteride (PROSCAR) 5 MG tablet Take 1 tablet (5 mg total) by mouth daily.   guaiFENesin (TUSSIN PO) Take 10 mLs by mouth every 4 (four) hours as needed.   lactose free nutrition (BOOST) LIQD Take 237 mLs by mouth 3 (three) times daily between meals.   lidocaine (LIDODERM) 5 % Place 1 patch onto the skin daily. Remove & Discard patch within 12 hours or as directed by MD   Magnesium Oxide 400 MG CAPS Take 1 capsule (400 mg total) by mouth daily.   metFORMIN (GLUCOPHAGE) 500 MG tablet TAKE 1 TABLET BY MOUTH  TWICE DAILY WITH MEALS   mirtazapine (REMERON) 7.5 MG tablet Take 1 tablet (7.5 mg total) by mouth at bedtime.   Polyethyl Glycol-Propyl Glycol (SYSTANE ULTRA OP) Place 1 drop into both eyes daily as needed (dry eyes).   polyethylene glycol (MIRALAX / GLYCOLAX) 17 g packet Take 17 g by mouth. Daily every Monday, Wednesday and Friday.   senna (SENOKOT) 8.6 MG TABS tablet Take 1 tablet (8.6 mg total) by mouth daily.   sertraline (ZOLOFT) 50 MG tablet Take 50 mg by mouth daily.   silver sulfADIAZINE (SILVADENE) 1 % cream Apply 1 Application topically daily.   sodium chloride (OCEAN) 0.65 % SOLN nasal spray Place 2 sprays into both nostrils 2 (two) times daily as needed for  congestion.   tamsulosin (FLOMAX) 0.4 MG CAPS capsule Take 2 capsules (0.8 mg total) by mouth at bedtime.   Wheat Dextrin (BENEFIBER) POWD Give 2 tsp by mouth in the morning for Bowel Regulation mix in 8oz liquid of resident's choice   No facility-administered encounter medications on file as of 05/16/2022.    Review of Systems  Immunization History  Administered Date(s) Administered   Fluad Quad(high Dose 65+) 09/14/2018   H1N1 12/13/2007   Influenza Split 09/11/2010   Influenza Whole 09/12/2011   Influenza, High Dose Seasonal  PF 09/20/2012, 09/07/2017   Influenza, Seasonal, Injecte, Preservative Fre 09/08/2007, 09/26/2008, 10/02/2014, 09/18/2015   Influenza,inj,Quad PF,6+ Mos 09/19/2013, 09/21/2015   Influenza-Unspecified 09/07/2017, 09/11/2019, 09/03/2020, 10/19/2021   Moderna Sars-Covid-2 Vaccination 01/18/2019, 02/15/2019, 11/19/2019, 10/17/2020, 06/01/2021   Pneumococcal Conjugate-13 09/19/2013   Pneumococcal Polysaccharide-23 09/26/2003, 06/28/2016   Td 08/04/1998   Tdap 06/04/2010, 08/15/2021   Tetanus 08/04/1998   Unspecified SARS-COV-2 Vaccination 06/01/2021   Zoster, Live 01/16/2004   Pertinent  Health Maintenance Due  Topic Date Due   OPHTHALMOLOGY EXAM  04/17/2022   INFLUENZA VACCINE  08/04/2022   FOOT EXAM  08/20/2022   HEMOGLOBIN A1C  11/05/2022      12/05/2021    8:00 AM 12/05/2021   10:38 PM 12/06/2021    7:05 AM 12/06/2021    7:40 PM 12/07/2021    8:45 AM  Fall Risk  (RETIRED) Patient Fall Risk Level High fall risk Moderate fall risk High fall risk High fall risk High fall risk   Functional Status Survey:    Vitals:   05/16/22 0900 05/16/22 1044  BP: (!) 150/67 125/69  Pulse: 87   Resp: 20   Temp: 98.2 F (36.8 C)   SpO2: 98%   Weight: 166 lb (75.3 kg)   Height: 5\' 10"  (1.778 m)    Body mass index is 23.82 kg/m. Physical Exam Constitutional:      Comments: thin  Cardiovascular:     Rate and Rhythm: Normal rate and regular rhythm.      Pulses: Normal pulses.  Pulmonary:     Effort: Pulmonary effort is normal.     Comments: Left chest wall with TTP in intercostal space Neurological:     Mental Status: He is alert.     Labs reviewed: Recent Labs    09/02/21 0311 09/03/21 0457 09/16/21 0949 12/04/21 0118 12/04/21 0500 12/06/21 0348 02/08/22 1438 02/14/22 0000 05/12/22 0000  NA 138 137  --    < > 136 132* 134* 138 138  K 3.6 3.9  --    < > 3.6 3.7 3.9 3.5 3.9  CL 109 105  --    < > 105 103 96* 103 103  CO2 25 25  --    < > 25 22 24  31* 30*  GLUCOSE 125* 112*  --    < > 164* 166* 281*  --   --   BUN 26* 22  --    < > 25* 26* 50* 37* 26*  CREATININE 0.91 0.92  --    < > 0.79 0.79 1.20 1.0 0.7  CALCIUM 10.0 10.5*  --    < > 9.8 9.8 10.0 9.8 10.3  MG 1.5* 1.6* 1.6  --   --   --   --   --   --    < > = values in this interval not displayed.   Recent Labs    02/08/22 1438 05/12/22 0000  AST 42* 13*  ALT 25 12  ALKPHOS 77 55  BILITOT 1.0  --   PROT 6.6  --   ALBUMIN 2.9* 3.1*   Recent Labs    12/06/21 0348 12/07/21 0623 02/08/22 1438 02/08/22 1438 02/14/22 0000 02/20/22 0000 02/24/22 0000 03/17/22 0000 03/24/22 0000 05/12/22 0000  WBC 11.0* 10.6* 11.9*   < > 5.4 6.4 6.9 8.4 9.3 6.9  NEUTROABS  --   --   --   --  3,440.00 4,736.00 4,720.00  --   --   --   HGB 10.4* 10.7* 12.5*  --  11.5* 10.7* 10.7* 9.5* 10.0* 10.6*  HCT 30.8* 30.9* 39.0  --  34* 32* 33* 29* 30* 32*  MCV 93.1 93.1 94.2  --   --   --   --   --   --   --   PLT 138* 139* 189  --  110* 143* 185 152 176 144*   < > = values in this interval not displayed.   Lab Results  Component Value Date   TSH 0.76 12/11/2012   Lab Results  Component Value Date   HGBA1C 7.1 05/05/2022   Lab Results  Component Value Date   CHOL 99 05/05/2022   HDL 48 05/05/2022   LDLCALC 37 05/05/2022   TRIG 48 05/05/2022   CHOLHDL 2 02/25/2021    Significant Diagnostic Results in last 30 days:  No results  found.  Assessment/Plan Costochondritis Patient with tenderness to palpation in between ribs. Plan to continue scheduled tylenol. PRN lidoderm patch 5% for 12 hours daily prn.   Family/ staff Communication: nursing  Labs/tests ordered:  none

## 2022-05-18 DIAGNOSIS — Z950 Presence of cardiac pacemaker: Secondary | ICD-10-CM | POA: Diagnosis not present

## 2022-05-18 DIAGNOSIS — R262 Difficulty in walking, not elsewhere classified: Secondary | ICD-10-CM | POA: Diagnosis not present

## 2022-05-18 DIAGNOSIS — R2681 Unsteadiness on feet: Secondary | ICD-10-CM | POA: Diagnosis not present

## 2022-05-18 DIAGNOSIS — S42201D Unspecified fracture of upper end of right humerus, subsequent encounter for fracture with routine healing: Secondary | ICD-10-CM | POA: Diagnosis not present

## 2022-05-18 DIAGNOSIS — R2689 Other abnormalities of gait and mobility: Secondary | ICD-10-CM | POA: Diagnosis not present

## 2022-05-18 DIAGNOSIS — Z9181 History of falling: Secondary | ICD-10-CM | POA: Diagnosis not present

## 2022-05-18 DIAGNOSIS — R278 Other lack of coordination: Secondary | ICD-10-CM | POA: Diagnosis not present

## 2022-05-18 DIAGNOSIS — M6281 Muscle weakness (generalized): Secondary | ICD-10-CM | POA: Diagnosis not present

## 2022-05-18 DIAGNOSIS — M7022 Olecranon bursitis, left elbow: Secondary | ICD-10-CM | POA: Diagnosis not present

## 2022-05-18 DIAGNOSIS — I442 Atrioventricular block, complete: Secondary | ICD-10-CM | POA: Diagnosis not present

## 2022-05-18 DIAGNOSIS — M19122 Post-traumatic osteoarthritis, left elbow: Secondary | ICD-10-CM | POA: Diagnosis not present

## 2022-05-18 DIAGNOSIS — D696 Thrombocytopenia, unspecified: Secondary | ICD-10-CM | POA: Diagnosis not present

## 2022-05-18 DIAGNOSIS — I495 Sick sinus syndrome: Secondary | ICD-10-CM | POA: Diagnosis not present

## 2022-05-18 DIAGNOSIS — E1140 Type 2 diabetes mellitus with diabetic neuropathy, unspecified: Secondary | ICD-10-CM | POA: Diagnosis not present

## 2022-05-18 DIAGNOSIS — S42402D Unspecified fracture of lower end of left humerus, subsequent encounter for fracture with routine healing: Secondary | ICD-10-CM | POA: Diagnosis not present

## 2022-05-19 DIAGNOSIS — I442 Atrioventricular block, complete: Secondary | ICD-10-CM | POA: Diagnosis not present

## 2022-05-19 DIAGNOSIS — E1140 Type 2 diabetes mellitus with diabetic neuropathy, unspecified: Secondary | ICD-10-CM | POA: Diagnosis not present

## 2022-05-19 DIAGNOSIS — R2689 Other abnormalities of gait and mobility: Secondary | ICD-10-CM | POA: Diagnosis not present

## 2022-05-19 DIAGNOSIS — R278 Other lack of coordination: Secondary | ICD-10-CM | POA: Diagnosis not present

## 2022-05-19 DIAGNOSIS — M19122 Post-traumatic osteoarthritis, left elbow: Secondary | ICD-10-CM | POA: Diagnosis not present

## 2022-05-19 DIAGNOSIS — D696 Thrombocytopenia, unspecified: Secondary | ICD-10-CM | POA: Diagnosis not present

## 2022-05-19 DIAGNOSIS — M7022 Olecranon bursitis, left elbow: Secondary | ICD-10-CM | POA: Diagnosis not present

## 2022-05-19 DIAGNOSIS — I495 Sick sinus syndrome: Secondary | ICD-10-CM | POA: Diagnosis not present

## 2022-05-19 DIAGNOSIS — M6281 Muscle weakness (generalized): Secondary | ICD-10-CM | POA: Diagnosis not present

## 2022-05-19 DIAGNOSIS — R262 Difficulty in walking, not elsewhere classified: Secondary | ICD-10-CM | POA: Diagnosis not present

## 2022-05-19 DIAGNOSIS — Z9181 History of falling: Secondary | ICD-10-CM | POA: Diagnosis not present

## 2022-05-19 DIAGNOSIS — R2681 Unsteadiness on feet: Secondary | ICD-10-CM | POA: Diagnosis not present

## 2022-05-19 DIAGNOSIS — Z950 Presence of cardiac pacemaker: Secondary | ICD-10-CM | POA: Diagnosis not present

## 2022-05-19 DIAGNOSIS — S42402D Unspecified fracture of lower end of left humerus, subsequent encounter for fracture with routine healing: Secondary | ICD-10-CM | POA: Diagnosis not present

## 2022-05-19 DIAGNOSIS — S42201D Unspecified fracture of upper end of right humerus, subsequent encounter for fracture with routine healing: Secondary | ICD-10-CM | POA: Diagnosis not present

## 2022-05-20 DIAGNOSIS — I442 Atrioventricular block, complete: Secondary | ICD-10-CM | POA: Diagnosis not present

## 2022-05-20 DIAGNOSIS — S42402D Unspecified fracture of lower end of left humerus, subsequent encounter for fracture with routine healing: Secondary | ICD-10-CM | POA: Diagnosis not present

## 2022-05-20 DIAGNOSIS — R262 Difficulty in walking, not elsewhere classified: Secondary | ICD-10-CM | POA: Diagnosis not present

## 2022-05-20 DIAGNOSIS — M19122 Post-traumatic osteoarthritis, left elbow: Secondary | ICD-10-CM | POA: Diagnosis not present

## 2022-05-20 DIAGNOSIS — S42201D Unspecified fracture of upper end of right humerus, subsequent encounter for fracture with routine healing: Secondary | ICD-10-CM | POA: Diagnosis not present

## 2022-05-20 DIAGNOSIS — E1140 Type 2 diabetes mellitus with diabetic neuropathy, unspecified: Secondary | ICD-10-CM | POA: Diagnosis not present

## 2022-05-20 DIAGNOSIS — R2689 Other abnormalities of gait and mobility: Secondary | ICD-10-CM | POA: Diagnosis not present

## 2022-05-20 DIAGNOSIS — M6281 Muscle weakness (generalized): Secondary | ICD-10-CM | POA: Diagnosis not present

## 2022-05-20 DIAGNOSIS — Z950 Presence of cardiac pacemaker: Secondary | ICD-10-CM | POA: Diagnosis not present

## 2022-05-20 DIAGNOSIS — I495 Sick sinus syndrome: Secondary | ICD-10-CM | POA: Diagnosis not present

## 2022-05-20 DIAGNOSIS — R2681 Unsteadiness on feet: Secondary | ICD-10-CM | POA: Diagnosis not present

## 2022-05-20 DIAGNOSIS — R278 Other lack of coordination: Secondary | ICD-10-CM | POA: Diagnosis not present

## 2022-05-20 DIAGNOSIS — Z9181 History of falling: Secondary | ICD-10-CM | POA: Diagnosis not present

## 2022-05-20 DIAGNOSIS — M7022 Olecranon bursitis, left elbow: Secondary | ICD-10-CM | POA: Diagnosis not present

## 2022-05-20 DIAGNOSIS — D696 Thrombocytopenia, unspecified: Secondary | ICD-10-CM | POA: Diagnosis not present

## 2022-05-23 DIAGNOSIS — R2681 Unsteadiness on feet: Secondary | ICD-10-CM | POA: Diagnosis not present

## 2022-05-23 DIAGNOSIS — S42201D Unspecified fracture of upper end of right humerus, subsequent encounter for fracture with routine healing: Secondary | ICD-10-CM | POA: Diagnosis not present

## 2022-05-23 DIAGNOSIS — M7022 Olecranon bursitis, left elbow: Secondary | ICD-10-CM | POA: Diagnosis not present

## 2022-05-23 DIAGNOSIS — Z9181 History of falling: Secondary | ICD-10-CM | POA: Diagnosis not present

## 2022-05-23 DIAGNOSIS — D696 Thrombocytopenia, unspecified: Secondary | ICD-10-CM | POA: Diagnosis not present

## 2022-05-23 DIAGNOSIS — M6281 Muscle weakness (generalized): Secondary | ICD-10-CM | POA: Diagnosis not present

## 2022-05-23 DIAGNOSIS — I442 Atrioventricular block, complete: Secondary | ICD-10-CM | POA: Diagnosis not present

## 2022-05-23 DIAGNOSIS — S42402D Unspecified fracture of lower end of left humerus, subsequent encounter for fracture with routine healing: Secondary | ICD-10-CM | POA: Diagnosis not present

## 2022-05-23 DIAGNOSIS — I495 Sick sinus syndrome: Secondary | ICD-10-CM | POA: Diagnosis not present

## 2022-05-23 DIAGNOSIS — Z950 Presence of cardiac pacemaker: Secondary | ICD-10-CM | POA: Diagnosis not present

## 2022-05-23 DIAGNOSIS — M19122 Post-traumatic osteoarthritis, left elbow: Secondary | ICD-10-CM | POA: Diagnosis not present

## 2022-05-23 DIAGNOSIS — R262 Difficulty in walking, not elsewhere classified: Secondary | ICD-10-CM | POA: Diagnosis not present

## 2022-05-23 DIAGNOSIS — R278 Other lack of coordination: Secondary | ICD-10-CM | POA: Diagnosis not present

## 2022-05-23 DIAGNOSIS — E1140 Type 2 diabetes mellitus with diabetic neuropathy, unspecified: Secondary | ICD-10-CM | POA: Diagnosis not present

## 2022-05-23 DIAGNOSIS — R2689 Other abnormalities of gait and mobility: Secondary | ICD-10-CM | POA: Diagnosis not present

## 2022-05-24 DIAGNOSIS — R278 Other lack of coordination: Secondary | ICD-10-CM | POA: Diagnosis not present

## 2022-05-24 DIAGNOSIS — S42201D Unspecified fracture of upper end of right humerus, subsequent encounter for fracture with routine healing: Secondary | ICD-10-CM | POA: Diagnosis not present

## 2022-05-24 DIAGNOSIS — Z9181 History of falling: Secondary | ICD-10-CM | POA: Diagnosis not present

## 2022-05-24 DIAGNOSIS — H18423 Band keratopathy, bilateral: Secondary | ICD-10-CM | POA: Diagnosis not present

## 2022-05-24 DIAGNOSIS — R2689 Other abnormalities of gait and mobility: Secondary | ICD-10-CM | POA: Diagnosis not present

## 2022-05-24 DIAGNOSIS — M7022 Olecranon bursitis, left elbow: Secondary | ICD-10-CM | POA: Diagnosis not present

## 2022-05-24 DIAGNOSIS — R2681 Unsteadiness on feet: Secondary | ICD-10-CM | POA: Diagnosis not present

## 2022-05-24 DIAGNOSIS — M6281 Muscle weakness (generalized): Secondary | ICD-10-CM | POA: Diagnosis not present

## 2022-05-24 DIAGNOSIS — Z950 Presence of cardiac pacemaker: Secondary | ICD-10-CM | POA: Diagnosis not present

## 2022-05-24 DIAGNOSIS — I442 Atrioventricular block, complete: Secondary | ICD-10-CM | POA: Diagnosis not present

## 2022-05-24 DIAGNOSIS — I495 Sick sinus syndrome: Secondary | ICD-10-CM | POA: Diagnosis not present

## 2022-05-24 DIAGNOSIS — D696 Thrombocytopenia, unspecified: Secondary | ICD-10-CM | POA: Diagnosis not present

## 2022-05-24 DIAGNOSIS — M19122 Post-traumatic osteoarthritis, left elbow: Secondary | ICD-10-CM | POA: Diagnosis not present

## 2022-05-24 DIAGNOSIS — E1140 Type 2 diabetes mellitus with diabetic neuropathy, unspecified: Secondary | ICD-10-CM | POA: Diagnosis not present

## 2022-05-24 DIAGNOSIS — Z961 Presence of intraocular lens: Secondary | ICD-10-CM | POA: Diagnosis not present

## 2022-05-24 DIAGNOSIS — S42402D Unspecified fracture of lower end of left humerus, subsequent encounter for fracture with routine healing: Secondary | ICD-10-CM | POA: Diagnosis not present

## 2022-05-24 DIAGNOSIS — R262 Difficulty in walking, not elsewhere classified: Secondary | ICD-10-CM | POA: Diagnosis not present

## 2022-05-25 DIAGNOSIS — R262 Difficulty in walking, not elsewhere classified: Secondary | ICD-10-CM | POA: Diagnosis not present

## 2022-05-25 DIAGNOSIS — M6281 Muscle weakness (generalized): Secondary | ICD-10-CM | POA: Diagnosis not present

## 2022-05-25 DIAGNOSIS — M19122 Post-traumatic osteoarthritis, left elbow: Secondary | ICD-10-CM | POA: Diagnosis not present

## 2022-05-25 DIAGNOSIS — S42402D Unspecified fracture of lower end of left humerus, subsequent encounter for fracture with routine healing: Secondary | ICD-10-CM | POA: Diagnosis not present

## 2022-05-25 DIAGNOSIS — S42201D Unspecified fracture of upper end of right humerus, subsequent encounter for fracture with routine healing: Secondary | ICD-10-CM | POA: Diagnosis not present

## 2022-05-25 DIAGNOSIS — D696 Thrombocytopenia, unspecified: Secondary | ICD-10-CM | POA: Diagnosis not present

## 2022-05-25 DIAGNOSIS — Z9181 History of falling: Secondary | ICD-10-CM | POA: Diagnosis not present

## 2022-05-25 DIAGNOSIS — R2681 Unsteadiness on feet: Secondary | ICD-10-CM | POA: Diagnosis not present

## 2022-05-25 DIAGNOSIS — R278 Other lack of coordination: Secondary | ICD-10-CM | POA: Diagnosis not present

## 2022-05-25 DIAGNOSIS — I442 Atrioventricular block, complete: Secondary | ICD-10-CM | POA: Diagnosis not present

## 2022-05-25 DIAGNOSIS — Z950 Presence of cardiac pacemaker: Secondary | ICD-10-CM | POA: Diagnosis not present

## 2022-05-25 DIAGNOSIS — M7022 Olecranon bursitis, left elbow: Secondary | ICD-10-CM | POA: Diagnosis not present

## 2022-05-25 DIAGNOSIS — R2689 Other abnormalities of gait and mobility: Secondary | ICD-10-CM | POA: Diagnosis not present

## 2022-05-25 DIAGNOSIS — E1140 Type 2 diabetes mellitus with diabetic neuropathy, unspecified: Secondary | ICD-10-CM | POA: Diagnosis not present

## 2022-05-25 DIAGNOSIS — I495 Sick sinus syndrome: Secondary | ICD-10-CM | POA: Diagnosis not present

## 2022-05-26 DIAGNOSIS — R2689 Other abnormalities of gait and mobility: Secondary | ICD-10-CM | POA: Diagnosis not present

## 2022-05-26 DIAGNOSIS — D696 Thrombocytopenia, unspecified: Secondary | ICD-10-CM | POA: Diagnosis not present

## 2022-05-26 DIAGNOSIS — S42201D Unspecified fracture of upper end of right humerus, subsequent encounter for fracture with routine healing: Secondary | ICD-10-CM | POA: Diagnosis not present

## 2022-05-26 DIAGNOSIS — R278 Other lack of coordination: Secondary | ICD-10-CM | POA: Diagnosis not present

## 2022-05-26 DIAGNOSIS — S42402D Unspecified fracture of lower end of left humerus, subsequent encounter for fracture with routine healing: Secondary | ICD-10-CM | POA: Diagnosis not present

## 2022-05-26 DIAGNOSIS — M19122 Post-traumatic osteoarthritis, left elbow: Secondary | ICD-10-CM | POA: Diagnosis not present

## 2022-05-26 DIAGNOSIS — Z950 Presence of cardiac pacemaker: Secondary | ICD-10-CM | POA: Diagnosis not present

## 2022-05-26 DIAGNOSIS — M7022 Olecranon bursitis, left elbow: Secondary | ICD-10-CM | POA: Diagnosis not present

## 2022-05-26 DIAGNOSIS — M6281 Muscle weakness (generalized): Secondary | ICD-10-CM | POA: Diagnosis not present

## 2022-05-26 DIAGNOSIS — R2681 Unsteadiness on feet: Secondary | ICD-10-CM | POA: Diagnosis not present

## 2022-05-26 DIAGNOSIS — R262 Difficulty in walking, not elsewhere classified: Secondary | ICD-10-CM | POA: Diagnosis not present

## 2022-05-26 DIAGNOSIS — E1140 Type 2 diabetes mellitus with diabetic neuropathy, unspecified: Secondary | ICD-10-CM | POA: Diagnosis not present

## 2022-05-26 DIAGNOSIS — I442 Atrioventricular block, complete: Secondary | ICD-10-CM | POA: Diagnosis not present

## 2022-05-26 DIAGNOSIS — Z9181 History of falling: Secondary | ICD-10-CM | POA: Diagnosis not present

## 2022-05-26 DIAGNOSIS — I495 Sick sinus syndrome: Secondary | ICD-10-CM | POA: Diagnosis not present

## 2022-05-27 DIAGNOSIS — Z9181 History of falling: Secondary | ICD-10-CM | POA: Diagnosis not present

## 2022-05-27 DIAGNOSIS — S42201D Unspecified fracture of upper end of right humerus, subsequent encounter for fracture with routine healing: Secondary | ICD-10-CM | POA: Diagnosis not present

## 2022-05-27 DIAGNOSIS — R278 Other lack of coordination: Secondary | ICD-10-CM | POA: Diagnosis not present

## 2022-05-27 DIAGNOSIS — M6281 Muscle weakness (generalized): Secondary | ICD-10-CM | POA: Diagnosis not present

## 2022-05-27 DIAGNOSIS — M7022 Olecranon bursitis, left elbow: Secondary | ICD-10-CM | POA: Diagnosis not present

## 2022-05-27 DIAGNOSIS — R2689 Other abnormalities of gait and mobility: Secondary | ICD-10-CM | POA: Diagnosis not present

## 2022-05-27 DIAGNOSIS — S42402D Unspecified fracture of lower end of left humerus, subsequent encounter for fracture with routine healing: Secondary | ICD-10-CM | POA: Diagnosis not present

## 2022-05-27 DIAGNOSIS — Z950 Presence of cardiac pacemaker: Secondary | ICD-10-CM | POA: Diagnosis not present

## 2022-05-27 DIAGNOSIS — R2681 Unsteadiness on feet: Secondary | ICD-10-CM | POA: Diagnosis not present

## 2022-05-27 DIAGNOSIS — R262 Difficulty in walking, not elsewhere classified: Secondary | ICD-10-CM | POA: Diagnosis not present

## 2022-05-27 DIAGNOSIS — I442 Atrioventricular block, complete: Secondary | ICD-10-CM | POA: Diagnosis not present

## 2022-05-27 DIAGNOSIS — E1140 Type 2 diabetes mellitus with diabetic neuropathy, unspecified: Secondary | ICD-10-CM | POA: Diagnosis not present

## 2022-05-27 DIAGNOSIS — I495 Sick sinus syndrome: Secondary | ICD-10-CM | POA: Diagnosis not present

## 2022-05-27 DIAGNOSIS — M19122 Post-traumatic osteoarthritis, left elbow: Secondary | ICD-10-CM | POA: Diagnosis not present

## 2022-05-27 DIAGNOSIS — D696 Thrombocytopenia, unspecified: Secondary | ICD-10-CM | POA: Diagnosis not present

## 2022-05-29 DIAGNOSIS — M6281 Muscle weakness (generalized): Secondary | ICD-10-CM | POA: Diagnosis not present

## 2022-05-29 DIAGNOSIS — I495 Sick sinus syndrome: Secondary | ICD-10-CM | POA: Diagnosis not present

## 2022-05-29 DIAGNOSIS — R2689 Other abnormalities of gait and mobility: Secondary | ICD-10-CM | POA: Diagnosis not present

## 2022-05-29 DIAGNOSIS — M19122 Post-traumatic osteoarthritis, left elbow: Secondary | ICD-10-CM | POA: Diagnosis not present

## 2022-05-29 DIAGNOSIS — E1140 Type 2 diabetes mellitus with diabetic neuropathy, unspecified: Secondary | ICD-10-CM | POA: Diagnosis not present

## 2022-05-29 DIAGNOSIS — R2681 Unsteadiness on feet: Secondary | ICD-10-CM | POA: Diagnosis not present

## 2022-05-29 DIAGNOSIS — S42402D Unspecified fracture of lower end of left humerus, subsequent encounter for fracture with routine healing: Secondary | ICD-10-CM | POA: Diagnosis not present

## 2022-05-29 DIAGNOSIS — D696 Thrombocytopenia, unspecified: Secondary | ICD-10-CM | POA: Diagnosis not present

## 2022-05-29 DIAGNOSIS — Z950 Presence of cardiac pacemaker: Secondary | ICD-10-CM | POA: Diagnosis not present

## 2022-05-29 DIAGNOSIS — I442 Atrioventricular block, complete: Secondary | ICD-10-CM | POA: Diagnosis not present

## 2022-05-29 DIAGNOSIS — Z9181 History of falling: Secondary | ICD-10-CM | POA: Diagnosis not present

## 2022-05-29 DIAGNOSIS — S42201D Unspecified fracture of upper end of right humerus, subsequent encounter for fracture with routine healing: Secondary | ICD-10-CM | POA: Diagnosis not present

## 2022-05-29 DIAGNOSIS — R262 Difficulty in walking, not elsewhere classified: Secondary | ICD-10-CM | POA: Diagnosis not present

## 2022-05-29 DIAGNOSIS — M7022 Olecranon bursitis, left elbow: Secondary | ICD-10-CM | POA: Diagnosis not present

## 2022-05-29 DIAGNOSIS — R278 Other lack of coordination: Secondary | ICD-10-CM | POA: Diagnosis not present

## 2022-05-30 DIAGNOSIS — Z9181 History of falling: Secondary | ICD-10-CM | POA: Diagnosis not present

## 2022-05-30 DIAGNOSIS — I495 Sick sinus syndrome: Secondary | ICD-10-CM | POA: Diagnosis not present

## 2022-05-30 DIAGNOSIS — S42201D Unspecified fracture of upper end of right humerus, subsequent encounter for fracture with routine healing: Secondary | ICD-10-CM | POA: Diagnosis not present

## 2022-05-30 DIAGNOSIS — R262 Difficulty in walking, not elsewhere classified: Secondary | ICD-10-CM | POA: Diagnosis not present

## 2022-05-30 DIAGNOSIS — R278 Other lack of coordination: Secondary | ICD-10-CM | POA: Diagnosis not present

## 2022-05-30 DIAGNOSIS — M6281 Muscle weakness (generalized): Secondary | ICD-10-CM | POA: Diagnosis not present

## 2022-05-30 DIAGNOSIS — M19122 Post-traumatic osteoarthritis, left elbow: Secondary | ICD-10-CM | POA: Diagnosis not present

## 2022-05-30 DIAGNOSIS — R2689 Other abnormalities of gait and mobility: Secondary | ICD-10-CM | POA: Diagnosis not present

## 2022-05-30 DIAGNOSIS — D696 Thrombocytopenia, unspecified: Secondary | ICD-10-CM | POA: Diagnosis not present

## 2022-05-30 DIAGNOSIS — S42402D Unspecified fracture of lower end of left humerus, subsequent encounter for fracture with routine healing: Secondary | ICD-10-CM | POA: Diagnosis not present

## 2022-05-30 DIAGNOSIS — R2681 Unsteadiness on feet: Secondary | ICD-10-CM | POA: Diagnosis not present

## 2022-05-30 DIAGNOSIS — I442 Atrioventricular block, complete: Secondary | ICD-10-CM | POA: Diagnosis not present

## 2022-05-30 DIAGNOSIS — E1140 Type 2 diabetes mellitus with diabetic neuropathy, unspecified: Secondary | ICD-10-CM | POA: Diagnosis not present

## 2022-05-30 DIAGNOSIS — Z950 Presence of cardiac pacemaker: Secondary | ICD-10-CM | POA: Diagnosis not present

## 2022-05-30 DIAGNOSIS — M7022 Olecranon bursitis, left elbow: Secondary | ICD-10-CM | POA: Diagnosis not present

## 2022-05-31 ENCOUNTER — Non-Acute Institutional Stay (SKILLED_NURSING_FACILITY): Payer: Medicare Other | Admitting: Nurse Practitioner

## 2022-05-31 ENCOUNTER — Encounter: Payer: Self-pay | Admitting: Nurse Practitioner

## 2022-05-31 DIAGNOSIS — R195 Other fecal abnormalities: Secondary | ICD-10-CM

## 2022-05-31 DIAGNOSIS — N138 Other obstructive and reflux uropathy: Secondary | ICD-10-CM | POA: Diagnosis not present

## 2022-05-31 DIAGNOSIS — I495 Sick sinus syndrome: Secondary | ICD-10-CM | POA: Diagnosis not present

## 2022-05-31 DIAGNOSIS — E440 Moderate protein-calorie malnutrition: Secondary | ICD-10-CM | POA: Diagnosis not present

## 2022-05-31 DIAGNOSIS — E1140 Type 2 diabetes mellitus with diabetic neuropathy, unspecified: Secondary | ICD-10-CM | POA: Diagnosis not present

## 2022-05-31 DIAGNOSIS — F39 Unspecified mood [affective] disorder: Secondary | ICD-10-CM

## 2022-05-31 DIAGNOSIS — Z950 Presence of cardiac pacemaker: Secondary | ICD-10-CM | POA: Diagnosis not present

## 2022-05-31 DIAGNOSIS — I442 Atrioventricular block, complete: Secondary | ICD-10-CM | POA: Diagnosis not present

## 2022-05-31 DIAGNOSIS — M720 Palmar fascial fibromatosis [Dupuytren]: Secondary | ICD-10-CM

## 2022-05-31 DIAGNOSIS — S42402D Unspecified fracture of lower end of left humerus, subsequent encounter for fracture with routine healing: Secondary | ICD-10-CM | POA: Diagnosis not present

## 2022-05-31 DIAGNOSIS — M19122 Post-traumatic osteoarthritis, left elbow: Secondary | ICD-10-CM | POA: Diagnosis not present

## 2022-05-31 DIAGNOSIS — R278 Other lack of coordination: Secondary | ICD-10-CM | POA: Diagnosis not present

## 2022-05-31 DIAGNOSIS — D696 Thrombocytopenia, unspecified: Secondary | ICD-10-CM | POA: Diagnosis not present

## 2022-05-31 DIAGNOSIS — R262 Difficulty in walking, not elsewhere classified: Secondary | ICD-10-CM | POA: Diagnosis not present

## 2022-05-31 DIAGNOSIS — D649 Anemia, unspecified: Secondary | ICD-10-CM

## 2022-05-31 DIAGNOSIS — Z9181 History of falling: Secondary | ICD-10-CM | POA: Diagnosis not present

## 2022-05-31 DIAGNOSIS — R2689 Other abnormalities of gait and mobility: Secondary | ICD-10-CM | POA: Diagnosis not present

## 2022-05-31 DIAGNOSIS — M6281 Muscle weakness (generalized): Secondary | ICD-10-CM | POA: Diagnosis not present

## 2022-05-31 DIAGNOSIS — S42201D Unspecified fracture of upper end of right humerus, subsequent encounter for fracture with routine healing: Secondary | ICD-10-CM | POA: Diagnosis not present

## 2022-05-31 DIAGNOSIS — R2681 Unsteadiness on feet: Secondary | ICD-10-CM | POA: Diagnosis not present

## 2022-05-31 DIAGNOSIS — M7022 Olecranon bursitis, left elbow: Secondary | ICD-10-CM | POA: Diagnosis not present

## 2022-05-31 DIAGNOSIS — N401 Enlarged prostate with lower urinary tract symptoms: Secondary | ICD-10-CM

## 2022-05-31 NOTE — Progress Notes (Unsigned)
Location:  Other [place].  Nursing Home Room Number: [facility] [address] Place of Service:  SNF [phone]) [name], NP  PCP: [name], MD  Patient Care Team: [name], MD as PCP - General (Family Medicine) [name], MD as PCP - Cardiology (Cardiology) [name], MD as Consulting Physician (Cardiology)  Extended Emergency Contact Information Primary Emergency Contact: [name] [facility]) Address: [address]          [place], [place] [address] [name] of [employer] Phone: [phone] Relation: Son  Goals of care: Advanced Directive information    05/31/2022    1:14 PM  Advanced Directives  Does Patient Have a Medical Advance Directive? Yes  Type of Estate agent of [place];Living will;Out of facility DNR (pink MOST or yellow form)  Does patient want to make changes to medical advance directive? No - Patient declined  Copy of Healthcare Power of Attorney in Chart? Yes - validated most recent copy scanned in chart (See row information)     Chief Complaint  Patient presents with   Medical Management of Chronic Issues    Medical Management of Chronic Issues.     HPI:  Pt is a 89 y.o. male seen today for medical management of chronic disease.  Pt has had some pain in his hands. Reports he can not straighten fingers on right hand and feeling tight. Really started to noticed over the weekend.  Has not needed anything for pain. Pain level 0 at this time but when it acts up it is low grade and more of a discomfort.   No constipation. Loose stool. He is on benefiber which has been effective but making him nauseated when he drinks it  No issues with urination.   He holds ribs when he coughs but overall feeling better.    Past Medical History:  Diagnosis Date   Asthma    Atrial flutter (HCC) 2007   Band keratopathy    Benign prostatic hypertrophy    Chronic ear infection    Chronic kidney disease     Chronic prostatitis    Dental bridge present    permanent - upper   Diabetes mellitus    Elbow stiffness, left    s/p fracture many yrs ago.  arm does not straighten   GERD (gastroesophageal reflux disease)    laryngeal involvement   Hyperlipidemia    Hypertension    Obstructive sleep apnea    CPAP-9   Osteoarthrosis, localized, primary, knee    post-traumatic   Prostatitis    Stroke (HCC)    TIA   Tachy-brady syndrome (HCC)    UTI (urinary tract infection)    Past Surgical History:  Procedure Laterality Date   APPENDECTOMY     BELPHAROPTOSIS REPAIR     Dr [name]---didn't resolve weepy eye and eyelid drooping   CARDIOVERSION  04/13/2010   CATARACT EXTRACTION, BILATERAL  2009   Chest pain  8/12   Stress test benign   ESOPHAGEAL DILATION  05/26/2016   Procedure: ESOPHAGEAL DILATION;  Surgeon: [name], MD;  Location: [facility];  Service: Endoscopy;;   ESOPHAGOGASTRODUODENOSCOPY (EGD) WITH PROPOFOL N/A 05/26/2016   Procedure: ESOPHAGOGASTRODUODENOSCOPY (EGD) WITH PROPOFOL;  Surgeon: [name], MD;  Location: [facility];  Service: Endoscopy;  Laterality: N/A;  Diabetic - oral meds sleep apnea   EYE SURGERY     FRACTURE SURGERY Left    elbow   KNEE SURGERY  1998   plate after fracture,  then removed for infection   left elbow surgery     MASTOIDECTOMY  8/08   Dr [name]   PACEMAKER IMPLANT N/A 09/02/2021   Procedure: PACEMAKER IMPLANT;  Surgeon: [name], MD;  Location: [facility] INVASIVE CV LAB;  Service: Cardiovascular;  Laterality: N/A;   RHINOPLASTY  5/10   and septoplasty   SUBACROMIAL DECOMPRESSION Right 2005   Arthroscopic (for rotator cuff and biceps tendon ruptures)   TEAR DUCT PROBING  11/13   Dr [name]   TOTAL KNEE ARTHROPLASTY Left 09/01/2014   Procedure: TOTAL KNEE ARTHROPLASTY;  Surgeon: [name], MD;  Location: [facility] ORS;  Service: Orthopedics;  Laterality: Left;   TRIGGER FINGER RELEASE Left 02/25/2015   Procedure: LEFT LONG  TRIGGER RELEASE;  Surgeon: [name], MD;  Location: [facility] OR[facility];  Service: Orthopedics;  Laterality: Left;   VENOUS ABLATION      Allergies  Allergen Reactions   Lovenox [Enoxaparin] Hives   Proscar [Finasteride] Other (See Comments)    Upper body and arm weakness   Latex Rash    Has trouble with BANDAIDS that have been left on for more than 24 hours. Prefers paper tape.   Nickel Rash    Localized rash   Percocet [Oxycodone-Acetaminophen] Rash   Tape Rash and Other (See Comments)    Sensitivity     Outpatient Encounter Medications as of 05/31/2022  Medication Sig   acetaminophen (TYLENOL) 500 MG tablet Take 1,000 mg by mouth every 8 (eight) hours as needed.   apixaban (ELIQUIS) 2.5 MG TABS tablet Take 2.5 mg by mouth 2 (two) times daily.   APPLE CIDER VINEGAR PO Take 1 capsule by mouth at bedtime.   atorvastatin (LIPITOR) 80 MG tablet Take 1 tablet (80 mg total) by mouth daily.   Cranberry 250 MG TABS Take 1 tablet by mouth 2 (two) times daily.   Cyanocobalamin (B-12 PO) Take 1,000 mg by mouth daily.   finasteride (PROSCAR) 5 MG tablet Take 1 tablet (5 mg total) by mouth daily.   guaiFENesin (TUSSIN PO) Take 10 mLs by mouth every 4 (four) hours as needed.   lactose free nutrition (BOOST) LIQD Take 237 mLs by mouth 3 (three) times daily between meals.   lidocaine (LIDODERM) 5 % Place 1 patch onto the skin daily. Remove & Discard patch within 12 hours or as directed by MD   Magnesium Oxide 400 MG CAPS Take 1 capsule (400 mg total) by mouth daily.   metFORMIN (GLUCOPHAGE) 500 MG tablet TAKE 1 TABLET BY MOUTH  TWICE DAILY WITH MEALS   mirtazapine (REMERON) 7.5 MG tablet Take 1 tablet (7.5 mg total) by mouth at bedtime.   Polyethyl Glycol-Propyl Glycol (SYSTANE ULTRA OP) Place 1 drop into both eyes daily as needed (dry eyes).   polyethylene glycol (MIRALAX / GLYCOLAX) 17 g packet Take 17 g by mouth. Daily every Monday, Wednesday and Friday.   senna (SENOKOT) 8.6 MG TABS tablet Take 1  tablet (8.6 mg total) by mouth daily.   sertraline (ZOLOFT) 50 MG tablet Take 50 mg by mouth daily.   silver sulfADIAZINE (SILVADENE) 1 % cream Apply 1 Application topically daily.   sodium chloride (OCEAN) 0.65 % SOLN nasal spray Place 2 sprays into both nostrils 2 (two) times daily as needed for congestion.   tamsulosin (FLOMAX) 0.4 MG CAPS capsule Take 2 capsules (0.8 mg total) by mouth at bedtime.   Wheat Dextrin (BENEFIBER) POWD Give 2 tsp by mouth in the morning for Bowel Regulation mix  in 8oz liquid of resident's choice   No facility-administered encounter medications on file as of 05/31/2022.    Review of Systems  Constitutional:  Negative for activity change, appetite change, fatigue and unexpected weight change.  HENT:  Negative for congestion and hearing loss.   Eyes: Negative.   Respiratory:  Negative for cough and shortness of breath.   Cardiovascular:  Negative for chest pain, palpitations and leg swelling.  Gastrointestinal:  Negative for abdominal pain, constipation and diarrhea.  Genitourinary:  Negative for difficulty urinating and dysuria.  Musculoskeletal:  Positive for arthralgias and myalgias.  Skin:  Negative for color change and wound.  Neurological:  Negative for dizziness and weakness.  Psychiatric/Behavioral:  Negative for agitation, behavioral problems and confusion.      Immunization History  Administered Date(s) Administered   Fluad Quad(high Dose 65+) 09/14/2018   H1N1 12/13/2007   Influenza Split 09/11/2010   Influenza Whole 09/12/2011   Influenza, High Dose Seasonal PF 09/20/2012, 09/07/2017   Influenza, Seasonal, Injecte, Preservative Fre 09/08/2007, 09/26/2008, 10/02/2014, 09/18/2015   Influenza,inj,Quad PF,6+ Mos 09/19/2013, 09/21/2015   Influenza-Unspecified 09/07/2017, 09/11/2019, 09/03/2020, 10/19/2021   Moderna Sars-Covid-2 Vaccination 01/18/2019, 02/15/2019, 11/19/2019, 10/17/2020, 06/01/2021   Pneumococcal Conjugate-13 09/19/2013    Pneumococcal Polysaccharide-23 09/26/2003, 06/28/2016   Td 08/04/1998   Tdap 06/04/2010, 08/15/2021   Tetanus 08/04/1998   Unspecified SARS-COV-2 Vaccination 06/01/2021   Zoster, Live 01/16/2004   Pertinent  Health Maintenance Due  Topic Date Due   OPHTHALMOLOGY EXAM  04/17/2022   INFLUENZA VACCINE  08/04/2022   FOOT EXAM  08/20/2022   HEMOGLOBIN A1C  11/05/2022      12/05/2021    8:00 AM 12/05/2021   10:38 PM 12/06/2021    7:05 AM 12/06/2021    7:40 PM 12/07/2021    8:45 AM  Fall Risk  (RETIRED) Patient Fall Risk Level High fall risk Moderate fall risk High fall risk High fall risk High fall risk   Functional Status Survey:    Vitals:   05/31/22 1308  BP: 118/73  Pulse: 98  Resp: 20  Temp: 97.7 F (36.5 C)  SpO2: 94%  Weight: 166 lb 9.6 oz (75.6 kg)  Height: 5\' 10"  (1.778 m)   Body mass index is 23.9 kg/m. Physical Exam Constitutional:      General: He is not in acute distress.    Appearance: He is well-developed. He is not diaphoretic.  HENT:     Head: Normocephalic and atraumatic.     Right Ear: External ear normal.     Left Ear: External ear normal.     Mouth/Throat:     Pharynx: No oropharyngeal exudate.  Eyes:     Conjunctiva/sclera: Conjunctivae normal.     Pupils: Pupils are equal, round, and reactive to light.  Cardiovascular:     Rate and Rhythm: Normal rate and regular rhythm.     Heart sounds: Normal heart sounds.  Pulmonary:     Effort: Pulmonary effort is normal.     Breath sounds: Normal breath sounds.  Abdominal:     General: Bowel sounds are normal.     Palpations: Abdomen is soft.  Musculoskeletal:        General: No tenderness.     Right hand: Decreased range of motion.     Left hand: Decreased range of motion.     Cervical back: Normal range of motion and neck supple.     Right lower leg: No edema.     Left lower leg: No edema.  Skin:    General: Skin is warm and dry.  Neurological:     Mental Status: He is alert and oriented to  person, place, and time.     Motor: Weakness present.     Gait: Gait abnormal.     Labs reviewed: Recent Labs    09/02/21 0311 09/03/21 0457 09/16/21 0949 12/04/21 0118 12/04/21 0500 12/06/21 0348 02/08/22 1438 02/14/22 0000 05/12/22 0000  NA 138 137  --    < > 136 132* 134* 138 138  K 3.6 3.9  --    < > 3.6 3.7 3.9 3.5 3.9  CL 109 105  --    < > 105 103 96* 103 103  CO2 25 25  --    < > 25 22 24  31* 30*  GLUCOSE 125* 112*  --    < > 164* 166* 281*  --   --   BUN 26* 22  --    < > 25* 26* 50* 37* 26*  CREATININE 0.91 0.92  --    < > 0.79 0.79 1.20 1.0 0.7  CALCIUM 10.0 10.5*  --    < > 9.8 9.8 10.0 9.8 10.3  MG 1.5* 1.6* 1.6  --   --   --   --   --   --    < > = values in this interval not displayed.   Recent Labs    02/08/22 1438 05/12/22 0000  AST 42* 13*  ALT 25 12  ALKPHOS 77 55  BILITOT 1.0  --   PROT 6.6  --   ALBUMIN 2.9* 3.1*   Recent Labs    12/06/21 0348 12/07/21 0623 02/08/22 1438 02/08/22 1438 02/14/22 0000 02/20/22 0000 02/24/22 0000 03/17/22 0000 03/24/22 0000 05/12/22 0000  WBC 11.0* 10.6* 11.9*   < > 5.4 6.4 6.9 8.4 9.3 6.9  NEUTROABS  --   --   --   --  3,440.00 4,736.00 4,720.00  --   --   --   HGB 10.4* 10.7* 12.5*  --  11.5* 10.7* 10.7* 9.5* 10.0* 10.6*  HCT 30.8* 30.9* 39.0  --  34* 32* 33* 29* 30* 32*  MCV 93.1 93.1 94.2  --   --   --   --   --   --   --   PLT 138* 139* 189  --  110* 143* 185 152 176 144*   < > = values in this interval not displayed.   Lab Results  Component Value Date   TSH 0.76 12/11/2012   Lab Results  Component Value Date   HGBA1C 7.1 05/05/2022   Lab Results  Component Value Date   CHOL 99 05/05/2022   HDL 48 05/05/2022   LDLCALC 37 05/05/2022   TRIG 48 05/05/2022   CHOLHDL 2 02/25/2021    Significant Diagnostic Results in last 30 days:  No results found.  Assessment/Plan 1. Loose stools -doing well on fiber supplement but causing nausea when taking. Will change to tablet at this time.    2. Type 2 diabetes, controlled, with neuropathy (HCC) -A1c at goal for age, continue current regimen with dietary compliance, routine foot care/monitoring and to keep up with diabetic eye exams through ophthalmology  -?if metformin causing loose stools  3. Anemia, unspecified type -stable, continue to monitor.   4. Dupuytren's contracture of right hand -requesting ortho consult at this time Continues with PT  5. BPH with obstruction/lower urinary tract symptoms -stable at this time, continues on  flomax.  Continues with urology follow up  6. Mood disorder (HCC) Controlled at [facility] stime.   7. Moderate protein-calorie malnutrition (HCC) -encouraged to liberalize diet. To have protein supplement in addition to smallest meal of the day. Continues on remeron.   Janene Harvey. Biagio Borg [facility] [phone]

## 2022-06-01 DIAGNOSIS — Z950 Presence of cardiac pacemaker: Secondary | ICD-10-CM | POA: Diagnosis not present

## 2022-06-01 DIAGNOSIS — R262 Difficulty in walking, not elsewhere classified: Secondary | ICD-10-CM | POA: Diagnosis not present

## 2022-06-01 DIAGNOSIS — M19122 Post-traumatic osteoarthritis, left elbow: Secondary | ICD-10-CM | POA: Diagnosis not present

## 2022-06-01 DIAGNOSIS — I442 Atrioventricular block, complete: Secondary | ICD-10-CM | POA: Diagnosis not present

## 2022-06-01 DIAGNOSIS — I495 Sick sinus syndrome: Secondary | ICD-10-CM | POA: Diagnosis not present

## 2022-06-01 DIAGNOSIS — R2689 Other abnormalities of gait and mobility: Secondary | ICD-10-CM | POA: Diagnosis not present

## 2022-06-01 DIAGNOSIS — M6281 Muscle weakness (generalized): Secondary | ICD-10-CM | POA: Diagnosis not present

## 2022-06-01 DIAGNOSIS — S42402D Unspecified fracture of lower end of left humerus, subsequent encounter for fracture with routine healing: Secondary | ICD-10-CM | POA: Diagnosis not present

## 2022-06-01 DIAGNOSIS — Z9181 History of falling: Secondary | ICD-10-CM | POA: Diagnosis not present

## 2022-06-01 DIAGNOSIS — E1140 Type 2 diabetes mellitus with diabetic neuropathy, unspecified: Secondary | ICD-10-CM | POA: Diagnosis not present

## 2022-06-01 DIAGNOSIS — R278 Other lack of coordination: Secondary | ICD-10-CM | POA: Diagnosis not present

## 2022-06-01 DIAGNOSIS — R2681 Unsteadiness on feet: Secondary | ICD-10-CM | POA: Diagnosis not present

## 2022-06-01 DIAGNOSIS — M7022 Olecranon bursitis, left elbow: Secondary | ICD-10-CM | POA: Diagnosis not present

## 2022-06-01 DIAGNOSIS — S42201D Unspecified fracture of upper end of right humerus, subsequent encounter for fracture with routine healing: Secondary | ICD-10-CM | POA: Diagnosis not present

## 2022-06-01 DIAGNOSIS — D696 Thrombocytopenia, unspecified: Secondary | ICD-10-CM | POA: Diagnosis not present

## 2022-06-01 LAB — HM DIABETES EYE EXAM

## 2022-06-02 ENCOUNTER — Encounter: Payer: Self-pay | Admitting: Student

## 2022-06-02 DIAGNOSIS — E1140 Type 2 diabetes mellitus with diabetic neuropathy, unspecified: Secondary | ICD-10-CM | POA: Diagnosis not present

## 2022-06-02 DIAGNOSIS — S42402D Unspecified fracture of lower end of left humerus, subsequent encounter for fracture with routine healing: Secondary | ICD-10-CM | POA: Diagnosis not present

## 2022-06-02 DIAGNOSIS — R2689 Other abnormalities of gait and mobility: Secondary | ICD-10-CM | POA: Diagnosis not present

## 2022-06-02 DIAGNOSIS — Z9181 History of falling: Secondary | ICD-10-CM | POA: Diagnosis not present

## 2022-06-02 DIAGNOSIS — M6281 Muscle weakness (generalized): Secondary | ICD-10-CM | POA: Diagnosis not present

## 2022-06-02 DIAGNOSIS — D696 Thrombocytopenia, unspecified: Secondary | ICD-10-CM | POA: Diagnosis not present

## 2022-06-02 DIAGNOSIS — R278 Other lack of coordination: Secondary | ICD-10-CM | POA: Diagnosis not present

## 2022-06-02 DIAGNOSIS — R2681 Unsteadiness on feet: Secondary | ICD-10-CM | POA: Diagnosis not present

## 2022-06-02 DIAGNOSIS — M19122 Post-traumatic osteoarthritis, left elbow: Secondary | ICD-10-CM | POA: Diagnosis not present

## 2022-06-02 DIAGNOSIS — S42201D Unspecified fracture of upper end of right humerus, subsequent encounter for fracture with routine healing: Secondary | ICD-10-CM | POA: Diagnosis not present

## 2022-06-02 DIAGNOSIS — Z950 Presence of cardiac pacemaker: Secondary | ICD-10-CM | POA: Diagnosis not present

## 2022-06-02 DIAGNOSIS — I495 Sick sinus syndrome: Secondary | ICD-10-CM | POA: Diagnosis not present

## 2022-06-02 DIAGNOSIS — R262 Difficulty in walking, not elsewhere classified: Secondary | ICD-10-CM | POA: Diagnosis not present

## 2022-06-02 DIAGNOSIS — M7022 Olecranon bursitis, left elbow: Secondary | ICD-10-CM | POA: Diagnosis not present

## 2022-06-02 DIAGNOSIS — I442 Atrioventricular block, complete: Secondary | ICD-10-CM | POA: Diagnosis not present

## 2022-06-03 DIAGNOSIS — R262 Difficulty in walking, not elsewhere classified: Secondary | ICD-10-CM | POA: Diagnosis not present

## 2022-06-03 DIAGNOSIS — M19122 Post-traumatic osteoarthritis, left elbow: Secondary | ICD-10-CM | POA: Diagnosis not present

## 2022-06-03 DIAGNOSIS — E1140 Type 2 diabetes mellitus with diabetic neuropathy, unspecified: Secondary | ICD-10-CM | POA: Diagnosis not present

## 2022-06-03 DIAGNOSIS — R278 Other lack of coordination: Secondary | ICD-10-CM | POA: Diagnosis not present

## 2022-06-03 DIAGNOSIS — R2681 Unsteadiness on feet: Secondary | ICD-10-CM | POA: Diagnosis not present

## 2022-06-03 DIAGNOSIS — M7022 Olecranon bursitis, left elbow: Secondary | ICD-10-CM | POA: Diagnosis not present

## 2022-06-03 DIAGNOSIS — D696 Thrombocytopenia, unspecified: Secondary | ICD-10-CM | POA: Diagnosis not present

## 2022-06-03 DIAGNOSIS — R2689 Other abnormalities of gait and mobility: Secondary | ICD-10-CM | POA: Diagnosis not present

## 2022-06-03 DIAGNOSIS — Z950 Presence of cardiac pacemaker: Secondary | ICD-10-CM | POA: Diagnosis not present

## 2022-06-03 DIAGNOSIS — Z9181 History of falling: Secondary | ICD-10-CM | POA: Diagnosis not present

## 2022-06-03 DIAGNOSIS — S42402D Unspecified fracture of lower end of left humerus, subsequent encounter for fracture with routine healing: Secondary | ICD-10-CM | POA: Diagnosis not present

## 2022-06-03 DIAGNOSIS — I442 Atrioventricular block, complete: Secondary | ICD-10-CM | POA: Diagnosis not present

## 2022-06-03 DIAGNOSIS — S42201D Unspecified fracture of upper end of right humerus, subsequent encounter for fracture with routine healing: Secondary | ICD-10-CM | POA: Diagnosis not present

## 2022-06-03 DIAGNOSIS — M6281 Muscle weakness (generalized): Secondary | ICD-10-CM | POA: Diagnosis not present

## 2022-06-03 DIAGNOSIS — I495 Sick sinus syndrome: Secondary | ICD-10-CM | POA: Diagnosis not present

## 2022-06-04 DIAGNOSIS — S42201D Unspecified fracture of upper end of right humerus, subsequent encounter for fracture with routine healing: Secondary | ICD-10-CM | POA: Diagnosis not present

## 2022-06-04 DIAGNOSIS — I495 Sick sinus syndrome: Secondary | ICD-10-CM | POA: Diagnosis not present

## 2022-06-04 DIAGNOSIS — S42402D Unspecified fracture of lower end of left humerus, subsequent encounter for fracture with routine healing: Secondary | ICD-10-CM | POA: Diagnosis not present

## 2022-06-04 DIAGNOSIS — I442 Atrioventricular block, complete: Secondary | ICD-10-CM | POA: Diagnosis not present

## 2022-06-04 DIAGNOSIS — E1140 Type 2 diabetes mellitus with diabetic neuropathy, unspecified: Secondary | ICD-10-CM | POA: Diagnosis not present

## 2022-06-04 DIAGNOSIS — Z9181 History of falling: Secondary | ICD-10-CM | POA: Diagnosis not present

## 2022-06-04 DIAGNOSIS — Z950 Presence of cardiac pacemaker: Secondary | ICD-10-CM | POA: Diagnosis not present

## 2022-06-04 DIAGNOSIS — M19122 Post-traumatic osteoarthritis, left elbow: Secondary | ICD-10-CM | POA: Diagnosis not present

## 2022-06-04 DIAGNOSIS — M6281 Muscle weakness (generalized): Secondary | ICD-10-CM | POA: Diagnosis not present

## 2022-06-04 DIAGNOSIS — M7022 Olecranon bursitis, left elbow: Secondary | ICD-10-CM | POA: Diagnosis not present

## 2022-06-04 DIAGNOSIS — R2689 Other abnormalities of gait and mobility: Secondary | ICD-10-CM | POA: Diagnosis not present

## 2022-06-04 DIAGNOSIS — R2681 Unsteadiness on feet: Secondary | ICD-10-CM | POA: Diagnosis not present

## 2022-06-04 DIAGNOSIS — R262 Difficulty in walking, not elsewhere classified: Secondary | ICD-10-CM | POA: Diagnosis not present

## 2022-06-04 DIAGNOSIS — R278 Other lack of coordination: Secondary | ICD-10-CM | POA: Diagnosis not present

## 2022-06-04 DIAGNOSIS — D696 Thrombocytopenia, unspecified: Secondary | ICD-10-CM | POA: Diagnosis not present

## 2022-06-06 ENCOUNTER — Ambulatory Visit (INDEPENDENT_AMBULATORY_CARE_PROVIDER_SITE_OTHER): Payer: Medicare Other | Admitting: Podiatry

## 2022-06-06 DIAGNOSIS — R2689 Other abnormalities of gait and mobility: Secondary | ICD-10-CM | POA: Diagnosis not present

## 2022-06-06 DIAGNOSIS — E1140 Type 2 diabetes mellitus with diabetic neuropathy, unspecified: Secondary | ICD-10-CM | POA: Diagnosis not present

## 2022-06-06 DIAGNOSIS — R2681 Unsteadiness on feet: Secondary | ICD-10-CM | POA: Diagnosis not present

## 2022-06-06 DIAGNOSIS — R262 Difficulty in walking, not elsewhere classified: Secondary | ICD-10-CM | POA: Diagnosis not present

## 2022-06-06 DIAGNOSIS — S42201D Unspecified fracture of upper end of right humerus, subsequent encounter for fracture with routine healing: Secondary | ICD-10-CM | POA: Diagnosis not present

## 2022-06-06 DIAGNOSIS — I495 Sick sinus syndrome: Secondary | ICD-10-CM | POA: Diagnosis not present

## 2022-06-06 DIAGNOSIS — M7022 Olecranon bursitis, left elbow: Secondary | ICD-10-CM | POA: Diagnosis not present

## 2022-06-06 DIAGNOSIS — M19122 Post-traumatic osteoarthritis, left elbow: Secondary | ICD-10-CM | POA: Diagnosis not present

## 2022-06-06 DIAGNOSIS — M79674 Pain in right toe(s): Secondary | ICD-10-CM | POA: Diagnosis not present

## 2022-06-06 DIAGNOSIS — S42402D Unspecified fracture of lower end of left humerus, subsequent encounter for fracture with routine healing: Secondary | ICD-10-CM | POA: Diagnosis not present

## 2022-06-06 DIAGNOSIS — M79675 Pain in left toe(s): Secondary | ICD-10-CM | POA: Diagnosis not present

## 2022-06-06 DIAGNOSIS — R278 Other lack of coordination: Secondary | ICD-10-CM | POA: Diagnosis not present

## 2022-06-06 DIAGNOSIS — I442 Atrioventricular block, complete: Secondary | ICD-10-CM | POA: Diagnosis not present

## 2022-06-06 DIAGNOSIS — D696 Thrombocytopenia, unspecified: Secondary | ICD-10-CM | POA: Diagnosis not present

## 2022-06-06 DIAGNOSIS — B351 Tinea unguium: Secondary | ICD-10-CM | POA: Diagnosis not present

## 2022-06-06 DIAGNOSIS — M6281 Muscle weakness (generalized): Secondary | ICD-10-CM | POA: Diagnosis not present

## 2022-06-06 DIAGNOSIS — Z950 Presence of cardiac pacemaker: Secondary | ICD-10-CM | POA: Diagnosis not present

## 2022-06-06 DIAGNOSIS — Z9181 History of falling: Secondary | ICD-10-CM | POA: Diagnosis not present

## 2022-06-07 DIAGNOSIS — R2689 Other abnormalities of gait and mobility: Secondary | ICD-10-CM | POA: Diagnosis not present

## 2022-06-07 DIAGNOSIS — S42402D Unspecified fracture of lower end of left humerus, subsequent encounter for fracture with routine healing: Secondary | ICD-10-CM | POA: Diagnosis not present

## 2022-06-07 DIAGNOSIS — R278 Other lack of coordination: Secondary | ICD-10-CM | POA: Diagnosis not present

## 2022-06-07 DIAGNOSIS — R262 Difficulty in walking, not elsewhere classified: Secondary | ICD-10-CM | POA: Diagnosis not present

## 2022-06-07 DIAGNOSIS — M6281 Muscle weakness (generalized): Secondary | ICD-10-CM | POA: Diagnosis not present

## 2022-06-07 DIAGNOSIS — M19122 Post-traumatic osteoarthritis, left elbow: Secondary | ICD-10-CM | POA: Diagnosis not present

## 2022-06-07 DIAGNOSIS — S42201D Unspecified fracture of upper end of right humerus, subsequent encounter for fracture with routine healing: Secondary | ICD-10-CM | POA: Diagnosis not present

## 2022-06-07 DIAGNOSIS — I495 Sick sinus syndrome: Secondary | ICD-10-CM | POA: Diagnosis not present

## 2022-06-07 DIAGNOSIS — R2681 Unsteadiness on feet: Secondary | ICD-10-CM | POA: Diagnosis not present

## 2022-06-07 DIAGNOSIS — Z950 Presence of cardiac pacemaker: Secondary | ICD-10-CM | POA: Diagnosis not present

## 2022-06-07 DIAGNOSIS — E1140 Type 2 diabetes mellitus with diabetic neuropathy, unspecified: Secondary | ICD-10-CM | POA: Diagnosis not present

## 2022-06-07 DIAGNOSIS — D696 Thrombocytopenia, unspecified: Secondary | ICD-10-CM | POA: Diagnosis not present

## 2022-06-07 DIAGNOSIS — Z9181 History of falling: Secondary | ICD-10-CM | POA: Diagnosis not present

## 2022-06-07 DIAGNOSIS — M7022 Olecranon bursitis, left elbow: Secondary | ICD-10-CM | POA: Diagnosis not present

## 2022-06-07 DIAGNOSIS — I442 Atrioventricular block, complete: Secondary | ICD-10-CM | POA: Diagnosis not present

## 2022-06-09 ENCOUNTER — Encounter: Payer: Self-pay | Admitting: Podiatry

## 2022-06-09 DIAGNOSIS — R262 Difficulty in walking, not elsewhere classified: Secondary | ICD-10-CM | POA: Diagnosis not present

## 2022-06-09 DIAGNOSIS — R2689 Other abnormalities of gait and mobility: Secondary | ICD-10-CM | POA: Diagnosis not present

## 2022-06-09 DIAGNOSIS — R2681 Unsteadiness on feet: Secondary | ICD-10-CM | POA: Diagnosis not present

## 2022-06-09 DIAGNOSIS — I442 Atrioventricular block, complete: Secondary | ICD-10-CM | POA: Diagnosis not present

## 2022-06-09 DIAGNOSIS — M19122 Post-traumatic osteoarthritis, left elbow: Secondary | ICD-10-CM | POA: Diagnosis not present

## 2022-06-09 DIAGNOSIS — I495 Sick sinus syndrome: Secondary | ICD-10-CM | POA: Diagnosis not present

## 2022-06-09 DIAGNOSIS — M6281 Muscle weakness (generalized): Secondary | ICD-10-CM | POA: Diagnosis not present

## 2022-06-09 DIAGNOSIS — E1140 Type 2 diabetes mellitus with diabetic neuropathy, unspecified: Secondary | ICD-10-CM | POA: Diagnosis not present

## 2022-06-09 DIAGNOSIS — D696 Thrombocytopenia, unspecified: Secondary | ICD-10-CM | POA: Diagnosis not present

## 2022-06-09 DIAGNOSIS — Z950 Presence of cardiac pacemaker: Secondary | ICD-10-CM | POA: Diagnosis not present

## 2022-06-09 DIAGNOSIS — M7022 Olecranon bursitis, left elbow: Secondary | ICD-10-CM | POA: Diagnosis not present

## 2022-06-09 DIAGNOSIS — R278 Other lack of coordination: Secondary | ICD-10-CM | POA: Diagnosis not present

## 2022-06-09 DIAGNOSIS — M720 Palmar fascial fibromatosis [Dupuytren]: Secondary | ICD-10-CM | POA: Diagnosis not present

## 2022-06-09 DIAGNOSIS — Z9181 History of falling: Secondary | ICD-10-CM | POA: Diagnosis not present

## 2022-06-09 DIAGNOSIS — S42201D Unspecified fracture of upper end of right humerus, subsequent encounter for fracture with routine healing: Secondary | ICD-10-CM | POA: Diagnosis not present

## 2022-06-09 DIAGNOSIS — S42402D Unspecified fracture of lower end of left humerus, subsequent encounter for fracture with routine healing: Secondary | ICD-10-CM | POA: Diagnosis not present

## 2022-06-09 DIAGNOSIS — L97921 Non-pressure chronic ulcer of unspecified part of left lower leg limited to breakdown of skin: Secondary | ICD-10-CM | POA: Diagnosis not present

## 2022-06-09 NOTE — Progress Notes (Signed)
  Subjective:  Patient ID: Michael Colon, male    DOB: 1930/07/21,  MRN: [id number]  Michael Colon presents to clinic today for at risk foot care with history of diabetic neuropathy and painful elongated mycotic toenails 1-5 bilaterally which are tender when wearing enclosed shoe gear. Pain is relieved with periodic professional debridement. He is accompanied by his caregiver on today's visit. He is a resident of [place]. Chief Complaint  Patient presents with   Nail Problem    RFC , LOV: 05/24,Referring Provider [name], MD,      New problem(s): None.   PCP is [name], MD.  Allergies  Allergen Reactions   Lovenox [Enoxaparin] Hives   Proscar [Finasteride] Other (See Comments)    Upper body and arm weakness   Latex Rash    Has trouble with BANDAIDS that have been left on for more than 24 hours. Prefers paper tape.   Nickel Rash    Localized rash   Percocet [Oxycodone-Acetaminophen] Rash   Tape Rash and Other (See Comments)    Sensitivity     Review of Systems: Negative except as noted in the HPI.  Objective: No changes noted in today's physical examination. There were no vitals filed for this visit. Michael Colon is a pleasant 89 y.o. male WD, WN in NAD. AAO x 3.  Vascular Examination: CFT <3 seconds b/l. DP/PT pulses faintly palpable b/l. Skin temperature gradient warm to warm b/l. No pain with calf compression. No ischemia or gangrene. No cyanosis or clubbing noted b/l. Pedal hair absent.   Neurological Examination: Sensation grossly intact b/l with 10 gram monofilament. Vibratory sensation intact b/l. Pt has subjective symptoms of neuropathy.  Dermatological Examination: Pedal skin warm and supple b/l.   No open wounds. No interdigital macerations.  Toenails 1-5 b/l thick, discolored, elongated with subungual debris and pain on dorsal palpation.    No hyperkeratotic nor porokeratotic lesions present on today's visit.  Musculoskeletal  Examination: Muscle strength 5/5 to b/l LE. Hammertoe deformity noted 2-5 b/l. Utilizes wheelchair for mobility assistance.  Radiographs: None  Last A1c:      05/05/2022   12:00 AM  Hemoglobin A1C  Hemoglobin-A1c 7.1         This result is from an external source.   Assessment/Plan: 1. Pain due to onychomycosis of toenails of both feet   2. Type 2 diabetes, controlled, with neuropathy (HCC)      -Patient was evaluated and treated. All patient's and/or POA's questions/concerns answered on today's visit. -Facility to continue fall precautions and pressure precautions. -Patient to continue soft, supportive shoe gear daily. -Toenails 1-5 b/l were debrided in length and girth with sterile nail nippers and dremel without iatrogenic bleeding.  -Patient/POA to call should there be question/concern in the interim.   Return in about 3 months (around 09/06/2022).  Freddie Breech, DPM

## 2022-06-11 DIAGNOSIS — S42201D Unspecified fracture of upper end of right humerus, subsequent encounter for fracture with routine healing: Secondary | ICD-10-CM | POA: Diagnosis not present

## 2022-06-11 DIAGNOSIS — D696 Thrombocytopenia, unspecified: Secondary | ICD-10-CM | POA: Diagnosis not present

## 2022-06-11 DIAGNOSIS — R278 Other lack of coordination: Secondary | ICD-10-CM | POA: Diagnosis not present

## 2022-06-11 DIAGNOSIS — E1140 Type 2 diabetes mellitus with diabetic neuropathy, unspecified: Secondary | ICD-10-CM | POA: Diagnosis not present

## 2022-06-11 DIAGNOSIS — M6281 Muscle weakness (generalized): Secondary | ICD-10-CM | POA: Diagnosis not present

## 2022-06-11 DIAGNOSIS — R2681 Unsteadiness on feet: Secondary | ICD-10-CM | POA: Diagnosis not present

## 2022-06-11 DIAGNOSIS — Z950 Presence of cardiac pacemaker: Secondary | ICD-10-CM | POA: Diagnosis not present

## 2022-06-11 DIAGNOSIS — S42402D Unspecified fracture of lower end of left humerus, subsequent encounter for fracture with routine healing: Secondary | ICD-10-CM | POA: Diagnosis not present

## 2022-06-11 DIAGNOSIS — Z9181 History of falling: Secondary | ICD-10-CM | POA: Diagnosis not present

## 2022-06-11 DIAGNOSIS — I495 Sick sinus syndrome: Secondary | ICD-10-CM | POA: Diagnosis not present

## 2022-06-11 DIAGNOSIS — I442 Atrioventricular block, complete: Secondary | ICD-10-CM | POA: Diagnosis not present

## 2022-06-11 DIAGNOSIS — M19122 Post-traumatic osteoarthritis, left elbow: Secondary | ICD-10-CM | POA: Diagnosis not present

## 2022-06-11 DIAGNOSIS — R2689 Other abnormalities of gait and mobility: Secondary | ICD-10-CM | POA: Diagnosis not present

## 2022-06-11 DIAGNOSIS — R262 Difficulty in walking, not elsewhere classified: Secondary | ICD-10-CM | POA: Diagnosis not present

## 2022-06-11 DIAGNOSIS — M7022 Olecranon bursitis, left elbow: Secondary | ICD-10-CM | POA: Diagnosis not present

## 2022-06-13 ENCOUNTER — Ambulatory Visit (INDEPENDENT_AMBULATORY_CARE_PROVIDER_SITE_OTHER): Payer: Medicare Other

## 2022-06-13 DIAGNOSIS — M6281 Muscle weakness (generalized): Secondary | ICD-10-CM | POA: Diagnosis not present

## 2022-06-13 DIAGNOSIS — R262 Difficulty in walking, not elsewhere classified: Secondary | ICD-10-CM | POA: Diagnosis not present

## 2022-06-13 DIAGNOSIS — R2681 Unsteadiness on feet: Secondary | ICD-10-CM | POA: Diagnosis not present

## 2022-06-13 DIAGNOSIS — I442 Atrioventricular block, complete: Secondary | ICD-10-CM

## 2022-06-13 DIAGNOSIS — S42402D Unspecified fracture of lower end of left humerus, subsequent encounter for fracture with routine healing: Secondary | ICD-10-CM | POA: Diagnosis not present

## 2022-06-13 DIAGNOSIS — S42201D Unspecified fracture of upper end of right humerus, subsequent encounter for fracture with routine healing: Secondary | ICD-10-CM | POA: Diagnosis not present

## 2022-06-13 DIAGNOSIS — I495 Sick sinus syndrome: Secondary | ICD-10-CM | POA: Diagnosis not present

## 2022-06-13 DIAGNOSIS — E1140 Type 2 diabetes mellitus with diabetic neuropathy, unspecified: Secondary | ICD-10-CM | POA: Diagnosis not present

## 2022-06-13 DIAGNOSIS — Z950 Presence of cardiac pacemaker: Secondary | ICD-10-CM | POA: Diagnosis not present

## 2022-06-13 DIAGNOSIS — D696 Thrombocytopenia, unspecified: Secondary | ICD-10-CM | POA: Diagnosis not present

## 2022-06-13 DIAGNOSIS — M19122 Post-traumatic osteoarthritis, left elbow: Secondary | ICD-10-CM | POA: Diagnosis not present

## 2022-06-13 DIAGNOSIS — M7022 Olecranon bursitis, left elbow: Secondary | ICD-10-CM | POA: Diagnosis not present

## 2022-06-13 DIAGNOSIS — Z9181 History of falling: Secondary | ICD-10-CM | POA: Diagnosis not present

## 2022-06-13 DIAGNOSIS — R2689 Other abnormalities of gait and mobility: Secondary | ICD-10-CM | POA: Diagnosis not present

## 2022-06-13 DIAGNOSIS — R278 Other lack of coordination: Secondary | ICD-10-CM | POA: Diagnosis not present

## 2022-06-14 DIAGNOSIS — R2689 Other abnormalities of gait and mobility: Secondary | ICD-10-CM | POA: Diagnosis not present

## 2022-06-14 DIAGNOSIS — D696 Thrombocytopenia, unspecified: Secondary | ICD-10-CM | POA: Diagnosis not present

## 2022-06-14 DIAGNOSIS — I495 Sick sinus syndrome: Secondary | ICD-10-CM | POA: Diagnosis not present

## 2022-06-14 DIAGNOSIS — M6281 Muscle weakness (generalized): Secondary | ICD-10-CM | POA: Diagnosis not present

## 2022-06-14 DIAGNOSIS — M7022 Olecranon bursitis, left elbow: Secondary | ICD-10-CM | POA: Diagnosis not present

## 2022-06-14 DIAGNOSIS — Z9181 History of falling: Secondary | ICD-10-CM | POA: Diagnosis not present

## 2022-06-14 DIAGNOSIS — R278 Other lack of coordination: Secondary | ICD-10-CM | POA: Diagnosis not present

## 2022-06-14 DIAGNOSIS — I442 Atrioventricular block, complete: Secondary | ICD-10-CM | POA: Diagnosis not present

## 2022-06-14 DIAGNOSIS — Z950 Presence of cardiac pacemaker: Secondary | ICD-10-CM | POA: Diagnosis not present

## 2022-06-14 DIAGNOSIS — E1140 Type 2 diabetes mellitus with diabetic neuropathy, unspecified: Secondary | ICD-10-CM | POA: Diagnosis not present

## 2022-06-14 DIAGNOSIS — M19122 Post-traumatic osteoarthritis, left elbow: Secondary | ICD-10-CM | POA: Diagnosis not present

## 2022-06-14 DIAGNOSIS — R262 Difficulty in walking, not elsewhere classified: Secondary | ICD-10-CM | POA: Diagnosis not present

## 2022-06-14 DIAGNOSIS — S42402D Unspecified fracture of lower end of left humerus, subsequent encounter for fracture with routine healing: Secondary | ICD-10-CM | POA: Diagnosis not present

## 2022-06-14 DIAGNOSIS — R2681 Unsteadiness on feet: Secondary | ICD-10-CM | POA: Diagnosis not present

## 2022-06-14 DIAGNOSIS — S42201D Unspecified fracture of upper end of right humerus, subsequent encounter for fracture with routine healing: Secondary | ICD-10-CM | POA: Diagnosis not present

## 2022-06-14 LAB — CUP PACEART REMOTE DEVICE CHECK
Battery Remaining Longevity: 120 mo
Battery Remaining Percentage: 100 %
Brady Statistic RA Percent Paced: 16 %
Brady Statistic RV Percent Paced: 93 %
Date Time Interrogation Session: 20240610000000
Implantable Lead Connection Status: 753985
Implantable Lead Connection Status: 753985
Implantable Lead Implant Date: 20230831
Implantable Lead Implant Date: 20230831
Implantable Lead Location: 753859
Implantable Lead Location: 753860
Implantable Lead Model: 7841
Implantable Lead Model: 7842
Implantable Lead Serial Number: 1220926
Implantable Lead Serial Number: 1322603
Implantable Pulse Generator Implant Date: 20230831
Lead Channel Impedance Value: 501 Ohm
Lead Channel Impedance Value: 519 Ohm
Lead Channel Pacing Threshold Amplitude: 0.5 V
Lead Channel Pacing Threshold Amplitude: 1.2 V
Lead Channel Pacing Threshold Pulse Width: 0.4 ms
Lead Channel Pacing Threshold Pulse Width: 0.4 ms
Lead Channel Setting Pacing Amplitude: 3.5 V
Lead Channel Setting Pacing Amplitude: 3.5 V
Lead Channel Setting Pacing Pulse Width: 0.4 ms
Lead Channel Setting Sensing Sensitivity: 3.5 mV
Pulse Gen Serial Number: 613820
Zone Setting Status: 755011

## 2022-06-15 DIAGNOSIS — I442 Atrioventricular block, complete: Secondary | ICD-10-CM | POA: Diagnosis not present

## 2022-06-15 DIAGNOSIS — Z9181 History of falling: Secondary | ICD-10-CM | POA: Diagnosis not present

## 2022-06-15 DIAGNOSIS — E1140 Type 2 diabetes mellitus with diabetic neuropathy, unspecified: Secondary | ICD-10-CM | POA: Diagnosis not present

## 2022-06-15 DIAGNOSIS — M6281 Muscle weakness (generalized): Secondary | ICD-10-CM | POA: Diagnosis not present

## 2022-06-15 DIAGNOSIS — R2689 Other abnormalities of gait and mobility: Secondary | ICD-10-CM | POA: Diagnosis not present

## 2022-06-15 DIAGNOSIS — R262 Difficulty in walking, not elsewhere classified: Secondary | ICD-10-CM | POA: Diagnosis not present

## 2022-06-15 DIAGNOSIS — M19122 Post-traumatic osteoarthritis, left elbow: Secondary | ICD-10-CM | POA: Diagnosis not present

## 2022-06-15 DIAGNOSIS — Z950 Presence of cardiac pacemaker: Secondary | ICD-10-CM | POA: Diagnosis not present

## 2022-06-15 DIAGNOSIS — M7022 Olecranon bursitis, left elbow: Secondary | ICD-10-CM | POA: Diagnosis not present

## 2022-06-15 DIAGNOSIS — S42201D Unspecified fracture of upper end of right humerus, subsequent encounter for fracture with routine healing: Secondary | ICD-10-CM | POA: Diagnosis not present

## 2022-06-15 DIAGNOSIS — I495 Sick sinus syndrome: Secondary | ICD-10-CM | POA: Diagnosis not present

## 2022-06-15 DIAGNOSIS — R2681 Unsteadiness on feet: Secondary | ICD-10-CM | POA: Diagnosis not present

## 2022-06-15 DIAGNOSIS — R278 Other lack of coordination: Secondary | ICD-10-CM | POA: Diagnosis not present

## 2022-06-15 DIAGNOSIS — D696 Thrombocytopenia, unspecified: Secondary | ICD-10-CM | POA: Diagnosis not present

## 2022-06-15 DIAGNOSIS — S42402D Unspecified fracture of lower end of left humerus, subsequent encounter for fracture with routine healing: Secondary | ICD-10-CM | POA: Diagnosis not present

## 2022-06-16 DIAGNOSIS — M7022 Olecranon bursitis, left elbow: Secondary | ICD-10-CM | POA: Diagnosis not present

## 2022-06-16 DIAGNOSIS — S42201D Unspecified fracture of upper end of right humerus, subsequent encounter for fracture with routine healing: Secondary | ICD-10-CM | POA: Diagnosis not present

## 2022-06-16 DIAGNOSIS — R262 Difficulty in walking, not elsewhere classified: Secondary | ICD-10-CM | POA: Diagnosis not present

## 2022-06-16 DIAGNOSIS — Z9181 History of falling: Secondary | ICD-10-CM | POA: Diagnosis not present

## 2022-06-16 DIAGNOSIS — M6281 Muscle weakness (generalized): Secondary | ICD-10-CM | POA: Diagnosis not present

## 2022-06-16 DIAGNOSIS — R2681 Unsteadiness on feet: Secondary | ICD-10-CM | POA: Diagnosis not present

## 2022-06-16 DIAGNOSIS — R2689 Other abnormalities of gait and mobility: Secondary | ICD-10-CM | POA: Diagnosis not present

## 2022-06-16 DIAGNOSIS — E1140 Type 2 diabetes mellitus with diabetic neuropathy, unspecified: Secondary | ICD-10-CM | POA: Diagnosis not present

## 2022-06-16 DIAGNOSIS — R278 Other lack of coordination: Secondary | ICD-10-CM | POA: Diagnosis not present

## 2022-06-16 DIAGNOSIS — M19122 Post-traumatic osteoarthritis, left elbow: Secondary | ICD-10-CM | POA: Diagnosis not present

## 2022-06-16 DIAGNOSIS — D696 Thrombocytopenia, unspecified: Secondary | ICD-10-CM | POA: Diagnosis not present

## 2022-06-16 DIAGNOSIS — S42402D Unspecified fracture of lower end of left humerus, subsequent encounter for fracture with routine healing: Secondary | ICD-10-CM | POA: Diagnosis not present

## 2022-06-16 DIAGNOSIS — I495 Sick sinus syndrome: Secondary | ICD-10-CM | POA: Diagnosis not present

## 2022-06-16 DIAGNOSIS — Z950 Presence of cardiac pacemaker: Secondary | ICD-10-CM | POA: Diagnosis not present

## 2022-06-16 DIAGNOSIS — I442 Atrioventricular block, complete: Secondary | ICD-10-CM | POA: Diagnosis not present

## 2022-06-20 DIAGNOSIS — Z9181 History of falling: Secondary | ICD-10-CM | POA: Diagnosis not present

## 2022-06-20 DIAGNOSIS — R2681 Unsteadiness on feet: Secondary | ICD-10-CM | POA: Diagnosis not present

## 2022-06-20 DIAGNOSIS — M6281 Muscle weakness (generalized): Secondary | ICD-10-CM | POA: Diagnosis not present

## 2022-06-20 DIAGNOSIS — Z950 Presence of cardiac pacemaker: Secondary | ICD-10-CM | POA: Diagnosis not present

## 2022-06-20 DIAGNOSIS — I495 Sick sinus syndrome: Secondary | ICD-10-CM | POA: Diagnosis not present

## 2022-06-20 DIAGNOSIS — M7022 Olecranon bursitis, left elbow: Secondary | ICD-10-CM | POA: Diagnosis not present

## 2022-06-20 DIAGNOSIS — R2689 Other abnormalities of gait and mobility: Secondary | ICD-10-CM | POA: Diagnosis not present

## 2022-06-20 DIAGNOSIS — S42201D Unspecified fracture of upper end of right humerus, subsequent encounter for fracture with routine healing: Secondary | ICD-10-CM | POA: Diagnosis not present

## 2022-06-20 DIAGNOSIS — M19122 Post-traumatic osteoarthritis, left elbow: Secondary | ICD-10-CM | POA: Diagnosis not present

## 2022-06-20 DIAGNOSIS — R278 Other lack of coordination: Secondary | ICD-10-CM | POA: Diagnosis not present

## 2022-06-20 DIAGNOSIS — E1140 Type 2 diabetes mellitus with diabetic neuropathy, unspecified: Secondary | ICD-10-CM | POA: Diagnosis not present

## 2022-06-20 DIAGNOSIS — R262 Difficulty in walking, not elsewhere classified: Secondary | ICD-10-CM | POA: Diagnosis not present

## 2022-06-20 DIAGNOSIS — I442 Atrioventricular block, complete: Secondary | ICD-10-CM | POA: Diagnosis not present

## 2022-06-20 DIAGNOSIS — D696 Thrombocytopenia, unspecified: Secondary | ICD-10-CM | POA: Diagnosis not present

## 2022-06-20 DIAGNOSIS — S42402D Unspecified fracture of lower end of left humerus, subsequent encounter for fracture with routine healing: Secondary | ICD-10-CM | POA: Diagnosis not present

## 2022-06-21 DIAGNOSIS — I442 Atrioventricular block, complete: Secondary | ICD-10-CM | POA: Diagnosis not present

## 2022-06-21 DIAGNOSIS — M6281 Muscle weakness (generalized): Secondary | ICD-10-CM | POA: Diagnosis not present

## 2022-06-21 DIAGNOSIS — Z950 Presence of cardiac pacemaker: Secondary | ICD-10-CM | POA: Diagnosis not present

## 2022-06-21 DIAGNOSIS — R2689 Other abnormalities of gait and mobility: Secondary | ICD-10-CM | POA: Diagnosis not present

## 2022-06-21 DIAGNOSIS — M19122 Post-traumatic osteoarthritis, left elbow: Secondary | ICD-10-CM | POA: Diagnosis not present

## 2022-06-21 DIAGNOSIS — S42402D Unspecified fracture of lower end of left humerus, subsequent encounter for fracture with routine healing: Secondary | ICD-10-CM | POA: Diagnosis not present

## 2022-06-21 DIAGNOSIS — R262 Difficulty in walking, not elsewhere classified: Secondary | ICD-10-CM | POA: Diagnosis not present

## 2022-06-21 DIAGNOSIS — M7022 Olecranon bursitis, left elbow: Secondary | ICD-10-CM | POA: Diagnosis not present

## 2022-06-21 DIAGNOSIS — R2681 Unsteadiness on feet: Secondary | ICD-10-CM | POA: Diagnosis not present

## 2022-06-21 DIAGNOSIS — D696 Thrombocytopenia, unspecified: Secondary | ICD-10-CM | POA: Diagnosis not present

## 2022-06-21 DIAGNOSIS — S42201D Unspecified fracture of upper end of right humerus, subsequent encounter for fracture with routine healing: Secondary | ICD-10-CM | POA: Diagnosis not present

## 2022-06-21 DIAGNOSIS — I495 Sick sinus syndrome: Secondary | ICD-10-CM | POA: Diagnosis not present

## 2022-06-21 DIAGNOSIS — Z9181 History of falling: Secondary | ICD-10-CM | POA: Diagnosis not present

## 2022-06-21 DIAGNOSIS — R278 Other lack of coordination: Secondary | ICD-10-CM | POA: Diagnosis not present

## 2022-06-21 DIAGNOSIS — E1140 Type 2 diabetes mellitus with diabetic neuropathy, unspecified: Secondary | ICD-10-CM | POA: Diagnosis not present

## 2022-06-22 DIAGNOSIS — I495 Sick sinus syndrome: Secondary | ICD-10-CM | POA: Diagnosis not present

## 2022-06-22 DIAGNOSIS — E1140 Type 2 diabetes mellitus with diabetic neuropathy, unspecified: Secondary | ICD-10-CM | POA: Diagnosis not present

## 2022-06-22 DIAGNOSIS — M7022 Olecranon bursitis, left elbow: Secondary | ICD-10-CM | POA: Diagnosis not present

## 2022-06-22 DIAGNOSIS — R2689 Other abnormalities of gait and mobility: Secondary | ICD-10-CM | POA: Diagnosis not present

## 2022-06-22 DIAGNOSIS — M6281 Muscle weakness (generalized): Secondary | ICD-10-CM | POA: Diagnosis not present

## 2022-06-22 DIAGNOSIS — Z950 Presence of cardiac pacemaker: Secondary | ICD-10-CM | POA: Diagnosis not present

## 2022-06-22 DIAGNOSIS — S42402D Unspecified fracture of lower end of left humerus, subsequent encounter for fracture with routine healing: Secondary | ICD-10-CM | POA: Diagnosis not present

## 2022-06-22 DIAGNOSIS — I442 Atrioventricular block, complete: Secondary | ICD-10-CM | POA: Diagnosis not present

## 2022-06-22 DIAGNOSIS — D696 Thrombocytopenia, unspecified: Secondary | ICD-10-CM | POA: Diagnosis not present

## 2022-06-22 DIAGNOSIS — Z9181 History of falling: Secondary | ICD-10-CM | POA: Diagnosis not present

## 2022-06-22 DIAGNOSIS — M19122 Post-traumatic osteoarthritis, left elbow: Secondary | ICD-10-CM | POA: Diagnosis not present

## 2022-06-22 DIAGNOSIS — R262 Difficulty in walking, not elsewhere classified: Secondary | ICD-10-CM | POA: Diagnosis not present

## 2022-06-22 DIAGNOSIS — R278 Other lack of coordination: Secondary | ICD-10-CM | POA: Diagnosis not present

## 2022-06-22 DIAGNOSIS — R2681 Unsteadiness on feet: Secondary | ICD-10-CM | POA: Diagnosis not present

## 2022-06-22 DIAGNOSIS — S42201D Unspecified fracture of upper end of right humerus, subsequent encounter for fracture with routine healing: Secondary | ICD-10-CM | POA: Diagnosis not present

## 2022-06-24 DIAGNOSIS — S42201D Unspecified fracture of upper end of right humerus, subsequent encounter for fracture with routine healing: Secondary | ICD-10-CM | POA: Diagnosis not present

## 2022-06-24 DIAGNOSIS — M6281 Muscle weakness (generalized): Secondary | ICD-10-CM | POA: Diagnosis not present

## 2022-06-24 DIAGNOSIS — Z9181 History of falling: Secondary | ICD-10-CM | POA: Diagnosis not present

## 2022-06-24 DIAGNOSIS — M19122 Post-traumatic osteoarthritis, left elbow: Secondary | ICD-10-CM | POA: Diagnosis not present

## 2022-06-24 DIAGNOSIS — R2689 Other abnormalities of gait and mobility: Secondary | ICD-10-CM | POA: Diagnosis not present

## 2022-06-24 DIAGNOSIS — Z950 Presence of cardiac pacemaker: Secondary | ICD-10-CM | POA: Diagnosis not present

## 2022-06-24 DIAGNOSIS — I442 Atrioventricular block, complete: Secondary | ICD-10-CM | POA: Diagnosis not present

## 2022-06-24 DIAGNOSIS — M7022 Olecranon bursitis, left elbow: Secondary | ICD-10-CM | POA: Diagnosis not present

## 2022-06-24 DIAGNOSIS — I495 Sick sinus syndrome: Secondary | ICD-10-CM | POA: Diagnosis not present

## 2022-06-24 DIAGNOSIS — S42402D Unspecified fracture of lower end of left humerus, subsequent encounter for fracture with routine healing: Secondary | ICD-10-CM | POA: Diagnosis not present

## 2022-06-24 DIAGNOSIS — R278 Other lack of coordination: Secondary | ICD-10-CM | POA: Diagnosis not present

## 2022-06-24 DIAGNOSIS — R262 Difficulty in walking, not elsewhere classified: Secondary | ICD-10-CM | POA: Diagnosis not present

## 2022-06-24 DIAGNOSIS — D696 Thrombocytopenia, unspecified: Secondary | ICD-10-CM | POA: Diagnosis not present

## 2022-06-24 DIAGNOSIS — E1140 Type 2 diabetes mellitus with diabetic neuropathy, unspecified: Secondary | ICD-10-CM | POA: Diagnosis not present

## 2022-06-24 DIAGNOSIS — R2681 Unsteadiness on feet: Secondary | ICD-10-CM | POA: Diagnosis not present

## 2022-06-27 DIAGNOSIS — I442 Atrioventricular block, complete: Secondary | ICD-10-CM | POA: Diagnosis not present

## 2022-06-27 DIAGNOSIS — M19122 Post-traumatic osteoarthritis, left elbow: Secondary | ICD-10-CM | POA: Diagnosis not present

## 2022-06-27 DIAGNOSIS — I495 Sick sinus syndrome: Secondary | ICD-10-CM | POA: Diagnosis not present

## 2022-06-27 DIAGNOSIS — R2689 Other abnormalities of gait and mobility: Secondary | ICD-10-CM | POA: Diagnosis not present

## 2022-06-27 DIAGNOSIS — R262 Difficulty in walking, not elsewhere classified: Secondary | ICD-10-CM | POA: Diagnosis not present

## 2022-06-27 DIAGNOSIS — M7022 Olecranon bursitis, left elbow: Secondary | ICD-10-CM | POA: Diagnosis not present

## 2022-06-27 DIAGNOSIS — R2681 Unsteadiness on feet: Secondary | ICD-10-CM | POA: Diagnosis not present

## 2022-06-27 DIAGNOSIS — Z9181 History of falling: Secondary | ICD-10-CM | POA: Diagnosis not present

## 2022-06-27 DIAGNOSIS — E1140 Type 2 diabetes mellitus with diabetic neuropathy, unspecified: Secondary | ICD-10-CM | POA: Diagnosis not present

## 2022-06-27 DIAGNOSIS — S42402D Unspecified fracture of lower end of left humerus, subsequent encounter for fracture with routine healing: Secondary | ICD-10-CM | POA: Diagnosis not present

## 2022-06-27 DIAGNOSIS — Z950 Presence of cardiac pacemaker: Secondary | ICD-10-CM | POA: Diagnosis not present

## 2022-06-27 DIAGNOSIS — R278 Other lack of coordination: Secondary | ICD-10-CM | POA: Diagnosis not present

## 2022-06-27 DIAGNOSIS — M6281 Muscle weakness (generalized): Secondary | ICD-10-CM | POA: Diagnosis not present

## 2022-06-27 DIAGNOSIS — D696 Thrombocytopenia, unspecified: Secondary | ICD-10-CM | POA: Diagnosis not present

## 2022-06-27 DIAGNOSIS — S42201D Unspecified fracture of upper end of right humerus, subsequent encounter for fracture with routine healing: Secondary | ICD-10-CM | POA: Diagnosis not present

## 2022-06-28 ENCOUNTER — Encounter: Payer: Self-pay | Admitting: Nurse Practitioner

## 2022-06-28 ENCOUNTER — Non-Acute Institutional Stay (INDEPENDENT_AMBULATORY_CARE_PROVIDER_SITE_OTHER): Payer: Medicare Other | Admitting: Nurse Practitioner

## 2022-06-28 DIAGNOSIS — Z0000 Encounter for general adult medical examination without abnormal findings: Secondary | ICD-10-CM

## 2022-06-28 DIAGNOSIS — R278 Other lack of coordination: Secondary | ICD-10-CM | POA: Diagnosis not present

## 2022-06-28 DIAGNOSIS — R262 Difficulty in walking, not elsewhere classified: Secondary | ICD-10-CM | POA: Diagnosis not present

## 2022-06-28 DIAGNOSIS — E1140 Type 2 diabetes mellitus with diabetic neuropathy, unspecified: Secondary | ICD-10-CM | POA: Diagnosis not present

## 2022-06-28 DIAGNOSIS — R2689 Other abnormalities of gait and mobility: Secondary | ICD-10-CM | POA: Diagnosis not present

## 2022-06-28 DIAGNOSIS — S42201D Unspecified fracture of upper end of right humerus, subsequent encounter for fracture with routine healing: Secondary | ICD-10-CM | POA: Diagnosis not present

## 2022-06-28 DIAGNOSIS — M7022 Olecranon bursitis, left elbow: Secondary | ICD-10-CM | POA: Diagnosis not present

## 2022-06-28 DIAGNOSIS — S42402D Unspecified fracture of lower end of left humerus, subsequent encounter for fracture with routine healing: Secondary | ICD-10-CM | POA: Diagnosis not present

## 2022-06-28 DIAGNOSIS — M6281 Muscle weakness (generalized): Secondary | ICD-10-CM | POA: Diagnosis not present

## 2022-06-28 DIAGNOSIS — M19122 Post-traumatic osteoarthritis, left elbow: Secondary | ICD-10-CM | POA: Diagnosis not present

## 2022-06-28 DIAGNOSIS — Z9181 History of falling: Secondary | ICD-10-CM | POA: Diagnosis not present

## 2022-06-28 DIAGNOSIS — I442 Atrioventricular block, complete: Secondary | ICD-10-CM | POA: Diagnosis not present

## 2022-06-28 DIAGNOSIS — D696 Thrombocytopenia, unspecified: Secondary | ICD-10-CM | POA: Diagnosis not present

## 2022-06-28 DIAGNOSIS — I495 Sick sinus syndrome: Secondary | ICD-10-CM | POA: Diagnosis not present

## 2022-06-28 DIAGNOSIS — Z950 Presence of cardiac pacemaker: Secondary | ICD-10-CM | POA: Diagnosis not present

## 2022-06-28 DIAGNOSIS — R2681 Unsteadiness on feet: Secondary | ICD-10-CM | POA: Diagnosis not present

## 2022-06-28 NOTE — Progress Notes (Signed)
Subjective:   Michael Colon is a 89 y.o. male who presents for Medicare Annual/Subsequent preventive examination.  Visit Complete: In person at [facility]   Patient Medicare AWV questionnaire was completed by the patient on 06/28/22; I have confirmed that all information answered by patient is correct and no changes since this date.  Review of Systems     Cardiac Risk Factors include: sedentary lifestyle;advanced age (>98men, >39 women);male gender;hypertension;dyslipidemia     Objective:    Today's Vitals   06/28/22 1410  BP: 118/73  Pulse: 98  Resp: 20  Temp: 97.7 F (36.5 C)  SpO2: 94%  Weight: 164 lb 6.4 oz (74.6 kg)  Height: 5\' 10"  (1.778 m)   Body mass index is 23.59 kg/m.     06/28/2022    2:21 PM 05/31/2022    1:14 PM 05/16/2022    9:11 AM 05/03/2022   11:24 AM 04/11/2022    9:39 AM 03/24/2022   12:10 PM 03/18/2022   10:29 AM  Advanced Directives  Does Patient Have a Medical Advance Directive? Yes Yes Yes Yes Yes Yes Yes  Type of Estate agent of [place];Out of facility DNR (pink MOST or yellow form);Living will Healthcare Power of [place];Living will;Out of facility DNR (pink MOST or yellow form) Healthcare Power of [place];Out of facility DNR (pink MOST or yellow form);Living will Healthcare Power of [place];Out of facility DNR (pink MOST or yellow form);Living will Healthcare Power of [place];Out of facility DNR (pink MOST or yellow form);Living will Healthcare Power of [place];Out of facility DNR (pink MOST or yellow form);Living will Healthcare Power of [place];Out of facility DNR (pink MOST or yellow form);Living will  Does patient want to make changes to medical advance directive? No - Patient declined No - Patient declined No - Patient declined No - Patient declined No - Patient declined No - Patient declined No - Patient declined  Copy of Healthcare Power of Attorney in Chart? Yes - validated most recent copy scanned in chart (See row  information) Yes - validated most recent copy scanned in chart (See row information) Yes - validated most recent copy scanned in chart (See row information) Yes - validated most recent copy scanned in chart (See row information) Yes - validated most recent copy scanned in chart (See row information) Yes - validated most recent copy scanned in chart (See row information) Yes - validated most recent copy scanned in chart (See row information)  Pre-existing out of facility DNR order (yellow form or pink MOST form)    Yellow form placed in chart (order not valid for inpatient use)       Current Medications (verified) Outpatient Encounter Medications as of 06/28/2022  Medication Sig   acetaminophen (TYLENOL) 500 MG tablet Take 1,000 mg by mouth every 8 (eight) hours as needed.   apixaban (ELIQUIS) 2.5 MG TABS tablet Take 2.5 mg by mouth 2 (two) times daily.   APPLE CIDER VINEGAR PO Take 1 capsule by mouth at bedtime.   atorvastatin (LIPITOR) 80 MG tablet Take 1 tablet (80 mg total) by mouth daily.   Cranberry 250 MG TABS Take 1 tablet by mouth 2 (two) times daily.   Cyanocobalamin (B-12 PO) Take 1,000 mg by mouth daily.   finasteride (PROSCAR) 5 MG tablet Take 1 tablet (5 mg total) by mouth daily.   guaiFENesin (TUSSIN PO) Take 10 mLs by mouth every 4 (four) hours as needed.   lactose free nutrition (BOOST) LIQD Take 237 mLs by mouth 3 (  three) times daily between meals.   lidocaine (LIDODERM) 5 % Place 1 patch onto the skin daily. Remove & Discard patch within 12 hours or as directed by MD   Magnesium Oxide 400 MG CAPS Take 1 capsule (400 mg total) by mouth daily.   metFORMIN (GLUCOPHAGE) 500 MG tablet TAKE 1 TABLET BY MOUTH  TWICE DAILY WITH MEALS   Polyethyl Glycol-Propyl Glycol (SYSTANE ULTRA OP) Place 1 drop into both eyes daily as needed (dry eyes).   polyethylene glycol (MIRALAX / GLYCOLAX) 17 g packet Take 17 g by mouth. Daily every Monday, Wednesday and Friday.   senna (SENOKOT) 8.6 MG TABS  tablet Take 1 tablet (8.6 mg total) by mouth daily.   sertraline (ZOLOFT) 50 MG tablet Take 50 mg by mouth daily.   silver sulfADIAZINE (SILVADENE) 1 % cream Apply 1 Application topically daily.   sodium chloride (OCEAN) 0.65 % SOLN nasal spray Place 2 sprays into both nostrils 2 (two) times daily as needed for congestion.   tamsulosin (FLOMAX) 0.4 MG CAPS capsule Take 2 capsules (0.8 mg total) by mouth at bedtime.   Wheat Dextrin (BENEFIBER) POWD Give 2 tsp by mouth in the morning for Bowel Regulation mix in 8oz liquid of resident's choice   [DISCONTINUED] mirtazapine (REMERON) 7.5 MG tablet Take 1 tablet (7.5 mg total) by mouth at bedtime.   No facility-administered encounter medications on file as of 06/28/2022.    Allergies (verified) Lovenox [enoxaparin], Proscar [finasteride], Latex, Nickel, Percocet [oxycodone-acetaminophen], and Tape   History: Past Medical History:  Diagnosis Date   Asthma    Atrial flutter (HCC) 2007   Band keratopathy    Benign prostatic hypertrophy    Chronic ear infection    Chronic kidney disease    Chronic prostatitis    Dental bridge present    permanent - upper   Diabetes mellitus    Elbow stiffness, left    s/p fracture many yrs ago.  arm does not straighten   GERD (gastroesophageal reflux disease)    laryngeal involvement   Hyperlipidemia    Hypertension    Obstructive sleep apnea    CPAP-9   Osteoarthrosis, localized, primary, knee    post-traumatic   Prostatitis    Stroke (HCC)    TIA   Tachy-brady syndrome (HCC)    UTI (urinary tract infection)    Past Surgical History:  Procedure Laterality Date   APPENDECTOMY     BELPHAROPTOSIS REPAIR     Dr [name]---didn't resolve weepy eye and eyelid drooping   CARDIOVERSION  04/13/2010   CATARACT EXTRACTION, BILATERAL  2009   Chest pain  8/12   Stress test benign   ESOPHAGEAL DILATION  05/26/2016   Procedure: ESOPHAGEAL DILATION;  Surgeon: [name], MD;  Location: [facility];   Service: Endoscopy;;   ESOPHAGOGASTRODUODENOSCOPY (EGD) WITH PROPOFOL N/A 05/26/2016   Procedure: ESOPHAGOGASTRODUODENOSCOPY (EGD) WITH PROPOFOL;  Surgeon: [name], MD;  Location: [facility];  Service: Endoscopy;  Laterality: N/A;  Diabetic - oral meds sleep apnea   EYE SURGERY     FRACTURE SURGERY Left    elbow   KNEE SURGERY  1998   plate after fracture, then removed for infection   left elbow surgery     MASTOIDECTOMY  8/08   Dr [name]   PACEMAKER IMPLANT N/A 09/02/2021   Procedure: PACEMAKER IMPLANT;  Surgeon: [name], MD;  Location: [facility];  Service: Cardiovascular;  Laterality: N/A;   RHINOPLASTY  5/10   and  septoplasty   SUBACROMIAL DECOMPRESSION Right 2005   Arthroscopic (for rotator cuff and biceps tendon ruptures)   TEAR DUCT PROBING  11/13   Dr [name]   TOTAL KNEE ARTHROPLASTY Left 09/01/2014   Procedure: TOTAL KNEE ARTHROPLASTY;  Surgeon: [name], MD;  Location: [facility];  Service: Orthopedics;  Laterality: Left;   TRIGGER FINGER RELEASE Left 02/25/2015   Procedure: LEFT LONG TRIGGER RELEASE;  Surgeon: [name], MD;  Location: [facility];  Service: Orthopedics;  Laterality: Left;   VENOUS ABLATION     Family History  Problem Relation Age of Onset   Colon cancer Father    Diabetes Father    Hypertension Father    Other Mother        natural causes   Heart attack Neg Hx    Stroke Neg Hx    Social History   Socioeconomic History   Marital status: Widowed    Spouse name: Not on file   Number of children: 2   Years of education: Not on file   Highest education level: Not on file  Occupational History   Occupation: Pastor    Comment: Retired--[employer]  Tobacco Use   Smoking status: Former    Packs/day: 1.00    Years: 0.00    Additional pack years: 0.00    Total pack years: 0.00    Types: Cigarettes    Quit date: 01/03/1969    Years since quitting: 53.5    Passive exposure: Never   Smokeless tobacco: Never   Vaping Use   Vaping Use: Never used  Substance and Sexual Activity   Alcohol use: No    Alcohol/week: 1.0 standard drink of alcohol    Types: 1 Glasses of wine per week    Comment: rare wine. 1-2x/yr.   Drug use: No   Sexual activity: Not Currently  Other Topics Concern   Not on file  Social History Narrative   Has living will   Health care POA-- son Onalee Hua   Has DNR order from the past---form redone 06/25/10   Probably no feeding tube if cognitively unaware   Social Determinants of Health   Financial Resource Strain: Not on file  Food Insecurity: Patient Declined (12/05/2021)   Hunger Vital Sign    Worried About Running Out of Food in the Last Year: Patient declined    Ran Out of Food in the Last Year: Patient declined  Transportation Needs: No Transportation Needs (12/05/2021)   PRAPARE - Administrator, Civil Service (Medical): No    Lack of Transportation (Non-Medical): No  Physical Activity: Not on file  Stress: Not on file  Social Connections: Not on file    Tobacco Counseling Counseling given: Not Answered   Clinical Intake:  Pre-visit preparation completed: Yes  Pain : No/denies pain     BMI - recorded: 23 Nutritional Status: BMI of 19-24  Normal Nutritional Risks: None Diabetes: No  How often do you need to have someone help you when you read instructions, pamphlets, or other written materials from your doctor or pharmacy?: 2 - Rarely         Activities of Daily Living    06/28/2022    1:59 PM 12/04/2021    8:06 PM  In your present state of health, do you have any difficulty performing the following activities:  Hearing? 1 0  Vision? 0 0  Difficulty concentrating or making decisions? 0 0  Walking or climbing stairs? 1 1  Dressing or  bathing? 1 1  Doing errands, shopping? 1 0  Preparing Food and eating ? N   Using the Toilet? N   In the past six months, have you accidently leaked urine? Y   Do you have problems with loss of bowel  control? Y   Managing your Medications? Y   Managing your Finances? Y   Comment son helps him   Housekeeping or managing your Housekeeping? Y     Patient Care Team: [name], MD as PCP - General (Family Medicine) [name], MD as PCP - Cardiology (Cardiology) Antonieta Iba, MD as Consulting Physician (Cardiology)  Indicate any recent Medical Services you may have received from other than [facility] providers in the past year (date may be approximate).     Assessment:   This is a routine wellness examination for Gerrick.  Hearing/Vision screen No results found.  Dietary issues and exercise activities discussed:     Goals Addressed   None    Depression Screen    02/25/2021    2:47 PM 02/21/2020   10:30 AM 02/15/2019    9:47 AM 01/02/2018    9:21 AM 06/28/2016    9:48 AM 12/23/2014   11:59 AM 12/13/2013   11:09 AM  PHQ 2/9 Scores  PHQ - 2 Score 0 0 1 0 0 0 0    Fall Risk    06/28/2022    2:03 PM 02/25/2021    2:47 PM 02/21/2020   10:30 AM 02/15/2019    9:47 AM 11/27/2018   12:28 PM  Fall Risk   Falls in the past year? 1 1 0 0 0  Comment     Emmi Telephone Survey: data to providers prior to load  Number falls in past yr: 1 0     Injury with Fall? 1 0     Risk for fall due to : History of fall(s);Impaired balance/gait;Impaired mobility;Impaired vision        MEDICARE RISK AT HOME:   TIMED UP AND GO:  Was the test performed?  No    Cognitive Function:        Immunizations Immunization History  Administered Date(s) Administered   Fluad Quad(high Dose 65+) 09/14/2018   H1N1 12/13/2007   Influenza Split 09/11/2010   Influenza Whole 09/12/2011   Influenza, High Dose Seasonal PF 09/20/2012, 09/07/2017   Influenza, Seasonal, Injecte, Preservative Fre 09/08/2007, 09/26/2008, 10/02/2014, 09/18/2015   Influenza,inj,Quad PF,6+ Mos 09/19/2013, 09/21/2015   Influenza-Unspecified 09/07/2017, 09/11/2019, 09/03/2020, 10/19/2021   Moderna Sars-Covid-2  Vaccination 01/18/2019, 02/15/2019, 11/19/2019, 10/17/2020, 06/01/2021   Pneumococcal Conjugate-13 09/19/2013   Pneumococcal Polysaccharide-23 09/26/2003, 06/28/2016   Td 08/04/1998   Tdap 06/04/2010, 08/15/2021   Tetanus 08/04/1998   Unspecified SARS-COV-2 Vaccination 06/01/2021   Zoster, Live 01/16/2004    TDAP status: Up to date  Flu Vaccine status: Up to date  Pneumococcal vaccine status: Up to date  Covid-19 vaccine status: Information provided on how to obtain vaccines.   Qualifies for Shingles Vaccine? Yes   Zostavax completed No   Shingrix Completed?: No.    Education has been provided regarding the importance of this vaccine. Patient has been advised to call insurance company to determine out of pocket expense if they have not yet received this vaccine. Advised may also receive vaccine at local pharmacy or Health Dept. Verbalized acceptance and understanding.  Screening Tests Health Maintenance  Topic Date Due   Zoster Vaccines- Shingrix (1 of 2) Never done   COVID-19 Vaccine (7 - 2023-24 season)  09/03/2021   INFLUENZA VACCINE  08/04/2022   FOOT EXAM  08/20/2022   HEMOGLOBIN A1C  11/05/2022   OPHTHALMOLOGY EXAM  06/01/2023   Medicare Annual Wellness (AWV)  06/28/2023   DTaP/Tdap/Td (4 - Td or Tdap) 08/16/2031   Pneumonia Vaccine 81+ Years old  Completed   HPV VACCINES  Aged Out    Health Maintenance  Health Maintenance Due  Topic Date Due   Zoster Vaccines- Shingrix (1 of 2) Never done   COVID-19 Vaccine (7 - 2023-24 season) 09/03/2021    Colorectal cancer screening: No longer required.   Lung Cancer Screening: (Low Dose CT Chest recommended if Age 63-80 years, 20 pack-year currently smoking OR have quit w/in 15years.) does not qualify.   Lung Cancer Screening Referral: na  Additional Screening:  Hepatitis C Screening: does not qualify  Vision Screening: Recommended annual ophthalmology exams for early detection of glaucoma and other disorders of the  eye. Is the patient up to date with their annual eye exam?  Yes  Who is the provider or what is the name of the office in which the patient attends annual eye exams? [facility] If pt is not established with a provider, would they like to be referred to a provider to establish care? No .   Dental Screening: Recommended annual dental exams for proper oral hygiene  Diabetic Foot Exam: Diabetic Foot Exam: Completed 08/19/21  Community Resource Referral / Chronic Care Management: CRR required this visit?  No   CCM required this visit?  No     Plan:     I have personally reviewed and noted the following in the patient's chart:   Medical and social history Use of alcohol, tobacco or illicit drugs  Current medications and supplements including opioid prescriptions. Patient is not currently taking opioid prescriptions. Functional ability and status Nutritional status Physical activity Advanced directives List of other physicians Hospitalizations, surgeries, and ER visits in previous 12 months Vitals Screenings to include cognitive, depression, and falls Referrals and appointments  In addition, I have reviewed and discussed with patient certain preventive protocols, quality metrics, and best practice recommendations. A written personalized care plan for preventive services as well as general preventive health recommendations were provided to patient.     Sharon Seller, NP   06/28/2022

## 2022-06-28 NOTE — Patient Instructions (Signed)
  Michael Colon , Thank you for taking time to come for your Medicare Wellness Visit. I appreciate your ongoing commitment to your health goals. Please review the following plan we discussed and let me know if I can assist you in the future.   These are the goals we discussed:  Goals   None     This is a list of the screening recommended for you and due dates:  Health Maintenance  Topic Date Due   Zoster (Shingles) Vaccine (1 of 2) Never done   COVID-19 Vaccine (7 - 2023-24 season) 09/03/2021   Flu Shot  08/04/2022   Complete foot exam   08/20/2022   Hemoglobin A1C  11/05/2022   Eye exam for diabetics  06/01/2023   Medicare Annual Wellness Visit  06/28/2023   DTaP/Tdap/Td vaccine (4 - Td or Tdap) 08/16/2031   Pneumonia Vaccine  Completed   HPV Vaccine  Aged Out

## 2022-06-29 DIAGNOSIS — S42201D Unspecified fracture of upper end of right humerus, subsequent encounter for fracture with routine healing: Secondary | ICD-10-CM | POA: Diagnosis not present

## 2022-06-29 DIAGNOSIS — R2689 Other abnormalities of gait and mobility: Secondary | ICD-10-CM | POA: Diagnosis not present

## 2022-06-29 DIAGNOSIS — I495 Sick sinus syndrome: Secondary | ICD-10-CM | POA: Diagnosis not present

## 2022-06-29 DIAGNOSIS — E1140 Type 2 diabetes mellitus with diabetic neuropathy, unspecified: Secondary | ICD-10-CM | POA: Diagnosis not present

## 2022-06-29 DIAGNOSIS — M7022 Olecranon bursitis, left elbow: Secondary | ICD-10-CM | POA: Diagnosis not present

## 2022-06-29 DIAGNOSIS — D696 Thrombocytopenia, unspecified: Secondary | ICD-10-CM | POA: Diagnosis not present

## 2022-06-29 DIAGNOSIS — Z950 Presence of cardiac pacemaker: Secondary | ICD-10-CM | POA: Diagnosis not present

## 2022-06-29 DIAGNOSIS — R278 Other lack of coordination: Secondary | ICD-10-CM | POA: Diagnosis not present

## 2022-06-29 DIAGNOSIS — S42402D Unspecified fracture of lower end of left humerus, subsequent encounter for fracture with routine healing: Secondary | ICD-10-CM | POA: Diagnosis not present

## 2022-06-29 DIAGNOSIS — R2681 Unsteadiness on feet: Secondary | ICD-10-CM | POA: Diagnosis not present

## 2022-06-29 DIAGNOSIS — I442 Atrioventricular block, complete: Secondary | ICD-10-CM | POA: Diagnosis not present

## 2022-06-29 DIAGNOSIS — M19122 Post-traumatic osteoarthritis, left elbow: Secondary | ICD-10-CM | POA: Diagnosis not present

## 2022-06-29 DIAGNOSIS — R262 Difficulty in walking, not elsewhere classified: Secondary | ICD-10-CM | POA: Diagnosis not present

## 2022-06-29 DIAGNOSIS — M6281 Muscle weakness (generalized): Secondary | ICD-10-CM | POA: Diagnosis not present

## 2022-06-29 DIAGNOSIS — Z9181 History of falling: Secondary | ICD-10-CM | POA: Diagnosis not present

## 2022-07-01 DIAGNOSIS — S42201D Unspecified fracture of upper end of right humerus, subsequent encounter for fracture with routine healing: Secondary | ICD-10-CM | POA: Diagnosis not present

## 2022-07-01 DIAGNOSIS — Z9181 History of falling: Secondary | ICD-10-CM | POA: Diagnosis not present

## 2022-07-01 DIAGNOSIS — E1140 Type 2 diabetes mellitus with diabetic neuropathy, unspecified: Secondary | ICD-10-CM | POA: Diagnosis not present

## 2022-07-01 DIAGNOSIS — M7022 Olecranon bursitis, left elbow: Secondary | ICD-10-CM | POA: Diagnosis not present

## 2022-07-01 DIAGNOSIS — R278 Other lack of coordination: Secondary | ICD-10-CM | POA: Diagnosis not present

## 2022-07-01 DIAGNOSIS — D696 Thrombocytopenia, unspecified: Secondary | ICD-10-CM | POA: Diagnosis not present

## 2022-07-01 DIAGNOSIS — M6281 Muscle weakness (generalized): Secondary | ICD-10-CM | POA: Diagnosis not present

## 2022-07-01 DIAGNOSIS — Z950 Presence of cardiac pacemaker: Secondary | ICD-10-CM | POA: Diagnosis not present

## 2022-07-01 DIAGNOSIS — I442 Atrioventricular block, complete: Secondary | ICD-10-CM | POA: Diagnosis not present

## 2022-07-01 DIAGNOSIS — R2689 Other abnormalities of gait and mobility: Secondary | ICD-10-CM | POA: Diagnosis not present

## 2022-07-01 DIAGNOSIS — R2681 Unsteadiness on feet: Secondary | ICD-10-CM | POA: Diagnosis not present

## 2022-07-01 DIAGNOSIS — I495 Sick sinus syndrome: Secondary | ICD-10-CM | POA: Diagnosis not present

## 2022-07-01 DIAGNOSIS — M19122 Post-traumatic osteoarthritis, left elbow: Secondary | ICD-10-CM | POA: Diagnosis not present

## 2022-07-01 DIAGNOSIS — R262 Difficulty in walking, not elsewhere classified: Secondary | ICD-10-CM | POA: Diagnosis not present

## 2022-07-01 DIAGNOSIS — S42402D Unspecified fracture of lower end of left humerus, subsequent encounter for fracture with routine healing: Secondary | ICD-10-CM | POA: Diagnosis not present

## 2022-07-02 DIAGNOSIS — Z950 Presence of cardiac pacemaker: Secondary | ICD-10-CM | POA: Diagnosis not present

## 2022-07-02 DIAGNOSIS — M19122 Post-traumatic osteoarthritis, left elbow: Secondary | ICD-10-CM | POA: Diagnosis not present

## 2022-07-02 DIAGNOSIS — D696 Thrombocytopenia, unspecified: Secondary | ICD-10-CM | POA: Diagnosis not present

## 2022-07-02 DIAGNOSIS — R278 Other lack of coordination: Secondary | ICD-10-CM | POA: Diagnosis not present

## 2022-07-02 DIAGNOSIS — I495 Sick sinus syndrome: Secondary | ICD-10-CM | POA: Diagnosis not present

## 2022-07-02 DIAGNOSIS — M6281 Muscle weakness (generalized): Secondary | ICD-10-CM | POA: Diagnosis not present

## 2022-07-02 DIAGNOSIS — S42402D Unspecified fracture of lower end of left humerus, subsequent encounter for fracture with routine healing: Secondary | ICD-10-CM | POA: Diagnosis not present

## 2022-07-02 DIAGNOSIS — R262 Difficulty in walking, not elsewhere classified: Secondary | ICD-10-CM | POA: Diagnosis not present

## 2022-07-02 DIAGNOSIS — I442 Atrioventricular block, complete: Secondary | ICD-10-CM | POA: Diagnosis not present

## 2022-07-02 DIAGNOSIS — S42201D Unspecified fracture of upper end of right humerus, subsequent encounter for fracture with routine healing: Secondary | ICD-10-CM | POA: Diagnosis not present

## 2022-07-02 DIAGNOSIS — E1140 Type 2 diabetes mellitus with diabetic neuropathy, unspecified: Secondary | ICD-10-CM | POA: Diagnosis not present

## 2022-07-02 DIAGNOSIS — R2681 Unsteadiness on feet: Secondary | ICD-10-CM | POA: Diagnosis not present

## 2022-07-02 DIAGNOSIS — R2689 Other abnormalities of gait and mobility: Secondary | ICD-10-CM | POA: Diagnosis not present

## 2022-07-02 DIAGNOSIS — Z9181 History of falling: Secondary | ICD-10-CM | POA: Diagnosis not present

## 2022-07-02 DIAGNOSIS — M7022 Olecranon bursitis, left elbow: Secondary | ICD-10-CM | POA: Diagnosis not present

## 2022-07-04 DIAGNOSIS — M7022 Olecranon bursitis, left elbow: Secondary | ICD-10-CM | POA: Diagnosis not present

## 2022-07-04 DIAGNOSIS — R2681 Unsteadiness on feet: Secondary | ICD-10-CM | POA: Diagnosis not present

## 2022-07-04 DIAGNOSIS — E1140 Type 2 diabetes mellitus with diabetic neuropathy, unspecified: Secondary | ICD-10-CM | POA: Diagnosis not present

## 2022-07-04 DIAGNOSIS — Z950 Presence of cardiac pacemaker: Secondary | ICD-10-CM | POA: Diagnosis not present

## 2022-07-04 DIAGNOSIS — R262 Difficulty in walking, not elsewhere classified: Secondary | ICD-10-CM | POA: Diagnosis not present

## 2022-07-04 DIAGNOSIS — M6281 Muscle weakness (generalized): Secondary | ICD-10-CM | POA: Diagnosis not present

## 2022-07-04 DIAGNOSIS — S42402D Unspecified fracture of lower end of left humerus, subsequent encounter for fracture with routine healing: Secondary | ICD-10-CM | POA: Diagnosis not present

## 2022-07-04 DIAGNOSIS — Z9181 History of falling: Secondary | ICD-10-CM | POA: Diagnosis not present

## 2022-07-04 DIAGNOSIS — I495 Sick sinus syndrome: Secondary | ICD-10-CM | POA: Diagnosis not present

## 2022-07-04 DIAGNOSIS — R2689 Other abnormalities of gait and mobility: Secondary | ICD-10-CM | POA: Diagnosis not present

## 2022-07-04 DIAGNOSIS — I442 Atrioventricular block, complete: Secondary | ICD-10-CM | POA: Diagnosis not present

## 2022-07-04 DIAGNOSIS — S42201D Unspecified fracture of upper end of right humerus, subsequent encounter for fracture with routine healing: Secondary | ICD-10-CM | POA: Diagnosis not present

## 2022-07-04 DIAGNOSIS — M19122 Post-traumatic osteoarthritis, left elbow: Secondary | ICD-10-CM | POA: Diagnosis not present

## 2022-07-04 DIAGNOSIS — R278 Other lack of coordination: Secondary | ICD-10-CM | POA: Diagnosis not present

## 2022-07-04 DIAGNOSIS — D696 Thrombocytopenia, unspecified: Secondary | ICD-10-CM | POA: Diagnosis not present

## 2022-07-05 DIAGNOSIS — I495 Sick sinus syndrome: Secondary | ICD-10-CM | POA: Diagnosis not present

## 2022-07-05 DIAGNOSIS — R262 Difficulty in walking, not elsewhere classified: Secondary | ICD-10-CM | POA: Diagnosis not present

## 2022-07-05 DIAGNOSIS — Z9181 History of falling: Secondary | ICD-10-CM | POA: Diagnosis not present

## 2022-07-05 DIAGNOSIS — R2689 Other abnormalities of gait and mobility: Secondary | ICD-10-CM | POA: Diagnosis not present

## 2022-07-05 DIAGNOSIS — I442 Atrioventricular block, complete: Secondary | ICD-10-CM | POA: Diagnosis not present

## 2022-07-05 DIAGNOSIS — M7022 Olecranon bursitis, left elbow: Secondary | ICD-10-CM | POA: Diagnosis not present

## 2022-07-05 DIAGNOSIS — R2681 Unsteadiness on feet: Secondary | ICD-10-CM | POA: Diagnosis not present

## 2022-07-05 DIAGNOSIS — Z950 Presence of cardiac pacemaker: Secondary | ICD-10-CM | POA: Diagnosis not present

## 2022-07-05 DIAGNOSIS — R278 Other lack of coordination: Secondary | ICD-10-CM | POA: Diagnosis not present

## 2022-07-05 DIAGNOSIS — D696 Thrombocytopenia, unspecified: Secondary | ICD-10-CM | POA: Diagnosis not present

## 2022-07-05 DIAGNOSIS — S42201D Unspecified fracture of upper end of right humerus, subsequent encounter for fracture with routine healing: Secondary | ICD-10-CM | POA: Diagnosis not present

## 2022-07-05 DIAGNOSIS — E1140 Type 2 diabetes mellitus with diabetic neuropathy, unspecified: Secondary | ICD-10-CM | POA: Diagnosis not present

## 2022-07-05 DIAGNOSIS — M19122 Post-traumatic osteoarthritis, left elbow: Secondary | ICD-10-CM | POA: Diagnosis not present

## 2022-07-05 DIAGNOSIS — M6281 Muscle weakness (generalized): Secondary | ICD-10-CM | POA: Diagnosis not present

## 2022-07-05 DIAGNOSIS — S42402D Unspecified fracture of lower end of left humerus, subsequent encounter for fracture with routine healing: Secondary | ICD-10-CM | POA: Diagnosis not present

## 2022-07-06 ENCOUNTER — Encounter: Payer: Self-pay | Admitting: Student

## 2022-07-06 ENCOUNTER — Non-Acute Institutional Stay (SKILLED_NURSING_FACILITY): Payer: Medicare Other | Admitting: Student

## 2022-07-06 DIAGNOSIS — Z950 Presence of cardiac pacemaker: Secondary | ICD-10-CM | POA: Diagnosis not present

## 2022-07-06 DIAGNOSIS — I495 Sick sinus syndrome: Secondary | ICD-10-CM | POA: Diagnosis not present

## 2022-07-06 DIAGNOSIS — R262 Difficulty in walking, not elsewhere classified: Secondary | ICD-10-CM | POA: Diagnosis not present

## 2022-07-06 DIAGNOSIS — R54 Age-related physical debility: Secondary | ICD-10-CM | POA: Diagnosis not present

## 2022-07-06 DIAGNOSIS — R2681 Unsteadiness on feet: Secondary | ICD-10-CM | POA: Diagnosis not present

## 2022-07-06 DIAGNOSIS — I5032 Chronic diastolic (congestive) heart failure: Secondary | ICD-10-CM | POA: Diagnosis not present

## 2022-07-06 DIAGNOSIS — I700 Atherosclerosis of aorta: Secondary | ICD-10-CM

## 2022-07-06 DIAGNOSIS — I4892 Unspecified atrial flutter: Secondary | ICD-10-CM

## 2022-07-06 DIAGNOSIS — D696 Thrombocytopenia, unspecified: Secondary | ICD-10-CM

## 2022-07-06 DIAGNOSIS — R278 Other lack of coordination: Secondary | ICD-10-CM | POA: Diagnosis not present

## 2022-07-06 DIAGNOSIS — E440 Moderate protein-calorie malnutrition: Secondary | ICD-10-CM | POA: Diagnosis not present

## 2022-07-06 DIAGNOSIS — R2689 Other abnormalities of gait and mobility: Secondary | ICD-10-CM | POA: Diagnosis not present

## 2022-07-06 DIAGNOSIS — M7022 Olecranon bursitis, left elbow: Secondary | ICD-10-CM | POA: Diagnosis not present

## 2022-07-06 DIAGNOSIS — M19122 Post-traumatic osteoarthritis, left elbow: Secondary | ICD-10-CM | POA: Diagnosis not present

## 2022-07-06 DIAGNOSIS — I482 Chronic atrial fibrillation, unspecified: Secondary | ICD-10-CM | POA: Diagnosis not present

## 2022-07-06 DIAGNOSIS — S42201D Unspecified fracture of upper end of right humerus, subsequent encounter for fracture with routine healing: Secondary | ICD-10-CM | POA: Diagnosis not present

## 2022-07-06 DIAGNOSIS — S42402D Unspecified fracture of lower end of left humerus, subsequent encounter for fracture with routine healing: Secondary | ICD-10-CM | POA: Diagnosis not present

## 2022-07-06 DIAGNOSIS — E1140 Type 2 diabetes mellitus with diabetic neuropathy, unspecified: Secondary | ICD-10-CM | POA: Diagnosis not present

## 2022-07-06 DIAGNOSIS — M6281 Muscle weakness (generalized): Secondary | ICD-10-CM | POA: Diagnosis not present

## 2022-07-06 DIAGNOSIS — Z9181 History of falling: Secondary | ICD-10-CM | POA: Diagnosis not present

## 2022-07-06 DIAGNOSIS — I442 Atrioventricular block, complete: Secondary | ICD-10-CM | POA: Diagnosis not present

## 2022-07-06 NOTE — Progress Notes (Signed)
Remote pacemaker transmission.   

## 2022-07-06 NOTE — Progress Notes (Signed)
[facility] Room Number: 501 A Place of Service:  SNF [phone]) Provider:  [name], MD  Patient Care Team: Michael Mealing, MD as PCP - General (Family Medicine) Michael Iba, MD as PCP - Cardiology (Cardiology) Michael Iba, MD as Consulting Physician (Cardiology)  Extended Emergency Contact Information Primary Emergency Contact: Michael Jumbo [facility]) Address: [address]          [place], [place] [address] [facility] Phone: [phone] Relation: Son  Code Status:  DNR Goals of care: Advanced Directive information    07/06/2022    9:11 AM  Advanced Directives  Does Patient Have a Medical Advance Directive? Yes  Type of Estate agent of [name];Out of facility DNR (pink MOST or yellow form);Living will  Does patient want to make changes to medical advance directive? No - Patient declined  Copy of Healthcare Power of Attorney in Chart? Yes - validated most recent copy scanned in chart (See row information)  Pre-existing out of facility DNR order (yellow form or pink MOST form) Yellow form placed in chart (order not valid for inpatient use)     Chief Complaint  Patient presents with   Medical Management of Chronic Issues    Routine visit. Discuss need for shingrix and additional covid boosters or post pone if patient refuses or is not a candidate.     HPI:  Pt is a 89 y.o. male seen today for medical management of chronic diseases.    He denies any concerns today. He has been working on eating better. He feels well.   He eats half as much as he used to. He is trying to have lifestyl changes. "It's not a diet, diet's never work." He has stayed at 164 lbs fairly stable for the last 3 months or so. He is doing well with physical therapy.   He is having good bowel movements and urinating well.   Denies signs of bleeding. Chest pain or shortness of breath.  Past Medical History:   Diagnosis Date   Asthma    Atrial flutter (HCC) 2007   Band keratopathy    Benign prostatic hypertrophy    Chronic ear infection    Chronic kidney disease    Chronic prostatitis    Dental bridge present    permanent - upper   Diabetes mellitus    Elbow stiffness, left    s/p fracture many yrs ago.  arm does not straighten   GERD (gastroesophageal reflux disease)    laryngeal involvement   Hyperlipidemia    Hypertension    Obstructive sleep apnea    CPAP-9   Osteoarthrosis, localized, primary, knee    post-traumatic   Prostatitis    Stroke (HCC)    TIA   Tachy-brady syndrome (HCC)    UTI (urinary tract infection)    Past Surgical History:  Procedure Laterality Date   APPENDECTOMY     BELPHAROPTOSIS REPAIR     Dr [facility]---didn't resolve weepy eye and eyelid drooping   CARDIOVERSION  04/13/2010   CATARACT EXTRACTION, BILATERAL  2009   Chest pain  8/12   Stress test benign   ESOPHAGEAL DILATION  05/26/2016   Procedure: ESOPHAGEAL DILATION;  Surgeon: Michael Minium, MD;  Location: [facility];  Service: Endoscopy;;   ESOPHAGOGASTRODUODENOSCOPY (EGD) WITH PROPOFOL N/A 05/26/2016   Procedure: ESOPHAGOGASTRODUODENOSCOPY (EGD) WITH PROPOFOL;  Surgeon: Michael Minium, MD;  Location: [facility];  Service: Endoscopy;  Laterality: N/A;  Diabetic -  oral meds sleep apnea   EYE SURGERY     FRACTURE SURGERY Left    elbow   KNEE SURGERY  1998   plate after fracture, then removed for infection   left elbow surgery     MASTOIDECTOMY  8/08   Dr [facility]   PACEMAKER IMPLANT N/A 09/02/2021   Procedure: PACEMAKER IMPLANT;  Surgeon: Michael Prude, MD;  Location: [facility] INVASIVE CV LAB;  Service: Cardiovascular;  Laterality: N/A;   RHINOPLASTY  5/10   and septoplasty   SUBACROMIAL DECOMPRESSION Right 2005   Arthroscopic (for rotator cuff and biceps tendon ruptures)   TEAR DUCT PROBING  11/13   Dr [facility]   TOTAL KNEE ARTHROPLASTY Left 09/01/2014   Procedure: TOTAL KNEE  ARTHROPLASTY;  Surgeon: Michael Heinz, MD;  Location: [facility] ORS;  Service: Orthopedics;  Laterality: Left;   TRIGGER FINGER RELEASE Left 02/25/2015   Procedure: LEFT LONG TRIGGER RELEASE;  Surgeon: Michael Heinz, MD;  Location: [facility] ORS;  Service: Orthopedics;  Laterality: Left;   VENOUS ABLATION      Allergies  Allergen Reactions   Lovenox [Enoxaparin] Hives   Proscar [Finasteride] Other (See Comments)    Upper body and arm weakness   Latex Rash    Has trouble with BANDAIDS that have been left on for more than 24 hours. Prefers paper tape.   Nickel Rash    Localized rash   Percocet [Oxycodone-Acetaminophen] Rash   Tape Rash and Other (See Comments)    Sensitivity     Outpatient Encounter Medications as of 07/06/2022  Medication Sig   acetaminophen (TYLENOL) 500 MG tablet Take 1,000 mg by mouth every 8 (eight) hours as needed.   apixaban (ELIQUIS) 2.5 MG TABS tablet Take 2.5 mg by mouth 2 (two) times daily.   APPLE CIDER VINEGAR PO Take 1 capsule by mouth at bedtime.   atorvastatin (LIPITOR) 80 MG tablet Take 1 tablet (80 mg total) by mouth daily.   Cranberry 250 MG TABS Take 1 tablet by mouth 2 (two) times daily.   Cyanocobalamin (B-12 PO) Take 1,000 mg by mouth daily.   finasteride (PROSCAR) 5 MG tablet Take 1 tablet (5 mg total) by mouth daily.   guaiFENesin (TUSSIN PO) Take 10 mLs by mouth every 4 (four) hours as needed.   lactose free nutrition (BOOST) LIQD Take 237 mLs by mouth 3 (three) times daily between meals.   lidocaine (LIDODERM) 5 % Place 1 patch onto the skin daily. Remove & Discard patch within 12 hours or as directed by MD   Magnesium Oxide 400 MG CAPS Take 1 capsule (400 mg total) by mouth daily.   metFORMIN (GLUCOPHAGE) 500 MG tablet TAKE 1 TABLET BY MOUTH  TWICE DAILY WITH MEALS   Polyethyl Glycol-Propyl Glycol (SYSTANE ULTRA OP) Place 1 drop into both eyes daily as needed (dry eyes).   polyethylene glycol (MIRALAX / GLYCOLAX) 17 g packet Take 17 g by mouth. Daily  every Monday, Wednesday and Friday.   sertraline (ZOLOFT) 50 MG tablet Take 50 mg by mouth daily.   silver sulfADIAZINE (SILVADENE) 1 % cream Apply 1 Application topically daily.   sodium chloride (OCEAN) 0.65 % SOLN nasal spray Place 2 sprays into both nostrils 2 (two) times daily as needed for congestion.   tamsulosin (FLOMAX) 0.4 MG CAPS capsule Take 2 capsules (0.8 mg total) by mouth at bedtime.   Wheat Dextrin (BENEFIBER) POWD Give 2 tsp by mouth in the morning for Bowel Regulation mix in 8oz liquid of  resident's choice   [DISCONTINUED] senna (SENOKOT) 8.6 MG TABS tablet Take 1 tablet (8.6 mg total) by mouth daily.   No facility-administered encounter medications on file as of 07/06/2022.    Review of Systems  Immunization History  Administered Date(s) Administered   Fluad Quad(high Dose 65+) 09/14/2018   H1N1 12/13/2007   Influenza Split 09/11/2010   Influenza Whole 09/12/2011   Influenza, High Dose Seasonal PF 09/20/2012, 09/07/2017   Influenza, Seasonal, Injecte, Preservative Fre 09/08/2007, 09/26/2008, 10/02/2014, 09/18/2015   Influenza,inj,Quad PF,6+ Mos 09/19/2013, 09/21/2015   Influenza-Unspecified 09/07/2017, 09/11/2019, 09/03/2020, 10/19/2021   Moderna Sars-Covid-2 Vaccination 01/18/2019, 02/15/2019, 11/19/2019, 10/17/2020, 06/01/2021   Pneumococcal Conjugate-13 09/19/2013   Pneumococcal Polysaccharide-23 09/26/2003, 06/28/2016   Td 08/04/1998   Tdap 06/04/2010, 08/15/2021   Tetanus 08/04/1998   Unspecified SARS-COV-2 Vaccination 06/01/2021   Zoster, Live 01/16/2004   Pertinent  Health Maintenance Due  Topic Date Due   INFLUENZA VACCINE  08/04/2022   FOOT EXAM  08/20/2022   HEMOGLOBIN A1C  11/05/2022   OPHTHALMOLOGY EXAM  06/01/2023      12/05/2021   10:38 PM 12/06/2021    7:05 AM 12/06/2021    7:40 PM 12/07/2021    8:45 AM 06/28/2022    2:03 PM  Fall Risk  Falls in the past year?     1  Was there an injury with Fall?     1  Fall Risk Category Calculator     3   (RETIRED) Patient Fall Risk Level Moderate fall risk High fall risk High fall risk High fall risk   Patient at Risk for Falls Due to     History of fall(s);Impaired balance/gait;Impaired mobility;Impaired vision   Functional Status Survey:    Vitals:   07/06/22 0910  BP: 118/73  Pulse: 98  Weight: 164 lb 9.6 oz (74.7 kg)  Height: 5\' 10"  (1.778 m)   Body mass index is 23.62 kg/m. Physical Exam Constitutional:      Appearance: He is normal weight.  Cardiovascular:     Rate and Rhythm: Normal rate.     Pulses: Normal pulses.  Pulmonary:     Effort: Pulmonary effort is normal.  Abdominal:     General: Abdomen is flat.     Palpations: Abdomen is soft.  Skin:    General: Skin is warm and dry.  Neurological:     Mental Status: He is alert and oriented to person, place, and time.     Labs reviewed: Recent Labs    09/02/21 0311 09/03/21 0457 09/16/21 0949 12/04/21 0118 12/04/21 0500 12/06/21 0348 02/08/22 1438 02/14/22 0000 05/12/22 0000  NA 138 137  --    < > 136 132* 134* 138 138  K 3.6 3.9  --    < > 3.6 3.7 3.9 3.5 3.9  CL 109 105  --    < > 105 103 96* 103 103  CO2 25 25  --    < > 25 22 24  31* 30*  GLUCOSE 125* 112*  --    < > 164* 166* 281*  --   --   BUN 26* 22  --    < > 25* 26* 50* 37* 26*  CREATININE 0.91 0.92  --    < > 0.79 0.79 1.20 1.0 0.7  CALCIUM 10.0 10.5*  --    < > 9.8 9.8 10.0 9.8 10.3  MG 1.5* 1.6* 1.6  --   --   --   --   --   --    < > =  values in this interval not displayed.   Recent Labs    02/08/22 1438 05/12/22 0000  AST 42* 13*  ALT 25 12  ALKPHOS 77 55  BILITOT 1.0  --   PROT 6.6  --   ALBUMIN 2.9* 3.1*   Recent Labs    12/06/21 0348 12/07/21 0623 02/08/22 1438 02/08/22 1438 02/14/22 0000 02/20/22 0000 02/24/22 0000 03/17/22 0000 03/24/22 0000 05/12/22 0000  WBC 11.0* 10.6* 11.9*   < > 5.4 6.4 6.9 8.4 9.3 6.9  NEUTROABS  --   --   --   --  3,440.00 4,736.00 4,720.00  --   --   --   HGB 10.4* 10.7* 12.5*  --  11.5*  10.7* 10.7* 9.5* 10.0* 10.6*  HCT 30.8* 30.9* 39.0  --  34* 32* 33* 29* 30* 32*  MCV 93.1 93.1 94.2  --   --   --   --   --   --   --   PLT 138* 139* 189  --  110* 143* 185 152 176 144*   < > = values in this interval not displayed.   Lab Results  Component Value Date   TSH 0.76 12/11/2012   Lab Results  Component Value Date   HGBA1C 7.1 05/05/2022   Lab Results  Component Value Date   CHOL 99 05/05/2022   HDL 48 05/05/2022   LDLCALC 37 05/05/2022   TRIG 48 05/05/2022   CHOLHDL 2 02/25/2021    Significant Diagnostic Results in last 30 days:  CUP PACEART REMOTE DEVICE CHECK  Result Date: 06/14/2022 Scheduled remote reviewed. Normal device function.  Next remote 91 days. LA, CVRS   Assessment/Plan Type 2 diabetes, controlled, with neuropathy (HCC)  Moderate protein-calorie malnutrition (HCC)  Atrial flutter, unspecified type (HCC)  Atrial fibrillation, chronic (HCC)  Chronic diastolic heart failure (HCC)  Aortic atherosclerosis (HCC)  Thrombocytopenia (HCC)  Frailty syndrome in geriatric patient Patient's weight has stabilized. Most recent A1c 7.1 at goal, continue metformin and atorvastatin. No signs of bleeding at this time, continue eliquis. Rate controlled at this time. Urinary retention resolved, continue proscar and flomax. No signs of hematuria. Mood improved with zoloft 50 mg nightly. Platelets now in normal range. Patient continues to require skilled level care and is living in rehab center for long term care.    Family/ staff Communication: Nursing  Labs/tests ordered:  None

## 2022-07-07 DIAGNOSIS — S42402D Unspecified fracture of lower end of left humerus, subsequent encounter for fracture with routine healing: Secondary | ICD-10-CM | POA: Diagnosis not present

## 2022-07-07 DIAGNOSIS — R2689 Other abnormalities of gait and mobility: Secondary | ICD-10-CM | POA: Diagnosis not present

## 2022-07-07 DIAGNOSIS — E1140 Type 2 diabetes mellitus with diabetic neuropathy, unspecified: Secondary | ICD-10-CM | POA: Diagnosis not present

## 2022-07-07 DIAGNOSIS — D696 Thrombocytopenia, unspecified: Secondary | ICD-10-CM | POA: Diagnosis not present

## 2022-07-07 DIAGNOSIS — R2681 Unsteadiness on feet: Secondary | ICD-10-CM | POA: Diagnosis not present

## 2022-07-07 DIAGNOSIS — I442 Atrioventricular block, complete: Secondary | ICD-10-CM | POA: Diagnosis not present

## 2022-07-07 DIAGNOSIS — M6281 Muscle weakness (generalized): Secondary | ICD-10-CM | POA: Diagnosis not present

## 2022-07-07 DIAGNOSIS — R278 Other lack of coordination: Secondary | ICD-10-CM | POA: Diagnosis not present

## 2022-07-07 DIAGNOSIS — R262 Difficulty in walking, not elsewhere classified: Secondary | ICD-10-CM | POA: Diagnosis not present

## 2022-07-07 DIAGNOSIS — I495 Sick sinus syndrome: Secondary | ICD-10-CM | POA: Diagnosis not present

## 2022-07-07 DIAGNOSIS — M19122 Post-traumatic osteoarthritis, left elbow: Secondary | ICD-10-CM | POA: Diagnosis not present

## 2022-07-07 DIAGNOSIS — Z950 Presence of cardiac pacemaker: Secondary | ICD-10-CM | POA: Diagnosis not present

## 2022-07-07 DIAGNOSIS — M7022 Olecranon bursitis, left elbow: Secondary | ICD-10-CM | POA: Diagnosis not present

## 2022-07-07 DIAGNOSIS — Z9181 History of falling: Secondary | ICD-10-CM | POA: Diagnosis not present

## 2022-07-07 DIAGNOSIS — S42201D Unspecified fracture of upper end of right humerus, subsequent encounter for fracture with routine healing: Secondary | ICD-10-CM | POA: Diagnosis not present

## 2022-07-08 ENCOUNTER — Encounter: Payer: Self-pay | Admitting: Student

## 2022-07-09 DIAGNOSIS — Z9181 History of falling: Secondary | ICD-10-CM | POA: Diagnosis not present

## 2022-07-09 DIAGNOSIS — R278 Other lack of coordination: Secondary | ICD-10-CM | POA: Diagnosis not present

## 2022-07-09 DIAGNOSIS — Z950 Presence of cardiac pacemaker: Secondary | ICD-10-CM | POA: Diagnosis not present

## 2022-07-09 DIAGNOSIS — R2681 Unsteadiness on feet: Secondary | ICD-10-CM | POA: Diagnosis not present

## 2022-07-09 DIAGNOSIS — D696 Thrombocytopenia, unspecified: Secondary | ICD-10-CM | POA: Diagnosis not present

## 2022-07-09 DIAGNOSIS — S42201D Unspecified fracture of upper end of right humerus, subsequent encounter for fracture with routine healing: Secondary | ICD-10-CM | POA: Diagnosis not present

## 2022-07-09 DIAGNOSIS — M19122 Post-traumatic osteoarthritis, left elbow: Secondary | ICD-10-CM | POA: Diagnosis not present

## 2022-07-09 DIAGNOSIS — M7022 Olecranon bursitis, left elbow: Secondary | ICD-10-CM | POA: Diagnosis not present

## 2022-07-09 DIAGNOSIS — E1140 Type 2 diabetes mellitus with diabetic neuropathy, unspecified: Secondary | ICD-10-CM | POA: Diagnosis not present

## 2022-07-09 DIAGNOSIS — I495 Sick sinus syndrome: Secondary | ICD-10-CM | POA: Diagnosis not present

## 2022-07-09 DIAGNOSIS — R2689 Other abnormalities of gait and mobility: Secondary | ICD-10-CM | POA: Diagnosis not present

## 2022-07-09 DIAGNOSIS — S42402D Unspecified fracture of lower end of left humerus, subsequent encounter for fracture with routine healing: Secondary | ICD-10-CM | POA: Diagnosis not present

## 2022-07-09 DIAGNOSIS — M6281 Muscle weakness (generalized): Secondary | ICD-10-CM | POA: Diagnosis not present

## 2022-07-09 DIAGNOSIS — I442 Atrioventricular block, complete: Secondary | ICD-10-CM | POA: Diagnosis not present

## 2022-07-09 DIAGNOSIS — R262 Difficulty in walking, not elsewhere classified: Secondary | ICD-10-CM | POA: Diagnosis not present

## 2022-07-11 DIAGNOSIS — S42402D Unspecified fracture of lower end of left humerus, subsequent encounter for fracture with routine healing: Secondary | ICD-10-CM | POA: Diagnosis not present

## 2022-07-11 DIAGNOSIS — M19122 Post-traumatic osteoarthritis, left elbow: Secondary | ICD-10-CM | POA: Diagnosis not present

## 2022-07-11 DIAGNOSIS — R262 Difficulty in walking, not elsewhere classified: Secondary | ICD-10-CM | POA: Diagnosis not present

## 2022-07-11 DIAGNOSIS — I442 Atrioventricular block, complete: Secondary | ICD-10-CM | POA: Diagnosis not present

## 2022-07-11 DIAGNOSIS — R2681 Unsteadiness on feet: Secondary | ICD-10-CM | POA: Diagnosis not present

## 2022-07-11 DIAGNOSIS — E1140 Type 2 diabetes mellitus with diabetic neuropathy, unspecified: Secondary | ICD-10-CM | POA: Diagnosis not present

## 2022-07-11 DIAGNOSIS — R2689 Other abnormalities of gait and mobility: Secondary | ICD-10-CM | POA: Diagnosis not present

## 2022-07-11 DIAGNOSIS — M6281 Muscle weakness (generalized): Secondary | ICD-10-CM | POA: Diagnosis not present

## 2022-07-11 DIAGNOSIS — I495 Sick sinus syndrome: Secondary | ICD-10-CM | POA: Diagnosis not present

## 2022-07-11 DIAGNOSIS — S42201D Unspecified fracture of upper end of right humerus, subsequent encounter for fracture with routine healing: Secondary | ICD-10-CM | POA: Diagnosis not present

## 2022-07-11 DIAGNOSIS — R278 Other lack of coordination: Secondary | ICD-10-CM | POA: Diagnosis not present

## 2022-07-11 DIAGNOSIS — Z950 Presence of cardiac pacemaker: Secondary | ICD-10-CM | POA: Diagnosis not present

## 2022-07-11 DIAGNOSIS — M7022 Olecranon bursitis, left elbow: Secondary | ICD-10-CM | POA: Diagnosis not present

## 2022-07-11 DIAGNOSIS — Z9181 History of falling: Secondary | ICD-10-CM | POA: Diagnosis not present

## 2022-07-11 DIAGNOSIS — D696 Thrombocytopenia, unspecified: Secondary | ICD-10-CM | POA: Diagnosis not present

## 2022-07-12 DIAGNOSIS — S42201D Unspecified fracture of upper end of right humerus, subsequent encounter for fracture with routine healing: Secondary | ICD-10-CM | POA: Diagnosis not present

## 2022-07-12 DIAGNOSIS — D696 Thrombocytopenia, unspecified: Secondary | ICD-10-CM | POA: Diagnosis not present

## 2022-07-12 DIAGNOSIS — M19122 Post-traumatic osteoarthritis, left elbow: Secondary | ICD-10-CM | POA: Diagnosis not present

## 2022-07-12 DIAGNOSIS — S42402D Unspecified fracture of lower end of left humerus, subsequent encounter for fracture with routine healing: Secondary | ICD-10-CM | POA: Diagnosis not present

## 2022-07-12 DIAGNOSIS — Z950 Presence of cardiac pacemaker: Secondary | ICD-10-CM | POA: Diagnosis not present

## 2022-07-12 DIAGNOSIS — R278 Other lack of coordination: Secondary | ICD-10-CM | POA: Diagnosis not present

## 2022-07-12 DIAGNOSIS — R2681 Unsteadiness on feet: Secondary | ICD-10-CM | POA: Diagnosis not present

## 2022-07-12 DIAGNOSIS — I442 Atrioventricular block, complete: Secondary | ICD-10-CM | POA: Diagnosis not present

## 2022-07-12 DIAGNOSIS — Z9181 History of falling: Secondary | ICD-10-CM | POA: Diagnosis not present

## 2022-07-12 DIAGNOSIS — I495 Sick sinus syndrome: Secondary | ICD-10-CM | POA: Diagnosis not present

## 2022-07-12 DIAGNOSIS — R262 Difficulty in walking, not elsewhere classified: Secondary | ICD-10-CM | POA: Diagnosis not present

## 2022-07-12 DIAGNOSIS — R2689 Other abnormalities of gait and mobility: Secondary | ICD-10-CM | POA: Diagnosis not present

## 2022-07-12 DIAGNOSIS — E1140 Type 2 diabetes mellitus with diabetic neuropathy, unspecified: Secondary | ICD-10-CM | POA: Diagnosis not present

## 2022-07-12 DIAGNOSIS — M6281 Muscle weakness (generalized): Secondary | ICD-10-CM | POA: Diagnosis not present

## 2022-07-12 DIAGNOSIS — M7022 Olecranon bursitis, left elbow: Secondary | ICD-10-CM | POA: Diagnosis not present

## 2022-07-13 DIAGNOSIS — R2681 Unsteadiness on feet: Secondary | ICD-10-CM | POA: Diagnosis not present

## 2022-07-13 DIAGNOSIS — I495 Sick sinus syndrome: Secondary | ICD-10-CM | POA: Diagnosis not present

## 2022-07-13 DIAGNOSIS — Z950 Presence of cardiac pacemaker: Secondary | ICD-10-CM | POA: Diagnosis not present

## 2022-07-13 DIAGNOSIS — E1140 Type 2 diabetes mellitus with diabetic neuropathy, unspecified: Secondary | ICD-10-CM | POA: Diagnosis not present

## 2022-07-13 DIAGNOSIS — R262 Difficulty in walking, not elsewhere classified: Secondary | ICD-10-CM | POA: Diagnosis not present

## 2022-07-13 DIAGNOSIS — M19122 Post-traumatic osteoarthritis, left elbow: Secondary | ICD-10-CM | POA: Diagnosis not present

## 2022-07-13 DIAGNOSIS — S42402D Unspecified fracture of lower end of left humerus, subsequent encounter for fracture with routine healing: Secondary | ICD-10-CM | POA: Diagnosis not present

## 2022-07-13 DIAGNOSIS — I442 Atrioventricular block, complete: Secondary | ICD-10-CM | POA: Diagnosis not present

## 2022-07-13 DIAGNOSIS — Z9181 History of falling: Secondary | ICD-10-CM | POA: Diagnosis not present

## 2022-07-13 DIAGNOSIS — D696 Thrombocytopenia, unspecified: Secondary | ICD-10-CM | POA: Diagnosis not present

## 2022-07-13 DIAGNOSIS — M6281 Muscle weakness (generalized): Secondary | ICD-10-CM | POA: Diagnosis not present

## 2022-07-13 DIAGNOSIS — S42201D Unspecified fracture of upper end of right humerus, subsequent encounter for fracture with routine healing: Secondary | ICD-10-CM | POA: Diagnosis not present

## 2022-07-13 DIAGNOSIS — M7022 Olecranon bursitis, left elbow: Secondary | ICD-10-CM | POA: Diagnosis not present

## 2022-07-13 DIAGNOSIS — R2689 Other abnormalities of gait and mobility: Secondary | ICD-10-CM | POA: Diagnosis not present

## 2022-07-13 DIAGNOSIS — R278 Other lack of coordination: Secondary | ICD-10-CM | POA: Diagnosis not present

## 2022-07-14 DIAGNOSIS — R2681 Unsteadiness on feet: Secondary | ICD-10-CM | POA: Diagnosis not present

## 2022-07-14 DIAGNOSIS — R2689 Other abnormalities of gait and mobility: Secondary | ICD-10-CM | POA: Diagnosis not present

## 2022-07-14 DIAGNOSIS — I495 Sick sinus syndrome: Secondary | ICD-10-CM | POA: Diagnosis not present

## 2022-07-14 DIAGNOSIS — Z950 Presence of cardiac pacemaker: Secondary | ICD-10-CM | POA: Diagnosis not present

## 2022-07-14 DIAGNOSIS — I442 Atrioventricular block, complete: Secondary | ICD-10-CM | POA: Diagnosis not present

## 2022-07-14 DIAGNOSIS — E1140 Type 2 diabetes mellitus with diabetic neuropathy, unspecified: Secondary | ICD-10-CM | POA: Diagnosis not present

## 2022-07-14 DIAGNOSIS — S42402D Unspecified fracture of lower end of left humerus, subsequent encounter for fracture with routine healing: Secondary | ICD-10-CM | POA: Diagnosis not present

## 2022-07-14 DIAGNOSIS — R262 Difficulty in walking, not elsewhere classified: Secondary | ICD-10-CM | POA: Diagnosis not present

## 2022-07-14 DIAGNOSIS — D696 Thrombocytopenia, unspecified: Secondary | ICD-10-CM | POA: Diagnosis not present

## 2022-07-14 DIAGNOSIS — Z9181 History of falling: Secondary | ICD-10-CM | POA: Diagnosis not present

## 2022-07-14 DIAGNOSIS — R278 Other lack of coordination: Secondary | ICD-10-CM | POA: Diagnosis not present

## 2022-07-14 DIAGNOSIS — M7022 Olecranon bursitis, left elbow: Secondary | ICD-10-CM | POA: Diagnosis not present

## 2022-07-14 DIAGNOSIS — S42201D Unspecified fracture of upper end of right humerus, subsequent encounter for fracture with routine healing: Secondary | ICD-10-CM | POA: Diagnosis not present

## 2022-07-14 DIAGNOSIS — M19122 Post-traumatic osteoarthritis, left elbow: Secondary | ICD-10-CM | POA: Diagnosis not present

## 2022-07-14 DIAGNOSIS — M6281 Muscle weakness (generalized): Secondary | ICD-10-CM | POA: Diagnosis not present

## 2022-07-15 DIAGNOSIS — R2681 Unsteadiness on feet: Secondary | ICD-10-CM | POA: Diagnosis not present

## 2022-07-15 DIAGNOSIS — R2689 Other abnormalities of gait and mobility: Secondary | ICD-10-CM | POA: Diagnosis not present

## 2022-07-15 DIAGNOSIS — I495 Sick sinus syndrome: Secondary | ICD-10-CM | POA: Diagnosis not present

## 2022-07-15 DIAGNOSIS — Z950 Presence of cardiac pacemaker: Secondary | ICD-10-CM | POA: Diagnosis not present

## 2022-07-15 DIAGNOSIS — S42402D Unspecified fracture of lower end of left humerus, subsequent encounter for fracture with routine healing: Secondary | ICD-10-CM | POA: Diagnosis not present

## 2022-07-15 DIAGNOSIS — M19122 Post-traumatic osteoarthritis, left elbow: Secondary | ICD-10-CM | POA: Diagnosis not present

## 2022-07-15 DIAGNOSIS — M7022 Olecranon bursitis, left elbow: Secondary | ICD-10-CM | POA: Diagnosis not present

## 2022-07-15 DIAGNOSIS — M6281 Muscle weakness (generalized): Secondary | ICD-10-CM | POA: Diagnosis not present

## 2022-07-15 DIAGNOSIS — D696 Thrombocytopenia, unspecified: Secondary | ICD-10-CM | POA: Diagnosis not present

## 2022-07-15 DIAGNOSIS — R278 Other lack of coordination: Secondary | ICD-10-CM | POA: Diagnosis not present

## 2022-07-15 DIAGNOSIS — R262 Difficulty in walking, not elsewhere classified: Secondary | ICD-10-CM | POA: Diagnosis not present

## 2022-07-15 DIAGNOSIS — S42201D Unspecified fracture of upper end of right humerus, subsequent encounter for fracture with routine healing: Secondary | ICD-10-CM | POA: Diagnosis not present

## 2022-07-15 DIAGNOSIS — I442 Atrioventricular block, complete: Secondary | ICD-10-CM | POA: Diagnosis not present

## 2022-07-15 DIAGNOSIS — Z9181 History of falling: Secondary | ICD-10-CM | POA: Diagnosis not present

## 2022-07-15 DIAGNOSIS — E1140 Type 2 diabetes mellitus with diabetic neuropathy, unspecified: Secondary | ICD-10-CM | POA: Diagnosis not present

## 2022-07-16 DIAGNOSIS — S42402D Unspecified fracture of lower end of left humerus, subsequent encounter for fracture with routine healing: Secondary | ICD-10-CM | POA: Diagnosis not present

## 2022-07-16 DIAGNOSIS — M7022 Olecranon bursitis, left elbow: Secondary | ICD-10-CM | POA: Diagnosis not present

## 2022-07-16 DIAGNOSIS — M6281 Muscle weakness (generalized): Secondary | ICD-10-CM | POA: Diagnosis not present

## 2022-07-16 DIAGNOSIS — I495 Sick sinus syndrome: Secondary | ICD-10-CM | POA: Diagnosis not present

## 2022-07-16 DIAGNOSIS — Z950 Presence of cardiac pacemaker: Secondary | ICD-10-CM | POA: Diagnosis not present

## 2022-07-16 DIAGNOSIS — R262 Difficulty in walking, not elsewhere classified: Secondary | ICD-10-CM | POA: Diagnosis not present

## 2022-07-16 DIAGNOSIS — R2681 Unsteadiness on feet: Secondary | ICD-10-CM | POA: Diagnosis not present

## 2022-07-16 DIAGNOSIS — S42201D Unspecified fracture of upper end of right humerus, subsequent encounter for fracture with routine healing: Secondary | ICD-10-CM | POA: Diagnosis not present

## 2022-07-16 DIAGNOSIS — D696 Thrombocytopenia, unspecified: Secondary | ICD-10-CM | POA: Diagnosis not present

## 2022-07-16 DIAGNOSIS — R2689 Other abnormalities of gait and mobility: Secondary | ICD-10-CM | POA: Diagnosis not present

## 2022-07-16 DIAGNOSIS — Z9181 History of falling: Secondary | ICD-10-CM | POA: Diagnosis not present

## 2022-07-16 DIAGNOSIS — R278 Other lack of coordination: Secondary | ICD-10-CM | POA: Diagnosis not present

## 2022-07-16 DIAGNOSIS — E1140 Type 2 diabetes mellitus with diabetic neuropathy, unspecified: Secondary | ICD-10-CM | POA: Diagnosis not present

## 2022-07-16 DIAGNOSIS — M19122 Post-traumatic osteoarthritis, left elbow: Secondary | ICD-10-CM | POA: Diagnosis not present

## 2022-07-16 DIAGNOSIS — I442 Atrioventricular block, complete: Secondary | ICD-10-CM | POA: Diagnosis not present

## 2022-07-18 DIAGNOSIS — R2681 Unsteadiness on feet: Secondary | ICD-10-CM | POA: Diagnosis not present

## 2022-07-18 DIAGNOSIS — Z9181 History of falling: Secondary | ICD-10-CM | POA: Diagnosis not present

## 2022-07-18 DIAGNOSIS — I442 Atrioventricular block, complete: Secondary | ICD-10-CM | POA: Diagnosis not present

## 2022-07-18 DIAGNOSIS — D696 Thrombocytopenia, unspecified: Secondary | ICD-10-CM | POA: Diagnosis not present

## 2022-07-18 DIAGNOSIS — E1140 Type 2 diabetes mellitus with diabetic neuropathy, unspecified: Secondary | ICD-10-CM | POA: Diagnosis not present

## 2022-07-18 DIAGNOSIS — I495 Sick sinus syndrome: Secondary | ICD-10-CM | POA: Diagnosis not present

## 2022-07-18 DIAGNOSIS — M19122 Post-traumatic osteoarthritis, left elbow: Secondary | ICD-10-CM | POA: Diagnosis not present

## 2022-07-18 DIAGNOSIS — M7022 Olecranon bursitis, left elbow: Secondary | ICD-10-CM | POA: Diagnosis not present

## 2022-07-18 DIAGNOSIS — R278 Other lack of coordination: Secondary | ICD-10-CM | POA: Diagnosis not present

## 2022-07-18 DIAGNOSIS — R2689 Other abnormalities of gait and mobility: Secondary | ICD-10-CM | POA: Diagnosis not present

## 2022-07-18 DIAGNOSIS — S42402D Unspecified fracture of lower end of left humerus, subsequent encounter for fracture with routine healing: Secondary | ICD-10-CM | POA: Diagnosis not present

## 2022-07-18 DIAGNOSIS — M6281 Muscle weakness (generalized): Secondary | ICD-10-CM | POA: Diagnosis not present

## 2022-07-18 DIAGNOSIS — S42201D Unspecified fracture of upper end of right humerus, subsequent encounter for fracture with routine healing: Secondary | ICD-10-CM | POA: Diagnosis not present

## 2022-07-18 DIAGNOSIS — R262 Difficulty in walking, not elsewhere classified: Secondary | ICD-10-CM | POA: Diagnosis not present

## 2022-07-18 DIAGNOSIS — Z950 Presence of cardiac pacemaker: Secondary | ICD-10-CM | POA: Diagnosis not present

## 2022-07-19 DIAGNOSIS — R2681 Unsteadiness on feet: Secondary | ICD-10-CM | POA: Diagnosis not present

## 2022-07-19 DIAGNOSIS — M19122 Post-traumatic osteoarthritis, left elbow: Secondary | ICD-10-CM | POA: Diagnosis not present

## 2022-07-19 DIAGNOSIS — Z950 Presence of cardiac pacemaker: Secondary | ICD-10-CM | POA: Diagnosis not present

## 2022-07-19 DIAGNOSIS — R278 Other lack of coordination: Secondary | ICD-10-CM | POA: Diagnosis not present

## 2022-07-19 DIAGNOSIS — S42402D Unspecified fracture of lower end of left humerus, subsequent encounter for fracture with routine healing: Secondary | ICD-10-CM | POA: Diagnosis not present

## 2022-07-19 DIAGNOSIS — M6281 Muscle weakness (generalized): Secondary | ICD-10-CM | POA: Diagnosis not present

## 2022-07-19 DIAGNOSIS — Z9181 History of falling: Secondary | ICD-10-CM | POA: Diagnosis not present

## 2022-07-19 DIAGNOSIS — M7022 Olecranon bursitis, left elbow: Secondary | ICD-10-CM | POA: Diagnosis not present

## 2022-07-19 DIAGNOSIS — R2689 Other abnormalities of gait and mobility: Secondary | ICD-10-CM | POA: Diagnosis not present

## 2022-07-19 DIAGNOSIS — S42201D Unspecified fracture of upper end of right humerus, subsequent encounter for fracture with routine healing: Secondary | ICD-10-CM | POA: Diagnosis not present

## 2022-07-19 DIAGNOSIS — I442 Atrioventricular block, complete: Secondary | ICD-10-CM | POA: Diagnosis not present

## 2022-07-19 DIAGNOSIS — D696 Thrombocytopenia, unspecified: Secondary | ICD-10-CM | POA: Diagnosis not present

## 2022-07-19 DIAGNOSIS — E1140 Type 2 diabetes mellitus with diabetic neuropathy, unspecified: Secondary | ICD-10-CM | POA: Diagnosis not present

## 2022-07-19 DIAGNOSIS — R262 Difficulty in walking, not elsewhere classified: Secondary | ICD-10-CM | POA: Diagnosis not present

## 2022-07-19 DIAGNOSIS — I495 Sick sinus syndrome: Secondary | ICD-10-CM | POA: Diagnosis not present

## 2022-07-20 DIAGNOSIS — R262 Difficulty in walking, not elsewhere classified: Secondary | ICD-10-CM | POA: Diagnosis not present

## 2022-07-20 DIAGNOSIS — I495 Sick sinus syndrome: Secondary | ICD-10-CM | POA: Diagnosis not present

## 2022-07-20 DIAGNOSIS — M19122 Post-traumatic osteoarthritis, left elbow: Secondary | ICD-10-CM | POA: Diagnosis not present

## 2022-07-20 DIAGNOSIS — R2689 Other abnormalities of gait and mobility: Secondary | ICD-10-CM | POA: Diagnosis not present

## 2022-07-20 DIAGNOSIS — R278 Other lack of coordination: Secondary | ICD-10-CM | POA: Diagnosis not present

## 2022-07-20 DIAGNOSIS — Z9181 History of falling: Secondary | ICD-10-CM | POA: Diagnosis not present

## 2022-07-20 DIAGNOSIS — R2681 Unsteadiness on feet: Secondary | ICD-10-CM | POA: Diagnosis not present

## 2022-07-20 DIAGNOSIS — I442 Atrioventricular block, complete: Secondary | ICD-10-CM | POA: Diagnosis not present

## 2022-07-20 DIAGNOSIS — M7022 Olecranon bursitis, left elbow: Secondary | ICD-10-CM | POA: Diagnosis not present

## 2022-07-20 DIAGNOSIS — Z950 Presence of cardiac pacemaker: Secondary | ICD-10-CM | POA: Diagnosis not present

## 2022-07-20 DIAGNOSIS — D696 Thrombocytopenia, unspecified: Secondary | ICD-10-CM | POA: Diagnosis not present

## 2022-07-20 DIAGNOSIS — S42201D Unspecified fracture of upper end of right humerus, subsequent encounter for fracture with routine healing: Secondary | ICD-10-CM | POA: Diagnosis not present

## 2022-07-20 DIAGNOSIS — S42402D Unspecified fracture of lower end of left humerus, subsequent encounter for fracture with routine healing: Secondary | ICD-10-CM | POA: Diagnosis not present

## 2022-07-20 DIAGNOSIS — E1140 Type 2 diabetes mellitus with diabetic neuropathy, unspecified: Secondary | ICD-10-CM | POA: Diagnosis not present

## 2022-07-20 DIAGNOSIS — M6281 Muscle weakness (generalized): Secondary | ICD-10-CM | POA: Diagnosis not present

## 2022-07-21 DIAGNOSIS — M25461 Effusion, right knee: Secondary | ICD-10-CM | POA: Diagnosis not present

## 2022-07-21 DIAGNOSIS — M1711 Unilateral primary osteoarthritis, right knee: Secondary | ICD-10-CM | POA: Diagnosis not present

## 2022-07-21 DIAGNOSIS — G8929 Other chronic pain: Secondary | ICD-10-CM | POA: Diagnosis not present

## 2022-07-25 DIAGNOSIS — R2689 Other abnormalities of gait and mobility: Secondary | ICD-10-CM | POA: Diagnosis not present

## 2022-07-25 DIAGNOSIS — R278 Other lack of coordination: Secondary | ICD-10-CM | POA: Diagnosis not present

## 2022-07-25 DIAGNOSIS — R2681 Unsteadiness on feet: Secondary | ICD-10-CM | POA: Diagnosis not present

## 2022-07-25 DIAGNOSIS — I495 Sick sinus syndrome: Secondary | ICD-10-CM | POA: Diagnosis not present

## 2022-07-25 DIAGNOSIS — Z9181 History of falling: Secondary | ICD-10-CM | POA: Diagnosis not present

## 2022-07-25 DIAGNOSIS — S42402D Unspecified fracture of lower end of left humerus, subsequent encounter for fracture with routine healing: Secondary | ICD-10-CM | POA: Diagnosis not present

## 2022-07-25 DIAGNOSIS — D696 Thrombocytopenia, unspecified: Secondary | ICD-10-CM | POA: Diagnosis not present

## 2022-07-25 DIAGNOSIS — I442 Atrioventricular block, complete: Secondary | ICD-10-CM | POA: Diagnosis not present

## 2022-07-25 DIAGNOSIS — M7022 Olecranon bursitis, left elbow: Secondary | ICD-10-CM | POA: Diagnosis not present

## 2022-07-25 DIAGNOSIS — E1140 Type 2 diabetes mellitus with diabetic neuropathy, unspecified: Secondary | ICD-10-CM | POA: Diagnosis not present

## 2022-07-25 DIAGNOSIS — R262 Difficulty in walking, not elsewhere classified: Secondary | ICD-10-CM | POA: Diagnosis not present

## 2022-07-25 DIAGNOSIS — Z950 Presence of cardiac pacemaker: Secondary | ICD-10-CM | POA: Diagnosis not present

## 2022-07-25 DIAGNOSIS — S42201D Unspecified fracture of upper end of right humerus, subsequent encounter for fracture with routine healing: Secondary | ICD-10-CM | POA: Diagnosis not present

## 2022-07-25 DIAGNOSIS — M19122 Post-traumatic osteoarthritis, left elbow: Secondary | ICD-10-CM | POA: Diagnosis not present

## 2022-07-25 DIAGNOSIS — M6281 Muscle weakness (generalized): Secondary | ICD-10-CM | POA: Diagnosis not present

## 2022-07-26 DIAGNOSIS — R2681 Unsteadiness on feet: Secondary | ICD-10-CM | POA: Diagnosis not present

## 2022-07-26 DIAGNOSIS — M6281 Muscle weakness (generalized): Secondary | ICD-10-CM | POA: Diagnosis not present

## 2022-07-26 DIAGNOSIS — I442 Atrioventricular block, complete: Secondary | ICD-10-CM | POA: Diagnosis not present

## 2022-07-26 DIAGNOSIS — Z950 Presence of cardiac pacemaker: Secondary | ICD-10-CM | POA: Diagnosis not present

## 2022-07-26 DIAGNOSIS — R262 Difficulty in walking, not elsewhere classified: Secondary | ICD-10-CM | POA: Diagnosis not present

## 2022-07-26 DIAGNOSIS — M7022 Olecranon bursitis, left elbow: Secondary | ICD-10-CM | POA: Diagnosis not present

## 2022-07-26 DIAGNOSIS — M19122 Post-traumatic osteoarthritis, left elbow: Secondary | ICD-10-CM | POA: Diagnosis not present

## 2022-07-26 DIAGNOSIS — S42201D Unspecified fracture of upper end of right humerus, subsequent encounter for fracture with routine healing: Secondary | ICD-10-CM | POA: Diagnosis not present

## 2022-07-26 DIAGNOSIS — S42402D Unspecified fracture of lower end of left humerus, subsequent encounter for fracture with routine healing: Secondary | ICD-10-CM | POA: Diagnosis not present

## 2022-07-26 DIAGNOSIS — D696 Thrombocytopenia, unspecified: Secondary | ICD-10-CM | POA: Diagnosis not present

## 2022-07-26 DIAGNOSIS — Z9181 History of falling: Secondary | ICD-10-CM | POA: Diagnosis not present

## 2022-07-26 DIAGNOSIS — I495 Sick sinus syndrome: Secondary | ICD-10-CM | POA: Diagnosis not present

## 2022-07-26 DIAGNOSIS — R278 Other lack of coordination: Secondary | ICD-10-CM | POA: Diagnosis not present

## 2022-07-26 DIAGNOSIS — E1140 Type 2 diabetes mellitus with diabetic neuropathy, unspecified: Secondary | ICD-10-CM | POA: Diagnosis not present

## 2022-07-26 DIAGNOSIS — R2689 Other abnormalities of gait and mobility: Secondary | ICD-10-CM | POA: Diagnosis not present

## 2022-07-27 DIAGNOSIS — R262 Difficulty in walking, not elsewhere classified: Secondary | ICD-10-CM | POA: Diagnosis not present

## 2022-07-27 DIAGNOSIS — M6281 Muscle weakness (generalized): Secondary | ICD-10-CM | POA: Diagnosis not present

## 2022-07-27 DIAGNOSIS — D696 Thrombocytopenia, unspecified: Secondary | ICD-10-CM | POA: Diagnosis not present

## 2022-07-27 DIAGNOSIS — R278 Other lack of coordination: Secondary | ICD-10-CM | POA: Diagnosis not present

## 2022-07-27 DIAGNOSIS — E1140 Type 2 diabetes mellitus with diabetic neuropathy, unspecified: Secondary | ICD-10-CM | POA: Diagnosis not present

## 2022-07-27 DIAGNOSIS — S42402D Unspecified fracture of lower end of left humerus, subsequent encounter for fracture with routine healing: Secondary | ICD-10-CM | POA: Diagnosis not present

## 2022-07-27 DIAGNOSIS — I495 Sick sinus syndrome: Secondary | ICD-10-CM | POA: Diagnosis not present

## 2022-07-27 DIAGNOSIS — R2681 Unsteadiness on feet: Secondary | ICD-10-CM | POA: Diagnosis not present

## 2022-07-27 DIAGNOSIS — Z950 Presence of cardiac pacemaker: Secondary | ICD-10-CM | POA: Diagnosis not present

## 2022-07-27 DIAGNOSIS — R2689 Other abnormalities of gait and mobility: Secondary | ICD-10-CM | POA: Diagnosis not present

## 2022-07-27 DIAGNOSIS — I442 Atrioventricular block, complete: Secondary | ICD-10-CM | POA: Diagnosis not present

## 2022-07-27 DIAGNOSIS — M19122 Post-traumatic osteoarthritis, left elbow: Secondary | ICD-10-CM | POA: Diagnosis not present

## 2022-07-27 DIAGNOSIS — M7022 Olecranon bursitis, left elbow: Secondary | ICD-10-CM | POA: Diagnosis not present

## 2022-07-27 DIAGNOSIS — Z9181 History of falling: Secondary | ICD-10-CM | POA: Diagnosis not present

## 2022-07-27 DIAGNOSIS — S42201D Unspecified fracture of upper end of right humerus, subsequent encounter for fracture with routine healing: Secondary | ICD-10-CM | POA: Diagnosis not present

## 2022-07-28 DIAGNOSIS — D696 Thrombocytopenia, unspecified: Secondary | ICD-10-CM | POA: Diagnosis not present

## 2022-07-28 DIAGNOSIS — I495 Sick sinus syndrome: Secondary | ICD-10-CM | POA: Diagnosis not present

## 2022-07-28 DIAGNOSIS — E1140 Type 2 diabetes mellitus with diabetic neuropathy, unspecified: Secondary | ICD-10-CM | POA: Diagnosis not present

## 2022-07-28 DIAGNOSIS — Z9181 History of falling: Secondary | ICD-10-CM | POA: Diagnosis not present

## 2022-07-28 DIAGNOSIS — R278 Other lack of coordination: Secondary | ICD-10-CM | POA: Diagnosis not present

## 2022-07-28 DIAGNOSIS — R2689 Other abnormalities of gait and mobility: Secondary | ICD-10-CM | POA: Diagnosis not present

## 2022-07-28 DIAGNOSIS — M7022 Olecranon bursitis, left elbow: Secondary | ICD-10-CM | POA: Diagnosis not present

## 2022-07-28 DIAGNOSIS — S42201D Unspecified fracture of upper end of right humerus, subsequent encounter for fracture with routine healing: Secondary | ICD-10-CM | POA: Diagnosis not present

## 2022-07-28 DIAGNOSIS — S42402D Unspecified fracture of lower end of left humerus, subsequent encounter for fracture with routine healing: Secondary | ICD-10-CM | POA: Diagnosis not present

## 2022-07-28 DIAGNOSIS — M19122 Post-traumatic osteoarthritis, left elbow: Secondary | ICD-10-CM | POA: Diagnosis not present

## 2022-07-28 DIAGNOSIS — R2681 Unsteadiness on feet: Secondary | ICD-10-CM | POA: Diagnosis not present

## 2022-07-28 DIAGNOSIS — R262 Difficulty in walking, not elsewhere classified: Secondary | ICD-10-CM | POA: Diagnosis not present

## 2022-07-28 DIAGNOSIS — M6281 Muscle weakness (generalized): Secondary | ICD-10-CM | POA: Diagnosis not present

## 2022-07-28 DIAGNOSIS — I442 Atrioventricular block, complete: Secondary | ICD-10-CM | POA: Diagnosis not present

## 2022-07-28 DIAGNOSIS — Z950 Presence of cardiac pacemaker: Secondary | ICD-10-CM | POA: Diagnosis not present

## 2022-07-29 DIAGNOSIS — E1140 Type 2 diabetes mellitus with diabetic neuropathy, unspecified: Secondary | ICD-10-CM | POA: Diagnosis not present

## 2022-07-29 DIAGNOSIS — Z9181 History of falling: Secondary | ICD-10-CM | POA: Diagnosis not present

## 2022-07-29 DIAGNOSIS — R262 Difficulty in walking, not elsewhere classified: Secondary | ICD-10-CM | POA: Diagnosis not present

## 2022-07-29 DIAGNOSIS — D696 Thrombocytopenia, unspecified: Secondary | ICD-10-CM | POA: Diagnosis not present

## 2022-07-29 DIAGNOSIS — M19122 Post-traumatic osteoarthritis, left elbow: Secondary | ICD-10-CM | POA: Diagnosis not present

## 2022-07-29 DIAGNOSIS — Z950 Presence of cardiac pacemaker: Secondary | ICD-10-CM | POA: Diagnosis not present

## 2022-07-29 DIAGNOSIS — R278 Other lack of coordination: Secondary | ICD-10-CM | POA: Diagnosis not present

## 2022-07-29 DIAGNOSIS — R2689 Other abnormalities of gait and mobility: Secondary | ICD-10-CM | POA: Diagnosis not present

## 2022-07-29 DIAGNOSIS — S42402D Unspecified fracture of lower end of left humerus, subsequent encounter for fracture with routine healing: Secondary | ICD-10-CM | POA: Diagnosis not present

## 2022-07-29 DIAGNOSIS — R2681 Unsteadiness on feet: Secondary | ICD-10-CM | POA: Diagnosis not present

## 2022-07-29 DIAGNOSIS — S42201D Unspecified fracture of upper end of right humerus, subsequent encounter for fracture with routine healing: Secondary | ICD-10-CM | POA: Diagnosis not present

## 2022-07-29 DIAGNOSIS — M7022 Olecranon bursitis, left elbow: Secondary | ICD-10-CM | POA: Diagnosis not present

## 2022-07-29 DIAGNOSIS — I495 Sick sinus syndrome: Secondary | ICD-10-CM | POA: Diagnosis not present

## 2022-07-29 DIAGNOSIS — I442 Atrioventricular block, complete: Secondary | ICD-10-CM | POA: Diagnosis not present

## 2022-07-29 DIAGNOSIS — M6281 Muscle weakness (generalized): Secondary | ICD-10-CM | POA: Diagnosis not present

## 2022-08-02 DIAGNOSIS — I442 Atrioventricular block, complete: Secondary | ICD-10-CM | POA: Diagnosis not present

## 2022-08-02 DIAGNOSIS — R2681 Unsteadiness on feet: Secondary | ICD-10-CM | POA: Diagnosis not present

## 2022-08-02 DIAGNOSIS — R2689 Other abnormalities of gait and mobility: Secondary | ICD-10-CM | POA: Diagnosis not present

## 2022-08-02 DIAGNOSIS — R262 Difficulty in walking, not elsewhere classified: Secondary | ICD-10-CM | POA: Diagnosis not present

## 2022-08-02 DIAGNOSIS — M7022 Olecranon bursitis, left elbow: Secondary | ICD-10-CM | POA: Diagnosis not present

## 2022-08-02 DIAGNOSIS — Z950 Presence of cardiac pacemaker: Secondary | ICD-10-CM | POA: Diagnosis not present

## 2022-08-02 DIAGNOSIS — D696 Thrombocytopenia, unspecified: Secondary | ICD-10-CM | POA: Diagnosis not present

## 2022-08-02 DIAGNOSIS — S42402D Unspecified fracture of lower end of left humerus, subsequent encounter for fracture with routine healing: Secondary | ICD-10-CM | POA: Diagnosis not present

## 2022-08-02 DIAGNOSIS — M6281 Muscle weakness (generalized): Secondary | ICD-10-CM | POA: Diagnosis not present

## 2022-08-02 DIAGNOSIS — S42201D Unspecified fracture of upper end of right humerus, subsequent encounter for fracture with routine healing: Secondary | ICD-10-CM | POA: Diagnosis not present

## 2022-08-02 DIAGNOSIS — R278 Other lack of coordination: Secondary | ICD-10-CM | POA: Diagnosis not present

## 2022-08-02 DIAGNOSIS — E1140 Type 2 diabetes mellitus with diabetic neuropathy, unspecified: Secondary | ICD-10-CM | POA: Diagnosis not present

## 2022-08-02 DIAGNOSIS — I495 Sick sinus syndrome: Secondary | ICD-10-CM | POA: Diagnosis not present

## 2022-08-02 DIAGNOSIS — M19122 Post-traumatic osteoarthritis, left elbow: Secondary | ICD-10-CM | POA: Diagnosis not present

## 2022-08-02 DIAGNOSIS — Z9181 History of falling: Secondary | ICD-10-CM | POA: Diagnosis not present

## 2022-08-03 DIAGNOSIS — I495 Sick sinus syndrome: Secondary | ICD-10-CM | POA: Diagnosis not present

## 2022-08-03 DIAGNOSIS — R2681 Unsteadiness on feet: Secondary | ICD-10-CM | POA: Diagnosis not present

## 2022-08-03 DIAGNOSIS — R278 Other lack of coordination: Secondary | ICD-10-CM | POA: Diagnosis not present

## 2022-08-03 DIAGNOSIS — M7022 Olecranon bursitis, left elbow: Secondary | ICD-10-CM | POA: Diagnosis not present

## 2022-08-03 DIAGNOSIS — M19122 Post-traumatic osteoarthritis, left elbow: Secondary | ICD-10-CM | POA: Diagnosis not present

## 2022-08-03 DIAGNOSIS — R2689 Other abnormalities of gait and mobility: Secondary | ICD-10-CM | POA: Diagnosis not present

## 2022-08-03 DIAGNOSIS — D696 Thrombocytopenia, unspecified: Secondary | ICD-10-CM | POA: Diagnosis not present

## 2022-08-03 DIAGNOSIS — Z950 Presence of cardiac pacemaker: Secondary | ICD-10-CM | POA: Diagnosis not present

## 2022-08-03 DIAGNOSIS — Z9181 History of falling: Secondary | ICD-10-CM | POA: Diagnosis not present

## 2022-08-03 DIAGNOSIS — M6281 Muscle weakness (generalized): Secondary | ICD-10-CM | POA: Diagnosis not present

## 2022-08-03 DIAGNOSIS — R262 Difficulty in walking, not elsewhere classified: Secondary | ICD-10-CM | POA: Diagnosis not present

## 2022-08-03 DIAGNOSIS — E1140 Type 2 diabetes mellitus with diabetic neuropathy, unspecified: Secondary | ICD-10-CM | POA: Diagnosis not present

## 2022-08-03 DIAGNOSIS — S42201D Unspecified fracture of upper end of right humerus, subsequent encounter for fracture with routine healing: Secondary | ICD-10-CM | POA: Diagnosis not present

## 2022-08-03 DIAGNOSIS — I442 Atrioventricular block, complete: Secondary | ICD-10-CM | POA: Diagnosis not present

## 2022-08-03 DIAGNOSIS — S42402D Unspecified fracture of lower end of left humerus, subsequent encounter for fracture with routine healing: Secondary | ICD-10-CM | POA: Diagnosis not present

## 2022-08-04 DIAGNOSIS — M6281 Muscle weakness (generalized): Secondary | ICD-10-CM | POA: Diagnosis not present

## 2022-08-04 DIAGNOSIS — E1140 Type 2 diabetes mellitus with diabetic neuropathy, unspecified: Secondary | ICD-10-CM | POA: Diagnosis not present

## 2022-08-04 DIAGNOSIS — Z950 Presence of cardiac pacemaker: Secondary | ICD-10-CM | POA: Diagnosis not present

## 2022-08-04 DIAGNOSIS — M19122 Post-traumatic osteoarthritis, left elbow: Secondary | ICD-10-CM | POA: Diagnosis not present

## 2022-08-04 DIAGNOSIS — R2689 Other abnormalities of gait and mobility: Secondary | ICD-10-CM | POA: Diagnosis not present

## 2022-08-04 DIAGNOSIS — R2681 Unsteadiness on feet: Secondary | ICD-10-CM | POA: Diagnosis not present

## 2022-08-04 DIAGNOSIS — I495 Sick sinus syndrome: Secondary | ICD-10-CM | POA: Diagnosis not present

## 2022-08-04 DIAGNOSIS — R262 Difficulty in walking, not elsewhere classified: Secondary | ICD-10-CM | POA: Diagnosis not present

## 2022-08-04 DIAGNOSIS — I442 Atrioventricular block, complete: Secondary | ICD-10-CM | POA: Diagnosis not present

## 2022-08-04 DIAGNOSIS — R278 Other lack of coordination: Secondary | ICD-10-CM | POA: Diagnosis not present

## 2022-08-04 DIAGNOSIS — M7022 Olecranon bursitis, left elbow: Secondary | ICD-10-CM | POA: Diagnosis not present

## 2022-08-04 DIAGNOSIS — S42201D Unspecified fracture of upper end of right humerus, subsequent encounter for fracture with routine healing: Secondary | ICD-10-CM | POA: Diagnosis not present

## 2022-08-04 DIAGNOSIS — Z9181 History of falling: Secondary | ICD-10-CM | POA: Diagnosis not present

## 2022-08-04 DIAGNOSIS — D696 Thrombocytopenia, unspecified: Secondary | ICD-10-CM | POA: Diagnosis not present

## 2022-08-04 DIAGNOSIS — S42402D Unspecified fracture of lower end of left humerus, subsequent encounter for fracture with routine healing: Secondary | ICD-10-CM | POA: Diagnosis not present

## 2022-08-05 DIAGNOSIS — D696 Thrombocytopenia, unspecified: Secondary | ICD-10-CM | POA: Diagnosis not present

## 2022-08-05 DIAGNOSIS — I495 Sick sinus syndrome: Secondary | ICD-10-CM | POA: Diagnosis not present

## 2022-08-05 DIAGNOSIS — E1140 Type 2 diabetes mellitus with diabetic neuropathy, unspecified: Secondary | ICD-10-CM | POA: Diagnosis not present

## 2022-08-05 DIAGNOSIS — R262 Difficulty in walking, not elsewhere classified: Secondary | ICD-10-CM | POA: Diagnosis not present

## 2022-08-05 DIAGNOSIS — Z9181 History of falling: Secondary | ICD-10-CM | POA: Diagnosis not present

## 2022-08-05 DIAGNOSIS — S42402D Unspecified fracture of lower end of left humerus, subsequent encounter for fracture with routine healing: Secondary | ICD-10-CM | POA: Diagnosis not present

## 2022-08-05 DIAGNOSIS — R2681 Unsteadiness on feet: Secondary | ICD-10-CM | POA: Diagnosis not present

## 2022-08-05 DIAGNOSIS — M19122 Post-traumatic osteoarthritis, left elbow: Secondary | ICD-10-CM | POA: Diagnosis not present

## 2022-08-05 DIAGNOSIS — M6281 Muscle weakness (generalized): Secondary | ICD-10-CM | POA: Diagnosis not present

## 2022-08-05 DIAGNOSIS — R2689 Other abnormalities of gait and mobility: Secondary | ICD-10-CM | POA: Diagnosis not present

## 2022-08-05 DIAGNOSIS — I442 Atrioventricular block, complete: Secondary | ICD-10-CM | POA: Diagnosis not present

## 2022-08-05 DIAGNOSIS — S42201D Unspecified fracture of upper end of right humerus, subsequent encounter for fracture with routine healing: Secondary | ICD-10-CM | POA: Diagnosis not present

## 2022-08-05 DIAGNOSIS — R278 Other lack of coordination: Secondary | ICD-10-CM | POA: Diagnosis not present

## 2022-08-05 DIAGNOSIS — Z950 Presence of cardiac pacemaker: Secondary | ICD-10-CM | POA: Diagnosis not present

## 2022-08-05 DIAGNOSIS — M7022 Olecranon bursitis, left elbow: Secondary | ICD-10-CM | POA: Diagnosis not present

## 2022-08-08 ENCOUNTER — Encounter: Payer: Self-pay | Admitting: Podiatry

## 2022-08-08 ENCOUNTER — Ambulatory Visit (INDEPENDENT_AMBULATORY_CARE_PROVIDER_SITE_OTHER): Payer: Medicare Other | Admitting: Podiatry

## 2022-08-08 DIAGNOSIS — M79675 Pain in left toe(s): Secondary | ICD-10-CM

## 2022-08-08 DIAGNOSIS — B351 Tinea unguium: Secondary | ICD-10-CM

## 2022-08-08 DIAGNOSIS — M79674 Pain in right toe(s): Secondary | ICD-10-CM

## 2022-08-08 DIAGNOSIS — E1140 Type 2 diabetes mellitus with diabetic neuropathy, unspecified: Secondary | ICD-10-CM

## 2022-08-09 ENCOUNTER — Non-Acute Institutional Stay (SKILLED_NURSING_FACILITY): Payer: Medicare Other | Admitting: Nurse Practitioner

## 2022-08-09 ENCOUNTER — Encounter: Payer: Self-pay | Admitting: Nurse Practitioner

## 2022-08-09 DIAGNOSIS — E1140 Type 2 diabetes mellitus with diabetic neuropathy, unspecified: Secondary | ICD-10-CM | POA: Diagnosis not present

## 2022-08-09 DIAGNOSIS — R2681 Unsteadiness on feet: Secondary | ICD-10-CM | POA: Diagnosis not present

## 2022-08-09 DIAGNOSIS — D696 Thrombocytopenia, unspecified: Secondary | ICD-10-CM | POA: Diagnosis not present

## 2022-08-09 DIAGNOSIS — E440 Moderate protein-calorie malnutrition: Secondary | ICD-10-CM

## 2022-08-09 DIAGNOSIS — S42201D Unspecified fracture of upper end of right humerus, subsequent encounter for fracture with routine healing: Secondary | ICD-10-CM | POA: Diagnosis not present

## 2022-08-09 DIAGNOSIS — D649 Anemia, unspecified: Secondary | ICD-10-CM | POA: Diagnosis not present

## 2022-08-09 DIAGNOSIS — M6281 Muscle weakness (generalized): Secondary | ICD-10-CM | POA: Diagnosis not present

## 2022-08-09 DIAGNOSIS — Z9181 History of falling: Secondary | ICD-10-CM | POA: Diagnosis not present

## 2022-08-09 DIAGNOSIS — R2689 Other abnormalities of gait and mobility: Secondary | ICD-10-CM | POA: Diagnosis not present

## 2022-08-09 DIAGNOSIS — G3184 Mild cognitive impairment, so stated: Secondary | ICD-10-CM | POA: Diagnosis not present

## 2022-08-09 DIAGNOSIS — M19122 Post-traumatic osteoarthritis, left elbow: Secondary | ICD-10-CM | POA: Diagnosis not present

## 2022-08-09 DIAGNOSIS — I5032 Chronic diastolic (congestive) heart failure: Secondary | ICD-10-CM

## 2022-08-09 DIAGNOSIS — I482 Chronic atrial fibrillation, unspecified: Secondary | ICD-10-CM | POA: Diagnosis not present

## 2022-08-09 DIAGNOSIS — R262 Difficulty in walking, not elsewhere classified: Secondary | ICD-10-CM | POA: Diagnosis not present

## 2022-08-09 DIAGNOSIS — I495 Sick sinus syndrome: Secondary | ICD-10-CM | POA: Diagnosis not present

## 2022-08-09 DIAGNOSIS — Z950 Presence of cardiac pacemaker: Secondary | ICD-10-CM | POA: Diagnosis not present

## 2022-08-09 DIAGNOSIS — I442 Atrioventricular block, complete: Secondary | ICD-10-CM | POA: Diagnosis not present

## 2022-08-09 DIAGNOSIS — M7022 Olecranon bursitis, left elbow: Secondary | ICD-10-CM | POA: Diagnosis not present

## 2022-08-09 DIAGNOSIS — R278 Other lack of coordination: Secondary | ICD-10-CM | POA: Diagnosis not present

## 2022-08-09 DIAGNOSIS — S42402D Unspecified fracture of lower end of left humerus, subsequent encounter for fracture with routine healing: Secondary | ICD-10-CM | POA: Diagnosis not present

## 2022-08-09 NOTE — Progress Notes (Signed)
Location:  Other [place] [facility].  Nursing Home Room Number: [facility] [address] Place of Service:  SNF [phone]) [name], NP  PCP: [name], MD  Patient Care Team: [name], MD as PCP - General (Family Medicine) [name], MD as PCP - Cardiology (Cardiology) [name], MD as Consulting Physician (Cardiology)  Extended Emergency Contact Information Primary Emergency Contact: [name] [facility]) Address: [address]          [place], [place] [address] [name] of [employer] Phone: [phone] Relation: Son  Goals of care: Advanced Directive information    08/09/2022    9:33 AM  Advanced Directives  Does Patient Have a Medical Advance Directive? Yes  Type of Estate agent of [place];Out of facility DNR (pink MOST or yellow form);Living will  Does patient want to make changes to medical advance directive? No - Patient declined  Copy of Healthcare Power of Attorney in Chart? Yes - validated most recent copy scanned in chart (See row information)     Chief Complaint  Patient presents with   Medical Management of Chronic Issues    Medical Management of Chronic Issues.     HPI:  Pt is a 89 y.o. male seen today for medical management of chronic disease.  Staff reports patient was complaining of headache, neck pain and chest pain (but pointing to throat) Pt states no pain at this time.  He reports he was having voice box pain when laying in bed. Does not feel like it was after a meal or related to eating. Denies GERD/indigestion.  Staff reports progressive memory loss. He had some weight gain but over the last month has lost ~ 5 lbs.  Denies trouble eating or trouble swallowing.  No diarrhea or constipation.  No trouble urinating    Past Medical History:  Diagnosis Date   Asthma    Atrial flutter (HCC) 2007   Band keratopathy    Benign prostatic hypertrophy    Chronic ear infection    Chronic kidney disease     Chronic prostatitis    Dental bridge present    permanent - upper   Diabetes mellitus    Elbow stiffness, left    s/p fracture many yrs ago.  arm does not straighten   GERD (gastroesophageal reflux disease)    laryngeal involvement   Hyperlipidemia    Hypertension    Obstructive sleep apnea    CPAP-9   Osteoarthrosis, localized, primary, knee    post-traumatic   Prostatitis    Stroke (HCC)    TIA   Tachy-brady syndrome (HCC)    UTI (urinary tract infection)    Past Surgical History:  Procedure Laterality Date   APPENDECTOMY     BELPHAROPTOSIS REPAIR     Dr [name]---didn't resolve weepy eye and eyelid drooping   CARDIOVERSION  04/13/2010   CATARACT EXTRACTION, BILATERAL  2009   Chest pain  8/12   Stress test benign   ESOPHAGEAL DILATION  05/26/2016   Procedure: ESOPHAGEAL DILATION;  Surgeon: [name], MD;  Location: [facility];  Service: Endoscopy;;   ESOPHAGOGASTRODUODENOSCOPY (EGD) WITH PROPOFOL N/A 05/26/2016   Procedure: ESOPHAGOGASTRODUODENOSCOPY (EGD) WITH PROPOFOL;  Surgeon: [name], MD;  Location: [facility];  Service: Endoscopy;  Laterality: N/A;  Diabetic - oral meds sleep apnea   EYE SURGERY     FRACTURE SURGERY Left    elbow   KNEE SURGERY  1998   plate after fracture, then removed for infection  left elbow surgery     MASTOIDECTOMY  8/08   Dr [name]   PACEMAKER IMPLANT N/A 09/02/2021   Procedure: PACEMAKER IMPLANT;  Surgeon: [name], MD;  Location: [facility] INVASIVE CV LAB;  Service: Cardiovascular;  Laterality: N/A;   RHINOPLASTY  5/10   and septoplasty   SUBACROMIAL DECOMPRESSION Right 2005   Arthroscopic (for rotator cuff and biceps tendon ruptures)   TEAR DUCT PROBING  11/13   Dr [name]   TOTAL KNEE ARTHROPLASTY Left 09/01/2014   Procedure: TOTAL KNEE ARTHROPLASTY;  Surgeon: [name], MD;  Location: [facility] ORS;  Service: Orthopedics;  Laterality: Left;   TRIGGER FINGER RELEASE Left 02/25/2015   Procedure: LEFT  LONG TRIGGER RELEASE;  Surgeon: [name], MD;  Location: [facility] ORS;  Service: Orthopedics;  Laterality: Left;   VENOUS ABLATION      Allergies  Allergen Reactions   Lovenox [Enoxaparin] Hives   Proscar [Finasteride] Other (See Comments)    Upper body and arm weakness   Latex Rash    Has trouble with BANDAIDS that have been left on for more than 24 hours. Prefers paper tape.   Nickel Rash    Localized rash   Percocet [Oxycodone-Acetaminophen] Rash   Tape Rash and Other (See Comments)    Sensitivity     Outpatient Encounter Medications as of 08/09/2022  Medication Sig   acetaminophen (TYLENOL) 500 MG tablet Take 1,000 mg by mouth every 8 (eight) hours as needed.   apixaban (ELIQUIS) 2.5 MG TABS tablet Take 2.5 mg by mouth 2 (two) times daily.   APPLE CIDER VINEGAR PO Take 1 capsule by mouth at bedtime.   atorvastatin (LIPITOR) 80 MG tablet Take 1 tablet (80 mg total) by mouth daily.   Cranberry 250 MG TABS Take 1 tablet by mouth 2 (two) times daily.   Cyanocobalamin (B-12 PO) Take 1,000 mg by mouth daily.   finasteride (PROSCAR) 5 MG tablet Take 1 tablet (5 mg total) by mouth daily.   guaiFENesin (TUSSIN PO) Take 10 mLs by mouth every 4 (four) hours as needed.   lactose free nutrition (BOOST) LIQD Take 237 mLs by mouth 3 (three) times daily between meals.   lidocaine (LIDODERM) 5 % Place 1 patch onto the skin daily. Remove & Discard patch within 12 hours or as directed by MD   Magnesium Oxide 400 MG CAPS Take 1 capsule (400 mg total) by mouth daily.   metFORMIN (GLUCOPHAGE) 500 MG tablet TAKE 1 TABLET BY MOUTH  TWICE DAILY WITH MEALS   Polyethyl Glycol-Propyl Glycol (SYSTANE ULTRA OP) Place 1 drop into both eyes daily as needed (dry eyes).   polyethylene glycol (MIRALAX / GLYCOLAX) 17 g packet Take 17 g by mouth. Daily every Monday, Wednesday and Friday.   sertraline (ZOLOFT) 50 MG tablet Take 50 mg by mouth daily.   silver sulfADIAZINE (SILVADENE) 1 % cream Apply 1 Application  topically daily.   sodium chloride (OCEAN) 0.65 % SOLN nasal spray Place 2 sprays into both nostrils 2 (two) times daily as needed for congestion.   tamsulosin (FLOMAX) 0.4 MG CAPS capsule Take 2 capsules (0.8 mg total) by mouth at bedtime.   Wheat Dextrin (BENEFIBER) POWD Give 2 tsp by mouth in the morning for Bowel Regulation mix in 8oz liquid of resident's choice   Zinc Oxide (TRIPLE PASTE) 12.8 % ointment Apply 1 Application topically. Every shift to bilateral buttocks.   No facility-administered encounter medications on file as of 08/09/2022.    Review of  Systems  Constitutional:  Negative for activity change, appetite change, fatigue and unexpected weight change.  HENT:  Negative for congestion and hearing loss.   Eyes: Negative.   Respiratory:  Negative for cough and shortness of breath.   Cardiovascular:  Negative for chest pain, palpitations and leg swelling.  Gastrointestinal:  Negative for abdominal pain, constipation and diarrhea.  Genitourinary:  Negative for difficulty urinating and dysuria.  Musculoskeletal:  Negative for arthralgias and myalgias.  Skin:  Negative for color change and wound.  Neurological:  Negative for dizziness and weakness.  Psychiatric/Behavioral:  Negative for agitation, behavioral problems and confusion.      Immunization History  Administered Date(s) Administered   Fluad Quad(high Dose 65+) 09/14/2018   H1N1 12/13/2007   Influenza Split 09/11/2010   Influenza Whole 09/12/2011   Influenza, High Dose Seasonal PF 09/20/2012, 09/07/2017   Influenza, Seasonal, Injecte, Preservative Fre 09/08/2007, 09/26/2008, 10/02/2014, 09/18/2015   Influenza,inj,Quad PF,6+ Mos 09/19/2013, 09/21/2015   Influenza-Unspecified 09/07/2017, 09/11/2019, 09/03/2020, 10/19/2021   Moderna Sars-Covid-2 Vaccination 01/18/2019, 02/15/2019, 11/19/2019, 10/17/2020, 06/01/2021   PNEUMOCOCCAL CONJUGATE-20 12/13/2021   Pneumococcal Conjugate-13 09/19/2013   Pneumococcal  Polysaccharide-23 09/26/2003, 06/28/2016   Td 08/04/1998   Tdap 06/04/2010, 08/15/2021   Tetanus 08/04/1998   Unspecified SARS-COV-2 Vaccination 06/01/2021   Zoster, Live 01/16/2004   Pertinent  Health Maintenance Due  Topic Date Due   INFLUENZA VACCINE  08/04/2022   FOOT EXAM  08/20/2022   HEMOGLOBIN A1C  11/05/2022   OPHTHALMOLOGY EXAM  06/01/2023      12/05/2021   10:38 PM 12/06/2021    7:05 AM 12/06/2021    7:40 PM 12/07/2021    8:45 AM 06/28/2022    2:03 PM  Fall Risk  Falls in the past year?     1  Was there an injury with Fall?     1  Fall Risk Category Calculator     3  (RETIRED) Patient Fall Risk Level Moderate fall risk High fall risk High fall risk High fall risk   Patient at Risk for Falls Due to     History of fall(s);Impaired balance/gait;Impaired mobility;Impaired vision   Functional Status Survey:    Vitals:   08/09/22 0926  BP: 97/61  Pulse: 73  Resp: 16  Temp: 98.3 F (36.8 C)  SpO2: 96%  Weight: 159 lb 12.8 oz (72.5 kg)  Height: 5\' 10"  (1.778 m)   Body mass index is 22.93 kg/m. Physical Exam Constitutional:      General: He is not in acute distress.    Appearance: He is well-developed. He is not diaphoretic.  HENT:     Head: Normocephalic and atraumatic.     Right Ear: External ear normal.     Left Ear: External ear normal.     Mouth/Throat:     Pharynx: No oropharyngeal exudate.  Eyes:     Conjunctiva/sclera: Conjunctivae normal.     Pupils: Pupils are equal, round, and reactive to light.  Cardiovascular:     Rate and Rhythm: Normal rate and regular rhythm.     Heart sounds: Normal heart sounds.  Pulmonary:     Effort: Pulmonary effort is normal.     Breath sounds: Normal breath sounds.  Abdominal:     General: Bowel sounds are normal.     Palpations: Abdomen is soft.  Musculoskeletal:        General: No tenderness.     Cervical back: Normal range of motion and neck supple.     Right lower leg:  No edema.     Left lower leg: No  edema.  Skin:    General: Skin is warm and dry.  Neurological:     Mental Status: He is alert and oriented to person, place, and time.     Labs reviewed: Recent Labs    09/02/21 0311 09/03/21 0457 09/16/21 0949 12/04/21 0118 12/04/21 0500 12/06/21 0348 02/08/22 1438 02/14/22 0000 05/12/22 0000  NA 138 137  --    < > 136 132* 134* 138 138  K 3.6 3.9  --    < > 3.6 3.7 3.9 3.5 3.9  CL 109 105  --    < > 105 103 96* 103 103  CO2 25 25  --    < > 25 22 24  31* 30*  GLUCOSE 125* 112*  --    < > 164* 166* 281*  --   --   BUN 26* 22  --    < > 25* 26* 50* 37* 26*  CREATININE 0.91 0.92  --    < > 0.79 0.79 1.20 1.0 0.7  CALCIUM 10.0 10.5*  --    < > 9.8 9.8 10.0 9.8 10.3  MG 1.5* 1.6* 1.6  --   --   --   --   --   --    < > = values in this interval not displayed.   Recent Labs    02/08/22 1438 05/12/22 0000  AST 42* 13*  ALT 25 12  ALKPHOS 77 55  BILITOT 1.0  --   PROT 6.6  --   ALBUMIN 2.9* 3.1*   Recent Labs    12/06/21 0348 12/07/21 0623 02/08/22 1438 02/08/22 1438 02/14/22 0000 02/20/22 0000 02/24/22 0000 03/17/22 0000 03/24/22 0000 05/12/22 0000  WBC 11.0* 10.6* 11.9*   < > 5.4 6.4 6.9 8.4 9.3 6.9  NEUTROABS  --   --   --   --  3,440.00 4,736.00 4,720.00  --   --   --   HGB 10.4* 10.7* 12.5*  --  11.5* 10.7* 10.7* 9.5* 10.0* 10.6*  HCT 30.8* 30.9* 39.0  --  34* 32* 33* 29* 30* 32*  MCV 93.1 93.1 94.2  --   --   --   --   --   --   --   PLT 138* 139* 189  --  110* 143* 185 152 176 144*   < > = values in this interval not displayed.   Lab Results  Component Value Date   TSH 0.76 12/11/2012   Lab Results  Component Value Date   HGBA1C 7.1 05/05/2022   Lab Results  Component Value Date   CHOL 99 05/05/2022   HDL 48 05/05/2022   LDLCALC 37 05/05/2022   TRIG 48 05/05/2022   CHOLHDL 2 02/25/2021    Significant Diagnostic Results in last 30 days:  No results found.  Assessment/Plan 1. Type 2 diabetes, controlled, with neuropathy (HCC) A1c at  goal. Encouraged dietary compliance, routine foot care/monitoring and to keep up with diabetic eye exams through ophthalmology  -will continue current regimen  2. Moderate protein-calorie malnutrition (HCC) -continue supplement with 3 meals a day.   3. Atrial fibrillation, chronic (HCC) Rate controlled, continues on eliquis 2.5 mg BID for anticoaguation  4. Chronic diastolic heart failure (HCC) Euvolemic at this time. Will continue to monitor.   5. Anemia, unspecified type -stable, will continue to monitor  6. MCI (mild cognitive impairment) Noted by staff and family, will monitor.  Janene Harvey. Biagio Borg [facility] [phone]

## 2022-08-10 ENCOUNTER — Ambulatory Visit: Payer: Medicare Other | Admitting: Podiatry

## 2022-08-10 DIAGNOSIS — M6281 Muscle weakness (generalized): Secondary | ICD-10-CM | POA: Diagnosis not present

## 2022-08-10 DIAGNOSIS — R2681 Unsteadiness on feet: Secondary | ICD-10-CM | POA: Diagnosis not present

## 2022-08-10 DIAGNOSIS — M7022 Olecranon bursitis, left elbow: Secondary | ICD-10-CM | POA: Diagnosis not present

## 2022-08-10 DIAGNOSIS — D696 Thrombocytopenia, unspecified: Secondary | ICD-10-CM | POA: Diagnosis not present

## 2022-08-10 DIAGNOSIS — M19122 Post-traumatic osteoarthritis, left elbow: Secondary | ICD-10-CM | POA: Diagnosis not present

## 2022-08-10 DIAGNOSIS — I442 Atrioventricular block, complete: Secondary | ICD-10-CM | POA: Diagnosis not present

## 2022-08-10 DIAGNOSIS — S42201D Unspecified fracture of upper end of right humerus, subsequent encounter for fracture with routine healing: Secondary | ICD-10-CM | POA: Diagnosis not present

## 2022-08-10 DIAGNOSIS — Z9181 History of falling: Secondary | ICD-10-CM | POA: Diagnosis not present

## 2022-08-10 DIAGNOSIS — I495 Sick sinus syndrome: Secondary | ICD-10-CM | POA: Diagnosis not present

## 2022-08-10 DIAGNOSIS — S42402D Unspecified fracture of lower end of left humerus, subsequent encounter for fracture with routine healing: Secondary | ICD-10-CM | POA: Diagnosis not present

## 2022-08-10 DIAGNOSIS — E1140 Type 2 diabetes mellitus with diabetic neuropathy, unspecified: Secondary | ICD-10-CM | POA: Diagnosis not present

## 2022-08-10 DIAGNOSIS — R278 Other lack of coordination: Secondary | ICD-10-CM | POA: Diagnosis not present

## 2022-08-10 DIAGNOSIS — R262 Difficulty in walking, not elsewhere classified: Secondary | ICD-10-CM | POA: Diagnosis not present

## 2022-08-10 DIAGNOSIS — R2689 Other abnormalities of gait and mobility: Secondary | ICD-10-CM | POA: Diagnosis not present

## 2022-08-10 DIAGNOSIS — Z950 Presence of cardiac pacemaker: Secondary | ICD-10-CM | POA: Diagnosis not present

## 2022-08-11 DIAGNOSIS — M19122 Post-traumatic osteoarthritis, left elbow: Secondary | ICD-10-CM | POA: Diagnosis not present

## 2022-08-11 DIAGNOSIS — D696 Thrombocytopenia, unspecified: Secondary | ICD-10-CM | POA: Diagnosis not present

## 2022-08-11 DIAGNOSIS — S42402D Unspecified fracture of lower end of left humerus, subsequent encounter for fracture with routine healing: Secondary | ICD-10-CM | POA: Diagnosis not present

## 2022-08-11 DIAGNOSIS — Z950 Presence of cardiac pacemaker: Secondary | ICD-10-CM | POA: Diagnosis not present

## 2022-08-11 DIAGNOSIS — S42201D Unspecified fracture of upper end of right humerus, subsequent encounter for fracture with routine healing: Secondary | ICD-10-CM | POA: Diagnosis not present

## 2022-08-11 DIAGNOSIS — Z9181 History of falling: Secondary | ICD-10-CM | POA: Diagnosis not present

## 2022-08-11 DIAGNOSIS — M7022 Olecranon bursitis, left elbow: Secondary | ICD-10-CM | POA: Diagnosis not present

## 2022-08-11 DIAGNOSIS — R2681 Unsteadiness on feet: Secondary | ICD-10-CM | POA: Diagnosis not present

## 2022-08-11 DIAGNOSIS — I442 Atrioventricular block, complete: Secondary | ICD-10-CM | POA: Diagnosis not present

## 2022-08-11 DIAGNOSIS — R278 Other lack of coordination: Secondary | ICD-10-CM | POA: Diagnosis not present

## 2022-08-11 DIAGNOSIS — R262 Difficulty in walking, not elsewhere classified: Secondary | ICD-10-CM | POA: Diagnosis not present

## 2022-08-11 DIAGNOSIS — I495 Sick sinus syndrome: Secondary | ICD-10-CM | POA: Diagnosis not present

## 2022-08-11 DIAGNOSIS — M6281 Muscle weakness (generalized): Secondary | ICD-10-CM | POA: Diagnosis not present

## 2022-08-11 DIAGNOSIS — E1140 Type 2 diabetes mellitus with diabetic neuropathy, unspecified: Secondary | ICD-10-CM | POA: Diagnosis not present

## 2022-08-11 DIAGNOSIS — R2689 Other abnormalities of gait and mobility: Secondary | ICD-10-CM | POA: Diagnosis not present

## 2022-08-12 DIAGNOSIS — R262 Difficulty in walking, not elsewhere classified: Secondary | ICD-10-CM | POA: Diagnosis not present

## 2022-08-12 DIAGNOSIS — M19122 Post-traumatic osteoarthritis, left elbow: Secondary | ICD-10-CM | POA: Diagnosis not present

## 2022-08-12 DIAGNOSIS — Z950 Presence of cardiac pacemaker: Secondary | ICD-10-CM | POA: Diagnosis not present

## 2022-08-12 DIAGNOSIS — R278 Other lack of coordination: Secondary | ICD-10-CM | POA: Diagnosis not present

## 2022-08-12 DIAGNOSIS — Z9181 History of falling: Secondary | ICD-10-CM | POA: Diagnosis not present

## 2022-08-12 DIAGNOSIS — R2689 Other abnormalities of gait and mobility: Secondary | ICD-10-CM | POA: Diagnosis not present

## 2022-08-12 DIAGNOSIS — E1140 Type 2 diabetes mellitus with diabetic neuropathy, unspecified: Secondary | ICD-10-CM | POA: Diagnosis not present

## 2022-08-12 DIAGNOSIS — I495 Sick sinus syndrome: Secondary | ICD-10-CM | POA: Diagnosis not present

## 2022-08-12 DIAGNOSIS — S42402D Unspecified fracture of lower end of left humerus, subsequent encounter for fracture with routine healing: Secondary | ICD-10-CM | POA: Diagnosis not present

## 2022-08-12 DIAGNOSIS — M7022 Olecranon bursitis, left elbow: Secondary | ICD-10-CM | POA: Diagnosis not present

## 2022-08-12 DIAGNOSIS — R2681 Unsteadiness on feet: Secondary | ICD-10-CM | POA: Diagnosis not present

## 2022-08-12 DIAGNOSIS — I442 Atrioventricular block, complete: Secondary | ICD-10-CM | POA: Diagnosis not present

## 2022-08-12 DIAGNOSIS — D696 Thrombocytopenia, unspecified: Secondary | ICD-10-CM | POA: Diagnosis not present

## 2022-08-12 DIAGNOSIS — S42201D Unspecified fracture of upper end of right humerus, subsequent encounter for fracture with routine healing: Secondary | ICD-10-CM | POA: Diagnosis not present

## 2022-08-12 DIAGNOSIS — M6281 Muscle weakness (generalized): Secondary | ICD-10-CM | POA: Diagnosis not present

## 2022-08-16 DIAGNOSIS — M6281 Muscle weakness (generalized): Secondary | ICD-10-CM | POA: Diagnosis not present

## 2022-08-16 DIAGNOSIS — E1140 Type 2 diabetes mellitus with diabetic neuropathy, unspecified: Secondary | ICD-10-CM | POA: Diagnosis not present

## 2022-08-16 DIAGNOSIS — R2689 Other abnormalities of gait and mobility: Secondary | ICD-10-CM | POA: Diagnosis not present

## 2022-08-16 DIAGNOSIS — I442 Atrioventricular block, complete: Secondary | ICD-10-CM | POA: Diagnosis not present

## 2022-08-16 DIAGNOSIS — R278 Other lack of coordination: Secondary | ICD-10-CM | POA: Diagnosis not present

## 2022-08-16 DIAGNOSIS — Z9181 History of falling: Secondary | ICD-10-CM | POA: Diagnosis not present

## 2022-08-16 DIAGNOSIS — S42201D Unspecified fracture of upper end of right humerus, subsequent encounter for fracture with routine healing: Secondary | ICD-10-CM | POA: Diagnosis not present

## 2022-08-16 DIAGNOSIS — R262 Difficulty in walking, not elsewhere classified: Secondary | ICD-10-CM | POA: Diagnosis not present

## 2022-08-16 DIAGNOSIS — D696 Thrombocytopenia, unspecified: Secondary | ICD-10-CM | POA: Diagnosis not present

## 2022-08-16 DIAGNOSIS — S42402D Unspecified fracture of lower end of left humerus, subsequent encounter for fracture with routine healing: Secondary | ICD-10-CM | POA: Diagnosis not present

## 2022-08-16 DIAGNOSIS — R2681 Unsteadiness on feet: Secondary | ICD-10-CM | POA: Diagnosis not present

## 2022-08-16 DIAGNOSIS — M19122 Post-traumatic osteoarthritis, left elbow: Secondary | ICD-10-CM | POA: Diagnosis not present

## 2022-08-16 DIAGNOSIS — Z950 Presence of cardiac pacemaker: Secondary | ICD-10-CM | POA: Diagnosis not present

## 2022-08-16 DIAGNOSIS — I495 Sick sinus syndrome: Secondary | ICD-10-CM | POA: Diagnosis not present

## 2022-08-16 DIAGNOSIS — M7022 Olecranon bursitis, left elbow: Secondary | ICD-10-CM | POA: Diagnosis not present

## 2022-08-17 ENCOUNTER — Ambulatory Visit (INDEPENDENT_AMBULATORY_CARE_PROVIDER_SITE_OTHER): Payer: Medicare Other | Admitting: Podiatry

## 2022-08-17 ENCOUNTER — Encounter: Payer: Self-pay | Admitting: Podiatry

## 2022-08-17 DIAGNOSIS — R2689 Other abnormalities of gait and mobility: Secondary | ICD-10-CM

## 2022-08-17 DIAGNOSIS — R269 Unspecified abnormalities of gait and mobility: Secondary | ICD-10-CM | POA: Diagnosis not present

## 2022-08-17 DIAGNOSIS — M21371 Foot drop, right foot: Secondary | ICD-10-CM

## 2022-08-17 NOTE — Progress Notes (Signed)
  Subjective:  Patient ID: Michael Colon, male    DOB: 1930-09-16,  MRN: [id number]  [name] presents to clinic today for preventative diabetic foot care and painful thick toenails that are difficult to trim. Pain interferes with ambulation. Aggravating factors include wearing enclosed shoe gear. Pain is relieved with periodic professional debridement. He is a resident of [facility]. Accompanied by caregiver, Deja, on today's visit. Patient inquiring about obtaining brace for weak LE secondary to CVA.  New problem(s): None.   PCP is [name], MD.  Allergies  Allergen Reactions   Lovenox [Enoxaparin] Hives   Proscar [Finasteride] Other (See Comments)    Upper body and arm weakness   Latex Rash    Has trouble with BANDAIDS that have been left on for more than 24 hours. Prefers paper tape.   Nickel Rash    Localized rash   Percocet [Oxycodone-Acetaminophen] Rash   Tape Rash and Other (See Comments)    Sensitivity     Review of Systems: Negative except as noted in the HPI.  Objective: No changes noted in today's physical examination. There were no vitals filed for this visit. Michael Colon is a pleasant 89 y.o. male WD, WN in NAD. AAO x 3.  Vascular Examination: CFT <3 seconds b/l. DP/PT pulses faintly palpable b/l. Skin temperature gradient warm to warm b/l. No pain with calf compression. No ischemia or gangrene. No cyanosis or clubbing noted b/l. Pedal hair absent.   Neurological Examination: Sensation grossly intact b/l with 10 gram monofilament. Vibratory sensation intact b/l. Pt has subjective symptoms of neuropathy.  Dermatological Examination: Pedal skin warm and supple b/l.   No open wounds. No interdigital macerations.  Toenails 1-5 b/l thick, discolored, elongated with subungual debris and pain on dorsal palpation.    No hyperkeratotic nor porokeratotic lesions present on today's visit.  Musculoskeletal Examination: Muscle strength 5/5 to RLE.  Hammertoe deformity noted 2-5 b/l. Utilizes walker for mobility assistance on today's visit.  Radiographs: None  Assessment/Plan: 1. Pain due to onychomycosis of toenails of both feet   2. Type 2 diabetes, controlled, with neuropathy (HCC)    -Caregiver/provider present with patient on today's visit. -Regarding brace, patient will see one of the surgeons for evaluation since he is s/p CVA with LE weakness. -Patient to continue soft, supportive shoe gear daily. -Toenails 1-5 b/l were debrided in length and girth with sterile nail nippers and dremel without iatrogenic bleeding.  -Patient/POA to call should there be question/concern in the interim.   Return in about 3 months (around 11/08/2022).  Freddie Breech, DPM

## 2022-08-18 DIAGNOSIS — S42201D Unspecified fracture of upper end of right humerus, subsequent encounter for fracture with routine healing: Secondary | ICD-10-CM | POA: Diagnosis not present

## 2022-08-18 DIAGNOSIS — E1140 Type 2 diabetes mellitus with diabetic neuropathy, unspecified: Secondary | ICD-10-CM | POA: Diagnosis not present

## 2022-08-18 DIAGNOSIS — R2689 Other abnormalities of gait and mobility: Secondary | ICD-10-CM | POA: Diagnosis not present

## 2022-08-18 DIAGNOSIS — M7022 Olecranon bursitis, left elbow: Secondary | ICD-10-CM | POA: Diagnosis not present

## 2022-08-18 DIAGNOSIS — R278 Other lack of coordination: Secondary | ICD-10-CM | POA: Diagnosis not present

## 2022-08-18 DIAGNOSIS — I442 Atrioventricular block, complete: Secondary | ICD-10-CM | POA: Diagnosis not present

## 2022-08-18 DIAGNOSIS — R2681 Unsteadiness on feet: Secondary | ICD-10-CM | POA: Diagnosis not present

## 2022-08-18 DIAGNOSIS — I495 Sick sinus syndrome: Secondary | ICD-10-CM | POA: Diagnosis not present

## 2022-08-18 DIAGNOSIS — R262 Difficulty in walking, not elsewhere classified: Secondary | ICD-10-CM | POA: Diagnosis not present

## 2022-08-18 DIAGNOSIS — M19122 Post-traumatic osteoarthritis, left elbow: Secondary | ICD-10-CM | POA: Diagnosis not present

## 2022-08-18 DIAGNOSIS — Z9181 History of falling: Secondary | ICD-10-CM | POA: Diagnosis not present

## 2022-08-18 DIAGNOSIS — Z950 Presence of cardiac pacemaker: Secondary | ICD-10-CM | POA: Diagnosis not present

## 2022-08-18 DIAGNOSIS — M6281 Muscle weakness (generalized): Secondary | ICD-10-CM | POA: Diagnosis not present

## 2022-08-18 DIAGNOSIS — D696 Thrombocytopenia, unspecified: Secondary | ICD-10-CM | POA: Diagnosis not present

## 2022-08-18 DIAGNOSIS — S42402D Unspecified fracture of lower end of left humerus, subsequent encounter for fracture with routine healing: Secondary | ICD-10-CM | POA: Diagnosis not present

## 2022-08-19 DIAGNOSIS — R2689 Other abnormalities of gait and mobility: Secondary | ICD-10-CM | POA: Diagnosis not present

## 2022-08-19 DIAGNOSIS — M19122 Post-traumatic osteoarthritis, left elbow: Secondary | ICD-10-CM | POA: Diagnosis not present

## 2022-08-19 DIAGNOSIS — M6281 Muscle weakness (generalized): Secondary | ICD-10-CM | POA: Diagnosis not present

## 2022-08-19 DIAGNOSIS — E1140 Type 2 diabetes mellitus with diabetic neuropathy, unspecified: Secondary | ICD-10-CM | POA: Diagnosis not present

## 2022-08-19 DIAGNOSIS — I442 Atrioventricular block, complete: Secondary | ICD-10-CM | POA: Diagnosis not present

## 2022-08-19 DIAGNOSIS — Z9181 History of falling: Secondary | ICD-10-CM | POA: Diagnosis not present

## 2022-08-19 DIAGNOSIS — R278 Other lack of coordination: Secondary | ICD-10-CM | POA: Diagnosis not present

## 2022-08-19 DIAGNOSIS — S42201D Unspecified fracture of upper end of right humerus, subsequent encounter for fracture with routine healing: Secondary | ICD-10-CM | POA: Diagnosis not present

## 2022-08-19 DIAGNOSIS — M7022 Olecranon bursitis, left elbow: Secondary | ICD-10-CM | POA: Diagnosis not present

## 2022-08-19 DIAGNOSIS — D696 Thrombocytopenia, unspecified: Secondary | ICD-10-CM | POA: Diagnosis not present

## 2022-08-19 DIAGNOSIS — I495 Sick sinus syndrome: Secondary | ICD-10-CM | POA: Diagnosis not present

## 2022-08-19 DIAGNOSIS — R262 Difficulty in walking, not elsewhere classified: Secondary | ICD-10-CM | POA: Diagnosis not present

## 2022-08-19 DIAGNOSIS — Z950 Presence of cardiac pacemaker: Secondary | ICD-10-CM | POA: Diagnosis not present

## 2022-08-19 DIAGNOSIS — S42402D Unspecified fracture of lower end of left humerus, subsequent encounter for fracture with routine healing: Secondary | ICD-10-CM | POA: Diagnosis not present

## 2022-08-19 DIAGNOSIS — R2681 Unsteadiness on feet: Secondary | ICD-10-CM | POA: Diagnosis not present

## 2022-08-21 NOTE — Progress Notes (Signed)
  Subjective:  Patient ID: Michael Colon, male    DOB: 1930-10-02,  MRN: [id number]  Chief Complaint  Patient presents with   Foot Pain    "I'm here to get a velcro device to go on my foot."    89 y.o. male presents with the above complaint. History confirmed with patient.  He is here with an attendant from the facility he is at.  He does seem to have some confusion and does not remember being here for his appoint with Dr. Eloy End, the attendant with him says he does have issues falling.  Dr. Eloy End has referred him to me for balance issues for bracing.  Objective:  Physical Exam: Foot is warm well-perfused, there is some right-sided weakness especially in dorsiflexion Assessment:   1. Right foot drop   2. Neurologic gait dysfunction   3. Balance disorder      Plan:  Patient was evaluated and treated and all questions answered.  He does have some gait dysfunction and balance disorder.  There is evidence of slight foot drop.  A classic foot drop brace I think likely would not offer as much medial-lateral balance support.  I recommended a Moore balance brace and he will be scheduled to see our orthotist for fitting for this.  Return to see me as needed.  Return if symptoms worsen or fail to improve.

## 2022-08-23 DIAGNOSIS — D696 Thrombocytopenia, unspecified: Secondary | ICD-10-CM | POA: Diagnosis not present

## 2022-08-23 DIAGNOSIS — M19122 Post-traumatic osteoarthritis, left elbow: Secondary | ICD-10-CM | POA: Diagnosis not present

## 2022-08-23 DIAGNOSIS — I495 Sick sinus syndrome: Secondary | ICD-10-CM | POA: Diagnosis not present

## 2022-08-23 DIAGNOSIS — S42402D Unspecified fracture of lower end of left humerus, subsequent encounter for fracture with routine healing: Secondary | ICD-10-CM | POA: Diagnosis not present

## 2022-08-23 DIAGNOSIS — R278 Other lack of coordination: Secondary | ICD-10-CM | POA: Diagnosis not present

## 2022-08-23 DIAGNOSIS — Z9181 History of falling: Secondary | ICD-10-CM | POA: Diagnosis not present

## 2022-08-23 DIAGNOSIS — I442 Atrioventricular block, complete: Secondary | ICD-10-CM | POA: Diagnosis not present

## 2022-08-23 DIAGNOSIS — S42201D Unspecified fracture of upper end of right humerus, subsequent encounter for fracture with routine healing: Secondary | ICD-10-CM | POA: Diagnosis not present

## 2022-08-23 DIAGNOSIS — M7022 Olecranon bursitis, left elbow: Secondary | ICD-10-CM | POA: Diagnosis not present

## 2022-08-23 DIAGNOSIS — Z950 Presence of cardiac pacemaker: Secondary | ICD-10-CM | POA: Diagnosis not present

## 2022-08-23 DIAGNOSIS — E1140 Type 2 diabetes mellitus with diabetic neuropathy, unspecified: Secondary | ICD-10-CM | POA: Diagnosis not present

## 2022-08-23 DIAGNOSIS — R262 Difficulty in walking, not elsewhere classified: Secondary | ICD-10-CM | POA: Diagnosis not present

## 2022-08-23 DIAGNOSIS — M6281 Muscle weakness (generalized): Secondary | ICD-10-CM | POA: Diagnosis not present

## 2022-08-23 DIAGNOSIS — R2689 Other abnormalities of gait and mobility: Secondary | ICD-10-CM | POA: Diagnosis not present

## 2022-08-23 DIAGNOSIS — R2681 Unsteadiness on feet: Secondary | ICD-10-CM | POA: Diagnosis not present

## 2022-08-25 DIAGNOSIS — M19122 Post-traumatic osteoarthritis, left elbow: Secondary | ICD-10-CM | POA: Diagnosis not present

## 2022-08-25 DIAGNOSIS — I442 Atrioventricular block, complete: Secondary | ICD-10-CM | POA: Diagnosis not present

## 2022-08-25 DIAGNOSIS — S42201D Unspecified fracture of upper end of right humerus, subsequent encounter for fracture with routine healing: Secondary | ICD-10-CM | POA: Diagnosis not present

## 2022-08-25 DIAGNOSIS — R278 Other lack of coordination: Secondary | ICD-10-CM | POA: Diagnosis not present

## 2022-08-25 DIAGNOSIS — Z950 Presence of cardiac pacemaker: Secondary | ICD-10-CM | POA: Diagnosis not present

## 2022-08-25 DIAGNOSIS — D696 Thrombocytopenia, unspecified: Secondary | ICD-10-CM | POA: Diagnosis not present

## 2022-08-25 DIAGNOSIS — Z9181 History of falling: Secondary | ICD-10-CM | POA: Diagnosis not present

## 2022-08-25 DIAGNOSIS — M6281 Muscle weakness (generalized): Secondary | ICD-10-CM | POA: Diagnosis not present

## 2022-08-25 DIAGNOSIS — R262 Difficulty in walking, not elsewhere classified: Secondary | ICD-10-CM | POA: Diagnosis not present

## 2022-08-25 DIAGNOSIS — R2681 Unsteadiness on feet: Secondary | ICD-10-CM | POA: Diagnosis not present

## 2022-08-25 DIAGNOSIS — M7022 Olecranon bursitis, left elbow: Secondary | ICD-10-CM | POA: Diagnosis not present

## 2022-08-25 DIAGNOSIS — S42402D Unspecified fracture of lower end of left humerus, subsequent encounter for fracture with routine healing: Secondary | ICD-10-CM | POA: Diagnosis not present

## 2022-08-25 DIAGNOSIS — I495 Sick sinus syndrome: Secondary | ICD-10-CM | POA: Diagnosis not present

## 2022-08-25 DIAGNOSIS — E1140 Type 2 diabetes mellitus with diabetic neuropathy, unspecified: Secondary | ICD-10-CM | POA: Diagnosis not present

## 2022-08-25 DIAGNOSIS — R2689 Other abnormalities of gait and mobility: Secondary | ICD-10-CM | POA: Diagnosis not present

## 2022-08-26 ENCOUNTER — Ambulatory Visit: Payer: Medicare Other

## 2022-08-26 DIAGNOSIS — M7022 Olecranon bursitis, left elbow: Secondary | ICD-10-CM | POA: Diagnosis not present

## 2022-08-26 DIAGNOSIS — M19122 Post-traumatic osteoarthritis, left elbow: Secondary | ICD-10-CM | POA: Diagnosis not present

## 2022-08-26 DIAGNOSIS — I495 Sick sinus syndrome: Secondary | ICD-10-CM | POA: Diagnosis not present

## 2022-08-26 DIAGNOSIS — I442 Atrioventricular block, complete: Secondary | ICD-10-CM | POA: Diagnosis not present

## 2022-08-26 DIAGNOSIS — R2681 Unsteadiness on feet: Secondary | ICD-10-CM | POA: Diagnosis not present

## 2022-08-26 DIAGNOSIS — R262 Difficulty in walking, not elsewhere classified: Secondary | ICD-10-CM | POA: Diagnosis not present

## 2022-08-26 DIAGNOSIS — S42201D Unspecified fracture of upper end of right humerus, subsequent encounter for fracture with routine healing: Secondary | ICD-10-CM | POA: Diagnosis not present

## 2022-08-26 DIAGNOSIS — D696 Thrombocytopenia, unspecified: Secondary | ICD-10-CM | POA: Diagnosis not present

## 2022-08-26 DIAGNOSIS — S42402D Unspecified fracture of lower end of left humerus, subsequent encounter for fracture with routine healing: Secondary | ICD-10-CM | POA: Diagnosis not present

## 2022-08-26 DIAGNOSIS — Z9181 History of falling: Secondary | ICD-10-CM | POA: Diagnosis not present

## 2022-08-26 DIAGNOSIS — E1140 Type 2 diabetes mellitus with diabetic neuropathy, unspecified: Secondary | ICD-10-CM | POA: Diagnosis not present

## 2022-08-26 DIAGNOSIS — R2689 Other abnormalities of gait and mobility: Secondary | ICD-10-CM | POA: Diagnosis not present

## 2022-08-26 DIAGNOSIS — M6281 Muscle weakness (generalized): Secondary | ICD-10-CM | POA: Diagnosis not present

## 2022-08-26 DIAGNOSIS — Z950 Presence of cardiac pacemaker: Secondary | ICD-10-CM | POA: Diagnosis not present

## 2022-08-26 DIAGNOSIS — R278 Other lack of coordination: Secondary | ICD-10-CM | POA: Diagnosis not present

## 2022-08-26 NOTE — Progress Notes (Signed)
Patient refused to be casted for balance brace patient described ASO figure  8 brace to help decrease his pronation and said he has good dorsi and plantar flexion I wrote a note to nursing home on his report sheet if they need to reschedule they can  Michael Colon Cped, CFo, CFm

## 2022-08-29 DIAGNOSIS — B351 Tinea unguium: Secondary | ICD-10-CM | POA: Diagnosis not present

## 2022-08-29 DIAGNOSIS — E1159 Type 2 diabetes mellitus with other circulatory complications: Secondary | ICD-10-CM | POA: Diagnosis not present

## 2022-08-30 DIAGNOSIS — M6281 Muscle weakness (generalized): Secondary | ICD-10-CM | POA: Diagnosis not present

## 2022-08-30 DIAGNOSIS — R278 Other lack of coordination: Secondary | ICD-10-CM | POA: Diagnosis not present

## 2022-08-30 DIAGNOSIS — M7022 Olecranon bursitis, left elbow: Secondary | ICD-10-CM | POA: Diagnosis not present

## 2022-08-30 DIAGNOSIS — R262 Difficulty in walking, not elsewhere classified: Secondary | ICD-10-CM | POA: Diagnosis not present

## 2022-08-30 DIAGNOSIS — Z9181 History of falling: Secondary | ICD-10-CM | POA: Diagnosis not present

## 2022-08-30 DIAGNOSIS — D696 Thrombocytopenia, unspecified: Secondary | ICD-10-CM | POA: Diagnosis not present

## 2022-08-30 DIAGNOSIS — M19122 Post-traumatic osteoarthritis, left elbow: Secondary | ICD-10-CM | POA: Diagnosis not present

## 2022-08-30 DIAGNOSIS — Z950 Presence of cardiac pacemaker: Secondary | ICD-10-CM | POA: Diagnosis not present

## 2022-08-30 DIAGNOSIS — S42402D Unspecified fracture of lower end of left humerus, subsequent encounter for fracture with routine healing: Secondary | ICD-10-CM | POA: Diagnosis not present

## 2022-08-30 DIAGNOSIS — I495 Sick sinus syndrome: Secondary | ICD-10-CM | POA: Diagnosis not present

## 2022-08-30 DIAGNOSIS — E1140 Type 2 diabetes mellitus with diabetic neuropathy, unspecified: Secondary | ICD-10-CM | POA: Diagnosis not present

## 2022-08-30 DIAGNOSIS — R2689 Other abnormalities of gait and mobility: Secondary | ICD-10-CM | POA: Diagnosis not present

## 2022-08-30 DIAGNOSIS — R2681 Unsteadiness on feet: Secondary | ICD-10-CM | POA: Diagnosis not present

## 2022-08-30 DIAGNOSIS — S42201D Unspecified fracture of upper end of right humerus, subsequent encounter for fracture with routine healing: Secondary | ICD-10-CM | POA: Diagnosis not present

## 2022-08-30 DIAGNOSIS — I442 Atrioventricular block, complete: Secondary | ICD-10-CM | POA: Diagnosis not present

## 2022-08-31 DIAGNOSIS — R278 Other lack of coordination: Secondary | ICD-10-CM | POA: Diagnosis not present

## 2022-08-31 DIAGNOSIS — S42201D Unspecified fracture of upper end of right humerus, subsequent encounter for fracture with routine healing: Secondary | ICD-10-CM | POA: Diagnosis not present

## 2022-08-31 DIAGNOSIS — M7022 Olecranon bursitis, left elbow: Secondary | ICD-10-CM | POA: Diagnosis not present

## 2022-08-31 DIAGNOSIS — R2689 Other abnormalities of gait and mobility: Secondary | ICD-10-CM | POA: Diagnosis not present

## 2022-08-31 DIAGNOSIS — Z950 Presence of cardiac pacemaker: Secondary | ICD-10-CM | POA: Diagnosis not present

## 2022-08-31 DIAGNOSIS — I442 Atrioventricular block, complete: Secondary | ICD-10-CM | POA: Diagnosis not present

## 2022-08-31 DIAGNOSIS — E1140 Type 2 diabetes mellitus with diabetic neuropathy, unspecified: Secondary | ICD-10-CM | POA: Diagnosis not present

## 2022-08-31 DIAGNOSIS — R2681 Unsteadiness on feet: Secondary | ICD-10-CM | POA: Diagnosis not present

## 2022-08-31 DIAGNOSIS — I495 Sick sinus syndrome: Secondary | ICD-10-CM | POA: Diagnosis not present

## 2022-08-31 DIAGNOSIS — Z9181 History of falling: Secondary | ICD-10-CM | POA: Diagnosis not present

## 2022-08-31 DIAGNOSIS — M6281 Muscle weakness (generalized): Secondary | ICD-10-CM | POA: Diagnosis not present

## 2022-08-31 DIAGNOSIS — S42402D Unspecified fracture of lower end of left humerus, subsequent encounter for fracture with routine healing: Secondary | ICD-10-CM | POA: Diagnosis not present

## 2022-08-31 DIAGNOSIS — D696 Thrombocytopenia, unspecified: Secondary | ICD-10-CM | POA: Diagnosis not present

## 2022-08-31 DIAGNOSIS — M19122 Post-traumatic osteoarthritis, left elbow: Secondary | ICD-10-CM | POA: Diagnosis not present

## 2022-08-31 DIAGNOSIS — R262 Difficulty in walking, not elsewhere classified: Secondary | ICD-10-CM | POA: Diagnosis not present

## 2022-09-01 DIAGNOSIS — R2689 Other abnormalities of gait and mobility: Secondary | ICD-10-CM | POA: Diagnosis not present

## 2022-09-01 DIAGNOSIS — R278 Other lack of coordination: Secondary | ICD-10-CM | POA: Diagnosis not present

## 2022-09-01 DIAGNOSIS — D696 Thrombocytopenia, unspecified: Secondary | ICD-10-CM | POA: Diagnosis not present

## 2022-09-01 DIAGNOSIS — M6281 Muscle weakness (generalized): Secondary | ICD-10-CM | POA: Diagnosis not present

## 2022-09-01 DIAGNOSIS — R2681 Unsteadiness on feet: Secondary | ICD-10-CM | POA: Diagnosis not present

## 2022-09-01 DIAGNOSIS — Z950 Presence of cardiac pacemaker: Secondary | ICD-10-CM | POA: Diagnosis not present

## 2022-09-01 DIAGNOSIS — I495 Sick sinus syndrome: Secondary | ICD-10-CM | POA: Diagnosis not present

## 2022-09-01 DIAGNOSIS — I442 Atrioventricular block, complete: Secondary | ICD-10-CM | POA: Diagnosis not present

## 2022-09-01 DIAGNOSIS — M19122 Post-traumatic osteoarthritis, left elbow: Secondary | ICD-10-CM | POA: Diagnosis not present

## 2022-09-01 DIAGNOSIS — S42402D Unspecified fracture of lower end of left humerus, subsequent encounter for fracture with routine healing: Secondary | ICD-10-CM | POA: Diagnosis not present

## 2022-09-01 DIAGNOSIS — M7022 Olecranon bursitis, left elbow: Secondary | ICD-10-CM | POA: Diagnosis not present

## 2022-09-01 DIAGNOSIS — E1140 Type 2 diabetes mellitus with diabetic neuropathy, unspecified: Secondary | ICD-10-CM | POA: Diagnosis not present

## 2022-09-01 DIAGNOSIS — S42201D Unspecified fracture of upper end of right humerus, subsequent encounter for fracture with routine healing: Secondary | ICD-10-CM | POA: Diagnosis not present

## 2022-09-01 DIAGNOSIS — R262 Difficulty in walking, not elsewhere classified: Secondary | ICD-10-CM | POA: Diagnosis not present

## 2022-09-01 DIAGNOSIS — Z9181 History of falling: Secondary | ICD-10-CM | POA: Diagnosis not present

## 2022-09-02 DIAGNOSIS — D696 Thrombocytopenia, unspecified: Secondary | ICD-10-CM | POA: Diagnosis not present

## 2022-09-02 DIAGNOSIS — S42201D Unspecified fracture of upper end of right humerus, subsequent encounter for fracture with routine healing: Secondary | ICD-10-CM | POA: Diagnosis not present

## 2022-09-02 DIAGNOSIS — Z950 Presence of cardiac pacemaker: Secondary | ICD-10-CM | POA: Diagnosis not present

## 2022-09-02 DIAGNOSIS — E1140 Type 2 diabetes mellitus with diabetic neuropathy, unspecified: Secondary | ICD-10-CM | POA: Diagnosis not present

## 2022-09-02 DIAGNOSIS — R2689 Other abnormalities of gait and mobility: Secondary | ICD-10-CM | POA: Diagnosis not present

## 2022-09-02 DIAGNOSIS — R2681 Unsteadiness on feet: Secondary | ICD-10-CM | POA: Diagnosis not present

## 2022-09-02 DIAGNOSIS — M7022 Olecranon bursitis, left elbow: Secondary | ICD-10-CM | POA: Diagnosis not present

## 2022-09-02 DIAGNOSIS — R278 Other lack of coordination: Secondary | ICD-10-CM | POA: Diagnosis not present

## 2022-09-02 DIAGNOSIS — S42402D Unspecified fracture of lower end of left humerus, subsequent encounter for fracture with routine healing: Secondary | ICD-10-CM | POA: Diagnosis not present

## 2022-09-02 DIAGNOSIS — M6281 Muscle weakness (generalized): Secondary | ICD-10-CM | POA: Diagnosis not present

## 2022-09-02 DIAGNOSIS — I495 Sick sinus syndrome: Secondary | ICD-10-CM | POA: Diagnosis not present

## 2022-09-02 DIAGNOSIS — R262 Difficulty in walking, not elsewhere classified: Secondary | ICD-10-CM | POA: Diagnosis not present

## 2022-09-02 DIAGNOSIS — I442 Atrioventricular block, complete: Secondary | ICD-10-CM | POA: Diagnosis not present

## 2022-09-02 DIAGNOSIS — M19122 Post-traumatic osteoarthritis, left elbow: Secondary | ICD-10-CM | POA: Diagnosis not present

## 2022-09-02 DIAGNOSIS — Z9181 History of falling: Secondary | ICD-10-CM | POA: Diagnosis not present

## 2022-09-05 DIAGNOSIS — Z950 Presence of cardiac pacemaker: Secondary | ICD-10-CM | POA: Diagnosis not present

## 2022-09-05 DIAGNOSIS — R2681 Unsteadiness on feet: Secondary | ICD-10-CM | POA: Diagnosis not present

## 2022-09-05 DIAGNOSIS — M19122 Post-traumatic osteoarthritis, left elbow: Secondary | ICD-10-CM | POA: Diagnosis not present

## 2022-09-05 DIAGNOSIS — R278 Other lack of coordination: Secondary | ICD-10-CM | POA: Diagnosis not present

## 2022-09-05 DIAGNOSIS — E1140 Type 2 diabetes mellitus with diabetic neuropathy, unspecified: Secondary | ICD-10-CM | POA: Diagnosis not present

## 2022-09-05 DIAGNOSIS — R262 Difficulty in walking, not elsewhere classified: Secondary | ICD-10-CM | POA: Diagnosis not present

## 2022-09-05 DIAGNOSIS — Z9181 History of falling: Secondary | ICD-10-CM | POA: Diagnosis not present

## 2022-09-05 DIAGNOSIS — M7022 Olecranon bursitis, left elbow: Secondary | ICD-10-CM | POA: Diagnosis not present

## 2022-09-05 DIAGNOSIS — M6281 Muscle weakness (generalized): Secondary | ICD-10-CM | POA: Diagnosis not present

## 2022-09-05 DIAGNOSIS — R2689 Other abnormalities of gait and mobility: Secondary | ICD-10-CM | POA: Diagnosis not present

## 2022-09-05 DIAGNOSIS — I442 Atrioventricular block, complete: Secondary | ICD-10-CM | POA: Diagnosis not present

## 2022-09-05 DIAGNOSIS — I495 Sick sinus syndrome: Secondary | ICD-10-CM | POA: Diagnosis not present

## 2022-09-05 DIAGNOSIS — S42201D Unspecified fracture of upper end of right humerus, subsequent encounter for fracture with routine healing: Secondary | ICD-10-CM | POA: Diagnosis not present

## 2022-09-05 DIAGNOSIS — D696 Thrombocytopenia, unspecified: Secondary | ICD-10-CM | POA: Diagnosis not present

## 2022-09-05 DIAGNOSIS — S42402D Unspecified fracture of lower end of left humerus, subsequent encounter for fracture with routine healing: Secondary | ICD-10-CM | POA: Diagnosis not present

## 2022-09-06 DIAGNOSIS — R262 Difficulty in walking, not elsewhere classified: Secondary | ICD-10-CM | POA: Diagnosis not present

## 2022-09-06 DIAGNOSIS — R278 Other lack of coordination: Secondary | ICD-10-CM | POA: Diagnosis not present

## 2022-09-06 DIAGNOSIS — S42402D Unspecified fracture of lower end of left humerus, subsequent encounter for fracture with routine healing: Secondary | ICD-10-CM | POA: Diagnosis not present

## 2022-09-06 DIAGNOSIS — Z950 Presence of cardiac pacemaker: Secondary | ICD-10-CM | POA: Diagnosis not present

## 2022-09-06 DIAGNOSIS — M19122 Post-traumatic osteoarthritis, left elbow: Secondary | ICD-10-CM | POA: Diagnosis not present

## 2022-09-06 DIAGNOSIS — M6281 Muscle weakness (generalized): Secondary | ICD-10-CM | POA: Diagnosis not present

## 2022-09-06 DIAGNOSIS — M7022 Olecranon bursitis, left elbow: Secondary | ICD-10-CM | POA: Diagnosis not present

## 2022-09-06 DIAGNOSIS — I495 Sick sinus syndrome: Secondary | ICD-10-CM | POA: Diagnosis not present

## 2022-09-06 DIAGNOSIS — Z9181 History of falling: Secondary | ICD-10-CM | POA: Diagnosis not present

## 2022-09-06 DIAGNOSIS — R2681 Unsteadiness on feet: Secondary | ICD-10-CM | POA: Diagnosis not present

## 2022-09-06 DIAGNOSIS — D696 Thrombocytopenia, unspecified: Secondary | ICD-10-CM | POA: Diagnosis not present

## 2022-09-06 DIAGNOSIS — E1140 Type 2 diabetes mellitus with diabetic neuropathy, unspecified: Secondary | ICD-10-CM | POA: Diagnosis not present

## 2022-09-06 DIAGNOSIS — I442 Atrioventricular block, complete: Secondary | ICD-10-CM | POA: Diagnosis not present

## 2022-09-06 DIAGNOSIS — S42201D Unspecified fracture of upper end of right humerus, subsequent encounter for fracture with routine healing: Secondary | ICD-10-CM | POA: Diagnosis not present

## 2022-09-06 DIAGNOSIS — R2689 Other abnormalities of gait and mobility: Secondary | ICD-10-CM | POA: Diagnosis not present

## 2022-09-07 DIAGNOSIS — E1140 Type 2 diabetes mellitus with diabetic neuropathy, unspecified: Secondary | ICD-10-CM | POA: Diagnosis not present

## 2022-09-07 DIAGNOSIS — R262 Difficulty in walking, not elsewhere classified: Secondary | ICD-10-CM | POA: Diagnosis not present

## 2022-09-07 DIAGNOSIS — S42201D Unspecified fracture of upper end of right humerus, subsequent encounter for fracture with routine healing: Secondary | ICD-10-CM | POA: Diagnosis not present

## 2022-09-07 DIAGNOSIS — Z950 Presence of cardiac pacemaker: Secondary | ICD-10-CM | POA: Diagnosis not present

## 2022-09-07 DIAGNOSIS — I442 Atrioventricular block, complete: Secondary | ICD-10-CM | POA: Diagnosis not present

## 2022-09-07 DIAGNOSIS — R278 Other lack of coordination: Secondary | ICD-10-CM | POA: Diagnosis not present

## 2022-09-07 DIAGNOSIS — R2689 Other abnormalities of gait and mobility: Secondary | ICD-10-CM | POA: Diagnosis not present

## 2022-09-07 DIAGNOSIS — M6281 Muscle weakness (generalized): Secondary | ICD-10-CM | POA: Diagnosis not present

## 2022-09-07 DIAGNOSIS — Z9181 History of falling: Secondary | ICD-10-CM | POA: Diagnosis not present

## 2022-09-07 DIAGNOSIS — S42402D Unspecified fracture of lower end of left humerus, subsequent encounter for fracture with routine healing: Secondary | ICD-10-CM | POA: Diagnosis not present

## 2022-09-07 DIAGNOSIS — M19122 Post-traumatic osteoarthritis, left elbow: Secondary | ICD-10-CM | POA: Diagnosis not present

## 2022-09-07 DIAGNOSIS — R2681 Unsteadiness on feet: Secondary | ICD-10-CM | POA: Diagnosis not present

## 2022-09-07 DIAGNOSIS — D696 Thrombocytopenia, unspecified: Secondary | ICD-10-CM | POA: Diagnosis not present

## 2022-09-07 DIAGNOSIS — M7022 Olecranon bursitis, left elbow: Secondary | ICD-10-CM | POA: Diagnosis not present

## 2022-09-07 DIAGNOSIS — I495 Sick sinus syndrome: Secondary | ICD-10-CM | POA: Diagnosis not present

## 2022-09-10 DIAGNOSIS — Z950 Presence of cardiac pacemaker: Secondary | ICD-10-CM | POA: Diagnosis not present

## 2022-09-10 DIAGNOSIS — E1140 Type 2 diabetes mellitus with diabetic neuropathy, unspecified: Secondary | ICD-10-CM | POA: Diagnosis not present

## 2022-09-10 DIAGNOSIS — R278 Other lack of coordination: Secondary | ICD-10-CM | POA: Diagnosis not present

## 2022-09-10 DIAGNOSIS — I495 Sick sinus syndrome: Secondary | ICD-10-CM | POA: Diagnosis not present

## 2022-09-10 DIAGNOSIS — M6281 Muscle weakness (generalized): Secondary | ICD-10-CM | POA: Diagnosis not present

## 2022-09-10 DIAGNOSIS — M7022 Olecranon bursitis, left elbow: Secondary | ICD-10-CM | POA: Diagnosis not present

## 2022-09-10 DIAGNOSIS — R262 Difficulty in walking, not elsewhere classified: Secondary | ICD-10-CM | POA: Diagnosis not present

## 2022-09-10 DIAGNOSIS — M19122 Post-traumatic osteoarthritis, left elbow: Secondary | ICD-10-CM | POA: Diagnosis not present

## 2022-09-10 DIAGNOSIS — S42201D Unspecified fracture of upper end of right humerus, subsequent encounter for fracture with routine healing: Secondary | ICD-10-CM | POA: Diagnosis not present

## 2022-09-10 DIAGNOSIS — S42402D Unspecified fracture of lower end of left humerus, subsequent encounter for fracture with routine healing: Secondary | ICD-10-CM | POA: Diagnosis not present

## 2022-09-10 DIAGNOSIS — R2689 Other abnormalities of gait and mobility: Secondary | ICD-10-CM | POA: Diagnosis not present

## 2022-09-10 DIAGNOSIS — R2681 Unsteadiness on feet: Secondary | ICD-10-CM | POA: Diagnosis not present

## 2022-09-10 DIAGNOSIS — Z9181 History of falling: Secondary | ICD-10-CM | POA: Diagnosis not present

## 2022-09-10 DIAGNOSIS — D696 Thrombocytopenia, unspecified: Secondary | ICD-10-CM | POA: Diagnosis not present

## 2022-09-10 DIAGNOSIS — I442 Atrioventricular block, complete: Secondary | ICD-10-CM | POA: Diagnosis not present

## 2022-09-12 ENCOUNTER — Ambulatory Visit: Payer: Medicare Other

## 2022-09-12 DIAGNOSIS — I495 Sick sinus syndrome: Secondary | ICD-10-CM

## 2022-09-12 DIAGNOSIS — I442 Atrioventricular block, complete: Secondary | ICD-10-CM

## 2022-09-13 DIAGNOSIS — D696 Thrombocytopenia, unspecified: Secondary | ICD-10-CM | POA: Diagnosis not present

## 2022-09-13 DIAGNOSIS — M19122 Post-traumatic osteoarthritis, left elbow: Secondary | ICD-10-CM | POA: Diagnosis not present

## 2022-09-13 DIAGNOSIS — R262 Difficulty in walking, not elsewhere classified: Secondary | ICD-10-CM | POA: Diagnosis not present

## 2022-09-13 DIAGNOSIS — R2681 Unsteadiness on feet: Secondary | ICD-10-CM | POA: Diagnosis not present

## 2022-09-13 DIAGNOSIS — E1140 Type 2 diabetes mellitus with diabetic neuropathy, unspecified: Secondary | ICD-10-CM | POA: Diagnosis not present

## 2022-09-13 DIAGNOSIS — I442 Atrioventricular block, complete: Secondary | ICD-10-CM | POA: Diagnosis not present

## 2022-09-13 DIAGNOSIS — Z950 Presence of cardiac pacemaker: Secondary | ICD-10-CM | POA: Diagnosis not present

## 2022-09-13 DIAGNOSIS — M7022 Olecranon bursitis, left elbow: Secondary | ICD-10-CM | POA: Diagnosis not present

## 2022-09-13 DIAGNOSIS — S42402D Unspecified fracture of lower end of left humerus, subsequent encounter for fracture with routine healing: Secondary | ICD-10-CM | POA: Diagnosis not present

## 2022-09-13 DIAGNOSIS — S42201D Unspecified fracture of upper end of right humerus, subsequent encounter for fracture with routine healing: Secondary | ICD-10-CM | POA: Diagnosis not present

## 2022-09-13 DIAGNOSIS — M6281 Muscle weakness (generalized): Secondary | ICD-10-CM | POA: Diagnosis not present

## 2022-09-13 DIAGNOSIS — R278 Other lack of coordination: Secondary | ICD-10-CM | POA: Diagnosis not present

## 2022-09-13 DIAGNOSIS — R2689 Other abnormalities of gait and mobility: Secondary | ICD-10-CM | POA: Diagnosis not present

## 2022-09-13 DIAGNOSIS — Z9181 History of falling: Secondary | ICD-10-CM | POA: Diagnosis not present

## 2022-09-13 DIAGNOSIS — I495 Sick sinus syndrome: Secondary | ICD-10-CM | POA: Diagnosis not present

## 2022-09-13 LAB — CUP PACEART REMOTE DEVICE CHECK
Battery Remaining Longevity: 114 mo
Battery Remaining Percentage: 100 %
Brady Statistic RA Percent Paced: 17 %
Brady Statistic RV Percent Paced: 93 %
Date Time Interrogation Session: 20240909000100
Implantable Lead Connection Status: 753985
Implantable Lead Connection Status: 753985
Implantable Lead Implant Date: 20230831
Implantable Lead Implant Date: 20230831
Implantable Lead Location: 753859
Implantable Lead Location: 753860
Implantable Lead Model: 7841
Implantable Lead Model: 7842
Implantable Lead Serial Number: 1220926
Implantable Lead Serial Number: 1322603
Implantable Pulse Generator Implant Date: 20230831
Lead Channel Impedance Value: 496 Ohm
Lead Channel Impedance Value: 528 Ohm
Lead Channel Pacing Threshold Amplitude: 0.5 V
Lead Channel Pacing Threshold Amplitude: 1.2 V
Lead Channel Pacing Threshold Pulse Width: 0.4 ms
Lead Channel Pacing Threshold Pulse Width: 0.4 ms
Lead Channel Setting Pacing Amplitude: 3.5 V
Lead Channel Setting Pacing Amplitude: 3.5 V
Lead Channel Setting Pacing Pulse Width: 0.4 ms
Lead Channel Setting Sensing Sensitivity: 3.5 mV
Pulse Gen Serial Number: 613820
Zone Setting Status: 755011

## 2022-09-19 DIAGNOSIS — M1711 Unilateral primary osteoarthritis, right knee: Secondary | ICD-10-CM | POA: Diagnosis not present

## 2022-09-19 DIAGNOSIS — G8929 Other chronic pain: Secondary | ICD-10-CM | POA: Diagnosis not present

## 2022-09-19 DIAGNOSIS — M25561 Pain in right knee: Secondary | ICD-10-CM | POA: Diagnosis not present

## 2022-09-27 ENCOUNTER — Encounter: Payer: Self-pay | Admitting: Nurse Practitioner

## 2022-09-27 ENCOUNTER — Non-Acute Institutional Stay (SKILLED_NURSING_FACILITY): Payer: Medicare Other | Admitting: Nurse Practitioner

## 2022-09-27 DIAGNOSIS — L539 Erythematous condition, unspecified: Secondary | ICD-10-CM

## 2022-09-27 NOTE — Progress Notes (Unsigned)
Location:  Other [facility].  Nursing Home Room Number: [facility] [address] Place of Service:  SNF [phone]) [name], NP  PCP: [name], MD  Patient Care Team: [name], MD as PCP - General (Family Medicine) [name], MD as PCP - Cardiology (Cardiology) [name], MD as Consulting Physician (Cardiology)  Extended Emergency Contact Information Primary Emergency Contact: [name] [facility]) Address: [address]          [place], [place] [address] [name] of [employer] Phone: [phone] Relation: Son  Goals of care: Advanced Directive information    09/27/2022   12:02 PM  Advanced Directives  Does Patient Have a Medical Advance Directive? Yes  Type of Estate agent of [place];Out of facility DNR (pink MOST or yellow form);Living will  Does patient want to make changes to medical advance directive? No - Patient declined  Copy of Healthcare Power of Attorney in Chart? Yes - validated most recent copy scanned in chart (See row information)     Chief Complaint  Patient presents with   Acute Visit    Right Area on Left Shin.     HPI:  Pt is a 89 y.o. male seen today for an acute visit for right area on left shin.  Staff has noticed patient itching area.  Has had breakdown in the past but has healed.  Nursing staff applied iodine over the weekend No pain or discomfort.    Past Medical History:  Diagnosis Date   Asthma    Atrial flutter (HCC) 2007   Band keratopathy    Benign prostatic hypertrophy    Chronic ear infection    Chronic kidney disease    Chronic prostatitis    Dental bridge present    permanent - upper   Diabetes mellitus    Elbow stiffness, left    s/p fracture many yrs ago.  arm does not straighten   GERD (gastroesophageal reflux disease)    laryngeal involvement   Hyperlipidemia    Hypertension    Obstructive sleep apnea    CPAP-9   Osteoarthrosis, localized, primary, knee     post-traumatic   Prostatitis    Stroke (HCC)    TIA   Tachy-brady syndrome (HCC)    UTI (urinary tract infection)    Past Surgical History:  Procedure Laterality Date   APPENDECTOMY     BELPHAROPTOSIS REPAIR     Dr [name]---didn't resolve weepy eye and eyelid drooping   CARDIOVERSION  04/13/2010   CATARACT EXTRACTION, BILATERAL  2009   Chest pain  8/12   Stress test benign   ESOPHAGEAL DILATION  05/26/2016   Procedure: ESOPHAGEAL DILATION;  Surgeon: [name], MD;  Location: [facility];  Service: Endoscopy;;   ESOPHAGOGASTRODUODENOSCOPY (EGD) WITH PROPOFOL N/A 05/26/2016   Procedure: ESOPHAGOGASTRODUODENOSCOPY (EGD) WITH PROPOFOL;  Surgeon: [name], MD;  Location: [facility];  Service: Endoscopy;  Laterality: N/A;  Diabetic - oral meds sleep apnea   EYE SURGERY     FRACTURE SURGERY Left    elbow   KNEE SURGERY  1998   plate after fracture, then removed for infection   left elbow surgery     MASTOIDECTOMY  8/08   Dr [name]   PACEMAKER IMPLANT N/A 09/02/2021   Procedure: PACEMAKER IMPLANT;  Surgeon: [name], MD;  Location: [facility] INVASIVE CV [facility];  Service: Cardiovascular;  Laterality: N/A;   RHINOPLASTY  5/10   and septoplasty   SUBACROMIAL DECOMPRESSION Right 2005  Arthroscopic (for rotator cuff and biceps tendon ruptures)   TEAR DUCT PROBING  11/13   Dr [name]   TOTAL KNEE ARTHROPLASTY Left 09/01/2014   Procedure: TOTAL KNEE ARTHROPLASTY;  Surgeon: [name], MD;  Location: [facility];  Service: Orthopedics;  Laterality: Left;   TRIGGER FINGER RELEASE Left 02/25/2015   Procedure: LEFT LONG TRIGGER RELEASE;  Surgeon: [name], MD;  Location: [facility];  Service: Orthopedics;  Laterality: Left;   VENOUS ABLATION      Allergies  Allergen Reactions   Lovenox [Enoxaparin] Hives   Proscar [Finasteride] Other (See Comments)    Upper body and arm weakness   Latex Rash    Has trouble with BANDAIDS that have been left on for more than  24 hours. Prefers paper tape.   Nickel Rash    Localized rash   Percocet [Oxycodone-Acetaminophen] Rash   Tape Rash and Other (See Comments)    Sensitivity     Outpatient Encounter Medications as of 09/27/2022  Medication Sig   acetaminophen (TYLENOL) 500 MG tablet Take 1,000 mg by mouth every 8 (eight) hours as needed.   apixaban (ELIQUIS) 2.5 MG TABS tablet Take 2.5 mg by mouth 2 (two) times daily.   APPLE CIDER VINEGAR PO Take 1 capsule by mouth at bedtime.   atorvastatin (LIPITOR) 80 MG tablet Take 1 tablet (80 mg total) by mouth daily.   Cranberry 250 MG TABS Take 1 tablet by mouth 2 (two) times daily.   Cyanocobalamin (B-12 PO) Take 1,000 mg by mouth daily.   finasteride (PROSCAR) 5 MG tablet Take 1 tablet (5 mg total) by mouth daily.   guaiFENesin (TUSSIN PO) Take 10 mLs by mouth every 4 (four) hours as needed.   lactose free nutrition (BOOST) LIQD Take 237 mLs by mouth 3 (three) times daily between meals.   lidocaine (LIDODERM) 5 % Place 1 patch onto the skin daily. Remove & Discard patch within 12 hours or as directed by MD   Magnesium Oxide 400 MG CAPS Take 1 capsule (400 mg total) by mouth daily.   metFORMIN (GLUCOPHAGE) 500 MG tablet TAKE 1 TABLET BY MOUTH  TWICE DAILY WITH MEALS   Polyethyl Glycol-Propyl Glycol (SYSTANE ULTRA OP) Place 1 drop into both eyes daily as needed (dry eyes).   polyethylene glycol (MIRALAX / GLYCOLAX) 17 g packet Take 17 g by mouth. Daily every Monday, Wednesday and Friday.   sertraline (ZOLOFT) 50 MG tablet Take 50 mg by mouth daily.   sodium chloride (OCEAN) 0.65 % SOLN nasal spray Place 2 sprays into both nostrils 2 (two) times daily as needed for congestion.   Sodium Fluoride (PREVIDENT 5000 BOOSTER PLUS) 1.1 % PSTE Place 1 application  onto teeth at bedtime as needed.   tamsulosin (FLOMAX) 0.4 MG CAPS capsule Take 2 capsules (0.8 mg total) by mouth at bedtime.   Wheat Dextrin (BENEFIBER) POWD Give 2 tsp by mouth in the morning for Bowel  Regulation mix in 8oz liquid of resident's choice   Zinc Oxide (TRIPLE PASTE) 12.8 % ointment Apply 1 Application topically. Every shift to bilateral buttocks.   [DISCONTINUED] silver sulfADIAZINE (SILVADENE) 1 % cream Apply 1 Application topically daily.   No facility-administered encounter medications on file as of 09/27/2022.    Review of Systems  Skin:  Positive for color change. Negative for rash and wound.    Immunization History  Administered Date(s) Administered   Fluad Quad(high Dose 65+) 09/14/2018   H1N1 12/13/2007   Influenza Split 09/11/2010  Influenza Whole 09/12/2011   Influenza, High Dose Seasonal PF 09/20/2012, 09/07/2017   Influenza, Seasonal, Injecte, Preservative Fre 09/08/2007, 09/26/2008, 10/02/2014, 09/18/2015   Influenza,inj,Quad PF,6+ Mos 09/19/2013, 09/21/2015   Influenza-Unspecified 09/07/2017, 09/11/2019, 09/03/2020, 10/19/2021   Moderna Sars-Covid-2 Vaccination 01/18/2019, 02/15/2019, 11/19/2019, 10/17/2020, 06/01/2021   PNEUMOCOCCAL CONJUGATE-20 12/13/2021   Pneumococcal Conjugate-13 09/19/2013   Pneumococcal Polysaccharide-23 09/26/2003, 06/28/2016   Td 08/04/1998   Tdap 06/04/2010, 08/15/2021   Tetanus 08/04/1998   Unspecified SARS-COV-2 Vaccination 06/01/2021   Zoster Recombinant(Shingrix) 06/29/2022   Zoster, Live 01/16/2004   Pertinent  Health Maintenance Due  Topic Date Due   INFLUENZA VACCINE  08/04/2022   FOOT EXAM  08/20/2022   HEMOGLOBIN A1C  11/05/2022   OPHTHALMOLOGY EXAM  06/01/2023      12/05/2021   10:38 PM 12/06/2021    7:05 AM 12/06/2021    7:40 PM 12/07/2021    8:45 AM 06/28/2022    2:03 PM  Fall Risk  Falls in the past year?     1  Was there an injury with Fall?     1  Fall Risk Category Calculator     3  (RETIRED) Patient Fall Risk Level Moderate fall risk High fall risk High fall risk High fall risk   Patient at Risk for Falls Due to     History of fall(s);Impaired balance/gait;Impaired mobility;Impaired vision    Functional Status Survey:    Vitals:   09/27/22 1150  BP: 97/61  Pulse: 73  Resp: 16  Temp: 98.3 F (36.8 C)  SpO2: 94%  Weight: 163 lb 6.4 oz (74.1 kg)  Height: 5\' 10"  (1.778 m)   Body mass index is 23.45 kg/m. Physical Exam Constitutional:      Appearance: Normal appearance.  Skin:    Findings: Bruising and erythema (noted to left shin, no warmth, mild tenderness) present.  Neurological:     Mental Status: He is alert. Mental status is at baseline.  Psychiatric:        Mood and Affect: Mood normal.     Labs reviewed: Recent Labs    12/04/21 0500 12/06/21 0348 02/08/22 1438 02/14/22 0000 05/12/22 0000  NA 136 132* 134* 138 138  K 3.6 3.7 3.9 3.5 3.9  CL 105 103 96* 103 103  CO2 25 22 24  31* 30*  GLUCOSE 164* 166* 281*  --   --   BUN 25* 26* 50* 37* 26*  CREATININE 0.79 0.79 1.20 1.0 0.7  CALCIUM 9.8 9.8 10.0 9.8 10.3   Recent Labs    02/08/22 1438 05/12/22 0000  AST 42* 13*  ALT 25 12  ALKPHOS 77 55  BILITOT 1.0  --   PROT 6.6  --   ALBUMIN 2.9* 3.1*   Recent Labs    12/06/21 0348 12/07/21 0623 02/08/22 1438 02/08/22 1438 02/14/22 0000 02/20/22 0000 02/24/22 0000 03/17/22 0000 03/24/22 0000 05/12/22 0000  WBC 11.0* 10.6* 11.9*   < > 5.4 6.4 6.9 8.4 9.3 6.9  NEUTROABS  --   --   --   --  3,440.00 4,736.00 4,720.00  --   --   --   HGB 10.4* 10.7* 12.5*  --  11.5* 10.7* 10.7* 9.5* 10.0* 10.6*  HCT 30.8* 30.9* 39.0  --  34* 32* 33* 29* 30* 32*  MCV 93.1 93.1 94.2  --   --   --   --   --   --   --   PLT 138* 139* 189  --  110* 143* 185  152 176 144*   < > = values in this interval not displayed.   Lab Results  Component Value Date   TSH 0.76 12/11/2012   Lab Results  Component Value Date   HGBA1C 7.1 05/05/2022   Lab Results  Component Value Date   CHOL 99 05/05/2022   HDL 48 05/05/2022   LDLCALC 37 05/05/2022   TRIG 48 05/05/2022   CHOLHDL 2 02/25/2021    Significant Diagnostic Results in last 30 days:  CUP PACEART REMOTE  DEVICE CHECK  Result Date: 09/13/2022 Scheduled remote reviewed. Normal device function.  1 NSVT, 23 beats in duration Next remote 91 days. LA, CVRS   Assessment/Plan 1. Erythema Mild redness noted to shin, No signs of wounds, breakdown or infection  instructed nursing to wrap to protect area and avoid scratching or hitting. To apply aquarphor only Dc all other ointments.  Monitor for worsening redness and notify.    Janene Harvey. Biagio Borg [facility] [phone]

## 2022-09-29 NOTE — Progress Notes (Signed)
Remote pacemaker transmission.   

## 2022-10-04 DIAGNOSIS — H90A22 Sensorineural hearing loss, unilateral, left ear, with restricted hearing on the contralateral side: Secondary | ICD-10-CM | POA: Diagnosis not present

## 2022-10-04 DIAGNOSIS — H90A31 Mixed conductive and sensorineural hearing loss, unilateral, right ear with restricted hearing on the contralateral side: Secondary | ICD-10-CM | POA: Diagnosis not present

## 2022-10-04 DIAGNOSIS — H9313 Tinnitus, bilateral: Secondary | ICD-10-CM | POA: Diagnosis not present

## 2022-10-10 ENCOUNTER — Encounter: Payer: Self-pay | Admitting: Student

## 2022-10-10 ENCOUNTER — Non-Acute Institutional Stay (SKILLED_NURSING_FACILITY): Payer: Medicare Other | Admitting: Student

## 2022-10-10 DIAGNOSIS — E1140 Type 2 diabetes mellitus with diabetic neuropathy, unspecified: Secondary | ICD-10-CM

## 2022-10-10 DIAGNOSIS — I700 Atherosclerosis of aorta: Secondary | ICD-10-CM | POA: Diagnosis not present

## 2022-10-10 DIAGNOSIS — N138 Other obstructive and reflux uropathy: Secondary | ICD-10-CM | POA: Diagnosis not present

## 2022-10-10 DIAGNOSIS — I482 Chronic atrial fibrillation, unspecified: Secondary | ICD-10-CM

## 2022-10-10 DIAGNOSIS — I5032 Chronic diastolic (congestive) heart failure: Secondary | ICD-10-CM | POA: Diagnosis not present

## 2022-10-10 DIAGNOSIS — F324 Major depressive disorder, single episode, in partial remission: Secondary | ICD-10-CM

## 2022-10-10 DIAGNOSIS — N401 Enlarged prostate with lower urinary tract symptoms: Secondary | ICD-10-CM

## 2022-10-10 DIAGNOSIS — E440 Moderate protein-calorie malnutrition: Secondary | ICD-10-CM | POA: Diagnosis not present

## 2022-10-10 DIAGNOSIS — I4892 Unspecified atrial flutter: Secondary | ICD-10-CM | POA: Diagnosis not present

## 2022-10-10 NOTE — Progress Notes (Signed)
Location:  Other [place].  Nursing Home Room Number: [facility] [address] Place of Service:  SNF [phone]) Provider:  [name], MD  Patient Care Team: [name], MD as PCP - General (Family Medicine) Antonieta Iba, MD as PCP - Cardiology (Cardiology) Antonieta Iba, MD as Consulting Physician (Cardiology)  Extended Emergency Contact Information Primary Emergency Contact: [name] [facility]) Address: [address]          [place], [place] [address] [name] of [employer] Phone: [phone] Relation: Son  Code Status:  DNR Goals of care: Advanced Directive information    10/10/2022   10:12 AM  Advanced Directives  Does Patient Have a Medical Advance Directive? Yes  Type of Estate agent of [place];Out of facility DNR (pink MOST or yellow form);Living will  Does patient want to make changes to medical advance directive? No - Patient declined  Copy of Healthcare Power of Attorney in Chart? Yes - validated most recent copy scanned in chart (See row information)     Chief Complaint  Patient presents with   Medical Management of Chronic Issues    Medical Management of Chronic Issues.     HPI:  Pt is a 89 y.o. male seen today for medical management of chronic diseases.    Patient is doing well he has a birthday coming up but he is excited for.  His son is planning a birthday party for him.  He is not having any issues with bowel movements or urination.  His appetite has been good.  Denies any issues with pain.  He states that he has been reading a few different books about religion which are interesting to him.  Denies chest pain, shortness of breath.  Denies leg swelling.  Nursing without concerns at this time  Past Medical History:  Diagnosis Date   Asthma    Atrial flutter (HCC) 2007   Band keratopathy    Benign prostatic hypertrophy    Chronic ear infection    Chronic kidney disease    Chronic prostatitis    Dental  bridge present    permanent - upper   Diabetes mellitus    Elbow stiffness, left    s/p fracture many yrs ago.  arm does not straighten   GERD (gastroesophageal reflux disease)    laryngeal involvement   Hyperlipidemia    Hypertension    Obstructive sleep apnea    CPAP-9   Osteoarthrosis, localized, primary, knee    post-traumatic   Prostatitis    Stroke (HCC)    TIA   Tachy-brady syndrome (HCC)    UTI (urinary tract infection)    Past Surgical History:  Procedure Laterality Date   APPENDECTOMY     BELPHAROPTOSIS REPAIR     Dr [name]---didn't resolve weepy eye and eyelid drooping   CARDIOVERSION  04/13/2010   CATARACT EXTRACTION, BILATERAL  2009   Chest pain  8/12   Stress test benign   ESOPHAGEAL DILATION  05/26/2016   Procedure: ESOPHAGEAL DILATION;  Surgeon: [name], MD;  Location: [facility];  Service: Endoscopy;;   ESOPHAGOGASTRODUODENOSCOPY (EGD) WITH PROPOFOL N/A 05/26/2016   Procedure: ESOPHAGOGASTRODUODENOSCOPY (EGD) WITH PROPOFOL;  Surgeon: [name], MD;  Location: [facility];  Service: Endoscopy;  Laterality: N/A;  Diabetic - oral meds sleep apnea   EYE SURGERY     FRACTURE SURGERY Left    elbow   KNEE SURGERY  1998   plate after fracture, then removed for infection   left  elbow surgery     MASTOIDECTOMY  8/08   Dr [name]   PACEMAKER IMPLANT N/A 09/02/2021   Procedure: PACEMAKER IMPLANT;  Surgeon: [name], MD;  Location: [facility] INVASIVE CV LAB;  Service: Cardiovascular;  Laterality: N/A;   RHINOPLASTY  5/10   and septoplasty   SUBACROMIAL DECOMPRESSION Right 2005   Arthroscopic (for rotator cuff and biceps tendon ruptures)   TEAR DUCT PROBING  11/13   Dr [name]   TOTAL KNEE ARTHROPLASTY Left 09/01/2014   Procedure: TOTAL KNEE ARTHROPLASTY;  Surgeon: [name], MD;  Location: [facility] ORS;  Service: Orthopedics;  Laterality: Left;   TRIGGER FINGER RELEASE Left 02/25/2015   Procedure: LEFT LONG TRIGGER RELEASE;  Surgeon:  [name], MD;  Location: [facility] OR[facility];  Service: Orthopedics;  Laterality: Left;   VENOUS ABLATION      Allergies  Allergen Reactions   Lovenox [Enoxaparin] Hives   Proscar [Finasteride] Other (See Comments)    Upper body and arm weakness   Latex Rash    Has trouble with BANDAIDS that have been left on for more than 24 hours. Prefers paper tape.   Nickel Rash    Localized rash   Percocet [Oxycodone-Acetaminophen] Rash   Tape Rash and Other (See Comments)    Sensitivity     Outpatient Encounter Medications as of 10/10/2022  Medication Sig   acetaminophen (TYLENOL) 500 MG tablet Take 1,000 mg by mouth every 8 (eight) hours as needed.   apixaban (ELIQUIS) 2.5 MG TABS tablet Take 2.5 mg by mouth 2 (two) times daily.   APPLE CIDER VINEGAR PO Take 1 capsule by mouth at bedtime.   atorvastatin (LIPITOR) 80 MG tablet Take 1 tablet (80 mg total) by mouth daily.   Cranberry 250 MG TABS Take 1 tablet by mouth 2 (two) times daily.   Cyanocobalamin (B-12 PO) Take 1,000 mg by mouth daily.   finasteride (PROSCAR) 5 MG tablet Take 1 tablet (5 mg total) by mouth daily.   guaiFENesin (MUCINEX) 600 MG 12 hr tablet Take 600 mg by mouth every 8 (eight) hours as needed.   guaiFENesin (TUSSIN PO) Take 10 mLs by mouth every 4 (four) hours as needed.   lactose free nutrition (BOOST) LIQD Take 237 mLs by mouth 3 (three) times daily between meals.   lidocaine (LIDODERM) 5 % Place 1 patch onto the skin daily. Remove & Discard patch within 12 hours or as directed by MD   Magnesium Oxide 400 MG CAPS Take 1 capsule (400 mg total) by mouth daily.   metFORMIN (GLUCOPHAGE) 500 MG tablet TAKE 1 TABLET BY MOUTH  TWICE DAILY WITH MEALS   mineral oil-hydrophilic petrolatum (AQUAPHOR) ointment Apply 1 Application topically daily as needed for dry skin. To left lower leg   Polyethyl Glycol-Propyl Glycol (SYSTANE ULTRA OP) Place 1 drop into both eyes daily as needed (dry eyes).   polyethylene glycol (MIRALAX / GLYCOLAX)  17 g packet Take 17 g by mouth. Daily every Monday, Wednesday and Friday.   sertraline (ZOLOFT) 50 MG tablet Take 50 mg by mouth daily.   sodium chloride (OCEAN) 0.65 % SOLN nasal spray Place 2 sprays into both nostrils 2 (two) times daily as needed for congestion.   Sodium Fluoride (PREVIDENT 5000 BOOSTER PLUS) 1.1 % PSTE Place 1 application  onto teeth at bedtime as needed.   tamsulosin (FLOMAX) 0.4 MG CAPS capsule Take 2 capsules (0.8 mg total) by mouth at bedtime.   Wheat Dextrin (BENEFIBER) POWD Give 2 tsp by  mouth in the morning for Bowel Regulation mix in 8oz liquid of resident's choice   Zinc Oxide (TRIPLE PASTE) 12.8 % ointment Apply 1 Application topically. Every shift to bilateral buttocks.   No facility-administered encounter medications on file as of 10/10/2022.    Review of Systems  Immunization History  Administered Date(s) Administered   Fluad Quad(high Dose 65+) 09/14/2018   H1N1 12/13/2007   Influenza Split 09/11/2010   Influenza Whole 09/12/2011   Influenza, High Dose Seasonal PF 09/20/2012, 09/07/2017   Influenza, Seasonal, Injecte, Preservative Fre 09/08/2007, 09/26/2008, 10/02/2014, 09/18/2015   Influenza,inj,Quad PF,6+ Mos 09/19/2013, 09/21/2015   Influenza-Unspecified 09/07/2017, 09/11/2019, 09/03/2020, 10/19/2021   Moderna Sars-Covid-2 Vaccination 01/18/2019, 02/15/2019, 11/19/2019, 10/17/2020, 06/01/2021   PNEUMOCOCCAL CONJUGATE-20 12/13/2021   Pneumococcal Conjugate-13 09/19/2013   Pneumococcal Polysaccharide-23 09/26/2003, 06/28/2016   Td 08/04/1998   Tdap 06/04/2010, 08/15/2021   Tetanus 08/04/1998   Unspecified SARS-COV-2 Vaccination 06/01/2021, 09/30/2022   Zoster Recombinant(Shingrix) 06/29/2022   Zoster, Live 01/16/2004   Pertinent  Health Maintenance Due  Topic Date Due   INFLUENZA VACCINE  08/04/2022   FOOT EXAM  08/20/2022   HEMOGLOBIN A1C  11/05/2022   OPHTHALMOLOGY EXAM  06/01/2023      12/05/2021   10:38 PM 12/06/2021    7:05 AM  12/06/2021    7:40 PM 12/07/2021    8:45 AM 06/28/2022    2:03 PM  Fall Risk  Falls in the past year?     1  Was there an injury with Fall?     1  Fall Risk Category Calculator     3  (RETIRED) Patient Fall Risk Level Moderate fall risk High fall risk High fall risk High fall risk   Patient at Risk for Falls Due to     History of fall(s);Impaired balance/gait;Impaired mobility;Impaired vision   Functional Status Survey:    Vitals:   10/10/22 1000  BP: 119/72  Pulse: 83  Resp: 16  Temp: (!) 97.3 F (36.3 C)  SpO2: 96%  Weight: 165 lb 3.2 oz (74.9 kg)  Height: 5\' 10"  (1.778 m)   Body mass index is 23.7 kg/m. Physical Exam Constitutional:      Appearance: Normal appearance.  Cardiovascular:     Rate and Rhythm: Normal rate. Rhythm irregular.     Pulses: Normal pulses.     Heart sounds: Normal heart sounds.  Pulmonary:     Effort: Pulmonary effort is normal.  Abdominal:     General: Abdomen is flat. Bowel sounds are normal.     Palpations: Abdomen is soft.  Musculoskeletal:        General: No swelling or tenderness.  Skin:    General: Skin is warm and dry.     Capillary Refill: Capillary refill takes 2 to 3 seconds.     Comments: Scarring skin changes of bilateral shins  Neurological:     Mental Status: He is alert and oriented to person, place, and time.     Gait: Gait normal.  Psychiatric:        Mood and Affect: Mood normal.     Labs reviewed: Recent Labs    12/04/21 0500 12/06/21 0348 02/08/22 1438 02/14/22 0000 05/12/22 0000  NA 136 132* 134* 138 138  K 3.6 3.7 3.9 3.5 3.9  CL 105 103 96* 103 103  CO2 25 22 24  31* 30*  GLUCOSE 164* 166* 281*  --   --   BUN 25* 26* 50* 37* 26*  CREATININE 0.79 0.79 1.20 1.0 0.7  CALCIUM 9.8 9.8 10.0 9.8 10.3   Recent Labs    02/08/22 1438 05/12/22 0000  AST 42* 13*  ALT 25 12  ALKPHOS 77 55  BILITOT 1.0  --   PROT 6.6  --   ALBUMIN 2.9* 3.1*   Recent Labs    12/06/21 0348 12/07/21 0623 02/08/22 1438  02/08/22 1438 02/14/22 0000 02/20/22 0000 02/24/22 0000 03/17/22 0000 03/24/22 0000 05/12/22 0000  WBC 11.0* 10.6* 11.9*   < > 5.4 6.4 6.9 8.4 9.3 6.9  NEUTROABS  --   --   --   --  3,440.00 4,736.00 4,720.00  --   --   --   HGB 10.4* 10.7* 12.5*  --  11.5* 10.7* 10.7* 9.5* 10.0* 10.6*  HCT 30.8* 30.9* 39.0  --  34* 32* 33* 29* 30* 32*  MCV 93.1 93.1 94.2  --   --   --   --   --   --   --   PLT 138* 139* 189  --  110* 143* 185 152 176 144*   < > = values in this interval not displayed.   Lab Results  Component Value Date   TSH 0.76 12/11/2012   Lab Results  Component Value Date   HGBA1C 7.1 05/05/2022   Lab Results  Component Value Date   CHOL 99 05/05/2022   HDL 48 05/05/2022   LDLCALC 37 05/05/2022   TRIG 48 05/05/2022   CHOLHDL 2 02/25/2021    Significant Diagnostic Results in last 30 days:  CUP PACEART REMOTE DEVICE CHECK  Result Date: 09/13/2022 Scheduled remote reviewed. Normal device function.  1 NSVT, 23 beats in duration Next remote 91 days. LA, CVRS   Assessment/Plan 1. Type 2 diabetes, controlled, with neuropathy (HCC) DM well-controlled with most recent A1c 7.1.  Continue metformin 500 mg twice daily.  2. Moderate protein-calorie malnutrition (HCC) MDD in remission Patient's weight has recently stabilized no obvious signs of weight loss at this time.  Mood is also improved continue sertraline 50 mg daily.  Will continue to monitor for a chance to reduce dose of medication in the future.  3. Atrial fibrillation, chronic (HCC)5. Atrial flutter, unspecified type (HCC) No signs of bleeding at this time continue apixaban 2.5 mg twice daily rate well-controlled without medication at this time. Pacemaker 08/2021.   4. Chronic diastolic heart failure (HCC) Patient appears euvolemic on exam with clear lungs and no leg swelling.  Continue to monitor. Previously on lasix 20 mg daily, however, discontinued in the setting of weight loss.   6. Aortic atherosclerosis  (HCC) Continue Lipitor 80 mg daily.  7. BPH with obstruction/lower urinary tract symptoms Symptoms well-controlled with tamsulosin and cranberry tablets.  Continue Proscar as well.  Denies symptoms at this time   Family/ staff Communication: nursing  Labs/tests ordered: Routine labs every 6 months next due November 2024.  A1c, CBC, BMP, TSH

## 2022-10-24 DIAGNOSIS — R278 Other lack of coordination: Secondary | ICD-10-CM | POA: Diagnosis not present

## 2022-10-24 DIAGNOSIS — E1140 Type 2 diabetes mellitus with diabetic neuropathy, unspecified: Secondary | ICD-10-CM | POA: Diagnosis not present

## 2022-10-24 DIAGNOSIS — Z9181 History of falling: Secondary | ICD-10-CM | POA: Diagnosis not present

## 2022-10-24 DIAGNOSIS — R2689 Other abnormalities of gait and mobility: Secondary | ICD-10-CM | POA: Diagnosis not present

## 2022-10-24 DIAGNOSIS — I442 Atrioventricular block, complete: Secondary | ICD-10-CM | POA: Diagnosis not present

## 2022-10-24 DIAGNOSIS — S42201D Unspecified fracture of upper end of right humerus, subsequent encounter for fracture with routine healing: Secondary | ICD-10-CM | POA: Diagnosis not present

## 2022-10-24 DIAGNOSIS — Z950 Presence of cardiac pacemaker: Secondary | ICD-10-CM | POA: Diagnosis not present

## 2022-10-24 DIAGNOSIS — D696 Thrombocytopenia, unspecified: Secondary | ICD-10-CM | POA: Diagnosis not present

## 2022-10-24 DIAGNOSIS — R262 Difficulty in walking, not elsewhere classified: Secondary | ICD-10-CM | POA: Diagnosis not present

## 2022-10-24 DIAGNOSIS — R2681 Unsteadiness on feet: Secondary | ICD-10-CM | POA: Diagnosis not present

## 2022-10-24 DIAGNOSIS — M19122 Post-traumatic osteoarthritis, left elbow: Secondary | ICD-10-CM | POA: Diagnosis not present

## 2022-10-24 DIAGNOSIS — M7022 Olecranon bursitis, left elbow: Secondary | ICD-10-CM | POA: Diagnosis not present

## 2022-10-24 DIAGNOSIS — I495 Sick sinus syndrome: Secondary | ICD-10-CM | POA: Diagnosis not present

## 2022-10-24 DIAGNOSIS — M6281 Muscle weakness (generalized): Secondary | ICD-10-CM | POA: Diagnosis not present

## 2022-10-24 DIAGNOSIS — S42402D Unspecified fracture of lower end of left humerus, subsequent encounter for fracture with routine healing: Secondary | ICD-10-CM | POA: Diagnosis not present

## 2022-10-28 ENCOUNTER — Non-Acute Institutional Stay (SKILLED_NURSING_FACILITY): Payer: Medicare Other | Admitting: Student

## 2022-10-28 ENCOUNTER — Encounter: Payer: Self-pay | Admitting: Student

## 2022-10-28 DIAGNOSIS — Z9181 History of falling: Secondary | ICD-10-CM | POA: Diagnosis not present

## 2022-10-28 DIAGNOSIS — S42402D Unspecified fracture of lower end of left humerus, subsequent encounter for fracture with routine healing: Secondary | ICD-10-CM | POA: Diagnosis not present

## 2022-10-28 DIAGNOSIS — E440 Moderate protein-calorie malnutrition: Secondary | ICD-10-CM

## 2022-10-28 DIAGNOSIS — G3184 Mild cognitive impairment, so stated: Secondary | ICD-10-CM | POA: Diagnosis not present

## 2022-10-28 DIAGNOSIS — M19122 Post-traumatic osteoarthritis, left elbow: Secondary | ICD-10-CM | POA: Diagnosis not present

## 2022-10-28 DIAGNOSIS — F324 Major depressive disorder, single episode, in partial remission: Secondary | ICD-10-CM | POA: Diagnosis not present

## 2022-10-28 DIAGNOSIS — R262 Difficulty in walking, not elsewhere classified: Secondary | ICD-10-CM | POA: Diagnosis not present

## 2022-10-28 DIAGNOSIS — M6281 Muscle weakness (generalized): Secondary | ICD-10-CM | POA: Diagnosis not present

## 2022-10-28 DIAGNOSIS — M7022 Olecranon bursitis, left elbow: Secondary | ICD-10-CM | POA: Diagnosis not present

## 2022-10-28 DIAGNOSIS — D696 Thrombocytopenia, unspecified: Secondary | ICD-10-CM | POA: Diagnosis not present

## 2022-10-28 DIAGNOSIS — S42201D Unspecified fracture of upper end of right humerus, subsequent encounter for fracture with routine healing: Secondary | ICD-10-CM | POA: Diagnosis not present

## 2022-10-28 DIAGNOSIS — Z950 Presence of cardiac pacemaker: Secondary | ICD-10-CM | POA: Diagnosis not present

## 2022-10-28 DIAGNOSIS — R2681 Unsteadiness on feet: Secondary | ICD-10-CM | POA: Diagnosis not present

## 2022-10-28 DIAGNOSIS — E1140 Type 2 diabetes mellitus with diabetic neuropathy, unspecified: Secondary | ICD-10-CM | POA: Diagnosis not present

## 2022-10-28 DIAGNOSIS — R2689 Other abnormalities of gait and mobility: Secondary | ICD-10-CM | POA: Diagnosis not present

## 2022-10-28 DIAGNOSIS — I442 Atrioventricular block, complete: Secondary | ICD-10-CM | POA: Diagnosis not present

## 2022-10-28 DIAGNOSIS — I495 Sick sinus syndrome: Secondary | ICD-10-CM | POA: Diagnosis not present

## 2022-10-28 DIAGNOSIS — R278 Other lack of coordination: Secondary | ICD-10-CM | POA: Diagnosis not present

## 2022-10-28 NOTE — Progress Notes (Signed)
Location:  Other [facility].  Nursing Home Room Number: [facility] [address] Place of Service:  SNF [id number]) Provider:  [name], MD  Patient Care Team: [name], MD as PCP - General (Family Medicine) Antonieta Iba, MD as PCP - Cardiology (Cardiology) Antonieta Iba, MD as Consulting Physician (Cardiology)  Extended Emergency Contact Information Primary Emergency Contact: [name] [facility]) Address: [address]          [place], [place] [address] [name] of [employer] Phone: [phone] Relation: Son  Code Status:  DNR Goals of care: Advanced Directive information    10/28/2022    9:01 AM  Advanced Directives  Does Patient Have a Medical Advance Directive? Yes  Type of Estate agent of [place];Out of facility DNR (pink MOST or yellow form);Living will  Does patient want to make changes to medical advance directive? No - Patient declined  Copy of Healthcare Power of Attorney in Chart? Yes - validated most recent copy scanned in chart (See row information)     Chief Complaint  Patient presents with   Acute Visit    Memory Changes.     HPI:  Pt is a 89 y.o. male seen today for an acute visit for Memory Changes.   Patient's most recent care plan children brought up concern that he has discussed getting an apartment with a nurse and nursing feeling uncomfortable interacting with him.  Discussed most recent MoCA score is consistent with mild cognitive impairment and patient without significant decline at this time.  Discussed concern that perhaps he was having these discussions with people due to progression of memory loss, however patient endorses some confusion regarding what offhand comments he could be providing.  Named specific nursing staff who reported patient's behaviors, he states he will make a adjustment in how he engages with nursing.  Discussed importance of having a safe work environment for all people as we care  for him in his home space here at the facility.  Discussed how making these comments could make a worker feel unsafe as well as implying relationship with residence that is not desired, or appropriate.  Patient apologizes for any offhand comments and states again he will stop that for the future.  Discussed patients MoCA 22/30 Discussed left elbow and history of fracture, patient endorses some enlargement of the elbow.  Discussed formation of a bone callus in the event of a healed fracture.  Continue to monitor declined x-ray at this time. Abdomen feels like it is bulging more on the right than the left, however no pain or changes in bowel or urinary habits.  Past Medical History:  Diagnosis Date   Asthma    Atrial flutter (HCC) 2007   Band keratopathy    Benign prostatic hypertrophy    Chronic ear infection    Chronic kidney disease    Chronic prostatitis    Dental bridge present    permanent - upper   Diabetes mellitus    Elbow stiffness, left    s/p fracture many yrs ago.  arm does not straighten   GERD (gastroesophageal reflux disease)    laryngeal involvement   Hyperlipidemia    Hypertension    Obstructive sleep apnea    CPAP-9   Osteoarthrosis, localized, primary, knee    post-traumatic   Prostatitis    Stroke [facility])    TIA   Tachy-brady syndrome (HCC)    UTI (urinary tract infection)    Past Surgical History:  Procedure Laterality  Date   APPENDECTOMY     BELPHAROPTOSIS REPAIR     Dr [name]---didn't resolve weepy eye and eyelid drooping   CARDIOVERSION  04/13/2010   CATARACT EXTRACTION, BILATERAL  2009   Chest pain  8/12   Stress test benign   ESOPHAGEAL DILATION  05/26/2016   Procedure: ESOPHAGEAL DILATION;  Surgeon: [name], MD;  Location: [facility];  Service: Endoscopy;;   ESOPHAGOGASTRODUODENOSCOPY (EGD) WITH PROPOFOL N/A 05/26/2016   Procedure: ESOPHAGOGASTRODUODENOSCOPY (EGD) WITH PROPOFOL;  Surgeon: [name], MD;  Location: [facility];  Service: Endoscopy;  Laterality: N/A;  Diabetic - oral meds sleep apnea   EYE SURGERY     FRACTURE SURGERY Left    elbow   KNEE SURGERY  1998   plate after fracture, then removed for infection   left elbow surgery     MASTOIDECTOMY  8/08   Dr [name]   PACEMAKER IMPLANT N/A 09/02/2021   Procedure: PACEMAKER IMPLANT;  Surgeon: [name], MD;  Location: [facility] INVASIVE CV LAB;  Service: Cardiovascular;  Laterality: N/A;   RHINOPLASTY  5/10   and septoplasty   SUBACROMIAL DECOMPRESSION Right 2005   Arthroscopic (for rotator cuff and biceps tendon ruptures)   TEAR DUCT PROBING  11/13   Dr [name]   TOTAL KNEE ARTHROPLASTY Left 09/01/2014   Procedure: TOTAL KNEE ARTHROPLASTY;  Surgeon: [name], MD;  Location: [facility] ORS;  Service: Orthopedics;  Laterality: Left;   TRIGGER FINGER RELEASE Left 02/25/2015   Procedure: LEFT LONG TRIGGER RELEASE;  Surgeon: [name], MD;  Location: [facility] ORS;  Service: Orthopedics;  Laterality: Left;   VENOUS ABLATION      Allergies  Allergen Reactions   Lovenox [Enoxaparin] Hives   Proscar [Finasteride] Other (See Comments)    Upper body and arm weakness   Latex Rash    Has trouble with BANDAIDS that have been left on for more than 24 hours. Prefers paper tape.   Nickel Rash    Localized rash   Percocet [Oxycodone-Acetaminophen] Rash   Tape Rash and Other (See Comments)    Sensitivity     Outpatient Encounter Medications as of 10/28/2022  Medication Sig   acetaminophen (TYLENOL) 500 MG tablet Take 1,000 mg by mouth every 8 (eight) hours as needed.   apixaban (ELIQUIS) 2.5 MG TABS tablet Take 2.5 mg by mouth 2 (two) times daily.   APPLE CIDER VINEGAR PO Take 1 capsule by mouth at bedtime.   atorvastatin (LIPITOR) 80 MG tablet Take 1 tablet (80 mg total) by mouth daily.   Cranberry 250 MG TABS Take 1 tablet by mouth 2 (two) times daily.   Cyanocobalamin (B-12 PO) Take 1,000 mg by mouth daily.   finasteride (PROSCAR) 5 MG tablet  Take 1 tablet (5 mg total) by mouth daily.   guaiFENesin (MUCINEX) 600 MG 12 hr tablet Take 600 mg by mouth every 8 (eight) hours as needed.   guaiFENesin (TUSSIN PO) Take 10 mLs by mouth every 4 (four) hours as needed.   lactose free nutrition (BOOST) LIQD Take 237 mLs by mouth 3 (three) times daily between meals.   lidocaine (LIDODERM) 5 % Place 1 patch onto the skin daily. Remove & Discard patch within 12 hours or as directed by MD   Magnesium Oxide 400 MG CAPS Take 1 capsule (400 mg total) by mouth daily.   metFORMIN (GLUCOPHAGE) 500 MG tablet TAKE 1 TABLET BY MOUTH  TWICE DAILY WITH MEALS   mineral oil-hydrophilic petrolatum (AQUAPHOR) ointment  Apply 1 Application topically daily as needed for dry skin. To left lower leg   Polyethyl Glycol-Propyl Glycol (SYSTANE ULTRA OP) Place 1 drop into both eyes daily as needed (dry eyes).   polyethylene glycol (MIRALAX / GLYCOLAX) 17 g packet Take 17 g by mouth. Daily every Monday, Wednesday and Friday.   sertraline (ZOLOFT) 50 MG tablet Take 50 mg by mouth daily.   sodium chloride (OCEAN) 0.65 % SOLN nasal spray Place 2 sprays into both nostrils 2 (two) times daily as needed for congestion.   Sodium Fluoride (PREVIDENT 5000 BOOSTER PLUS) 1.1 % PSTE Place 1 application  onto teeth at bedtime as needed.   tamsulosin (FLOMAX) 0.4 MG CAPS capsule Take 2 capsules (0.8 mg total) by mouth at bedtime.   Wheat Dextrin (BENEFIBER) POWD Give 2 tsp by mouth in the morning for Bowel Regulation mix in 8oz liquid of resident's choice   Zinc Oxide (TRIPLE PASTE) 12.8 % ointment Apply 1 Application topically. Every shift to bilateral buttocks.   No facility-administered encounter medications on file as of 10/28/2022.    Review of Systems  Immunization History  Administered Date(s) Administered   Fluad Quad(high Dose 65+) 09/14/2018   H1N1 12/13/2007   Influenza Split 09/11/2010   Influenza Whole 09/12/2011   Influenza, High Dose Seasonal PF 09/20/2012,  09/07/2017   Influenza, Seasonal, Injecte, Preservative Fre 09/08/2007, 09/26/2008, 10/02/2014, 09/18/2015   Influenza,inj,Quad PF,6+ Mos 09/19/2013, 09/21/2015   Influenza-Unspecified 09/07/2017, 09/11/2019, 09/03/2020, 10/19/2021, 10/26/2022   Moderna Sars-Covid-2 Vaccination 01/18/2019, 02/15/2019, 11/19/2019, 10/17/2020, 06/01/2021   PNEUMOCOCCAL CONJUGATE-20 12/13/2021   Pneumococcal Conjugate-13 09/19/2013   Pneumococcal Polysaccharide-23 09/26/2003, 06/28/2016   Td 08/04/1998   Tdap 06/04/2010, 08/15/2021   Tetanus 08/04/1998   Unspecified SARS-COV-2 Vaccination 06/01/2021, 09/30/2022   Zoster Recombinant(Shingrix) 06/29/2022   Zoster, Live 01/16/2004   Pertinent  Health Maintenance Due  Topic Date Due   FOOT EXAM  08/20/2022   HEMOGLOBIN A1C  11/05/2022   OPHTHALMOLOGY EXAM  06/01/2023   INFLUENZA VACCINE  Completed      12/05/2021   10:38 PM 12/06/2021    7:05 AM 12/06/2021    7:40 PM 12/07/2021    8:45 AM 06/28/2022    2:03 PM  Fall Risk  Falls in the past year?     1  Was there an injury with Fall?     1  Fall Risk Category Calculator     3  (RETIRED) Patient Fall Risk Level Moderate fall risk High fall risk High fall risk High fall risk   Patient at Risk for Falls Due to     History of fall(s);Impaired balance/gait;Impaired mobility;Impaired vision   Functional Status Survey:    Vitals:   10/28/22 0853  BP: 124/61  Pulse: 68  Resp: 16  Temp: 98.1 F (36.7 C)  SpO2: 97%  Weight: 165 lb 3.2 oz (74.9 kg)  Height: 5\' 10"  (1.778 m)   Body mass index is 23.7 kg/m. Physical Exam Constitutional:      Comments: thin  Abdominal:     General: Abdomen is flat. There is no distension.     Palpations: Abdomen is soft. There is no mass.     Tenderness: There is no abdominal tenderness. There is no guarding or rebound.     Hernia: No hernia is present.  Musculoskeletal:     Comments: Left elbow with bony prominence  Skin:    General: Skin is warm and dry.   Neurological:     Mental Status: He is alert  and oriented to person, place, and time.     Labs reviewed: Recent Labs    12/04/21 0500 12/06/21 0348 02/08/22 1438 02/14/22 0000 05/12/22 0000  NA 136 132* 134* 138 138  K 3.6 3.7 3.9 3.5 3.9  CL 105 103 96* 103 103  CO2 25 22 24  31* 30*  GLUCOSE 164* 166* 281*  --   --   BUN 25* 26* 50* 37* 26*  CREATININE 0.79 0.79 1.20 1.0 0.7  CALCIUM 9.8 9.8 10.0 9.8 10.3   Recent Labs    02/08/22 1438 05/12/22 0000  AST 42* 13*  ALT 25 12  ALKPHOS 77 55  BILITOT 1.0  --   PROT 6.6  --   ALBUMIN 2.9* 3.1*   Recent Labs    12/06/21 0348 12/07/21 0623 02/08/22 1438 02/08/22 1438 02/14/22 0000 02/20/22 0000 02/24/22 0000 03/17/22 0000 03/24/22 0000 05/12/22 0000  WBC 11.0* 10.6* 11.9*   < > 5.4 6.4 6.9 8.4 9.3 6.9  NEUTROABS  --   --   --   --  3,440.00 4,736.00 4,720.00  --   --   --   HGB 10.4* 10.7* 12.5*  --  11.5* 10.7* 10.7* 9.5* 10.0* 10.6*  HCT 30.8* 30.9* 39.0  --  34* 32* 33* 29* 30* 32*  MCV 93.1 93.1 94.2  --   --   --   --   --   --   --   PLT 138* 139* 189  --  110* 143* 185 152 176 144*   < > = values in this interval not displayed.   Lab Results  Component Value Date   TSH 0.76 12/11/2012   Lab Results  Component Value Date   HGBA1C 7.1 05/05/2022   Lab Results  Component Value Date   CHOL 99 05/05/2022   HDL 48 05/05/2022   LDLCALC 37 05/05/2022   TRIG 48 05/05/2022   CHOLHDL 2 02/25/2021    Significant Diagnostic Results in last 30 days:  No results found.  Assessment/Plan Major depressive disorder with single episode, in partial remission (HCC)  MCI (mild cognitive impairment)  Closed fracture of left elbow with routine healing, subsequent encounter  Moderate protein-calorie malnutrition (HCC) With history of depression and mild cognitive impairment.  Depression significantly improved with initiation of SSRI.  Recent increase in inappropriate conversation with employees of the  facility, discouraged this behavior in the facility as patient is alert and oriented and does not have severe progression of dementia to where he would be nonredirectable.  Left elbow likely enlarged callus due to history of fracture, defer x-ray at this time.  History of protein nutrition leading to atrophy of muscles and skin likely leading to the palpable muscles which twitch with movement in his abdomen that he is concerned about this time.  Will defer further imaging continue monitor.  Nursing advised.  Family/ staff Communication: nursing to inform provider if patient has continued inappropriate commentary  Labs/tests ordered:  none

## 2022-10-31 DIAGNOSIS — S42201D Unspecified fracture of upper end of right humerus, subsequent encounter for fracture with routine healing: Secondary | ICD-10-CM | POA: Diagnosis not present

## 2022-10-31 DIAGNOSIS — R2689 Other abnormalities of gait and mobility: Secondary | ICD-10-CM | POA: Diagnosis not present

## 2022-10-31 DIAGNOSIS — Z950 Presence of cardiac pacemaker: Secondary | ICD-10-CM | POA: Diagnosis not present

## 2022-10-31 DIAGNOSIS — I495 Sick sinus syndrome: Secondary | ICD-10-CM | POA: Diagnosis not present

## 2022-10-31 DIAGNOSIS — M7022 Olecranon bursitis, left elbow: Secondary | ICD-10-CM | POA: Diagnosis not present

## 2022-10-31 DIAGNOSIS — R2681 Unsteadiness on feet: Secondary | ICD-10-CM | POA: Diagnosis not present

## 2022-10-31 DIAGNOSIS — Z9181 History of falling: Secondary | ICD-10-CM | POA: Diagnosis not present

## 2022-10-31 DIAGNOSIS — R278 Other lack of coordination: Secondary | ICD-10-CM | POA: Diagnosis not present

## 2022-10-31 DIAGNOSIS — R262 Difficulty in walking, not elsewhere classified: Secondary | ICD-10-CM | POA: Diagnosis not present

## 2022-10-31 DIAGNOSIS — I442 Atrioventricular block, complete: Secondary | ICD-10-CM | POA: Diagnosis not present

## 2022-10-31 DIAGNOSIS — S42402D Unspecified fracture of lower end of left humerus, subsequent encounter for fracture with routine healing: Secondary | ICD-10-CM | POA: Diagnosis not present

## 2022-10-31 DIAGNOSIS — M19122 Post-traumatic osteoarthritis, left elbow: Secondary | ICD-10-CM | POA: Diagnosis not present

## 2022-10-31 DIAGNOSIS — M6281 Muscle weakness (generalized): Secondary | ICD-10-CM | POA: Diagnosis not present

## 2022-10-31 DIAGNOSIS — E1140 Type 2 diabetes mellitus with diabetic neuropathy, unspecified: Secondary | ICD-10-CM | POA: Diagnosis not present

## 2022-10-31 DIAGNOSIS — D696 Thrombocytopenia, unspecified: Secondary | ICD-10-CM | POA: Diagnosis not present

## 2022-11-04 DIAGNOSIS — I495 Sick sinus syndrome: Secondary | ICD-10-CM | POA: Diagnosis not present

## 2022-11-04 DIAGNOSIS — R262 Difficulty in walking, not elsewhere classified: Secondary | ICD-10-CM | POA: Diagnosis not present

## 2022-11-04 DIAGNOSIS — E1140 Type 2 diabetes mellitus with diabetic neuropathy, unspecified: Secondary | ICD-10-CM | POA: Diagnosis not present

## 2022-11-04 DIAGNOSIS — Z950 Presence of cardiac pacemaker: Secondary | ICD-10-CM | POA: Diagnosis not present

## 2022-11-04 DIAGNOSIS — S42201D Unspecified fracture of upper end of right humerus, subsequent encounter for fracture with routine healing: Secondary | ICD-10-CM | POA: Diagnosis not present

## 2022-11-04 DIAGNOSIS — I442 Atrioventricular block, complete: Secondary | ICD-10-CM | POA: Diagnosis not present

## 2022-11-04 DIAGNOSIS — R2689 Other abnormalities of gait and mobility: Secondary | ICD-10-CM | POA: Diagnosis not present

## 2022-11-04 DIAGNOSIS — S42402D Unspecified fracture of lower end of left humerus, subsequent encounter for fracture with routine healing: Secondary | ICD-10-CM | POA: Diagnosis not present

## 2022-11-04 DIAGNOSIS — M6281 Muscle weakness (generalized): Secondary | ICD-10-CM | POA: Diagnosis not present

## 2022-11-04 DIAGNOSIS — M19122 Post-traumatic osteoarthritis, left elbow: Secondary | ICD-10-CM | POA: Diagnosis not present

## 2022-11-04 DIAGNOSIS — M7022 Olecranon bursitis, left elbow: Secondary | ICD-10-CM | POA: Diagnosis not present

## 2022-11-04 DIAGNOSIS — D696 Thrombocytopenia, unspecified: Secondary | ICD-10-CM | POA: Diagnosis not present

## 2022-11-04 DIAGNOSIS — R278 Other lack of coordination: Secondary | ICD-10-CM | POA: Diagnosis not present

## 2022-11-04 DIAGNOSIS — Z9181 History of falling: Secondary | ICD-10-CM | POA: Diagnosis not present

## 2022-11-04 DIAGNOSIS — R2681 Unsteadiness on feet: Secondary | ICD-10-CM | POA: Diagnosis not present

## 2022-11-07 ENCOUNTER — Ambulatory Visit: Payer: Medicare Other | Admitting: Podiatry

## 2022-11-07 DIAGNOSIS — I509 Heart failure, unspecified: Secondary | ICD-10-CM | POA: Diagnosis not present

## 2022-11-07 DIAGNOSIS — E785 Hyperlipidemia, unspecified: Secondary | ICD-10-CM | POA: Diagnosis not present

## 2022-11-07 DIAGNOSIS — M7022 Olecranon bursitis, left elbow: Secondary | ICD-10-CM | POA: Diagnosis not present

## 2022-11-07 DIAGNOSIS — R2689 Other abnormalities of gait and mobility: Secondary | ICD-10-CM | POA: Diagnosis not present

## 2022-11-07 DIAGNOSIS — I495 Sick sinus syndrome: Secondary | ICD-10-CM | POA: Diagnosis not present

## 2022-11-07 DIAGNOSIS — M6281 Muscle weakness (generalized): Secondary | ICD-10-CM | POA: Diagnosis not present

## 2022-11-07 DIAGNOSIS — E1140 Type 2 diabetes mellitus with diabetic neuropathy, unspecified: Secondary | ICD-10-CM | POA: Diagnosis not present

## 2022-11-07 DIAGNOSIS — S42201D Unspecified fracture of upper end of right humerus, subsequent encounter for fracture with routine healing: Secondary | ICD-10-CM | POA: Diagnosis not present

## 2022-11-07 DIAGNOSIS — E119 Type 2 diabetes mellitus without complications: Secondary | ICD-10-CM | POA: Diagnosis not present

## 2022-11-07 DIAGNOSIS — Z9181 History of falling: Secondary | ICD-10-CM | POA: Diagnosis not present

## 2022-11-07 DIAGNOSIS — M19122 Post-traumatic osteoarthritis, left elbow: Secondary | ICD-10-CM | POA: Diagnosis not present

## 2022-11-07 DIAGNOSIS — R262 Difficulty in walking, not elsewhere classified: Secondary | ICD-10-CM | POA: Diagnosis not present

## 2022-11-07 DIAGNOSIS — Z950 Presence of cardiac pacemaker: Secondary | ICD-10-CM | POA: Diagnosis not present

## 2022-11-07 DIAGNOSIS — I442 Atrioventricular block, complete: Secondary | ICD-10-CM | POA: Diagnosis not present

## 2022-11-07 DIAGNOSIS — E039 Hypothyroidism, unspecified: Secondary | ICD-10-CM | POA: Diagnosis not present

## 2022-11-07 DIAGNOSIS — R2681 Unsteadiness on feet: Secondary | ICD-10-CM | POA: Diagnosis not present

## 2022-11-07 DIAGNOSIS — I10 Essential (primary) hypertension: Secondary | ICD-10-CM | POA: Diagnosis not present

## 2022-11-07 DIAGNOSIS — S42402D Unspecified fracture of lower end of left humerus, subsequent encounter for fracture with routine healing: Secondary | ICD-10-CM | POA: Diagnosis not present

## 2022-11-07 DIAGNOSIS — D696 Thrombocytopenia, unspecified: Secondary | ICD-10-CM | POA: Diagnosis not present

## 2022-11-07 DIAGNOSIS — R278 Other lack of coordination: Secondary | ICD-10-CM | POA: Diagnosis not present

## 2022-11-07 LAB — COMPREHENSIVE METABOLIC PANEL
Albumin: 3.4 — AB (ref 3.5–5.0)
Globulin: 2.2
eGFR: 82

## 2022-11-07 LAB — LIPID PANEL
Cholesterol: 95 (ref 0–200)
HDL: 59 (ref 35–70)
LDL Cholesterol: 27
Triglycerides: 31 — AB (ref 40–160)

## 2022-11-07 LAB — HEMOGLOBIN A1C: Hemoglobin A1C: 6.4

## 2022-11-07 LAB — CBC: RBC: 3.51 — AB (ref 3.87–5.11)

## 2022-11-07 LAB — CBC AND DIFFERENTIAL
HCT: 35 — AB (ref 41–53)
Hemoglobin: 11.2 — AB (ref 13.5–17.5)
Platelets: 113 10*3/uL — AB (ref 150–400)
WBC: 5.2

## 2022-11-07 LAB — BASIC METABOLIC PANEL
BUN: 38 — AB (ref 4–21)
CO2: 29 — AB (ref 13–22)
Chloride: 104 (ref 99–108)
Creatinine: 0.9 (ref 0.6–1.3)
Glucose: 115
Potassium: 4 meq/L (ref 3.5–5.1)
Sodium: 138 (ref 137–147)

## 2022-11-07 LAB — HEPATIC FUNCTION PANEL
ALT: 13 U/L (ref 10–40)
AST: 13 — AB (ref 14–40)
Alkaline Phosphatase: 63 (ref 25–125)
Bilirubin, Total: 0.5

## 2022-11-07 LAB — TSH: TSH: 1.32 (ref 0.41–5.90)

## 2022-11-18 DIAGNOSIS — Z9181 History of falling: Secondary | ICD-10-CM | POA: Diagnosis not present

## 2022-11-18 DIAGNOSIS — R262 Difficulty in walking, not elsewhere classified: Secondary | ICD-10-CM | POA: Diagnosis not present

## 2022-11-18 DIAGNOSIS — S42201D Unspecified fracture of upper end of right humerus, subsequent encounter for fracture with routine healing: Secondary | ICD-10-CM | POA: Diagnosis not present

## 2022-11-18 DIAGNOSIS — M7022 Olecranon bursitis, left elbow: Secondary | ICD-10-CM | POA: Diagnosis not present

## 2022-11-18 DIAGNOSIS — R2689 Other abnormalities of gait and mobility: Secondary | ICD-10-CM | POA: Diagnosis not present

## 2022-11-18 DIAGNOSIS — I442 Atrioventricular block, complete: Secondary | ICD-10-CM | POA: Diagnosis not present

## 2022-11-18 DIAGNOSIS — M6281 Muscle weakness (generalized): Secondary | ICD-10-CM | POA: Diagnosis not present

## 2022-11-18 DIAGNOSIS — E1140 Type 2 diabetes mellitus with diabetic neuropathy, unspecified: Secondary | ICD-10-CM | POA: Diagnosis not present

## 2022-11-18 DIAGNOSIS — D696 Thrombocytopenia, unspecified: Secondary | ICD-10-CM | POA: Diagnosis not present

## 2022-11-18 DIAGNOSIS — M19122 Post-traumatic osteoarthritis, left elbow: Secondary | ICD-10-CM | POA: Diagnosis not present

## 2022-11-18 DIAGNOSIS — R2681 Unsteadiness on feet: Secondary | ICD-10-CM | POA: Diagnosis not present

## 2022-11-18 DIAGNOSIS — Z950 Presence of cardiac pacemaker: Secondary | ICD-10-CM | POA: Diagnosis not present

## 2022-11-18 DIAGNOSIS — S42402D Unspecified fracture of lower end of left humerus, subsequent encounter for fracture with routine healing: Secondary | ICD-10-CM | POA: Diagnosis not present

## 2022-11-18 DIAGNOSIS — I495 Sick sinus syndrome: Secondary | ICD-10-CM | POA: Diagnosis not present

## 2022-11-18 DIAGNOSIS — R278 Other lack of coordination: Secondary | ICD-10-CM | POA: Diagnosis not present

## 2022-12-12 ENCOUNTER — Ambulatory Visit: Payer: Medicare Other

## 2022-12-12 DIAGNOSIS — I442 Atrioventricular block, complete: Secondary | ICD-10-CM

## 2022-12-15 ENCOUNTER — Encounter: Payer: Self-pay | Admitting: Nurse Practitioner

## 2022-12-15 ENCOUNTER — Non-Acute Institutional Stay (SKILLED_NURSING_FACILITY): Payer: Medicare Other | Admitting: Nurse Practitioner

## 2022-12-15 DIAGNOSIS — N401 Enlarged prostate with lower urinary tract symptoms: Secondary | ICD-10-CM

## 2022-12-15 DIAGNOSIS — I482 Chronic atrial fibrillation, unspecified: Secondary | ICD-10-CM

## 2022-12-15 DIAGNOSIS — N138 Other obstructive and reflux uropathy: Secondary | ICD-10-CM | POA: Diagnosis not present

## 2022-12-15 DIAGNOSIS — E782 Mixed hyperlipidemia: Secondary | ICD-10-CM | POA: Diagnosis not present

## 2022-12-15 DIAGNOSIS — I5032 Chronic diastolic (congestive) heart failure: Secondary | ICD-10-CM | POA: Diagnosis not present

## 2022-12-15 DIAGNOSIS — E1140 Type 2 diabetes mellitus with diabetic neuropathy, unspecified: Secondary | ICD-10-CM | POA: Diagnosis not present

## 2022-12-15 DIAGNOSIS — K5900 Constipation, unspecified: Secondary | ICD-10-CM

## 2022-12-15 DIAGNOSIS — F324 Major depressive disorder, single episode, in partial remission: Secondary | ICD-10-CM

## 2022-12-15 LAB — CUP PACEART REMOTE DEVICE CHECK
Battery Remaining Longevity: 114 mo
Battery Remaining Percentage: 100 %
Brady Statistic RA Percent Paced: 18 %
Brady Statistic RV Percent Paced: 93 %
Date Time Interrogation Session: 20241209000100
Implantable Lead Connection Status: 753985
Implantable Lead Connection Status: 753985
Implantable Lead Implant Date: 20230831
Implantable Lead Implant Date: 20230831
Implantable Lead Location: 753859
Implantable Lead Location: 753860
Implantable Lead Model: 7841
Implantable Lead Model: 7842
Implantable Lead Serial Number: 1220926
Implantable Lead Serial Number: 1322603
Implantable Pulse Generator Implant Date: 20230831
Lead Channel Impedance Value: 510 Ohm
Lead Channel Impedance Value: 533 Ohm
Lead Channel Pacing Threshold Amplitude: 0.6 V
Lead Channel Pacing Threshold Amplitude: 1.1 V
Lead Channel Pacing Threshold Pulse Width: 0.4 ms
Lead Channel Pacing Threshold Pulse Width: 0.4 ms
Lead Channel Setting Pacing Amplitude: 3.5 V
Lead Channel Setting Pacing Amplitude: 3.5 V
Lead Channel Setting Pacing Pulse Width: 0.4 ms
Lead Channel Setting Sensing Sensitivity: 3.5 mV
Pulse Gen Serial Number: 613820
Zone Setting Status: 755011

## 2022-12-15 NOTE — Progress Notes (Signed)
Location:  Other Nursing Home Room Number: [facility] [address]A Place of Service:  SNF ([phone])  Sydnee Cabal, [place], MD  Patient Care Team: [name], MD as PCP - General (Family Medicine) [name], MD as PCP - Cardiology (Cardiology) [name], MD as Consulting Physician (Cardiology)  Extended Emergency Contact Information Primary Emergency Contact: [place], [facility]) Address: [address]          [place], [place] [address] [name] of [employer] Phone: [phone] Relation: Son  Goals of care: Advanced Directive information    12/15/2022    3:40 PM  Advanced Directives  Does Patient Have a Medical Advance Directive? Yes  Type of Estate agent of [place];Out of facility DNR (pink MOST or yellow form);Living will  Does patient want to make changes to medical advance directive? No - Patient declined  Copy of Healthcare Power of Attorney in Chart? Yes - validated most recent copy scanned in chart (See row information)     Chief Complaint  Patient presents with   Medical Management of Chronic Issues    Medical Management of Chronic Issues.     HPI:  Pt is a 89 y.o. male seen today for medical management of chronic disease.  Pt with hx of DM, hyperlipidemia, mood disorder, BPH, constipation He has been doing well.  Staff reports he has been doing well and does not have any acute concerns. He is eating well.  No issues with constipation or diarrhea.  Does not report  any urinary complaints.   Past Medical History:  Diagnosis Date   Asthma    Atrial flutter (HCC) 2007   Band keratopathy    Benign prostatic hypertrophy    Chronic ear infection    Chronic kidney disease    Chronic prostatitis    Dental bridge present    permanent - upper   Diabetes mellitus    Elbow stiffness, left    s/p fracture many yrs ago.  arm does not straighten   GERD (gastroesophageal reflux disease)    laryngeal involvement    Hyperlipidemia    Hypertension    Obstructive sleep apnea    CPAP-9   Osteoarthrosis, localized, primary, knee    post-traumatic   Prostatitis    Stroke (HCC)    TIA   Tachy-brady syndrome (HCC)    UTI (urinary tract infection)    Past Surgical History:  Procedure Laterality Date   APPENDECTOMY     BELPHAROPTOSIS REPAIR     Dr [name]---didn't resolve weepy eye and eyelid drooping   CARDIOVERSION  04/13/2010   CATARACT EXTRACTION, BILATERAL  2009   Chest pain  8/12   Stress test benign   ESOPHAGEAL DILATION  05/26/2016   Procedure: ESOPHAGEAL DILATION;  Surgeon: [name], MD;  Location: [facility];  Service: Endoscopy;;   ESOPHAGOGASTRODUODENOSCOPY (EGD) WITH PROPOFOL N/A 05/26/2016   Procedure: ESOPHAGOGASTRODUODENOSCOPY (EGD) WITH PROPOFOL;  Surgeon: [name], MD;  Location: [facility];  Service: Endoscopy;  Laterality: N/A;  Diabetic - oral meds sleep apnea   EYE SURGERY     FRACTURE SURGERY Left    elbow   KNEE SURGERY  1998   plate after fracture, then removed for infection   left elbow surgery     MASTOIDECTOMY  8/08   Dr [name]   PACEMAKER IMPLANT N/A 09/02/2021   Procedure: PACEMAKER IMPLANT;  Surgeon: [name], MD;  Location: [facility] INVASIVE CV LAB;  Service: Cardiovascular;  Laterality: N/A;   RHINOPLASTY  5/10   and septoplasty   SUBACROMIAL DECOMPRESSION Right 2005   Arthroscopic (for rotator cuff and biceps tendon ruptures)   TEAR DUCT PROBING  11/13   Dr [name]   TOTAL KNEE ARTHROPLASTY Left 09/01/2014   Procedure: TOTAL KNEE ARTHROPLASTY;  Surgeon: [name], MD;  Location: [facility] ORS;  Service: Orthopedics;  Laterality: Left;   TRIGGER FINGER RELEASE Left 02/25/2015   Procedure: LEFT LONG TRIGGER RELEASE;  Surgeon: [name], MD;  Location: [facility] ORS;  Service: Orthopedics;  Laterality: Left;   VENOUS ABLATION      Allergies  Allergen Reactions   Lovenox [Enoxaparin] Hives   Proscar [Finasteride] Other (See  Comments)    Upper body and arm weakness   Latex Rash    Has trouble with BANDAIDS that have been left on for more than 24 hours. Prefers paper tape.   Nickel Rash    Localized rash   Percocet [Oxycodone-Acetaminophen] Rash   Tape Rash and Other (See Comments)    Sensitivity     Outpatient Encounter Medications as of 12/15/2022  Medication Sig   acetaminophen (TYLENOL) 500 MG tablet Take 1,000 mg by mouth every 8 (eight) hours as needed.   apixaban (ELIQUIS) 2.5 MG TABS tablet Take 2.5 mg by mouth 2 (two) times daily.   APPLE CIDER VINEGAR PO Take 1 capsule by mouth at bedtime.   atorvastatin (LIPITOR) 80 MG tablet Take 1 tablet (80 mg total) by mouth daily.   Cranberry 250 MG TABS Take 1 tablet by mouth 2 (two) times daily.   Cyanocobalamin (B-12 PO) Take 1,000 mg by mouth daily.   finasteride (PROSCAR) 5 MG tablet Take 1 tablet (5 mg total) by mouth daily.   guaiFENesin (MUCINEX) 600 MG 12 hr tablet Take 600 mg by mouth every 8 (eight) hours as needed.   guaiFENesin (TUSSIN PO) Take 10 mLs by mouth every 4 (four) hours as needed.   lactose free nutrition (BOOST) LIQD Take 237 mLs by mouth 3 (three) times daily between meals.   lidocaine (LIDODERM) 5 % Place 1 patch onto the skin daily. Remove & Discard patch within 12 hours or as directed by MD   Magnesium Oxide 400 MG CAPS Take 1 capsule (400 mg total) by mouth daily.   metFORMIN (GLUCOPHAGE) 500 MG tablet TAKE 1 TABLET BY MOUTH  TWICE DAILY WITH MEALS   mineral oil-hydrophilic petrolatum (AQUAPHOR) ointment Apply 1 Application topically daily as needed for dry skin. To left lower leg   Polyethyl Glycol-Propyl Glycol (SYSTANE ULTRA OP) Place 1 drop into both eyes daily as needed (dry eyes).   polyethylene glycol (MIRALAX / GLYCOLAX) 17 g packet Take 17 g by mouth. Daily every Monday, Wednesday and Friday.   saccharomyces boulardii (FLORASTOR) 250 MG capsule Take 250 mg by mouth daily.   sertraline (ZOLOFT) 50 MG tablet Take 50 mg by  mouth daily.   sodium chloride (OCEAN) 0.65 % SOLN nasal spray Place 2 sprays into both nostrils 2 (two) times daily as needed for congestion.   Sodium Fluoride (PREVIDENT 5000 BOOSTER PLUS) 1.1 % PSTE Place 1 application  onto teeth at bedtime as needed.   tamsulosin (FLOMAX) 0.4 MG CAPS capsule Take 2 capsules (0.8 mg total) by mouth at bedtime.   [DISCONTINUED] Wheat Dextrin (BENEFIBER) POWD Give 2 tsp by mouth in the morning for Bowel Regulation mix in 8oz liquid of resident's choice   [DISCONTINUED] Zinc Oxide (TRIPLE PASTE) 12.8 % ointment Apply 1 Application topically. Every shift to  bilateral buttocks.   No facility-administered encounter medications on file as of 12/15/2022.    Review of Systems  Constitutional:  Negative for activity change, appetite change, fatigue and unexpected weight change.  HENT:  Negative for congestion and hearing loss.   Eyes: Negative.   Respiratory:  Negative for cough and shortness of breath.   Cardiovascular:  Negative for chest pain, palpitations and leg swelling.  Gastrointestinal:  Negative for abdominal pain, constipation and diarrhea.  Genitourinary:  Negative for difficulty urinating and dysuria.  Musculoskeletal:  Negative for arthralgias and myalgias.  Skin:  Negative for color change and wound.  Neurological:  Negative for dizziness and weakness.  Psychiatric/Behavioral:  Negative for agitation, behavioral problems and confusion.      Immunization History  Administered Date(s) Administered   Fluad Quad(high Dose 65+) 09/14/2018   H1N1 12/13/2007   Influenza Split 09/11/2010   Influenza Whole 09/12/2011   Influenza, High Dose Seasonal PF 09/20/2012, 09/07/2017   Influenza, Seasonal, Injecte, Preservative Fre 09/08/2007, 09/26/2008, 10/02/2014, 09/18/2015   Influenza,inj,Quad PF,6+ Mos 09/19/2013, 09/21/2015   Influenza-Unspecified 09/07/2017, 09/11/2019, 09/03/2020, 10/19/2021, 10/26/2022   Moderna Sars-Covid-2 Vaccination 01/18/2019,  02/15/2019, 11/19/2019, 10/17/2020, 06/01/2021   PNEUMOCOCCAL CONJUGATE-20 12/13/2021   Pneumococcal Conjugate-13 09/19/2013   Pneumococcal Polysaccharide-23 09/26/2003, 06/28/2016   Td 08/04/1998   Tdap 06/04/2010, 08/15/2021   Tetanus 08/04/1998   Unspecified SARS-COV-2 Vaccination 06/01/2021, 09/30/2022   Zoster Recombinant(Shingrix) 06/29/2022   Zoster, Live 01/16/2004   Pertinent  Health Maintenance Due  Topic Date Due   FOOT EXAM  08/20/2022   HEMOGLOBIN A1C  05/07/2023   OPHTHALMOLOGY EXAM  06/01/2023   INFLUENZA VACCINE  Completed      12/05/2021   10:38 PM 12/06/2021    7:05 AM 12/06/2021    7:40 PM 12/07/2021    8:45 AM 06/28/2022    2:03 PM  Fall Risk  Falls in the past year?     1  Was there an injury with Fall?     1  Fall Risk Category Calculator     3  (RETIRED) Patient Fall Risk Level Moderate fall risk High fall risk High fall risk High fall risk   Patient at Risk for Falls Due to     History of fall(s);Impaired balance/gait;Impaired mobility;Impaired vision   Functional Status Survey:    Vitals:   12/15/22 1529  BP: 111/68  Pulse: 71  Resp: 12  Temp: 98.5 F (36.9 C)  SpO2: 98%  Weight: 163 lb 3.2 oz (74 kg)  Height: 5\' 10"  (1.778 m)   Body mass index is 23.42 kg/m. Physical Exam Constitutional:      General: He is not in acute distress.    Appearance: He is well-developed. He is not diaphoretic.  HENT:     Head: Normocephalic and atraumatic.     Right Ear: External ear normal.     Left Ear: External ear normal.     Mouth/Throat:     Pharynx: No oropharyngeal exudate.  Eyes:     Conjunctiva/sclera: Conjunctivae normal.     Pupils: Pupils are equal, round, and reactive to light.  Cardiovascular:     Rate and Rhythm: Normal rate and regular rhythm.     Heart sounds: Normal heart sounds.  Pulmonary:     Effort: Pulmonary effort is normal.     Breath sounds: Normal breath sounds.  Abdominal:     General: Bowel sounds are normal.      Palpations: Abdomen is soft.  Musculoskeletal:  General: No tenderness.     Cervical back: Normal range of motion and neck supple.     Right lower leg: No edema.     Left lower leg: No edema.  Skin:    General: Skin is warm and dry.  Neurological:     Mental Status: He is alert and oriented to person, place, and time.     Motor: Weakness present.     Gait: Gait abnormal.     Labs reviewed: Recent Labs    02/08/22 1438 02/14/22 0000 05/12/22 0000 11/07/22 0000  NA 134* 138 138 138  K 3.9 3.5 3.9 4.0  CL 96* 103 103 104  CO2 24 31* 30* 29*  GLUCOSE 281*  --   --   --   BUN 50* 37* 26* 38*  CREATININE 1.20 1.0 0.7 0.9  CALCIUM 10.0 9.8 10.3  --    Recent Labs    02/08/22 1438 05/12/22 0000 11/07/22 0000  AST 42* 13* 13*  ALT 25 12 13   ALKPHOS 77 55 63  BILITOT 1.0  --   --   PROT 6.6  --   --   ALBUMIN 2.9* 3.1* 3.4*   Recent Labs    02/08/22 1438 02/08/22 1438 02/14/22 0000 02/20/22 0000 02/24/22 0000 03/17/22 0000 03/24/22 0000 05/12/22 0000 11/07/22 0000  WBC 11.9*   < > 5.4 6.4 6.9   < > 9.3 6.9 5.2  NEUTROABS  --   --  3,440.00 4,736.00 4,720.00  --   --   --   --   HGB 12.5*  --  11.5* 10.7* 10.7*   < > 10.0* 10.6* 11.2*  HCT 39.0  --  34* 32* 33*   < > 30* 32* 35*  MCV 94.2  --   --   --   --   --   --   --   --   PLT 189  --  110* 143* 185   < > 176 144* 113*   < > = values in this interval not displayed.   Lab Results  Component Value Date   TSH 1.32 11/07/2022   Lab Results  Component Value Date   HGBA1C 6.4 11/07/2022   Lab Results  Component Value Date   CHOL 95 11/07/2022   HDL 59 11/07/2022   LDLCALC 27 11/07/2022   TRIG 31 (A) 11/07/2022   CHOLHDL 2 02/25/2021    Significant Diagnostic Results in last 30 days:  CUP PACEART REMOTE DEVICE CHECK Result Date: 12/15/2022 Scheduled remote reviewed. Normal device function.  Next remote 91 days. LA, CVRS   Assessment/Plan 1. Chronic diastolic heart failure (HCC)  (Primary) -euvolemic, was on lasix but this has been stopped and doing well.    2. Atrial fibrillation, chronic (HCC) -rate controlled  3. BPH with obstruction/lower urinary tract symptoms Stable on flomax and proscar  4. Major depressive disorder with single episode, in partial remission (HCC) -mood has been stable. Continues on zoloft.   5. Mixed hyperlipidemia LDL at goal on lipitor   6. Type 2 diabetes, controlled, with neuropathy (HCC) -A1c at goal. Cotinues on metformin BID  7. Constipation Stable on miralax     Demauri Advincula K. Biagio Borg [facility] [phone]

## 2022-12-15 NOTE — Progress Notes (Deleted)
Location:  Other [place].  Nursing Home Room Number: [facility] [address] Place of Service:  SNF [phone]) [name], NP  PCP: [name], MD  Patient Care Team: [name], MD as PCP - General (Family Medicine) [name], MD as PCP - Cardiology (Cardiology) [name], MD as Consulting Physician (Cardiology)  Extended Emergency Contact Information Primary Emergency Contact: [name] [facility]) Address: [address]          [place], [place] [address] [name] of [employer] Phone: [phone] Relation: Son  Goals of care: Advanced Directive information    12/15/2022    3:40 PM  Advanced Directives  Does Patient Have a Medical Advance Directive? Yes  Type of Estate agent of [place];Out of facility DNR (pink MOST or yellow form);Living will  Does patient want to make changes to medical advance directive? No - Patient declined  Copy of Healthcare Power of Attorney in Chart? Yes - validated most recent copy scanned in chart (See row information)     Chief Complaint  Patient presents with   Medical Management of Chronic Issues    Medical Management of Chronic Issues.     HPI:  Pt is a 89 y.o. male seen today for medical management of chronic disease.    Past Medical History:  Diagnosis Date   Asthma    Atrial flutter (HCC) 2007   Band keratopathy    Benign prostatic hypertrophy    Chronic ear infection    Chronic kidney disease    Chronic prostatitis    Dental bridge present    permanent - upper   Diabetes mellitus    Elbow stiffness, left    s/p fracture many yrs ago.  arm does not straighten   GERD (gastroesophageal reflux disease)    laryngeal involvement   Hyperlipidemia    Hypertension    Obstructive sleep apnea    CPAP-9   Osteoarthrosis, localized, primary, knee    post-traumatic   Prostatitis    Stroke (HCC)    TIA   Tachy-brady syndrome (HCC)    UTI (urinary tract infection)     Past Surgical History:  Procedure Laterality Date   APPENDECTOMY     BELPHAROPTOSIS REPAIR     Dr [name]---didn't resolve weepy eye and eyelid drooping   CARDIOVERSION  04/13/2010   CATARACT EXTRACTION, BILATERAL  2009   Chest pain  8/12   Stress test benign   ESOPHAGEAL DILATION  05/26/2016   Procedure: ESOPHAGEAL DILATION;  Surgeon: [name], MD;  Location: [facility];  Service: Endoscopy;;   ESOPHAGOGASTRODUODENOSCOPY (EGD) WITH PROPOFOL N/A 05/26/2016   Procedure: ESOPHAGOGASTRODUODENOSCOPY (EGD) WITH PROPOFOL;  Surgeon: [name], MD;  Location: [facility];  Service: Endoscopy;  Laterality: N/A;  Diabetic - oral meds sleep apnea   EYE SURGERY     FRACTURE SURGERY Left    elbow   KNEE SURGERY  1998   plate after fracture, then removed for infection   left elbow surgery     MASTOIDECTOMY  8/08   Dr [name]   PACEMAKER IMPLANT N/A 09/02/2021   Procedure: PACEMAKER IMPLANT;  Surgeon: [name], MD;  Location: [facility] INVASIVE CV LAB;  Service: Cardiovascular;  Laterality: N/A;   RHINOPLASTY  5/10   and septoplasty   SUBACROMIAL DECOMPRESSION Right 2005   Arthroscopic (for rotator cuff and biceps tendon ruptures)   TEAR DUCT PROBING  11/13   Dr [name]   TOTAL KNEE ARTHROPLASTY Left 09/01/2014  Procedure: TOTAL KNEE ARTHROPLASTY;  Surgeon: [name], MD;  Location: [facility] ORS;  Service: Orthopedics;  Laterality: Left;   TRIGGER FINGER RELEASE Left 02/25/2015   Procedure: LEFT LONG TRIGGER RELEASE;  Surgeon: [name], MD;  Location: [facility] ORS;  Service: Orthopedics;  Laterality: Left;   VENOUS ABLATION      Allergies  Allergen Reactions   Lovenox [Enoxaparin] Hives   Proscar [Finasteride] Other (See Comments)    Upper body and arm weakness   Latex Rash    Has trouble with BANDAIDS that have been left on for more than 24 hours. Prefers paper tape.   Nickel Rash    Localized rash   Percocet [Oxycodone-Acetaminophen] Rash   Tape Rash and  Other (See Comments)    Sensitivity     Outpatient Encounter Medications as of 12/15/2022  Medication Sig   acetaminophen (TYLENOL) 500 MG tablet Take 1,000 mg by mouth every 8 (eight) hours as needed.   apixaban (ELIQUIS) 2.5 MG TABS tablet Take 2.5 mg by mouth 2 (two) times daily.   APPLE CIDER VINEGAR PO Take 1 capsule by mouth at bedtime.   atorvastatin (LIPITOR) 80 MG tablet Take 1 tablet (80 mg total) by mouth daily.   Cranberry 250 MG TABS Take 1 tablet by mouth 2 (two) times daily.   Cyanocobalamin (B-12 PO) Take 1,000 mg by mouth daily.   finasteride (PROSCAR) 5 MG tablet Take 1 tablet (5 mg total) by mouth daily.   guaiFENesin (MUCINEX) 600 MG 12 hr tablet Take 600 mg by mouth every 8 (eight) hours as needed.   guaiFENesin (TUSSIN PO) Take 10 mLs by mouth every 4 (four) hours as needed.   lactose free nutrition (BOOST) LIQD Take 237 mLs by mouth 3 (three) times daily between meals.   lidocaine (LIDODERM) 5 % Place 1 patch onto the skin daily. Remove & Discard patch within 12 hours or as directed by MD   Magnesium Oxide 400 MG CAPS Take 1 capsule (400 mg total) by mouth daily.   metFORMIN (GLUCOPHAGE) 500 MG tablet TAKE 1 TABLET BY MOUTH  TWICE DAILY WITH MEALS   mineral oil-hydrophilic petrolatum (AQUAPHOR) ointment Apply 1 Application topically daily as needed for dry skin. To left lower leg   Polyethyl Glycol-Propyl Glycol (SYSTANE ULTRA OP) Place 1 drop into both eyes daily as needed (dry eyes).   polyethylene glycol (MIRALAX / GLYCOLAX) 17 g packet Take 17 g by mouth. Daily every Monday, Wednesday and Friday.   saccharomyces boulardii (FLORASTOR) 250 MG capsule Take 250 mg by mouth daily.   sertraline (ZOLOFT) 50 MG tablet Take 50 mg by mouth daily.   sodium chloride (OCEAN) 0.65 % SOLN nasal spray Place 2 sprays into both nostrils 2 (two) times daily as needed for congestion.   Sodium Fluoride (PREVIDENT 5000 BOOSTER PLUS) 1.1 % PSTE Place 1 application  onto teeth at bedtime  as needed.   tamsulosin (FLOMAX) 0.4 MG CAPS capsule Take 2 capsules (0.8 mg total) by mouth at bedtime.   [DISCONTINUED] Wheat Dextrin (BENEFIBER) POWD Give 2 tsp by mouth in the morning for Bowel Regulation mix in 8oz liquid of resident's choice   [DISCONTINUED] Zinc Oxide (TRIPLE PASTE) 12.8 % ointment Apply 1 Application topically. Every shift to bilateral buttocks.   No facility-administered encounter medications on file as of 12/15/2022.    Review of Systems  Constitutional:  Negative for activity change, appetite change, fatigue and unexpected weight change.  HENT:  Negative for congestion and hearing  loss.   Eyes: Negative.   Respiratory:  Negative for cough and shortness of breath.   Cardiovascular:  Negative for chest pain, palpitations and leg swelling.  Gastrointestinal:  Negative for abdominal pain, constipation and diarrhea.  Genitourinary:  Negative for difficulty urinating and dysuria.  Musculoskeletal:  Negative for arthralgias and myalgias.  Skin:  Negative for color change and wound.  Neurological:  Positive for weakness. Negative for dizziness.  Psychiatric/Behavioral:  Negative for agitation, behavioral problems and confusion.      Immunization History  Administered Date(s) Administered   Fluad Quad(high Dose 65+) 09/14/2018   H1N1 12/13/2007   Influenza Split 09/11/2010   Influenza Whole 09/12/2011   Influenza, High Dose Seasonal PF 09/20/2012, 09/07/2017   Influenza, Seasonal, Injecte, Preservative Fre 09/08/2007, 09/26/2008, 10/02/2014, 09/18/2015   Influenza,inj,Quad PF,6+ Mos 09/19/2013, 09/21/2015   Influenza-Unspecified 09/07/2017, 09/11/2019, 09/03/2020, 10/19/2021, 10/26/2022   Moderna Sars-Covid-2 Vaccination 01/18/2019, 02/15/2019, 11/19/2019, 10/17/2020, 06/01/2021   PNEUMOCOCCAL CONJUGATE-20 12/13/2021   Pneumococcal Conjugate-13 09/19/2013   Pneumococcal Polysaccharide-23 09/26/2003, 06/28/2016   Td 08/04/1998   Tdap 06/04/2010, 08/15/2021    Tetanus 08/04/1998   Unspecified SARS-COV-2 Vaccination 06/01/2021, 09/30/2022   Zoster Recombinant(Shingrix) 06/29/2022   Zoster, Live 01/16/2004   Pertinent  Health Maintenance Due  Topic Date Due   FOOT EXAM  08/20/2022   HEMOGLOBIN A1C  05/07/2023   OPHTHALMOLOGY EXAM  06/01/2023   INFLUENZA VACCINE  Completed      12/05/2021   10:38 PM 12/06/2021    7:05 AM 12/06/2021    7:40 PM 12/07/2021    8:45 AM 06/28/2022    2:03 PM  Fall Risk  Falls in the past year?     1  Was there an injury with Fall?     1  Fall Risk Category Calculator     3  (RETIRED) Patient Fall Risk Level Moderate fall risk High fall risk High fall risk High fall risk   Patient at Risk for Falls Due to     History of fall(s);Impaired balance/gait;Impaired mobility;Impaired vision   Functional Status Survey:    Vitals:   12/15/22 1529  BP: 111/68  Pulse: 71  Resp: 12  Temp: 98.5 F (36.9 C)  SpO2: 98%  Weight: 163 lb 3.2 oz (74 kg)  Height: 5\' 10"  (1.778 m)   Body mass index is 23.42 kg/m. Physical Exam Constitutional:      General: He is not in acute distress.    Appearance: He is well-developed. He is not diaphoretic.  HENT:     Head: Normocephalic and atraumatic.     Right Ear: External ear normal.     Left Ear: External ear normal.     Mouth/Throat:     Pharynx: No oropharyngeal exudate.  Eyes:     Conjunctiva/sclera: Conjunctivae normal.     Pupils: Pupils are equal, round, and reactive to light.  Cardiovascular:     Rate and Rhythm: Normal rate and regular rhythm.     Heart sounds: Normal heart sounds.  Pulmonary:     Effort: Pulmonary effort is normal.     Breath sounds: Normal breath sounds.  Abdominal:     General: Bowel sounds are normal.     Palpations: Abdomen is soft.  Musculoskeletal:        General: No tenderness.     Cervical back: Normal range of motion and neck supple.     Right lower leg: No edema.     Left lower leg: No edema.  Skin:  General: Skin is warm and  dry.  Neurological:     Mental Status: He is alert and oriented to person, place, and time.     Motor: Weakness present.     Gait: Gait abnormal.   ***  Labs reviewed: Recent Labs    02/08/22 1438 02/14/22 0000 05/12/22 0000 11/07/22 0000  NA 134* 138 138 138  K 3.9 3.5 3.9 4.0  CL 96* 103 103 104  CO2 24 31* 30* 29*  GLUCOSE 281*  --   --   --   BUN 50* 37* 26* 38*  CREATININE 1.20 1.0 0.7 0.9  CALCIUM 10.0 9.8 10.3  --    Recent Labs    02/08/22 1438 05/12/22 0000 11/07/22 0000  AST 42* 13* 13*  ALT 25 12 13   ALKPHOS 77 55 63  BILITOT 1.0  --   --   PROT 6.6  --   --   ALBUMIN 2.9* 3.1* 3.4*   Recent Labs    02/08/22 1438 02/08/22 1438 02/14/22 0000 02/20/22 0000 02/24/22 0000 03/17/22 0000 03/24/22 0000 05/12/22 0000 11/07/22 0000  WBC 11.9*   < > 5.4 6.4 6.9   < > 9.3 6.9 5.2  NEUTROABS  --   --  3,440.00 4,736.00 4,720.00  --   --   --   --   HGB 12.5*  --  11.5* 10.7* 10.7*   < > 10.0* 10.6* 11.2*  HCT 39.0  --  34* 32* 33*   < > 30* 32* 35*  MCV 94.2  --   --   --   --   --   --   --   --   PLT 189  --  110* 143* 185   < > 176 144* 113*   < > = values in this interval not displayed.   Lab Results  Component Value Date   TSH 1.32 11/07/2022   Lab Results  Component Value Date   HGBA1C 6.4 11/07/2022   Lab Results  Component Value Date   CHOL 95 11/07/2022   HDL 59 11/07/2022   LDLCALC 27 11/07/2022   TRIG 31 (A) 11/07/2022   CHOLHDL 2 02/25/2021    Significant Diagnostic Results in last 30 days:  CUP PACEART REMOTE DEVICE CHECK Result Date: 12/15/2022 Scheduled remote reviewed. Normal device function.  Next remote 91 days. LA, CVRS   Assessment/Plan 1. Chronic diastolic heart failure (HCC) (Primary) ***  2. Atrial fibrillation, chronic (HCC) ***  3. BPH with obstruction/lower urinary tract symptoms ***  4. Major depressive disorder with single episode, in partial remission (HCC) ***  5. Mixed  hyperlipidemia ***    Cienna Dumais K. Biagio Borg [facility] [phone]

## 2023-01-09 ENCOUNTER — Encounter: Payer: Self-pay | Admitting: Student

## 2023-01-09 ENCOUNTER — Non-Acute Institutional Stay (SKILLED_NURSING_FACILITY): Payer: Medicare Other | Admitting: Student

## 2023-01-09 DIAGNOSIS — K08409 Partial loss of teeth, unspecified cause, unspecified class: Secondary | ICD-10-CM | POA: Diagnosis not present

## 2023-01-09 DIAGNOSIS — Z9189 Other specified personal risk factors, not elsewhere classified: Secondary | ICD-10-CM

## 2023-01-09 NOTE — Progress Notes (Signed)
 Location:      Place of Service:    Provider:  Abdul Abdul Fine, MD  Patient Care Team: Abdul Fine, MD as PCP - General (Family Medicine) Perla Evalene PARAS, MD as PCP - Cardiology (Cardiology) Perla Evalene PARAS, MD as Consulting Physician (Cardiology)  Extended Emergency Contact Information Primary Emergency Contact: [facility]) Address: [address]          [place], [place] [address] [place] Phone: [phone] Relation: Son  Code Status:  DNR Goals of care: Advanced Directive information    12/15/2022    3:40 PM  Advanced Directives  Does Patient Have a Medical Advance Directive? Yes  Type of Estate Agent of [place];Out of facility DNR (pink MOST or yellow form);Living will  Does patient want to make changes to medical advance directive? No - Patient declined  Copy of Healthcare Power of Attorney in Chart? Yes - validated most recent copy scanned in chart (See row information)     No chief complaint on file.   HPI:  Discussed the use of AI scribe software for clinical note transcription with the patient, who gave verbal consent to proceed.  History of Present Illness         Pt is a 89 y.o. male seen today for an acute visit for The patient, with a history of dental issues, presents with a persistent oral complaint described as 'gloppy goo' in the mouth, particularly when sleeping with an open mouth. They have been experiencing this issue for over a year since their admission to the current care facility.  The patient also reports a significant dental problem involving two deep socket teeth that were extracted. The extraction sites have been continuously bleeding for over two months, despite the application of cotton wads or tea bags for pressure. The patient expresses dissatisfaction with the dental care received, describing the dentist as 'horseradish' and considering legal action due to the  prolonged bleeding and discomfort.  Oral hygiene practices are limited due to the ongoing bleeding issue. The patient acknowledges the need for tooth brushing but is unable to perform this due to the dental condition. They are also supposed to gargle with salty water and a heavy fluoride prescriptive gargle, but report difficulty in coordinating these tasks.  The patient has been suggested to use a chin strap to possibly alleviate the 'gloppy goo' issue, although they are unsure of the material and effectiveness of such a device. They express a willingness to trial this intervention.  Despite these ongoing issues, the patient remains active, engaging in walking exercises around the facility.   Past Medical History:  Diagnosis Date   Asthma    Atrial flutter (HCC) 2007   Band keratopathy    Benign prostatic hypertrophy    Chronic ear infection    Chronic kidney disease    Chronic prostatitis    Dental bridge present    permanent - upper   Diabetes mellitus    Elbow stiffness, left    s/p fracture many yrs ago.  arm does not straighten   GERD (gastroesophageal reflux disease)    laryngeal involvement   Hyperlipidemia    Hypertension    Obstructive sleep apnea    CPAP-9   Osteoarthrosis, localized, primary, knee    post-traumatic   Prostatitis    Stroke [facility])    TIA   Tachy-brady syndrome (HCC)    UTI (urinary tract infection)    Past Surgical History:  Procedure Laterality  Date   APPENDECTOMY     BELPHAROPTOSIS REPAIR     Dr [name]---didn't resolve weepy eye and eyelid drooping   CARDIOVERSION  04/13/2010   CATARACT EXTRACTION, BILATERAL  2009   Chest pain  8/12   Stress test benign   ESOPHAGEAL DILATION  05/26/2016   Procedure: ESOPHAGEAL DILATION;  Surgeon: Jinny Carmine, MD;  Location: [facility];  Service: Endoscopy;;   ESOPHAGOGASTRODUODENOSCOPY (EGD) WITH PROPOFOL  N/A 05/26/2016   Procedure: ESOPHAGOGASTRODUODENOSCOPY (EGD) WITH PROPOFOL ;  Surgeon: Jinny Carmine, MD;  Location: [facility];  Service: Endoscopy;  Laterality: N/A;  Diabetic - oral meds sleep apnea   EYE SURGERY     FRACTURE SURGERY Left    elbow   KNEE SURGERY  1998   plate after fracture, then removed for infection   left elbow surgery     MASTOIDECTOMY  8/08   Dr Rena   PACEMAKER IMPLANT N/A 09/02/2021   Procedure: PACEMAKER IMPLANT;  Surgeon: Cindie Ole DASEN, MD;  Location: [facility] INVASIVE CV LAB;  Service: Cardiovascular;  Laterality: N/A;   RHINOPLASTY  5/10   and septoplasty   SUBACROMIAL DECOMPRESSION Right 2005   Arthroscopic (for rotator cuff and biceps tendon ruptures)   TEAR DUCT PROBING  11/13   Dr Ashley   TOTAL KNEE ARTHROPLASTY Left 09/01/2014   Procedure: TOTAL KNEE ARTHROPLASTY;  Surgeon: Lynwood SHAUNNA Hue, MD;  Location: [facility] ORS;  Service: Orthopedics;  Laterality: Left;   TRIGGER FINGER RELEASE Left 02/25/2015   Procedure: LEFT LONG TRIGGER RELEASE;  Surgeon: Lynwood SHAUNNA Hue, MD;  Location: [facility] ORS;  Service: Orthopedics;  Laterality: Left;   VENOUS ABLATION      Allergies  Allergen Reactions   Lovenox [Enoxaparin] Hives   Proscar  [Finasteride ] Other (See Comments)    Upper body and arm weakness   Latex Rash    Has trouble with BANDAIDS that have been left on for more than 24 hours. Prefers paper tape.   Nickel Rash    Localized rash   Percocet [Oxycodone-Acetaminophen ] Rash   Tape Rash and Other (See Comments)    Sensitivity     Outpatient Encounter Medications as of 01/09/2023  Medication Sig   acetaminophen  (TYLENOL ) 500 MG tablet Take 1,000 mg by mouth every 8 (eight) hours as needed.   apixaban  (ELIQUIS ) 2.5 MG TABS tablet Take 2.5 mg by mouth 2 (two) times daily.   APPLE CIDER VINEGAR PO Take 1 capsule by mouth at bedtime.   atorvastatin  (LIPITOR ) 80 MG tablet Take 1 tablet (80 mg total) by mouth daily.   Cranberry 250 MG TABS Take 1 tablet by mouth 2 (two) times daily.   Cyanocobalamin  (B-12 PO) Take 1,000 mg by mouth daily.    finasteride  (PROSCAR ) 5 MG tablet Take 1 tablet (5 mg total) by mouth daily.   guaiFENesin  (MUCINEX ) 600 MG 12 hr tablet Take 600 mg by mouth every 8 (eight) hours as needed.   guaiFENesin  (TUSSIN PO) Take 10 mLs by mouth every 4 (four) hours as needed.   lactose free nutrition (BOOST) LIQD Take 237 mLs by mouth 3 (three) times daily between meals.   lidocaine  (LIDODERM ) 5 % Place 1 patch onto the skin daily. Remove & Discard patch within 12 hours or as directed by MD   Magnesium  Oxide 400 MG CAPS Take 1 capsule (400 mg total) by mouth daily.   metFORMIN  (GLUCOPHAGE ) 500 MG tablet TAKE 1 TABLET BY MOUTH  TWICE DAILY WITH MEALS   mineral oil-hydrophilic petrolatum (AQUAPHOR) ointment  Apply 1 Application topically daily as needed for dry skin. To left lower leg   Polyethyl Glycol-Propyl Glycol (SYSTANE ULTRA OP) Place 1 drop into both eyes daily as needed (dry eyes).   polyethylene glycol (MIRALAX  / GLYCOLAX ) 17 g packet Take 17 g by mouth. Daily every Monday, Wednesday and Friday.   saccharomyces boulardii (FLORASTOR) 250 MG capsule Take 250 mg by mouth daily.   sertraline (ZOLOFT) 50 MG tablet Take 50 mg by mouth daily.   sodium chloride  (OCEAN) 0.65 % SOLN nasal spray Place 2 sprays into both nostrils 2 (two) times daily as needed for congestion.   Sodium Fluoride (PREVIDENT 5000 BOOSTER PLUS) 1.1 % PSTE Place 1 application  onto teeth at bedtime as needed.   tamsulosin  (FLOMAX ) 0.4 MG CAPS capsule Take 2 capsules (0.8 mg total) by mouth at bedtime.   No facility-administered encounter medications on file as of 01/09/2023.    Review of Systems  Immunization History  Administered Date(s) Administered   Fluad Quad(high Dose 65+) 09/14/2018   H1N1 12/13/2007   Influenza Split 09/11/2010   Influenza Whole 09/12/2011   Influenza, High Dose Seasonal PF 09/20/2012, 09/07/2017   Influenza, Seasonal, Injecte, Preservative Fre 09/08/2007, 09/26/2008, 10/02/2014, 09/18/2015   Influenza,inj,Quad  PF,6+ Mos 09/19/2013, 09/21/2015   Influenza-Unspecified 09/07/2017, 09/11/2019, 09/03/2020, 10/19/2021, 10/26/2022   Moderna Sars-Covid-2 Vaccination 01/18/2019, 02/15/2019, 11/19/2019, 10/17/2020, 06/01/2021   PNEUMOCOCCAL CONJUGATE-20 12/13/2021   Pneumococcal Conjugate-13 09/19/2013   Pneumococcal Polysaccharide-23 09/26/2003, 06/28/2016   Td 08/04/1998   Tdap 06/04/2010, 08/15/2021   Tetanus 08/04/1998   Unspecified SARS-COV-2 Vaccination 06/01/2021, 09/30/2022   Zoster Recombinant(Shingrix) 06/29/2022   Zoster, Live 01/16/2004   Pertinent  Health Maintenance Due  Topic Date Due   FOOT EXAM  08/20/2022   HEMOGLOBIN A1C  05/07/2023   OPHTHALMOLOGY EXAM  06/01/2023   INFLUENZA VACCINE  Completed      12/05/2021   10:38 PM 12/06/2021    7:05 AM 12/06/2021    7:40 PM 12/07/2021    8:45 AM 06/28/2022    2:03 PM  Fall Risk  Falls in the past year?     1  Was there an injury with Fall?     1  Fall Risk Category Calculator     3  (RETIRED) Patient Fall Risk Level Moderate fall risk High fall risk High fall risk High fall risk   Patient at Risk for Falls Due to     History of fall(s);Impaired balance/gait;Impaired mobility;Impaired vision   Functional Status Survey:    There were no vitals filed for this visit. There is no height or weight on file to calculate BMI. Physical Exam Poor dentition with poor gingival hygiene.   Labs reviewed: Recent Labs    02/08/22 1438 02/14/22 0000 05/12/22 0000 11/07/22 0000  NA 134* 138 138 138  K 3.9 3.5 3.9 4.0  CL 96* 103 103 104  CO2 24 31* 30* 29*  GLUCOSE 281*  --   --   --   BUN 50* 37* 26* 38*  CREATININE 1.20 1.0 0.7 0.9  CALCIUM  10.0 9.8 10.3  --    Recent Labs    02/08/22 1438 05/12/22 0000 11/07/22 0000  AST 42* 13* 13*  ALT 25 12 13   ALKPHOS 77 55 63  BILITOT 1.0  --   --   PROT 6.6  --   --   ALBUMIN 2.9* 3.1* 3.4*   Recent Labs    02/08/22 1438 02/08/22 1438 02/14/22 0000 02/20/22 0000 02/24/22 0000  03/17/22 0000 03/24/22 0000 05/12/22 0000 11/07/22 0000  WBC 11.9*   < > 5.4 6.4 6.9   < > 9.3 6.9 5.2  NEUTROABS  --   --  3,440.00 4,736.00 4,720.00  --   --   --   --   HGB 12.5*  --  11.5* 10.7* 10.7*   < > 10.0* 10.6* 11.2*  HCT 39.0  --  34* 32* 33*   < > 30* 32* 35*  MCV 94.2  --   --   --   --   --   --   --   --   PLT 189  --  110* 143* 185   < > 176 144* 113*   < > = values in this interval not displayed.   Lab Results  Component Value Date   TSH 1.32 11/07/2022   Lab Results  Component Value Date   HGBA1C 6.4 11/07/2022   Lab Results  Component Value Date   CHOL 95 11/07/2022   HDL 59 11/07/2022   LDLCALC 27 11/07/2022   TRIG 31 (A) 11/07/2022   CHOLHDL 2 02/25/2021    Significant Diagnostic Results in last 30 days:  CUP PACEART REMOTE DEVICE CHECK Result Date: 12/15/2022 Scheduled remote reviewed. Normal device function.  Next remote 91 days. LA, CVRS   Assessment/Plan Oral Health Complaints of gloppy goo in mouth, possibly related to poor oral hygiene due to inability to brush teeth secondary to ongoing oral bleeding from recent dental extractions. Patient expresses dissatisfaction with dental care received. -Order chin strap as requested by patient for trial use, despite lack of strong evidence for efficacy. -Ensure patient is performing saline rinses twice daily as previously ordered. -Consider addition of a high fluoride prescriptive gargle.  Post-Extraction Bleeding Ongoing bleeding from extraction sites of two deep socket teeth, causing difficulty with oral hygiene practices. Patient reports dissatisfaction with dental care. -Continue current management of bleeding with packing and pressure. -Consider consultation with a different dental provider for further evaluation and management.  Family/ staff Communication: nursing  Labs/tests ordered:  none

## 2023-01-19 NOTE — Progress Notes (Signed)
Remote pacemaker transmission.   

## 2023-01-19 NOTE — Addendum Note (Signed)
Addended by: Geralyn Flash D on: 01/19/2023 03:27 PM   Modules accepted: Orders

## 2023-02-08 DIAGNOSIS — B351 Tinea unguium: Secondary | ICD-10-CM | POA: Diagnosis not present

## 2023-02-08 DIAGNOSIS — E1159 Type 2 diabetes mellitus with other circulatory complications: Secondary | ICD-10-CM | POA: Diagnosis not present

## 2023-02-08 LAB — HM DIABETES FOOT EXAM

## 2023-02-16 DIAGNOSIS — Z9181 History of falling: Secondary | ICD-10-CM | POA: Diagnosis not present

## 2023-02-16 DIAGNOSIS — R2689 Other abnormalities of gait and mobility: Secondary | ICD-10-CM | POA: Diagnosis not present

## 2023-02-16 DIAGNOSIS — S42201D Unspecified fracture of upper end of right humerus, subsequent encounter for fracture with routine healing: Secondary | ICD-10-CM | POA: Diagnosis not present

## 2023-02-17 ENCOUNTER — Non-Acute Institutional Stay (SKILLED_NURSING_FACILITY): Payer: Medicare Other | Admitting: Student

## 2023-02-17 ENCOUNTER — Encounter: Payer: Self-pay | Admitting: Student

## 2023-02-17 DIAGNOSIS — I10 Essential (primary) hypertension: Secondary | ICD-10-CM | POA: Diagnosis not present

## 2023-02-17 DIAGNOSIS — E1140 Type 2 diabetes mellitus with diabetic neuropathy, unspecified: Secondary | ICD-10-CM | POA: Diagnosis not present

## 2023-02-17 DIAGNOSIS — I4892 Unspecified atrial flutter: Secondary | ICD-10-CM

## 2023-02-17 DIAGNOSIS — Z95811 Presence of heart assist device: Secondary | ICD-10-CM

## 2023-02-17 DIAGNOSIS — I442 Atrioventricular block, complete: Secondary | ICD-10-CM

## 2023-02-17 DIAGNOSIS — I700 Atherosclerosis of aorta: Secondary | ICD-10-CM | POA: Diagnosis not present

## 2023-02-17 DIAGNOSIS — I872 Venous insufficiency (chronic) (peripheral): Secondary | ICD-10-CM | POA: Diagnosis not present

## 2023-02-17 DIAGNOSIS — I482 Chronic atrial fibrillation, unspecified: Secondary | ICD-10-CM

## 2023-02-17 DIAGNOSIS — I5032 Chronic diastolic (congestive) heart failure: Secondary | ICD-10-CM

## 2023-02-17 DIAGNOSIS — E782 Mixed hyperlipidemia: Secondary | ICD-10-CM | POA: Diagnosis not present

## 2023-02-17 NOTE — Progress Notes (Signed)
 Location:  [facility] [place].  Nursing Home Room Number: [facility] [address] Place of Service:  SNF [id number]) Provider: [name], MD  Patient Care Team: [name], MD as PCP - General (Family Medicine) Antonieta Iba, MD as PCP - Cardiology (Cardiology) Antonieta Iba, MD as Consulting Physician (Cardiology)  Extended Emergency Contact Information Primary Emergency Contact: [name] [facility]) Address: [address]          [place], [place] [address] [name] Phone: [phone] Relation: Son  Code Status:  DNR Goals of care: Advanced Directive information    02/17/2023    3:19 PM  Advanced Directives  Does Patient Have a Medical Advance Directive? Yes  Type of Estate agent of [place];Out of facility DNR (pink MOST or yellow form);Living will  Does patient want to make changes to medical advance directive? No - Patient declined  Copy of Healthcare Power of Attorney in Chart? Yes - validated most recent copy scanned in chart (See row information)     Chief Complaint  Patient presents with   Medical Management of Chronic Issues    Medical Management of Chronic Issues.     HPI:  Pt is a 89 y.o. male seen today for medical management of chronic diseases.   History of Present Illness The patient presents for a follow-up visit regarding mood and dietary habits.  Mood has been stable, described as 'all right'. He has a history of depression and is currently taking sertraline 50 mg. Mirtazapine was discontinued in the last six months.  He follows dietary restrictions, avoiding foods that are too salty or spicy as they cause vomiting. He has experienced significant weight loss, previously weighing 239 pounds and now weighing 178 pounds. He ensures adequate nutrition by having snacks and drinking protein shakes.  He has a history of atrial fibrillation and is on Eliquis 2.5 mg. No changes in his medication regimen have  been indicated.  Type 2 diabetes is managed with metformin. His hemoglobin A1c was 6.4 as of November 5th, indicating good glycemic control.  Hyperlipidemia is managed with atorvastatin 80 mg. Cholesterol levels are at goal.  He has chronic anemia with a hemoglobin level of 11.2. He also takes magnesium supplements.  He reports some swelling in his legs but has not used compression wraps in the last year or two.  Past Medical History - History of depression - History of atrial fibrillation - History of type 2 diabetes - History of cardiovascular disease - History of chronic anemia - History of BPH - History of hyperlipidemia  Medications - Sertraline 150 mg - Eliquis 2.5 mg - Metformin - Atorvastatin 80 mg - Magnesium - Flomax  Physical Exam VITALS: T- 96.2, P- 69, BP- 113/71, RR- 20, SaO2- 97% MEASUREMENTS: Weight- 171.2. Gen: Thin, frail in wheelchair CHEST: Lungs clear to auscultation bilaterally. CARDIOVASCULAR: Heart rate normal. EXTREMITIES: Mild edema in legs.  Results LABS Hb: 11.2 TSH: 1.32 HbA1c: 6.4 (11/08/2022)  Past Medical History:  Diagnosis Date   Asthma    Atrial flutter (HCC) 2007   Band keratopathy    Benign prostatic hypertrophy    Chronic ear infection    Chronic kidney disease    Chronic prostatitis    Dental bridge present    permanent - upper   Diabetes mellitus    Elbow stiffness, left    s/p fracture many yrs ago.  arm does not straighten   GERD (gastroesophageal reflux disease)    laryngeal involvement  Hyperlipidemia    Hypertension    Obstructive sleep apnea    CPAP-9   Osteoarthrosis, localized, primary, knee    post-traumatic   Prostatitis    Stroke [facility])    TIA   Tachy-brady syndrome (HCC)    UTI (urinary tract infection)    Past Surgical History:  Procedure Laterality Date   APPENDECTOMY     BELPHAROPTOSIS REPAIR     Dr [name]---didn't resolve weepy eye and eyelid drooping   CARDIOVERSION  04/13/2010    CATARACT EXTRACTION, BILATERAL  2009   Chest pain  8/12   Stress test benign   ESOPHAGEAL DILATION  05/26/2016   Procedure: ESOPHAGEAL DILATION;  Surgeon: [name], MD;  Location: [facility];  Service: Endoscopy;;   ESOPHAGOGASTRODUODENOSCOPY (EGD) WITH PROPOFOL N/A 05/26/2016   Procedure: ESOPHAGOGASTRODUODENOSCOPY (EGD) WITH PROPOFOL;  Surgeon: [name], MD;  Location: [facility];  Service: Endoscopy;  Laterality: N/A;  Diabetic - oral meds sleep apnea   EYE SURGERY     FRACTURE SURGERY Left    elbow   KNEE SURGERY  1998   plate after fracture, then removed for infection   left elbow surgery     MASTOIDECTOMY  8/08   Dr [name]   PACEMAKER IMPLANT N/A 09/02/2021   Procedure: PACEMAKER IMPLANT;  Surgeon: [name], MD;  Location: [facility] INVASIVE CV LAB;  Service: Cardiovascular;  Laterality: N/A;   RHINOPLASTY  5/10   and septoplasty   SUBACROMIAL DECOMPRESSION Right 2005   Arthroscopic (for rotator cuff and biceps tendon ruptures)   TEAR DUCT PROBING  11/13   Dr [name]   TOTAL KNEE ARTHROPLASTY Left 09/01/2014   Procedure: TOTAL KNEE ARTHROPLASTY;  Surgeon: [name], MD;  Location: [facility] ORS;  Service: Orthopedics;  Laterality: Left;   TRIGGER FINGER RELEASE Left 02/25/2015   Procedure: LEFT LONG TRIGGER RELEASE;  Surgeon: [name], MD;  Location: [facility] ORS;  Service: Orthopedics;  Laterality: Left;   VENOUS ABLATION      Allergies  Allergen Reactions   Lovenox [Enoxaparin] Hives   Proscar [Finasteride] Other (See Comments)    Upper body and arm weakness   Latex Rash    Has trouble with BANDAIDS that have been left on for more than 24 hours. Prefers paper tape.   Nickel Rash    Localized rash   Percocet [Oxycodone-Acetaminophen] Rash   Tape Rash and Other (See Comments)    Sensitivity     Outpatient Encounter Medications as of 02/17/2023  Medication Sig   acetaminophen (TYLENOL) 500 MG tablet Take 1,000 mg by mouth every 8 (eight)  hours as needed.   apixaban (ELIQUIS) 2.5 MG TABS tablet Take 2.5 mg by mouth 2 (two) times daily.   APPLE CIDER VINEGAR PO Take 1 capsule by mouth at bedtime.   atorvastatin (LIPITOR) 80 MG tablet Take 1 tablet (80 mg total) by mouth daily.   Cranberry 250 MG TABS Take 1 tablet by mouth 2 (two) times daily.   Cyanocobalamin (B-12 PO) Take 1,000 mg by mouth daily.   finasteride (PROSCAR) 5 MG tablet Take 1 tablet (5 mg total) by mouth daily.   guaiFENesin (MUCINEX) 600 MG 12 hr tablet Take 600 mg by mouth every 8 (eight) hours as needed.   guaiFENesin (TUSSIN PO) Take 10 mLs by mouth every 4 (four) hours as needed.   lactose free nutrition (BOOST) LIQD Take 237 mLs by mouth 3 (three) times daily between meals.   lidocaine (LIDODERM) 5 % Place  1 patch onto the skin daily. Remove & Discard patch within 12 hours or as directed by MD   Magnesium Oxide 400 MG CAPS Take 1 capsule (400 mg total) by mouth daily.   metFORMIN (GLUCOPHAGE) 500 MG tablet TAKE 1 TABLET BY MOUTH  TWICE DAILY WITH MEALS   mineral oil-hydrophilic petrolatum (AQUAPHOR) ointment Apply 1 Application topically daily as needed for dry skin. To left lower leg   Polyethyl Glycol-Propyl Glycol (SYSTANE ULTRA OP) Place 1 drop into both eyes daily as needed (dry eyes).   polyethylene glycol (MIRALAX / GLYCOLAX) 17 g packet Take 17 g by mouth. Daily every Monday, Wednesday and Friday.   saccharomyces boulardii (FLORASTOR) 250 MG capsule Take 250 mg by mouth daily.   sertraline (ZOLOFT) 50 MG tablet Take 50 mg by mouth daily.   sodium chloride (OCEAN) 0.65 % SOLN nasal spray Place 2 sprays into both nostrils 2 (two) times daily as needed for congestion.   Sodium Fluoride (PREVIDENT 5000 BOOSTER PLUS) 1.1 % PSTE Place 1 application  onto teeth at bedtime as needed.   tamsulosin (FLOMAX) 0.4 MG CAPS capsule Take 2 capsules (0.8 mg total) by mouth at bedtime.   No facility-administered encounter medications on file as of 02/17/2023.     Review of Systems  Immunization History  Administered Date(s) Administered   Fluad Quad(high Dose 65+) 09/14/2018   H1N1 12/13/2007   Influenza Split 09/11/2010   Influenza Whole 09/12/2011   Influenza, High Dose Seasonal PF 09/20/2012, 09/07/2017   Influenza, Seasonal, Injecte, Preservative Fre 09/08/2007, 09/26/2008, 10/02/2014, 09/18/2015   Influenza,inj,Quad PF,6+ Mos 09/19/2013, 09/21/2015   Influenza-Unspecified 09/07/2017, 09/11/2019, 09/03/2020, 10/19/2021, 10/26/2022   Moderna Sars-Covid-2 Vaccination 01/18/2019, 02/15/2019, 11/19/2019, 10/17/2020, 06/01/2021   PNEUMOCOCCAL CONJUGATE-20 12/13/2021   Pneumococcal Conjugate-13 09/19/2013   Pneumococcal Polysaccharide-23 09/26/2003, 06/28/2016   Td 08/04/1998   Tdap 06/04/2010, 08/15/2021   Tetanus 08/04/1998   Unspecified SARS-COV-2 Vaccination 06/01/2021, 09/30/2022   Zoster Recombinant(Shingrix) 06/29/2022   Zoster, Live 01/16/2004   Pertinent  Health Maintenance Due  Topic Date Due   FOOT EXAM  08/20/2022   HEMOGLOBIN A1C  05/07/2023   OPHTHALMOLOGY EXAM  06/01/2023   INFLUENZA VACCINE  Completed      12/05/2021   10:38 PM 12/06/2021    7:05 AM 12/06/2021    7:40 PM 12/07/2021    8:45 AM 06/28/2022    2:03 PM  Fall Risk  Falls in the past year?     1  Was there an injury with Fall?     1  Fall Risk Category Calculator     3  (RETIRED) Patient Fall Risk Level Moderate fall risk High fall risk High fall risk High fall risk   Patient at Risk for Falls Due to     History of fall(s);Impaired balance/gait;Impaired mobility;Impaired vision   Functional Status Survey:    Vitals:   02/17/23 1505  BP: 113/71  Pulse: 69  Resp: 20  Temp: (!) 96.2 F (35.7 C)  SpO2: 97%  Weight: 171 lb 3.2 oz (77.7 kg)  Height: 5\' 10"  (1.778 m)   Body mass index is 24.56 kg/m. Physical Exam  Labs reviewed: Recent Labs    05/12/22 0000 11/07/22 0000  NA 138 138  K 3.9 4.0  CL 103 104  CO2 30* 29*  BUN 26* 38*   CREATININE 0.7 0.9  CALCIUM 10.3  --    Recent Labs    05/12/22 0000 11/07/22 0000  AST 13* 13*  ALT 12 13  ALKPHOS 55 63  ALBUMIN 3.1* 3.4*   Recent Labs    02/20/22 0000 02/24/22 0000 03/17/22 0000 03/24/22 0000 05/12/22 0000 11/07/22 0000  WBC 6.4 6.9   < > 9.3 6.9 5.2  NEUTROABS 4,736.00 4,720.00  --   --   --   --   HGB 10.7* 10.7*   < > 10.0* 10.6* 11.2*  HCT 32* 33*   < > 30* 32* 35*  PLT 143* 185   < > 176 144* 113*   < > = values in this interval not displayed.   Lab Results  Component Value Date   TSH 1.32 11/07/2022   Lab Results  Component Value Date   HGBA1C 6.4 11/07/2022   Lab Results  Component Value Date   CHOL 95 11/07/2022   HDL 59 11/07/2022   LDLCALC 27 11/07/2022   TRIG 31 (A) 11/07/2022   CHOLHDL 2 02/25/2021    Significant Diagnostic Results in last 30 days:  No results found.  Assessment/Plan Assessment and Plan Peripheral Edema Chronic leg swelling. Difficulty with compression stockings. No recent use of compression wraps. Agreed to try compression stockings with assistance. - Order compression stockings  Atrial Fibrillation Managed with Eliquis 2.5 mg. Heart rate normal. Blood pressure 113/71. - Continue Eliquis 2.5 mg  Type 2 Diabetes Mellitus Managed with metformin. Hemoglobin A1c on November 5th was 6.4. - Continue metformin  Hyperlipidemia Cholesterol levels at goal. Managed with atorvastatin 80 mg. - Continue atorvastatin 80 mg  Chronic Anemia Hemoglobin 11.2. - Continue current management  Benign Prostatic Hyperplasia (BPH) Managed with Flomax. - Continue Flomax  Diabetes Most recent A1c at goal, consider discontinue metformin for pill burden reduction.   Depression Mood well-managed on sertraline 50 mg. Discontinued mirtazapine in the last six months. Will consider dose reduction in sertraline. Continued at this dose since last visit due to hypersexualized behaviors.  - Continue sertraline 50  mg  General Health Maintenance Vitals: Weight 171.2 lbs, BP 113/71, Temp 96.22F, Pulse 69 bpm, Respiration 20, O2 saturation 97%. - Continue supportive care  Follow-up - every 60 days with MD or NP Family/ staff Communication: nursing  Labs/tests ordered:  q64mo CMP, CBC, annual TSH, q11mo A1c.

## 2023-02-18 ENCOUNTER — Encounter: Payer: Self-pay | Admitting: Student

## 2023-02-18 DIAGNOSIS — Z95811 Presence of heart assist device: Secondary | ICD-10-CM | POA: Insufficient documentation

## 2023-03-08 DIAGNOSIS — Z9181 History of falling: Secondary | ICD-10-CM | POA: Diagnosis not present

## 2023-03-08 DIAGNOSIS — R2689 Other abnormalities of gait and mobility: Secondary | ICD-10-CM | POA: Diagnosis not present

## 2023-03-13 ENCOUNTER — Ambulatory Visit (INDEPENDENT_AMBULATORY_CARE_PROVIDER_SITE_OTHER): Payer: Medicare Other

## 2023-03-13 DIAGNOSIS — I442 Atrioventricular block, complete: Secondary | ICD-10-CM

## 2023-03-14 DIAGNOSIS — M545 Low back pain, unspecified: Secondary | ICD-10-CM | POA: Diagnosis not present

## 2023-03-14 DIAGNOSIS — M25559 Pain in unspecified hip: Secondary | ICD-10-CM | POA: Diagnosis not present

## 2023-03-14 LAB — CUP PACEART REMOTE DEVICE CHECK
Battery Remaining Longevity: 108 mo
Battery Remaining Percentage: 100 %
Brady Statistic RA Percent Paced: 18 %
Brady Statistic RV Percent Paced: 92 %
Date Time Interrogation Session: 20250310000100
Implantable Lead Connection Status: 753985
Implantable Lead Connection Status: 753985
Implantable Lead Implant Date: 20230831
Implantable Lead Implant Date: 20230831
Implantable Lead Location: 753859
Implantable Lead Location: 753860
Implantable Lead Model: 7841
Implantable Lead Model: 7842
Implantable Lead Serial Number: 1220926
Implantable Lead Serial Number: 1322603
Implantable Pulse Generator Implant Date: 20230831
Lead Channel Impedance Value: 479 Ohm
Lead Channel Impedance Value: 508 Ohm
Lead Channel Pacing Threshold Amplitude: 0.6 V
Lead Channel Pacing Threshold Amplitude: 1.1 V
Lead Channel Pacing Threshold Pulse Width: 0.4 ms
Lead Channel Pacing Threshold Pulse Width: 0.4 ms
Lead Channel Setting Pacing Amplitude: 3.5 V
Lead Channel Setting Pacing Amplitude: 3.5 V
Lead Channel Setting Pacing Pulse Width: 0.4 ms
Lead Channel Setting Sensing Sensitivity: 3.5 mV
Pulse Gen Serial Number: 613820
Zone Setting Status: 755011

## 2023-03-16 ENCOUNTER — Encounter: Payer: Self-pay | Admitting: Cardiology

## 2023-03-28 ENCOUNTER — Telehealth: Payer: Self-pay | Admitting: *Deleted

## 2023-03-28 NOTE — Telephone Encounter (Signed)
 Michael Colon, emailed Medical Clearance form for Dr. Sydnee Cabal to review and sign. Stated that the office has received several calls regarding the form from Dr. Yolanda Manges with [facility] [phone]  To be signed and faxed back to Fax: [phone]  Placed on Dr. Orene Desanctis Desk at [facility] for Dr. Sydnee Cabal to Review.    Patient is a SNF Patient at [facility].

## 2023-04-04 ENCOUNTER — Non-Acute Institutional Stay (SKILLED_NURSING_FACILITY): Payer: Self-pay | Admitting: Nurse Practitioner

## 2023-04-04 ENCOUNTER — Encounter: Payer: Self-pay | Admitting: Nurse Practitioner

## 2023-04-04 DIAGNOSIS — I10 Essential (primary) hypertension: Secondary | ICD-10-CM

## 2023-04-04 DIAGNOSIS — N401 Enlarged prostate with lower urinary tract symptoms: Secondary | ICD-10-CM

## 2023-04-04 DIAGNOSIS — E782 Mixed hyperlipidemia: Secondary | ICD-10-CM | POA: Diagnosis not present

## 2023-04-04 DIAGNOSIS — I872 Venous insufficiency (chronic) (peripheral): Secondary | ICD-10-CM | POA: Diagnosis not present

## 2023-04-04 DIAGNOSIS — I482 Chronic atrial fibrillation, unspecified: Secondary | ICD-10-CM

## 2023-04-04 DIAGNOSIS — I442 Atrioventricular block, complete: Secondary | ICD-10-CM

## 2023-04-04 DIAGNOSIS — I700 Atherosclerosis of aorta: Secondary | ICD-10-CM

## 2023-04-04 DIAGNOSIS — N138 Other obstructive and reflux uropathy: Secondary | ICD-10-CM

## 2023-04-04 DIAGNOSIS — I5032 Chronic diastolic (congestive) heart failure: Secondary | ICD-10-CM | POA: Diagnosis not present

## 2023-04-04 DIAGNOSIS — E1140 Type 2 diabetes mellitus with diabetic neuropathy, unspecified: Secondary | ICD-10-CM | POA: Diagnosis not present

## 2023-04-04 DIAGNOSIS — F324 Major depressive disorder, single episode, in partial remission: Secondary | ICD-10-CM

## 2023-04-04 NOTE — Progress Notes (Unsigned)
 Location:  Other [facility]) Nursing Home Room Number: 501 A Place of Service:  SNF (31)  Ezmae Speers K. Janyth Contes, NP    Patient Care Team: [name], MD as PCP - General (Family Medicine) [name], MD as PCP - Cardiology (Cardiology) [name], MD as Consulting Physician (Cardiology)  Extended Emergency Contact Information Primary Emergency Contact: Vaiden, Adames [facility]) Address: [address]          [place], [place] [address] [name] of [employer] Phone: [phone] Relation: Son  Goals of care: Advanced Directive information    02/17/2023    3:19 PM  Advanced Directives  Does Patient Have a Medical Advance Directive? Yes  Type of Estate agent of [place];Out of facility DNR (pink MOST or yellow form);Living will  Does patient want to make changes to medical advance directive? No - Patient declined  Copy of Healthcare Power of Attorney in Chart? Yes - validated most recent copy scanned in chart (See row information)     Chief Complaint  Patient presents with   Medical Management of Chronic Issues    Routine visit. Discuss need for shingrix     HPI:  Pt is a 89 y.o. male seen today for medical management of chronic disease. ***   Past Medical History:  Diagnosis Date   Asthma    Atrial flutter (HCC) 2007   Band keratopathy    Benign prostatic hypertrophy    Chronic ear infection    Chronic kidney disease    Chronic prostatitis    Dental bridge present    permanent - upper   Diabetes mellitus    Elbow stiffness, left    s/p fracture many yrs ago.  arm does not straighten   Encounter for anticoagulation discussion and counseling 07/21/2010   Fall 12/04/2021   GERD (gastroesophageal reflux disease)    laryngeal involvement   Hyperlipidemia    Hypertension    Obstructive sleep apnea    CPAP-9   Orchitis of right testicle 11/30/2020   Osteoarthrosis, localized, primary, knee    post-traumatic    Prostatitis    Sinus drainage 03/11/2022   Stroke [facility])    TIA   Tachy-brady syndrome (HCC)    UTI (urinary tract infection)    Past Surgical History:  Procedure Laterality Date   APPENDECTOMY     BELPHAROPTOSIS REPAIR     Dr [name]---didn't resolve weepy eye and eyelid drooping   CARDIOVERSION  04/13/2010   CATARACT EXTRACTION, BILATERAL  2009   Chest pain  8/12   Stress test benign   ESOPHAGEAL DILATION  05/26/2016   Procedure: ESOPHAGEAL DILATION;  Surgeon: [name], MD;  Location: [facility];  Service: Endoscopy;;   ESOPHAGOGASTRODUODENOSCOPY (EGD) WITH PROPOFOL N/A 05/26/2016   Procedure: ESOPHAGOGASTRODUODENOSCOPY (EGD) WITH PROPOFOL;  Surgeon: [name], MD;  Location: [facility];  Service: Endoscopy;  Laterality: N/A;  Diabetic - oral meds sleep apnea   EYE SURGERY     FRACTURE SURGERY Left    elbow   KNEE SURGERY  1998   plate after fracture, then removed for infection   left elbow surgery     MASTOIDECTOMY  8/08   Dr [name]   PACEMAKER IMPLANT N/A 09/02/2021   Procedure: PACEMAKER IMPLANT;  Surgeon: [name], MD;  Location: [facility] INVASIVE CV LAB;  Service: Cardiovascular;  Laterality: N/A;   RHINOPLASTY  5/10   and septoplasty   SUBACROMIAL DECOMPRESSION Right 2005   Arthroscopic (for rotator cuff and  biceps tendon ruptures)   TEAR DUCT PROBING  11/13   Dr [name]   TOTAL KNEE ARTHROPLASTY Left 09/01/2014   Procedure: TOTAL KNEE ARTHROPLASTY;  Surgeon: [name], MD;  Location: [facility] ORS;  Service: Orthopedics;  Laterality: Left;   TRIGGER FINGER RELEASE Left 02/25/2015   Procedure: LEFT LONG TRIGGER RELEASE;  Surgeon: [name], MD;  Location: [facility] ORS;  Service: Orthopedics;  Laterality: Left;   VENOUS ABLATION      Allergies  Allergen Reactions   Lovenox [Enoxaparin] Hives   Proscar [Finasteride] Other (See Comments)    Upper body and arm weakness   Latex Rash    Has trouble with BANDAIDS that have been left on for  more than 24 hours. Prefers paper tape.   Nickel Rash    Localized rash   Percocet [Oxycodone-Acetaminophen] Rash   Tape Rash and Other (See Comments)    Sensitivity     Outpatient Encounter Medications as of 04/04/2023  Medication Sig   acetaminophen (TYLENOL) 500 MG tablet Take 1,000 mg by mouth every 8 (eight) hours as needed.   apixaban (ELIQUIS) 2.5 MG TABS tablet Take 2.5 mg by mouth 2 (two) times daily.   APPLE CIDER VINEGAR PO Take 1 capsule by mouth at bedtime.   atorvastatin (LIPITOR) 80 MG tablet Take 1 tablet (80 mg total) by mouth daily.   Calcium Polycarbophil (FIBER-CAPS PO) Take 1 capsule by mouth every morning. For stool bulking   Cranberry 250 MG TABS Take 1 tablet by mouth 2 (two) times daily.   Cyanocobalamin (B-12 PO) Take 1,000 mg by mouth daily.   finasteride (PROSCAR) 5 MG tablet Take 1 tablet (5 mg total) by mouth daily.   guaiFENesin (MUCINEX) 600 MG 12 hr tablet Take 600 mg by mouth every 8 (eight) hours as needed.   guaiFENesin (TUSSIN PO) Take 10 mLs by mouth every 4 (four) hours as needed.   lactose free nutrition (BOOST) LIQD Take 237 mLs by mouth 3 (three) times daily between meals.   lidocaine (LIDODERM) 5 % Place 1 patch onto the skin daily. Remove & Discard patch within 12 hours or as directed by MD   Magnesium Oxide 400 MG CAPS Take 1 capsule (400 mg total) by mouth daily.   metFORMIN (GLUCOPHAGE) 500 MG tablet TAKE 1 TABLET BY MOUTH  TWICE DAILY WITH MEALS   mineral oil-hydrophilic petrolatum (AQUAPHOR) ointment Apply 1 Application topically daily as needed for dry skin. To left lower leg   Polyethyl Glycol-Propyl Glycol (SYSTANE ULTRA OP) Place 1 drop into both eyes daily as needed (dry eyes).   polyethylene glycol (MIRALAX / GLYCOLAX) 17 g packet Take 17 g by mouth every 12 (twelve) hours as needed. Daily every Monday, Wednesday and Friday.   saccharomyces boulardii (FLORASTOR) 250 MG capsule Take 250 mg by mouth daily.   sertraline (ZOLOFT) 50 MG  tablet Take 50 mg by mouth daily.   sodium chloride (OCEAN) 0.65 % SOLN nasal spray Place 2 sprays into both nostrils 2 (two) times daily as needed for congestion.   Sodium Fluoride (PREVIDENT 5000 BOOSTER PLUS) 1.1 % PSTE Place 1 application  onto teeth at bedtime as needed.   tamsulosin (FLOMAX) 0.4 MG CAPS capsule Take 2 capsules (0.8 mg total) by mouth at bedtime.   Wheat Dextrin (BENEFIBER) POWD Take by mouth.  Give 1 Tbsp by mouth one time a day on odd days for bowel regulation   No facility-administered encounter medications on file as of 04/04/2023.  Review of Systems ***  Immunization History  Administered Date(s) Administered   Fluad Quad(high Dose 65+) 09/14/2018   H1N1 12/13/2007   Influenza Split 09/11/2010   Influenza Whole 09/12/2011   Influenza, High Dose Seasonal PF 09/20/2012, 09/07/2017   Influenza, Seasonal, Injecte, Preservative Fre 09/08/2007, 09/26/2008, 10/02/2014, 09/18/2015   Influenza,inj,Quad PF,6+ Mos 09/19/2013, 09/21/2015   Influenza-Unspecified 09/07/2017, 09/11/2019, 09/03/2020, 10/19/2021, 10/26/2022   Moderna Sars-Covid-2 Vaccination 01/18/2019, 02/15/2019, 11/19/2019, 10/17/2020, 06/01/2021   PNEUMOCOCCAL CONJUGATE-20 12/13/2021   Pneumococcal Conjugate-13 09/19/2013   Pneumococcal Polysaccharide-23 09/26/2003, 06/28/2016   Td 08/04/1998   Tdap 06/04/2010, 08/15/2021   Tetanus 08/04/1998   Unspecified SARS-COV-2 Vaccination 06/01/2021, 09/30/2022   Zoster Recombinant(Shingrix) 06/29/2022   Zoster, Live 01/16/2004   Pertinent  Health Maintenance Due  Topic Date Due   HEMOGLOBIN A1C  05/07/2023   OPHTHALMOLOGY EXAM  06/01/2023   INFLUENZA VACCINE  08/04/2023   FOOT EXAM  02/08/2024      12/05/2021   10:38 PM 12/06/2021    7:05 AM 12/06/2021    7:40 PM 12/07/2021    8:45 AM 06/28/2022    2:03 PM  Fall Risk  Falls in the past year?     1  Was there an injury with Fall?     1  Fall Risk Category Calculator     3  (RETIRED) Patient Fall Risk  Level Moderate fall risk High fall risk High fall risk High fall risk   Patient at Risk for Falls Due to     History of fall(s);Impaired balance/gait;Impaired mobility;Impaired vision   Functional Status Survey:    Vitals:   04/04/23 1115  BP: 123/68  Pulse: 61  SpO2: 93%  Weight: 166 lb 3.2 oz (75.4 kg)  Height: 5\' 10"  (1.778 m)   Body mass index is 23.85 kg/m. Physical Exam***  Labs reviewed: Recent Labs    05/12/22 0000 11/07/22 0000  NA 138 138  K 3.9 4.0  CL 103 104  CO2 30* 29*  BUN 26* 38*  CREATININE 0.7 0.9  CALCIUM 10.3  --    Recent Labs    05/12/22 0000 11/07/22 0000  AST 13* 13*  ALT 12 13  ALKPHOS 55 63  ALBUMIN 3.1* 3.4*   Recent Labs    05/12/22 0000 11/07/22 0000  WBC 6.9 5.2  HGB 10.6* 11.2*  HCT 32* 35*  PLT 144* 113*   Lab Results  Component Value Date   TSH 1.32 11/07/2022   Lab Results  Component Value Date   HGBA1C 6.4 11/07/2022   Lab Results  Component Value Date   CHOL 95 11/07/2022   HDL 59 11/07/2022   LDLCALC 27 11/07/2022   TRIG 31 (A) 11/07/2022   CHOLHDL 2 02/25/2021    Significant Diagnostic Results in last 30 days:  CUP PACEART REMOTE DEVICE CHECK Result Date: 03/14/2023 Scheduled remote reviewed. Normal device function.  1 AHR detection lasting 5 seconds. Next remote 91 days. - CS, CVRS   Assessment/Plan No problem-specific Assessment & Plan notes found for this encounter.     Janene Harvey. Biagio Borg [facility] [phone]

## 2023-04-05 DIAGNOSIS — I495 Sick sinus syndrome: Secondary | ICD-10-CM | POA: Diagnosis not present

## 2023-04-05 DIAGNOSIS — M19122 Post-traumatic osteoarthritis, left elbow: Secondary | ICD-10-CM | POA: Diagnosis not present

## 2023-04-05 DIAGNOSIS — N390 Urinary tract infection, site not specified: Secondary | ICD-10-CM | POA: Diagnosis not present

## 2023-04-05 DIAGNOSIS — I110 Hypertensive heart disease with heart failure: Secondary | ICD-10-CM | POA: Diagnosis not present

## 2023-04-05 DIAGNOSIS — M7022 Olecranon bursitis, left elbow: Secondary | ICD-10-CM | POA: Diagnosis not present

## 2023-04-05 DIAGNOSIS — S42402D Unspecified fracture of lower end of left humerus, subsequent encounter for fracture with routine healing: Secondary | ICD-10-CM | POA: Diagnosis not present

## 2023-04-05 DIAGNOSIS — M6281 Muscle weakness (generalized): Secondary | ICD-10-CM | POA: Diagnosis not present

## 2023-04-05 DIAGNOSIS — I442 Atrioventricular block, complete: Secondary | ICD-10-CM | POA: Diagnosis not present

## 2023-04-05 DIAGNOSIS — Z9181 History of falling: Secondary | ICD-10-CM | POA: Diagnosis not present

## 2023-04-05 DIAGNOSIS — I5032 Chronic diastolic (congestive) heart failure: Secondary | ICD-10-CM | POA: Diagnosis not present

## 2023-04-05 DIAGNOSIS — Z950 Presence of cardiac pacemaker: Secondary | ICD-10-CM | POA: Diagnosis not present

## 2023-04-05 DIAGNOSIS — I482 Chronic atrial fibrillation, unspecified: Secondary | ICD-10-CM | POA: Diagnosis not present

## 2023-04-05 DIAGNOSIS — E1140 Type 2 diabetes mellitus with diabetic neuropathy, unspecified: Secondary | ICD-10-CM | POA: Diagnosis not present

## 2023-04-05 DIAGNOSIS — R2689 Other abnormalities of gait and mobility: Secondary | ICD-10-CM | POA: Diagnosis not present

## 2023-04-05 DIAGNOSIS — R278 Other lack of coordination: Secondary | ICD-10-CM | POA: Diagnosis not present

## 2023-04-05 DIAGNOSIS — D696 Thrombocytopenia, unspecified: Secondary | ICD-10-CM | POA: Diagnosis not present

## 2023-04-05 DIAGNOSIS — E119 Type 2 diabetes mellitus without complications: Secondary | ICD-10-CM | POA: Diagnosis not present

## 2023-04-05 DIAGNOSIS — R2681 Unsteadiness on feet: Secondary | ICD-10-CM | POA: Diagnosis not present

## 2023-04-05 DIAGNOSIS — R262 Difficulty in walking, not elsewhere classified: Secondary | ICD-10-CM | POA: Diagnosis not present

## 2023-04-05 DIAGNOSIS — Z741 Need for assistance with personal care: Secondary | ICD-10-CM | POA: Diagnosis not present

## 2023-04-07 DIAGNOSIS — S42402D Unspecified fracture of lower end of left humerus, subsequent encounter for fracture with routine healing: Secondary | ICD-10-CM | POA: Diagnosis not present

## 2023-04-07 DIAGNOSIS — D696 Thrombocytopenia, unspecified: Secondary | ICD-10-CM | POA: Diagnosis not present

## 2023-04-07 DIAGNOSIS — M6281 Muscle weakness (generalized): Secondary | ICD-10-CM | POA: Diagnosis not present

## 2023-04-07 DIAGNOSIS — R278 Other lack of coordination: Secondary | ICD-10-CM | POA: Diagnosis not present

## 2023-04-07 DIAGNOSIS — Z9181 History of falling: Secondary | ICD-10-CM | POA: Diagnosis not present

## 2023-04-07 DIAGNOSIS — M7022 Olecranon bursitis, left elbow: Secondary | ICD-10-CM | POA: Diagnosis not present

## 2023-04-07 DIAGNOSIS — N390 Urinary tract infection, site not specified: Secondary | ICD-10-CM | POA: Diagnosis not present

## 2023-04-07 DIAGNOSIS — M19122 Post-traumatic osteoarthritis, left elbow: Secondary | ICD-10-CM | POA: Diagnosis not present

## 2023-04-07 DIAGNOSIS — I495 Sick sinus syndrome: Secondary | ICD-10-CM | POA: Diagnosis not present

## 2023-04-07 DIAGNOSIS — I5032 Chronic diastolic (congestive) heart failure: Secondary | ICD-10-CM | POA: Diagnosis not present

## 2023-04-07 DIAGNOSIS — R2681 Unsteadiness on feet: Secondary | ICD-10-CM | POA: Diagnosis not present

## 2023-04-07 DIAGNOSIS — Z741 Need for assistance with personal care: Secondary | ICD-10-CM | POA: Diagnosis not present

## 2023-04-07 DIAGNOSIS — R262 Difficulty in walking, not elsewhere classified: Secondary | ICD-10-CM | POA: Diagnosis not present

## 2023-04-07 DIAGNOSIS — I110 Hypertensive heart disease with heart failure: Secondary | ICD-10-CM | POA: Diagnosis not present

## 2023-04-07 DIAGNOSIS — R2689 Other abnormalities of gait and mobility: Secondary | ICD-10-CM | POA: Diagnosis not present

## 2023-04-07 DIAGNOSIS — I482 Chronic atrial fibrillation, unspecified: Secondary | ICD-10-CM | POA: Diagnosis not present

## 2023-04-07 DIAGNOSIS — Z950 Presence of cardiac pacemaker: Secondary | ICD-10-CM | POA: Diagnosis not present

## 2023-04-07 DIAGNOSIS — E1140 Type 2 diabetes mellitus with diabetic neuropathy, unspecified: Secondary | ICD-10-CM | POA: Diagnosis not present

## 2023-04-07 DIAGNOSIS — I442 Atrioventricular block, complete: Secondary | ICD-10-CM | POA: Diagnosis not present

## 2023-04-07 DIAGNOSIS — E119 Type 2 diabetes mellitus without complications: Secondary | ICD-10-CM | POA: Diagnosis not present

## 2023-04-07 NOTE — Assessment & Plan Note (Signed)
 LDL at goal on lipitor 80 mg daily

## 2023-04-07 NOTE — Assessment & Plan Note (Signed)
 Continues on lipitor and eliquis

## 2023-04-07 NOTE — Assessment & Plan Note (Signed)
 S/p pacer, followed by cardiology

## 2023-04-07 NOTE — Assessment & Plan Note (Signed)
 A1c controlled on last lab, continues on metformin. Will follow a1c

## 2023-04-07 NOTE — Assessment & Plan Note (Signed)
 Rate controlled, continues on eliquis for anticoagulation

## 2023-04-07 NOTE — Assessment & Plan Note (Signed)
 Stable on flomax and proscar.

## 2023-04-07 NOTE — Assessment & Plan Note (Signed)
 Euvolemic.

## 2023-04-07 NOTE — Assessment & Plan Note (Signed)
 Blood pressure well controlled, goal bp <140/90 Continue current medications and dietary modifications follow metabolic panel

## 2023-04-07 NOTE — Assessment & Plan Note (Signed)
-  controlled at this time. encouraged to elevate legs above level of heart as tolerates, low sodium diet, compression hose as tolerates (on in am, off in pm)

## 2023-04-10 DIAGNOSIS — N390 Urinary tract infection, site not specified: Secondary | ICD-10-CM | POA: Diagnosis not present

## 2023-04-10 DIAGNOSIS — E119 Type 2 diabetes mellitus without complications: Secondary | ICD-10-CM | POA: Diagnosis not present

## 2023-04-10 DIAGNOSIS — M19122 Post-traumatic osteoarthritis, left elbow: Secondary | ICD-10-CM | POA: Diagnosis not present

## 2023-04-10 DIAGNOSIS — Z950 Presence of cardiac pacemaker: Secondary | ICD-10-CM | POA: Diagnosis not present

## 2023-04-10 DIAGNOSIS — R278 Other lack of coordination: Secondary | ICD-10-CM | POA: Diagnosis not present

## 2023-04-10 DIAGNOSIS — S42402D Unspecified fracture of lower end of left humerus, subsequent encounter for fracture with routine healing: Secondary | ICD-10-CM | POA: Diagnosis not present

## 2023-04-10 DIAGNOSIS — I442 Atrioventricular block, complete: Secondary | ICD-10-CM | POA: Diagnosis not present

## 2023-04-10 DIAGNOSIS — R2681 Unsteadiness on feet: Secondary | ICD-10-CM | POA: Diagnosis not present

## 2023-04-10 DIAGNOSIS — I110 Hypertensive heart disease with heart failure: Secondary | ICD-10-CM | POA: Diagnosis not present

## 2023-04-10 DIAGNOSIS — Z9181 History of falling: Secondary | ICD-10-CM | POA: Diagnosis not present

## 2023-04-10 DIAGNOSIS — B351 Tinea unguium: Secondary | ICD-10-CM | POA: Diagnosis not present

## 2023-04-10 DIAGNOSIS — I495 Sick sinus syndrome: Secondary | ICD-10-CM | POA: Diagnosis not present

## 2023-04-10 DIAGNOSIS — E1140 Type 2 diabetes mellitus with diabetic neuropathy, unspecified: Secondary | ICD-10-CM | POA: Diagnosis not present

## 2023-04-10 DIAGNOSIS — R262 Difficulty in walking, not elsewhere classified: Secondary | ICD-10-CM | POA: Diagnosis not present

## 2023-04-10 DIAGNOSIS — I482 Chronic atrial fibrillation, unspecified: Secondary | ICD-10-CM | POA: Diagnosis not present

## 2023-04-10 DIAGNOSIS — R2689 Other abnormalities of gait and mobility: Secondary | ICD-10-CM | POA: Diagnosis not present

## 2023-04-10 DIAGNOSIS — E1159 Type 2 diabetes mellitus with other circulatory complications: Secondary | ICD-10-CM | POA: Diagnosis not present

## 2023-04-10 DIAGNOSIS — D696 Thrombocytopenia, unspecified: Secondary | ICD-10-CM | POA: Diagnosis not present

## 2023-04-10 DIAGNOSIS — Z741 Need for assistance with personal care: Secondary | ICD-10-CM | POA: Diagnosis not present

## 2023-04-10 DIAGNOSIS — I5032 Chronic diastolic (congestive) heart failure: Secondary | ICD-10-CM | POA: Diagnosis not present

## 2023-04-10 DIAGNOSIS — M6281 Muscle weakness (generalized): Secondary | ICD-10-CM | POA: Diagnosis not present

## 2023-04-10 DIAGNOSIS — M7022 Olecranon bursitis, left elbow: Secondary | ICD-10-CM | POA: Diagnosis not present

## 2023-04-25 DIAGNOSIS — M19122 Post-traumatic osteoarthritis, left elbow: Secondary | ICD-10-CM | POA: Diagnosis not present

## 2023-04-25 DIAGNOSIS — M7022 Olecranon bursitis, left elbow: Secondary | ICD-10-CM | POA: Diagnosis not present

## 2023-04-25 DIAGNOSIS — Z950 Presence of cardiac pacemaker: Secondary | ICD-10-CM | POA: Diagnosis not present

## 2023-04-25 DIAGNOSIS — I110 Hypertensive heart disease with heart failure: Secondary | ICD-10-CM | POA: Diagnosis not present

## 2023-04-25 DIAGNOSIS — I442 Atrioventricular block, complete: Secondary | ICD-10-CM | POA: Diagnosis not present

## 2023-04-25 DIAGNOSIS — Z741 Need for assistance with personal care: Secondary | ICD-10-CM | POA: Diagnosis not present

## 2023-04-25 DIAGNOSIS — E1140 Type 2 diabetes mellitus with diabetic neuropathy, unspecified: Secondary | ICD-10-CM | POA: Diagnosis not present

## 2023-04-25 DIAGNOSIS — R278 Other lack of coordination: Secondary | ICD-10-CM | POA: Diagnosis not present

## 2023-04-25 DIAGNOSIS — E119 Type 2 diabetes mellitus without complications: Secondary | ICD-10-CM | POA: Diagnosis not present

## 2023-04-25 DIAGNOSIS — D696 Thrombocytopenia, unspecified: Secondary | ICD-10-CM | POA: Diagnosis not present

## 2023-04-25 DIAGNOSIS — R2681 Unsteadiness on feet: Secondary | ICD-10-CM | POA: Diagnosis not present

## 2023-04-25 DIAGNOSIS — I482 Chronic atrial fibrillation, unspecified: Secondary | ICD-10-CM | POA: Diagnosis not present

## 2023-04-25 DIAGNOSIS — I5032 Chronic diastolic (congestive) heart failure: Secondary | ICD-10-CM | POA: Diagnosis not present

## 2023-04-25 DIAGNOSIS — R2689 Other abnormalities of gait and mobility: Secondary | ICD-10-CM | POA: Diagnosis not present

## 2023-04-25 DIAGNOSIS — N390 Urinary tract infection, site not specified: Secondary | ICD-10-CM | POA: Diagnosis not present

## 2023-04-25 DIAGNOSIS — M6281 Muscle weakness (generalized): Secondary | ICD-10-CM | POA: Diagnosis not present

## 2023-04-25 DIAGNOSIS — Z9181 History of falling: Secondary | ICD-10-CM | POA: Diagnosis not present

## 2023-04-25 DIAGNOSIS — S42402D Unspecified fracture of lower end of left humerus, subsequent encounter for fracture with routine healing: Secondary | ICD-10-CM | POA: Diagnosis not present

## 2023-04-25 DIAGNOSIS — R262 Difficulty in walking, not elsewhere classified: Secondary | ICD-10-CM | POA: Diagnosis not present

## 2023-04-25 DIAGNOSIS — I495 Sick sinus syndrome: Secondary | ICD-10-CM | POA: Diagnosis not present

## 2023-05-01 NOTE — Progress Notes (Signed)
 Remote pacemaker transmission.

## 2023-05-01 NOTE — Addendum Note (Signed)
 Addended by: Edra Govern D on: 05/01/2023 10:23 AM   Modules accepted: Orders

## 2023-05-05 ENCOUNTER — Encounter: Payer: Self-pay | Admitting: Pharmacist

## 2023-05-05 NOTE — Progress Notes (Unsigned)
   05/05/2023  Patient ID: Michael Colon, male   DOB: 1930/03/31, 89 y.o.   MRN: [id number]  Pharmacy Quality Measure Review  This patient is appearing on a report for being at risk of failing the adherence measure for cholesterol (statin) and diabetes medications this calendar year.   Medication: Metformin  500 mg Atorvastatin  80 mg Last fill date: Patient gets medications filled at [facility] .  It is unclear how [facility] bills their medications   {adherenceinterventions:31257}

## 2023-05-08 DIAGNOSIS — I110 Hypertensive heart disease with heart failure: Secondary | ICD-10-CM | POA: Diagnosis not present

## 2023-05-08 DIAGNOSIS — E119 Type 2 diabetes mellitus without complications: Secondary | ICD-10-CM | POA: Diagnosis not present

## 2023-05-08 DIAGNOSIS — E785 Hyperlipidemia, unspecified: Secondary | ICD-10-CM | POA: Diagnosis not present

## 2023-05-08 LAB — TSH: TSH: 1.16 (ref 0.41–5.90)

## 2023-05-08 LAB — COMPREHENSIVE METABOLIC PANEL WITH GFR
Albumin: 3.2 — AB (ref 3.5–5.0)
Calcium: 9.9 (ref 8.7–10.7)
Globulin: 2.2

## 2023-05-08 LAB — BASIC METABOLIC PANEL WITH GFR
BUN: 26 — AB (ref 4–21)
CO2: 30 — AB (ref 13–22)
Chloride: 105 (ref 99–108)
Creatinine: 0.8 (ref 0.6–1.3)
Glucose: 83
Potassium: 4.2 meq/L (ref 3.5–5.1)
Sodium: 138 (ref 137–147)

## 2023-05-08 LAB — CBC AND DIFFERENTIAL
HCT: 24 — AB (ref 41–53)
Hemoglobin: 6.9 — AB (ref 13.5–17.5)
Neutrophils Absolute: 1915
Platelets: 98 10*3/uL — AB (ref 150–400)
WBC: 4.1

## 2023-05-08 LAB — HEMOGLOBIN A1C: Hemoglobin A1C: 6.9

## 2023-05-08 LAB — CBC: RBC: 2.63 — AB (ref 3.87–5.11)

## 2023-05-08 LAB — HEPATIC FUNCTION PANEL
ALT: 9 U/L — AB (ref 10–40)
AST: 12 — AB (ref 14–40)
Alkaline Phosphatase: 64 (ref 25–125)
Bilirubin, Total: 0.4

## 2023-05-15 ENCOUNTER — Non-Acute Institutional Stay (SKILLED_NURSING_FACILITY): Admitting: Student

## 2023-05-15 ENCOUNTER — Encounter: Payer: Self-pay | Admitting: Student

## 2023-05-15 DIAGNOSIS — D649 Anemia, unspecified: Secondary | ICD-10-CM

## 2023-05-15 DIAGNOSIS — I5032 Chronic diastolic (congestive) heart failure: Secondary | ICD-10-CM | POA: Diagnosis not present

## 2023-05-15 DIAGNOSIS — I482 Chronic atrial fibrillation, unspecified: Secondary | ICD-10-CM | POA: Diagnosis not present

## 2023-05-15 DIAGNOSIS — I10 Essential (primary) hypertension: Secondary | ICD-10-CM | POA: Diagnosis not present

## 2023-05-15 LAB — CBC AND DIFFERENTIAL
HCT: 25 — AB (ref 41–53)
Hemoglobin: 7.5 — AB (ref 13.5–17.5)
Platelets: 139 10*3/uL — AB (ref 150–400)
WBC: 5.9

## 2023-05-15 LAB — CBC: RBC: 2.91 — AB (ref 3.87–5.11)

## 2023-05-15 NOTE — Progress Notes (Signed)
 Location:  Other Nursing Home Room Number: [facility] [place] - 501A Place of Service:  SNF (31) Provider:  Alver Jobs, Lisa Rideau, MD  Patient Care Team: Valrie Gehrig, MD as PCP - General (Family Medicine) Devorah Fonder, MD as PCP - Cardiology (Cardiology) Devorah Fonder, MD as Consulting Physician (Cardiology)  Extended Emergency Contact Information Primary Emergency Contact: Jorge Newcomer [facility]) Address: [address]          [place], [place] [address] [place] Phone: [phone] Relation: Son  Code Status:  DNR Goals of care: Advanced Directive information    02/17/2023    3:19 PM  Advanced Directives  Does Patient Have a Medical Advance Directive? Yes  Type of Estate agent of [place];Out of facility DNR (pink MOST or yellow form);Living will  Does patient want to make changes to medical advance directive? No - Patient declined  Copy of Healthcare Power of Attorney in Chart? Yes - validated most recent copy scanned in chart (See row information)     Chief Complaint  Patient presents with   Abnormal labs    HPI:  Pt is a 89 y.o. male seen today for an acute visit for anemia.  History of Present Illness The patient, with atrial fibrillation, presents with low hemoglobin levels identified during routine lab work.  His hemoglobin level was found to be 6.9 g/dL during routine lab work conducted last week, which is lower than his previous value approximately six months ago. He is unaware of any significant bleeding events that could account for this drop, mentioning only a minor cut on his hand. He recalls a previous incident where his wife was informed in the emergency department that he was low by two units of blood.  No symptoms typically associated with low hemoglobin levels, such as shortness of breath, weakness, or lightheadedness. He mentions being active, stating 'I go ninety miles an hour' and does not notice  any issues while walking with therapy.  He is currently on blood thinners, which could increase his risk of bleeding. He does not recall having a recent consultation with a hematologist, although he mentions past visits to an orthopedic specialist for fluid withdrawal from his knee. He is uncertain about any recent changes in his diet or iron supplementation.  Past Medical History:  Diagnosis Date   Asthma    Atrial flutter (HCC) 2007   Band keratopathy    Benign prostatic hypertrophy    Chronic ear infection    Chronic kidney disease    Chronic prostatitis    Dental bridge present    permanent - upper   Diabetes mellitus    Elbow stiffness, left    s/p fracture many yrs ago.  arm does not straighten   Encounter for anticoagulation discussion and counseling 07/21/2010   Fall 12/04/2021   GERD (gastroesophageal reflux disease)    laryngeal involvement   Hyperlipidemia    Hypertension    Obstructive sleep apnea    CPAP-9   Orchitis of right testicle 11/30/2020   Osteoarthrosis, localized, primary, knee    post-traumatic   Prostatitis    Sinus drainage 03/11/2022   Stroke [facility])    TIA   Tachy-brady syndrome (HCC)    UTI (urinary tract infection)    Past Surgical History:  Procedure Laterality Date   APPENDECTOMY     BELPHAROPTOSIS REPAIR     Dr [name]---didn't resolve weepy eye and eyelid drooping   CARDIOVERSION  04/13/2010   CATARACT EXTRACTION, BILATERAL  2009   Chest pain  8/12   Stress test benign   ESOPHAGEAL DILATION  05/26/2016   Procedure: ESOPHAGEAL DILATION;  Surgeon: Marnee Sink, MD;  Location: [facility];  Service: Endoscopy;;   ESOPHAGOGASTRODUODENOSCOPY (EGD) WITH PROPOFOL  N/A 05/26/2016   Procedure: ESOPHAGOGASTRODUODENOSCOPY (EGD) WITH PROPOFOL ;  Surgeon: Marnee Sink, MD;  Location: [facility];  Service: Endoscopy;  Laterality: N/A;  Diabetic - oral meds sleep apnea   EYE SURGERY     FRACTURE SURGERY Left    elbow   KNEE SURGERY   1998   plate after fracture, then removed for infection   left elbow surgery     MASTOIDECTOMY  8/08   Dr Gwynda Leriche   PACEMAKER IMPLANT N/A 09/02/2021   Procedure: PACEMAKER IMPLANT;  Surgeon: Boyce Byes, MD;  Location: [facility] INVASIVE CV LAB;  Service: Cardiovascular;  Laterality: N/A;   RHINOPLASTY  5/10   and septoplasty   SUBACROMIAL DECOMPRESSION Right 2005   Arthroscopic (for rotator cuff and biceps tendon ruptures)   TEAR DUCT PROBING  11/13   Dr Althea Atkinson   TOTAL KNEE ARTHROPLASTY Left 09/01/2014   Procedure: TOTAL KNEE ARTHROPLASTY;  Surgeon: Arlyne Lame, MD;  Location: [facility] ORS;  Service: Orthopedics;  Laterality: Left;   TRIGGER FINGER RELEASE Left 02/25/2015   Procedure: LEFT LONG TRIGGER RELEASE;  Surgeon: Arlyne Lame, MD;  Location: [facility] ORS;  Service: Orthopedics;  Laterality: Left;   VENOUS ABLATION      Allergies  Allergen Reactions   Lovenox [Enoxaparin] Hives   Proscar  [Finasteride ] Other (See Comments)    Upper body and arm weakness   Latex Rash    Has trouble with BANDAIDS that have been left on for more than 24 hours. Prefers paper tape.   Nickel Rash    Localized rash   Percocet [Oxycodone-Acetaminophen ] Rash   Tape Rash and Other (See Comments)    Sensitivity     Outpatient Encounter Medications as of 05/15/2023  Medication Sig   chlorhexidine  (PERIDEX ) 0.12 % solution    clindamycin (CLEOCIN T) 1 % external solution Apply topically 2 (two) times daily.   silver  sulfADIAZINE  (SILVADENE ) 1 % cream Apply topically.   traMADol  (ULTRAM ) 50 MG tablet Take by mouth.   acetaminophen  (TYLENOL ) 500 MG tablet Take 1,000 mg by mouth every 8 (eight) hours as needed.   apixaban  (ELIQUIS ) 2.5 MG TABS tablet Take 2.5 mg by mouth 2 (two) times daily.   APPLE CIDER VINEGAR PO Take 1 capsule by mouth at bedtime.   atorvastatin  (LIPITOR ) 80 MG tablet Take 1 tablet (80 mg total) by mouth daily.   Calcium  Polycarbophil (FIBER-CAPS PO) Take 1 capsule by mouth every  morning. For stool bulking   Cranberry 250 MG TABS Take 1 tablet by mouth 2 (two) times daily.   Cyanocobalamin  (B-12 PO) Take 1,000 mg by mouth daily.   finasteride  (PROSCAR ) 5 MG tablet Take 1 tablet (5 mg total) by mouth daily.   guaiFENesin  (MUCINEX ) 600 MG 12 hr tablet Take 600 mg by mouth every 8 (eight) hours as needed.   guaiFENesin  (TUSSIN PO) Take 10 mLs by mouth every 4 (four) hours as needed.   lactose free nutrition (BOOST) LIQD Take 237 mLs by mouth 3 (three) times daily between meals.   lidocaine  (LIDODERM ) 5 % Place 1 patch onto the skin daily. Remove & Discard patch within 12 hours or as directed by MD   Magnesium  Oxide 400 MG CAPS Take 1 capsule (400 mg total) by  mouth daily.   metFORMIN  (GLUCOPHAGE ) 500 MG tablet TAKE 1 TABLET BY MOUTH  TWICE DAILY WITH MEALS   mineral oil-hydrophilic petrolatum (AQUAPHOR) ointment Apply 1 Application topically daily as needed for dry skin. To left lower leg   Polyethyl Glycol-Propyl Glycol (SYSTANE ULTRA OP) Place 1 drop into both eyes daily as needed (dry eyes).   polyethylene glycol (MIRALAX  / GLYCOLAX ) 17 g packet Take 17 g by mouth every 12 (twelve) hours as needed. Daily every Monday, Wednesday and Friday.   saccharomyces boulardii (FLORASTOR) 250 MG capsule Take 250 mg by mouth daily.   sertraline (ZOLOFT) 50 MG tablet Take 50 mg by mouth daily.   sodium chloride  (OCEAN) 0.65 % SOLN nasal spray Place 2 sprays into both nostrils 2 (two) times daily as needed for congestion.   Sodium Fluoride (PREVIDENT 5000 BOOSTER PLUS) 1.1 % PSTE Place 1 application  onto teeth at bedtime as needed.   tamsulosin  (FLOMAX ) 0.4 MG CAPS capsule Take 2 capsules (0.8 mg total) by mouth at bedtime.   Wheat Dextrin (BENEFIBER) POWD Take by mouth.  Give 1 Tbsp by mouth one time a day on odd days for bowel regulation   No facility-administered encounter medications on file as of 05/15/2023.    Review of Systems  Immunization History  Administered Date(s)  Administered   Fluad Quad(high Dose 65+) 09/14/2018   H1N1 12/13/2007   Influenza Split 09/11/2010   Influenza Whole 09/12/2011   Influenza, High Dose Seasonal PF 09/20/2012, 09/07/2017   Influenza, Seasonal, Injecte, Preservative Fre 09/08/2007, 09/26/2008, 10/02/2014, 09/18/2015   Influenza,inj,Quad PF,6+ Mos 09/19/2013, 09/21/2015   Influenza-Unspecified 09/07/2017, 09/11/2019, 09/03/2020, 10/19/2021, 10/26/2022   Moderna Sars-Covid-2 Vaccination 01/18/2019, 02/15/2019, 11/19/2019, 10/17/2020, 06/01/2021   PNEUMOCOCCAL CONJUGATE-20 12/13/2021   Pneumococcal Conjugate-13 09/19/2013   Pneumococcal Polysaccharide-23 09/26/2003, 06/28/2016   Td 08/04/1998   Tdap 06/04/2010, 08/15/2021   Tetanus 08/04/1998   Unspecified SARS-COV-2 Vaccination 06/01/2021, 09/30/2022   Zoster Recombinant(Shingrix) 06/29/2022   Zoster, Live 01/16/2004   Pertinent  Health Maintenance Due  Topic Date Due   HEMOGLOBIN A1C  05/07/2023   OPHTHALMOLOGY EXAM  06/01/2023   INFLUENZA VACCINE  08/04/2023   FOOT EXAM  02/08/2024      12/05/2021   10:38 PM 12/06/2021    7:05 AM 12/06/2021    7:40 PM 12/07/2021    8:45 AM 06/28/2022    2:03 PM  Fall Risk  Falls in the past year?     1  Was there an injury with Fall?     1  Fall Risk Category Calculator     3  (RETIRED) Patient Fall Risk Level Moderate fall risk High fall risk High fall risk High fall risk   Patient at Risk for Falls Due to     History of fall(s);Impaired balance/gait;Impaired mobility;Impaired vision   Functional Status Survey:    There were no vitals filed for this visit. There is no height or weight on file to calculate BMI. Physical Exam Constitutional:      Comments: Thin, frail  Cardiovascular:     Rate and Rhythm: Normal rate.     Pulses: Normal pulses.  Pulmonary:     Effort: Pulmonary effort is normal.     Breath sounds: Normal breath sounds.  Abdominal:     General: Abdomen is flat.     Palpations: Abdomen is soft.   Musculoskeletal:        General: Normal range of motion.  Skin:    General: Skin is warm.  Neurological:  Mental Status: Mental status is at baseline.     Labs reviewed: Recent Labs    11/07/22 0000  NA 138  K 4.0  CL 104  CO2 29*  BUN 38*  CREATININE 0.9   Recent Labs    11/07/22 0000  AST 13*  ALT 13  ALKPHOS 63  ALBUMIN 3.4*   Recent Labs    11/07/22 0000  WBC 5.2  HGB 11.2*  HCT 35*  PLT 113*   Lab Results  Component Value Date   TSH 1.32 11/07/2022   Lab Results  Component Value Date   HGBA1C 6.4 11/07/2022   Lab Results  Component Value Date   CHOL 95 11/07/2022   HDL 59 11/07/2022   LDLCALC 27 11/07/2022   TRIG 31 (A) 11/07/2022   CHOLHDL 2 02/25/2021    Significant Diagnostic Results in last 30 days:  No results found.  Assessment/Plan Anemia Acute on chronic Severe anemia with hemoglobin at 6.9 g/dL, near transfusion threshold, with repeat improving to 7.5 g/dL.  Etiology unclear, with potential causes including decreased production or increased loss. Anticoagulant use may elevate bleeding risk. Absence of symptoms like dyspnea or asthenia suggests possible lab error. He is amenable to transfusion if necessary but prefers outpatient management. - Order repeat hemoglobin test to confirm levels. - If hemoglobin remains below 7 g/dL, recommend hospital visit for transfusion. - Discuss option to refuse hospital admission. - Stool hemoccult x3 days - Repeat CBC, iron level, BMP, Vit B12 on Thursday.  - Patient states he would be open to transfusion, but does not want to be admitted to the hospital if it is indicated.   Atrial Fibrillation Chronic atrial fibrillation with normal heart rate and no new symptoms.  Family/ staff Communication: nursing  Labs/tests ordered:  above

## 2023-05-16 ENCOUNTER — Telehealth: Payer: Self-pay | Admitting: Pharmacist

## 2023-05-16 DIAGNOSIS — Z79899 Other long term (current) drug therapy: Secondary | ICD-10-CM

## 2023-05-16 NOTE — Progress Notes (Unsigned)
 Michael Colon

## 2023-05-17 NOTE — Progress Notes (Signed)
   05/17/2023  Patient ID: Michael Colon, male   DOB: [birth year], 89 y.o.   MRN: [id number]  Pharmacy Quality Measure Review  This patient is appearing on a report for being at risk of failing the adherence measure for cholesterol (statin) and diabetes medications this calendar year.   Medication: atorvastatin  80 mg and Metformin  500 mg  Last fill date: 05/10/23 for a 30 day supply for both medications  Contacted pharmacy to facilitate refills.--Spoke with someone at [facility] who confirmed both medications are being filled weekly in "cycle packs". They also confirmed that the Patient's Medicare Part D was used but that they "drop the refill once monthly" which may or may not line up with the refill date on the report.   Geronimo Krabbe, PharmD, BCACP Clinical Pharmacist [phone]

## 2023-05-18 DIAGNOSIS — I110 Hypertensive heart disease with heart failure: Secondary | ICD-10-CM | POA: Diagnosis not present

## 2023-05-18 DIAGNOSIS — D649 Anemia, unspecified: Secondary | ICD-10-CM | POA: Diagnosis not present

## 2023-05-18 DIAGNOSIS — I5032 Chronic diastolic (congestive) heart failure: Secondary | ICD-10-CM | POA: Diagnosis not present

## 2023-05-18 LAB — IRON,TIBC AND FERRITIN PANEL
%SAT: 4
Ferritin: 8
Iron: 13
TIBC: 326

## 2023-05-18 LAB — BASIC METABOLIC PANEL WITH GFR
BUN: 30 — AB (ref 4–21)
CO2: 27 — AB (ref 13–22)
Chloride: 105 (ref 99–108)
Creatinine: 0.7 (ref 0.6–1.3)
Glucose: 85
Potassium: 4.1 meq/L (ref 3.5–5.1)
Sodium: 139 (ref 137–147)

## 2023-05-18 LAB — CBC AND DIFFERENTIAL
HCT: 23 — AB (ref 41–53)
Hemoglobin: 6.9 — AB (ref 13.5–17.5)
Platelets: 147 10*3/uL — AB (ref 150–400)
WBC: 4.3

## 2023-05-18 LAB — VITAMIN B12: Vitamin B-12: 768

## 2023-05-18 LAB — CBC: RBC: 2.71 — AB (ref 3.87–5.11)

## 2023-05-18 LAB — COMPREHENSIVE METABOLIC PANEL WITH GFR
Calcium: 10 (ref 8.7–10.7)
eGFR: 87

## 2023-05-19 ENCOUNTER — Encounter: Payer: Self-pay | Admitting: Student

## 2023-05-19 ENCOUNTER — Non-Acute Institutional Stay (SKILLED_NURSING_FACILITY): Payer: Self-pay | Admitting: Student

## 2023-05-19 DIAGNOSIS — I5032 Chronic diastolic (congestive) heart failure: Secondary | ICD-10-CM | POA: Diagnosis not present

## 2023-05-19 DIAGNOSIS — D509 Iron deficiency anemia, unspecified: Secondary | ICD-10-CM

## 2023-05-19 DIAGNOSIS — I700 Atherosclerosis of aorta: Secondary | ICD-10-CM | POA: Diagnosis not present

## 2023-05-19 DIAGNOSIS — I482 Chronic atrial fibrillation, unspecified: Secondary | ICD-10-CM | POA: Diagnosis not present

## 2023-05-19 DIAGNOSIS — Z95811 Presence of heart assist device: Secondary | ICD-10-CM

## 2023-05-19 NOTE — Progress Notes (Signed)
 Location:  Other [place].  Nursing Home Room Number: [facility] [address] Place of Service:  SNF [phone]) Provider:  Valrie Gehrig, MD  Patient Care Team: Valrie Gehrig, MD as PCP - General (Family Medicine) Devorah Fonder, MD as PCP - Cardiology (Cardiology) Devorah Fonder, MD as Consulting Physician (Cardiology)  Extended Emergency Contact Information Primary Emergency Contact: Jorge Newcomer [facility]) Address: [address]          [place], [place] [address] [place] Phone: [phone] Relation: Son  Code Status:  DNR Goals of care: Advanced Directive information    05/19/2023    2:19 PM  Advanced Directives  Does Patient Have a Medical Advance Directive? Yes  Type of Estate agent of [place];Out of facility DNR (pink MOST or yellow form);Living will  Does patient want to make changes to medical advance directive? No - Patient declined  Copy of Healthcare Power of Attorney in Chart? Yes - validated most recent copy scanned in chart (See row information)     Chief Complaint  Patient presents with   Anemia    Anemia    HPI:  Pt is a 89 y.o. male seen today for an acute visit for Anemia History of Present Illness The patient is a 89 year old who presents with low hemoglobin levels and anemia management.  He has been experiencing a drop in hemoglobin levels, currently at 6.9. Despite this, he feels 'pretty good' and has an upcoming appointment with a hematologist on Tuesday.  He recalls significant bleeding following recent tooth extractions, which may have contributed to his current low hemoglobin levels. He expected his hemoglobin to increase from 7.5 earlier in the week, but it has not. No signs of bleeding in his stool have been noticed.  He has not experienced any worsening symptoms despite the low hemoglobin levels. He has not spoken with his son this week but is open to having him contacted regarding his health  situation.  Family History - Wife had 2 units of blood transfusion due to illness  Results LABS Hb: 6.9 (05/18/2023) Hb: 7.5 (05/15/2023)   Past Medical History:  Diagnosis Date   Asthma    Atrial flutter (HCC) 2007   Band keratopathy    Benign prostatic hypertrophy    Chronic ear infection    Chronic kidney disease    Chronic prostatitis    Dental bridge present    permanent - upper   Diabetes mellitus    Elbow stiffness, left    s/p fracture many yrs ago.  arm does not straighten   Encounter for anticoagulation discussion and counseling 07/21/2010   Fall 12/04/2021   GERD (gastroesophageal reflux disease)    laryngeal involvement   Hyperlipidemia    Hypertension    Obstructive sleep apnea    CPAP-9   Orchitis of right testicle 11/30/2020   Osteoarthrosis, localized, primary, knee    post-traumatic   Prostatitis    Sinus drainage 03/11/2022   Stroke [facility])    TIA   Tachy-brady syndrome (HCC)    UTI (urinary tract infection)    Past Surgical History:  Procedure Laterality Date   APPENDECTOMY     BELPHAROPTOSIS REPAIR     Dr [name]---didn't resolve weepy eye and eyelid drooping   CARDIOVERSION  04/13/2010   CATARACT EXTRACTION, BILATERAL  2009   Chest pain  8/12   Stress test benign   ESOPHAGEAL DILATION  05/26/2016   Procedure: ESOPHAGEAL DILATION;  Surgeon: Marnee Sink, MD;  Location: [facility];  Service: Endoscopy;;   ESOPHAGOGASTRODUODENOSCOPY (EGD) WITH PROPOFOL  N/A 05/26/2016   Procedure: ESOPHAGOGASTRODUODENOSCOPY (EGD) WITH PROPOFOL ;  Surgeon: Marnee Sink, MD;  Location: [facility];  Service: Endoscopy;  Laterality: N/A;  Diabetic - oral meds sleep apnea   EYE SURGERY     FRACTURE SURGERY Left    elbow   KNEE SURGERY  1998   plate after fracture, then removed for infection   left elbow surgery     MASTOIDECTOMY  8/08   Dr Gwynda Leriche   PACEMAKER IMPLANT N/A 09/02/2021   Procedure: PACEMAKER IMPLANT;  Surgeon: Boyce Byes,  MD;  Location: [facility] INVASIVE CV LAB;  Service: Cardiovascular;  Laterality: N/A;   RHINOPLASTY  5/10   and septoplasty   SUBACROMIAL DECOMPRESSION Right 2005   Arthroscopic (for rotator cuff and biceps tendon ruptures)   TEAR DUCT PROBING  11/13   Dr Althea Atkinson   TOTAL KNEE ARTHROPLASTY Left 09/01/2014   Procedure: TOTAL KNEE ARTHROPLASTY;  Surgeon: Arlyne Lame, MD;  Location: [facility];  Service: Orthopedics;  Laterality: Left;   TRIGGER FINGER RELEASE Left 02/25/2015   Procedure: LEFT LONG TRIGGER RELEASE;  Surgeon: Arlyne Lame, MD;  Location: [facility];  Service: Orthopedics;  Laterality: Left;   VENOUS ABLATION      Allergies  Allergen Reactions   Lovenox [Enoxaparin] Hives   Proscar  [Finasteride ] Other (See Comments)    Upper body and arm weakness   Latex Rash    Has trouble with BANDAIDS that have been left on for more than 24 hours. Prefers paper tape.   Nickel Rash    Localized rash   Percocet [Oxycodone-Acetaminophen ] Rash   Tape Rash and Other (See Comments)    Sensitivity     Outpatient Encounter Medications as of 05/19/2023  Medication Sig   acetaminophen  (TYLENOL ) 500 MG tablet Take 1,000 mg by mouth every 8 (eight) hours as needed.   apixaban  (ELIQUIS ) 2.5 MG TABS tablet Take 2.5 mg by mouth 2 (two) times daily.   APPLE CIDER VINEGAR PO Take 1 capsule by mouth at bedtime.   atorvastatin  (LIPITOR ) 80 MG tablet Take 1 tablet (80 mg total) by mouth daily.   Calcium  Polycarbophil (FIBER-CAPS PO) Take 1 capsule by mouth every morning. For stool bulking   chlorhexidine  (PERIDEX ) 0.12 % solution    Cranberry 250 MG TABS Take 1 tablet by mouth 2 (two) times daily.   Cyanocobalamin  (B-12 PO) Take 1,000 mg by mouth daily.   finasteride  (PROSCAR ) 5 MG tablet Take 1 tablet (5 mg total) by mouth daily.   guaiFENesin  (MUCINEX ) 600 MG 12 hr tablet Take 600 mg by mouth every 8 (eight) hours as needed.   guaifenesin  (ROBITUSSIN) 100 MG/5ML syrup Take 10 mLs by mouth every 4 (four)  hours as needed for cough.   lactose free nutrition (BOOST) LIQD Take 237 mLs by mouth 3 (three) times daily between meals.   lidocaine  (LIDODERM ) 5 % Place 1 patch onto the skin daily. Remove & Discard patch within 12 hours or as directed by MD   Magnesium  Oxide 400 MG CAPS Take 1 capsule (400 mg total) by mouth daily.   metFORMIN  (GLUCOPHAGE ) 500 MG tablet TAKE 1 TABLET BY MOUTH  TWICE DAILY WITH MEALS   midodrine (PROAMATINE) 5 MG tablet Take 5 mg by mouth 3 (three) times daily with meals as needed.   mineral oil-hydrophilic petrolatum (AQUAPHOR) ointment Apply 1 Application topically daily as needed for dry skin. To left lower leg  Polyethyl Glycol-Propyl Glycol (SYSTANE ULTRA OP) Place 1 drop into both eyes daily as needed (dry eyes).   polyethylene glycol (MIRALAX  / GLYCOLAX ) 17 g packet Take 17 g by mouth every 12 (twelve) hours as needed. Daily every Monday, Wednesday and Friday.   saccharomyces boulardii (FLORASTOR) 250 MG capsule Take 250 mg by mouth daily.   sertraline (ZOLOFT) 50 MG tablet Take 50 mg by mouth daily.   sodium chloride  (OCEAN) 0.65 % SOLN nasal spray Place 2 sprays into both nostrils 2 (two) times daily as needed for congestion.   Sodium Fluoride (PREVIDENT 5000 BOOSTER PLUS) 1.1 % PSTE Place 1 application  onto teeth at bedtime as needed.   tamsulosin  (FLOMAX ) 0.4 MG CAPS capsule Take 2 capsules (0.8 mg total) by mouth at bedtime.   Wheat Dextrin (BENEFIBER) POWD Take by mouth.  Give 1 Tbsp by mouth one time a day on odd days for bowel regulation   clindamycin (CLEOCIN T) 1 % external solution Apply topically 2 (two) times daily. (Patient not taking: Reported on 05/19/2023)   silver  sulfADIAZINE  (SILVADENE ) 1 % cream Apply topically. (Patient not taking: Reported on 05/19/2023)   traMADol  (ULTRAM ) 50 MG tablet Take by mouth. (Patient not taking: Reported on 05/19/2023)   No facility-administered encounter medications on file as of 05/19/2023.    Review of  Systems  Immunization History  Administered Date(s) Administered   Fluad Quad(high Dose 65+) 09/14/2018   H1N1 12/13/2007   Influenza Split 09/11/2010   Influenza Whole 09/12/2011   Influenza, High Dose Seasonal PF 09/20/2012, 09/07/2017   Influenza, Seasonal, Injecte, Preservative Fre 09/08/2007, 09/26/2008, 10/02/2014, 09/18/2015   Influenza,inj,Quad PF,6+ Mos 09/19/2013, 09/21/2015   Influenza-Unspecified 09/07/2017, 09/11/2019, 09/03/2020, 10/19/2021, 10/26/2022   Moderna Sars-Covid-2 Vaccination 01/18/2019, 02/15/2019, 11/19/2019, 10/17/2020, 06/01/2021   PNEUMOCOCCAL CONJUGATE-20 12/13/2021   Pneumococcal Conjugate-13 09/19/2013   Pneumococcal Polysaccharide-23 09/26/2003, 06/28/2016   Td 08/04/1998   Tdap 06/04/2010, 08/15/2021   Tetanus 08/04/1998   Unspecified SARS-COV-2 Vaccination 06/01/2021, 09/30/2022   Zoster Recombinant(Shingrix) 06/29/2022   Zoster, Live 01/16/2004   Pertinent  Health Maintenance Due  Topic Date Due   OPHTHALMOLOGY EXAM  06/01/2023   INFLUENZA VACCINE  08/04/2023   HEMOGLOBIN A1C  11/08/2023   FOOT EXAM  02/08/2024      12/05/2021   10:38 PM 12/06/2021    7:05 AM 12/06/2021    7:40 PM 12/07/2021    8:45 AM 06/28/2022    2:03 PM  Fall Risk  Falls in the past year?     1  Was there an injury with Fall?     1  Fall Risk Category Calculator     3  (RETIRED) Patient Fall Risk Level Moderate fall risk High fall risk High fall risk High fall risk   Patient at Risk for Falls Due to     History of fall(s);Impaired balance/gait;Impaired mobility;Impaired vision   Functional Status Survey:    Vitals:   05/19/23 1408  BP: 105/60  Pulse: 60  Resp: 16  Temp: 97.8 F (36.6 C)  SpO2: 94%  Weight: 164 lb 9.6 oz (74.7 kg)  Height: 5\' 10"  (1.778 m)   Body mass index is 23.62 kg/m. Physical Exam Constitutional:      Comments: Thin, frail  Cardiovascular:     Rate and Rhythm: Normal rate.  Pulmonary:     Effort: Pulmonary effort is normal.   Neurological:     Mental Status: He is alert and oriented to person, place, and time. Mental status is at  baseline.     Labs reviewed: Recent Labs    11/07/22 0000 05/08/23 0000 05/18/23 0000  NA 138 138 139  K 4.0 4.2 4.1  CL 104 105 105  CO2 29* 30* 27*  BUN 38* 26* 30*  CREATININE 0.9 0.8 0.7  CALCIUM   --  9.9 10.0   Recent Labs    11/07/22 0000 05/08/23 0000  AST 13* 12*  ALT 13 9*  ALKPHOS 63 64  ALBUMIN 3.4* 3.2*   Recent Labs    05/08/23 0000 05/15/23 0000 05/18/23 0000  WBC 4.1 5.9 4.3  NEUTROABS 1,915.00  --   --   HGB 6.9* 7.5* 6.9*  HCT 24* 25* 23*  PLT 98* 139* 147*   Lab Results  Component Value Date   TSH 1.16 05/08/2023   Lab Results  Component Value Date   HGBA1C 6.9 05/08/2023   Lab Results  Component Value Date   CHOL 95 11/07/2022   HDL 59 11/07/2022   LDLCALC 27 11/07/2022   TRIG 31 (A) 11/07/2022   CHOLHDL 2 02/25/2021    Significant Diagnostic Results in last 30 days:  No results found.  Assessment/Plan  Anemia Chronic anemia with hemoglobin level at 6.9, necessitating transfusion. Recent tooth extractions with significant bleeding may have contributed. No gastrointestinal bleeding in stool. Potential underlying causes include blood dyscrasia or cancer. Risks of declining transfusion include further hemoglobin drop and potential symptoms. - Start oral iron supplementation over the weekend - Ensure hematologist is aware of preference to avoid hospital visits - Proceed with hematology appointment on Tuesday for further evaluation and potential transfusion  Possibility of blood dyscrasia Blood dyscrasia considered due to potential inadequate blood production, given age and rapid hemoglobin decline.  Possibility of cancer Cancer considered due to age and rapid hemoglobin decline. CT scan could identify malignancy causing increased blood usage. Discussed bone biopsy for blood cancers, noting pain but potential diagnostic  value.  Hx of CHF, Atrial fibrillation Given patient's cardiac cundition, likely needs Hgb >8.   Goals of Care Prefers to avoid hospital visits and transfusions unless necessary. Open to oral iron supplementation and awaiting hematology evaluation. Acknowledges risks of declining immediate transfusion, including further health decline. - Discuss preferences with family and ensure they are informed of current health status and plan - DNH ordered  Family/ staff Communication: Son david, nursing,   Labs/tests ordered: none   I spent greater than 30  minutes for the care of this patient in face to face time, chart review, clinical documentation, patient education. I spent an additional 16 minutes discussing goals of care and advanced care planning.

## 2023-05-20 ENCOUNTER — Encounter: Payer: Self-pay | Admitting: Student

## 2023-05-23 ENCOUNTER — Other Ambulatory Visit: Payer: Self-pay

## 2023-05-23 ENCOUNTER — Inpatient Hospital Stay

## 2023-05-23 ENCOUNTER — Encounter: Payer: Self-pay | Admitting: Oncology

## 2023-05-23 ENCOUNTER — Inpatient Hospital Stay: Attending: Oncology | Admitting: Oncology

## 2023-05-23 VITALS — BP 118/65

## 2023-05-23 VITALS — BP 114/53 | HR 61 | Temp 96.8°F | Resp 19 | Ht 70.0 in | Wt 167.0 lb

## 2023-05-23 DIAGNOSIS — D509 Iron deficiency anemia, unspecified: Secondary | ICD-10-CM | POA: Diagnosis not present

## 2023-05-23 DIAGNOSIS — I10 Essential (primary) hypertension: Secondary | ICD-10-CM

## 2023-05-23 DIAGNOSIS — I4891 Unspecified atrial fibrillation: Secondary | ICD-10-CM | POA: Diagnosis not present

## 2023-05-23 DIAGNOSIS — K219 Gastro-esophageal reflux disease without esophagitis: Secondary | ICD-10-CM

## 2023-05-23 DIAGNOSIS — Z800 Family history of malignant neoplasm of digestive organs: Secondary | ICD-10-CM

## 2023-05-23 DIAGNOSIS — Z79899 Other long term (current) drug therapy: Secondary | ICD-10-CM | POA: Diagnosis not present

## 2023-05-23 DIAGNOSIS — E1159 Type 2 diabetes mellitus with other circulatory complications: Secondary | ICD-10-CM

## 2023-05-23 DIAGNOSIS — Z7901 Long term (current) use of anticoagulants: Secondary | ICD-10-CM

## 2023-05-23 DIAGNOSIS — Z87891 Personal history of nicotine dependence: Secondary | ICD-10-CM

## 2023-05-23 LAB — CBC (CANCER CENTER ONLY)
HCT: 25.9 % — ABNORMAL LOW (ref 39.0–52.0)
Hemoglobin: 7.7 g/dL — ABNORMAL LOW (ref 13.0–17.0)
MCH: 25.7 pg — ABNORMAL LOW (ref 26.0–34.0)
MCHC: 29.7 g/dL — ABNORMAL LOW (ref 30.0–36.0)
MCV: 86.3 fL (ref 80.0–100.0)
Platelet Count: 177 10*3/uL (ref 150–400)
RBC: 3 MIL/uL — ABNORMAL LOW (ref 4.22–5.81)
RDW: 15.8 % — ABNORMAL HIGH (ref 11.5–15.5)
WBC Count: 5.1 10*3/uL (ref 4.0–10.5)
nRBC: 0 % (ref 0.0–0.2)

## 2023-05-23 LAB — SAMPLE TO BLOOD BANK

## 2023-05-23 MED ORDER — SODIUM CHLORIDE 0.9 % IV SOLN
510.0000 mg | INTRAVENOUS | Status: DC
  Administered 2023-05-23: 510 mg via INTRAVENOUS
  Filled 2023-05-23: qty 510

## 2023-05-23 NOTE — Patient Instructions (Signed)

## 2023-05-23 NOTE — Progress Notes (Signed)
 Hematology/Oncology Consult note [facility] Telephone:([phone] [phone]  Patient Care Team: Valrie Gehrig, MD as PCP - General (Family Medicine) Devorah Fonder, MD as PCP - Cardiology (Cardiology) Devorah Fonder, MD as Consulting Physician (Cardiology) Avonne Boettcher, MD as Consulting Physician (Hematology and Oncology)   Name of the patient: Michael Colon  [id number]  [birth year]    Reason for referral-anemia   Referring physician-Dr. Jann Melody  Date of visit: 05/23/23   History of presenting illness-patient is a 89 year old male with a past medical history significant for hypertension hyperlipidemia, A-fib, type 2 diabetes, GERD among other medical problems.  He has been referred for anemia.  CBC from 05/18/2023 showed white count of 4.3, H&H of 6.9/23 with a platelet count of 147.Patient's baseline hemoglobin has been around 10-11 since December 2023.  Prior to that he was normal at 14.  Platelet counts have been fluctuating between 100-140 since 2021 and that has not changed recently.  I do not have his MCV for his labs.  B12 levels normal at 768.  Ferritin and iron studies showed an iron saturation of 4% and ferritin level of 8.  TSH normal at 1.16  He is a resident of nursing home.  Denies any bright red blood in stools.  Denies any dark melanotic stools.  Denies any hematuria  ECOG PS- 3  Pain scale- 0   Review of systems- Review of Systems  Constitutional:  Positive for malaise/fatigue. Negative for chills, fever and weight loss.  HENT:  Negative for congestion, ear discharge and nosebleeds.   Eyes:  Negative for blurred vision.  Respiratory:  Negative for cough, hemoptysis, sputum production, shortness of breath and wheezing.   Cardiovascular:  Negative for chest pain, palpitations, orthopnea and claudication.  Gastrointestinal:  Negative for abdominal pain, blood in stool, constipation, diarrhea, heartburn, melena, nausea and  vomiting.  Genitourinary:  Negative for dysuria, flank pain, frequency, hematuria and urgency.  Musculoskeletal:  Negative for back pain, joint pain and myalgias.  Skin:  Negative for rash.  Neurological:  Negative for dizziness, tingling, focal weakness, seizures, weakness and headaches.  Endo/Heme/Allergies:  Does not bruise/bleed easily.  Psychiatric/Behavioral:  Negative for depression and suicidal ideas. The patient does not have insomnia.     Allergies  Allergen Reactions   Lovenox [Enoxaparin] Hives   Proscar  [Finasteride ] Other (See Comments)    Upper body and arm weakness   Latex Rash    Has trouble with BANDAIDS that have been left on for more than 24 hours. Prefers paper tape.   Nickel Rash    Localized rash   Percocet [Oxycodone-Acetaminophen ] Rash   Tape Rash and Other (See Comments)    Sensitivity     Patient Active Problem List   Diagnosis Date Noted   Presence of heart assist device (HCC) 02/18/2023   HLD (hyperlipidemia) 12/04/2021   Stroke (HCC) 12/04/2021   Atrial fibrillation, chronic (HCC) 12/04/2021   Complete heart block (HCC) 09/01/2021   Onychomycosis 11/30/2020   Chronic diastolic heart failure (HCC) 10/25/2019   Aortic atherosclerosis (HCC) 12/28/2016   Chronic venous insufficiency 04/12/2016   Thrombocytopenia (HCC) 06/25/2015   Obstructive sleep apnea 06/25/2015   TIA (transient ischemic attack) 12/11/2012   Ventricular tachycardia-pause dependent 10/16/2012   Mixed hyperlipidemia    Type 2 diabetes, controlled, with neuropathy (HCC)    Tachy-brady syndrome (HCC)    BPH with obstruction/lower urinary tract symptoms    Asthma    GERD (gastroesophageal reflux disease)  Essential hypertension      Past Medical History:  Diagnosis Date   Asthma    Atrial flutter (HCC) 2007   Band keratopathy    Benign prostatic hypertrophy    Chronic ear infection    Chronic kidney disease    Chronic prostatitis    Dental bridge present    permanent  - upper   Diabetes mellitus    Elbow stiffness, left    s/p fracture many yrs ago.  arm does not straighten   Encounter for anticoagulation discussion and counseling 07/21/2010   Fall 12/04/2021   GERD (gastroesophageal reflux disease)    laryngeal involvement   Hyperlipidemia    Hypertension    Obstructive sleep apnea    CPAP-9   Orchitis of right testicle 11/30/2020   Osteoarthrosis, localized, primary, knee    post-traumatic   Prostatitis    Sinus drainage 03/11/2022   Stroke [facility])    TIA   Tachy-brady syndrome (HCC)    UTI (urinary tract infection)      Past Surgical History:  Procedure Laterality Date   APPENDECTOMY     BELPHAROPTOSIS REPAIR     Dr [name]---didn't resolve weepy eye and eyelid drooping   CARDIOVERSION  04/13/2010   CATARACT EXTRACTION, BILATERAL  2009   Chest pain  8/12   Stress test benign   ESOPHAGEAL DILATION  05/26/2016   Procedure: ESOPHAGEAL DILATION;  Surgeon: Marnee Sink, MD;  Location: [facility];  Service: Endoscopy;;   ESOPHAGOGASTRODUODENOSCOPY (EGD) WITH PROPOFOL  N/A 05/26/2016   Procedure: ESOPHAGOGASTRODUODENOSCOPY (EGD) WITH PROPOFOL ;  Surgeon: Marnee Sink, MD;  Location: [facility];  Service: Endoscopy;  Laterality: N/A;  Diabetic - oral meds sleep apnea   EYE SURGERY     FRACTURE SURGERY Left    elbow   KNEE SURGERY  1998   plate after fracture, then removed for infection   left elbow surgery     MASTOIDECTOMY  8/08   Dr Gwynda Leriche   PACEMAKER IMPLANT N/A 09/02/2021   Procedure: PACEMAKER IMPLANT;  Surgeon: Boyce Byes, MD;  Location: [facility] INVASIVE CV LAB;  Service: Cardiovascular;  Laterality: N/A;   RHINOPLASTY  5/10   and septoplasty   SUBACROMIAL DECOMPRESSION Right 2005   Arthroscopic (for rotator cuff and biceps tendon ruptures)   TEAR DUCT PROBING  11/13   Dr Althea Atkinson   TOTAL KNEE ARTHROPLASTY Left 09/01/2014   Procedure: TOTAL KNEE ARTHROPLASTY;  Surgeon: Arlyne Lame, MD;  Location: [facility] ORS;   Service: Orthopedics;  Laterality: Left;   TRIGGER FINGER RELEASE Left 02/25/2015   Procedure: LEFT LONG TRIGGER RELEASE;  Surgeon: Arlyne Lame, MD;  Location: [facility] ORS;  Service: Orthopedics;  Laterality: Left;   VENOUS ABLATION      Social History   Socioeconomic History   Marital status: Widowed    Spouse name: Not on file   Number of children: 2   Years of education: Not on file   Highest education level: Not on file  Occupational History   Occupation: Education officer, environmental    Comment: Retired--[employer]  Tobacco Use   Smoking status: Former    Current packs/day: 0.00    Types: Cigarettes    Start date: 01/03/1969    Quit date: 01/03/1969    Years since quitting: 54.4    Passive exposure: Never   Smokeless tobacco: Never  Vaping Use   Vaping status: Never Used  Substance and Sexual Activity   Alcohol  use: No    Alcohol /week: 1.0 standard drink of  alcohol     Types: 1 Glasses of wine per week    Comment: rare wine. 1-2x/yr.   Drug use: No   Sexual activity: Not Currently  Other Topics Concern   Not on file  Social History Narrative   Has living will   Health care POA-- son Myrtie Atkinson   Has DNR order from the past---form redone 06/25/10   Probably no feeding tube if cognitively unaware   Social Drivers of Health   Financial Resource Strain: Not on file  Food Insecurity: Patient Declined (12/05/2021)   Hunger Vital Sign    Worried About Running Out of Food in the Last Year: Patient declined    Ran Out of Food in the Last Year: Patient declined  Transportation Needs: No Transportation Needs (12/05/2021)   PRAPARE - Administrator, Civil Service (Medical): No    Lack of Transportation (Non-Medical): No  Physical Activity: Not on file  Stress: Not on file  Social Connections: Not on file  Intimate Partner Violence: Not At Risk (12/05/2021)   Humiliation, Afraid, Rape, and Kick questionnaire    Fear of Current or Ex-Partner: No    Emotionally Abused: No    Physically Abused:  No    Sexually Abused: No     Family History  Problem Relation Age of Onset   Colon cancer Father    Diabetes Father    Hypertension Father    Other Mother        natural causes   Heart attack Neg Hx    Stroke Neg Hx      Current Outpatient Medications:    acetaminophen  (TYLENOL ) 500 MG tablet, Take 1,000 mg by mouth every 8 (eight) hours as needed., Disp: , Rfl:    apixaban  (ELIQUIS ) 2.5 MG TABS tablet, Take 2.5 mg by mouth 2 (two) times daily., Disp: , Rfl:    APPLE CIDER VINEGAR PO, Take 1 capsule by mouth at bedtime., Disp: , Rfl:    atorvastatin  (LIPITOR ) 80 MG tablet, Take 1 tablet (80 mg total) by mouth daily., Disp: 90 tablet, Rfl: 3   Calcium  Polycarbophil (FIBER-CAPS PO), Take 1 capsule by mouth every morning. For stool bulking, Disp: , Rfl:    chlorhexidine  (PERIDEX ) 0.12 % solution, , Disp: , Rfl:    clindamycin (CLEOCIN T) 1 % external solution, Apply topically 2 (two) times daily. (Patient not taking: Reported on 05/19/2023), Disp: , Rfl:    Cranberry 250 MG TABS, Take 1 tablet by mouth 2 (two) times daily., Disp: , Rfl:    Cyanocobalamin  (B-12 PO), Take 1,000 mg by mouth daily., Disp: , Rfl:    finasteride  (PROSCAR ) 5 MG tablet, Take 1 tablet (5 mg total) by mouth daily., Disp: 90 tablet, Rfl: 3   guaiFENesin  (MUCINEX ) 600 MG 12 hr tablet, Take 600 mg by mouth every 8 (eight) hours as needed., Disp: , Rfl:    guaifenesin  (ROBITUSSIN) 100 MG/5ML syrup, Take 10 mLs by mouth every 4 (four) hours as needed for cough., Disp: , Rfl:    lactose free nutrition (BOOST) LIQD, Take 237 mLs by mouth 3 (three) times daily between meals., Disp: , Rfl:    lidocaine  (LIDODERM ) 5 %, Place 1 patch onto the skin daily. Remove & Discard patch within 12 hours or as directed by MD, Disp: 30 patch, Rfl: 0   Magnesium  Oxide 400 MG CAPS, Take 1 capsule (400 mg total) by mouth daily., Disp: 90 capsule, Rfl: 3   metFORMIN  (GLUCOPHAGE ) 500 MG tablet, TAKE 1  TABLET BY MOUTH  TWICE DAILY WITH MEALS,  Disp: 180 tablet, Rfl: 3   midodrine (PROAMATINE) 5 MG tablet, Take 5 mg by mouth 3 (three) times daily with meals as needed., Disp: , Rfl:    mineral oil-hydrophilic petrolatum (AQUAPHOR) ointment, Apply 1 Application topically daily as needed for dry skin. To left lower leg, Disp: , Rfl:    Polyethyl Glycol-Propyl Glycol (SYSTANE ULTRA OP), Place 1 drop into both eyes daily as needed (dry eyes)., Disp: , Rfl:    polyethylene glycol (MIRALAX  / GLYCOLAX ) 17 g packet, Take 17 g by mouth every 12 (twelve) hours as needed. Daily every Monday, Wednesday and Friday., Disp: , Rfl:    saccharomyces boulardii (FLORASTOR) 250 MG capsule, Take 250 mg by mouth daily., Disp: , Rfl:    sertraline (ZOLOFT) 50 MG tablet, Take 50 mg by mouth daily., Disp: , Rfl:    silver  sulfADIAZINE  (SILVADENE ) 1 % cream, Apply topically. (Patient not taking: Reported on 05/19/2023), Disp: , Rfl:    sodium chloride  (OCEAN) 0.65 % SOLN nasal spray, Place 2 sprays into both nostrils 2 (two) times daily as needed for congestion., Disp: , Rfl:    Sodium Fluoride (PREVIDENT 5000 BOOSTER PLUS) 1.1 % PSTE, Place 1 application  onto teeth at bedtime as needed., Disp: , Rfl:    tamsulosin  (FLOMAX ) 0.4 MG CAPS capsule, Take 2 capsules (0.8 mg total) by mouth at bedtime., Disp: 90 capsule, Rfl: 6   traMADol  (ULTRAM ) 50 MG tablet, Take by mouth. (Patient not taking: Reported on 05/19/2023), Disp: , Rfl:    Wheat Dextrin (BENEFIBER) POWD, Take by mouth.  Give 1 Tbsp by mouth one time a day on odd days for bowel regulation, Disp: , Rfl:    Physical exam: There were no vitals filed for this visit. Physical Exam Cardiovascular:     Rate and Rhythm: Normal rate. Rhythm irregular.     Heart sounds: Normal heart sounds.  Pulmonary:     Effort: Pulmonary effort is normal.     Breath sounds: Normal breath sounds.  Abdominal:     General: Bowel sounds are normal.     Palpations: Abdomen is soft.  Skin:    General: Skin is warm and dry.   Neurological:     Mental Status: He is alert and oriented to person, place, and time.           Latest Ref Rng & Units 05/18/2023   12:00 AM  CMP  BUN 4 - 21 30      Creatinine 0.6 - 1.3 0.7      Sodium 137 - 147 139      Potassium 3.5 - 5.1 mEq/L 4.1      Chloride 99 - 108 105      CO2 13 - 22 27      Calcium  8.7 - 10.7 10.0         This result is from an external source.      Latest Ref Rng & Units 05/18/2023   12:00 AM  CBC  WBC  4.3      Hemoglobin 13.5 - 17.5 6.9      Hematocrit 41 - 53 23      Platelets 150 - 400 K/uL 147         This result is from an external source.      Assessment and plan- Patient is a 89 y.o. male referred for iron deficiency anemia  Patient's baseline hemoglobin was around 14 up until  2023 and since then it has been mostly between 10-11.  He was noted to have a hemoglobin of 6.9 on 05/18/2023.  Labs are clearly indicative of iron deficiency anemia.  B12 levels and thyroid  levels were normal.  We discussed risks and benefits of IV iron including all but not limited to possible risk of infusion and anaphylactic reaction.  Patient understands and agrees to proceed as planned.  We will repeat CBC and hold tube for possible transfusion today.  If his hemoglobin is less than 7 we will give him a unit of blood transfusion before proceeding with IV iron.  Given his age I am holding off on endoscopic evaluation at this time.  He is on Eliquis  2.5 mg twice daily for A-fib.  I will monitor his CBC every 2 weeks.  If hemoglobin fails to improve despite IV iron we will have him hold his Eliquis  at that point   Thank you for this kind referral and the opportunity to participate in the care of this patient   Visit Diagnosis 1. Iron deficiency anemia, unspecified iron deficiency anemia type     Dr. Seretha Dance, MD, MPH [facility] at [facility] [phone] 05/23/2023

## 2023-05-24 ENCOUNTER — Telehealth: Payer: Self-pay | Admitting: *Deleted

## 2023-05-24 NOTE — Telephone Encounter (Signed)
 I am ok with that.

## 2023-05-24 NOTE — Telephone Encounter (Signed)
 Michael Colon wants to know if every time that it is just labs they can do them at [facility] and he does not have to come out appointments that has been set up right now is June 3 then June 17 and 7/11 are all just lab on those dates.  If that is approved from Dr. Randy Buttery then the fax number for the cancer center [phone] and Michael Colon will fax the results to cancer center.  Michael Colon will have to have a paper saying what kind of labs they eating from Dr. Randy Buttery

## 2023-05-25 ENCOUNTER — Inpatient Hospital Stay

## 2023-05-31 ENCOUNTER — Inpatient Hospital Stay

## 2023-05-31 VITALS — BP 117/66 | HR 60 | Temp 96.6°F | Resp 18

## 2023-05-31 DIAGNOSIS — D509 Iron deficiency anemia, unspecified: Secondary | ICD-10-CM

## 2023-05-31 DIAGNOSIS — Z79899 Other long term (current) drug therapy: Secondary | ICD-10-CM | POA: Diagnosis not present

## 2023-05-31 MED ORDER — SODIUM CHLORIDE 0.9 % IV SOLN
510.0000 mg | INTRAVENOUS | Status: DC
  Administered 2023-05-31: 510 mg via INTRAVENOUS
  Filled 2023-05-31: qty 510

## 2023-05-31 MED ORDER — SODIUM CHLORIDE 0.9 % IV SOLN
Freq: Once | INTRAVENOUS | Status: AC
  Filled 2023-05-31: qty 250

## 2023-06-05 DIAGNOSIS — D649 Anemia, unspecified: Secondary | ICD-10-CM | POA: Diagnosis not present

## 2023-06-05 LAB — CBC AND DIFFERENTIAL
HCT: 31 — AB (ref 41–53)
Hemoglobin: 9.4 — AB (ref 13.5–17.5)
Platelets: 120 10*3/uL — AB (ref 150–400)
WBC: 6

## 2023-06-05 LAB — CBC: RBC: 3.47 — AB (ref 3.87–5.11)

## 2023-06-06 ENCOUNTER — Encounter: Payer: Self-pay | Admitting: Oncology

## 2023-06-06 ENCOUNTER — Other Ambulatory Visit

## 2023-06-09 ENCOUNTER — Encounter: Payer: Self-pay | Admitting: Student

## 2023-06-09 NOTE — Progress Notes (Signed)
 This encounter was created in error - please disregard.

## 2023-06-12 ENCOUNTER — Ambulatory Visit (INDEPENDENT_AMBULATORY_CARE_PROVIDER_SITE_OTHER): Payer: Medicare Other

## 2023-06-12 DIAGNOSIS — I442 Atrioventricular block, complete: Secondary | ICD-10-CM

## 2023-06-12 DIAGNOSIS — I495 Sick sinus syndrome: Secondary | ICD-10-CM

## 2023-06-13 LAB — CUP PACEART REMOTE DEVICE CHECK
Battery Remaining Longevity: 102 mo
Battery Remaining Percentage: 100 %
Brady Statistic RA Percent Paced: 20 %
Brady Statistic RV Percent Paced: 93 %
Date Time Interrogation Session: 20250609000100
Implantable Lead Connection Status: 753985
Implantable Lead Connection Status: 753985
Implantable Lead Implant Date: 20230831
Implantable Lead Implant Date: 20230831
Implantable Lead Location: 753859
Implantable Lead Location: 753860
Implantable Lead Model: 7841
Implantable Lead Model: 7842
Implantable Lead Serial Number: 1220926
Implantable Lead Serial Number: 1322603
Implantable Pulse Generator Implant Date: 20230831
Lead Channel Impedance Value: 481 Ohm
Lead Channel Impedance Value: 504 Ohm
Lead Channel Pacing Threshold Amplitude: 0.5 V
Lead Channel Pacing Threshold Amplitude: 1.1 V
Lead Channel Pacing Threshold Pulse Width: 0.4 ms
Lead Channel Pacing Threshold Pulse Width: 0.4 ms
Lead Channel Setting Pacing Amplitude: 3.5 V
Lead Channel Setting Pacing Amplitude: 3.5 V
Lead Channel Setting Pacing Pulse Width: 0.4 ms
Lead Channel Setting Sensing Sensitivity: 3.5 mV
Pulse Gen Serial Number: 613820
Zone Setting Status: 755011

## 2023-06-16 ENCOUNTER — Ambulatory Visit: Payer: Self-pay | Admitting: Cardiology

## 2023-06-16 ENCOUNTER — Non-Acute Institutional Stay (SKILLED_NURSING_FACILITY): Payer: Self-pay | Admitting: Student

## 2023-06-16 ENCOUNTER — Encounter: Payer: Self-pay | Admitting: Student

## 2023-06-16 DIAGNOSIS — I5032 Chronic diastolic (congestive) heart failure: Secondary | ICD-10-CM

## 2023-06-16 DIAGNOSIS — D696 Thrombocytopenia, unspecified: Secondary | ICD-10-CM | POA: Diagnosis not present

## 2023-06-16 DIAGNOSIS — B351 Tinea unguium: Secondary | ICD-10-CM

## 2023-06-16 DIAGNOSIS — I700 Atherosclerosis of aorta: Secondary | ICD-10-CM | POA: Diagnosis not present

## 2023-06-16 DIAGNOSIS — S42402D Unspecified fracture of lower end of left humerus, subsequent encounter for fracture with routine healing: Secondary | ICD-10-CM

## 2023-06-16 DIAGNOSIS — I10 Essential (primary) hypertension: Secondary | ICD-10-CM | POA: Diagnosis not present

## 2023-06-16 DIAGNOSIS — G3184 Mild cognitive impairment, so stated: Secondary | ICD-10-CM

## 2023-06-16 DIAGNOSIS — I872 Venous insufficiency (chronic) (peripheral): Secondary | ICD-10-CM | POA: Diagnosis not present

## 2023-06-16 DIAGNOSIS — E1140 Type 2 diabetes mellitus with diabetic neuropathy, unspecified: Secondary | ICD-10-CM | POA: Diagnosis not present

## 2023-06-16 DIAGNOSIS — K219 Gastro-esophageal reflux disease without esophagitis: Secondary | ICD-10-CM | POA: Diagnosis not present

## 2023-06-16 DIAGNOSIS — D509 Iron deficiency anemia, unspecified: Secondary | ICD-10-CM

## 2023-06-16 DIAGNOSIS — Z95811 Presence of heart assist device: Secondary | ICD-10-CM

## 2023-06-16 DIAGNOSIS — I482 Chronic atrial fibrillation, unspecified: Secondary | ICD-10-CM | POA: Diagnosis not present

## 2023-06-16 DIAGNOSIS — F324 Major depressive disorder, single episode, in partial remission: Secondary | ICD-10-CM

## 2023-06-16 DIAGNOSIS — E440 Moderate protein-calorie malnutrition: Secondary | ICD-10-CM

## 2023-06-16 DIAGNOSIS — N401 Enlarged prostate with lower urinary tract symptoms: Secondary | ICD-10-CM

## 2023-06-16 DIAGNOSIS — N138 Other obstructive and reflux uropathy: Secondary | ICD-10-CM

## 2023-06-16 DIAGNOSIS — Z8673 Personal history of transient ischemic attack (TIA), and cerebral infarction without residual deficits: Secondary | ICD-10-CM

## 2023-06-16 NOTE — Progress Notes (Signed)
 Location:  Other [facility]) Nursing Home Room Number: 501 A Place of Service:  SNF [phone]) Provider:  Abdul Fine, MD  Patient Care Team: Abdul Fine, MD as PCP - General (Family Medicine) Perla Evalene PARAS, MD as PCP - Cardiology (Cardiology) Perla Evalene PARAS, MD as Consulting Physician (Cardiology) Melanee Annah BROCKS, MD as Consulting Physician (Hematology and Oncology) [place], [facility] [employer])  Extended Emergency Contact Information Primary Emergency Contact: Rocky Alm [facility]) Address: [address]          [place], [place] [address] [place] Phone: [phone] Relation: Son  Code Status:  DNR Goals of care: Advanced Directive information    05/23/2023   12:24 PM  Advanced Directives  Does Patient Have a Medical Advance Directive? Yes  Type of Estate agent of [place];Living will;Out of facility DNR (pink MOST or yellow form)  Does patient want to make changes to medical advance directive? No - Patient declined  Copy of Healthcare Power of Attorney in Chart? Yes - validated most recent copy scanned in chart (See row information)  Would patient like information on creating a medical advance directive? No - Patient declined     Chief Complaint  Patient presents with   Medical Management of Chronic Issues    Routine visit. Discuss need for second shingrix vaccine, annual wellness visit, and eye exam (request for last exam sent to [facility])     HPI:  Pt is a 89 y.o. male seen today for medical management of chronic diseases.    Resting comfortably. No concerns at this time. He has been seen by hematology for management of anemia. He was last seen on 5/20 for IDA. Discussed benefits of IV iron. Deferred endoscopy at that time. Monitoring hemoglobin every 2 weeks. Hemoglobin started to improve with infusion.   No signs of bleeding at this time. Continues on eliquis  for atrial fibrillation. Past  Medical History:  Diagnosis Date   Asthma    Atrial flutter (HCC) 2007   Band keratopathy    Benign prostatic hypertrophy    Chronic ear infection    Chronic kidney disease    Chronic prostatitis    Dental bridge present    permanent - upper   Diabetes mellitus    Elbow stiffness, left    s/p fracture many yrs ago.  arm does not straighten   Encounter for anticoagulation discussion and counseling 07/21/2010   Fall 12/04/2021   GERD (gastroesophageal reflux disease)    laryngeal involvement   Hyperlipidemia    Hypertension    Obstructive sleep apnea    CPAP-9   Orchitis of right testicle 11/30/2020   Osteoarthrosis, localized, primary, knee    post-traumatic   Prostatitis    Sinus drainage 03/11/2022   Skin cancer    Stroke [facility])    TIA   Tachy-brady syndrome (HCC)    UTI (urinary tract infection)    Past Surgical History:  Procedure Laterality Date   APPENDECTOMY     BELPHAROPTOSIS REPAIR     Dr [name]---didn't resolve weepy eye and eyelid drooping   CARDIOVERSION  04/13/2010   CATARACT EXTRACTION, BILATERAL  2009   Chest pain  8/12   Stress test benign   ESOPHAGEAL DILATION  05/26/2016   Procedure: ESOPHAGEAL DILATION;  Surgeon: Jinny Carmine, MD;  Location: [facility];  Service: Endoscopy;;   ESOPHAGOGASTRODUODENOSCOPY (EGD) WITH PROPOFOL  N/A 05/26/2016   Procedure: ESOPHAGOGASTRODUODENOSCOPY (EGD) WITH PROPOFOL ;  Surgeon: Jinny Carmine, MD;  Location: [facility];  Service: Endoscopy;  Laterality: N/A;  Diabetic - oral meds sleep apnea   EYE SURGERY     FRACTURE SURGERY Left    elbow   KNEE SURGERY  1998   plate after fracture, then removed for infection   left elbow surgery     MASTOIDECTOMY  8/08   Dr Rena   PACEMAKER IMPLANT N/A 09/02/2021   Procedure: PACEMAKER IMPLANT;  Surgeon: Cindie Ole DASEN, MD;  Location: [facility] IN[facility];  Service: Cardiovascular;  Laterality: N/A;   RHINOPLASTY  5/10   and septoplasty   SUBACROMIAL  DECOMPRESSION Right 2005   Arthroscopic (for rotator cuff and biceps tendon ruptures)   TEAR DUCT PROBING  11/13   Dr Ashley   TOTAL KNEE ARTHROPLASTY Left 09/01/2014   Procedure: TOTAL KNEE ARTHROPLASTY;  Surgeon: Lynwood SHAUNNA Hue, MD;  Location: [facility];  Service: Orthopedics;  Laterality: Left;   TRIGGER FINGER RELEASE Left 02/25/2015   Procedure: LEFT LONG TRIGGER RELEASE;  Surgeon: Lynwood SHAUNNA Hue, MD;  Location: [facility];  Service: Orthopedics;  Laterality: Left;   VENOUS ABLATION      Allergies  Allergen Reactions   Lovenox [Enoxaparin] Hives   Proscar  [Finasteride ] Other (See Comments)    Upper body and arm weakness   Latex Rash    Has trouble with BANDAIDS that have been left on for more than 24 hours. Prefers paper tape.   Nickel Rash    Localized rash   Percocet [Oxycodone-Acetaminophen ] Rash   Tape Rash and Other (See Comments)    Sensitivity     Outpatient Encounter Medications as of 06/16/2023  Medication Sig   acetaminophen  (TYLENOL ) 500 MG tablet Take 1,000 mg by mouth every 8 (eight) hours as needed.   apixaban  (ELIQUIS ) 2.5 MG TABS tablet Take 2.5 mg by mouth 2 (two) times daily.   APPLE CIDER VINEGAR PO Take 1 capsule by mouth at bedtime.   atorvastatin  (LIPITOR ) 80 MG tablet Take 1 tablet (80 mg total) by mouth daily.   Cranberry 250 MG TABS Take 1 tablet by mouth 2 (two) times daily.   Cyanocobalamin  (B-12 PO) Take 1,000 mg by mouth daily.   FIBER PO Take 1 capsule by mouth daily.   finasteride  (PROSCAR ) 5 MG tablet Take 1 tablet (5 mg total) by mouth daily.   guaiFENesin  (MUCINEX ) 600 MG 12 hr tablet Take 600 mg by mouth every 8 (eight) hours as needed.   guaifenesin  (ROBITUSSIN) 100 MG/5ML syrup Take 10 mLs by mouth every 4 (four) hours as needed for cough.   IRON, FERROUS GLUCONATE, PO Take 65 mg by mouth daily.   lactose free nutrition (BOOST) LIQD Take 237 mLs by mouth 3 (three) times daily between meals.   lidocaine  (LIDODERM ) 5 % Place 1 patch onto the  skin daily. Remove & Discard patch within 12 hours or as directed by MD   Magnesium  Oxide 400 MG CAPS Take 1 capsule (400 mg total) by mouth daily.   metFORMIN  (GLUCOPHAGE ) 500 MG tablet TAKE 1 TABLET BY MOUTH  TWICE DAILY WITH MEALS   midodrine (PROAMATINE) 5 MG tablet Take 5 mg by mouth 3 (three) times daily with meals as needed.   mineral oil-hydrophilic petrolatum (AQUAPHOR) ointment Apply 1 Application topically daily as needed for dry skin. To left lower leg   Polyethyl Glycol-Propyl Glycol (SYSTANE ULTRA OP) Place 1 drop into both eyes daily as needed (dry eyes).   polyethylene glycol (MIRALAX  / GLYCOLAX ) 17 g packet Take  17 g by mouth every 12 (twelve) hours as needed. Daily every Monday, Wednesday and Friday.   saccharomyces boulardii (FLORASTOR) 250 MG capsule Take 250 mg by mouth daily.   sertraline (ZOLOFT) 50 MG tablet Take 50 mg by mouth daily.   sodium chloride  (OCEAN) 0.65 % SOLN nasal spray Place 2 sprays into both nostrils 2 (two) times daily as needed for congestion.   Sodium Fluoride (PREVIDENT 5000 BOOSTER PLUS) 1.1 % PSTE Place 1 application  onto teeth at bedtime as needed.   tamsulosin  (FLOMAX ) 0.4 MG CAPS capsule Take 2 capsules (0.8 mg total) by mouth at bedtime.   Wheat Dextrin (BENEFIBER) POWD Take by mouth.  Give 1 Tbsp by mouth one time a day on odd days for bowel regulation   Calcium  Polycarbophil (FIBER-CAPS PO) Take 1 capsule by mouth every morning. For stool bulking (Patient not taking: Reported on 06/16/2023)   chlorhexidine  (PERIDEX ) 0.12 % solution  (Patient not taking: Reported on 06/16/2023)   clindamycin (CLEOCIN T) 1 % external solution Apply topically 2 (two) times daily. (Patient not taking: Reported on 06/16/2023)   silver  sulfADIAZINE  (SILVADENE ) 1 % cream Apply topically. (Patient not taking: Reported on 06/16/2023)   traMADol  (ULTRAM ) 50 MG tablet Take by mouth. (Patient not taking: Reported on 06/16/2023)   No facility-administered encounter medications  on file as of 06/16/2023.    Review of Systems  Immunization History  Administered Date(s) Administered   Fluad Quad(high Dose 65+) 09/14/2018   H1N1 12/13/2007   Influenza Split 09/11/2010   Influenza Whole 09/12/2011   Influenza, High Dose Seasonal PF 09/20/2012, 09/07/2017   Influenza, Seasonal, Injecte, Preservative Fre 09/08/2007, 09/26/2008, 10/02/2014, 09/18/2015   Influenza,inj,Quad PF,6+ Mos 09/19/2013, 09/21/2015   Influenza-Unspecified 09/07/2017, 09/11/2019, 09/03/2020, 10/19/2021, 10/26/2022   Moderna Sars-Covid-2 Vaccination 01/18/2019, 02/15/2019, 11/19/2019, 10/17/2020, 06/01/2021   PNEUMOCOCCAL CONJUGATE-20 12/13/2021   Pneumococcal Conjugate-13 09/19/2013   Pneumococcal Polysaccharide-23 09/26/2003, 06/28/2016   Td 08/04/1998   Tdap 06/04/2010, 08/15/2021   Tetanus 08/04/1998   Unspecified SARS-COV-2 Vaccination 06/01/2021, 09/30/2022, 04/14/2023   Zoster Recombinant(Shingrix) 06/29/2022   Zoster, Live 01/16/2004   Pertinent  Health Maintenance Due  Topic Date Due   OPHTHALMOLOGY EXAM  06/01/2023   INFLUENZA VACCINE  08/04/2023   HEMOGLOBIN A1C  11/08/2023   FOOT EXAM  02/08/2024      12/05/2021   10:38 PM 12/06/2021    7:05 AM 12/06/2021    7:40 PM 12/07/2021    8:45 AM 06/28/2022    2:03 PM  Fall Risk  Falls in the past year?     1  Was there an injury with Fall?     1  Fall Risk Category Calculator     3  (RETIRED) Patient Fall Risk Level Moderate fall risk  High fall risk  High fall risk  High fall risk    Patient at Risk for Falls Due to     History of fall(s);Impaired balance/gait;Impaired mobility;Impaired vision     Data saved with a previous flowsheet row definition   Functional Status Survey:    Vitals:   06/16/23 0832  BP: 104/70  Pulse: 88  Weight: 163 lb 6.4 oz (74.1 kg)  Height: 5' 10 (1.778 m)   Body mass index is 23.45 kg/m. Physical Exam Constitutional:      Comments: Thin, frail  Cardiovascular:     Rate and Rhythm:  Rhythm irregular.     Pulses: Normal pulses.  Abdominal:     General: Abdomen is flat.  Palpations: Abdomen is soft.  Neurological:     General: No focal deficit present.     Mental Status: He is alert. Mental status is at baseline.     Labs reviewed: Recent Labs    11/07/22 0000 05/08/23 0000 05/18/23 0000  NA 138 138 139  K 4.0 4.2 4.1  CL 104 105 105  CO2 29* 30* 27*  BUN 38* 26* 30*  CREATININE 0.9 0.8 0.7  CALCIUM   --  9.9 10.0   Recent Labs    11/07/22 0000 05/08/23 0000  AST 13* 12*  ALT 13 9*  ALKPHOS 63 64  ALBUMIN 3.4* 3.2*   Recent Labs    05/08/23 0000 05/15/23 0000 05/18/23 0000 05/23/23 1154 06/05/23 0000  WBC 4.1   < > 4.3 5.1 6.0  NEUTROABS 1,915.00  --   --   --   --   HGB 6.9*   < > 6.9* 7.7* 9.4*  HCT 24*   < > 23* 25.9* 31*  MCV  --   --   --  86.3  --   PLT 98*   < > 147* 177 120*   < > = values in this interval not displayed.   Lab Results  Component Value Date   TSH 1.16 05/08/2023   Lab Results  Component Value Date   HGBA1C 6.9 05/08/2023   Lab Results  Component Value Date   CHOL 95 11/07/2022   HDL 59 11/07/2022   LDLCALC 27 11/07/2022   TRIG 31 (A) 11/07/2022   CHOLHDL 2 02/25/2021    Significant Diagnostic Results in last 30 days:  CUP PACEART REMOTE DEVICE CHECK Result Date: 06/13/2023 PPM Scheduled remote reviewed. Normal device function.  Presenting rhythm:  AS/VP with PAC's Next remote 91 days. LA, CVRS   Assessment/Plan Aortic atherosclerosis (HCC)  Atrial fibrillation, chronic (HCC)  Chronic diastolic heart failure (HCC)  Chronic venous insufficiency  Essential hypertension  Type 2 diabetes, controlled, with neuropathy (HCC)  Thrombocytopenia (HCC)  Iron deficiency anemia, unspecified iron deficiency anemia type  Presence of heart assist device [facility])  BPH with obstruction/lower urinary tract symptoms  Onychomycosis  Gastroesophageal reflux disease without esophagitis  History of CVA  (cerebrovascular accident) Patient present in facility for long term care. Initially admitted after fall and fracture of the elbow which occurred 11/10/2021. Patient has had stabilization of weight and food intake since that time. Mood is stable at this time and dose reduction or discontinuation could negatively impact patient and cause relapse of symptoms. For BPH takes flomax  and proscar . History of atrial fibrillation for which he takes eliquis . Continues to have monitoring of his anemia with hematology. Patient does not desire rehospitalization if it can be avoided at all costs. Most recent A1c at goal 6.9 continue metformin  as prescribed. Patient has had low blood pressures an BP medications were discontinued. Likely secondary to severe anemia. Will continue midodrine at this time to aid in maintaining a normal blood pressure. Follows with podiatry for toenail management. Denies increase in symptoms of GERD. MCI without acute concern for decline. Continue supportive care.  Family/ staff Communication: nursing  Labs/tests ordered:  CBCs

## 2023-06-19 DIAGNOSIS — D649 Anemia, unspecified: Secondary | ICD-10-CM | POA: Diagnosis not present

## 2023-06-20 ENCOUNTER — Other Ambulatory Visit

## 2023-06-20 ENCOUNTER — Encounter: Payer: Self-pay | Admitting: Oncology

## 2023-06-20 DIAGNOSIS — E119 Type 2 diabetes mellitus without complications: Secondary | ICD-10-CM | POA: Diagnosis not present

## 2023-06-20 DIAGNOSIS — Z961 Presence of intraocular lens: Secondary | ICD-10-CM | POA: Diagnosis not present

## 2023-06-20 DIAGNOSIS — H18423 Band keratopathy, bilateral: Secondary | ICD-10-CM | POA: Diagnosis not present

## 2023-06-20 LAB — HM DIABETES EYE EXAM

## 2023-06-21 ENCOUNTER — Ambulatory Visit: Payer: Self-pay | Admitting: Student

## 2023-06-21 DIAGNOSIS — E1159 Type 2 diabetes mellitus with other circulatory complications: Secondary | ICD-10-CM | POA: Diagnosis not present

## 2023-06-21 DIAGNOSIS — B351 Tinea unguium: Secondary | ICD-10-CM | POA: Diagnosis not present

## 2023-07-03 DIAGNOSIS — D649 Anemia, unspecified: Secondary | ICD-10-CM | POA: Diagnosis not present

## 2023-07-04 ENCOUNTER — Other Ambulatory Visit

## 2023-07-05 ENCOUNTER — Encounter: Payer: Self-pay | Admitting: Student

## 2023-07-10 ENCOUNTER — Other Ambulatory Visit: Payer: Self-pay

## 2023-07-10 DIAGNOSIS — D649 Anemia, unspecified: Secondary | ICD-10-CM | POA: Diagnosis not present

## 2023-07-10 NOTE — Progress Notes (Signed)
 error

## 2023-07-11 ENCOUNTER — Inpatient Hospital Stay: Attending: Oncology

## 2023-07-11 ENCOUNTER — Telehealth: Payer: Self-pay | Admitting: *Deleted

## 2023-07-11 ENCOUNTER — Inpatient Hospital Stay (HOSPITAL_BASED_OUTPATIENT_CLINIC_OR_DEPARTMENT_OTHER): Admitting: Oncology

## 2023-07-11 ENCOUNTER — Ambulatory Visit: Payer: Self-pay | Admitting: Oncology

## 2023-07-11 ENCOUNTER — Encounter: Payer: Self-pay | Admitting: Oncology

## 2023-07-11 ENCOUNTER — Encounter: Payer: Self-pay | Admitting: Student

## 2023-07-11 VITALS — BP 102/65 | HR 62 | Temp 98.3°F | Resp 20 | Wt 170.5 lb

## 2023-07-11 DIAGNOSIS — D696 Thrombocytopenia, unspecified: Secondary | ICD-10-CM | POA: Diagnosis not present

## 2023-07-11 DIAGNOSIS — Z87891 Personal history of nicotine dependence: Secondary | ICD-10-CM | POA: Insufficient documentation

## 2023-07-11 DIAGNOSIS — Z800 Family history of malignant neoplasm of digestive organs: Secondary | ICD-10-CM | POA: Diagnosis not present

## 2023-07-11 DIAGNOSIS — Z79899 Other long term (current) drug therapy: Secondary | ICD-10-CM | POA: Insufficient documentation

## 2023-07-11 DIAGNOSIS — D509 Iron deficiency anemia, unspecified: Secondary | ICD-10-CM | POA: Insufficient documentation

## 2023-07-11 DIAGNOSIS — Z993 Dependence on wheelchair: Secondary | ICD-10-CM | POA: Diagnosis not present

## 2023-07-11 LAB — IRON AND TIBC
Iron: 83 ug/dL (ref 45–182)
Saturation Ratios: 29 % (ref 17.9–39.5)
TIBC: 291 ug/dL (ref 250–450)
UIBC: 208 ug/dL

## 2023-07-11 LAB — CBC (CANCER CENTER ONLY)
HCT: 35.7 % — ABNORMAL LOW (ref 39.0–52.0)
Hemoglobin: 11 g/dL — ABNORMAL LOW (ref 13.0–17.0)
MCH: 28.8 pg (ref 26.0–34.0)
MCHC: 30.8 g/dL (ref 30.0–36.0)
MCV: 93.5 fL (ref 80.0–100.0)
Platelet Count: 125 K/uL — ABNORMAL LOW (ref 150–400)
RBC: 3.82 MIL/uL — ABNORMAL LOW (ref 4.22–5.81)
RDW: 18.3 % — ABNORMAL HIGH (ref 11.5–15.5)
WBC Count: 5.5 K/uL (ref 4.0–10.5)
nRBC: 0 % (ref 0.0–0.2)

## 2023-07-11 LAB — FOLATE: Folate: 26 ng/mL (ref >5.9)

## 2023-07-11 LAB — FERRITIN: Ferritin: 31 ng/mL (ref 24–336)

## 2023-07-11 NOTE — Progress Notes (Signed)
 Hematology/Oncology Consult note [facility]  [phone] [phone]  Patient Care Team: Abdul Fine, MD as PCP - General (Family Medicine) Perla Evalene PARAS, MD as PCP - Cardiology (Cardiology) Perla Evalene PARAS, MD as Consulting Physician (Cardiology) Melanee Annah BROCKS, MD as Consulting Physician (Hematology and Oncology) [facility], [facility])   Name of the patient: Michael Colon  [id number]  12-01-1930   Date of visit: 07/11/23  Diagnosis-anemia likely secondary to chronic disease along with component of iron deficiency  Chief complaint/ Reason for visit-routine follow-up of anemia  Heme/Onc history: patient is a 89 year old male with a past medical history significant for hypertension hyperlipidemia, A-fib, type 2 diabetes, GERD among other medical problems.  He has been referred for anemia.  CBC from 05/18/2023 showed white count of 4.3, H&H of 6.9/23 with a platelet count of 147.Patient's baseline hemoglobin has been around 10-11 since December 2023.  Prior to that he was normal at 14.  Platelet counts have been fluctuating between 100-140 since 2021 and that has not changed recently.  I do not have his MCV for his labs.  B12 levels normal at 768.  Ferritin and iron studies showed an iron saturation of 4% and ferritin level of 8.  TSH normal at 1.16   Patient received 2 doses of Feraheme in May 2025.  Patient has not undergone GI evaluation due to his age. Last endoscopy was in May 2018 by Dr. Jinny which showed normal esophagus stomach and duodenum.  Last colonoscopy was back in 2006.  Interval history-patient tolerated Feraheme well without any significant side effects.  Denies any blood loss in his stool.  Denies any dark melanotic stools.  Energy levels have improved with IV iron.  ECOG PS- 2 Pain scale- 0   Review of systems- Review of Systems  Constitutional:  Negative for chills, fever, malaise/fatigue and weight  loss.  HENT:  Negative for congestion, ear discharge and nosebleeds.   Eyes:  Negative for blurred vision.  Respiratory:  Negative for cough, hemoptysis, sputum production, shortness of breath and wheezing.   Cardiovascular:  Negative for chest pain, palpitations, orthopnea and claudication.  Gastrointestinal:  Negative for abdominal pain, blood in stool, constipation, diarrhea, heartburn, melena, nausea and vomiting.  Genitourinary:  Negative for dysuria, flank pain, frequency, hematuria and urgency.  Musculoskeletal:  Negative for back pain, joint pain and myalgias.  Skin:  Negative for rash.  Neurological:  Negative for dizziness, tingling, focal weakness, seizures, weakness and headaches.  Endo/Heme/Allergies:  Does not bruise/bleed easily.  Psychiatric/Behavioral:  Negative for depression and suicidal ideas. The patient does not have insomnia.       Allergies  Allergen Reactions   Lovenox [Enoxaparin] Hives   Proscar  [Finasteride ] Other (See Comments)    Upper body and arm weakness   Latex Rash    Has trouble with BANDAIDS that have been left on for more than 24 hours. Prefers paper tape.   Nickel Rash    Localized rash   Percocet [Oxycodone-Acetaminophen ] Rash   Tape Rash and Other (See Comments)    Sensitivity      Past Medical History:  Diagnosis Date   Asthma    Atrial flutter (HCC) 2007   Band keratopathy    Benign prostatic hypertrophy    Chronic ear infection    Chronic kidney disease    Chronic prostatitis    Dental bridge present    permanent - upper   Diabetes mellitus    Elbow  stiffness, left    s/p fracture many yrs ago.  arm does not straighten   Encounter for anticoagulation discussion and counseling 07/21/2010   Fall 12/04/2021   GERD (gastroesophageal reflux disease)    laryngeal involvement   Hyperlipidemia    Hypertension    Obstructive sleep apnea    CPAP-9   Orchitis of right testicle 11/30/2020   Osteoarthrosis, localized, primary, knee     post-traumatic   Prostatitis    Sinus drainage 03/11/2022   Skin cancer    Stroke [facility])    TIA   Tachy-brady syndrome (HCC)    UTI (urinary tract infection)      Past Surgical History:  Procedure Laterality Date   APPENDECTOMY     BELPHAROPTOSIS REPAIR     Dr [name]---didn't resolve weepy eye and eyelid drooping   CARDIOVERSION  04/13/2010   CATARACT EXTRACTION, BILATERAL  2009   Chest pain  8/12   Stress test benign   ESOPHAGEAL DILATION  05/26/2016   Procedure: ESOPHAGEAL DILATION;  Surgeon: Jinny Carmine, MD;  Location: [facility];  Service: Endoscopy;;   ESOPHAGOGASTRODUODENOSCOPY (EGD) WITH PROPOFOL  N/A 05/26/2016   Procedure: ESOPHAGOGASTRODUODENOSCOPY (EGD) WITH PROPOFOL ;  Surgeon: Jinny Carmine, MD;  Location: [facility];  Service: Endoscopy;  Laterality: N/A;  Diabetic - oral meds sleep apnea   EYE SURGERY     FRACTURE SURGERY Left    elbow   KNEE SURGERY  1998   plate after fracture, then removed for infection   left elbow surgery     MASTOIDECTOMY  8/08   Dr Rena   PACEMAKER IMPLANT N/A 09/02/2021   Procedure: PACEMAKER IMPLANT;  Surgeon: Cindie Ole DASEN, MD;  Location: [facility];  Service: Cardiovascular;  Laterality: N/A;   RHINOPLASTY  5/10   and septoplasty   SUBACROMIAL DECOMPRESSION Right 2005   Arthroscopic (for rotator cuff and biceps tendon ruptures)   TEAR DUCT PROBING  11/13   Dr Ashley   TOTAL KNEE ARTHROPLASTY Left 09/01/2014   Procedure: TOTAL KNEE ARTHROPLASTY;  Surgeon: Lynwood SHAUNNA Hue, MD;  Location: [facility] OR[facility];  Service: Orthopedics;  Laterality: Left;   TRIGGER FINGER RELEASE Left 02/25/2015   Procedure: LEFT LONG TRIGGER RELEASE;  Surgeon: Lynwood SHAUNNA Hue, MD;  Location: [facility] OR[facility];  Service: Orthopedics;  Laterality: Left;   VENOUS ABLATION      Social History   Socioeconomic History   Marital status: Widowed    Spouse name: Not on file   Number of children: 2   Years of education: Not on file   Highest  education level: Not on file  Occupational History   Occupation: Education officer, environmental    Comment: Retired--[employer]  Tobacco Use   Smoking status: Former    Current packs/day: 0.00    Types: Cigarettes    Start date: 01/03/1969    Quit date: 01/03/1969    Years since quitting: 54.5    Passive exposure: Never   Smokeless tobacco: Never  Vaping Use   Vaping status: Never Used  Substance and Sexual Activity   Alcohol  use: No    Alcohol /week: 1.0 standard drink of alcohol     Types: 1 Glasses of wine per week    Comment: rare wine. 1-2x/yr.   Drug use: No   Sexual activity: Not Currently  Other Topics Concern   Not on file  Social History Narrative   Has living will   Health care POA-- son Alm   Has DNR order from the past---form redone 06/25/10  Probably no feeding tube if cognitively unaware   Social Drivers of Health   Financial Resource Strain: Not on file  Food Insecurity: No Food Insecurity (05/23/2023)   Hunger Vital Sign    Worried About Running Out of Food in the Last Year: Never true    Ran Out of Food in the Last Year: Never true  Transportation Needs: No Transportation Needs (05/23/2023)   PRAPARE - Administrator, Civil Service (Medical): No    Lack of Transportation (Non-Medical): No  Physical Activity: Not on file  Stress: Not on file  Social Connections: Not on file  Intimate Partner Violence: Not At Risk (05/23/2023)   Humiliation, Afraid, Rape, and Kick questionnaire    Fear of Current or Ex-Partner: No    Emotionally Abused: No    Physically Abused: No    Sexually Abused: No    Family History  Problem Relation Age of Onset   Colon cancer Father    Diabetes Father    Hypertension Father    Other Mother        natural causes   Heart attack Neg Hx    Stroke Neg Hx      Current Outpatient Medications:    acetaminophen  (TYLENOL ) 500 MG tablet, Take 1,000 mg by mouth every 8 (eight) hours as needed., Disp: , Rfl:    apixaban  (ELIQUIS ) 2.5 MG TABS  tablet, Take 2.5 mg by mouth 2 (two) times daily., Disp: , Rfl:    APPLE CIDER VINEGAR PO, Take 1 capsule by mouth at bedtime., Disp: , Rfl:    atorvastatin  (LIPITOR ) 80 MG tablet, Take 1 tablet (80 mg total) by mouth daily., Disp: 90 tablet, Rfl: 3   Calcium  Polycarbophil (FIBER-CAPS PO), Take 1 capsule by mouth every morning. For stool bulking (Patient not taking: Reported on 06/16/2023), Disp: , Rfl:    chlorhexidine  (PERIDEX ) 0.12 % solution, , Disp: , Rfl:    clindamycin (CLEOCIN T) 1 % external solution, Apply topically 2 (two) times daily. (Patient not taking: Reported on 06/16/2023), Disp: , Rfl:    Cranberry 250 MG TABS, Take 1 tablet by mouth 2 (two) times daily., Disp: , Rfl:    Cyanocobalamin  (B-12 PO), Take 1,000 mg by mouth daily., Disp: , Rfl:    FIBER PO, Take 1 capsule by mouth daily., Disp: , Rfl:    finasteride  (PROSCAR ) 5 MG tablet, Take 1 tablet (5 mg total) by mouth daily., Disp: 90 tablet, Rfl: 3   guaiFENesin  (MUCINEX ) 600 MG 12 hr tablet, Take 600 mg by mouth every 8 (eight) hours as needed., Disp: , Rfl:    guaifenesin  (ROBITUSSIN) 100 MG/5ML syrup, Take 10 mLs by mouth every 4 (four) hours as needed for cough., Disp: , Rfl:    IRON, FERROUS GLUCONATE, PO, Take 65 mg by mouth daily., Disp: , Rfl:    lactose free nutrition (BOOST) LIQD, Take 237 mLs by mouth 3 (three) times daily between meals., Disp: , Rfl:    lidocaine  (LIDODERM ) 5 %, Place 1 patch onto the skin daily. Remove & Discard patch within 12 hours or as directed by MD, Disp: 30 patch, Rfl: 0   Magnesium  Oxide 400 MG CAPS, Take 1 capsule (400 mg total) by mouth daily., Disp: 90 capsule, Rfl: 3   metFORMIN  (GLUCOPHAGE ) 500 MG tablet, TAKE 1 TABLET BY MOUTH  TWICE DAILY WITH MEALS, Disp: 180 tablet, Rfl: 3   midodrine (PROAMATINE) 5 MG tablet, Take 5 mg by mouth 3 (three) times daily with  meals as needed., Disp: , Rfl:    mineral oil-hydrophilic petrolatum (AQUAPHOR) ointment, Apply 1 Application topically daily as  needed for dry skin. To left lower leg, Disp: , Rfl:    Polyethyl Glycol-Propyl Glycol (SYSTANE ULTRA OP), Place 1 drop into both eyes daily as needed (dry eyes)., Disp: , Rfl:    polyethylene glycol (MIRALAX  / GLYCOLAX ) 17 g packet, Take 17 g by mouth every 12 (twelve) hours as needed. Daily every Monday, Wednesday and Friday., Disp: , Rfl:    saccharomyces boulardii (FLORASTOR) 250 MG capsule, Take 250 mg by mouth daily., Disp: , Rfl:    sertraline (ZOLOFT) 50 MG tablet, Take 50 mg by mouth daily., Disp: , Rfl:    silver  sulfADIAZINE  (SILVADENE ) 1 % cream, Apply topically. (Patient not taking: Reported on 06/16/2023), Disp: , Rfl:    sodium chloride  (OCEAN) 0.65 % SOLN nasal spray, Place 2 sprays into both nostrils 2 (two) times daily as needed for congestion., Disp: , Rfl:    Sodium Fluoride (PREVIDENT 5000 BOOSTER PLUS) 1.1 % PSTE, Place 1 application  onto teeth at bedtime as needed., Disp: , Rfl:    tamsulosin  (FLOMAX ) 0.4 MG CAPS capsule, Take 2 capsules (0.8 mg total) by mouth at bedtime., Disp: 90 capsule, Rfl: 6   traMADol  (ULTRAM ) 50 MG tablet, Take by mouth. (Patient not taking: Reported on 06/16/2023), Disp: , Rfl:    Wheat Dextrin (BENEFIBER) POWD, Take by mouth.  Give 1 Tbsp by mouth one time a day on odd days for bowel regulation, Disp: , Rfl:   Physical exam: There were no vitals filed for this visit. Physical Exam Constitutional:      Comments: Elderly frail gentleman sitting in a wheelchair.  Appears in no acute distress  Cardiovascular:     Rate and Rhythm: Normal rate and regular rhythm.     Heart sounds: Normal heart sounds.  Pulmonary:     Effort: Pulmonary effort is normal.     Breath sounds: Normal breath sounds.  Abdominal:     General: Bowel sounds are normal.     Palpations: Abdomen is soft.  Skin:    General: Skin is warm and dry.  Neurological:     Mental Status: He is alert and oriented to person, place, and time.      I have personally reviewed labs  listed below:    Latest Ref Rng & Units 05/18/2023   12:00 AM  CMP  BUN 4 - 21 30      Creatinine 0.6 - 1.3 0.7      Sodium 137 - 147 139      Potassium 3.5 - 5.1 mEq/L 4.1      Chloride 99 - 108 105      CO2 13 - 22 27      Calcium  8.7 - 10.7 10.0         This result is from an external source.      Latest Ref Rng & Units 06/05/2023   12:00 AM  CBC  WBC  6.0      Hemoglobin 13.5 - 17.5 9.4      Hematocrit 41 - 53 31      Platelets 150 - 400 K/uL 120         This result is from an external source.   I have personally reviewed Radiology images listed below: No images are attached to the encounter.  CUP PACEART REMOTE DEVICE CHECK Result Date: 06/13/2023 PPM Scheduled remote reviewed. Normal device  function.  Presenting rhythm:  AS/VP with PAC's Next remote 91 days. LA, CVRS    Assessment and plan- Patient is a 89 y.o. male here for routine follow-up of iron deficiency anemia  Patient's hemoglobin was down to 6.9 in May 2025.  His baseline runs around 11.  Patient Received 2 doses of Feraheme and presently his hemoglobin is up to 11 with an MCV of 93.5.  Iron studies are presently pending.  If he needs IV iron we will give him a call.  I will repeat CBC ferritin and iron studies in 2 4 and 6 months and I will see him back in 6 months.  Patient does have chronic baseline thrombocytopenia with a platelet count that fluctuates between 120s to 140s.  Continue to monitor   Visit Diagnosis 1. Iron deficiency anemia, unspecified iron deficiency anemia type      Dr. Annah Skene, MD, MPH [facility] at [facility] [phone] 07/11/2023 11:27 AM

## 2023-07-11 NOTE — Addendum Note (Signed)
 Addended by: MELANEE PIGGS C on: 07/11/2023 01:10 PM   Modules accepted: Orders

## 2023-07-11 NOTE — Telephone Encounter (Signed)
 RN called and spoke to [employer] at nursing facility and given order for lab draws at 2 months and 4 months for CBC, ferritin and Iron TIBC.  Nurse verbalized understanding and stated she would fax results when obtained.

## 2023-07-14 ENCOUNTER — Inpatient Hospital Stay

## 2023-07-23 ENCOUNTER — Encounter: Payer: Self-pay | Admitting: Student

## 2023-07-23 DIAGNOSIS — Z8673 Personal history of transient ischemic attack (TIA), and cerebral infarction without residual deficits: Secondary | ICD-10-CM | POA: Insufficient documentation

## 2023-07-23 HISTORY — DX: Personal history of transient ischemic attack (TIA), and cerebral infarction without residual deficits: Z86.73

## 2023-07-26 NOTE — Addendum Note (Signed)
 Addended by: TAWNI DRILLING D on: 07/26/2023 05:25 PM   Modules accepted: Orders

## 2023-07-26 NOTE — Progress Notes (Signed)
 Remote pacemaker transmission.

## 2023-08-03 ENCOUNTER — Encounter: Payer: Self-pay | Admitting: Nurse Practitioner

## 2023-08-03 ENCOUNTER — Non-Acute Institutional Stay (SKILLED_NURSING_FACILITY): Admitting: Nurse Practitioner

## 2023-08-03 DIAGNOSIS — I700 Atherosclerosis of aorta: Secondary | ICD-10-CM | POA: Diagnosis not present

## 2023-08-03 DIAGNOSIS — E1140 Type 2 diabetes mellitus with diabetic neuropathy, unspecified: Secondary | ICD-10-CM

## 2023-08-03 DIAGNOSIS — I10 Essential (primary) hypertension: Secondary | ICD-10-CM

## 2023-08-03 DIAGNOSIS — I5032 Chronic diastolic (congestive) heart failure: Secondary | ICD-10-CM | POA: Diagnosis not present

## 2023-08-03 DIAGNOSIS — D509 Iron deficiency anemia, unspecified: Secondary | ICD-10-CM

## 2023-08-03 DIAGNOSIS — N138 Other obstructive and reflux uropathy: Secondary | ICD-10-CM | POA: Diagnosis not present

## 2023-08-03 DIAGNOSIS — I872 Venous insufficiency (chronic) (peripheral): Secondary | ICD-10-CM | POA: Diagnosis not present

## 2023-08-03 DIAGNOSIS — I482 Chronic atrial fibrillation, unspecified: Secondary | ICD-10-CM | POA: Diagnosis not present

## 2023-08-03 DIAGNOSIS — L309 Dermatitis, unspecified: Secondary | ICD-10-CM

## 2023-08-03 DIAGNOSIS — N401 Enlarged prostate with lower urinary tract symptoms: Secondary | ICD-10-CM

## 2023-08-03 NOTE — Assessment & Plan Note (Signed)
 Rate controlled, continues on eliquis for anticoagulation

## 2023-08-03 NOTE — Assessment & Plan Note (Signed)
 Euvolemic.

## 2023-08-03 NOTE — Progress Notes (Signed)
 Location:  Other [facility]) Nursing Home Room Number: 501-A Place of Service:  SNF [phone])  Abdul Fine, MD  Patient Care Team: Abdul Fine, MD as PCP - General (Family Medicine) Perla Evalene PARAS, MD as PCP - Cardiology (Cardiology) Perla Evalene PARAS, MD as Consulting Physician (Cardiology) Melanee Annah BROCKS, MD as Consulting Physician (Hematology and Oncology) [place], [facility])  Extended Emergency Contact Information Primary Emergency Contact: Ezana, Hubbert [facility]) Address: [address]          [place], [place] [address] [place] Phone: [phone] Relation: Son  Goals of care: Advanced Directive information    08/03/2023    1:49 PM  Advanced Directives  Does Patient Have a Medical Advance Directive? Yes  Type of Estate agent of [place];Living will;Out of facility DNR (pink MOST or yellow form)  Does patient want to make changes to medical advance directive? No - Patient declined  Copy of Healthcare Power of Attorney in Chart? Yes - validated most recent copy scanned in chart (See row information)     Chief Complaint  Patient presents with   Follow-up    Routine    HPI:  Pt is a 89 y.o. male seen today for medical management of chronic disease.  Pt reports areas on both arms that are red and appeared over night denies itching or pain.  Nursing has noted he picks at his skin but he denies picking or scratching at area.   He reports some constipation but staff reports he goes regularly even if this is not every day.   He denies pains.   Reports LE edema which is chronic and unchanged, no shortness of breath or chest pains.   States he eats minimally but eats- weight has been stable and had some positive weight gain  Past Medical History:  Diagnosis Date   Asthma    Atrial flutter (HCC) 2007   Band keratopathy    Benign prostatic hypertrophy    Chronic ear infection    Chronic kidney  disease    Chronic prostatitis    Dental bridge present    permanent - upper   Diabetes mellitus    Elbow stiffness, left    s/p fracture many yrs ago.  arm does not straighten   Encounter for anticoagulation discussion and counseling 07/21/2010   Fall 12/04/2021   GERD (gastroesophageal reflux disease)    laryngeal involvement   Hyperlipidemia    Hypertension    Obstructive sleep apnea    CPAP-9   Orchitis of right testicle 11/30/2020   Osteoarthrosis, localized, primary, knee    post-traumatic   Prostatitis    Sinus drainage 03/11/2022   Skin cancer    Stroke [facility])    TIA   Tachy-brady syndrome (HCC)    UTI (urinary tract infection)    Past Surgical History:  Procedure Laterality Date   APPENDECTOMY     BELPHAROPTOSIS REPAIR     Dr [name]---didn't resolve weepy eye and eyelid drooping   CARDIOVERSION  04/13/2010   CATARACT EXTRACTION, BILATERAL  2009   Chest pain  8/12   Stress test benign   ESOPHAGEAL DILATION  05/26/2016   Procedure: ESOPHAGEAL DILATION;  Surgeon: Jinny Carmine, MD;  Location: [facility];  Service: Endoscopy;;   ESOPHAGOGASTRODUODENOSCOPY (EGD) WITH PROPOFOL  N/A 05/26/2016   Procedure: ESOPHAGOGASTRODUODENOSCOPY (EGD) WITH PROPOFOL ;  Surgeon: Jinny Carmine, MD;  Location: [facility];  Service: Endoscopy;  Laterality: N/A;  Diabetic - oral meds sleep  apnea   EYE SURGERY     FRACTURE SURGERY Left    elbow   KNEE SURGERY  1998   plate after fracture, then removed for infection   left elbow surgery     MASTOIDECTOMY  8/08   Dr Rena   PACEMAKER IMPLANT N/A 09/02/2021   Procedure: PACEMAKER IMPLANT;  Surgeon: Cindie Ole DASEN, MD;  Location: [facility] CV [facility];  Service: Cardiovascular;  Laterality: N/A;   RHINOPLASTY  5/10   and septoplasty   SUBACROMIAL DECOMPRESSION Right 2005   Arthroscopic (for rotator cuff and biceps tendon ruptures)   TEAR DUCT PROBING  11/13   Dr Ashley   TOTAL KNEE ARTHROPLASTY Left 09/01/2014    Procedure: TOTAL KNEE ARTHROPLASTY;  Surgeon: Lynwood SHAUNNA Hue, MD;  Location: [facility] OR[facility];  Service: Orthopedics;  Laterality: Left;   TRIGGER FINGER RELEASE Left 02/25/2015   Procedure: LEFT LONG TRIGGER RELEASE;  Surgeon: Lynwood SHAUNNA Hue, MD;  Location: [facility];  Service: Orthopedics;  Laterality: Left;   VENOUS ABLATION      Allergies  Allergen Reactions   Lovenox [Enoxaparin] Hives   Proscar  [Finasteride ] Other (See Comments)    Upper body and arm weakness   Latex Rash    Has trouble with BANDAIDS that have been left on for more than 24 hours. Prefers paper tape.   Nickel Rash    Localized rash   Percocet [Oxycodone-Acetaminophen ] Rash   Tape Rash and Other (See Comments)    Sensitivity     Outpatient Encounter Medications as of 08/03/2023  Medication Sig   acetaminophen  (TYLENOL ) 500 MG tablet Take 1,000 mg by mouth every 8 (eight) hours as needed.   amoxicillin-clavulanate (AUGMENTIN) 875-125 MG tablet Take 1 tablet by mouth 2 (two) times daily.   apixaban  (ELIQUIS ) 2.5 MG TABS tablet Take 2.5 mg by mouth 2 (two) times daily.   APPLE CIDER VINEGAR PO Take 1 capsule by mouth at bedtime.   atorvastatin  (LIPITOR ) 80 MG tablet Take 1 tablet (80 mg total) by mouth daily.   Cranberry 250 MG TABS Take 1 tablet by mouth 2 (two) times daily.   Cyanocobalamin  (B-12 PO) Take 1,000 mg by mouth daily.   FIBER PO Take 1 capsule by mouth daily.   finasteride  (PROSCAR ) 5 MG tablet Take 1 tablet (5 mg total) by mouth daily.   guaiFENesin  (MUCINEX ) 600 MG 12 hr tablet Take 600 mg by mouth every 8 (eight) hours as needed.   guaifenesin  (ROBITUSSIN) 100 MG/5ML syrup Take 10 mLs by mouth every 4 (four) hours as needed for cough.   IRON, FERROUS GLUCONATE, PO Take 65 mg by mouth daily.   lactose free nutrition (BOOST) LIQD Take 237 mLs by mouth 3 (three) times daily between meals.   lidocaine  (LIDODERM ) 5 % Place 1 patch onto the skin daily. Remove & Discard patch within 12 hours or as directed by MD    Magnesium  Oxide 400 MG CAPS Take 1 capsule (400 mg total) by mouth daily.   metFORMIN  (GLUCOPHAGE ) 500 MG tablet TAKE 1 TABLET BY MOUTH  TWICE DAILY WITH MEALS   mineral oil-hydrophilic petrolatum (AQUAPHOR) ointment Apply 1 Application topically daily as needed for dry skin. To left lower leg   Polyethyl Glycol-Propyl Glycol (SYSTANE ULTRA OP) Place 1 drop into both eyes daily as needed (dry eyes).   polyethylene glycol (MIRALAX  / GLYCOLAX ) 17 g packet Take 17 g by mouth every 12 (twelve) hours as needed. Daily every Monday, Wednesday and Friday.   saccharomyces boulardii (  FLORASTOR) 250 MG capsule Take 250 mg by mouth daily.   sertraline (ZOLOFT) 50 MG tablet Take 50 mg by mouth daily.   sodium chloride  (OCEAN) 0.65 % SOLN nasal spray Place 2 sprays into both nostrils 2 (two) times daily as needed for congestion.   Sodium Fluoride (PREVIDENT 5000 BOOSTER PLUS) 1.1 % PSTE Place 1 application  onto teeth at bedtime as needed.   tamsulosin  (FLOMAX ) 0.4 MG CAPS capsule Take 2 capsules (0.8 mg total) by mouth at bedtime.   Wheat Dextrin (BENEFIBER) POWD Take by mouth.  Give 1 Tbsp by mouth one time a day on odd days for bowel regulation   midodrine (PROAMATINE) 5 MG tablet Take 5 mg by mouth 3 (three) times daily with meals as needed. (Patient not taking: Reported on 08/03/2023)   No facility-administered encounter medications on file as of 08/03/2023.    Review of Systems  Constitutional:  Negative for activity change, appetite change, fatigue and unexpected weight change.  HENT:  Negative for congestion and hearing loss.   Eyes: Negative.   Respiratory:  Negative for cough and shortness of breath.   Cardiovascular:  Negative for chest pain, palpitations and leg swelling.  Gastrointestinal:  Positive for constipation. Negative for abdominal pain and diarrhea.  Genitourinary:  Negative for difficulty urinating and dysuria.  Musculoskeletal:  Negative for arthralgias and myalgias.  Skin:  Negative  for color change and wound.       Red/dry patches noted to arms   Neurological:  Negative for dizziness and weakness.  Psychiatric/Behavioral:  Negative for agitation, behavioral problems and confusion.      Immunization History  Administered Date(s) Administered   Fluad Quad(high Dose 65+) 09/14/2018   H1N1 12/13/2007   Influenza Split 09/11/2010   Influenza Whole 09/12/2011   Influenza, High Dose Seasonal PF 09/20/2012, 09/07/2017   Influenza, Seasonal, Injecte, Preservative Fre 09/08/2007, 09/26/2008, 10/02/2014, 09/18/2015   Influenza,inj,Quad PF,6+ Mos 09/19/2013, 09/21/2015   Influenza-Unspecified 09/07/2017, 09/11/2019, 09/03/2020, 10/19/2021, 10/26/2022   Moderna Sars-Covid-2 Vaccination 01/18/2019, 02/15/2019, 11/19/2019, 10/17/2020, 06/01/2021   PNEUMOCOCCAL CONJUGATE-20 12/13/2021   Pneumococcal Conjugate-13 09/19/2013   Pneumococcal Polysaccharide-23 09/26/2003, 06/28/2016   Td 08/04/1998   Tdap 06/04/2010, 08/15/2021   Tetanus 08/04/1998   Unspecified SARS-COV-2 Vaccination 06/01/2021, 09/30/2022, 04/14/2023   Zoster Recombinant(Shingrix) 06/29/2022   Zoster, Live 01/16/2004   Pertinent  Health Maintenance Due  Topic Date Due   INFLUENZA VACCINE  08/04/2023   HEMOGLOBIN A1C  11/08/2023   FOOT EXAM  02/08/2024   OPHTHALMOLOGY EXAM  06/19/2024      12/05/2021   10:38 PM 12/06/2021    7:05 AM 12/06/2021    7:40 PM 12/07/2021    8:45 AM 06/28/2022    2:03 PM  Fall Risk  Falls in the past year?     1  Was there an injury with Fall?     1  Fall Risk Category Calculator     3  (RETIRED) Patient Fall Risk Level Moderate fall risk  High fall risk  High fall risk  High fall risk    Patient at Risk for Falls Due to     History of fall(s);Impaired balance/gait;Impaired mobility;Impaired vision     Data saved with a previous flowsheet row definition   Functional Status Survey:    Vitals:   08/03/23 1344  BP: 112/65  Pulse: 73  Resp: 16  Temp: (!) 97.5 F (36.4 C)   SpO2: 93%  Weight: 170 lb 6.4 oz (77.3 kg)  Height: 5' 10 (  1.778 m)   Body mass index is 24.45 kg/m. Physical Exam Constitutional:      General: He is not in acute distress.    Appearance: He is well-developed. He is not diaphoretic.  HENT:     Head: Normocephalic and atraumatic.     Right Ear: External ear normal.     Left Ear: External ear normal.     Mouth/Throat:     Pharynx: No oropharyngeal exudate.  Eyes:     Conjunctiva/sclera: Conjunctivae normal.     Pupils: Pupils are equal, round, and reactive to light.  Cardiovascular:     Rate and Rhythm: Normal rate and regular rhythm.     Heart sounds: Normal heart sounds.  Pulmonary:     Effort: Pulmonary effort is normal.     Breath sounds: Normal breath sounds.  Abdominal:     General: Bowel sounds are normal.     Palpations: Abdomen is soft.  Musculoskeletal:        General: No tenderness.     Cervical back: Normal range of motion and neck supple.     Right lower leg: No edema.     Left lower leg: No edema.  Skin:    General: Skin is warm and dry.     Findings: Bruising present.  Neurological:     Mental Status: He is alert. Mental status is at baseline.     Motor: Weakness present.     Gait: Gait abnormal.  Psychiatric:        Mood and Affect: Mood normal.     Labs reviewed: Recent Labs    11/07/22 0000 05/08/23 0000 05/18/23 0000  NA 138 138 139  K 4.0 4.2 4.1  CL 104 105 105  CO2 29* 30* 27*  BUN 38* 26* 30*  CREATININE 0.9 0.8 0.7  CALCIUM   --  9.9 10.0   Recent Labs    11/07/22 0000 05/08/23 0000  AST 13* 12*  ALT 13 9*  ALKPHOS 63 64  ALBUMIN 3.4* 3.2*   Recent Labs    05/08/23 0000 05/15/23 0000 05/23/23 1154 06/05/23 0000 07/11/23 1053  WBC 4.1   < > 5.1 6.0 5.5  NEUTROABS 1,915.00  --   --   --   --   HGB 6.9*   < > 7.7* 9.4* 11.0*  HCT 24*   < > 25.9* 31* 35.7*  MCV  --   --  86.3  --  93.5  PLT 98*   < > 177 120* 125*   < > = values in this interval not displayed.    Lab Results  Component Value Date   TSH 1.16 05/08/2023   Lab Results  Component Value Date   HGBA1C 6.9 05/08/2023   Lab Results  Component Value Date   CHOL 95 11/07/2022   HDL 59 11/07/2022   LDLCALC 27 11/07/2022   TRIG 31 (A) 11/07/2022   CHOLHDL 2 02/25/2021    Significant Diagnostic Results in last 30 days:  No results found.  Assessment/Plan Atrial fibrillation, chronic (HCC) Rate controlled, continues on eliquis  for anticoagulation   BPH with obstruction/lower urinary tract symptoms Stable on flomax  and proscar .   Chronic diastolic heart failure (HCC) Euvolemic.   Chronic venous insufficiency -controlled at this time. encouraged to elevate legs above level of heart as tolerates, low sodium diet, compression hose as tolerates (on in am, off in pm)  Essential hypertension Blood pressure well controlled, goal bp <140/90 Continue current medications and dietary modifications  follow metabolic panel  Iron deficiency anemia He has been followed by hematology, recent iron infusion with hgb trending up at this time.   Type 2 diabetes, controlled, with neuropathy (HCC) A1c controlled on last lab, continues on metformin . Will follow a1c  Aortic atherosclerosis (HCC) Continues on lipitor  and eliquis   Eczema Noted to arms, has OTC cream he would like to use PRN. Nursing to monitor for increase in redness/infection     Saranda Legrande K. Caro BODILY [facility] [phone]

## 2023-08-03 NOTE — Assessment & Plan Note (Signed)
 He has been followed by hematology, recent iron infusion with hgb trending up at this time.

## 2023-08-03 NOTE — Assessment & Plan Note (Signed)
-  controlled at this time. encouraged to elevate legs above level of heart as tolerates, low sodium diet, compression hose as tolerates (on in am, off in pm)

## 2023-08-03 NOTE — Assessment & Plan Note (Signed)
 Stable on flomax and proscar.

## 2023-08-03 NOTE — Assessment & Plan Note (Signed)
 A1c controlled on last lab, continues on metformin. Will follow a1c

## 2023-08-03 NOTE — Assessment & Plan Note (Signed)
 Continues on lipitor and eliquis

## 2023-08-03 NOTE — Assessment & Plan Note (Signed)
 Blood pressure well controlled, goal bp <140/90 Continue current medications and dietary modifications follow metabolic panel

## 2023-08-21 ENCOUNTER — Non-Acute Institutional Stay (SKILLED_NURSING_FACILITY): Admitting: Student

## 2023-08-21 ENCOUNTER — Encounter: Payer: Self-pay | Admitting: Student

## 2023-08-21 DIAGNOSIS — R197 Diarrhea, unspecified: Secondary | ICD-10-CM | POA: Diagnosis not present

## 2023-08-21 DIAGNOSIS — D649 Anemia, unspecified: Secondary | ICD-10-CM

## 2023-08-21 DIAGNOSIS — R109 Unspecified abdominal pain: Secondary | ICD-10-CM | POA: Diagnosis not present

## 2023-08-21 NOTE — Progress Notes (Signed)
 Location:  Other Nursing Home Room Number: 501A Place of Service:  SNF (31) Provider:  Abdul Abdul, Richerd, MD  Patient Care Team: Abdul Richerd, MD as PCP - General (Family Medicine) Perla Evalene PARAS, MD as PCP - Cardiology (Cardiology) Perla Evalene PARAS, MD as Consulting Physician (Cardiology) Melanee Annah BROCKS, MD as Consulting Physician (Hematology and Oncology) [facility], [facility])  Extended Emergency Contact Information Primary Emergency Contact: Rocky Alm Cart [facility]) Address: [address]          [place], [place] [address] [place] Phone: [phone] Relation: Son  Code Status:  DNR Goals of care: Advanced Directive information    08/03/2023    1:49 PM  Advanced Directives  Does Patient Have a Medical Advance Directive? Yes  Type of Estate agent of [place];Living will;Out of facility DNR (pink MOST or yellow form)  Does patient want to make changes to medical advance directive? No - Patient declined  Copy of Healthcare Power of Attorney in Chart? Yes - validated most recent copy scanned in chart (See row information)     Chief Complaint  Patient presents with   Diarrhea    HPI:  Pt is a 89 y.o. male seen today for an acute visit  Discussed the use of AI scribe software for clinical note transcription with the patient, who gave verbal consent to proceed.  History of Present Illness   History of Present Illness The patient presents with persistent diarrhea following manual stool removal.  He has been experiencing persistent diarrhea following a manual stool removal performed by his caregiver. The stool was removed because it was obstructing his ability to be comfortable in bed. Initially, he appeared more comfortable and was able to sleep, but subsequently developed continuous diarrhea.  He has been groaning and moaning. Despite the diarrhea, he is not constipated. The caregiver emphasized  that the bowel sounds are not consistent with a typical gastrointestinal infection, describing them as 'not like a bug'.  There is a history of anemia, which is relevant to his current medical evaluation.  Past Medical History - Anemia    Physical Exam ABDOMEN: Bowel sounds present throughout.  Assessment and Plan   Past Medical History:  Diagnosis Date   Asthma    Atrial flutter (HCC) 2007   Band keratopathy    Benign prostatic hypertrophy    Chronic ear infection    Chronic kidney disease    Chronic prostatitis    Dental bridge present    permanent - upper   Diabetes mellitus    Elbow stiffness, left    s/p fracture many yrs ago.  arm does not straighten   Encounter for anticoagulation discussion and counseling 07/21/2010   Fall 12/04/2021   GERD (gastroesophageal reflux disease)    laryngeal involvement   Hyperlipidemia    Hypertension    Obstructive sleep apnea    CPAP-9   Orchitis of right testicle 11/30/2020   Osteoarthrosis, localized, primary, knee    post-traumatic   Prostatitis    Sinus drainage 03/11/2022   Skin cancer    Stroke [facility])    TIA   Tachy-brady syndrome (HCC)    UTI (urinary tract infection)    Past Surgical History:  Procedure Laterality Date   APPENDECTOMY     BELPHAROPTOSIS REPAIR     Dr [name]---didn't resolve weepy eye and eyelid drooping   CARDIOVERSION  04/13/2010   CATARACT EXTRACTION, BILATERAL  2009   Chest pain  8/12  Stress test benign   ESOPHAGEAL DILATION  05/26/2016   Procedure: ESOPHAGEAL DILATION;  Surgeon: Jinny Carmine, MD;  Location: [facility];  Service: Endoscopy;;   ESOPHAGOGASTRODUODENOSCOPY (EGD) WITH PROPOFOL  N/A 05/26/2016   Procedure: ESOPHAGOGASTRODUODENOSCOPY (EGD) WITH PROPOFOL ;  Surgeon: Jinny Carmine, MD;  Location: [facility];  Service: Endoscopy;  Laterality: N/A;  Diabetic - oral meds sleep apnea   EYE SURGERY     FRACTURE SURGERY Left    elbow   KNEE SURGERY  1998   plate after  fracture, then removed for infection   left elbow surgery     MASTOIDECTOMY  8/08   Dr Rena   PACEMAKER IMPLANT N/A 09/02/2021   Procedure: PACEMAKER IMPLANT;  Surgeon: Cindie Ole DASEN, MD;  Location: [facility];  Service: Cardiovascular;  Laterality: N/A;   RHINOPLASTY  5/10   and septoplasty   SUBACROMIAL DECOMPRESSION Right 2005   Arthroscopic (for rotator cuff and biceps tendon ruptures)   TEAR DUCT PROBING  11/13   Dr Ashley   TOTAL KNEE ARTHROPLASTY Left 09/01/2014   Procedure: TOTAL KNEE ARTHROPLASTY;  Surgeon: Lynwood SHAUNNA Hue, MD;  Location: [facility];  Service: Orthopedics;  Laterality: Left;   TRIGGER FINGER RELEASE Left 02/25/2015   Procedure: LEFT LONG TRIGGER RELEASE;  Surgeon: Lynwood SHAUNNA Hue, MD;  Location: [facility];  Service: Orthopedics;  Laterality: Left;   VENOUS ABLATION      Allergies  Allergen Reactions   Lovenox [Enoxaparin] Hives   Proscar  [Finasteride ] Other (See Comments)    Upper body and arm weakness   Latex Rash    Has trouble with BANDAIDS that have been left on for more than 24 hours. Prefers paper tape.   Nickel Rash    Localized rash   Percocet [Oxycodone-Acetaminophen ] Rash   Tape Rash and Other (See Comments)    Sensitivity     Outpatient Encounter Medications as of 08/21/2023  Medication Sig   acetaminophen  (TYLENOL ) 500 MG tablet Take 1,000 mg by mouth every 8 (eight) hours as needed.   amoxicillin-clavulanate (AUGMENTIN) 875-125 MG tablet Take 1 tablet by mouth 2 (two) times daily.   apixaban  (ELIQUIS ) 2.5 MG TABS tablet Take 2.5 mg by mouth 2 (two) times daily.   APPLE CIDER VINEGAR PO Take 1 capsule by mouth at bedtime.   atorvastatin  (LIPITOR ) 80 MG tablet Take 1 tablet (80 mg total) by mouth daily.   Cranberry 250 MG TABS Take 1 tablet by mouth 2 (two) times daily.   Cyanocobalamin  (B-12 PO) Take 1,000 mg by mouth daily.   FIBER PO Take 1 capsule by mouth daily.   finasteride  (PROSCAR ) 5 MG tablet Take 1 tablet (5 mg total)  by mouth daily.   guaiFENesin  (MUCINEX ) 600 MG 12 hr tablet Take 600 mg by mouth every 8 (eight) hours as needed.   guaifenesin  (ROBITUSSIN) 100 MG/5ML syrup Take 10 mLs by mouth every 4 (four) hours as needed for cough.   IRON, FERROUS GLUCONATE, PO Take 65 mg by mouth daily.   lactose free nutrition (BOOST) LIQD Take 237 mLs by mouth 3 (three) times daily between meals.   lidocaine  (LIDODERM ) 5 % Place 1 patch onto the skin daily. Remove & Discard patch within 12 hours or as directed by MD   Magnesium  Oxide 400 MG CAPS Take 1 capsule (400 mg total) by mouth daily.   metFORMIN  (GLUCOPHAGE ) 500 MG tablet TAKE 1 TABLET BY MOUTH  TWICE DAILY WITH MEALS   midodrine (PROAMATINE) 5 MG tablet  Take 5 mg by mouth 3 (three) times daily with meals as needed. (Patient not taking: Reported on 08/03/2023)   mineral oil-hydrophilic petrolatum (AQUAPHOR) ointment Apply 1 Application topically daily as needed for dry skin. To left lower leg   Polyethyl Glycol-Propyl Glycol (SYSTANE ULTRA OP) Place 1 drop into both eyes daily as needed (dry eyes).   polyethylene glycol (MIRALAX  / GLYCOLAX ) 17 g packet Take 17 g by mouth every 12 (twelve) hours as needed. Daily every Monday, Wednesday and Friday.   saccharomyces boulardii (FLORASTOR) 250 MG capsule Take 250 mg by mouth daily.   sertraline (ZOLOFT) 50 MG tablet Take 50 mg by mouth daily.   sodium chloride  (OCEAN) 0.65 % SOLN nasal spray Place 2 sprays into both nostrils 2 (two) times daily as needed for congestion.   Sodium Fluoride (PREVIDENT 5000 BOOSTER PLUS) 1.1 % PSTE Place 1 application  onto teeth at bedtime as needed.   tamsulosin  (FLOMAX ) 0.4 MG CAPS capsule Take 2 capsules (0.8 mg total) by mouth at bedtime.   Wheat Dextrin (BENEFIBER) POWD Take by mouth.  Give 1 Tbsp by mouth one time a day on odd days for bowel regulation   No facility-administered encounter medications on file as of 08/21/2023.    Review of Systems  Immunization History   Administered Date(s) Administered   Fluad Quad(high Dose 65+) 09/14/2018   H1N1 12/13/2007   Influenza Split 09/11/2010   Influenza Whole 09/12/2011   Influenza, High Dose Seasonal PF 09/20/2012, 09/07/2017   Influenza, Seasonal, Injecte, Preservative Fre 09/08/2007, 09/26/2008, 10/02/2014, 09/18/2015   Influenza,inj,Quad PF,6+ Mos 09/19/2013, 09/21/2015   Influenza-Unspecified 09/07/2017, 09/11/2019, 09/03/2020, 10/19/2021, 10/26/2022   Moderna Sars-Covid-2 Vaccination 01/18/2019, 02/15/2019, 11/19/2019, 10/17/2020, 06/01/2021   PNEUMOCOCCAL CONJUGATE-20 12/13/2021   Pneumococcal Conjugate-13 09/19/2013   Pneumococcal Polysaccharide-23 09/26/2003, 06/28/2016   Td 08/04/1998   Tdap 06/04/2010, 08/15/2021   Tetanus 08/04/1998   Unspecified SARS-COV-2 Vaccination 06/01/2021, 09/30/2022, 04/14/2023   Zoster Recombinant(Shingrix) 06/29/2022   Zoster, Live 01/16/2004   Pertinent  Health Maintenance Due  Topic Date Due   INFLUENZA VACCINE  08/04/2023   HEMOGLOBIN A1C  11/08/2023   FOOT EXAM  02/08/2024   OPHTHALMOLOGY EXAM  06/19/2024      12/05/2021   10:38 PM 12/06/2021    7:05 AM 12/06/2021    7:40 PM 12/07/2021    8:45 AM 06/28/2022    2:03 PM  Fall Risk  Falls in the past year?     1  Was there an injury with Fall?     1  Fall Risk Category Calculator     3  (RETIRED) Patient Fall Risk Level Moderate fall risk  High fall risk  High fall risk  High fall risk    Patient at Risk for Falls Due to     History of fall(s);Impaired balance/gait;Impaired mobility;Impaired vision     Data saved with a previous flowsheet row definition   Functional Status Survey:    There were no vitals filed for this visit. There is no height or weight on file to calculate BMI. Physical Exam  Labs reviewed: Recent Labs    11/07/22 0000 05/08/23 0000 05/18/23 0000  NA 138 138 139  K 4.0 4.2 4.1  CL 104 105 105  CO2 29* 30* 27*  BUN 38* 26* 30*  CREATININE 0.9 0.8 0.7  CALCIUM   --  9.9  10.0   Recent Labs    11/07/22 0000 05/08/23 0000  AST 13* 12*  ALT 13 9*  ALKPHOS 63  64  ALBUMIN 3.4* 3.2*   Recent Labs    05/08/23 0000 05/15/23 0000 05/23/23 1154 06/05/23 0000 07/11/23 1053  WBC 4.1   < > 5.1 6.0 5.5  NEUTROABS 1,915.00  --   --   --   --   HGB 6.9*   < > 7.7* 9.4* 11.0*  HCT 24*   < > 25.9* 31* 35.7*  MCV  --   --  86.3  --  93.5  PLT 98*   < > 177 120* 125*   < > = values in this interval not displayed.   Lab Results  Component Value Date   TSH 1.16 05/08/2023   Lab Results  Component Value Date   HGBA1C 6.9 05/08/2023   Lab Results  Component Value Date   CHOL 95 11/07/2022   HDL 59 11/07/2022   LDLCALC 27 11/07/2022   TRIG 31 (A) 11/07/2022   CHOLHDL 2 02/25/2021    Significant Diagnostic Results in last 30 days:  No results found.  Assessment/Plan Diarrhea Persistent diarrhea with faint bowel sounds, suggesting a non-infectious cause. Recent manual stool removal may have contributed to symptoms.  Anemia Anemia without specific symptoms or changes discussed. - Order CBC to evaluate current status of anemia.  Family/ staff Communication: nursing  Labs/tests ordered:  none

## 2023-08-24 ENCOUNTER — Encounter: Payer: Self-pay | Admitting: Nurse Practitioner

## 2023-08-24 ENCOUNTER — Non-Acute Institutional Stay (SKILLED_NURSING_FACILITY): Payer: Self-pay | Admitting: Nurse Practitioner

## 2023-08-24 DIAGNOSIS — Z0000 Encounter for general adult medical examination without abnormal findings: Secondary | ICD-10-CM | POA: Diagnosis not present

## 2023-08-24 DIAGNOSIS — I482 Chronic atrial fibrillation, unspecified: Secondary | ICD-10-CM | POA: Diagnosis not present

## 2023-08-24 DIAGNOSIS — I110 Hypertensive heart disease with heart failure: Secondary | ICD-10-CM | POA: Diagnosis not present

## 2023-08-24 LAB — CBC: RBC: 3.62 — AB (ref 3.87–5.11)

## 2023-08-24 LAB — CBC AND DIFFERENTIAL
HCT: 34 — AB (ref 41–53)
Hemoglobin: 10.8 — AB (ref 13.5–17.5)
Neutrophils Absolute: 2560
Platelets: 103 K/uL — AB (ref 150–400)
WBC: 5

## 2023-08-24 NOTE — Progress Notes (Signed)
 Subjective:   Michael Colon is a 89 y.o. male who presents for Medicare Annual/Subsequent preventive examination.  Visit Complete: In person [facility]    Cardiac Risk Factors include: sedentary lifestyle;advanced age (>39men, >69 women);male gender;hypertension;dyslipidemia     Objective:    Today's Vitals   08/24/23 0822  BP: 133/70  Pulse: 73  Resp: 16  Temp: 97.6 F (36.4 C)  SpO2: 93%  Weight: 171 lb 3.2 oz (77.7 kg)  Height: 5' 10 (1.778 m)   Body mass index is 24.56 kg/m.     08/03/2023    1:49 PM 07/11/2023   11:14 AM 05/23/2023   12:24 PM 05/23/2023   11:09 AM 05/19/2023    2:19 PM 02/17/2023    3:19 PM 12/15/2022    3:40 PM  Advanced Directives  Does Patient Have a Medical Advance Directive? Yes Yes Yes No;Yes Yes Yes Yes  Type of Estate agent of [place];Living will;Out of facility DNR (pink MOST or yellow form) Living will;Healthcare Power of [employer] of [place];Living will;Out of facility DNR (pink MOST or yellow form) Healthcare Power of [place];Living will Healthcare Power of [place];Out of facility DNR (pink MOST or yellow form);Living will Healthcare Power of [place];Out of facility DNR (pink MOST or yellow form);Living will Healthcare Power of [place];Out of facility DNR (pink MOST or yellow form);Living will  Does patient want to make changes to medical advance directive? No - Patient declined Yes (ED - Information included in AVS) No - Patient declined Yes (ED - Information included in AVS) No - Patient declined No - Patient declined No - Patient declined  Copy of Healthcare Power of Attorney in Chart? Yes - validated most recent copy scanned in chart (See row information)  Yes - validated most recent copy scanned in chart (See row information) No - copy requested Yes - validated most recent copy scanned in chart (See row information) Yes - validated most recent copy scanned in chart (See row information) Yes -  validated most recent copy scanned in chart (See row information)  Would patient like information on creating a medical advance directive?  Yes (ED - Information included in AVS) No - Patient declined        Current Medications (verified) Outpatient Encounter Medications as of 08/24/2023  Medication Sig   acetaminophen  (TYLENOL ) 500 MG tablet Take 1,000 mg by mouth every 8 (eight) hours as needed.   apixaban  (ELIQUIS ) 2.5 MG TABS tablet Take 2.5 mg by mouth 2 (two) times daily.   APPLE CIDER VINEGAR PO Take 1 capsule by mouth at bedtime.   atorvastatin  (LIPITOR ) 80 MG tablet Take 1 tablet (80 mg total) by mouth daily.   Cranberry 250 MG TABS Take 1 tablet by mouth 2 (two) times daily.   Cyanocobalamin  (B-12 PO) Take 1,000 mg by mouth daily.   FIBER PO Take 1 capsule by mouth daily.   finasteride  (PROSCAR ) 5 MG tablet Take 1 tablet (5 mg total) by mouth daily.   guaiFENesin  (MUCINEX ) 600 MG 12 hr tablet Take 600 mg by mouth every 8 (eight) hours as needed.   guaifenesin  (ROBITUSSIN) 100 MG/5ML syrup Take 10 mLs by mouth every 4 (four) hours as needed for cough.   IRON, FERROUS GLUCONATE, PO Take 65 mg by mouth daily.   lactose free nutrition (BOOST) LIQD Take 237 mLs by mouth 3 (three) times daily between meals.   lidocaine  (LIDODERM ) 5 % Place 1 patch onto the skin daily. Remove & Discard patch  within 12 hours or as directed by MD   Magnesium  Oxide 400 MG CAPS Take 1 capsule (400 mg total) by mouth daily.   metFORMIN  (GLUCOPHAGE ) 500 MG tablet TAKE 1 TABLET BY MOUTH  TWICE DAILY WITH MEALS   mineral oil-hydrophilic petrolatum (AQUAPHOR) ointment Apply 1 Application topically daily as needed for dry skin. To left lower leg   Polyethyl Glycol-Propyl Glycol (SYSTANE ULTRA OP) Place 1 drop into both eyes daily as needed (dry eyes).   polyethylene glycol (MIRALAX  / GLYCOLAX ) 17 g packet Take 17 g by mouth every 12 (twelve) hours as needed. Daily every Monday, Wednesday and Friday.   saccharomyces  boulardii (FLORASTOR) 250 MG capsule Take 250 mg by mouth daily.   sertraline (ZOLOFT) 50 MG tablet Take 50 mg by mouth daily.   sodium chloride  (OCEAN) 0.65 % SOLN nasal spray Place 2 sprays into both nostrils 2 (two) times daily as needed for congestion.   Sodium Fluoride (PREVIDENT 5000 BOOSTER PLUS) 1.1 % PSTE Place 1 application  onto teeth at bedtime as needed.   tamsulosin  (FLOMAX ) 0.4 MG CAPS capsule Take 2 capsules (0.8 mg total) by mouth at bedtime.   Wheat Dextrin (BENEFIBER) POWD Take by mouth.  Give 1 Tbsp by mouth one time a day on odd days for bowel regulation   amoxicillin-clavulanate (AUGMENTIN) 875-125 MG tablet Take 1 tablet by mouth 2 (two) times daily. (Patient not taking: Reported on 08/24/2023)   midodrine (PROAMATINE) 5 MG tablet Take 5 mg by mouth 3 (three) times daily with meals as needed. (Patient not taking: Reported on 08/24/2023)   No facility-administered encounter medications on file as of 08/24/2023.    Allergies (verified) Lovenox [enoxaparin], Proscar  [finasteride ], Latex, Nickel, Percocet [oxycodone-acetaminophen ], and Tape   History: Past Medical History:  Diagnosis Date   Asthma    Atrial flutter (HCC) 2007   Band keratopathy    Benign prostatic hypertrophy    Chronic ear infection    Chronic kidney disease    Chronic prostatitis    Dental bridge present    permanent - upper   Diabetes mellitus    Elbow stiffness, left    s/p fracture many yrs ago.  arm does not straighten   Encounter for anticoagulation discussion and counseling 07/21/2010   Fall 12/04/2021   GERD (gastroesophageal reflux disease)    laryngeal involvement   Hyperlipidemia    Hypertension    Obstructive sleep apnea    CPAP-9   Orchitis of right testicle 11/30/2020   Osteoarthrosis, localized, primary, knee    post-traumatic   Prostatitis    Sinus drainage 03/11/2022   Skin cancer    Stroke [facility])    TIA   Tachy-brady syndrome (HCC)    UTI (urinary tract infection)     Past Surgical History:  Procedure Laterality Date   APPENDECTOMY     BELPHAROPTOSIS REPAIR     Dr [name]---didn't resolve weepy eye and eyelid drooping   CARDIOVERSION  04/13/2010   CATARACT EXTRACTION, BILATERAL  2009   Chest pain  8/12   Stress test benign   ESOPHAGEAL DILATION  05/26/2016   Procedure: ESOPHAGEAL DILATION;  Surgeon: Jinny Carmine, MD;  Location: [facility];  Service: Endoscopy;;   ESOPHAGOGASTRODUODENOSCOPY (EGD) WITH PROPOFOL  N/A 05/26/2016   Procedure: ESOPHAGOGASTRODUODENOSCOPY (EGD) WITH PROPOFOL ;  Surgeon: Jinny Carmine, MD;  Location: [facility];  Service: Endoscopy;  Laterality: N/A;  Diabetic - oral meds sleep apnea   EYE SURGERY     FRACTURE SURGERY Left  elbow   KNEE SURGERY  1998   plate after fracture, then removed for infection   left elbow surgery     MASTOIDECTOMY  8/08   Dr Rena   PACEMAKER IMPLANT N/A 09/02/2021   Procedure: PACEMAKER IMPLANT;  Surgeon: Cindie Ole DASEN, MD;  Location: [facility] INV[facility] CV LAB;  Service: Cardiovascular;  Laterality: N/A;   RHINOPLASTY  5/10   and septoplasty   SUBACROMIAL DECOMPRESSION Right 2005   Arthroscopic (for rotator cuff and biceps tendon ruptures)   TEAR DUCT PROBING  11/13   Dr Ashley   TOTAL KNEE ARTHROPLASTY Left 09/01/2014   Procedure: TOTAL KNEE ARTHROPLASTY;  Surgeon: Lynwood SHAUNNA Hue, MD;  Location: [facility] ORS;  Service: Orthopedics;  Laterality: Left;   TRIGGER FINGER RELEASE Left 02/25/2015   Procedure: LEFT LONG TRIGGER RELEASE;  Surgeon: Lynwood SHAUNNA Hue, MD;  Location: [facility] ORS;  Service: Orthopedics;  Laterality: Left;   VENOUS ABLATION     Family History  Problem Relation Age of Onset   Colon cancer Father    Diabetes Father    Hypertension Father    Other Mother        natural causes   Heart attack Neg Hx    Stroke Neg Hx    Social History   Socioeconomic History   Marital status: Widowed    Spouse name: Not on file   Number of children: 2   Years of education:  Not on file   Highest education level: Not on file  Occupational History   Occupation: Education officer, environmental    Comment: Retired--[employer]  Tobacco Use   Smoking status: Former    Current packs/day: 0.00    Types: Cigarettes    Start date: 01/03/1969    Quit date: 01/03/1969    Years since quitting: 54.6    Passive exposure: Never   Smokeless tobacco: Never  Vaping Use   Vaping status: Never Used  Substance and Sexual Activity   Alcohol  use: No    Alcohol /week: 1.0 standard drink of alcohol     Types: 1 Glasses of wine per week    Comment: rare wine. 1-2x/yr.   Drug use: No   Sexual activity: Not Currently  Other Topics Concern   Not on file  Social History Narrative   Has living will   Health care POA-- son Alm   Has DNR order from the past---form redone 06/25/10   Probably no feeding tube if cognitively unaware   Social Drivers of Health   Financial Resource Strain: Not on file  Food Insecurity: No Food Insecurity (05/23/2023)   Hunger Vital Sign    Worried About Running Out of Food in the Last Year: Never true    Ran Out of Food in the Last Year: Never true  Transportation Needs: No Transportation Needs (05/23/2023)   PRAPARE - Administrator, Civil Service (Medical): No    Lack of Transportation (Non-Medical): No  Physical Activity: Not on file  Stress: Not on file  Social Connections: Not on file    Tobacco Counseling Counseling given: Not Answered   Clinical Intake:  Pre-visit preparation completed: Yes  Pain : No/denies pain     BMI - recorded: 24 Nutritional Status: BMI of 19-24  Normal Nutritional Risks: None  How often do you need to have someone help you when you read instructions, pamphlets, or other written materials from your doctor or pharmacy?: 4 - Often         Activities of Daily Living  08/24/2023    9:13 AM  In your present state of health, do you have any difficulty performing the following activities:  Hearing? 1  Vision? 1   Difficulty concentrating or making decisions? 1  Walking or climbing stairs? 1  Dressing or bathing? 1  Doing errands, shopping? 1  Preparing Food and eating ? Y  Using the Toilet? Y  In the past six months, have you accidently leaked urine? Y  Do you have problems with loss of bowel control? Y  Managing your Medications? Y  Managing your Finances? Y  Housekeeping or managing your Housekeeping? Y    Patient Care Team: Abdul Fine, MD as PCP - General (Family Medicine) Perla Evalene PARAS, MD as PCP - Cardiology (Cardiology) Perla Evalene PARAS, MD as Consulting Physician (Cardiology) Melanee Annah BROCKS, MD as Consulting Physician (Hematology and Oncology) Pa, [facility] (Optometry)  Indicate any recent Medical Services you may have received from other than [facility] providers in the past year (date may be approximate).     Assessment:   This is a routine wellness examination for Kj.  Hearing/Vision screen No results found.   Goals Addressed   None    Depression Screen    08/24/2023    9:14 AM 05/23/2023   11:21 AM 02/25/2021    2:47 PM 02/21/2020   10:30 AM 02/15/2019    9:47 AM 01/02/2018    9:21 AM 06/28/2016    9:48 AM  PHQ 2/9 Scores  PHQ - 2 Score 1 0 0 0 1 0 0    Fall Risk    08/24/2023    9:14 AM 06/28/2022    2:03 PM 02/25/2021    2:47 PM 02/21/2020   10:30 AM 02/15/2019    9:47 AM  Fall Risk   Falls in the past year? 1 1 1  0 0   Number falls in past yr: 1 1 0    Injury with Fall? 0 1 0    Risk for fall due to : History of fall(s);Impaired balance/gait;Impaired mobility History of fall(s);Impaired balance/gait;Impaired mobility;Impaired vision        Data saved with a previous flowsheet row definition    MEDICARE RISK AT HOME: Medicare Risk at Home Any stairs in or around the home?: No Home free of loose throw rugs in walkways, pet beds, electrical cords, etc?: Yes Adequate lighting in your home to reduce risk of falls?: Yes Life alert?: No Use  of a cane, walker or w/c?: Yes Grab bars in the bathroom?: Yes Shower chair or bench in shower?: Yes Elevated toilet seat or a handicapped toilet?: Yes  TIMED UP AND GO:  Was the test performed?  No    Cognitive Function:        Immunizations Immunization History  Administered Date(s) Administered   Fluad Quad(high Dose 65+) 09/14/2018   H1N1 12/13/2007   Influenza Split 09/11/2010   Influenza Whole 09/12/2011   Influenza, High Dose Seasonal PF 09/20/2012, 09/07/2017   Influenza, Seasonal, Injecte, Preservative Fre 09/08/2007, 09/26/2008, 10/02/2014, 09/18/2015   Influenza,inj,Quad PF,6+ Mos 09/19/2013, 09/21/2015   Influenza-Unspecified 09/07/2017, 09/11/2019, 09/03/2020, 10/19/2021, 10/26/2022   Moderna Sars-Covid-2 Vaccination 01/18/2019, 02/15/2019, 11/19/2019, 10/17/2020, 06/01/2021   PNEUMOCOCCAL CONJUGATE-20 12/13/2021   Pneumococcal Conjugate-13 09/19/2013   Pneumococcal Polysaccharide-23 09/26/2003, 06/28/2016   Td 08/04/1998   Tdap 06/04/2010, 08/15/2021   Tetanus 08/04/1998   Unspecified SARS-COV-2 Vaccination 06/01/2021, 09/30/2022, 04/14/2023   Zoster Recombinant(Shingrix) 06/29/2022   Zoster, Live 01/16/2004    TDAP status: Up to  date  Flu Vaccine status: Due, Education has been provided regarding the importance of this vaccine. Advised may receive this vaccine at local pharmacy or Health Dept. Aware to provide a copy of the vaccination record if obtained from local pharmacy or Health Dept. Verbalized acceptance and understanding.  Pneumococcal vaccine status: Up to date  Covid-19 vaccine status: Information provided on how to obtain vaccines.   Qualifies for Shingles Vaccine? Yes   Zostavax completed No   Shingrix Completed?: Yes  Screening Tests Health Maintenance  Topic Date Due   Zoster Vaccines- Shingrix (2 of 2) 08/24/2022   INFLUENZA VACCINE  08/04/2023   COVID-19 Vaccine (8 - 2024-25 season) 04/13/2024 (Originally 06/09/2023)   HEMOGLOBIN A1C   11/08/2023   FOOT EXAM  02/08/2024   OPHTHALMOLOGY EXAM  06/19/2024   Medicare Annual Wellness (AWV)  08/23/2024   DTaP/Tdap/Td (4 - Td or Tdap) 08/16/2031   Pneumococcal Vaccine: 50+ Years  Completed   HPV VACCINES  Aged Out   Meningococcal B Vaccine  Aged Out    Health Maintenance  Health Maintenance Due  Topic Date Due   Zoster Vaccines- Shingrix (2 of 2) 08/24/2022   INFLUENZA VACCINE  08/04/2023    Colorectal cancer screening: No longer required.   Lung Cancer Screening: (Low Dose CT Chest recommended if Age 27-80 years, 20 pack-year currently smoking OR have quit w/in 15years.) does not qualify.   Lung Cancer Screening Referral: na  Additional Screening:  Hepatitis C Screening: does qualify; Completed na  Vision Screening: Recommended annual ophthalmology exams for early detection of glaucoma and other disorders of the eye. Is the patient up to date with their annual eye exam?  Yes  Who is the provider or what is the name of the office in which the patient attends annual eye exams? [facility] If pt is not established with a provider, would they like to be referred to a provider to establish care? No .   Dental Screening: Recommended annual dental exams for proper oral hygiene   Community Resource Referral / Chronic Care Management: CRR required this visit?  No   CCM required this visit?  No     Plan:     I have personally reviewed and noted the following in the patient's chart:   Medical and social history Use of alcohol , tobacco or illicit drugs  Current medications and supplements including opioid prescriptions. Patient is not currently taking opioid prescriptions. Functional ability and status Nutritional status Physical activity Advanced directives List of other physicians Hospitalizations, surgeries, and ER visits in previous 12 months Vitals Screenings to include cognitive, depression, and falls Referrals and appointments  In addition, I  have reviewed and discussed with patient certain preventive protocols, quality metrics, and best practice recommendations. A written personalized care plan for preventive services as well as general preventive health recommendations were provided to patient.     Harlene MARLA An, NP   08/24/2023

## 2023-08-25 DIAGNOSIS — B351 Tinea unguium: Secondary | ICD-10-CM | POA: Diagnosis not present

## 2023-08-25 DIAGNOSIS — E1159 Type 2 diabetes mellitus with other circulatory complications: Secondary | ICD-10-CM | POA: Diagnosis not present

## 2023-09-11 ENCOUNTER — Ambulatory Visit: Payer: Medicare Other

## 2023-09-11 DIAGNOSIS — I442 Atrioventricular block, complete: Secondary | ICD-10-CM

## 2023-09-11 DIAGNOSIS — D649 Anemia, unspecified: Secondary | ICD-10-CM | POA: Diagnosis not present

## 2023-09-11 LAB — CBC AND DIFFERENTIAL
HCT: 42 (ref 41–53)
Hemoglobin: 13.2 — AB (ref 13.5–17.5)
Platelets: 103 K/uL — AB (ref 150–400)
WBC: 7.4

## 2023-09-11 LAB — IRON,TIBC AND FERRITIN PANEL
%SAT: 27
Ferritin: 26
Iron: 83
TIBC: 305

## 2023-09-11 LAB — CBC: RBC: 4.33 (ref 3.87–5.11)

## 2023-09-12 LAB — CUP PACEART REMOTE DEVICE CHECK
Battery Remaining Longevity: 102 mo
Battery Remaining Percentage: 100 %
Brady Statistic RA Percent Paced: 22 %
Brady Statistic RV Percent Paced: 93 %
Date Time Interrogation Session: 20250908000000
Implantable Lead Connection Status: 753985
Implantable Lead Connection Status: 753985
Implantable Lead Implant Date: 20230831
Implantable Lead Implant Date: 20230831
Implantable Lead Location: 753859
Implantable Lead Location: 753860
Implantable Lead Model: 7841
Implantable Lead Model: 7842
Implantable Lead Serial Number: 1220926
Implantable Lead Serial Number: 1322603
Implantable Pulse Generator Implant Date: 20230831
Lead Channel Impedance Value: 502 Ohm
Lead Channel Impedance Value: 516 Ohm
Lead Channel Pacing Threshold Amplitude: 0.5 V
Lead Channel Pacing Threshold Amplitude: 1.1 V
Lead Channel Pacing Threshold Pulse Width: 0.4 ms
Lead Channel Pacing Threshold Pulse Width: 0.4 ms
Lead Channel Setting Pacing Amplitude: 3.5 V
Lead Channel Setting Pacing Amplitude: 3.5 V
Lead Channel Setting Pacing Pulse Width: 0.4 ms
Lead Channel Setting Sensing Sensitivity: 3.5 mV
Pulse Gen Serial Number: 613820
Zone Setting Status: 755011

## 2023-09-17 ENCOUNTER — Ambulatory Visit: Payer: Self-pay | Admitting: Cardiology

## 2023-09-20 ENCOUNTER — Encounter: Payer: Self-pay | Admitting: Pharmacist

## 2023-09-20 NOTE — Progress Notes (Signed)
   09/20/2023  Patient ID: Michael Colon, male   DOB: 24-Nov-1930, 89 y.o.   MRN: [id number]  Pharmacy Quality Measure Review  This patient is appearing on a report for being at risk of failing the adherence measure for cholesterol (statin) medications this calendar year.   Medication: Atorvastatin  80 mg  Last fill date: 07/27/23 for 30 day supply  Patient is in a skilled nursing facility.  His medications are filled by [facility]. They fill Patients' medications in 7-day cycle packs and do monthly composite billing. Atorvastatin  has been filled every month this year and is listed as a continued medication on his last office visits.  Cassius DOROTHA Brought, PharmD, BCACP Clinical Pharmacist [phone]

## 2023-09-21 NOTE — Progress Notes (Signed)
 Remote PPM Transmission

## 2023-09-26 ENCOUNTER — Encounter: Payer: Self-pay | Admitting: Nurse Practitioner

## 2023-09-26 ENCOUNTER — Non-Acute Institutional Stay (SKILLED_NURSING_FACILITY): Admitting: Nurse Practitioner

## 2023-09-26 DIAGNOSIS — D509 Iron deficiency anemia, unspecified: Secondary | ICD-10-CM

## 2023-09-26 DIAGNOSIS — E1140 Type 2 diabetes mellitus with diabetic neuropathy, unspecified: Secondary | ICD-10-CM | POA: Diagnosis not present

## 2023-09-26 DIAGNOSIS — I5032 Chronic diastolic (congestive) heart failure: Secondary | ICD-10-CM | POA: Diagnosis not present

## 2023-09-26 DIAGNOSIS — K5900 Constipation, unspecified: Secondary | ICD-10-CM | POA: Diagnosis not present

## 2023-09-26 DIAGNOSIS — N401 Enlarged prostate with lower urinary tract symptoms: Secondary | ICD-10-CM

## 2023-09-26 DIAGNOSIS — I10 Essential (primary) hypertension: Secondary | ICD-10-CM | POA: Diagnosis not present

## 2023-09-26 DIAGNOSIS — I482 Chronic atrial fibrillation, unspecified: Secondary | ICD-10-CM | POA: Diagnosis not present

## 2023-09-26 DIAGNOSIS — N138 Other obstructive and reflux uropathy: Secondary | ICD-10-CM

## 2023-09-26 NOTE — Progress Notes (Signed)
 Location:  Other [place].  Nursing Home Room Number: [facility] [id number] Place of Service:  SNF [phone]) [name], NP  PCP: Abdul Fine, MD  Patient Care Team: Abdul Fine, MD as PCP - General (Family Medicine) Perla Evalene PARAS, MD as PCP - Cardiology (Cardiology) Perla Evalene PARAS, MD as Consulting Physician (Cardiology) Melanee Annah BROCKS, MD as Consulting Physician (Hematology and Oncology) Pa, [facility])  Extended Emergency Contact Information Primary Emergency Contact: Rocky Alm [facility]) Address: [address]          [place], [place] [address] [place] Phone: [phone] Relation: Son  Goals of care: Advanced Directive information    08/03/2023    1:49 PM  Advanced Directives  Does Patient Have a Medical Advance Directive? Yes  Type of Estate agent of [place];Living will;Out of facility DNR (pink MOST or yellow form)  Does patient want to make changes to medical advance directive? No - Patient declined  Copy of Healthcare Power of Attorney in Chart? Yes - validated most recent copy scanned in chart (See row information)     Chief Complaint  Patient presents with   Medical Management of Chronic Issues    Medical Management of Chronic Issues.     HPI:  Pt is a 89 y.o. male seen today for medical management of chronic disease. Pt with hx of anemia, asthma, DM, bph.  Pt reports he is doing well.  Bowels moving well at this time Eats what he wants to eat. Reports mood is good.  Denies shortness of breath or chest pains. No LE edema.  Denies pains Nursing without concerns.   Past Medical History:  Diagnosis Date   Asthma    Atrial flutter (HCC) 2007   Band keratopathy    Benign prostatic hypertrophy    Chronic ear infection    Chronic kidney disease    Chronic prostatitis    Dental bridge present    permanent - upper   Diabetes mellitus    Elbow stiffness, left    s/p  fracture many yrs ago.  arm does not straighten   Encounter for anticoagulation discussion and counseling 07/21/2010   Fall 12/04/2021   GERD (gastroesophageal reflux disease)    laryngeal involvement   Hyperlipidemia    Hypertension    Obstructive sleep apnea    CPAP-9   Orchitis of right testicle 11/30/2020   Osteoarthrosis, localized, primary, knee    post-traumatic   Prostatitis    Sinus drainage 03/11/2022   Skin cancer    Stroke [facility])    TIA   Tachy-brady syndrome (HCC)    UTI (urinary tract infection)    Past Surgical History:  Procedure Laterality Date   APPENDECTOMY     BELPHAROPTOSIS REPAIR     Dr [name]---didn't resolve weepy eye and eyelid drooping   CARDIOVERSION  04/13/2010   CATARACT EXTRACTION, BILATERAL  2009   Chest pain  8/12   Stress test benign   ESOPHAGEAL DILATION  05/26/2016   Procedure: ESOPHAGEAL DILATION;  Surgeon: Jinny Carmine, MD;  Location: [facility];  Service: Endoscopy;;   ESOPHAGOGASTRODUODENOSCOPY (EGD) WITH PROPOFOL  N/A 05/26/2016   Procedure: ESOPHAGOGASTRODUODENOSCOPY (EGD) WITH PROPOFOL ;  Surgeon: Jinny Carmine, MD;  Location: [facility];  Service: Endoscopy;  Laterality: N/A;  Diabetic - oral meds sleep apnea   EYE SURGERY     FRACTURE SURGERY Left    elbow   KNEE SURGERY  1998   plate after fracture, then  removed for infection   left elbow surgery     MASTOIDECTOMY  8/08   Dr Rena   PACEMAKER IMPLANT N/A 09/02/2021   Procedure: PACEMAKER IMPLANT;  Surgeon: Cindie Ole DASEN, MD;  Location: [facility] INVASIVE CV LAB;  Service: Cardiovascular;  Laterality: N/A;   RHINOPLASTY  5/10   and septoplasty   SUBACROMIAL DECOMPRESSION Right 2005   Arthroscopic (for rotator cuff and biceps tendon ruptures)   TEAR DUCT PROBING  11/13   Dr Ashley   TOTAL KNEE ARTHROPLASTY Left 09/01/2014   Procedure: TOTAL KNEE ARTHROPLASTY;  Surgeon: Lynwood SHAUNNA Hue, MD;  Location: [facility] ORS;  Service: Orthopedics;  Laterality: Left;   TRIGGER  FINGER RELEASE Left 02/25/2015   Procedure: LEFT LONG TRIGGER RELEASE;  Surgeon: Lynwood SHAUNNA Hue, MD;  Location: [facility] ORS;  Service: Orthopedics;  Laterality: Left;   VENOUS ABLATION      Allergies  Allergen Reactions   Lovenox [Enoxaparin] Hives   Proscar  [Finasteride ] Other (See Comments)    Upper body and arm weakness   Latex Rash    Has trouble with BANDAIDS that have been left on for more than 24 hours. Prefers paper tape.   Nickel Rash    Localized rash   Percocet [Oxycodone-Acetaminophen ] Rash   Tape Rash and Other (See Comments)    Sensitivity     Outpatient Encounter Medications as of 09/26/2023  Medication Sig   acetaminophen  (TYLENOL ) 500 MG tablet Take 1,000 mg by mouth every 8 (eight) hours as needed.   apixaban  (ELIQUIS ) 2.5 MG TABS tablet Take 2.5 mg by mouth 2 (two) times daily.   APPLE CIDER VINEGAR PO Take 1 capsule by mouth at bedtime.   atorvastatin  (LIPITOR ) 80 MG tablet Take 1 tablet (80 mg total) by mouth daily.   Cranberry 250 MG TABS Take 1 tablet by mouth 2 (two) times daily.   Cyanocobalamin  (B-12 PO) Take 1,000 mg by mouth daily.   FIBER PO Take 1 capsule by mouth daily.   finasteride  (PROSCAR ) 5 MG tablet Take 1 tablet (5 mg total) by mouth daily.   guaiFENesin  (MUCINEX ) 600 MG 12 hr tablet Take 600 mg by mouth every 8 (eight) hours as needed.   guaifenesin  (ROBITUSSIN) 100 MG/5ML syrup Take 10 mLs by mouth every 4 (four) hours as needed for cough.   IRON, FERROUS GLUCONATE, PO Take 65 mg by mouth daily.   lactose free nutrition (BOOST) LIQD Take 237 mLs by mouth 3 (three) times daily between meals.   lidocaine  (LIDODERM ) 5 % Place 1 patch onto the skin daily. Remove & Discard patch within 12 hours or as directed by MD   Magnesium  Oxide 400 MG CAPS Take 1 capsule (400 mg total) by mouth daily.   metFORMIN  (GLUCOPHAGE ) 500 MG tablet TAKE 1 TABLET BY MOUTH  TWICE DAILY WITH MEALS   mineral oil-hydrophilic petrolatum (AQUAPHOR) ointment Apply 1 Application  topically daily as needed for dry skin. To left lower leg   Polyethyl Glycol-Propyl Glycol (SYSTANE ULTRA OP) Place 1 drop into both eyes daily as needed (dry eyes).   polyethylene glycol (MIRALAX  / GLYCOLAX ) 17 g packet Take 17 g by mouth every 12 (twelve) hours as needed. Daily every Monday, Wednesday and Friday.   saccharomyces boulardii (FLORASTOR) 250 MG capsule Take 250 mg by mouth daily.   sertraline (ZOLOFT) 50 MG tablet Take 50 mg by mouth daily.   sodium chloride  (OCEAN) 0.65 % SOLN nasal spray Place 2 sprays into both nostrils 2 (two) times daily as  needed for congestion.   Sodium Fluoride (PREVIDENT 5000 BOOSTER PLUS) 1.1 % PSTE Place 1 application  onto teeth at bedtime as needed.   tamsulosin  (FLOMAX ) 0.4 MG CAPS capsule Take 2 capsules (0.8 mg total) by mouth at bedtime.   amoxicillin-clavulanate (AUGMENTIN) 875-125 MG tablet Take 1 tablet by mouth 2 (two) times daily. (Patient not taking: Reported on 09/26/2023)   midodrine (PROAMATINE) 5 MG tablet Take 5 mg by mouth 3 (three) times daily with meals as needed. (Patient not taking: Reported on 09/26/2023)   Wheat Dextrin (BENEFIBER) POWD Take by mouth.  Give 1 Tbsp by mouth one time a day on odd days for bowel regulation (Patient not taking: Reported on 09/26/2023)   No facility-administered encounter medications on file as of 09/26/2023.    Review of Systems  Constitutional:  Negative for chills and fever.  HENT:  Negative for tinnitus.   Respiratory:  Negative for cough and shortness of breath.   Cardiovascular:  Negative for chest pain, palpitations and leg swelling.  Gastrointestinal:  Negative for abdominal pain, constipation and diarrhea.  Genitourinary:  Negative for dysuria, frequency and urgency.  Musculoskeletal:  Negative for back pain and myalgias.  Skin: Negative.   Neurological:  Negative for dizziness and headaches.  Psychiatric/Behavioral:  Negative for behavioral problems and sleep disturbance. The patient is not  nervous/anxious.      Immunization History  Administered Date(s) Administered   Fluad Quad(high Dose 65+) 09/14/2018   H1N1 12/13/2007   INFLUENZA, HIGH DOSE SEASONAL PF 09/20/2012, 09/07/2017   Influenza Split 09/11/2010   Influenza Whole 09/12/2011   Influenza, Seasonal, Injecte, Preservative Fre 09/08/2007, 09/26/2008, 10/02/2014, 09/18/2015   Influenza,inj,Quad PF,6+ Mos 09/19/2013, 09/21/2015   Influenza-Unspecified 09/07/2017, 09/11/2019, 09/03/2020, 10/19/2021, 10/26/2022   Moderna Sars-Covid-2 Vaccination 01/18/2019, 02/15/2019, 11/19/2019, 10/17/2020, 06/01/2021   PNEUMOCOCCAL CONJUGATE-20 12/13/2021   Pneumococcal Conjugate-13 09/19/2013   Pneumococcal Polysaccharide-23 09/26/2003, 06/28/2016   Td 08/04/1998   Tdap 06/04/2010, 08/15/2021   Tetanus 08/04/1998   Unspecified SARS-COV-2 Vaccination 06/01/2021, 09/30/2022, 04/14/2023   Zoster Recombinant(Shingrix) 06/29/2022   Zoster, Live 01/16/2004   Pertinent  Health Maintenance Due  Topic Date Due   Influenza Vaccine  08/04/2023   HEMOGLOBIN A1C  11/08/2023   FOOT EXAM  02/08/2024   OPHTHALMOLOGY EXAM  06/19/2024      12/06/2021    7:05 AM 12/06/2021    7:40 PM 12/07/2021    8:45 AM 06/28/2022    2:03 PM 08/24/2023    9:14 AM  Fall Risk  Falls in the past year?    1 1  Was there an injury with Fall?    1 0  Fall Risk Category Calculator    3 2  (RETIRED) Patient Fall Risk Level High fall risk  High fall risk  High fall risk     Patient at Risk for Falls Due to    History of fall(s);Impaired balance/gait;Impaired mobility;Impaired vision History of fall(s);Impaired balance/gait;Impaired mobility     Data saved with a previous flowsheet row definition   Functional Status Survey:    Vitals:   09/26/23 1359  BP: 130/78  Pulse: 72  Resp: 18  Temp: 97.9 F (36.6 C)  SpO2: 98%  Weight: 173 lb (78.5 kg)  Height: 5' 10 (1.778 m)   Body mass index is 24.82 kg/m. Wt Readings from Last 3 Encounters:  09/26/23  173 lb (78.5 kg)  08/24/23 171 lb 3.2 oz (77.7 kg)  08/03/23 170 lb 6.4 oz (77.3 kg)    Physical Exam  Constitutional:      General: He is not in acute distress.    Appearance: He is well-developed. He is not diaphoretic.  HENT:     Head: Normocephalic and atraumatic.     Right Ear: External ear normal.     Left Ear: External ear normal.     Mouth/Throat:     Pharynx: No oropharyngeal exudate.  Eyes:     Conjunctiva/sclera: Conjunctivae normal.     Pupils: Pupils are equal, round, and reactive to light.  Cardiovascular:     Rate and Rhythm: Normal rate and regular rhythm.     Heart sounds: Normal heart sounds.  Pulmonary:     Effort: Pulmonary effort is normal.     Breath sounds: Normal breath sounds.  Abdominal:     General: Bowel sounds are normal.     Palpations: Abdomen is soft.  Musculoskeletal:        General: No tenderness.     Cervical back: Normal range of motion and neck supple.     Right lower leg: No edema.     Left lower leg: No edema.  Skin:    General: Skin is warm and dry.  Neurological:     Mental Status: He is alert and oriented to person, place, and time.     Motor: Weakness present.     Gait: Gait abnormal.     Labs reviewed: Recent Labs    11/07/22 0000 05/08/23 0000 05/18/23 0000  NA 138 138 139  K 4.0 4.2 4.1  CL 104 105 105  CO2 29* 30* 27*  BUN 38* 26* 30*  CREATININE 0.9 0.8 0.7  CALCIUM   --  9.9 10.0   Recent Labs    11/07/22 0000 05/08/23 0000  AST 13* 12*  ALT 13 9*  ALKPHOS 63 64  ALBUMIN 3.4* 3.2*   Recent Labs    05/08/23 0000 05/15/23 0000 05/23/23 1154 06/05/23 0000 07/11/23 1053 08/24/23 0000 09/11/23 0000  WBC 4.1   < > 5.1   < > 5.5 5.0 7.4  NEUTROABS 1,915.00  --   --   --   --  2,560.00  --   HGB 6.9*   < > 7.7*   < > 11.0* 10.8* 13.2*  HCT 24*   < > 25.9*   < > 35.7* 34* 42  MCV  --   --  86.3  --  93.5  --   --   PLT 98*   < > 177   < > 125* 103* 103*   < > = values in this interval not displayed.    Lab Results  Component Value Date   TSH 1.16 05/08/2023   Lab Results  Component Value Date   HGBA1C 6.9 05/08/2023   Lab Results  Component Value Date   CHOL 95 11/07/2022   HDL 59 11/07/2022   LDLCALC 27 11/07/2022   TRIG 31 (A) 11/07/2022   CHOLHDL 2 02/25/2021    Significant Diagnostic Results in last 30 days:  CUP PACEART REMOTE DEVICE CHECK Result Date: 09/12/2023 PPM Scheduled remote reviewed. Normal device function.  Presenting rhythm: AP-AS/VP-PVC with PAC ectopy. Next remote 91 days. MC, CVRS   Assessment/Plan Atrial fibrillation, chronic (HCC) Rate controlled, continues on eliquis  for anticoagulation   BPH with obstruction/lower urinary tract symptoms Stable on flomax  and proscar .   Chronic diastolic heart failure (HCC) Euvolemic at this time.   Constipation Constipation with diarrhea. Currently on bowel regimen. Will continue at this time.  Essential hypertension Blood pressure well controlled, goal bp <140/90 Continue current medications and dietary modifications follow metabolic panel  Iron deficiency anemia He has been followed by hematology, hgb conitnues to trend up.  No signs of blood loss.   Type 2 diabetes, controlled, with neuropathy (HCC) A1c controlled on last lab, continues on metformin .  Follow up A1c every 6 months.      Michael Colon K. Caro BODILY [facility] [phone]

## 2023-09-26 NOTE — Assessment & Plan Note (Signed)
 Rate controlled, continues on eliquis for anticoagulation

## 2023-09-26 NOTE — Assessment & Plan Note (Signed)
 Stable on flomax and proscar.

## 2023-09-26 NOTE — Assessment & Plan Note (Signed)
 Constipation with diarrhea. Currently on bowel regimen. Will continue at this time.

## 2023-09-26 NOTE — Assessment & Plan Note (Signed)
 Blood pressure well controlled, goal bp <140/90 Continue current medications and dietary modifications follow metabolic panel

## 2023-09-26 NOTE — Assessment & Plan Note (Signed)
 A1c controlled on last lab, continues on metformin .  Follow up A1c every 6 months.

## 2023-09-26 NOTE — Assessment & Plan Note (Signed)
 Euvolemic at this time. ?

## 2023-09-26 NOTE — Assessment & Plan Note (Signed)
 He has been followed by hematology, hgb conitnues to trend up.  No signs of blood loss.

## 2023-09-27 ENCOUNTER — Encounter: Payer: Self-pay | Admitting: Oncology

## 2023-09-28 NOTE — Progress Notes (Unsigned)
 Cardiology Clinic Note   Date: 09/29/2023 ID: Michael Colon 1930/09/25, MRN [id number]  Primary Cardiologist:  Evalene Lunger, MD  Chief Complaint   Michael Colon is a 89 y.o. male who presents to the clinic today for routine follow up.   Patient Profile   Michael Colon is followed by Dr. Gollan for the history outlined below.      Past medical history significant for: CAD. CT cardiac scoring 04/06/2015: Coronary calcium  score 1349. Nuclear stress test 04/15/2015: Low risk scan with no significant ischemia. PAF. Ablation 2014. Complete heart block. S/p PPM 09/02/2021. Remote device check 09/11/2023: Normal device function.  AV-V dual paced.  Battery and lead parameters stable with stable capture and sensing.  Device programming appropriate. Chronic HFpEF. Echo 09/02/2021: EF 55 to 60%.  No RWMA.  Mild concentric LVH.  Grade II DD.  Elevated LVEDP.  Normal RV size/function.  Mildly elevated PA pressure, RVSP 42.9 mmHg.  Severe LAE.  Mild RAE.  Mild MR.  Mild aortic valve calcification/thickening without stenosis.  Mild dilatation of aortic root 40 mm, mild dilatation of ascending aorta 41 mm. Hypertension. Hyperlipidemia. Lipid panel 11/07/2022: LDL 27, HDL 59, TG 31, total of 95. CVA. Asthma. OSA. GERD. T2DM. Frequent falls. Anemia.  In summary, patient underwent prior Holter monitor showed sinus rhythm with pauses up to 2.86 seconds, bradycardia with rates into the 20s to 30s bpm overnight and 30s to 50s bpm during the day.  In this setting, he was evaluated by EP for bradycardia with PPM not recommended at that time.  Prior echo from 2011 demonstrated an EF 65 to 70%, diastolic dysfunction, degenerative mitral valve with mitral valve prolapse and trace to mild regurgitation, dilated left atrium, aortic atherosclerosis, dilated thoracic aorta, normal RV systolic function, trivial tricuspid regurgitation.  Calcium  score from April 2017 of 1349.  In this setting, he underwent  Lexiscan MPI in April 2017 which showed no evidence of ischemia.  Echo October 2020 demonstrated an EF of 65 to 70%, severe asymmetric LVH of the basal septal segment, indeterminate LV diastolic function parameters, normal RV systolic function with mildly enlarged ventricular cavity size, mildly elevated PASP, moderately dilated left atrium, trivial mitral regurgitation, mild to moderate aortic valve sclerosis without evidence of stenosis, mildly dilated ascending aorta measuring 38 mm, and an estimated right atrial pressure of 8 mmHg.   He was seen in the ED on 08/15/2021 after sustaining a mechanical fall, losing his balance while going down the stairs and hitting his head on a piece of furniture with a sharp corner.  He had a laceration to the head.  He did not suffer LOC.  CT head showed no acute intracranial findings.  CT cervical spine showed no recent fracture.  Incidentally noted was a larger than right left thyroid  lobe with scattered coarse calcifications suggestive of goiter.  He has subsequently undergone ultrasound of the thyroid  and FNA.  EKG concerning for high-grade AV block.  His laceration was repaired with staples.  Upon office follow-up on 09/01/2021 EKG demonstrated complete heart block.  Case was discussed with Dr. Cindie in EP with recommendation for patient to be transported by EMS to [facility] ED for admission and pacemaker implantation.  Patient was in agreement with presenting to the ED but insisted on driving himself despite risks outlined in detail.  He did present to the ED and was admitted.  He underwent pacemaker implantation on 09/02/2021.  In the latter part of 2023 patient suffered several  falls.  He had a left elbow fracture and closed humerus fracture in November and December respectively.  He was admitted to SNF for inpatient rehab. Patient was transitioned from warfarin to Eliquis  in early 2024.  Mid January 2024 he reported 1 episode of hematuria and Eliquis  was decreased to  2.5 mg twice daily.  Patient underwent evaluation in the ED for bleeding from his rectum and penis in February 2024.  He reported recent treatment for UTI with Foley placement several days prior.  He was also complaining of abdominal pain.  He was found to be constipated.  He had a Foley catheter placed.  He was discharged to follow-up with urology as an outpatient.  Patient was last seen in the office by Dr. Gollan on 05/09/2022 for routine follow-up.  He was doing well at that time.  He was maintaining sinus rhythm and was euvolemic.     History of Present Illness    Today, patient is accompanied by aide from SNF. He reports he is doing very well. Patient denies shortness of breath, dyspnea on exertion, lower extremity edema, orthopnea or PND. No chest pain, pressure, or tightness. No palpitations. He states he walks at the nursing home with a walker. Aide states he appears to be in wheelchair most of the time. Patient denies recent falls.     ROS: All other systems reviewed and are otherwise negative except as noted in History of Present Illness.  EKGs/Labs Reviewed    EKG Interpretation Date/Time:  Friday September 29 2023 11:05:39 EDT Ventricular Rate:  72 PR Interval:    QRS Duration:  140 QT Interval:  466 QTC Calculation: 510 R Axis:   -38  Text Interpretation: Ventricular-paced rhythm with occasional AV dual-paced complexes and with premature ventricular or aberrantly conducted complexes When compared with ECG of 04-Dec-2021 12:40, PREVIOUS ECG IS PRESENT Confirmed by Loistine Sober [id number]) on 09/29/2023 11:33:43 AM   05/08/2023: ALT 9; AST 12 05/18/2023: BUN 30; Creatinine 0.7; Potassium 4.1; Sodium 139   09/11/2023: Hemoglobin 13.2; WBC 7.4   05/08/2023: TSH 1.16    Risk Assessment/Calculations     CHA2DS2-VASc Score = 7   This indicates a 11.2% annual risk of stroke. The patient's score is based upon: CHF History: 1 HTN History: 1 Diabetes History: 1 Stroke History:  2 Vascular Disease History: 0 Age Score: 2 Gender Score: 0             Physical Exam    VS:  BP 114/60 (BP Location: Right Arm, Patient Position: Sitting, Cuff Size: Normal)   Pulse 72   Resp 18   Ht 5' 11 (1.803 m)   Wt 183 lb 12.8 oz (83.4 kg)   SpO2 98%   BMI 25.63 kg/m  , BMI Body mass index is 25.63 kg/m.  GEN: Well nourished, well developed, in no acute distress. Neck: No JVD or carotid bruits. Cardiac:  RRR.  No murmur. No rubs or gallops.   Respiratory:  Respirations regular and unlabored. Clear to auscultation without rales, wheezing or rhonchi. GI: Soft, nontender, nondistended. Extremities: Radials/DP/PT 2+ and equal bilaterally. No clubbing or cyanosis. No edema.  Skin: Warm and dry, no rash. Neuro: Strength intact.  Assessment & Plan   CAD Coronary calcium  score 1349 April 2017.  Low risk nuclear stress test April 2017.  Patient denies chest pain, pressure or tightness.  - Continue atorvastatin .  Not on aspirin secondary to Eliquis .  PAF S/p ablation 2014.  Denies spontaneous bleeding concerns.  Patient  denies palpitations. EKG V-paced with occasional AV dual paced complexes.  Patient has been on reduced dose Eliquis  since January 2024 secondary to history of frequent falls, hematuria, and GI bleeding. Discussed case with Dr. Gollan who recommends continued reduce dose Eliquis .  - Continue Eliquis .  Complete heart block S/p PPM August 2023.  Remote device check September 2025 demonstrated normal device function, battery and lead parameters stable with stable capture and sensing, appropriate device programming.  Patient has not been since by EP since pacemaker implant. EKG shows paced rhythm.  - Continue remote device checks. - Refer to EP.  Chronic HFpEF Echo August 2023 demonstrated normal LV function, Grade II DD, elevated LVEDP, normal RV size/function, mildly elevated PA pressure, severe LAE, mild RAE, mild MR.  Patient denies shortness of breath,  orthopnea or PND. Euvolemic and well compensated on exam. - GDMT limited secondary to frailty and hypotension. - No clinical indication for diuretic.  Hyperlipidemia LDL 30 November 2022, at goal. - Continue atorvastatin .  Disposition: Refer to EP. Follow up in 1 year or sooner as needed.          Signed, Barnie HERO. Laguana Desautel, DNP, NP-C

## 2023-09-29 ENCOUNTER — Encounter: Payer: Self-pay | Admitting: Student

## 2023-09-29 ENCOUNTER — Encounter: Payer: Self-pay | Admitting: Oncology

## 2023-09-29 ENCOUNTER — Ambulatory Visit: Attending: Student | Admitting: Student

## 2023-09-29 VITALS — BP 114/60 | HR 72 | Resp 18 | Ht 71.0 in | Wt 183.8 lb

## 2023-09-29 DIAGNOSIS — I480 Paroxysmal atrial fibrillation: Secondary | ICD-10-CM | POA: Diagnosis not present

## 2023-09-29 DIAGNOSIS — I2510 Atherosclerotic heart disease of native coronary artery without angina pectoris: Secondary | ICD-10-CM

## 2023-09-29 DIAGNOSIS — Z950 Presence of cardiac pacemaker: Secondary | ICD-10-CM | POA: Diagnosis not present

## 2023-09-29 DIAGNOSIS — I442 Atrioventricular block, complete: Secondary | ICD-10-CM

## 2023-09-29 DIAGNOSIS — I5032 Chronic diastolic (congestive) heart failure: Secondary | ICD-10-CM

## 2023-09-29 DIAGNOSIS — E785 Hyperlipidemia, unspecified: Secondary | ICD-10-CM | POA: Diagnosis not present

## 2023-09-29 NOTE — Patient Instructions (Signed)
 Medication Instructions:   Your physician recommends that you continue on your current medications as directed. Please refer to the Current Medication list given to you today.    *If you need a refill on your cardiac medications before your next appointment, please call your pharmacy*  Lab Work:  None ordered at this time   If you have labs (blood work) drawn today and your tests are completely normal, you will receive your results only by:  MyChart Message (if you have MyChart) OR  A paper copy in the mail If you have any lab test that is abnormal or we need to change your treatment, we will call you to review the results.  Testing/Procedures:  None ordered at this time   Referrals:  None ordered at this time   Follow-Up:  With EP provider next available  At [facility], you and your health needs are our priority.  As part of our continuing mission to provide you with exceptional heart care, our providers are all part of one team.  This team includes your primary Cardiologist (physician) and Advanced Practice Providers or APPs (Physician Assistants and Nurse Practitioners) who all work together to provide you with the care you need, when you need it.  Your next appointment:   1 year(s)  Provider:    You may see Timothy Gollan, MD or one of the following Advanced Practice Providers on your designated Care Team:   Lonni Meager, NP Lesley Maffucci, PA-C Bernardino Bring, PA-C Cadence Amherst, PA-C Tylene Lunch, NP Barnie Hila, NP    We recommend signing up for the patient portal called MyChart.  Sign up information is provided on this After Visit Summary.  MyChart is used to connect with patients for Virtual Visits (Telemedicine).  Patients are able to view lab/test results, encounter notes, upcoming appointments, etc.  Non-urgent messages can be sent to your provider as well.   To learn more about what you can do with MyChart, go to [email or web address].

## 2023-10-01 NOTE — Progress Notes (Deleted)
 Electrophysiology Clinic Note    Date:  10/01/2023  Patient ID:  Michael Colon, Michael Colon 08-Feb-1930, MRN [id number] PCP:  Abdul Fine, MD  Cardiologist:  Evalene Lunger, MD  Electrophysiologist:  OLE ONEIDA HOLTS, MD  Electrophysiology APP:  Kalii Chesmore, NP    ***refresh  Discussed the use of AI scribe software for clinical note transcription with the patient, who gave verbal consent to proceed.   Patient Profile    Chief Complaint: ***  History of Present Illness: Michael Colon is a 89 y.o. male with PMH notable for Aflutter, parox afib, CHB s/p PPM, non-obs CAD, HFpEF, HTN, HLD, TIA, T2DM, OSA; seen today for OLE ONEIDA HOLTS, MD for routine electrophysiology followup.   He is s/p PPM implant 09/2021 without EP follow-up afterwards. He has been monitored remotely.  He last saw NP Wittenborn last week where he was stable. On low-dose eliquis  for frequent falls, hematuria, GI bleeding.   On follow-up today, *** AF burden, symptoms *** palpitations *** bleeding concerns   Since last being seen in our clinic the patient reports doing ***.  he denies chest pain, palpitations, dyspnea, PND, orthopnea, nausea, vomiting, dizziness, syncope, edema, weight gain, or early satiety.      Arrhythmia/Device History Bos Sci dual chamber PPM, imp 08/2021; dx high-grade HB    ROS:  Please see the history of present illness. All other systems are reviewed and otherwise negative.    Physical Exam    VS:  There were no vitals taken for this visit. BMI: There is no height or weight on file to calculate BMI.           Wt Readings from Last 3 Encounters:  09/29/23 183 lb 12.8 oz (83.4 kg)  09/26/23 173 lb (78.5 kg)  08/24/23 171 lb 3.2 oz (77.7 kg)     GEN- The patient is well appearing, alert and oriented x 3 today.   Lungs- Clear to ausculation bilaterally, normal work of breathing.  Heart- {Blank single:19197::Regular,Irregularly irregular} rate and rhythm, no  murmurs, rubs or gallops Extremities- {EDEMA LEVEL:28147::No} peripheral edema, warm, dry Skin-  *** device pocket well-healed, no tethering  Device interrogation done today and reviewed by myself:  Battery *** Lead thresholds, impedence, sensing stable *** *** episodes *** changes made today   Studies Reviewed   Previous EP, cardiology notes.    EKG is not ordered. Personal review of EKG from 09/29/2023 shows: Intermittent AV pace        TTE, 09/02/2021  1. Left ventricular ejection fraction, by estimation, is 55 to 60%. Left  ventricular ejection fraction by 2D MOD biplane is 58.5 %. The left  ventricle has normal function. The left ventricle has no regional wall  motion abnormalities. There is mild concentric left ventricular hypertrophy. Left ventricular diastolic parameters are consistent with Grade II diastolic dysfunction (pseudonormalization). Elevated left ventricular end-diastolic pressure.   2. Right ventricular systolic function is normal. The right ventricular  size is normal. There is mildly elevated pulmonary artery systolic  pressure.   3. Left atrial size was severely dilated.   4. Right atrial size was mildly dilated.   5. The mitral valve is normal in structure. Mild mitral valve regurgitation. No evidence of mitral stenosis.   6. The aortic valve is tricuspid. There is mild calcification of the aortic valve. There is mild thickening of the aortic valve. Aortic valve  regurgitation is not visualized. No aortic stenosis is present.   7. Aortic dilatation noted.  There is mild dilatation of the aortic root,  measuring 40 mm. There is mild dilatation of the ascending aorta,  measuring 41 mm.   8. The inferior vena cava is dilated in size with <50% respiratory  variability, suggesting right atrial pressure of 15 mmHg.    Assessment and Plan     #) CHB s/p PPM   #) parox Afib   #) Hypercoag d/t parox afib CHA2DS2-VASc Score = at least 7 [CHF History: 1,  HTN History: 1, Diabetes History: 1, Stroke History: 2, Vascular Disease History: 0, Age Score: 2, Gender Score: 0].  Therefore, the patient's annual risk of stroke is 11.2 %.     {Confirm score is correct.  If not, click here to update score.  REFRESH note.  :1}   Stroke ppx - ***, appropriately dosed No bleeding concerns    #) ***   {Are you ordering a CV Procedure (e.g. stress test, cath, DCCV, TEE, etc)?   Press F2        :[id number]}   Current medicines are reviewed at length with the patient today.   The patient {ACTIONS; HAS/DOES NOT HAVE:19233} concerns regarding his medicines.  The following changes were made today:  {NONE DEFAULTED:18576}  Labs/ tests ordered today include: *** No orders of the defined types were placed in this encounter.    Disposition: Follow up with {EPMDS:28135::EP Team} or EP APP {EPFOLLOW UP:28173}   Signed, Chantal Needle, NP  10/01/23  3:49 PM  Electrophysiology [facility]

## 2023-10-02 ENCOUNTER — Ambulatory Visit: Admitting: Cardiology

## 2023-10-02 DIAGNOSIS — I5032 Chronic diastolic (congestive) heart failure: Secondary | ICD-10-CM

## 2023-10-02 DIAGNOSIS — I480 Paroxysmal atrial fibrillation: Secondary | ICD-10-CM

## 2023-10-02 DIAGNOSIS — I442 Atrioventricular block, complete: Secondary | ICD-10-CM

## 2023-10-19 NOTE — Progress Notes (Signed)
 Electrophysiology Clinic Note    Date:  10/20/2023  Patient ID:  Michael Colon, Michael Colon 27-Jun-1930, MRN [id number] PCP:  Abdul Fine, MD  Cardiologist:  Evalene Lunger, MD  Electrophysiologist:  CAMERON T LAMBERT, MD  Electrophysiology APP:  Darryl Blumenstein, NP     Discussed the use of AI scribe software for clinical note transcription with the patient, who gave verbal consent to proceed.   Patient Profile    Chief Complaint: PPM follow-up  History of Present Illness: Michael Colon is a 89 y.o. male with PMH notable for Aflutter, parox afib, CHB s/p PPM, non-obs CAD, HFpEF, HTN, HLD, TIA, T2DM, OSA; seen today for Michael ONEIDA HOLTS, MD for routine electrophysiology followup.   He is s/p PPM implant 09/2021 without EP follow-up afterwards. He has been monitored remotely.  He last saw NP Wittenborn last week where he was stable. On low-dose eliquis  for frequent falls, hematuria, GI bleeding.   On follow-up today, he has no current complaints. About a month ago, he experienced a hard thumping sensation in his chest, described as a wump, similar to being punched. It was very brief in duration, lasted a second or two, and has not recurred. He had never experienced it before.  He continues to take eliquis  BID, no bleeding concerns that he is aware of.   Staff from facility join for visit.       Arrhythmia/Device History Bos Sci dual chamber PPM, imp 08/2021; dx high-grade HB    ROS:  Please see the history of present illness. All other systems are reviewed and otherwise negative.    Physical Exam    VS:  BP 100/60 (BP Location: Right Arm, Patient Position: Sitting, Cuff Size: Normal)   Pulse 96   Ht 6' (1.829 m)   Wt 180 lb 9.6 oz (81.9 kg)   SpO2 99%   BMI 24.49 kg/m  BMI: Body mass index is 24.49 kg/m.           Wt Readings from Last 3 Encounters:  10/20/23 180 lb 9.6 oz (81.9 kg)  09/29/23 183 lb 12.8 oz (83.4 kg)  09/26/23 173 lb (78.5 kg)     GEN-  The patient is well appearing, alert and oriented x 3 today.   Lungs- Clear to ausculation bilaterally, normal work of breathing.  Heart- Regular rate and rhythm, no murmurs, rubs or gallops Extremities- No peripheral edema, warm, dry Skin-  device pocket well-healed, no tethering  Device interrogation done today and reviewed by myself:  Battery 8.5 years Lead thresholds, impedence, sensing stable  Dependent to VVI 40 VP 93% Low AFib burden <1% No recent episodes   Studies Reviewed   Previous EP, cardiology notes.    EKG is not ordered. Personal review of EKG from 09/29/2023 shows: Intermittent AV pace        TTE, 09/02/2021  1. Left ventricular ejection fraction, by estimation, is 55 to 60%. Left ventricular ejection fraction by 2D MOD biplane is 58.5 %. The left ventricle has normal function. The left ventricle has no regional wall motion abnormalities. There is mild concentric left ventricular hypertrophy. Left ventricular diastolic parameters are consistent with Grade II diastolic dysfunction (pseudonormalization). Elevated left ventricular end-diastolic pressure.   2. Right ventricular systolic function is normal. The right ventricular size is normal. There is mildly elevated pulmonary artery systolic pressure.   3. Left atrial size was severely dilated.   4. Right atrial size was mildly dilated.   5. The mitral  valve is normal in structure. Mild mitral valve regurgitation. No evidence of mitral stenosis.   6. The aortic valve is tricuspid. There is mild calcification of the aortic valve. There is mild thickening of the aortic valve. Aortic valve regurgitation is not visualized. No aortic stenosis is present.   7. Aortic dilatation noted. There is mild dilatation of the aortic root, measuring 40 mm. There is mild dilatation of the ascending aorta, measuring 41 mm.   8. The inferior vena cava is dilated in size with <50% respiratory variability, suggesting right atrial pressure of  15 mmHg.    Assessment and Plan     #) CHB s/p PPM Device functioning well, see paceart for details Dependent  High VP at 93%  #) parox Afib Low burden, asymptomatic  #) Hypercoag d/t parox afib CHA2DS2-VASc Score = at least 7 [CHF History: 1, HTN History: 1, Diabetes History: 1, Stroke History: 2, Vascular Disease History: 0, Age Score: 2, Gender Score: 0].  Therefore, the patient's annual risk of stroke is 11.2 %.    Stroke ppx - 2.5mg  eliquis  BID; dose lowered for age and frailty No bleeding concerns        Current medicines are reviewed at length with the patient today.   The patient does not have concerns regarding his medicines.  The following changes were made today:  none  Labs/ tests ordered today include:  No orders of the defined types were placed in this encounter.    Disposition: Follow up with Dr. Cindie or EP APP in 2 years , continue remote monitoring   Signed, Chantal Needle, NP  10/20/23  11:39 AM  Electrophysiology [facility]

## 2023-10-20 ENCOUNTER — Ambulatory Visit: Attending: Cardiology | Admitting: Cardiology

## 2023-10-20 VITALS — BP 100/60 | HR 96 | Ht 72.0 in | Wt 180.6 lb

## 2023-10-20 DIAGNOSIS — I480 Paroxysmal atrial fibrillation: Secondary | ICD-10-CM | POA: Diagnosis not present

## 2023-10-20 DIAGNOSIS — I442 Atrioventricular block, complete: Secondary | ICD-10-CM

## 2023-10-20 DIAGNOSIS — Z950 Presence of cardiac pacemaker: Secondary | ICD-10-CM

## 2023-10-20 LAB — CUP PACEART INCLINIC DEVICE CHECK
Date Time Interrogation Session: 20251017120956
Implantable Lead Connection Status: 753985
Implantable Lead Connection Status: 753985
Implantable Lead Implant Date: 20230831
Implantable Lead Implant Date: 20230831
Implantable Lead Location: 753859
Implantable Lead Location: 753860
Implantable Lead Model: 7841
Implantable Lead Model: 7842
Implantable Lead Serial Number: 1220926
Implantable Lead Serial Number: 1322603
Implantable Pulse Generator Implant Date: 20230831
Lead Channel Impedance Value: 514 Ohm
Lead Channel Impedance Value: 528 Ohm
Lead Channel Pacing Threshold Amplitude: 0.8 V
Lead Channel Pacing Threshold Amplitude: 1.1 V
Lead Channel Pacing Threshold Pulse Width: 0.4 ms
Lead Channel Pacing Threshold Pulse Width: 0.4 ms
Lead Channel Sensing Intrinsic Amplitude: 1.4 mV
Lead Channel Sensing Intrinsic Amplitude: 15 mV
Lead Channel Setting Pacing Amplitude: 3.5 V
Lead Channel Setting Pacing Amplitude: 3.5 V
Lead Channel Setting Pacing Pulse Width: 0.4 ms
Lead Channel Setting Sensing Sensitivity: 3.5 mV
Pulse Gen Serial Number: 613820
Zone Setting Status: 755011

## 2023-10-20 NOTE — Patient Instructions (Addendum)
 Medication Instructions:  Your physician recommends that you continue on your current medications as directed. Please refer to the Current Medication list given to you today.   *If you need a refill on your cardiac medications before your next appointment, please call your pharmacy*    Follow-Up: At [facility], you and your health needs are our priority.  As part of our continuing mission to provide you with exceptional heart care, our providers are all part of one team.  This team includes your primary Cardiologist (physician) and Advanced Practice Providers or APPs (Physician Assistants and Nurse Practitioners) who all work together to provide you with the care you need, when you need it.  Your next appointment:   2 year(s)  Provider:   Ole Holts, MD or Suzann Riddle, NP    We recommend signing up for the patient portal called MyChart.  Sign up information is provided on this After Visit Summary.  MyChart is used to connect with patients for Virtual Visits (Telemedicine).  Patients are able to view lab/test results, encounter notes, upcoming appointments, etc.  Non-urgent messages can be sent to your provider as well.   To learn more about what you can do with MyChart, go to [email or web address].

## 2023-10-23 ENCOUNTER — Non-Acute Institutional Stay (SKILLED_NURSING_FACILITY): Payer: Self-pay | Admitting: Internal Medicine

## 2023-10-23 ENCOUNTER — Ambulatory Visit: Payer: Self-pay | Admitting: Cardiology

## 2023-10-23 ENCOUNTER — Encounter: Payer: Self-pay | Admitting: Internal Medicine

## 2023-10-23 DIAGNOSIS — K5641 Fecal impaction: Secondary | ICD-10-CM

## 2023-10-23 NOTE — Progress Notes (Signed)
 [facility] Acute Care Progress Note    Location:  Other Nursing Home Room Number: 501 - [place] Place of Service:  SNF ([phone])   Abdul, [place], MD   Patient Care Team: Abdul Fine, MD as PCP - General (Family Medicine) Perla Evalene PARAS, MD as PCP - Cardiology (Cardiology) Cindie Ole DASEN, MD as PCP - Electrophysiology (Clinical Cardiac Electrophysiology) Perla Evalene PARAS, MD as Consulting Physician (Cardiology) Melanee Annah BROCKS, MD as Consulting Physician (Hematology and Oncology) Pa, [facility] (Optometry) Riddle, Suzann, NP as Nurse Practitioner (Clinical Cardiac Electrophysiology)   Extended Emergency Contact Information Primary Emergency Contact: Rocky [facility]) Address: [address]          [place], [place] [address] [place] Phone: [phone] Relation: Son   Goals of care: Advanced Directive information    08/03/2023    1:49 PM  Advanced Directives  Does Patient Have a Medical Advance Directive? Yes  Type of Estate agent of [place];Living will;Out of facility DNR (pink MOST or yellow form)  Does patient want to make changes to medical advance directive? No - Patient declined  Copy of Healthcare Power of Attorney in Chart? Yes - validated most recent copy scanned in chart (See row information)     CODE STATUS: Full Code Do Not Resuscitate (DNR)   Chief Complaint  Patient presents with   Constipation    Having difficulty having solid BM. Having liquid Bms.     HPI: Pt is a 89 y.o. male seen today for an acute visit for constipation. Per RN report, pt has been having liquid stools but complaining he is feeling constipated. Taking miralax  but has not had complete evacuation of his bowels. Having to strain to have Bms.     Past Medical History:  Diagnosis Date   Asthma    Atrial flutter (HCC) 2007   Band keratopathy    Benign prostatic hypertrophy    Chronic ear infection    Chronic  kidney disease    Chronic prostatitis    Dental bridge present    permanent - upper   Diabetes mellitus    Elbow stiffness, left    s/p fracture many yrs ago.  arm does not straighten   Encounter for anticoagulation discussion and counseling 07/21/2010   Fall 12/04/2021   GERD (gastroesophageal reflux disease)    laryngeal involvement   Hyperlipidemia    Hypertension    Obstructive sleep apnea    CPAP-9   Orchitis of right testicle 11/30/2020   Osteoarthrosis, localized, primary, knee    post-traumatic   Prostatitis    Sinus drainage 03/11/2022   Skin cancer    Stroke [facility])    TIA   Tachy-brady syndrome (HCC)    UTI (urinary tract infection)    Past Surgical History:  Procedure Laterality Date   APPENDECTOMY     BELPHAROPTOSIS REPAIR     Dr [name]---didn't resolve weepy eye and eyelid drooping   CARDIOVERSION  04/13/2010   CATARACT EXTRACTION, BILATERAL  2009   Chest pain  8/12   Stress test benign   ESOPHAGEAL DILATION  05/26/2016   Procedure: ESOPHAGEAL DILATION;  Surgeon: Jinny Carmine, MD;  [facility];  Service: Endoscopy;;   ESOPHAGOGASTRODUODENOSCOPY (EGD) WITH PROPOFOL  N/A 05/26/2016   Procedure: ESOPHAGOGASTRODUODENOSCOPY (EGD) WITH PROPOFOL ;  Surgeon: Jinny Carmine, MD;  Location: [facility];  Service: Endoscopy;  Laterality: N/A;  Diabetic - oral meds sleep apnea   EYE SURGERY  FRACTURE SURGERY Left    elbow   KNEE SURGERY  1998   plate after fracture, then removed for infection   left elbow surgery     MASTOIDECTOMY  8/08   Dr Rena   PACEMAKER IMPLANT N/A 09/02/2021   Procedure: PACEMAKER IMPLANT;  Surgeon: Cindie Ole DASEN, MD;  Location: [facility] INVASIVE CV LAB;  Service: Cardiovascular;  Laterality: N/A;   RHINOPLASTY  5/10   and septoplasty   SUBACROMIAL DECOMPRESSION Right 2005   Arthroscopic (for rotator cuff and biceps tendon ruptures)   TEAR DUCT PROBING  11/13   Dr Ashley   TOTAL KNEE ARTHROPLASTY Left 09/01/2014    Procedure: TOTAL KNEE ARTHROPLASTY;  Surgeon: Lynwood SHAUNNA Hue, MD;  Location: [facility] ORS;  Service: Orthopedics;  Laterality: Left;   TRIGGER FINGER RELEASE Left 02/25/2015   Procedure: LEFT LONG TRIGGER RELEASE;  Surgeon: Lynwood SHAUNNA Hue, MD;  Location: [facility] ORS;  Service: Orthopedics;  Laterality: Left;   VENOUS ABLATION       Allergies  Allergen Reactions   Lovenox [Enoxaparin] Hives   Proscar  [Finasteride ] Other (See Comments)    Upper body and arm weakness   Latex Rash    Has trouble with BANDAIDS that have been left on for more than 24 hours. Prefers paper tape.   Nickel Rash    Localized rash   Percocet [Oxycodone-Acetaminophen ] Rash   Tape Rash and Other (See Comments)    Sensitivity      Outpatient Encounter Medications as of 10/23/2023  Medication Sig   acetaminophen  (TYLENOL ) 500 MG tablet Take 1,000 mg by mouth every 8 (eight) hours as needed.   apixaban  (ELIQUIS ) 2.5 MG TABS tablet Take 2.5 mg by mouth 2 (two) times daily.   APPLE CIDER VINEGAR PO Take 1 capsule by mouth at bedtime.   atorvastatin  (LIPITOR ) 80 MG tablet Take 1 tablet (80 mg total) by mouth daily.   Cranberry 250 MG TABS Take 1 tablet by mouth 2 (two) times daily.   Cyanocobalamin  (B-12 PO) Take 1,000 mg by mouth daily.   FIBER PO Take 1 capsule by mouth daily.   finasteride  (PROSCAR ) 5 MG tablet Take 1 tablet (5 mg total) by mouth daily.   guaiFENesin  (MUCINEX ) 600 MG 12 hr tablet Take 600 mg by mouth every 8 (eight) hours as needed.   guaifenesin  (ROBITUSSIN) 100 MG/5ML syrup Take 10 mLs by mouth every 4 (four) hours as needed for cough.   IRON, FERROUS GLUCONATE, PO Take 65 mg by mouth daily.   lactose free nutrition (BOOST) LIQD Take 237 mLs by mouth 3 (three) times daily between meals.   lidocaine  (LIDODERM ) 5 % Place 1 patch onto the skin daily. Remove & Discard patch within 12 hours or as directed by MD   Magnesium  Oxide 400 MG CAPS Take 1 capsule (400 mg total) by mouth daily.   metFORMIN   (GLUCOPHAGE ) 500 MG tablet TAKE 1 TABLET BY MOUTH  TWICE DAILY WITH MEALS   mineral oil-hydrophilic petrolatum (AQUAPHOR) ointment Apply 1 Application topically daily as needed for dry skin. To left lower leg   Polyethyl Glycol-Propyl Glycol (SYSTANE ULTRA OP) Place 1 drop into both eyes daily as needed (dry eyes).   polyethylene glycol (MIRALAX  / GLYCOLAX ) 17 g packet Take 17 g by mouth every 12 (twelve) hours as needed. Daily every Monday, Wednesday and Friday.   saccharomyces boulardii (FLORASTOR) 250 MG capsule Take 250 mg by mouth daily.   sertraline (ZOLOFT) 50 MG tablet Take 50 mg by mouth daily.  sodium chloride  (OCEAN) 0.65 % SOLN nasal spray Place 2 sprays into both nostrils 2 (two) times daily as needed for congestion.   Sodium Fluoride (PREVIDENT 5000 BOOSTER PLUS) 1.1 % PSTE Place 1 application  onto teeth at bedtime as needed.   tamsulosin  (FLOMAX ) 0.4 MG CAPS capsule Take 2 capsules (0.8 mg total) by mouth at bedtime.   No facility-administered encounter medications on file as of 10/23/2023.     Review of Systems  Constitutional: Negative.   HENT: Negative.    Eyes: Negative.   Respiratory: Negative.    Cardiovascular: Negative.   Gastrointestinal:  Positive for blood in stool and constipation.  Endocrine: Negative.   Genitourinary: Negative.   Musculoskeletal: Negative.   Allergic/Immunologic: Negative.   Neurological: Negative.   Hematological: Negative.   Psychiatric/Behavioral: Negative.    All other systems reviewed and are negative.    Immunization History  Administered Date(s) Administered   Fluad Quad(high Dose 65+) 09/14/2018   H1N1 12/13/2007   INFLUENZA, HIGH DOSE SEASONAL PF 09/20/2012, 09/07/2017   Influenza Split 09/11/2010   Influenza Whole 09/12/2011   Influenza, Seasonal, Injecte, Preservative Fre 09/08/2007, 09/26/2008, 10/02/2014, 09/18/2015   Influenza,inj,Quad PF,6+ Mos 09/19/2013, 09/21/2015   Influenza-Unspecified 09/07/2017, 09/11/2019,  09/03/2020, 10/19/2021, 10/26/2022   Moderna Sars-Covid-2 Vaccination 01/18/2019, 02/15/2019, 11/19/2019, 10/17/2020, 06/01/2021   PNEUMOCOCCAL CONJUGATE-20 12/13/2021   Pneumococcal Conjugate-13 09/19/2013   Pneumococcal Polysaccharide-23 09/26/2003, 06/28/2016   Td 08/04/1998   Tdap 06/04/2010, 08/15/2021   Tetanus 08/04/1998   Unspecified SARS-COV-2 Vaccination 06/01/2021, 09/30/2022, 04/14/2023   Zoster Recombinant(Shingrix) 06/29/2022   Zoster, Live 01/16/2004   Pertinent  Health Maintenance Due  Topic Date Due   Influenza Vaccine  08/04/2023   HEMOGLOBIN A1C  11/08/2023   FOOT EXAM  02/08/2024   OPHTHALMOLOGY EXAM  06/19/2024      12/06/2021    7:05 AM 12/06/2021    7:40 PM 12/07/2021    8:45 AM 06/28/2022    2:03 PM 08/24/2023    9:14 AM  Fall Risk  Falls in the past year?    1 1  Was there an injury with Fall?    1 0  Fall Risk Category Calculator    3 2  (RETIRED) Patient Fall Risk Level High fall risk  High fall risk  High fall risk     Patient at Risk for Falls Due to    History of fall(s);Impaired balance/gait;Impaired mobility;Impaired vision History of fall(s);Impaired balance/gait;Impaired mobility     Data saved with a previous flowsheet row definition   Functional Status Survey:     Vitals:   10/18/23 2236  BP: (!) 148/83  Pulse: 67  Resp: 18  SpO2: 97%  Weight: 173 lb 9.6 oz (78.7 kg)   Body mass index is 24.21 kg/m. Physical Exam Vitals and nursing note reviewed.  HENT:     Head: Normocephalic and atraumatic.  Pulmonary:     Effort: Pulmonary effort is normal. No respiratory distress.  Abdominal:     Comments: Rectal exam reveals thick, clay-like consistency gray colored feces. About 4 ounces of stool digitally removed from his rectum. There is still quite a lot more higher up in his rectum. Rectal tone is poor. Min/moderate amount of blood with digital feces disimpaction.  Skin:    General: Skin is warm and dry.  Neurological:     Mental  Status: He is alert.      Labs reviewed: Recent Labs    11/07/22 0000 05/08/23 0000 05/18/23 0000  NA 138 138  139  K 4.0 4.2 4.1  CL 104 105 105  CO2 29* 30* 27*  BUN 38* 26* 30*  CREATININE 0.9 0.8 0.7  CALCIUM   --  9.9 10.0   Recent Labs    11/07/22 0000 05/08/23 0000  AST 13* 12*  ALT 13 9*  ALKPHOS 63 64  ALBUMIN 3.4* 3.2*   Recent Labs    05/08/23 0000 05/15/23 0000 05/23/23 1154 06/05/23 0000 07/11/23 1053 08/24/23 0000 09/11/23 0000  WBC 4.1   < > 5.1   < > 5.5 5.0 7.4  NEUTROABS 1,915.00  --   --   --   --  2,560.00  --   HGB 6.9*   < > 7.7*   < > 11.0* 10.8* 13.2*  HCT 24*   < > 25.9*   < > 35.7* 34* 42  MCV  --   --  86.3  --  93.5  --   --   PLT 98*   < > 177   < > 125* 103* 103*   < > = values in this interval not displayed.   Lab Results  Component Value Date   TSH 1.16 05/08/2023   Lab Results  Component Value Date   HGBA1C 6.9 05/08/2023   Lab Results  Component Value Date   CHOL 95 11/07/2022   HDL 59 11/07/2022   LDLCALC 27 11/07/2022   TRIG 31 (A) 11/07/2022   CHOLHDL 2 02/25/2021     Significant Diagnostic Results in last 30 days: CUP PACEART INCLINIC DEVICE CHECK Result Date: 10/20/2023 Normal in-clinic _dual__ chamber pacemaker check. Presenting Rhythm: _AS/AP - VP__ . Routine testing of thresholds, sensing, and impedance demonstrate stable parameters and no programming changes needed at this time. No recent episodes. Estimated longevity __8.5 years__ . Pt enrolled in remote follow-up. CANDIE Needle, NP    Assessment & Plan Fecal impaction in rectum [facility]) Likely pt have overflow fecal incontinence as the explanation for his liquid stools. Pt with hard and clay-like consistency stool in his rectum. This as only partially physically removed with the help of Scarlett, RN at bedside. Will increase miralax  to 17 grams daily and add prn soap suds enema.       Camellia Door, DO [facility] [phone]

## 2023-10-24 ENCOUNTER — Non-Acute Institutional Stay (SKILLED_NURSING_FACILITY): Payer: Self-pay | Admitting: Internal Medicine

## 2023-10-24 ENCOUNTER — Encounter: Payer: Self-pay | Admitting: Internal Medicine

## 2023-10-24 DIAGNOSIS — K5641 Fecal impaction: Secondary | ICD-10-CM | POA: Insufficient documentation

## 2023-10-24 HISTORY — DX: Fecal impaction: K56.41

## 2023-10-24 NOTE — Assessment & Plan Note (Addendum)
 Day #2 of manual fecal disimpaction.  Patient still has lots of stool embedded in his rectum that is high up unreachable by 2 fingers.  Start on enemas every 2 hours to help.  He may need another day of manual disimpaction.

## 2023-10-24 NOTE — Progress Notes (Signed)
 [facility] SN[facility] Acute Care Progress Note    Location:  Other [place].  Nursing Home Room Number: [facility] [id number] Place of Service:  SNF (31)   PCP: Laurence Locus, DO   Patient Care Team: Laurence Locus, DO as PCP - General (Internal Medicine) Perla Evalene PARAS, MD as PCP - Cardiology (Cardiology) Cindie Ole DASEN, MD as PCP - Electrophysiology (Clinical Cardiac Electrophysiology) Perla Evalene PARAS, MD as Consulting Physician (Cardiology) Melanee Annah BROCKS, MD as Consulting Physician (Hematology and Oncology) Pa, [facility] (Optometry) Riddle, Suzann, NP as Nurse Practitioner (Clinical Cardiac Electrophysiology)   Extended Emergency Contact Information Primary Emergency Contact: [name] Address: [address]          [place], [place] [address] [place] Phone: [phone] Relation: Son   Goals of care: Advanced Directive information    10/24/2023    2:34 PM  Advanced Directives  Does Patient Have a Medical Advance Directive? Yes  Type of Estate agent of [place];Living will;Out of facility DNR (pink MOST or yellow form)  Does patient want to make changes to medical advance directive? No - Patient declined  Copy of Healthcare Power of Attorney in Chart? Yes - validated most recent copy scanned in chart (See row information)     CODE STATUS: Do Not Resuscitate (DNR)   Chief Complaint  Patient presents with   Fecal Impaction    Fecal Impaction.      HPI: Pt is a 89 y.o. male seen today for an acute visit for Fecal Impaction. Patient still having constipation.  Daytime RN noted that he had a solid stool during the night shift.  Patient still feeling quite full in his rectum.     Past Medical History:  Diagnosis Date   Asthma    Atrial flutter (HCC) 2007   Band keratopathy    Benign prostatic hypertrophy    Chronic ear infection    Chronic kidney disease    Chronic prostatitis    Dental bridge  present    permanent - upper   Diabetes mellitus    Elbow stiffness, left    s/p fracture many yrs ago.  arm does not straighten   Encounter for anticoagulation discussion and counseling 07/21/2010   Fall 12/04/2021   GERD (gastroesophageal reflux disease)    laryngeal involvement   Hyperlipidemia    Hypertension    Obstructive sleep apnea    CPAP-9   Orchitis of right testicle 11/30/2020   Osteoarthrosis, localized, primary, knee    post-traumatic   Prostatitis    Sinus drainage 03/11/2022   Skin cancer    Stroke [facility])    TIA   Tachy-brady syndrome (HCC)    UTI (urinary tract infection)    Past Surgical History:  Procedure Laterality Date   APPENDECTOMY     BELPHAROPTOSIS REPAIR     Dr [name]---didn't resolve weepy eye and eyelid drooping   CARDIOVERSION  04/13/2010   CATARACT EXTRACTION, BILATERAL  2009   Chest pain  8/12   Stress test benign   ESOPHAGEAL DILATION  05/26/2016   Procedure: ESOPHAGEAL DILATION;  Surgeon: Jinny Carmine, MD;  Location: [facility];  Service: Endoscopy;;   ESOPHAGOGASTRODUODENOSCOPY (EGD) WITH PROPOFOL  N/A 05/26/2016   Procedure: ESOPHAGOGASTRODUODENOSCOPY (EGD) WITH PROPOFOL ;  Surgeon: Jinny Carmine, MD;  Location: [facility];  Service: Endoscopy;  Laterality: N/A;  Diabetic - oral meds sleep apnea   EYE SURGERY     FRACTURE SURGERY Left  elbow   KNEE SURGERY  1998   plate after fracture, then removed for infection   left elbow surgery     MASTOIDECTOMY  8/08   Dr Rena   PACEMAKER IMPLANT N/A 09/02/2021   Procedure: PACEMAKER IMPLANT;  Surgeon: Cindie Ole DASEN, MD;  Location: [facility] INVASIVE CV LAB;  Service: Cardiovascular;  Laterality: N/A;   RHINOPLASTY  5/10   and septoplasty   SUBACROMIAL DECOMPRESSION Right 2005   Arthroscopic (for rotator cuff and biceps tendon ruptures)   TEAR DUCT PROBING  11/13   Dr Ashley   TOTAL KNEE ARTHROPLASTY Left 09/01/2014   Procedure: TOTAL KNEE ARTHROPLASTY;  Surgeon: Lynwood SHAUNNA Hue, MD;  Location: [facility] ORS;  Service: Orthopedics;  Laterality: Left;   TRIGGER FINGER RELEASE Left 02/25/2015   Procedure: LEFT LONG TRIGGER RELEASE;  Surgeon: Lynwood SHAUNNA Hue, MD;  Location: [facility] ORS;  Service: Orthopedics;  Laterality: Left;   VENOUS ABLATION       Allergies  Allergen Reactions   Lovenox [Enoxaparin] Hives   Proscar  [Finasteride ] Other (See Comments)    Upper body and arm weakness   Latex Rash    Has trouble with BANDAIDS that have been left on for more than 24 hours. Prefers paper tape.   Nickel Rash    Localized rash   Percocet Gallicus.Gardner ] Rash   Tape Rash and Other (See Comments)    Sensitivity      Outpatient Encounter Medications as of 10/24/2023  Medication Sig   acetaminophen  (TYLENOL ) 500 MG tablet Take 1,000 mg by mouth every 8 (eight) hours as needed.   apixaban  (ELIQUIS ) 2.5 MG TABS tablet Take 2.5 mg by mouth 2 (two) times daily.   APPLE CIDER VINEGAR PO Take 1 capsule by mouth at bedtime.   atorvastatin  (LIPITOR ) 80 MG tablet Take 1 tablet (80 mg total) by mouth daily.   Cranberry 250 MG TABS Take 1 tablet by mouth 2 (two) times daily.   Cyanocobalamin  (B-12 PO) Take 1,000 mg by mouth daily.   docusate sodium  (COLACE) 100 MG capsule Take 100 mg by mouth 2 (two) times daily.   FIBER PO Take 1 capsule by mouth daily.   finasteride  (PROSCAR ) 5 MG tablet Take 1 tablet (5 mg total) by mouth daily.   guaiFENesin  (MUCINEX ) 600 MG 12 hr tablet Take 600 mg by mouth every 8 (eight) hours as needed.   guaifenesin  (ROBITUSSIN) 100 MG/5ML syrup Take 10 mLs by mouth every 4 (four) hours as needed for cough.   IRON, FERROUS GLUCONATE, PO Take 65 mg by mouth daily.   lactose free nutrition (BOOST) LIQD Take 237 mLs by mouth 3 (three) times daily between meals.   lidocaine  (LIDODERM ) 5 % Place 1 patch onto the skin daily. Remove & Discard patch within 12 hours or as directed by MD   Magnesium  Oxide 400 MG CAPS Take 1 capsule (400 mg total) by mouth  daily.   metFORMIN  (GLUCOPHAGE ) 500 MG tablet TAKE 1 TABLET BY MOUTH  TWICE DAILY WITH MEALS   mineral oil-hydrophilic petrolatum (AQUAPHOR) ointment Apply 1 Application topically daily as needed for dry skin. To left lower leg   Polyethyl Glycol-Propyl Glycol (SYSTANE ULTRA OP) Place 1 drop into both eyes daily as needed (dry eyes).   polyethylene glycol (MIRALAX  / GLYCOLAX ) 17 g packet Take 17 g by mouth every 12 (twelve) hours as needed. Daily every Monday, Wednesday and Friday.   saccharomyces boulardii (FLORASTOR) 250 MG capsule Take 250 mg by mouth daily.  sertraline (ZOLOFT) 50 MG tablet Take 50 mg by mouth daily.   sodium chloride  (OCEAN) 0.65 % SOLN nasal spray Place 2 sprays into both nostrils 2 (two) times daily as needed for congestion.   Sodium Fluoride (PREVIDENT 5000 BOOSTER PLUS) 1.1 % PSTE Place 1 application  onto teeth at bedtime as needed.   tamsulosin  (FLOMAX ) 0.4 MG CAPS capsule Take 2 capsules (0.8 mg total) by mouth at bedtime.   No facility-administered encounter medications on file as of 10/24/2023.     Review of Systems  Gastrointestinal:  Positive for constipation.  All other systems reviewed and are negative.    Immunization History  Administered Date(s) Administered   Fluad Quad(high Dose 65+) 09/14/2018   H1N1 12/13/2007   INFLUENZA, HIGH DOSE SEASONAL PF 09/20/2012, 09/07/2017   Influenza Split 09/11/2010   Influenza Whole 09/12/2011   Influenza, Seasonal, Injecte, Preservative Fre 09/08/2007, 09/26/2008, 10/02/2014, 09/18/2015   Influenza,inj,Quad PF,6+ Mos 09/19/2013, 09/21/2015   Influenza-Unspecified 09/07/2017, 09/11/2019, 09/03/2020, 10/19/2021, 10/26/2022, 10/17/2023   Moderna Sars-Covid-2 Vaccination 01/18/2019, 02/15/2019, 11/19/2019, 10/17/2020, 06/01/2021   PNEUMOCOCCAL CONJUGATE-20 12/13/2021   Pneumococcal Conjugate-13 09/19/2013   Pneumococcal Polysaccharide-23 09/26/2003, 06/28/2016   Td 08/04/1998   Tdap 06/04/2010, 08/15/2021    Tetanus 08/04/1998   Unspecified SARS-COV-2 Vaccination 06/01/2021, 09/30/2022, 04/14/2023   Zoster Recombinant(Shingrix) 06/29/2022   Zoster, Live 01/16/2004   Pertinent  Health Maintenance Due  Topic Date Due   HEMOGLOBIN A1C  11/08/2023   FOOT EXAM  02/08/2024   OPHTHALMOLOGY EXAM  06/19/2024   Influenza Vaccine  Completed      12/06/2021    7:05 AM 12/06/2021    7:40 PM 12/07/2021    8:45 AM 06/28/2022    2:03 PM 08/24/2023    9:14 AM  Fall Risk  Falls in the past year?    1 1  Was there an injury with Fall?    1 0  Fall Risk Category Calculator    3 2  (RETIRED) Patient Fall Risk Level High fall risk  High fall risk  High fall risk     Patient at Risk for Falls Due to    History of fall(s);Impaired balance/gait;Impaired mobility;Impaired vision History of fall(s);Impaired balance/gait;Impaired mobility     Data saved with a previous flowsheet row definition   Functional Status Survey:     Vitals:   10/24/23 1421 10/24/23 1451  BP: (!) 148/83 116/70  Pulse: 67   Resp: 18   Temp: 97.6 F (36.4 C)   SpO2: 97%   Weight: 173 lb 9.6 oz (78.7 kg)   Height: 6' (1.829 m)    Body mass index is 23.54 kg/m. Physical Exam Vitals and nursing note reviewed.  Constitutional:      Comments: Elderly male.  HENT:     Head: Normocephalic and atraumatic.  Eyes:     General: No scleral icterus. Pulmonary:     Effort: Pulmonary effort is normal. No respiratory distress.  Abdominal:     Comments: Digital rectal examination formed again.  Patient still has claylike stool in his rectum.  I was able to insert 2 fingers (index and middle finger) with sufficient lubrication.  He still had copious amounts of claylike stool high up past my fingertips.  Was able to extract probably another 4 ounces of stool.  He will need further manual disimpaction tomorrow.  Neurological:     Mental Status: He is alert and oriented to person, place, and time.      Labs reviewed: Recent Labs  11/07/22 0000 05/08/23 0000 05/18/23 0000  NA 138 138 139  K 4.0 4.2 4.1  CL 104 105 105  CO2 29* 30* 27*  BUN 38* 26* 30*  CREATININE 0.9 0.8 0.7  CALCIUM   --  9.9 10.0   Recent Labs    11/07/22 0000 05/08/23 0000  AST 13* 12*  ALT 13 9*  ALKPHOS 63 64  ALBUMIN 3.4* 3.2*   Recent Labs    05/08/23 0000 05/15/23 0000 05/23/23 1154 06/05/23 0000 07/11/23 1053 08/24/23 0000 09/11/23 0000  WBC 4.1   < > 5.1   < > 5.5 5.0 7.4  NEUTROABS 1,915.00  --   --   --   --  2,560.00  --   HGB 6.9*   < > 7.7*   < > 11.0* 10.8* 13.2*  HCT 24*   < > 25.9*   < > 35.7* 34* 42  MCV  --   --  86.3  --  93.5  --   --   PLT 98*   < > 177   < > 125* 103* 103*   < > = values in this interval not displayed.   Lab Results  Component Value Date   TSH 1.16 05/08/2023   Lab Results  Component Value Date   HGBA1C 6.9 05/08/2023   Lab Results  Component Value Date   CHOL 95 11/07/2022   HDL 59 11/07/2022   LDLCALC 27 11/07/2022   TRIG 31 (A) 11/07/2022   CHOLHDL 2 02/25/2021     Significant Diagnostic Results in last 30 days: CUP PACEART INCLINIC DEVICE CHECK Result Date: 10/20/2023 Normal in-clinic _dual__ chamber pacemaker check. Presenting Rhythm: _AS/AP - VP__ . Routine testing of thresholds, sensing, and impedance demonstrate stable parameters and no programming changes needed at this time. No recent episodes. Estimated longevity __8.5 years__ . Pt enrolled in remote follow-up. CANDIE Needle, NP    Assessment & Plan Fecal impaction in rectum (HCC) Day #2 of manual fecal disimpaction.  Patient still has lots of stool embedded in his rectum that is high up unreachable by 2 fingers.  Start on enemas every 2 hours to help.  He may need another day of manual disimpaction.        Camellia Door, DO [facility] & Adult Medicine [phone]

## 2023-10-25 ENCOUNTER — Non-Acute Institutional Stay (SKILLED_NURSING_FACILITY): Payer: Self-pay | Admitting: Internal Medicine

## 2023-10-25 ENCOUNTER — Encounter: Payer: Self-pay | Admitting: Internal Medicine

## 2023-10-25 DIAGNOSIS — K5641 Fecal impaction: Secondary | ICD-10-CM

## 2023-10-25 NOTE — Progress Notes (Signed)
 [facility] SNF Acute Care Progress Note    Location:  Other [place].  Nursing Home Room Number: [facility] [id number] Place of Service:  SNF (31)   PCP: Laurence Locus, DO   Patient Care Team: Laurence Locus, DO as PCP - General (Internal Medicine) Perla Evalene PARAS, MD as PCP - Cardiology (Cardiology) Cindie Ole DASEN, MD as PCP - Electrophysiology (Clinical Cardiac Electrophysiology) Perla Evalene PARAS, MD as Consulting Physician (Cardiology) Melanee Annah BROCKS, MD as Consulting Physician (Hematology and Oncology) Pa, [facility] (Optometry) Riddle, Suzann, NP as Nurse Practitioner (Clinical Cardiac Electrophysiology)   Extended Emergency Contact Information Primary Emergency Contact: Rocky Alm Cart [facility]) Address: [address]          [place], [place] [address] [place] Phone: [phone] Relation: Son   Goals of care: Advanced Directive information    10/24/2023    2:34 PM  Advanced Directives  Does Patient Have a Medical Advance Directive? Yes  Type of Estate agent of [place];Living will;Out of facility DNR (pink MOST or yellow form)  Does patient want to make changes to medical advance directive? No - Patient declined  Copy of Healthcare Power of Attorney in Chart? Yes - validated most recent copy scanned in chart (See row information)     CODE STATUS: Do Not Resuscitate (DNR)   Chief Complaint  Patient presents with   Fecal Impaction    Fecal Impaction.      HPI: Pt is a 89 y.o. male seen today for an acute visit for Fecal Impaction.      Past Medical History:  Diagnosis Date   Asthma    Atrial flutter (HCC) 2007   Band keratopathy    Benign prostatic hypertrophy    Chronic ear infection    Chronic kidney disease    Chronic prostatitis    Dental bridge present    permanent - upper   Diabetes mellitus    Elbow stiffness, left    s/p fracture many yrs ago.  arm does not straighten   Encounter for  anticoagulation discussion and counseling 07/21/2010   Fall 12/04/2021   GERD (gastroesophageal reflux disease)    laryngeal involvement   Hyperlipidemia    Hypertension    Obstructive sleep apnea    CPAP-9   Orchitis of right testicle 11/30/2020   Osteoarthrosis, localized, primary, knee    post-traumatic   Prostatitis    Sinus drainage 03/11/2022   Skin cancer    Stroke [facility])    TIA   Tachy-brady syndrome (HCC)    UTI (urinary tract infection)    Past Surgical History:  Procedure Laterality Date   APPENDECTOMY     BELPHAROPTOSIS REPAIR     Dr [name]---didn't resolve weepy eye and eyelid drooping   CARDIOVERSION  04/13/2010   CATARACT EXTRACTION, BILATERAL  2009   Chest pain  8/12   Stress test benign   ESOPHAGEAL DILATION  05/26/2016   Procedure: ESOPHAGEAL DILATION;  Surgeon: Jinny Carmine, MD;  Location: [facility];  Service: Endoscopy;;   ESOPHAGOGASTRODUODENOSCOPY (EGD) WITH PROPOFOL  N/A 05/26/2016   Procedure: ESOPHAGOGASTRODUODENOSCOPY (EGD) WITH PROPOFOL ;  Surgeon: Jinny Carmine, MD;  Location: [facility];  Service: Endoscopy;  Laterality: N/A;  Diabetic - oral meds sleep apnea   EYE SURGERY     FRACTURE SURGERY Left    elbow   KNEE SURGERY  1998   plate after fracture, then removed for infection   left elbow surgery  MASTOIDECTOMY  8/08   Dr Rena   PACEMAKER IMPLANT N/A 09/02/2021   Procedure: PACEMAKER IMPLANT;  Surgeon: Cindie Ole DASEN, MD;  Location: [facility] INVASIVE CV LAB;  Service: Cardiovascular;  Laterality: N/A;   RHINOPLASTY  5/10   and septoplasty   SUBACROMIAL DECOMPRESSION Right 2005   Arthroscopic (for rotator cuff and biceps tendon ruptures)   TEAR DUCT PROBING  11/13   Dr Ashley   TOTAL KNEE ARTHROPLASTY Left 09/01/2014   Procedure: TOTAL KNEE ARTHROPLASTY;  Surgeon: Lynwood SHAUNNA Hue, MD;  Location: [facility] ORS;  Service: Orthopedics;  Laterality: Left;   TRIGGER FINGER RELEASE Left 02/25/2015   Procedure: LEFT LONG TRIGGER  RELEASE;  Surgeon: Lynwood SHAUNNA Hue, MD;  Location: [facility] ORS;  Service: Orthopedics;  Laterality: Left;   VENOUS ABLATION       Allergies  Allergen Reactions   Lovenox [Enoxaparin] Hives   Proscar  [Finasteride ] Other (See Comments)    Upper body and arm weakness   Latex Rash    Has trouble with BANDAIDS that have been left on for more than 24 hours. Prefers paper tape.   Nickel Rash    Localized rash   Percocet [Oxycodone-Acetaminophen ] Rash   Tape Rash and Other (See Comments)    Sensitivity      Outpatient Encounter Medications as of 10/25/2023  Medication Sig   acetaminophen  (TYLENOL ) 500 MG tablet Take 1,000 mg by mouth every 8 (eight) hours as needed.   apixaban  (ELIQUIS ) 2.5 MG TABS tablet Take 2.5 mg by mouth 2 (two) times daily.   APPLE CIDER VINEGAR PO Take 1 capsule by mouth at bedtime.   atorvastatin  (LIPITOR ) 80 MG tablet Take 1 tablet (80 mg total) by mouth daily.   Cranberry 250 MG TABS Take 1 tablet by mouth 2 (two) times daily.   Cyanocobalamin  (B-12 PO) Take 1,000 mg by mouth daily.   docusate sodium  (COLACE) 100 MG capsule Take 100 mg by mouth 2 (two) times daily.   finasteride  (PROSCAR ) 5 MG tablet Take 1 tablet (5 mg total) by mouth daily.   guaiFENesin  (MUCINEX ) 600 MG 12 hr tablet Take 600 mg by mouth every 8 (eight) hours as needed.   guaifenesin  (ROBITUSSIN) 100 MG/5ML syrup Take 10 mLs by mouth every 4 (four) hours as needed for cough.   IRON, FERROUS GLUCONATE, PO Take 65 mg by mouth daily.   lactose free nutrition (BOOST) LIQD Take 237 mLs by mouth 3 (three) times daily between meals.   lidocaine  (LIDODERM ) 5 % Place 1 patch onto the skin daily. Remove & Discard patch within 12 hours or as directed by MD   Magnesium  Oxide 400 MG CAPS Take 1 capsule (400 mg total) by mouth daily.   metFORMIN  (GLUCOPHAGE ) 500 MG tablet TAKE 1 TABLET BY MOUTH  TWICE DAILY WITH MEALS   mineral oil-hydrophilic petrolatum (AQUAPHOR) ointment Apply 1 Application topically daily as  needed for dry skin. To left lower leg   Polyethyl Glycol-Propyl Glycol (SYSTANE ULTRA OP) Place 1 drop into both eyes daily as needed (dry eyes).   polyethylene glycol (MIRALAX  / GLYCOLAX ) 17 g packet Take 17 g by mouth daily.   saccharomyces boulardii (FLORASTOR) 250 MG capsule Take 250 mg by mouth daily.   sertraline (ZOLOFT) 50 MG tablet Take 50 mg by mouth daily.   sodium chloride  (OCEAN) 0.65 % SOLN nasal spray Place 2 sprays into both nostrils 2 (two) times daily as needed for congestion.   Sodium Fluoride (PREVIDENT 5000 BOOSTER PLUS) 1.1 % PSTE  Place 1 application  onto teeth at bedtime as needed.   tamsulosin  (FLOMAX ) 0.4 MG CAPS capsule Take 2 capsules (0.8 mg total) by mouth at bedtime.   [DISCONTINUED] FIBER PO Take 1 capsule by mouth daily. (Patient not taking: Reported on 10/25/2023)   No facility-administered encounter medications on file as of 10/25/2023.     Review of Systems  Gastrointestinal:  Negative for blood in stool, nausea and rectal pain.  All other systems reviewed and are negative.    Immunization History  Administered Date(s) Administered   Fluad Quad(high Dose 65+) 09/14/2018   H1N1 12/13/2007   INFLUENZA, HIGH DOSE SEASONAL PF 09/20/2012, 09/07/2017   Influenza Split 09/11/2010   Influenza Whole 09/12/2011   Influenza, Seasonal, Injecte, Preservative Fre 09/08/2007, 09/26/2008, 10/02/2014, 09/18/2015   Influenza,inj,Quad PF,6+ Mos 09/19/2013, 09/21/2015   Influenza-Unspecified 09/07/2017, 09/11/2019, 09/03/2020, 10/19/2021, 10/26/2022, 10/17/2023, 10/17/2023   Moderna Sars-Covid-2 Vaccination 01/18/2019, 02/15/2019, 11/19/2019, 10/17/2020, 06/01/2021   PNEUMOCOCCAL CONJUGATE-20 12/13/2021   Pneumococcal Conjugate-13 09/19/2013   Pneumococcal Polysaccharide-23 09/26/2003, 06/28/2016   Td 08/04/1998   Tdap 06/04/2010, 08/15/2021   Tetanus 08/04/1998   Unspecified SARS-COV-2 Vaccination 06/01/2021, 09/30/2022, 04/14/2023   Zoster Recombinant(Shingrix)  06/29/2022   Zoster, Live 01/16/2004   Pertinent  Health Maintenance Due  Topic Date Due   HEMOGLOBIN A1C  11/08/2023   FOOT EXAM  02/08/2024   OPHTHALMOLOGY EXAM  06/19/2024   Influenza Vaccine  Completed      12/06/2021    7:05 AM 12/06/2021    7:40 PM 12/07/2021    8:45 AM 06/28/2022    2:03 PM 08/24/2023    9:14 AM  Fall Risk  Falls in the past year?    1 1  Was there an injury with Fall?    1 0  Fall Risk Category Calculator    3 2  (RETIRED) Patient Fall Risk Level High fall risk  High fall risk  High fall risk     Patient at Risk for Falls Due to    History of fall(s);Impaired balance/gait;Impaired mobility;Impaired vision History of fall(s);Impaired balance/gait;Impaired mobility     Data saved with a previous flowsheet row definition   Functional Status Survey:     Vitals:   10/25/23 1101  BP: (!) 148/83  Pulse: 67  Resp: 18  Temp: 97.6 F (36.4 C)  SpO2: 97%  Weight: 173 lb 9.6 oz (78.7 kg)  Height: 6' (1.829 m)   Body mass index is 23.54 kg/m. Physical Exam Vitals and nursing note reviewed.  Constitutional:      General: He is not in acute distress.    Appearance: He is not toxic-appearing.  Eyes:     General: No scleral icterus. Pulmonary:     Effort: Pulmonary effort is normal. No respiratory distress.  Abdominal:     General: There is no distension.     Palpations: Abdomen is soft.     Tenderness: There is no abdominal tenderness.     Comments: Lax rectal tone. Digital rectal exam performed with chaperone present.  There was no stool in the rectal vault today.  No blood in the rectal vault.    Abdomen was soft and nontender.  Skin:    General: Skin is warm and dry.  Neurological:     Mental Status: He is alert and oriented to person, place, and time.     Comments: Able to self propel in wheelchair from common area [place] back to his room. He was then able to stand  on his own and lie down in bed by himself.      Labs  reviewed: Recent Labs    11/07/22 0000 05/08/23 0000 05/18/23 0000  NA 138 138 139  K 4.0 4.2 4.1  CL 104 105 105  CO2 29* 30* 27*  BUN 38* 26* 30*  CREATININE 0.9 0.8 0.7  CALCIUM   --  9.9 10.0   Recent Labs    11/07/22 0000 05/08/23 0000  AST 13* 12*  ALT 13 9*  ALKPHOS 63 64  ALBUMIN 3.4* 3.2*   Recent Labs    05/08/23 0000 05/15/23 0000 05/23/23 1154 06/05/23 0000 07/11/23 1053 08/24/23 0000 09/11/23 0000  WBC 4.1   < > 5.1   < > 5.5 5.0 7.4  NEUTROABS 1,915.00  --   --   --   --  2,560.00  --   HGB 6.9*   < > 7.7*   < > 11.0* 10.8* 13.2*  HCT 24*   < > 25.9*   < > 35.7* 34* 42  MCV  --   --  86.3  --  93.5  --   --   PLT 98*   < > 177   < > 125* 103* 103*   < > = values in this interval not displayed.   Lab Results  Component Value Date   TSH 1.16 05/08/2023   Lab Results  Component Value Date   HGBA1C 6.9 05/08/2023   Lab Results  Component Value Date   CHOL 95 11/07/2022   HDL 59 11/07/2022   LDLCALC 27 11/07/2022   TRIG 31 (A) 11/07/2022   CHOLHDL 2 02/25/2021     Significant Diagnostic Results in last 30 days: CUP PACEART INCLINIC DEVICE CHECK Result Date: 10/20/2023 Normal in-clinic _dual__ chamber pacemaker check. Presenting Rhythm: _AS/AP - VP__ . Routine testing of thresholds, sensing, and impedance demonstrate stable parameters and no programming changes needed at this time. No recent episodes. Estimated longevity __8.5 years__ . Pt enrolled in remote follow-up. CANDIE Needle, NP    Assessment & Plan Fecal impaction in rectum [facility]) Another attempt at fecal disimpaction performed today.  His rectum is finally empty.  He has had several enemas yesterday.  Patient's fiber supplement has been discontinued.  This is likely the cause of his fecal impaction as he does not consume a lot of water.  His fiber supplement was likely extracting water out of his stool and then making his stool very claylike.  Patient has been placed on daily MiraLAX .   Hopefully this will prevent any further constipation/fecal impaction issues.        Camellia Door, DO  [facility] & Adult Medicine [phone]

## 2023-10-25 NOTE — Assessment & Plan Note (Addendum)
 Another attempt at fecal disimpaction performed today.  His rectum is finally empty.  He has had several enemas yesterday.  Patient's fiber supplement has been discontinued.  This is likely the cause of his fecal impaction as he does not consume a lot of water.  His fiber supplement was likely extracting water out of his stool and then making his stool very claylike.  Patient has been placed on daily MiraLAX .  Hopefully this will prevent any further constipation/fecal impaction issues.

## 2023-11-03 ENCOUNTER — Encounter: Payer: Self-pay | Admitting: Oncology

## 2023-11-08 ENCOUNTER — Non-Acute Institutional Stay (SKILLED_NURSING_FACILITY): Payer: Self-pay | Admitting: Orthopedic Surgery

## 2023-11-08 ENCOUNTER — Encounter: Payer: Self-pay | Admitting: Orthopedic Surgery

## 2023-11-08 DIAGNOSIS — I482 Chronic atrial fibrillation, unspecified: Secondary | ICD-10-CM | POA: Diagnosis not present

## 2023-11-08 DIAGNOSIS — Z5181 Encounter for therapeutic drug level monitoring: Secondary | ICD-10-CM

## 2023-11-08 DIAGNOSIS — Z7984 Long term (current) use of oral hypoglycemic drugs: Secondary | ICD-10-CM

## 2023-11-08 DIAGNOSIS — E1140 Type 2 diabetes mellitus with diabetic neuropathy, unspecified: Secondary | ICD-10-CM

## 2023-11-08 DIAGNOSIS — I5032 Chronic diastolic (congestive) heart failure: Secondary | ICD-10-CM | POA: Diagnosis not present

## 2023-11-08 DIAGNOSIS — I10 Essential (primary) hypertension: Secondary | ICD-10-CM | POA: Diagnosis not present

## 2023-11-08 DIAGNOSIS — N401 Enlarged prostate with lower urinary tract symptoms: Secondary | ICD-10-CM

## 2023-11-08 DIAGNOSIS — N138 Other obstructive and reflux uropathy: Secondary | ICD-10-CM

## 2023-11-08 DIAGNOSIS — Z950 Presence of cardiac pacemaker: Secondary | ICD-10-CM

## 2023-11-08 DIAGNOSIS — D509 Iron deficiency anemia, unspecified: Secondary | ICD-10-CM

## 2023-11-08 DIAGNOSIS — F324 Major depressive disorder, single episode, in partial remission: Secondary | ICD-10-CM

## 2023-11-08 DIAGNOSIS — K5901 Slow transit constipation: Secondary | ICD-10-CM

## 2023-11-08 NOTE — Progress Notes (Signed)
 Location:  Other [facility]  Nursing Home Room Number: [id number] Place of Service:  SNF [phone]) Provider:  Greig Cluster, NP  PCP: Laurence Locus, DO  Patient Care Team: Laurence Locus, DO as PCP - General (Internal Medicine) Perla Evalene PARAS, MD as PCP - Cardiology (Cardiology) Cindie Ole DASEN, MD as PCP - Electrophysiology (Clinical Cardiac Electrophysiology) Perla Evalene PARAS, MD as Consulting Physician (Cardiology) Melanee Annah BROCKS, MD as Consulting Physician (Hematology and Oncology) Pa, [facility] (Optometry) Riddle, Suzann, NP as Nurse Practitioner (Clinical Cardiac Electrophysiology)  Extended Emergency Contact Information Primary Emergency Contact: Rocky [facility]) Address: [address]          [place], [place] [address] [place] Phone: [phone] Relation: Son  Code Status:  DNR Goals of care: Advanced Directive information    10/24/2023    2:34 PM  Advanced Directives  Does Patient Have a Medical Advance Directive? Yes  Type of Estate Agent of [place];Living will;Out of facility DNR (pink MOST or yellow form)  Does patient want to make changes to medical advance directive? No - Patient declined  Copy of Healthcare Power of Attorney in Chart? Yes - validated most recent copy scanned in chart (See row information)     Chief Complaint  Patient presents with   Medical Management of Chronic Issues    Medical Management of Chronic Issues.     HPI:  Pt is a 89 y.o. male seen today for medical management of chronic diseases.    He currently resides on the skilled nursing unit at [facility]. PMH: aortic atherosclerosis, atrial fib, CHF, CHB, stroke, tachy brady syndrome s/p pacemaker 2023, asthma, OSA, GERD, T2DM with neuropathy, BPH, HLD, left elbow fracture 2023, iron deficiency anemia and constipation.   PAF- TSH 1.16 05/08/2023, rate controlled without medication, remains on Eliquis  CHF- BUN/creat  30/0.7 05/18/2023, LVEF 55-60%, not on medication HTN- see trends below, not on medication T2DM- A1c 6.9 05/08/2023, remains on metformin   BPH- remains in finasteride  and tamsulosin  Depression- Na+ 139 05/08/2023, remains on Zoloft Iron deficiency anemia- hgb 13.2, iron 83, ferritin 26, TIBC 305 09/11/2023, remains on ferrous gluconate Constipation- recent fecal impaction 10/20-10/22  BIMS score 10/15 10/06/2023  Recent weights:  11/01- 173 lbs  10/01- 173.6 lbs  09/01- 173 lbs  Recent blood pressures:  11/05- 138/81, 128/70  10/29- 130/76, 129/74  Past Medical History:  Diagnosis Date   Asthma    Atrial flutter (HCC) 2007   Band keratopathy    Benign prostatic hypertrophy    Chronic ear infection    Chronic kidney disease    Chronic prostatitis    Dental bridge present    permanent - upper   Diabetes mellitus    Elbow stiffness, left    s/p fracture many yrs ago.  arm does not straighten   Encounter for anticoagulation discussion and counseling 07/21/2010   Fall 12/04/2021   GERD (gastroesophageal reflux disease)    laryngeal involvement   Hyperlipidemia    Hypertension    Obstructive sleep apnea    CPAP-9   Orchitis of right testicle 11/30/2020   Osteoarthrosis, localized, primary, knee    post-traumatic   Prostatitis    Sinus drainage 03/11/2022   Skin cancer    Stroke [facility])    TIA   Tachy-brady syndrome (HCC)    UTI (urinary tract infection)    Past Surgical History:  Procedure Laterality Date   APPENDECTOMY     BELPHAROPTOSIS  REPAIR     Dr [name]---didn't resolve weepy eye and eyelid drooping   CARDIOVERSION  04/13/2010   CATARACT EXTRACTION, BILATERAL  2009   Chest pain  8/12   Stress test benign   ESOPHAGEAL DILATION  05/26/2016   Procedure: ESOPHAGEAL DILATION;  Surgeon: Jinny Carmine, MD;  Location: [facility];  Service: Endoscopy;;   ESOPHAGOGASTRODUODENOSCOPY (EGD) WITH PROPOFOL  N/A 05/26/2016   Procedure: ESOPHAGOGASTRODUODENOSCOPY  (EGD) WITH PROPOFOL ;  Surgeon: Jinny Carmine, MD;  Location: [facility];  Service: Endoscopy;  Laterality: N/A;  Diabetic - oral meds sleep apnea   EYE SURGERY     FRACTURE SURGERY Left    elbow   KNEE SURGERY  1998   plate after fracture, then removed for infection   left elbow surgery     MASTOIDECTOMY  8/08   Dr Rena   PACEMAKER IMPLANT N/A 09/02/2021   Procedure: PACEMAKER IMPLANT;  Surgeon: Cindie Ole DASEN, MD;  Location: [facility] INVASIVE CV LAB;  Service: Cardiovascular;  Laterality: N/A;   RHINOPLASTY  5/10   and septoplasty   SUBACROMIAL DECOMPRESSION Right 2005   Arthroscopic (for rotator cuff and biceps tendon ruptures)   TEAR DUCT PROBING  11/13   Dr Ashley   TOTAL KNEE ARTHROPLASTY Left 09/01/2014   Procedure: TOTAL KNEE ARTHROPLASTY;  Surgeon: Lynwood SHAUNNA Hue, MD;  Location: [facility] ORS;  Service: Orthopedics;  Laterality: Left;   TRIGGER FINGER RELEASE Left 02/25/2015   Procedure: LEFT LONG TRIGGER RELEASE;  Surgeon: Lynwood SHAUNNA Hue, MD;  Location: [facility] ORS;  Service: Orthopedics;  Laterality: Left;   VENOUS ABLATION      Allergies  Allergen Reactions   Lovenox [Enoxaparin] Hives   Proscar  [Finasteride ] Other (See Comments)    Upper body and arm weakness   Latex Rash    Has trouble with BANDAIDS that have been left on for more than 24 hours. Prefers paper tape.   Nickel Rash    Localized rash   Percocet [Oxycodone-Acetaminophen ] Rash   Tape Rash and Other (See Comments)    Sensitivity     Outpatient Encounter Medications as of 11/08/2023  Medication Sig   acetaminophen  (TYLENOL ) 500 MG tablet Take 1,000 mg by mouth every 8 (eight) hours as needed.   apixaban  (ELIQUIS ) 2.5 MG TABS tablet Take 2.5 mg by mouth 2 (two) times daily.   APPLE CIDER VINEGAR PO Take 1 capsule by mouth at bedtime.   atorvastatin  (LIPITOR ) 80 MG tablet Take 1 tablet (80 mg total) by mouth daily.   Cranberry 250 MG TABS Take 1 tablet by mouth 2 (two) times daily.   Cyanocobalamin   (B-12 PO) Take 1,000 mg by mouth daily.   docusate sodium  (COLACE) 100 MG capsule Take 100 mg by mouth 2 (two) times daily.   finasteride  (PROSCAR ) 5 MG tablet Take 1 tablet (5 mg total) by mouth daily.   guaiFENesin  (MUCINEX ) 600 MG 12 hr tablet Take 600 mg by mouth every 8 (eight) hours as needed.   guaifenesin  (ROBITUSSIN) 100 MG/5ML syrup Take 10 mLs by mouth every 4 (four) hours as needed for cough.   IRON, FERROUS GLUCONATE, PO Take 65 mg by mouth daily.   lactose free nutrition (BOOST) LIQD Take 237 mLs by mouth 3 (three) times daily between meals.   lidocaine  (LIDODERM ) 5 % Place 1 patch onto the skin daily. Remove & Discard patch within 12 hours or as directed by MD   Magnesium  Oxide 400 MG CAPS Take 1 capsule (400 mg total) by mouth daily.  metFORMIN  (GLUCOPHAGE ) 500 MG tablet TAKE 1 TABLET BY MOUTH  TWICE DAILY WITH MEALS   mineral oil-hydrophilic petrolatum (AQUAPHOR) ointment Apply 1 Application topically daily as needed for dry skin. To left lower leg   Polyethyl Glycol-Propyl Glycol (SYSTANE ULTRA OP) Place 1 drop into both eyes daily as needed (dry eyes).   polyethylene glycol (MIRALAX  / GLYCOLAX ) 17 g packet Take 17 g by mouth daily.   saccharomyces boulardii (FLORASTOR) 250 MG capsule Take 250 mg by mouth daily.   sertraline (ZOLOFT) 50 MG tablet Take 50 mg by mouth daily.   sodium chloride  (OCEAN) 0.65 % SOLN nasal spray Place 2 sprays into both nostrils 2 (two) times daily as needed for congestion.   Sodium Fluoride (PREVIDENT 5000 BOOSTER PLUS) 1.1 % PSTE Place 1 application  onto teeth at bedtime as needed.   tamsulosin  (FLOMAX ) 0.4 MG CAPS capsule Take 2 capsules (0.8 mg total) by mouth at bedtime.   No facility-administered encounter medications on file as of 11/08/2023.    Review of Systems  Constitutional: Negative.   HENT: Negative.    Respiratory: Negative.    Cardiovascular: Negative.   Gastrointestinal:  Positive for constipation. Negative for diarrhea, nausea  and vomiting.  Genitourinary:  Positive for frequency.  Musculoskeletal: Negative.   Skin: Negative.   Neurological: Negative.   Psychiatric/Behavioral:  Positive for confusion and dysphoric mood. Negative for sleep disturbance. The patient is not nervous/anxious.     Immunization History  Administered Date(s) Administered   Fluad Quad(high Dose 65+) 09/14/2018   H1N1 12/13/2007   INFLUENZA, HIGH DOSE SEASONAL PF 09/20/2012, 09/07/2017   Influenza Split 09/11/2010   Influenza Whole 09/12/2011   Influenza, Seasonal, Injecte, Preservative Fre 09/08/2007, 09/26/2008, 10/02/2014, 09/18/2015   Influenza,inj,Quad PF,6+ Mos 09/19/2013, 09/21/2015   Influenza-Unspecified 09/07/2017, 09/11/2019, 09/03/2020, 10/19/2021, 10/26/2022, 10/17/2023, 10/17/2023   Moderna Sars-Covid-2 Vaccination 01/18/2019, 02/15/2019, 11/19/2019, 10/17/2020, 06/01/2021   PNEUMOCOCCAL CONJUGATE-20 12/13/2021   Pneumococcal Conjugate-13 09/19/2013   Pneumococcal Polysaccharide-23 09/26/2003, 06/28/2016   Td 08/04/1998   Tdap 06/04/2010, 08/15/2021   Tetanus 08/04/1998   Unspecified SARS-COV-2 Vaccination 06/01/2021, 09/30/2022, 04/14/2023   Zoster Recombinant(Shingrix) 06/29/2022   Zoster, Live 01/16/2004   Pertinent  Health Maintenance Due  Topic Date Due   HEMOGLOBIN A1C  11/08/2023   FOOT EXAM  02/08/2024   OPHTHALMOLOGY EXAM  06/19/2024   Influenza Vaccine  Completed      12/06/2021    7:05 AM 12/06/2021    7:40 PM 12/07/2021    8:45 AM 06/28/2022    2:03 PM 08/24/2023    9:14 AM  Fall Risk  Falls in the past year?    1 1  Was there an injury with Fall?    1 0  Fall Risk Category Calculator    3 2  (RETIRED) Patient Fall Risk Level High fall risk  High fall risk  High fall risk     Patient at Risk for Falls Due to    History of fall(s);Impaired balance/gait;Impaired mobility;Impaired vision History of fall(s);Impaired balance/gait;Impaired mobility     Data saved with a previous flowsheet row  definition   Functional Status Survey:    Vitals:   11/08/23 1114  BP: 130/76  Pulse: (!) 59  Resp: 20  Temp: 98.2 F (36.8 C)  SpO2: 95%  Weight: 173 lb (78.5 kg)  Height: 6' (1.829 m)   Body mass index is 23.46 kg/m. Physical Exam Vitals reviewed.  Constitutional:      General: He is not in acute distress.  HENT:     Head: Normocephalic.  Eyes:     General:        Right eye: No discharge.        Left eye: No discharge.  Cardiovascular:     Rate and Rhythm: Normal rate and regular rhythm.     Pulses: Normal pulses.     Heart sounds: Normal heart sounds.  Pulmonary:     Effort: Pulmonary effort is normal.     Breath sounds: Normal breath sounds.  Abdominal:     General: Bowel sounds are normal.     Palpations: Abdomen is soft.  Musculoskeletal:     Cervical back: Neck supple.     Right lower leg: No edema.     Left lower leg: No edema.  Skin:    General: Skin is warm.     Capillary Refill: Capillary refill takes less than 2 seconds.  Neurological:     General: No focal deficit present.     Mental Status: He is alert. Mental status is at baseline.     Gait: Gait abnormal.  Psychiatric:        Mood and Affect: Mood normal.     Labs reviewed: Recent Labs    05/08/23 0000 05/18/23 0000  NA 138 139  K 4.2 4.1  CL 105 105  CO2 30* 27*  BUN 26* 30*  CREATININE 0.8 0.7  CALCIUM  9.9 10.0   Recent Labs    05/08/23 0000  AST 12*  ALT 9*  ALKPHOS 64  ALBUMIN 3.2*   Recent Labs    05/08/23 0000 05/15/23 0000 05/23/23 1154 06/05/23 0000 07/11/23 1053 08/24/23 0000 09/11/23 0000  WBC 4.1   < > 5.1   < > 5.5 5.0 7.4  NEUTROABS 1,915.00  --   --   --   --  2,560.00  --   HGB 6.9*   < > 7.7*   < > 11.0* 10.8* 13.2*  HCT 24*   < > 25.9*   < > 35.7* 34* 42  MCV  --   --  86.3  --  93.5  --   --   PLT 98*   < > 177   < > 125* 103* 103*   < > = values in this interval not displayed.   Lab Results  Component Value Date   TSH 1.16 05/08/2023    Lab Results  Component Value Date   HGBA1C 6.9 05/08/2023   Lab Results  Component Value Date   CHOL 95 11/07/2022   HDL 59 11/07/2022   LDLCALC 27 11/07/2022   TRIG 31 (A) 11/07/2022   CHOLHDL 2 02/25/2021    Significant Diagnostic Results in last 30 days:  CUP PACEART INCLINIC DEVICE CHECK Result Date: 10/20/2023 Normal in-clinic _dual__ chamber pacemaker check. Presenting Rhythm: _AS/AP - VP__ . Routine testing of thresholds, sensing, and impedance demonstrate stable parameters and no programming changes needed at this time. No recent episodes. Estimated longevity __8.5 years__ . Pt enrolled in remote follow-up. CANDIE Needle, NP   Assessment/Plan 1. Encounter for medication titration (Primary) - reduce Zoloft to 25 mg po daily   2. Atrial fibrillation, chronic (HCC) - HR < 100 without medication  - cont Eliquic for clot prevention  3. Chronic diastolic heart failure (HCC) - compensated - not on medication  4. Essential hypertension - controlled without medication  5. Type 2 diabetes, controlled, with neuropathy (HCC) - A1c 6.9 05/2023 - no hypoglycemia - cont metformin   6. BPH with obstruction/lower urinary tract symptoms - cont finasteride  and tamsulosin   7. Major depressive disorder with single episode, in partial remission - stable mood - see above  8. Iron deficiency anemia, unspecified iron deficiency anemia type - hgb stable - cont ferrous gluconate  9. Slow transit constipation - 10/20-10/22 fecal impaction - asymptomatic - abdomen soft  - cont miralax   10. Pacemaker    Family/ staff Communication: plan discussed with patient and nurse  Labs/tests ordered:  routine labs 11/2023

## 2023-11-09 LAB — BASIC METABOLIC PANEL WITH GFR
BUN: 33 — AB (ref 4–21)
CO2: 31 — AB (ref 13–22)
Chloride: 100 (ref 99–108)
Creatinine: 0.8 (ref 0.6–1.3)
Glucose: 119
Potassium: 4.2 meq/L (ref 3.5–5.1)
Sodium: 137 (ref 137–147)

## 2023-11-09 LAB — HEPATIC FUNCTION PANEL
ALT: 8 U/L — AB (ref 10–40)
AST: 13 — AB (ref 14–40)
Alkaline Phosphatase: 71 (ref 25–125)
Bilirubin, Total: 0.6

## 2023-11-09 LAB — CBC AND DIFFERENTIAL
HCT: 38 — AB (ref 41–53)
Hemoglobin: 12.2 — AB (ref 13.5–17.5)
Neutrophils Absolute: 3666
Platelets: 131 K/uL — AB (ref 150–400)
WBC: 6

## 2023-11-09 LAB — COMPREHENSIVE METABOLIC PANEL WITH GFR
Albumin: 3.7 (ref 3.5–5.0)
Calcium: 10.8 — AB (ref 8.7–10.7)
Globulin: 2.5
eGFR: 84

## 2023-11-09 LAB — TSH: TSH: 0.78 (ref 0.41–5.90)

## 2023-11-09 LAB — CBC: RBC: 3.85 — AB (ref 3.87–5.11)

## 2023-11-09 LAB — HEMOGLOBIN A1C: Hemoglobin A1C: 6.5

## 2023-11-16 LAB — IRON,TIBC AND FERRITIN PANEL
%SAT: 30
Ferritin: 31
Iron: 75
TIBC: 254

## 2023-11-16 LAB — CBC AND DIFFERENTIAL
HCT: 37 — AB (ref 41–53)
Hemoglobin: 11.7 — AB (ref 13.5–17.5)
Platelets: 106 K/uL — AB (ref 150–400)
WBC: 5.5

## 2023-11-16 LAB — CBC: RBC: 3.75 — AB (ref 3.87–5.11)

## 2023-12-08 ENCOUNTER — Non-Acute Institutional Stay (SKILLED_NURSING_FACILITY): Payer: Self-pay | Admitting: Orthopedic Surgery

## 2023-12-08 ENCOUNTER — Encounter: Payer: Self-pay | Admitting: Orthopedic Surgery

## 2023-12-08 DIAGNOSIS — I482 Chronic atrial fibrillation, unspecified: Secondary | ICD-10-CM

## 2023-12-08 DIAGNOSIS — K5901 Slow transit constipation: Secondary | ICD-10-CM

## 2023-12-08 DIAGNOSIS — N401 Enlarged prostate with lower urinary tract symptoms: Secondary | ICD-10-CM

## 2023-12-08 DIAGNOSIS — D696 Thrombocytopenia, unspecified: Secondary | ICD-10-CM

## 2023-12-08 DIAGNOSIS — I5032 Chronic diastolic (congestive) heart failure: Secondary | ICD-10-CM | POA: Diagnosis not present

## 2023-12-08 DIAGNOSIS — Z7984 Long term (current) use of oral hypoglycemic drugs: Secondary | ICD-10-CM

## 2023-12-08 DIAGNOSIS — G3184 Mild cognitive impairment, so stated: Secondary | ICD-10-CM

## 2023-12-08 DIAGNOSIS — N138 Other obstructive and reflux uropathy: Secondary | ICD-10-CM

## 2023-12-08 DIAGNOSIS — D509 Iron deficiency anemia, unspecified: Secondary | ICD-10-CM

## 2023-12-08 DIAGNOSIS — Z950 Presence of cardiac pacemaker: Secondary | ICD-10-CM

## 2023-12-08 DIAGNOSIS — E1140 Type 2 diabetes mellitus with diabetic neuropathy, unspecified: Secondary | ICD-10-CM | POA: Diagnosis not present

## 2023-12-08 DIAGNOSIS — I10 Essential (primary) hypertension: Secondary | ICD-10-CM

## 2023-12-08 NOTE — Progress Notes (Signed)
 Location:  Other [place].  Nursing Home Room Number: [facility] [id number] Place of Service:  SNF [id number]) Provider:  Greig Cluster, NP  PCP: Laurence Locus, DO  Patient Care Team: Laurence Locus, DO as PCP - General (Internal Medicine) Perla Evalene PARAS, MD as PCP - Cardiology (Cardiology) Cindie Ole DASEN, MD as PCP - Electrophysiology (Clinical Cardiac Electrophysiology) Perla Evalene PARAS, MD as Consulting Physician (Cardiology) Melanee Annah BROCKS, MD as Consulting Physician (Hematology and Oncology) [place], [facility] (Optometry) Riddle, Suzann, NP as Nurse Practitioner (Clinical Cardiac Electrophysiology)  Extended Emergency Contact Information Primary Emergency Contact: Rocky [facility]) Address: [address]          [place], [place] [address] [place] Phone: [phone] Relation: Son  Code Status:  DNR Goals of care: Advanced Directive information    10/24/2023    2:34 PM  Advanced Directives  Does Patient Have a Medical Advance Directive? Yes  Type of Estate Agent of [place];Living will;Out of facility DNR (pink MOST or yellow form)  Does patient want to make changes to medical advance directive? No - Patient declined  Copy of Healthcare Power of Attorney in Chart? Yes - validated most recent copy scanned in chart (See row information)     Chief Complaint  Patient presents with   Medical Management of Chronic Issues    Medical Management of Chronic Issues.     HPI:  Pt is a 89 y.o. male seen today for medical management of chronic diseases.    He currently resides on the skilled nursing unit at [facility]. PMH: aortic atherosclerosis, atrial fib, CHF, CHB, stroke, tachy brady syndrome s/p pacemaker 2023, asthma, OSA, GERD, T2DM with neuropathy, BPH, HLD, left elbow fracture 2023, iron deficiency anemia and constipation.   Atrial fib- TSH 0.78 11/09/2023, rate controlled without medication, remains on Eliquis  CHF-  BUN/creat 33/0.8 11/09/2023, LVEF 55-60%, not on medication HTN- see trends below, not on medication T2DM- A1c 6.5 11/09/2023, remains on metformin   BPH- remains in finasteride  and tamsulosin  Depression- Na+ 137 11/09/2023, remains on Zoloft> reduced last month Iron deficiency anemia- hgb 12.2 11/09/2023, iron 83, ferritin 26, TIBC 305 09/11/2023, remains on ferrous gluconate Thrombocytopenia- platelets 131 (11/06)> was 103( 09/2) Constipation- recent fecal impaction 10/20-10/22 Cognitive impairment- recent BIMS 10/15 10/06/2023, CT head noted decreased density of periventricular white matter 08/2021, will refuse transfers or ADLs at times, hoyer transfer, not on medication  Recent weights:  12/01- 180.2 lbs  11/01- 173 lbs   10/01- 173.6 lbs  Recent blood pressures:  12/03- 143/84, 142/73  11/26- 124/70, 138/72     Past Medical History:  Diagnosis Date   Asthma    Atrial flutter (HCC) 2007   Band keratopathy    Benign prostatic hypertrophy    Chronic ear infection    Chronic kidney disease    Chronic prostatitis    Dental bridge present    permanent - upper   Diabetes mellitus    Elbow stiffness, left    s/p fracture many yrs ago.  arm does not straighten   Encounter for anticoagulation discussion and counseling 07/21/2010   Fall 12/04/2021   GERD (gastroesophageal reflux disease)    laryngeal involvement   Hyperlipidemia    Hypertension    Obstructive sleep apnea    CPAP-9   Orchitis of right testicle 11/30/2020   Osteoarthrosis, localized, primary, knee    post-traumatic   Prostatitis    Sinus drainage 03/11/2022   Skin cancer  Stroke [facility])    TIA   Tachy-brady syndrome (HCC)    UTI (urinary tract infection)    Past Surgical History:  Procedure Laterality Date   APPENDECTOMY     BELPHAROPTOSIS REPAIR     Dr [name]---didn't resolve weepy eye and eyelid drooping   CARDIOVERSION  04/13/2010   CATARACT EXTRACTION, BILATERAL  2009   Chest pain  8/12    Stress test benign   ESOPHAGEAL DILATION  05/26/2016   Procedure: ESOPHAGEAL DILATION;  Surgeon: Jinny Carmine, MD;  Location: [facility];  Service: Endoscopy;;   ESOPHAGOGASTRODUODENOSCOPY (EGD) WITH PROPOFOL  N/A 05/26/2016   Procedure: ESOPHAGOGASTRODUODENOSCOPY (EGD) WITH PROPOFOL ;  Surgeon: Jinny Carmine, MD;  Location: [facility];  Service: Endoscopy;  Laterality: N/A;  Diabetic - oral meds sleep apnea   EYE SURGERY     FRACTURE SURGERY Left    elbow   KNEE SURGERY  1998   plate after fracture, then removed for infection   left elbow surgery     MASTOIDECTOMY  8/08   Dr Rena   PACEMAKER IMPLANT N/A 09/02/2021   Procedure: PACEMAKER IMPLANT;  Surgeon: Cindie Ole DASEN, MD;  Location: [facility] INVASIVE CV LAB;  Service: Cardiovascular;  Laterality: N/A;   RHINOPLASTY  5/10   and septoplasty   SUBACROMIAL DECOMPRESSION Right 2005   Arthroscopic (for rotator cuff and biceps tendon ruptures)   TEAR DUCT PROBING  11/13   Dr Ashley   TOTAL KNEE ARTHROPLASTY Left 09/01/2014   Procedure: TOTAL KNEE ARTHROPLASTY;  Surgeon: Lynwood SHAUNNA Hue, MD;  Location: [facility] ORS;  Service: Orthopedics;  Laterality: Left;   TRIGGER FINGER RELEASE Left 02/25/2015   Procedure: LEFT LONG TRIGGER RELEASE;  Surgeon: Lynwood SHAUNNA Hue, MD;  Location: [facility] ORS;  Service: Orthopedics;  Laterality: Left;   VENOUS ABLATION      Allergies  Allergen Reactions   Lovenox [Enoxaparin] Hives   Proscar  [Finasteride ] Other (See Comments)    Upper body and arm weakness   Latex Rash    Has trouble with BANDAIDS that have been left on for more than 24 hours. Prefers paper tape.   Nickel Rash    Localized rash   Percocet [Oxycodone-Acetaminophen ] Rash   Tape Rash and Other (See Comments)    Sensitivity     Outpatient Encounter Medications as of 12/08/2023  Medication Sig   acetaminophen  (TYLENOL ) 500 MG tablet Take 1,000 mg by mouth every 8 (eight) hours as needed.   apixaban  (ELIQUIS ) 2.5 MG TABS tablet  Take 2.5 mg by mouth 2 (two) times daily.   APPLE CIDER VINEGAR PO Take 1 capsule by mouth at bedtime.   atorvastatin  (LIPITOR ) 80 MG tablet Take 1 tablet (80 mg total) by mouth daily.   Cranberry 250 MG TABS Take 1 tablet by mouth 2 (two) times daily.   Cyanocobalamin  (B-12 PO) Take 1,000 mg by mouth daily.   diclofenac Sodium (VOLTAREN) 1 % GEL Apply 4 g topically every 6 (six) hours as needed.   docusate sodium  (COLACE) 100 MG capsule Take 100 mg by mouth 2 (two) times daily.   finasteride  (PROSCAR ) 5 MG tablet Take 1 tablet (5 mg total) by mouth daily.   guaiFENesin  (MUCINEX ) 600 MG 12 hr tablet Take 600 mg by mouth every 8 (eight) hours as needed.   guaifenesin  (ROBITUSSIN) 100 MG/5ML syrup Take 10 mLs by mouth every 4 (four) hours as needed for cough.   IRON, FERROUS GLUCONATE, PO Take 65 mg by mouth daily.   lactose free nutrition (  BOOST) LIQD Take 237 mLs by mouth 3 (three) times daily between meals.   lidocaine  (LIDODERM ) 5 % Place 1 patch onto the skin daily. Remove & Discard patch within 12 hours or as directed by MD   Magnesium  Oxide 400 MG CAPS Take 1 capsule (400 mg total) by mouth daily.   metFORMIN  (GLUCOPHAGE ) 500 MG tablet TAKE 1 TABLET BY MOUTH  TWICE DAILY WITH MEALS   mineral oil-hydrophilic petrolatum (AQUAPHOR) ointment Apply 1 Application topically daily as needed for dry skin. To left lower leg   Polyethyl Glycol-Propyl Glycol (SYSTANE ULTRA OP) Place 1 drop into both eyes daily as needed (dry eyes).   polyethylene glycol (MIRALAX  / GLYCOLAX ) 17 g packet Take 17 g by mouth daily.   saccharomyces boulardii (FLORASTOR) 250 MG capsule Take 250 mg by mouth daily.   sertraline (ZOLOFT) 25 MG tablet Take 25 mg by mouth daily.   sodium chloride  (OCEAN) 0.65 % SOLN nasal spray Place 2 sprays into both nostrils 2 (two) times daily as needed for congestion.   Sodium Fluoride (PREVIDENT 5000 BOOSTER PLUS) 1.1 % PSTE Place 1 application  onto teeth at bedtime as needed.    tamsulosin  (FLOMAX ) 0.4 MG CAPS capsule Take 2 capsules (0.8 mg total) by mouth at bedtime.   No facility-administered encounter medications on file as of 12/08/2023.    Review of Systems  Constitutional: Negative.   HENT: Negative.    Eyes: Negative.   Respiratory: Negative.    Cardiovascular: Negative.   Gastrointestinal:  Positive for constipation.  Genitourinary: Negative.   Musculoskeletal:  Positive for gait problem.  Skin: Negative.   Neurological:  Positive for weakness.  Psychiatric/Behavioral:  Positive for dysphoric mood. Negative for sleep disturbance. The patient is not nervous/anxious.     Immunization History  Administered Date(s) Administered   Fluad Quad(high Dose 65+) 09/14/2018   H1N1 12/13/2007   INFLUENZA, HIGH DOSE SEASONAL PF 09/20/2012, 09/07/2017   Influenza Split 09/11/2010   Influenza Whole 09/12/2011   Influenza, Seasonal, Injecte, Preservative Fre 09/08/2007, 09/26/2008, 10/02/2014, 09/18/2015   Influenza,inj,Quad PF,6+ Mos 09/19/2013, 09/21/2015   Influenza-Unspecified 09/07/2017, 09/11/2019, 09/03/2020, 10/19/2021, 10/26/2022, 10/17/2023, 10/17/2023   Moderna Sars-Covid-2 Vaccination 01/18/2019, 02/15/2019, 11/19/2019, 10/17/2020, 06/01/2021   PNEUMOCOCCAL CONJUGATE-20 12/13/2021   Pneumococcal Conjugate-13 09/19/2013   Pneumococcal Polysaccharide-23 09/26/2003, 06/28/2016   Td 08/04/1998   Tdap 06/04/2010, 08/15/2021   Tetanus 08/04/1998   Unspecified SARS-COV-2 Vaccination 06/01/2021, 09/30/2022, 04/14/2023   Zoster Recombinant(Shingrix) 06/29/2022   Zoster, Live 01/16/2004   Pertinent  Health Maintenance Due  Topic Date Due   FOOT EXAM  02/08/2024   HEMOGLOBIN A1C  05/08/2024   OPHTHALMOLOGY EXAM  06/19/2024   Influenza Vaccine  Completed      12/06/2021    7:40 PM 12/07/2021    8:45 AM 06/28/2022    2:03 PM 08/24/2023    9:14 AM 11/12/2023    1:24 PM  Fall Risk  Falls in the past year?   1 1 0  Was there an injury with Fall?   1  0   0   Fall Risk Category Calculator   3 2 0  (RETIRED) Patient Fall Risk Level High fall risk  High fall risk      Patient at Risk for Falls Due to   History of fall(s);Impaired balance/gait;Impaired mobility;Impaired vision History of fall(s);Impaired balance/gait;Impaired mobility Impaired balance/gait  Fall risk Follow up     Falls evaluation completed     Data saved with a previous flowsheet row definition  Functional Status Survey:    Vitals:   12/08/23 0913  BP: (!) 143/84  Pulse: 70  Resp: 20  Temp: 98 F (36.7 C)  SpO2: 96%  Weight: 180 lb 3.2 oz (81.7 kg)  Height: 6' (1.829 m)   Body mass index is 24.44 kg/m. Physical Exam Vitals reviewed.  Constitutional:      General: He is not in acute distress. HENT:     Head: Normocephalic.     Right Ear: There is no impacted cerumen.     Left Ear: There is no impacted cerumen.     Ears:     Comments: HOH    Nose: Nose normal.     Mouth/Throat:     Mouth: Mucous membranes are moist.  Eyes:     General:        Right eye: No discharge.        Left eye: No discharge.  Cardiovascular:     Rate and Rhythm: Normal rate and regular rhythm.     Pulses: Normal pulses.     Heart sounds: Normal heart sounds.  Pulmonary:     Effort: Pulmonary effort is normal.     Breath sounds: Normal breath sounds.  Abdominal:     General: Bowel sounds are normal. There is no distension.     Palpations: Abdomen is soft.     Tenderness: There is no abdominal tenderness.  Musculoskeletal:     Cervical back: Neck supple.     Right lower leg: No edema.     Left lower leg: No edema.  Skin:    General: Skin is warm.     Capillary Refill: Capillary refill takes less than 2 seconds.  Neurological:     General: No focal deficit present.     Mental Status: He is alert. Mental status is at baseline.     Motor: Weakness present.     Gait: Gait abnormal.  Psychiatric:        Mood and Affect: Mood normal.     Comments: Alert to self/place/time,  followed commands     Labs reviewed: Recent Labs    05/08/23 0000 05/18/23 0000 11/09/23 0000  NA 138 139 137  K 4.2 4.1 4.2  CL 105 105 100  CO2 30* 27* 31*  BUN 26* 30* 33*  CREATININE 0.8 0.7 0.8  CALCIUM  9.9 10.0 10.8*   Recent Labs    05/08/23 0000 11/09/23 0000  AST 12* 13*  ALT 9* 8*  ALKPHOS 64 71  ALBUMIN 3.2* 3.7   Recent Labs    05/08/23 0000 05/15/23 0000 05/23/23 1154 06/05/23 0000 07/11/23 1053 08/24/23 0000 09/11/23 0000 11/09/23 0000  WBC 4.1   < > 5.1   < > 5.5 5.0 7.4 6.0  NEUTROABS 1,915.00  --   --   --   --  2,560.00  --  3,666.00  HGB 6.9*   < > 7.7*   < > 11.0* 10.8* 13.2* 12.2*  HCT 24*   < > 25.9*   < > 35.7* 34* 42 38*  MCV  --   --  86.3  --  93.5  --   --   --   PLT 98*   < > 177   < > 125* 103* 103* 131*   < > = values in this interval not displayed.   Lab Results  Component Value Date   TSH 0.78 11/09/2023   Lab Results  Component Value Date   HGBA1C 6.5 11/09/2023  Lab Results  Component Value Date   CHOL 95 11/07/2022   HDL 59 11/07/2022   LDLCALC 27 11/07/2022   TRIG 31 (A) 11/07/2022   CHOLHDL 2 02/25/2021    Significant Diagnostic Results in last 30 days:  No results found.  Assessment/Plan 1. Atrial fibrillation, chronic (HCC) (Primary) - TSH stable - HR< 100 without medication - cont Eliquis  for clot prevention   2. Chronic diastolic heart failure (HCC) - compensated - not on medication   3. Essential hypertension - controlled without medication   4. Type 2 diabetes, controlled, with neuropathy (HCC) -  A1c 6.9 05/2023 - no hypoglycemia - cont metformin    5. BPH with obstruction/lower urinary tract symptoms - cont finasteride  and tamsulosin    6. Iron deficiency anemia, unspecified iron deficiency anemia type - hgb stable - cont ferrous gluconate  7. Thrombocytopenia - chronic - stable   8. Slow transit constipation - abdomen soft - cont miralax   9. MCI (mild cognitive impairment) -  recent BIMS 10/15 - appropriate today> ? HOH - past CT head noted decreased density of periventricular white matter - will refuse transfer or ADLs at times - not on medication - cont skilled nursing   10. Pacemaker   Family/ staff Communication: plan discussed with patient and nurse  Labs/tests ordered:  none

## 2023-12-11 ENCOUNTER — Ambulatory Visit: Payer: Medicare Other

## 2023-12-12 LAB — CUP PACEART REMOTE DEVICE CHECK
Battery Remaining Longevity: 102 mo
Battery Remaining Percentage: 97 %
Brady Statistic RA Percent Paced: 44 %
Brady Statistic RV Percent Paced: 95 %
Date Time Interrogation Session: 20251208000000
Implantable Lead Connection Status: 753985
Implantable Lead Connection Status: 753985
Implantable Lead Implant Date: 20230831
Implantable Lead Implant Date: 20230831
Implantable Lead Location: 753859
Implantable Lead Location: 753860
Implantable Lead Model: 7841
Implantable Lead Model: 7842
Implantable Lead Serial Number: 1220926
Implantable Lead Serial Number: 1322603
Implantable Pulse Generator Implant Date: 20230831
Lead Channel Impedance Value: 511 Ohm
Lead Channel Impedance Value: 511 Ohm
Lead Channel Pacing Threshold Amplitude: 0.5 V
Lead Channel Pacing Threshold Amplitude: 1 V
Lead Channel Pacing Threshold Pulse Width: 0.4 ms
Lead Channel Pacing Threshold Pulse Width: 0.4 ms
Lead Channel Setting Pacing Amplitude: 3.5 V
Lead Channel Setting Pacing Amplitude: 3.5 V
Lead Channel Setting Pacing Pulse Width: 0.4 ms
Lead Channel Setting Sensing Sensitivity: 3.5 mV
Pulse Gen Serial Number: 613820
Zone Setting Status: 755011

## 2023-12-13 NOTE — Progress Notes (Signed)
 Remote PPM Transmission

## 2023-12-21 ENCOUNTER — Ambulatory Visit: Payer: Self-pay | Admitting: Cardiology

## 2024-01-17 ENCOUNTER — Non-Acute Institutional Stay (SKILLED_NURSING_FACILITY): Admitting: Internal Medicine

## 2024-01-17 ENCOUNTER — Encounter: Payer: Self-pay | Admitting: Internal Medicine

## 2024-01-17 DIAGNOSIS — I10 Essential (primary) hypertension: Secondary | ICD-10-CM

## 2024-01-17 DIAGNOSIS — Z7984 Long term (current) use of oral hypoglycemic drugs: Secondary | ICD-10-CM | POA: Diagnosis not present

## 2024-01-17 DIAGNOSIS — D509 Iron deficiency anemia, unspecified: Secondary | ICD-10-CM

## 2024-01-17 DIAGNOSIS — E1140 Type 2 diabetes mellitus with diabetic neuropathy, unspecified: Secondary | ICD-10-CM | POA: Diagnosis not present

## 2024-01-17 DIAGNOSIS — N401 Enlarged prostate with lower urinary tract symptoms: Secondary | ICD-10-CM | POA: Diagnosis not present

## 2024-01-17 DIAGNOSIS — I482 Chronic atrial fibrillation, unspecified: Secondary | ICD-10-CM

## 2024-01-17 DIAGNOSIS — D696 Thrombocytopenia, unspecified: Secondary | ICD-10-CM

## 2024-01-17 DIAGNOSIS — N138 Other obstructive and reflux uropathy: Secondary | ICD-10-CM | POA: Diagnosis not present

## 2024-01-17 DIAGNOSIS — I5032 Chronic diastolic (congestive) heart failure: Secondary | ICD-10-CM

## 2024-01-17 DIAGNOSIS — Z950 Presence of cardiac pacemaker: Secondary | ICD-10-CM | POA: Diagnosis not present

## 2024-01-17 DIAGNOSIS — E782 Mixed hyperlipidemia: Secondary | ICD-10-CM | POA: Diagnosis not present

## 2024-01-17 NOTE — Assessment & Plan Note (Signed)
 Continue with finasteride  5 mg daily and Flomax  0.8 mg daily.

## 2024-01-17 NOTE — Assessment & Plan Note (Signed)
 Chronic.  He has a history of tachybradycardia syndrome status post permanent pacemaker.  He is not on any rate controlling medications.  Remains on Eliquis  2.5 mg a day for CVA prophylaxis.

## 2024-01-17 NOTE — Assessment & Plan Note (Signed)
 Platelet count hovering around 106,000.  Stable.  No recent bleeding.

## 2024-01-17 NOTE — Progress Notes (Signed)
 [facility] Routine Visit Progress Note    Location:  Other [place].  Nursing Home Room Number: [facility] [id number] Place of Service:  SNF (31)   Laurence Locus, DO   Patient Care Team: Laurence Locus, DO as PCP - General (Internal Medicine) Perla Evalene PARAS, MD as PCP - Cardiology (Cardiology) Cindie Ole DASEN, MD (Inactive) as PCP - Electrophysiology (Clinical Cardiac Electrophysiology) Perla Evalene PARAS, MD as Consulting Physician (Cardiology) Melanee Annah BROCKS, MD as Consulting Physician (Hematology and Oncology) Pa, [facility] (Optometry) Riddle, Suzann, NP as Nurse Practitioner (Clinical Cardiac Electrophysiology)   Extended Emergency Contact Information Primary Emergency Contact: Laksh, [facility]) Address: [address]          [place], [place] [address] [place] Phone: [phone] Relation: Son   Goals of care: Advanced Directive information    01/17/2024   10:26 AM  Advanced Directives  Does Patient Have a Medical Advance Directive? Yes  Type of Estate Agent of [place];Living will;Out of facility DNR (pink MOST or yellow form)  Does patient want to make changes to medical advance directive? No - Patient declined  Copy of Healthcare Power of Attorney in Chart? Yes - validated most recent copy scanned in chart (See row information)    CODE STATUS: Do Not Resuscitate (DNR)   Chief Complaint  Patient presents with   Medical Management of Chronic Issues    Medical Management of Chronic Issues.      HPI: Pt is a 89 y.o. male seen today for medical management of chronic disease.   Michael Colon is a 89 year old male who is being seen for routine medical care at [facility].  He is a long-term care resident.  He has been at [facility] community since April 2022.  He was admitted to [facility] skilled nursing facility on December 07, 2021.  He has a past medical history of Tachybradycardia  syndrome  status post permanent pacemaker, BPH, reflux, essential hypertension, diabetes, mixed hyperlipidemia,  Patient seen sitting in the TV room, proudly wearing his [employer] blue devils baseball hat.  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   Past Medical History:  Diagnosis Date   Asthma    Asthma    Atrial flutter (HCC) 2007   Band keratopathy    Benign prostatic hypertrophy    Chronic ear infection    Chronic kidney disease    Chronic prostatitis    Complete heart block (HCC) 09/01/2021   Dental bridge present    permanent - upper   Diabetes mellitus    Elbow stiffness, left    s/p fracture many yrs ago.  arm does not straighten   Encounter for anticoagulation discussion and counseling 07/21/2010   Fall 12/04/2021   Fecal impaction in rectum (HCC) 10/24/2023   GERD (gastroesophageal reflux disease)    laryngeal involvement   History of CVA (cerebrovascular accident) 07/23/2023   Hyperlipidemia    Hypertension    Obstructive sleep apnea    CPAP-9   Orchitis of right testicle 11/30/2020   Osteoarthrosis, localized, primary, knee    post-traumatic   Prostatitis    Sinus drainage 03/11/2022   Skin cancer    Stroke [facility])    TIA   Tachy-brady syndrome (HCC)    TIA (transient ischemic attack) 12/11/2012   UTI (urinary tract infection)    Ventricular tachycardia-pause dependent 10/16/2012   Past Surgical History:  Procedure Laterality Date   APPENDECTOMY     BELPHAROPTOSIS REPAIR     Dr [name]---didn't resolve weepy eye and eyelid drooping   CARDIOVERSION  04/13/2010   CATARACT EXTRACTION, BILATERAL  2009   Chest pain  8/12   Stress test benign   ESOPHAGEAL DILATION  05/26/2016   Procedure: ESOPHAGEAL DILATION;  Surgeon: Jinny Carmine, MD;  Location: [facility];  Service: Endoscopy;;   ESOPHAGOGASTRODUODENOSCOPY (EGD) WITH PROPOFOL  N/A 05/26/2016   Procedure: ESOPHAGOGASTRODUODENOSCOPY (EGD)  WITH PROPOFOL ;  Surgeon: Jinny Carmine, MD;  Location: [facility];  Service: Endoscopy;  Laterality: N/A;  Diabetic - oral meds sleep apnea   EYE SURGERY     FRACTURE SURGERY Left    elbow   KNEE SURGERY  1998   plate after fracture, then removed for infection   left elbow surgery     MASTOIDECTOMY  8/08   Dr Rena   PACEMAKER IMPLANT N/A 09/02/2021   Procedure: PACEMAKER IMPLANT;  Surgeon: Cindie Ole DASEN, MD;  Location: [facility] INVASIVE CV LAB;  Service: Cardiovascular;  Laterality: N/A;   RHINOPLASTY  5/10   and septoplasty   SUBACROMIAL DECOMPRESSION Right 2005   Arthroscopic (for rotator cuff and biceps tendon ruptures)   TEAR DUCT PROBING  11/13   Dr Ashley   TOTAL KNEE ARTHROPLASTY Left 09/01/2014   Procedure: TOTAL KNEE ARTHROPLASTY;  Surgeon: Lynwood SHAUNNA Hue, MD;  Location: [facility] ORS;  Service: Orthopedics;  Laterality: Left;   TRIGGER FINGER RELEASE Left 02/25/2015   Procedure: LEFT LONG TRIGGER RELEASE;  Surgeon: Lynwood SHAUNNA Hue, MD;  Location: [facility] ORS;  Service: Orthopedics;  Laterality: Left;   VENOUS ABLATION       Allergies[1]   Outpatient Encounter Medications as of 01/17/2024  Medication Sig   acetaminophen  (TYLENOL ) 500 MG tablet Take 1,000 mg by mouth every 8 (eight) hours as needed.   apixaban  (ELIQUIS ) 2.5 MG TABS tablet Take 2.5 mg by mouth 2 (two) times daily.   APPLE CIDER VINEGAR PO Take 1 capsule by mouth at bedtime.   atorvastatin  (LIPITOR ) 80 MG tablet Take 1 tablet (80 mg total) by mouth daily.   Cranberry 250 MG TABS Take 1 tablet by mouth 2 (two) times daily.   Cyanocobalamin  (B-12 PO) Take 1,000 mg by mouth daily.   diclofenac Sodium (VOLTAREN) 1 % GEL Apply 4 g topically every 6 (six) hours as needed.   docusate sodium  (COLACE) 100 MG capsule Take 100 mg by mouth 2 (two) times daily.   finasteride  (PROSCAR ) 5 MG tablet Take 1 tablet (5 mg total) by mouth daily.   guaiFENesin  (MUCINEX ) 600 MG 12 hr tablet Take 600 mg by mouth every 8 (eight)  hours as needed.   guaifenesin  (ROBITUSSIN) 100 MG/5ML syrup Take 10 mLs by mouth every 4 (four) hours as needed for cough.   IRON, FERROUS GLUCONATE, PO Take 65 mg by mouth daily.   lactose free nutrition (BOOST) LIQD Take 237 mLs by mouth 3 (three) times daily between meals.   lidocaine  (LIDODERM ) 5 % Place 1 patch onto the skin daily. Remove & Discard patch within 12 hours or as directed by MD   Magnesium  Oxide 400 MG CAPS Take 1 capsule (400 mg total) by mouth daily.   metFORMIN  (GLUCOPHAGE ) 500 MG tablet TAKE 1 TABLET BY MOUTH  TWICE DAILY WITH MEALS   mineral oil-hydrophilic petrolatum (AQUAPHOR) ointment Apply 1 Application topically daily as needed for dry skin. To left lower leg   Polyethyl Glycol-Propyl Glycol (SYSTANE ULTRA  OP) Place 1 drop into both eyes daily as needed (dry eyes).   polyethylene glycol (MIRALAX  / GLYCOLAX ) 17 g packet Take 17 g by mouth daily.   saccharomyces boulardii (FLORASTOR) 250 MG capsule Take 250 mg by mouth daily.   sertraline (ZOLOFT) 25 MG tablet Take 25 mg by mouth daily.   sodium chloride  (OCEAN) 0.65 % SOLN nasal spray Place 2 sprays into both nostrils 2 (two) times daily as needed for congestion.   Sodium Fluoride (PREVIDENT 5000 BOOSTER PLUS) 1.1 % PSTE Place 1 application  onto teeth at bedtime as needed.   tamsulosin  (FLOMAX ) 0.4 MG CAPS capsule Take 2 capsules (0.8 mg total) by mouth at bedtime.   No facility-administered encounter medications on file as of 01/17/2024.     Review of Systems  Constitutional: Negative.   HENT: Negative.    Eyes: Negative.   Respiratory: Negative.    Cardiovascular: Negative.   Gastrointestinal: Negative.   Endocrine: Negative.   Genitourinary: Negative.   Musculoskeletal: Negative.   Allergic/Immunologic: Negative.   Hematological: Negative.   Psychiatric/Behavioral: Negative.    All other systems reviewed and are negative.     Immunization History  Administered Date(s) Administered   Fluad Quad(high  Dose 65+) 09/14/2018   H1N1 12/13/2007   INFLUENZA, HIGH DOSE SEASONAL PF 09/20/2012, 09/07/2017   Influenza Split 09/11/2010   Influenza Whole 09/12/2011   Influenza, Seasonal, Injecte, Preservative Fre 09/08/2007, 09/26/2008, 10/02/2014, 09/18/2015   Influenza,inj,Quad PF,6+ Mos 09/19/2013, 09/21/2015   Influenza-Unspecified 09/07/2017, 09/11/2019, 09/03/2020, 10/19/2021, 10/26/2022, 10/17/2023, 10/17/2023   Moderna Sars-Covid-2 Vaccination 01/18/2019, 02/15/2019, 11/19/2019, 10/17/2020, 06/01/2021   PNEUMOCOCCAL CONJUGATE-20 12/13/2021   Pneumococcal Conjugate-13 09/19/2013   Pneumococcal Polysaccharide-23 09/26/2003, 06/28/2016   Td 08/04/1998   Tdap 06/04/2010, 08/15/2021   Tetanus 08/04/1998   Unspecified SARS-COV-2 Vaccination 06/01/2021, 09/30/2022, 04/14/2023   Zoster Recombinant(Shingrix) 06/29/2022   Zoster, Live 01/16/2004   Pertinent  Health Maintenance Due  Topic Date Due   FOOT EXAM  02/08/2024   HEMOGLOBIN A1C  05/08/2024   OPHTHALMOLOGY EXAM  06/19/2024   Influenza Vaccine  Completed      12/07/2021    8:45 AM 06/28/2022    2:03 PM 08/24/2023    9:14 AM 11/12/2023    1:24 PM 12/08/2023    2:28 PM  Fall Risk  Falls in the past year?  1 1 0 0  Was there an injury with Fall?  1  0  0  0  Fall Risk Category Calculator  3 2 0 0  (RETIRED) Patient Fall Risk Level High fall risk       Patient at Risk for Falls Due to  History of fall(s);Impaired balance/gait;Impaired mobility;Impaired vision History of fall(s);Impaired balance/gait;Impaired mobility Impaired balance/gait History of fall(s);Impaired balance/gait  Fall risk Follow up    Falls evaluation completed Falls evaluation completed     Data saved with a previous flowsheet row definition   Functional Status Survey:     Vitals:   01/17/24 1019  BP: 121/74  Pulse: 71  Resp: 18  Temp: 97.6 F (36.4 C)  SpO2: 98%  Weight: 184 lb 4.8 oz (83.6 kg)  Height: 6' (1.829 m)   Body mass index is 25  kg/m. Physical Exam Vitals and nursing note reviewed.  Constitutional:      General: He is not in acute distress.    Appearance: He is not toxic-appearing.     Comments: He appears his stated age of 89 years old.  Still very lively and interactive.  HENT:     Head: Normocephalic and atraumatic.  Cardiovascular:     Rate and Rhythm: Normal rate and regular rhythm.  Pulmonary:     Effort: Pulmonary effort is normal.     Breath sounds: Normal breath sounds.  Abdominal:     General: Abdomen is flat.  Skin:    General: Skin is warm and dry.     Capillary Refill: Capillary refill takes less than 2 seconds.  Neurological:     Mental Status: He is alert and oriented to person, place, and time.      Labs reviewed: Recent Labs    05/08/23 0000 05/18/23 0000 11/09/23 0000  NA 138 139 137  K 4.2 4.1 4.2  CL 105 105 100  CO2 30* 27* 31*  BUN 26* 30* 33*  CREATININE 0.8 0.7 0.8  CALCIUM  9.9 10.0 10.8*   Recent Labs    05/08/23 0000 11/09/23 0000  AST 12* 13*  ALT 9* 8*  ALKPHOS 64 71  ALBUMIN 3.2* 3.7   Recent Labs    05/08/23 0000 05/15/23 0000 05/23/23 1154 06/05/23 0000 07/11/23 1053 08/24/23 0000 09/11/23 0000 11/09/23 0000 11/16/23 0000  WBC 4.1   < > 5.1   < > 5.5 5.0 7.4 6.0 5.5  NEUTROABS 1,915.00  --   --   --   --  2,560.00  --  3,666.00  --   HGB 6.9*   < > 7.7*   < > 11.0* 10.8* 13.2* 12.2* 11.7*  HCT 24*   < > 25.9*   < > 35.7* 34* 42 38* 37*  MCV  --   --  86.3  --  93.5  --   --   --   --   PLT 98*   < > 177   < > 125* 103* 103* 131* 106*   < > = values in this interval not displayed.   Lab Results  Component Value Date   TSH 0.78 11/09/2023   Lab Results  Component Value Date   HGBA1C 6.5 11/09/2023   Lab Results  Component Value Date   CHOL 95 11/07/2022   HDL 59 11/07/2022   LDLCALC 27 11/07/2022   TRIG 31 (A) 11/07/2022   CHOLHDL 2 02/25/2021     Assessment/Plan Essential hypertension BP currently controlled without any  antihypertensives.  Thrombocytopenia Platelet count hovering around 106,000.  Stable.  No recent bleeding.  Atrial fibrillation, chronic (HCC) Chronic.  He has a history of tachybradycardia syndrome status post permanent pacemaker.  He is not on any rate controlling medications.  Remains on Eliquis  2.5 mg a day for CVA prophylaxis.  BPH with obstruction/lower urinary tract symptoms Continue with finasteride  5 mg daily and Flomax  0.8 mg daily.  Type 2 diabetes, controlled, with neuropathy (HCC) Continue with metformin  500 mg twice daily.  Mixed hyperlipidemia Continue with Lipitor  80 mg daily  Chronic diastolic heart failure (HCC) Patient not on any guideline directed medical therapy.  He is not on any rate controlling medications.  He is not on any as needed or scheduled diuretics.  Last echo from August 2023 showed an LVEF of 55 to 60%.  S/P placement of cardiac pacemaker Chronic.  History of tachybradycardia syndrome and chronic A-fib.  Iron deficiency anemia His iron studies in November 16, 2023 show he is no longer iron deficient.  Hemoglobin is up to 11.7 g/dL.     There are no discontinued medications. Orders Placed This Encounter  Procedures   Iron, TIBC  and Ferritin Panel    This external order was created through the Results Console.   CBC and differential    This external order was created through the Results Console.   CBC    This external order was created through the Results Console.    Camellia Door, DO  [facility] [phone]      [1]  Allergies Allergen Reactions   Lovenox [Enoxaparin] Hives   Proscar  [Finasteride ] Other (See Comments)    Upper body and arm weakness   Latex Rash    Has trouble with BANDAIDS that have been left on for more than 24 hours. Prefers paper tape.   Nickel Rash    Localized rash   Percocet [Oxycodone-Acetaminophen ] Rash   Tape Rash and Other (See Comments)    Sensitivity

## 2024-01-17 NOTE — Assessment & Plan Note (Addendum)
 Patient not on any guideline directed medical therapy.  He is not on any rate controlling medications.  He is not on any as needed or scheduled diuretics.  Last echo from August 2023 showed an LVEF of 55 to 60%.

## 2024-01-17 NOTE — Assessment & Plan Note (Signed)
 Chronic.  History of tachybradycardia syndrome and chronic A-fib.

## 2024-01-17 NOTE — Assessment & Plan Note (Signed)
 His iron studies in November 16, 2023 show he is no longer iron deficient.  Hemoglobin is up to 11.7 g/dL.

## 2024-01-17 NOTE — Assessment & Plan Note (Signed)
 Continue with metformin  500 mg twice daily.

## 2024-01-17 NOTE — Assessment & Plan Note (Signed)
Continue with Lipitor 80 mg daily

## 2024-01-17 NOTE — Assessment & Plan Note (Signed)
 BP currently controlled without any antihypertensives.

## 2024-01-23 ENCOUNTER — Inpatient Hospital Stay: Attending: Oncology

## 2024-01-23 ENCOUNTER — Encounter: Payer: Self-pay | Admitting: Oncology

## 2024-01-23 ENCOUNTER — Inpatient Hospital Stay: Admitting: Oncology

## 2024-02-08 ENCOUNTER — Encounter: Payer: Self-pay | Admitting: Nurse Practitioner

## 2024-02-08 ENCOUNTER — Non-Acute Institutional Stay: Payer: Self-pay | Admitting: Nurse Practitioner

## 2024-02-08 NOTE — Progress Notes (Unsigned)
 " Location:   [place].  Nursing Home Room Number: [facility] [id number] Place of Service:  SNF [phone]) [name], NP  PCP: Laurence Locus, DO  Patient Care Team: Laurence Locus, DO as PCP - General (Internal Medicine) Perla Evalene PARAS, MD as PCP - Cardiology (Cardiology) Cindie Ole DASEN, MD (Inactive) as PCP - Electrophysiology (Clinical Cardiac Electrophysiology) Perla Evalene PARAS, MD as Consulting Physician (Cardiology) Melanee Annah BROCKS, MD as Consulting Physician (Hematology and Oncology) Pa, [facility] (Optometry) Riddle, Suzann, NP as Nurse Practitioner (Clinical Cardiac Electrophysiology)  Extended Emergency Contact Information Primary Emergency Contact: Rocky Alm [facility]) Address: [address]          [place], [place] [address] [place] Phone: [phone] Relation: Son  Goals of care: Advanced Directive information    01/17/2024   10:26 AM  Advanced Directives  Does Patient Have a Medical Advance Directive? Yes  Type of Estate Agent of [place];Living will;Out of facility DNR (pink MOST or yellow form)  Does patient want to make changes to medical advance directive? No - Patient declined  Copy of Healthcare Power of Attorney in Chart? Yes - validated most recent copy scanned in chart (See row information)     Chief Complaint  Patient presents with   Medical Management of Chronic Issues    Medical Management of Chronic Issues.     HPI:  Pt is a 89 y.o. male seen today for medical management of chronic disease.    Past Medical History:  Diagnosis Date   Asthma    Asthma    Atrial flutter (HCC) 2007   Band keratopathy    Benign prostatic hypertrophy    Chronic ear infection    Chronic kidney disease    Chronic prostatitis    Complete heart block (HCC) 09/01/2021   Dental bridge present    permanent - upper   Diabetes mellitus    Elbow stiffness, left    s/p fracture many yrs ago.  arm does not  straighten   Encounter for anticoagulation discussion and counseling 07/21/2010   Fall 12/04/2021   Fecal impaction in rectum (HCC) 10/24/2023   GERD (gastroesophageal reflux disease)    laryngeal involvement   History of CVA (cerebrovascular accident) 07/23/2023   Hyperlipidemia    Hypertension    Obstructive sleep apnea    CPAP-9   Orchitis of right testicle 11/30/2020   Osteoarthrosis, localized, primary, knee    post-traumatic   Prostatitis    Sinus drainage 03/11/2022   Skin cancer    Stroke [facility])    TIA   Tachy-brady syndrome (HCC)    TIA (transient ischemic attack) 12/11/2012   UTI (urinary tract infection)    Ventricular tachycardia-pause dependent 10/16/2012   Past Surgical History:  Procedure Laterality Date   APPENDECTOMY     BELPHAROPTOSIS REPAIR     Dr [name]---didn't resolve weepy eye and eyelid drooping   CARDIOVERSION  04/13/2010   CATARACT EXTRACTION, BILATERAL  2009   Chest pain  8/12   Stress test benign   ESOPHAGEAL DILATION  05/26/2016   Procedure: ESOPHAGEAL DILATION;  Surgeon: Jinny Carmine, MD;  Location: [facility];  Service: Endoscopy;;   ESOPHAGOGASTRODUODENOSCOPY (EGD) WITH PROPOFOL  N/A 05/26/2016   Procedure: ESOPHAGOGASTRODUODENOSCOPY (EGD) WITH PROPOFOL ;  Surgeon: Jinny Carmine, MD;  Location: [facility];  Service: Endoscopy;  Laterality: N/A;  Diabetic - oral meds sleep apnea   EYE SURGERY     FRACTURE SURGERY Left  elbow   KNEE SURGERY  1998   plate after fracture, then removed for infection   left elbow surgery     MASTOIDECTOMY  8/08   Dr Rena   PACEMAKER IMPLANT N/A 09/02/2021   Procedure: PACEMAKER IMPLANT;  Surgeon: Cindie Ole DASEN, MD;  Location: [facility];  Service: Cardiovascular;  Laterality: N/A;   RHINOPLASTY  5/10   and septoplasty   SUBACROMIAL DECOMPRESSION Right 2005   Arthroscopic (for rotator cuff and biceps tendon ruptures)   TEAR DUCT PROBING  11/13   Dr Ashley   TOTAL KNEE  ARTHROPLASTY Left 09/01/2014   Procedure: TOTAL KNEE ARTHROPLASTY;  Surgeon: Lynwood SHAUNNA Hue, MD;  Location: [facility] ORS;  Service: Orthopedics;  Laterality: Left;   TRIGGER FINGER RELEASE Left 02/25/2015   Procedure: LEFT LONG TRIGGER RELEASE;  Surgeon: Lynwood SHAUNNA Hue, MD;  Location: [facility] OR[facility];  Service: Orthopedics;  Laterality: Left;   VENOUS ABLATION      Allergies[1]  Outpatient Encounter Medications as of 02/08/2024  Medication Sig   acetaminophen  (TYLENOL ) 500 MG tablet Take 1,000 mg by mouth every 8 (eight) hours as needed.   apixaban  (ELIQUIS ) 2.5 MG TABS tablet Take 2.5 mg by mouth 2 (two) times daily.   APPLE CIDER VINEGAR PO Take 1 capsule by mouth at bedtime.   atorvastatin  (LIPITOR ) 80 MG tablet Take 1 tablet (80 mg total) by mouth daily.   Cranberry 250 MG TABS Take 1 tablet by mouth 2 (two) times daily.   Cyanocobalamin  (B-12 PO) Take 1,000 mg by mouth daily.   diclofenac Sodium (VOLTAREN) 1 % GEL Apply 4 g topically every 6 (six) hours as needed.   docusate sodium  (COLACE) 100 MG capsule Take 100 mg by mouth 2 (two) times daily.   finasteride  (PROSCAR ) 5 MG tablet Take 1 tablet (5 mg total) by mouth daily.   guaiFENesin  (MUCINEX ) 600 MG 12 hr tablet Take 600 mg by mouth every 8 (eight) hours as needed.   guaifenesin  (ROBITUSSIN) 100 MG/5ML syrup Take 10 mLs by mouth every 4 (four) hours as needed for cough.   IRON, FERROUS GLUCONATE, PO Take 65 mg by mouth daily.   lactose free nutrition (BOOST) LIQD Take 237 mLs by mouth 3 (three) times daily between meals.   lidocaine  (LIDODERM ) 5 % Place 1 patch onto the skin daily. Remove & Discard patch within 12 hours or as directed by MD   Magnesium  Oxide 400 MG CAPS Take 1 capsule (400 mg total) by mouth daily.   metFORMIN  (GLUCOPHAGE ) 500 MG tablet TAKE 1 TABLET BY MOUTH  TWICE DAILY WITH MEALS   mineral oil-hydrophilic petrolatum (AQUAPHOR) ointment Apply 1 Application topically daily as needed for dry skin. To left lower leg   Polyethyl  Glycol-Propyl Glycol (SYSTANE ULTRA OP) Place 1 drop into both eyes daily as needed (dry eyes).   polyethylene glycol (MIRALAX  / GLYCOLAX ) 17 g packet Take 17 g by mouth daily.   saccharomyces boulardii (FLORASTOR) 250 MG capsule Take 250 mg by mouth daily.   sertraline (ZOLOFT) 25 MG tablet Take 25 mg by mouth daily.   sodium chloride  (OCEAN) 0.65 % SOLN nasal spray Place 2 sprays into both nostrils 2 (two) times daily as needed for congestion.   Sodium Fluoride (PREVIDENT 5000 BOOSTER PLUS) 1.1 % PSTE Place 1 application  onto teeth at bedtime as needed.   tamsulosin  (FLOMAX ) 0.4 MG CAPS capsule Take 2 capsules (0.8 mg total) by mouth at bedtime.   No facility-administered encounter medications on  file as of 02/08/2024.    Review of Systems ***  Immunization History  Administered Date(s) Administered   Fluad Quad(high Dose 65+) 09/14/2018   H1N1 12/13/2007   INFLUENZA, HIGH DOSE SEASONAL PF 09/20/2012, 09/07/2017   Influenza Split 09/11/2010   Influenza Whole 09/12/2011   Influenza, Seasonal, Injecte, Preservative Fre 09/08/2007, 09/26/2008, 10/02/2014, 09/18/2015   Influenza,inj,Quad PF,6+ Mos 09/19/2013, 09/21/2015   Influenza-Unspecified 09/07/2017, 09/11/2019, 09/03/2020, 10/19/2021, 10/26/2022, 10/17/2023, 10/17/2023   Moderna Sars-Covid-2 Vaccination 01/18/2019, 02/15/2019, 11/19/2019, 10/17/2020, 06/01/2021   PNEUMOCOCCAL CONJUGATE-20 12/13/2021   Pneumococcal Conjugate-13 09/19/2013   Pneumococcal Polysaccharide-23 09/26/2003, 06/28/2016   Td 08/04/1998   Tdap 06/04/2010, 08/15/2021   Tetanus 08/04/1998   Unspecified SARS-COV-2 Vaccination 06/01/2021, 09/30/2022, 04/14/2023   Zoster Recombinant(Shingrix) 06/29/2022   Zoster, Live 01/16/2004   Pertinent  Health Maintenance Due  Topic Date Due   FOOT EXAM  02/08/2024   HEMOGLOBIN A1C  05/08/2024   OPHTHALMOLOGY EXAM  06/19/2024   Influenza Vaccine  Completed      12/07/2021    8:45 AM 06/28/2022    2:03 PM 08/24/2023     9:14 AM 11/12/2023    1:24 PM 12/08/2023    2:28 PM  Fall Risk  Falls in the past year?  1 1 0 0  Was there an injury with Fall?  1  0  0  0  Fall Risk Category Calculator  3 2 0 0  (RETIRED) Patient Fall Risk Level High fall risk       Patient at Risk for Falls Due to  History of fall(s);Impaired balance/gait;Impaired mobility;Impaired vision History of fall(s);Impaired balance/gait;Impaired mobility Impaired balance/gait History of fall(s);Impaired balance/gait  Fall risk Follow up    Falls evaluation completed Falls evaluation completed     Data saved with a previous flowsheet row definition   Functional Status Survey:    Vitals:   02/08/24 0910  BP: 125/81  Pulse: 62  Resp: 20  Temp: 98 F (36.7 C)  SpO2: 93%  Weight: 185 lb 3.2 oz (84 kg)  Height: 6' (1.829 m)   Body mass index is 25.12 kg/m. Physical Exam***  Labs reviewed: Recent Labs    05/08/23 0000 05/18/23 0000 11/09/23 0000  NA 138 139 137  K 4.2 4.1 4.2  CL 105 105 100  CO2 30* 27* 31*  BUN 26* 30* 33*  CREATININE 0.8 0.7 0.8  CALCIUM  9.9 10.0 10.8*   Recent Labs    05/08/23 0000 11/09/23 0000  AST 12* 13*  ALT 9* 8*  ALKPHOS 64 71  ALBUMIN 3.2* 3.7   Recent Labs    05/08/23 0000 05/15/23 0000 05/23/23 1154 06/05/23 0000 07/11/23 1053 08/24/23 0000 09/11/23 0000 11/09/23 0000 11/16/23 0000  WBC 4.1   < > 5.1   < > 5.5 5.0 7.4 6.0 5.5  NEUTROABS 1,915.00  --   --   --   --  2,560.00  --  3,666.00  --   HGB 6.9*   < > 7.7*   < > 11.0* 10.8* 13.2* 12.2* 11.7*  HCT 24*   < > 25.9*   < > 35.7* 34* 42 38* 37*  MCV  --   --  86.3  --  93.5  --   --   --   --   PLT 98*   < > 177   < > 125* 103* 103* 131* 106*   < > = values in this interval not displayed.   Lab Results  Component Value Date  TSH 0.78 11/09/2023   Lab Results  Component Value Date   HGBA1C 6.5 11/09/2023   Lab Results  Component Value Date   CHOL 95 11/07/2022   HDL 59 11/07/2022   LDLCALC 27 11/07/2022   TRIG  31 (A) 11/07/2022   CHOLHDL 2 02/25/2021    Significant Diagnostic Results in last 30 days:  No results found.  Assessment/Plan No problem-specific Assessment & Plan notes found for this encounter.     Jessica K. Caro BODILY [facility] [phone]      [1]  Allergies Allergen Reactions   Lovenox [Enoxaparin] Hives   Proscar  [Finasteride ] Other (See Comments)    Upper body and arm weakness   Latex Rash    Has trouble with BANDAIDS that have been left on for more than 24 hours. Prefers paper tape.   Nickel Rash    Localized rash   Percocet [Oxycodone-Acetaminophen ] Rash   Tape Rash and Other (See Comments)    Sensitivity    "
